# Patient Record
Sex: Male | Born: 1940
Health system: Southern US, Community
[De-identification: ages and names within clinical notes are randomized; demographics above are authoritative.]

## PROBLEM LIST (undated history)

## (undated) DIAGNOSIS — T8859XA Other complications of anesthesia, initial encounter: Secondary | ICD-10-CM

## (undated) DIAGNOSIS — E785 Hyperlipidemia, unspecified: Secondary | ICD-10-CM

## (undated) DIAGNOSIS — Z9981 Dependence on supplemental oxygen: Secondary | ICD-10-CM

## (undated) DIAGNOSIS — J310 Chronic rhinitis: Secondary | ICD-10-CM

## (undated) DIAGNOSIS — J449 Chronic obstructive pulmonary disease, unspecified: Secondary | ICD-10-CM

## (undated) DIAGNOSIS — J342 Deviated nasal septum: Secondary | ICD-10-CM

## (undated) DIAGNOSIS — R351 Nocturia: Secondary | ICD-10-CM

## (undated) DIAGNOSIS — R0600 Dyspnea, unspecified: Secondary | ICD-10-CM

## (undated) DIAGNOSIS — G4733 Obstructive sleep apnea (adult) (pediatric): Principal | ICD-10-CM

## (undated) DIAGNOSIS — I10 Essential (primary) hypertension: Secondary | ICD-10-CM

## (undated) DIAGNOSIS — M1990 Unspecified osteoarthritis, unspecified site: Secondary | ICD-10-CM

## (undated) DIAGNOSIS — C4490 Unspecified malignant neoplasm of skin, unspecified: Secondary | ICD-10-CM

## (undated) HISTORY — DX: Obstructive sleep apnea (adult) (pediatric): G47.33

## (undated) HISTORY — DX: Chronic obstructive pulmonary disease, unspecified: J44.9

## (undated) HISTORY — DX: Chronic rhinitis: J31.0

## (undated) HISTORY — PX: APPENDECTOMY: SHX54

## (undated) HISTORY — DX: Essential (primary) hypertension: I10

## (undated) HISTORY — PX: BACK SURGERY: SHX140

## (undated) HISTORY — DX: Deviated nasal septum: J34.2

## (undated) HISTORY — DX: Unspecified malignant neoplasm of skin, unspecified: C44.90

## (undated) HISTORY — DX: Hyperlipidemia, unspecified: E78.5

## (undated) HISTORY — DX: Unspecified osteoarthritis, unspecified site: M19.90

---

## 1999-04-08 ENCOUNTER — Encounter: Payer: Self-pay | Admitting: Internal Medicine

## 1999-04-08 ENCOUNTER — Ambulatory Visit (HOSPITAL_COMMUNITY): Admission: RE | Admit: 1999-04-08 | Discharge: 1999-04-08 | Payer: Self-pay | Admitting: Internal Medicine

## 2002-02-22 ENCOUNTER — Ambulatory Visit (HOSPITAL_BASED_OUTPATIENT_CLINIC_OR_DEPARTMENT_OTHER): Admission: RE | Admit: 2002-02-22 | Discharge: 2002-02-22 | Payer: Self-pay | Admitting: Surgery

## 2004-08-13 ENCOUNTER — Ambulatory Visit: Payer: Self-pay | Admitting: Internal Medicine

## 2004-08-28 ENCOUNTER — Ambulatory Visit: Payer: Self-pay | Admitting: Internal Medicine

## 2004-08-29 ENCOUNTER — Ambulatory Visit: Payer: Self-pay | Admitting: Internal Medicine

## 2005-06-12 ENCOUNTER — Emergency Department (HOSPITAL_COMMUNITY): Admission: EM | Admit: 2005-06-12 | Discharge: 2005-06-12 | Payer: Self-pay | Admitting: Emergency Medicine

## 2005-06-16 ENCOUNTER — Emergency Department (HOSPITAL_COMMUNITY): Admission: EM | Admit: 2005-06-16 | Discharge: 2005-06-16 | Payer: Self-pay | Admitting: Emergency Medicine

## 2005-06-22 ENCOUNTER — Emergency Department (HOSPITAL_COMMUNITY): Admission: EM | Admit: 2005-06-22 | Discharge: 2005-06-22 | Payer: Self-pay | Admitting: Emergency Medicine

## 2005-09-16 ENCOUNTER — Ambulatory Visit: Payer: Self-pay | Admitting: Family Medicine

## 2005-10-02 ENCOUNTER — Ambulatory Visit: Payer: Self-pay | Admitting: Internal Medicine

## 2005-10-27 ENCOUNTER — Ambulatory Visit (HOSPITAL_BASED_OUTPATIENT_CLINIC_OR_DEPARTMENT_OTHER): Admission: RE | Admit: 2005-10-27 | Discharge: 2005-10-27 | Payer: Self-pay | Admitting: General Surgery

## 2005-10-27 ENCOUNTER — Encounter (INDEPENDENT_AMBULATORY_CARE_PROVIDER_SITE_OTHER): Payer: Self-pay | Admitting: *Deleted

## 2006-02-03 ENCOUNTER — Ambulatory Visit: Payer: Self-pay | Admitting: Internal Medicine

## 2006-09-29 ENCOUNTER — Ambulatory Visit: Payer: Self-pay | Admitting: Internal Medicine

## 2006-10-20 ENCOUNTER — Ambulatory Visit: Payer: Self-pay | Admitting: Internal Medicine

## 2006-11-09 ENCOUNTER — Ambulatory Visit: Payer: Self-pay | Admitting: Internal Medicine

## 2008-01-25 ENCOUNTER — Emergency Department (HOSPITAL_COMMUNITY): Admission: EM | Admit: 2008-01-25 | Discharge: 2008-01-25 | Payer: Self-pay | Admitting: Emergency Medicine

## 2008-03-09 LAB — HM COLONOSCOPY

## 2009-06-07 ENCOUNTER — Ambulatory Visit: Payer: Self-pay | Admitting: Cardiovascular Disease

## 2009-06-07 DIAGNOSIS — I10 Essential (primary) hypertension: Secondary | ICD-10-CM | POA: Insufficient documentation

## 2009-06-26 ENCOUNTER — Ambulatory Visit: Payer: Self-pay

## 2009-06-26 ENCOUNTER — Encounter: Payer: Self-pay | Admitting: Cardiovascular Disease

## 2009-06-26 ENCOUNTER — Ambulatory Visit: Payer: Self-pay | Admitting: Internal Medicine

## 2009-06-29 ENCOUNTER — Telehealth: Payer: Self-pay | Admitting: Cardiovascular Disease

## 2009-06-29 ENCOUNTER — Encounter: Payer: Self-pay | Admitting: Cardiovascular Disease

## 2009-07-05 ENCOUNTER — Ambulatory Visit: Payer: Self-pay | Admitting: Pulmonary Disease

## 2009-07-05 DIAGNOSIS — J439 Emphysema, unspecified: Secondary | ICD-10-CM | POA: Insufficient documentation

## 2009-08-02 ENCOUNTER — Ambulatory Visit: Payer: Self-pay | Admitting: Pulmonary Disease

## 2009-08-02 DIAGNOSIS — J310 Chronic rhinitis: Secondary | ICD-10-CM | POA: Insufficient documentation

## 2009-08-28 ENCOUNTER — Encounter: Payer: Self-pay | Admitting: Pulmonary Disease

## 2009-09-18 ENCOUNTER — Ambulatory Visit: Payer: Self-pay | Admitting: Pulmonary Disease

## 2009-10-03 ENCOUNTER — Ambulatory Visit: Payer: Self-pay | Admitting: Pulmonary Disease

## 2009-10-03 DIAGNOSIS — R490 Dysphonia: Secondary | ICD-10-CM | POA: Insufficient documentation

## 2010-11-07 ENCOUNTER — Ambulatory Visit: Payer: Self-pay | Admitting: Pulmonary Disease

## 2011-01-02 NOTE — Assessment & Plan Note (Signed)
Summary: ROV PER PT'S SISTER///KP   Visit Type:  Follow-up Copy to:  [name]/Referring Melissa Tomaselli:  [facility]--Dr. Durwin Nora  CC:  COPD...pt c/o incr SOB w/ exertion...prod cough w/ yellow sputum...wheezing...sinus drainage...pt has been off all inahlers for >6 months.  History of Present Illness: 70 yo male with  COPD and rhinitis.  He has been feeling more short of breath.  He has more cough with clear to yellow sputum.  He has been getting some wheezing.  He denies fever or hemoptysis.  He was not able to tolerate spiriva or symbicort because these caused throat irritation.  He has been using proair and this helps.  He was recent treated with antibiotics for a sinus infection.  Preventive Screening-Counseling & Management  Alcohol-Tobacco     Smoking Status: quit  Current Medications (verified): 1)  No Daily Medications  Allergies (verified): 1)  ! Pcn 2)  ! Asa 3)  ! * Amlodipine  Past History:  Past Medical History: Reviewed history from 10/03/2009 and no changes required. COPD      - PFT 06/26/09 FEV1 1.75(71%), FVC 3.65(101%), FEV1% 48, TLC 5.53(104%), DLCO 55%, no BD Athritis Skin Cancer Hypertension Nasal septal deviation Chronic rhinitis  Past Surgical History: Reviewed history from 06/07/2009 and no changes required. Back Surgery 1980's Appendectomy  Vital Signs:  Patient profile:   70 year old male Height:      65 inches (165.10 cm) Weight:      164 pounds (74.55 kg) BMI:     27.39 O2 Sat:      94 % on Room air Temp:     97.6 degrees F (36.44 degrees C) oral Pulse rate:   59 / minute BP sitting:   150 / 88  (left arm) Cuff size:   regular  Vitals Entered By: Michel Bickers CMA (November 07, 2010 10:30 AM)  O2 Sat at Rest %:  94 O2 Flow:  Room air CC: COPD...pt c/o incr SOB w/ exertion...prod cough w/ yellow sputum...wheezing...sinus drainage...pt has been off all inahlers for >6 months Comments Medications  reviewed with patient Michel Bickers [facility]  November 07, 2010 10:31 AM   Physical Exam  General:  normal appearance and healthy appearing.   Nose:  clear nasal discharge and deviated septum.   Mouth:  edentulous, 1+ tonsills, no exudate Neck:  no JVD, no thyromegaly Lungs:  diminished breath sounds, no wheezing or rales Heart:  regular rate and rhythm, S1, S2 without murmurs, rubs, gallops, or clicks Extremities:  no clubbing, cyanosis, edema, or deformity noted Neurologic:  normal CN II-XII and strength normal.   Cervical Nodes:  no significant adenopathy Psych:  alert and cooperative; normal mood and affect; normal attention span and concentration   Impression & Recommendations:  Problem # 1:  COPD (ICD-496) He was not able to tolerate symbicort or spiriva.  Will try him on combivent as needed.  I don't think he needs additional antibiotics or prednisone at this time.  Problem # 2:  RHINITIS (ICD-472.0)  He is not on any therapy at present.  Advised him to resume nasal irrigation as needed.  Problem # 3:  HOARSENESS (AYT-016.01) Will monitor off symbicort and spiriva.  Monitor his sinus symptoms.  Hold off on therapy for reflux for now.  He can continue salt water gargles as needed.  Medications Added to Medication List This Visit: 1)  No Daily Medications  2)  Combivent 18-103 Mcg/act Aero (Ipratropium-albuterol) .... Two puffs four  times per day as needed  Complete Medication List: 1)  Combivent 18-103 Mcg/act Aero (Ipratropium-albuterol) .... Two puffs four times per day as needed  Other Orders: Est. Patient Level III (04540)  Patient Instructions: 1)  Combivent two puffs up to four times per day as needed 2)  Follow up in 4 months Prescriptions: COMBIVENT 18-103 MCG/ACT AERO (IPRATROPIUM-ALBUTEROL) two puffs four times per day as needed  #1 x 6   Entered and Authorized by:   Coralyn Helling MD   Signed by:   Coralyn Helling MD on 11/07/2010   Method used:   Electronically to          [facility] [phone]* (retail)       [address]       [place], [place]  [address]       Ph: [phone] or [phone]       Fax: [phone]   RxID:   [id number]

## 2011-02-26 ENCOUNTER — Encounter: Payer: Self-pay | Admitting: *Deleted

## 2011-02-26 ENCOUNTER — Ambulatory Visit (INDEPENDENT_AMBULATORY_CARE_PROVIDER_SITE_OTHER)
Admission: RE | Admit: 2011-02-26 | Discharge: 2011-02-26 | Disposition: A | Discharge status: Home / Self Care | Payer: Medicare Other | Source: Ambulatory Visit | Attending: Pulmonary Disease | Admitting: Pulmonary Disease

## 2011-02-26 ENCOUNTER — Ambulatory Visit (INDEPENDENT_AMBULATORY_CARE_PROVIDER_SITE_OTHER): Payer: Medicare Other | Admitting: Pulmonary Disease

## 2011-02-26 ENCOUNTER — Encounter: Payer: Self-pay | Admitting: Pulmonary Disease

## 2011-02-26 VITALS — BP 150/94 | HR 73 | Temp 98.1°F | Ht 65.0 in | Wt 155.4 lb

## 2011-02-26 DIAGNOSIS — J441 Chronic obstructive pulmonary disease with (acute) exacerbation: Secondary | ICD-10-CM

## 2011-02-26 DIAGNOSIS — J449 Chronic obstructive pulmonary disease, unspecified: Secondary | ICD-10-CM

## 2011-02-26 MED ORDER — PREDNISONE 10 MG PO TABS: ORAL_TABLET | ORAL | Status: DC

## 2011-02-26 NOTE — Progress Notes (Signed)
  Subjective:    Patient ID: DWON SKY, male    DOB: 09/27/1941, 70 y.o.   MRN: [id number]  HPI 68/M with COPD , gold stg 2 FEv1 70% & rhinits  02/26/2011 Increased sob x 2-3 weeks, cough with white cotton, thinks due to high pollen No preceding URI, fevers, sore throat, no sick contacts.     Review of Systems Pt denies any significant  nasal congestion or excess secretions, fever, chills, sweats, unintended wt loss, pleuritic or exertional cp, orthopnea pnd or leg swelling.  Pt also denies any obvious fluctuation in symptoms with weather or environmental change or other alleviating or aggravating factors.    Pt denies any increase in rescue therapy over baseline, denies waking up needing it or having early am exacerbations or coughing/wheezing/ or dyspnea       Objective:   Physical Exam Gen. Pleasant, thin male, in no distress, normal affect ENT - no lesions, no post nasal drip Neck: No JVD, no thyromegaly, no carotid bruits Lungs: no use of accessory muscles, no dullness to percussion, decreased without rales, faint exp rhonchi  Cardiovascular: Rhythm regular, heart sounds  normal, no murmurs or gallops, no peripheral edema Abdomen: soft and non-tender, no hepatosplenomegaly, BS normal. Musculoskeletal: No deformities, no cyanosis or clubbing Neuro:  alert, non focal         Assessment & Plan:

## 2011-02-26 NOTE — Patient Instructions (Signed)
Chest xray today Take prednisone 10 mg tabs - 2 tabs x 5 days, then 1 tab x 5 ds then STOP Take CLARITIN or ZYRTEC OTC  Daily x 2 months Symbicort 160 sample 2 puffs bid until over This should help reduce the frequency of your rescue inhaler (COMBIVENT) to 2 puffs as needed only

## 2011-02-27 ENCOUNTER — Encounter: Payer: Self-pay | Admitting: Pulmonary Disease

## 2011-02-27 NOTE — Assessment & Plan Note (Signed)
Flare related to pollen? Chest xray today Take prednisone 10 mg tabs - 2 tabs x 5 days, then 1 tab x 5 ds then STOP Take CLARITIN or ZYRTEC OTC  Daily x 2 months Symbicort 160 sample 2 puffs bid until over - Reassess in 4 weeks whether he would need LABA longer This should help reduce the frequency of your rescue inhaler

## 2011-03-28 ENCOUNTER — Ambulatory Visit (INDEPENDENT_AMBULATORY_CARE_PROVIDER_SITE_OTHER): Payer: Medicare Other | Admitting: Pulmonary Disease

## 2011-03-28 ENCOUNTER — Encounter: Payer: Self-pay | Admitting: Pulmonary Disease

## 2011-03-28 VITALS — BP 162/88 | HR 59 | Temp 97.7°F | Ht 65.0 in | Wt 156.6 lb

## 2011-03-28 DIAGNOSIS — J449 Chronic obstructive pulmonary disease, unspecified: Secondary | ICD-10-CM

## 2011-03-28 DIAGNOSIS — J310 Chronic rhinitis: Secondary | ICD-10-CM

## 2011-03-28 DIAGNOSIS — R490 Dysphonia: Secondary | ICD-10-CM

## 2011-03-28 NOTE — Progress Notes (Signed)
Subjective:    Patient ID: Michael Evans, male    DOB: 1941-11-25, 70 y.o.   MRN: [id number] Copy to: [name], Dr. [facility]  HPI 70 yo male with GOLD 2 COPD and rhinitis.  He finished his course of prednisone.  He is using symbicort twice per day.  He uses this at 12pm and 5pm.  He is using combivent 4 times per day.  This helps, but does not last.  His breathing is getting worse, especially at night.  He is having more cough and clear sputum.  He is wheezing at night.  He denies chest pain/tightness, hemoptysis.  He is not smoking.  He felt better when on prednisone.  He thinks his allergies are getting worse.  He is using OTC allergy medicine recommended by his pharmacist, and this has helped.  Past Medical History  Diagnosis Date  . COPD (chronic obstructive pulmonary disease)     PFT 06-26-09 FEV1  1.75( 71%), FVC 3.65( 101%), FEV1% 48, TLC 5.53(104%), DLCO 55%, no BD  . Arthritis   . Skin cancer   . Hypertension   . Nasal septal deviation   . Chronic rhinitis      Family History  Problem Relation Age of Onset  . Lung cancer Mother      History   Social History  . Marital Status: Divorced    Spouse Name: N/A    Number of Children: N/A  . 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 Smoking status: Former Smoker -- 1.5 packs/day for 54 years    Quit date: 12/01/2001  . Smokeless tobacco: Not on file  . Alcohol Use: No  . Drug Use: No  . Sexually Active: Not on file   Other Topics Concern  . Not on file   Social History Narrative  . No narrative on file     Allergies  Allergen Reactions  . Aspirin   . Penicillins   . Sulfa Antibiotics      Outpatient Prescriptions Prior to Visit  Medication Sig Dispense Refill  . albuterol-ipratropium (COMBIVENT) 18-103 MCG/ACT inhaler Inhale 2 puffs into the lungs every 6 (six) hours as needed.        . predniSONE (DELTASONE) 10 MG tablet 2  tablets by mouth once daily x 5 days, then 1 tablet by mouth x 5 days then stop  15 tablet  0      Review of Systems     Objective:   Physical Exam Filed Vitals:   03/28/11 0955 03/28/11 0956  BP:  162/88  Pulse:  59  Temp: 97.7 F (36.5 C)   TempSrc: Oral   Height: 5\' 5"  (1.651 m)   Weight: 156 lb 9.6 oz (71.033 kg)   SpO2:  94%   General - Thin, no distress HEENT - No sinus tenderness, clear nasal discharge, no oral lesion, no LAN Cardiac - s1s2 regular, no murmur Chest - diminished breath sound, no wheeze or rales Abd - soft, nontender Ext - no edema Neuro - normal strength, CN intact Psych - normal mood/behavior     Assessment & Plan:   COPD I am concerned part of his nocturnal dyspnea may be related to the timing of his inhaler use.  I explained that he is likely using symbicort too close together.  Advised him to use symbicort at 8am and 8pm.  Will also arrange for overnight oximetry  on room air.  RHINITIS Much of his current symptoms seem to be related to his sinuses.  Advised him to use nasal irrigation and prn anti-histamine.    Updated Medication List Outpatient Encounter Prescriptions as of 03/28/2011  Medication Sig Dispense Refill  . albuterol-ipratropium (COMBIVENT) 18-103 MCG/ACT inhaler Inhale 2 puffs into the lungs every 6 (six) hours as needed.        . budesonide-formoterol (SYMBICORT) 160-4.5 MCG/ACT inhaler Inhale 2 puffs into the lungs 2 (two) times daily.        . Chlorpheniramine-Phenylephrine (SINUS & ALLERGY PE MAX ST) 4-10 MG per tablet 1 tablet twice a day       . DISCONTD: predniSONE (DELTASONE) 10 MG tablet 2 tablets by mouth once daily x 5 days, then 1 tablet by mouth x 5 days then stop  15 tablet  0

## 2011-03-28 NOTE — Assessment & Plan Note (Signed)
I am concerned part of his nocturnal dyspnea may be related to the timing of his inhaler use.  I explained that he is likely using symbicort too close together.  Advised him to use symbicort at 8am and 8pm.  Will also arrange for overnight oximetry on room air.

## 2011-03-28 NOTE — Patient Instructions (Signed)
Use symbicort at 8 am and 8 pm Use nasal irrigation as needed for sinuses Will arrange oxygen test at night Follow up in 4 months

## 2011-03-28 NOTE — Assessment & Plan Note (Addendum)
Much of his current symptoms seem to be related to his sinuses.  Advised him to use nasal irrigation and prn anti-histamine.

## 2011-04-18 NOTE — Op Note (Signed)
[place]  Patient:    Michael Evans, Michael Evans Visit Number: [id number] MRN: [id number]          Service Type: DSU Location: [facility] Attending Physician:  Shelly Rubenstein Dictated by:   Abigail Miyamoto, M.D. Proc. Date: 02/22/02 Admit Date:  02/22/2002                             Operative Report  PREOPERATIVE DIAGNOSIS:  Skin lesion, right hand, right lower back.  POSTOPERATIVE DIAGNOSIS:  Skin lesion, right hand, right lower back.  OPERATION PERFORMED:  Excision of skin lesion, right hand and right lower back.  SURGEON:  Abigail Miyamoto, M.D.  ANESTHESIA:  1% lidocaine.  DESCRIPTION OF PROCEDURE:  Patient brought to operating room and identified as Michael Evans.  He was placed supine on the operating table and then placed in the prone position.  The skin of the back was then prepped and draped in the usual sterile fashion.  The skin overlying the lesion was anesthetized with 1% lidocaine.  A longitudinal incision was then made over the lesion and the lesion was completely excised.  It appeared to be consistent with a recurrent sebaceous cyst.  The entire cyst contents and capsule were completely removed. The wound was then irrigated and closed with two 3-0 nylon sutures.  The patient was then placed back in supine position.  His right hand was then prepped and draped in the usual sterile fashion.  The skin on the hand was then anesthetized with 1% lidocaine.  An elliptical incision was made around the 5 mm skin lesion.  The lesion was then completely excised in its entirety and sent to pathology for identification.  The wound was then closed with interrupted 3-0 nylon sutures.  A band-aid was then placed on both wounds. The patient tolerated the procedure well.  All sponge, needle and instrument counts were correct at the end of the procedure.  The patient was able to leave the operating room and go to the recovery room. Dictated by:   Abigail Miyamoto, M.D. Attending Physician:  Shelly Rubenstein DD:  02/22/02 TD:  02/22/02 Job: [id number]

## 2011-04-18 NOTE — Op Note (Signed)
Michael Evans, Michael Evans                 ACCOUNT NO.:  [id number]   MEDICAL RECORD NO.:  [id number]          PATIENT TYPE:  AMB   LOCATION:  DSC                          FACILITY:  [facility]   PHYSICIAN:  Ollen Gross. Vernell Morgans, M.D. DATE OF BIRTH:  1941/07/09   DATE OF PROCEDURE:  10/27/2005  DATE OF DISCHARGE:                                 OPERATIVE REPORT   PREOPERATIVE DIAGNOSIS:  Sebaceous cyst on the left low back.   POSTOPERATIVE DIAGNOSIS:  Sebaceous cyst on the left low back.   PROCEDURE:  Excision of 1-cm sebaceous cyst from the left low back.   SURGEON:  Ollen Gross. Carolynne Edouard, M.D.   ANESTHESIA:  Local.   PROCEDURE:  After informed consent was obtained, the patient was brought to  the operating room and placed in a prone position on the operating room  table.  The area in question on his left low back was then prepped with  Betadine and draped in the usual sterile manner.  The area was then  infiltrated with 1% lidocaine with epinephrine until a good field block was  created.  The sebaceous cyst was then excised in an elliptical fashion  sharply with a 15 blade knife full-thickness down into the subcutaneous  fatty tissue until the sebaceous cyst was completely removed.  No evidence  of cyst wall remained.  This was then sent to Pathology for further  evaluation.  The wound was then closed with interrupted 3-0 nylon stitches  and bacitracin, and sterile dressings were applied.  The area was completely  hemostatic at the end.  The patient tolerated well the procedure well.  All  needle, sponge and instrument counts were correct.  The patient was then  taken to recovery in stable condition.      Ollen Gross. Vernell Morgans, M.D.  Electronically Signed     PST/MEDQ  D:  10/27/2005  T:  10/28/2005  Job:  [id number]

## 2011-04-21 ENCOUNTER — Telehealth: Payer: Self-pay | Admitting: Pulmonary Disease

## 2011-04-21 DIAGNOSIS — G4733 Obstructive sleep apnea (adult) (pediatric): Secondary | ICD-10-CM

## 2011-04-21 DIAGNOSIS — G4710 Hypersomnia, unspecified: Secondary | ICD-10-CM

## 2011-04-21 HISTORY — DX: Obstructive sleep apnea (adult) (pediatric): G47.33

## 2011-04-21 NOTE — Telephone Encounter (Signed)
Overnight oximetry from 04/08/11>>Test time 10hrs .  Mean SpO2 91%, low 82%.  Spent 41 min with SpO2 < 88%.  Had REM related oxygen desaturation pattern.  He has sleep disruption, daytime fatigue, and snoring.  Has history of hypertension.  Explained he could have oxygen desaturation due to COPD and/or OSA.  To further assess will have [facility] contact him to schedule sleep test.

## 2011-04-29 ENCOUNTER — Encounter: Payer: Self-pay | Admitting: Pulmonary Disease

## 2011-05-20 ENCOUNTER — Ambulatory Visit (HOSPITAL_BASED_OUTPATIENT_CLINIC_OR_DEPARTMENT_OTHER): Payer: Medicare Other | Attending: Pulmonary Disease

## 2011-05-20 DIAGNOSIS — G4733 Obstructive sleep apnea (adult) (pediatric): Secondary | ICD-10-CM | POA: Insufficient documentation

## 2011-05-20 DIAGNOSIS — J449 Chronic obstructive pulmonary disease, unspecified: Secondary | ICD-10-CM | POA: Insufficient documentation

## 2011-05-20 DIAGNOSIS — R259 Unspecified abnormal involuntary movements: Secondary | ICD-10-CM | POA: Insufficient documentation

## 2011-05-20 DIAGNOSIS — G4710 Hypersomnia, unspecified: Secondary | ICD-10-CM

## 2011-05-20 DIAGNOSIS — J4489 Other specified chronic obstructive pulmonary disease: Secondary | ICD-10-CM | POA: Insufficient documentation

## 2011-05-26 ENCOUNTER — Telehealth: Payer: Self-pay | Admitting: Pulmonary Disease

## 2011-05-26 NOTE — Telephone Encounter (Signed)
Spoke with the patient sister and she states the pt had a sleep test done on 05-20-11. He is still having a lot of problems with trouble sleeping and the coughing at night. They want to know the results of sleep test. They are aware that VS will be in office on Wed.  Please advise. Carron Curie, CMA

## 2011-05-31 ENCOUNTER — Telehealth: Payer: Self-pay | Admitting: Pulmonary Disease

## 2011-05-31 DIAGNOSIS — J449 Chronic obstructive pulmonary disease, unspecified: Secondary | ICD-10-CM

## 2011-05-31 DIAGNOSIS — G4733 Obstructive sleep apnea (adult) (pediatric): Secondary | ICD-10-CM

## 2011-05-31 DIAGNOSIS — J4489 Other specified chronic obstructive pulmonary disease: Secondary | ICD-10-CM

## 2011-05-31 DIAGNOSIS — G4710 Hypersomnia, unspecified: Secondary | ICD-10-CM

## 2011-05-31 DIAGNOSIS — R259 Unspecified abnormal involuntary movements: Secondary | ICD-10-CM

## 2011-05-31 NOTE — Procedures (Addendum)
NAME:  ABDULKADIR, EMMANUEL NO.:  [id number]  MEDICAL RECORD NO.:  [id number]          PATIENT TYPE:  OUT  LOCATION:  SLEEP CENTER                 FACILITY[facility]  [facility]  PHYSICIAN:  Coralyn Helling, MD        DATE OF BIRTH:  1941-11-15  DATE OF STUDY:  05/20/2011                           NOCTURNAL POLYSOMNOGRAM  REFERRING PHYSICIAN:  Coralyn Helling, MD  INDICATION FOR STUDY:  [name] is a 71 year old male who has a history of COPD.  He also has snoring, sleep disruption, and daytime sleepiness. He had recently undergone an overnight oximetry and was noted to have significant oxygen desaturation with a REM related pattern.  He is therefore referred to the sleep lab for evaluation of hypersomnia with obstructive sleep apnea.  Height is 5 feet 5 inches, weight is 152 pounds.  BMI is 25.  Neck size is 15 inches.  MEDICATIONS:  Combivent.  EPWORTH SLEEPINESS SCORE:  0.  SLEEP ARCHITECTURE:  Total recording time was 452 minutes.  Total sleep time was 107 minutes.  Sleep efficiency was 23%.  Sleep latency is 44 minutes.  REM latency was 405 minutes.  The study was notable for lack of stage III sleep.  The patient slept in both the supine and nonsupine position.  Of note, the patient had difficulty with sleep initiation and sleep maintenance due to respiratory events.  RESPIRATORY DATA:  The average respiratory rate was 22.  Moderate snoring was noted by the technician.  The overall apnea-hypopnea index was 8.  The were 2 central apneic events.  The remainder of the events were obstructive in nature.  OXYGEN DATA:  The baseline oxygenation is 91%.  The oxygen saturation nadir of 87%.  The study was conducted without the use of supplemental oxygen.  CARDIAC DATA:  The average heart rate was 63 and the rhythm strip showed sinus rhythm with occasional PACs.  MOVEMENT-PARASOMNIA:  The pain had 2 restroom trips and the periodic limb movement index was 22.  IMPRESSION:  The  study shows evidence for mild obstructive sleep apnea with an apnea-hypopnea index of 8 and an oxygen saturation nadir of 87%.  He did have a significant increase in his periodic limb movement index and clinical correlation would be necessary to determine the significance of this.  Additional therapeutic options for his sleep disordered breathing could include CPAP therapy, oral appliance, or surgical intervention.     Coralyn Helling, MD Diplomat, American Board of Sleep Medicine Electronically Signed    VS/MEDQ  D:  05/30/2011 18:34:44  T:  05/31/2011 05:45:37  Job:  [id number]

## 2011-05-31 NOTE — Telephone Encounter (Signed)
PSG 05/20/11>>AHI 8, SpO2 low 87%, PLMI 22  Will have my nurse inform pt that sleep test showed mild sleep apnea, and schedule for ROV to discuss treatment options.

## 2011-06-03 NOTE — Telephone Encounter (Signed)
Called, spoke with pt's sister.  She was informed sleep test showed mild sleep apnea and VS recs pt come in for OV to discuss treatment options.  OV scheduled for first available - Aug 1 at 4:30 - pt's sister aware and verbalized understanding.

## 2011-07-02 ENCOUNTER — Encounter: Payer: Self-pay | Admitting: Pulmonary Disease

## 2011-07-02 ENCOUNTER — Ambulatory Visit (INDEPENDENT_AMBULATORY_CARE_PROVIDER_SITE_OTHER): Payer: Medicare Other | Admitting: Pulmonary Disease

## 2011-07-02 VITALS — BP 140/82 | HR 74 | Temp 97.4°F | Ht 64.0 in | Wt 158.0 lb

## 2011-07-02 DIAGNOSIS — J4489 Other specified chronic obstructive pulmonary disease: Secondary | ICD-10-CM

## 2011-07-02 DIAGNOSIS — J449 Chronic obstructive pulmonary disease, unspecified: Secondary | ICD-10-CM

## 2011-07-02 DIAGNOSIS — G4733 Obstructive sleep apnea (adult) (pediatric): Secondary | ICD-10-CM

## 2011-07-02 MED ORDER — IPRATROPIUM-ALBUTEROL 18-103 MCG/ACT IN AERO: 2.0000 | INHALATION_SPRAY | Freq: Four times a day (QID) | RESPIRATORY_TRACT | Status: DC | PRN

## 2011-07-02 NOTE — Assessment & Plan Note (Signed)
He has mild sleep apnea.  I have reviewed his sleep test results with the patient.  Explained how sleep apnea can affect the patient's health.  Driving precautions and importance of weight loss were discussed.  Treatment options for sleep apnea were reviewed.  Will proceed with in-lab titration study.  Will set up CPAP after, and then arrange for follow up visit.

## 2011-07-02 NOTE — Patient Instructions (Signed)
Will arrange for sleep test (CPAP titration) Will call to arrange follow up after sleep test reviewed

## 2011-07-02 NOTE — Progress Notes (Signed)
  Subjective:    Patient ID: Michael Evans, male    DOB: June 27, 1941, 70 y.o.   MRN: [id number]  HPI CC: [name], Dr. [facility]   70 yo male with GOLD 2 COPD and rhinitis.  He is here to follow up his sleep study from 05/20/11>>AHI 8, SpO2 low 87%, PLMI 22.  He has not been using symbicort.  He is not sure how much it helped, and was worried about the cost.  He is using combivent 4 times per day.  He gets occasional cough and chest congestion, more at night.  He is not having wheezing, sputum, chest pain, or hemoptysis.  Review of Systems     Objective:   Physical Exam BP 140/82  Pulse 74  Temp(Src) 97.4 F (36.3 C) (Oral)  Ht 5\' 4"  (1.626 m)  Wt 158 lb (71.668 kg)  BMI 27.12 kg/m2  SpO2 95%  General - Thin, no distress  HEENT - No sinus tenderness, clear nasal discharge, no oral lesion, no LAN  Cardiac - s1s2 regular, no murmur  Chest - diminished breath sound, no wheeze or rales  Abd - soft, nontender  Ext - no edema  Neuro - normal strength, CN intact  Psych - normal mood/behavior     Assessment & Plan:   OSA (obstructive sleep apnea) He has mild sleep apnea.  I have reviewed his sleep test results with the patient.  Explained how sleep apnea can affect the patient's health.  Driving precautions and importance of weight loss were discussed.  Treatment options for sleep apnea were reviewed.  Will proceed with in-lab titration study.  Will set up CPAP after, and then arrange for follow up visit.  COPD He is to continue with combivent as needed.    Updated Medication List Outpatient Encounter Prescriptions as of 07/02/2011  Medication Sig Dispense Refill  . albuterol-ipratropium (COMBIVENT) 18-103 MCG/ACT inhaler Inhale 2 puffs into the lungs every 6 (six) hours as needed.  1 Inhaler  11  . Chlorpheniramine-Phenylephrine (SINUS & ALLERGY PE MAX ST) 4-10 MG per tablet 1 tablet twice a day       . DISCONTD: albuterol-ipratropium (COMBIVENT)  18-103 MCG/ACT inhaler Inhale 2 puffs into the lungs every 6 (six) hours as needed.        Marland Kitchen DISCONTD: budesonide-formoterol (SYMBICORT) 160-4.5 MCG/ACT inhaler Inhale 2 puffs into the lungs 2 (two) times daily.

## 2011-07-02 NOTE — Assessment & Plan Note (Addendum)
He is to continue with combivent as needed.

## 2011-07-06 ENCOUNTER — Ambulatory Visit (HOSPITAL_BASED_OUTPATIENT_CLINIC_OR_DEPARTMENT_OTHER): Payer: Medicare Other | Attending: Pulmonary Disease

## 2011-07-06 DIAGNOSIS — J4489 Other specified chronic obstructive pulmonary disease: Secondary | ICD-10-CM | POA: Insufficient documentation

## 2011-07-06 DIAGNOSIS — G4733 Obstructive sleep apnea (adult) (pediatric): Secondary | ICD-10-CM

## 2011-07-06 DIAGNOSIS — J449 Chronic obstructive pulmonary disease, unspecified: Secondary | ICD-10-CM | POA: Insufficient documentation

## 2011-07-21 ENCOUNTER — Ambulatory Visit (INDEPENDENT_AMBULATORY_CARE_PROVIDER_SITE_OTHER): Payer: Medicare Other | Admitting: Pulmonary Disease

## 2011-07-21 ENCOUNTER — Encounter: Payer: Self-pay | Admitting: Pulmonary Disease

## 2011-07-21 DIAGNOSIS — J4489 Other specified chronic obstructive pulmonary disease: Secondary | ICD-10-CM

## 2011-07-21 DIAGNOSIS — J449 Chronic obstructive pulmonary disease, unspecified: Secondary | ICD-10-CM

## 2011-07-21 DIAGNOSIS — G4733 Obstructive sleep apnea (adult) (pediatric): Secondary | ICD-10-CM

## 2011-07-21 NOTE — Assessment & Plan Note (Signed)
Will call pt with results of CPAP titration.

## 2011-07-21 NOTE — Progress Notes (Signed)
Appt was scheduled in error.  Will cancel visit.

## 2011-07-21 NOTE — Assessment & Plan Note (Signed)
He is to continue with combivent as needed. 

## 2011-07-21 NOTE — Patient Instructions (Signed)
Will call with results of sleep tests, and then schedule follow up after sleep test reviewed

## 2011-07-21 NOTE — Progress Notes (Deleted)
  Subjective:    Patient ID: Michael Evans, male    DOB: [birth year], 70 y.o.   MRN: [id number]  HPI CC: Michael Evans, Dr. Ilsa [facility]  70 yo male with GOLD 2 COPD and rhinitis     Review of Systems     Objective:   Physical Exam  BP 140/90  Pulse 67  Temp(Src) 98.7 F (37.1 C) (Oral)  Ht 5\' 4"  (1.626 m)  Wt 155 lb 9.6 oz (70.58 kg)  BMI 26.71 kg/m2  SpO2 97%         Assessment & Plan:

## 2011-07-22 ENCOUNTER — Telehealth: Payer: Self-pay | Admitting: Pulmonary Disease

## 2011-07-22 DIAGNOSIS — G4733 Obstructive sleep apnea (adult) (pediatric): Secondary | ICD-10-CM

## 2011-07-22 NOTE — Procedures (Addendum)
NAME:  Michael Evans, Michael Evans NO.:  [id number]  MEDICAL RECORD NO.:  [id number]          PATIENT TYPE:  OUT  LOCATION:  SLEEP CENTER                 FACILITY:  [facility]  PHYSICIAN:  Coralyn Helling, MD        DATE OF BIRTH:  Dec 06, 1940  DATE OF STUDY:  07/06/2011                           NOCTURNAL POLYSOMNOGRAM  REFERRING PHYSICIAN:  Coralyn Helling, MD  INDICATION FOR STUDY:  [name] is a 70 year old male who has a history of COPD.  He had recently undergone an overnight oximetry, which showed significant oxygen saturation.  He therefore was referred to Sleep Lab to have an overnight polysomnogram done, which was done on May 20, 2011.  He was found to have mild obstructive sleep apnea with an apnea/hypopnea index of 8.  He therefore was referred back to the Sleep Lab for a CPAP titration study.  Height is 5 feet and 5 inches, weight is 152 pounds.  BMI is 25.  Neck size is 15 inches.  EPWORTH SLEEPINESS SCORE:  0.  MEDICATIONS:  Combivent.  SLEEP ARCHITECTURE:  Total recording time was 490 minutes.  Total sleep time was 31 minutes.  Sleep efficiency was 70%.  Sleep latency was 144 minutes.  The study was notable for lack of stage III sleep or REM sleep.  He slept exclusively in the supine position.  RESPIRATORY DATA:  The average respiratory rate was 22.  Moderate snoring was noted by the technician.  The patient was started on CPAP of 5 and increased to 7 cm of water.  However, unfortunately, he had minimal amount of sleep time, therefore it was done possibly to determine efficacy of CPAP therapy.  Of note is that he had several episodes of coughing, which aroused him from sleep.  OXYGEN DATA:  The baseline oxygenation was 92%.  The oxygen saturation nadir was 86%.  The study was conducted without the use of supplemental oxygen.  CARDIAC DATA:  The average heart rate was 57.  The rhythm strip showed normal sinus rhythm with occasional PACs.  MOVEMENT-PARASOMNIA:   The patient had two restroom trips and the periodic limb movement index was 23.  IMPRESSIONS-RECOMMENDATIONS:  This was a suboptimal titration study due to insufficient sleep time for the patient.  He did have an increase in his periodic limb movement index and clinical correlation will be necessary to determine significant for this.  At this time, would be to arrange for auto CPAP at home for the patient or have him return to the Sleep Lab while using a sleep aid to ensure adequate sleep time.     Coralyn Helling, MD Diplomat, American Board of Sleep Medicine Electronically Signed    VS/MEDQ  D:  07/21/2011 19:07:15  T:  07/22/2011 02:55:54  Job:  [id number]

## 2011-07-22 NOTE — Telephone Encounter (Signed)
CPAP titration 07/06/11>>Had only 31 min sleep time during titration study.  Discussed results with pt's sister.  Will proceed with auto CPAP (I have sent order to [facility]).  If unsuccessful, he would need repeat in lab titration while using sleep aide.  Will have my nurse schedule ROV in 2 months.

## 2011-07-29 NOTE — Telephone Encounter (Signed)
Pt is coming in for his 2 month OV 10/18 at 9 am

## 2011-08-01 ENCOUNTER — Telehealth: Payer: Self-pay | Admitting: Pulmonary Disease

## 2011-08-01 NOTE — Telephone Encounter (Signed)
Noted  

## 2011-08-01 NOTE — Telephone Encounter (Signed)
Spoke with pt's sister. She is concerned that pt has not received his CPAP yet and [facility] keeps cancelling on him. I called [facility] and spoke with Sherice. She states that the pt was visited yesterday by rep from [facility] and he lives in a camper and does not have any electricity and therefore can not use CPAP at his home. I spoke with sister and advised that this is what the problem is and she verbalized understanding and states will try to help pt with this issue. Will forward to VS to be aware.

## 2011-08-22 LAB — CBC
HCT: 44.5
Hemoglobin: 15.1
MCHC: 34
MCV: 88.6
Platelets: 302
RBC: 5.02
RDW: 13.4
WBC: 10

## 2011-08-22 LAB — I-STAT 8, (EC8 V) (CONVERTED LAB)
BUN: 7
Bicarbonate: 25.5 — ABNORMAL HIGH
Chloride: 104
HCT: 47
Hemoglobin: 16
Operator id: 277751
Sodium: 136

## 2011-08-22 LAB — DIFFERENTIAL
Basophils Absolute: 0.1
Basophils Relative: 1
Eosinophils Absolute: 0
Eosinophils Relative: 1
Lymphocytes Relative: 16
Lymphs Abs: 1.6
Monocytes Absolute: 0.5
Monocytes Relative: 5
Neutro Abs: 7.7
Neutrophils Relative %: 77

## 2011-09-09 ENCOUNTER — Other Ambulatory Visit: Payer: Self-pay | Admitting: Physician Assistant

## 2011-09-09 DIAGNOSIS — R200 Anesthesia of skin: Secondary | ICD-10-CM

## 2011-09-13 ENCOUNTER — Ambulatory Visit
Admission: RE | Admit: 2011-09-13 | Discharge: 2011-09-13 | Disposition: A | Discharge status: Home / Self Care | Payer: Medicare Other | Source: Ambulatory Visit | Attending: Physician Assistant | Admitting: Physician Assistant

## 2011-09-13 DIAGNOSIS — R200 Anesthesia of skin: Secondary | ICD-10-CM

## 2011-09-18 ENCOUNTER — Encounter: Payer: Self-pay | Admitting: Pulmonary Disease

## 2011-09-18 ENCOUNTER — Ambulatory Visit (INDEPENDENT_AMBULATORY_CARE_PROVIDER_SITE_OTHER): Payer: Medicare Other | Admitting: Pulmonary Disease

## 2011-09-18 DIAGNOSIS — G4733 Obstructive sleep apnea (adult) (pediatric): Secondary | ICD-10-CM

## 2011-09-18 DIAGNOSIS — J449 Chronic obstructive pulmonary disease, unspecified: Secondary | ICD-10-CM

## 2011-09-18 NOTE — Progress Notes (Signed)
  Subjective:    Patient ID: Michael Evans, male    DOB: February 04, 1941, 70 y.o.   MRN: [id number]  HPI CC: Michael Evans, Dr. Ilsa Iha [facility]  70 yo male with GOLD 2 COPD and rhinitis.  He is unable to get CPAP set up.  He only has solar power in his mobile home, and was told this is not enough to run CPAP.  He does not fell like he is having too much trouble sleeping.  His breathing is about the same.  He is using combivent 3 or 4 times per day.  He is able to keep up with his daily activities reasonably well.  Review of Systems     Objective:   Physical Exam  BP 152/88  Pulse 48  Temp(Src) 97.6 F (36.4 C) (Oral)  Ht 5\' 4"  (1.626 m)  Wt 156 lb (70.761 kg)  BMI 26.78 kg/m2  SpO2 96%  General - Thin, no distress  HEENT - No sinus tenderness, clear nasal discharge, no oral lesion, no LAN  Cardiac - s1s2 regular, no murmur  Chest - diminished breath sound, no wheeze or rales  Abd - soft, nontender  Ext - no edema  Neuro - normal strength, CN intact  Psych - normal mood/behavior     Assessment & Plan:   COPD He is to continue combivent as needed.  He declined flu shot today.  OSA (obstructive sleep apnea) He will defer CPAP set up for now.    Updated Medication List Outpatient Encounter Prescriptions as of 09/18/2011  Medication Sig Dispense Refill  . albuterol-ipratropium (COMBIVENT) 18-103 MCG/ACT inhaler Inhale 2 puffs into the lungs every 6 (six) hours as needed.  1 Inhaler  11

## 2011-09-18 NOTE — Assessment & Plan Note (Signed)
He will defer CPAP set up for now.

## 2011-09-18 NOTE — Patient Instructions (Signed)
Follow up in 6 months 

## 2011-09-18 NOTE — Assessment & Plan Note (Signed)
He is to continue combivent as needed.  He declined flu shot today.

## 2011-09-24 ENCOUNTER — Encounter: Payer: Self-pay | Admitting: Pulmonary Disease

## 2012-03-04 DIAGNOSIS — J309 Allergic rhinitis, unspecified: Secondary | ICD-10-CM | POA: Diagnosis not present

## 2012-03-04 DIAGNOSIS — J019 Acute sinusitis, unspecified: Secondary | ICD-10-CM | POA: Diagnosis not present

## 2012-03-09 DIAGNOSIS — J019 Acute sinusitis, unspecified: Secondary | ICD-10-CM | POA: Diagnosis not present

## 2012-03-09 DIAGNOSIS — R51 Headache: Secondary | ICD-10-CM | POA: Diagnosis not present

## 2012-04-05 DIAGNOSIS — R51 Headache: Secondary | ICD-10-CM | POA: Diagnosis not present

## 2012-04-07 ENCOUNTER — Other Ambulatory Visit: Payer: Self-pay | Admitting: Family Medicine

## 2012-04-07 DIAGNOSIS — G44009 Cluster headache syndrome, unspecified, not intractable: Secondary | ICD-10-CM

## 2012-04-09 ENCOUNTER — Ambulatory Visit
Admission: RE | Admit: 2012-04-09 | Discharge: 2012-04-09 | Disposition: A | Discharge status: Home / Self Care | Payer: Medicare Other | Source: Ambulatory Visit | Attending: Family Medicine | Admitting: Family Medicine

## 2012-04-09 DIAGNOSIS — R51 Headache: Secondary | ICD-10-CM | POA: Diagnosis not present

## 2012-04-09 DIAGNOSIS — J329 Chronic sinusitis, unspecified: Secondary | ICD-10-CM | POA: Diagnosis not present

## 2012-04-09 DIAGNOSIS — G44009 Cluster headache syndrome, unspecified, not intractable: Secondary | ICD-10-CM

## 2012-04-13 DIAGNOSIS — R51 Headache: Secondary | ICD-10-CM | POA: Diagnosis not present

## 2012-04-14 ENCOUNTER — Other Ambulatory Visit: Payer: Self-pay | Admitting: Family Medicine

## 2012-04-14 DIAGNOSIS — R51 Headache: Secondary | ICD-10-CM

## 2012-04-17 ENCOUNTER — Other Ambulatory Visit: Payer: Medicare Other

## 2012-04-23 ENCOUNTER — Ambulatory Visit
Admission: RE | Admit: 2012-04-23 | Discharge: 2012-04-23 | Disposition: A | Discharge status: Home / Self Care | Payer: Medicare Other | Source: Ambulatory Visit | Attending: Family Medicine | Admitting: Family Medicine

## 2012-04-23 DIAGNOSIS — R51 Headache: Secondary | ICD-10-CM | POA: Diagnosis not present

## 2012-09-15 DIAGNOSIS — R51 Headache: Secondary | ICD-10-CM | POA: Diagnosis not present

## 2012-10-18 DIAGNOSIS — M25559 Pain in unspecified hip: Secondary | ICD-10-CM | POA: Diagnosis not present

## 2012-10-18 DIAGNOSIS — R51 Headache: Secondary | ICD-10-CM | POA: Diagnosis not present

## 2012-10-18 DIAGNOSIS — L570 Actinic keratosis: Secondary | ICD-10-CM | POA: Diagnosis not present

## 2012-11-03 DIAGNOSIS — H2589 Other age-related cataract: Secondary | ICD-10-CM | POA: Diagnosis not present

## 2012-11-03 DIAGNOSIS — H52229 Regular astigmatism, unspecified eye: Secondary | ICD-10-CM | POA: Diagnosis not present

## 2012-11-03 DIAGNOSIS — H524 Presbyopia: Secondary | ICD-10-CM | POA: Diagnosis not present

## 2012-11-03 DIAGNOSIS — H2510 Age-related nuclear cataract, unspecified eye: Secondary | ICD-10-CM | POA: Diagnosis not present

## 2012-11-18 DIAGNOSIS — L570 Actinic keratosis: Secondary | ICD-10-CM | POA: Diagnosis not present

## 2012-12-20 DIAGNOSIS — H35369 Drusen (degenerative) of macula, unspecified eye: Secondary | ICD-10-CM | POA: Diagnosis not present

## 2012-12-20 DIAGNOSIS — H2589 Other age-related cataract: Secondary | ICD-10-CM | POA: Diagnosis not present

## 2012-12-20 DIAGNOSIS — H547 Unspecified visual loss: Secondary | ICD-10-CM | POA: Diagnosis not present

## 2012-12-23 ENCOUNTER — Encounter (HOSPITAL_COMMUNITY): Payer: Self-pay | Admitting: Pharmacy Technician

## 2012-12-28 ENCOUNTER — Encounter (HOSPITAL_COMMUNITY)
Admission: RE | Admit: 2012-12-28 | Discharge: 2012-12-28 | Disposition: A | Discharge status: Home / Self Care | Payer: Medicare Other | Source: Ambulatory Visit | Attending: Ophthalmology | Admitting: Ophthalmology

## 2012-12-28 ENCOUNTER — Other Ambulatory Visit: Payer: Self-pay

## 2012-12-28 ENCOUNTER — Encounter (HOSPITAL_COMMUNITY): Payer: Self-pay

## 2012-12-28 DIAGNOSIS — I10 Essential (primary) hypertension: Secondary | ICD-10-CM | POA: Diagnosis not present

## 2012-12-28 DIAGNOSIS — H2589 Other age-related cataract: Secondary | ICD-10-CM | POA: Diagnosis not present

## 2012-12-28 DIAGNOSIS — J449 Chronic obstructive pulmonary disease, unspecified: Secondary | ICD-10-CM | POA: Diagnosis not present

## 2012-12-28 DIAGNOSIS — Z79899 Other long term (current) drug therapy: Secondary | ICD-10-CM | POA: Diagnosis not present

## 2012-12-28 DIAGNOSIS — Z01810 Encounter for preprocedural cardiovascular examination: Secondary | ICD-10-CM | POA: Diagnosis not present

## 2012-12-28 DIAGNOSIS — Z01812 Encounter for preprocedural laboratory examination: Secondary | ICD-10-CM | POA: Diagnosis not present

## 2012-12-28 HISTORY — DX: Nocturia: R35.1

## 2012-12-28 LAB — BASIC METABOLIC PANEL
BUN: 11 mg/dL (ref 6–23)
GFR calc Af Amer: 77 mL/min — ABNORMAL LOW (ref >90)
GFR calc non Af Amer: 67 mL/min — ABNORMAL LOW (ref >90)
Potassium: 4.8 mEq/L (ref 3.5–5.1)
Sodium: 138 mEq/L (ref 135–145)

## 2012-12-28 NOTE — Patient Instructions (Addendum)
Your procedure is scheduled on: 01/06/2013  Report to [facility] at  1120     AM.  Call this number if you have problems the morning of surgery: [phone]   Do not eat food or drink liquids :After Midnight.      Take these medicines the morning of surgery with A SIP OF WATER: none   Do not wear jewelry, make-up or nail polish.  Do not wear lotions, powders, or perfumes.   Do not shave 48 hours prior to surgery.  Do not bring valuables to the hospital.  Contacts, dentures or bridgework may not be worn into surgery.  Leave suitcase in the car. After surgery it may be brought to your room.  For patients admitted to the hospital, checkout time is 11:00 AM the day of discharge.   Patients discharged the day of surgery will not be allowed to drive home.  :     Please read over the following fact sheets that you were given: Coughing and Deep Breathing, Surgical Site Infection Prevention, Anesthesia Post-op Instructions and Care and Recovery After Surgery    Cataract A cataract is a clouding of the lens of the eye. When a lens becomes cloudy, vision is reduced based on the degree and nature of the clouding. Many cataracts reduce vision to some degree. Some cataracts make people more near-sighted as they develop. Other cataracts increase glare. Cataracts that are ignored and become worse can sometimes look white. The white color can be seen through the pupil. CAUSES   Aging. However, cataracts may occur at any age, even in newborns.   Certain drugs.   Trauma to the eye.   Certain diseases such as diabetes.   Specific eye diseases such as chronic inflammation inside the eye or a sudden attack of a rare form of glaucoma.   Inherited or acquired medical problems.  SYMPTOMS   Gradual, progressive drop in vision in the affected eye.   Severe, rapid visual loss. This most often happens when trauma is the cause.  DIAGNOSIS  To detect a cataract, an eye doctor examines the lens. Cataracts  are best diagnosed with an exam of the eyes with the pupils enlarged (dilated) by drops.  TREATMENT  For an early cataract, vision may improve by using different eyeglasses or stronger lighting. If that does not help your vision, surgery is the only effective treatment. A cataract needs to be surgically removed when vision loss interferes with your everyday activities, such as driving, reading, or watching TV. A cataract may also have to be removed if it prevents examination or treatment of another eye problem. Surgery removes the cloudy lens and usually replaces it with a substitute lens (intraocular lens, IOL).  At a time when both you and your doctor agree, the cataract will be surgically removed. If you have cataracts in both eyes, only one is usually removed at a time. This allows the operated eye to heal and be out of danger from any possible problems after surgery (such as infection or poor wound healing). In rare cases, a cataract may be doing damage to your eye. In these cases, your caregiver may advise surgical removal right away. The vast majority of people who have cataract surgery have better vision afterward. HOME CARE INSTRUCTIONS  If you are not planning surgery, you may be asked to do the following:  Use different eyeglasses.   Use stronger or brighter lighting.   Ask your eye doctor about reducing your medicine dose or  changing medicines if it is thought that a medicine caused your cataract. Changing medicines does not make the cataract go away on its own.   Become familiar with your surroundings. Poor vision can lead to injury. Avoid bumping into things on the affected side. You are at a higher risk for tripping or falling.   Exercise extreme care when driving or operating machinery.   Wear sunglasses if you are sensitive to bright light or experiencing problems with glare.  SEEK IMMEDIATE MEDICAL CARE IF:   You have a worsening or sudden vision loss.   You notice redness,  swelling, or increasing pain in the eye.   You have a fever.  Document Released: 11/17/2005 Document Revised: 11/06/2011 Document Reviewed: 07/11/2011 [facility] Patient Information 2012 [place].PATIENT INSTRUCTIONS POST-ANESTHESIA  IMMEDIATELY FOLLOWING SURGERY:  Do not drive or operate machinery for the first twenty four hours after surgery.  Do not make any important decisions for twenty four hours after surgery or while taking narcotic pain medications or sedatives.  If you develop intractable nausea and vomiting or a severe headache please notify your doctor immediately.  FOLLOW-UP:  Please make an appointment with your surgeon as instructed. You do not need to follow up with anesthesia unless specifically instructed to do so.  WOUND CARE INSTRUCTIONS (if applicable):  Keep a dry clean dressing on the anesthesia/puncture wound site if there is drainage.  Once the wound has quit draining you may leave it open to air.  Generally you should leave the bandage intact for twenty four hours unless there is drainage.  If the epidural site drains for more than 36-48 hours please call the anesthesia department.  QUESTIONS?:  Please feel free to call your physician or the hospital operator if you have any questions, and they will be happy to assist you.

## 2013-01-05 MED ORDER — LIDOCAINE HCL (PF) 1 % IJ SOLN
INTRAMUSCULAR | Status: AC
  Filled 2013-01-05: qty 2

## 2013-01-05 MED ORDER — PHENYLEPHRINE HCL 2.5 % OP SOLN
OPHTHALMIC | Status: AC
  Filled 2013-01-05: qty 2

## 2013-01-05 MED ORDER — TETRACAINE HCL 0.5 % OP SOLN
OPHTHALMIC | Status: AC
  Filled 2013-01-05: qty 2

## 2013-01-05 MED ORDER — CYCLOPENTOLATE-PHENYLEPHRINE 0.2-1 % OP SOLN
OPHTHALMIC | Status: AC
  Filled 2013-01-05: qty 2

## 2013-01-05 MED ORDER — NEOMYCIN-POLYMYXIN-DEXAMETH 3.5-10000-0.1 OP OINT
TOPICAL_OINTMENT | OPHTHALMIC | Status: AC
  Filled 2013-01-05: qty 3.5

## 2013-01-06 ENCOUNTER — Encounter (HOSPITAL_COMMUNITY): Payer: Self-pay | Admitting: Ophthalmology

## 2013-01-06 ENCOUNTER — Ambulatory Visit (HOSPITAL_COMMUNITY): Payer: Medicare Other | Admitting: Anesthesiology

## 2013-01-06 ENCOUNTER — Encounter (HOSPITAL_COMMUNITY)
Admission: RE | Disposition: A | Discharge status: Home / Self Care | Payer: Self-pay | Source: Ambulatory Visit | Attending: Ophthalmology

## 2013-01-06 ENCOUNTER — Ambulatory Visit (HOSPITAL_COMMUNITY)
Admission: RE | Admit: 2013-01-06 | Discharge: 2013-01-06 | Disposition: A | Discharge status: Home / Self Care | Payer: Medicare Other | Source: Ambulatory Visit | Attending: Ophthalmology | Admitting: Ophthalmology

## 2013-01-06 ENCOUNTER — Encounter (HOSPITAL_COMMUNITY): Payer: Self-pay | Admitting: Anesthesiology

## 2013-01-06 DIAGNOSIS — I10 Essential (primary) hypertension: Secondary | ICD-10-CM | POA: Diagnosis not present

## 2013-01-06 DIAGNOSIS — H2589 Other age-related cataract: Secondary | ICD-10-CM | POA: Insufficient documentation

## 2013-01-06 DIAGNOSIS — H269 Unspecified cataract: Secondary | ICD-10-CM | POA: Diagnosis not present

## 2013-01-06 DIAGNOSIS — J449 Chronic obstructive pulmonary disease, unspecified: Secondary | ICD-10-CM | POA: Insufficient documentation

## 2013-01-06 DIAGNOSIS — Z01810 Encounter for preprocedural cardiovascular examination: Secondary | ICD-10-CM | POA: Insufficient documentation

## 2013-01-06 DIAGNOSIS — H2510 Age-related nuclear cataract, unspecified eye: Secondary | ICD-10-CM | POA: Diagnosis not present

## 2013-01-06 DIAGNOSIS — Z79899 Other long term (current) drug therapy: Secondary | ICD-10-CM | POA: Diagnosis not present

## 2013-01-06 DIAGNOSIS — J4489 Other specified chronic obstructive pulmonary disease: Secondary | ICD-10-CM | POA: Insufficient documentation

## 2013-01-06 DIAGNOSIS — H524 Presbyopia: Secondary | ICD-10-CM | POA: Diagnosis not present

## 2013-01-06 DIAGNOSIS — Z01812 Encounter for preprocedural laboratory examination: Secondary | ICD-10-CM | POA: Diagnosis not present

## 2013-01-06 HISTORY — PX: CATARACT EXTRACTION W/PHACO: SHX586

## 2013-01-06 SURGERY — PHACOEMULSIFICATION, CATARACT, WITH IOL INSERTION
Anesthesia: Monitor Anesthesia Care | Site: Eye | Laterality: Right | Wound class: Clean

## 2013-01-06 MED ORDER — MIDAZOLAM HCL 2 MG/2ML IJ SOLN
1.0000 mg | INTRAMUSCULAR | Status: DC | PRN
  Administered 2013-01-06: 2 mg via INTRAVENOUS

## 2013-01-06 MED ORDER — PHENYLEPHRINE HCL 2.5 % OP SOLN
1.0000 [drp] | OPHTHALMIC | Status: AC
  Administered 2013-01-06 (×3): 1 [drp] via OPHTHALMIC

## 2013-01-06 MED ORDER — LIDOCAINE HCL (PF) 1 % IJ SOLN
INTRAMUSCULAR | Status: DC | PRN
  Administered 2013-01-06: .4 mL

## 2013-01-06 MED ORDER — LIDOCAINE HCL (PF) 1 % IJ SOLN
INTRAMUSCULAR | Status: AC
  Filled 2013-01-06: qty 2

## 2013-01-06 MED ORDER — NEOMYCIN-POLYMYXIN-DEXAMETH 0.1 % OP OINT
TOPICAL_OINTMENT | OPHTHALMIC | Status: DC | PRN
  Administered 2013-01-06: 1 via OPHTHALMIC

## 2013-01-06 MED ORDER — LIDOCAINE HCL 3.5 % OP GEL
1.0000 "application " | Freq: Once | OPHTHALMIC | Status: AC
  Administered 2013-01-06: 1 via OPHTHALMIC

## 2013-01-06 MED ORDER — POVIDONE-IODINE 5 % OP SOLN
OPHTHALMIC | Status: DC | PRN
  Administered 2013-01-06: 1 via OPHTHALMIC

## 2013-01-06 MED ORDER — BSS IO SOLN
INTRAOCULAR | Status: DC | PRN
  Administered 2013-01-06: 15 mL via INTRAOCULAR

## 2013-01-06 MED ORDER — BSS IO SOLN
INTRAOCULAR | Status: DC | PRN
  Administered 2013-01-06: 13:00:00

## 2013-01-06 MED ORDER — LIDOCAINE 3.5 % OP GEL OPTIME - NO CHARGE
OPHTHALMIC | Status: DC | PRN
  Administered 2013-01-06: 1 [drp] via OPHTHALMIC

## 2013-01-06 MED ORDER — TETRACAINE HCL 0.5 % OP SOLN
1.0000 [drp] | OPHTHALMIC | Status: AC
  Administered 2013-01-06 (×3): 1 [drp] via OPHTHALMIC

## 2013-01-06 MED ORDER — CYCLOPENTOLATE-PHENYLEPHRINE 0.2-1 % OP SOLN
1.0000 [drp] | OPHTHALMIC | Status: AC
  Administered 2013-01-06 (×3): 1 [drp] via OPHTHALMIC

## 2013-01-06 MED ORDER — MIDAZOLAM HCL 5 MG/5ML IJ SOLN
INTRAMUSCULAR | Status: AC
  Filled 2013-01-06: qty 5

## 2013-01-06 MED ORDER — PROVISC 10 MG/ML IO SOLN
INTRAOCULAR | Status: DC | PRN
  Administered 2013-01-06: 8.5 mg via INTRAOCULAR

## 2013-01-06 MED ORDER — LACTATED RINGERS IV SOLN
INTRAVENOUS | Status: DC
  Administered 2013-01-06: 1000 mL via INTRAVENOUS

## 2013-01-06 SURGICAL SUPPLY — 32 items
CAPSULAR TENSION RING-AMO (OPHTHALMIC RELATED) IMPLANT
CLOTH BEACON ORANGE TIMEOUT ST (SAFETY) ×1 IMPLANT
EYE SHIELD UNIVERSAL CLEAR (GAUZE/BANDAGES/DRESSINGS) ×1 IMPLANT
GLOVE BIO SURGEON STRL SZ 6.5 (GLOVE) IMPLANT
GLOVE BIOGEL PI IND STRL 6.5 (GLOVE) IMPLANT
GLOVE BIOGEL PI IND STRL 7.0 (GLOVE) IMPLANT
GLOVE BIOGEL PI IND STRL 7.5 (GLOVE) IMPLANT
GLOVE BIOGEL PI INDICATOR 6.5 (GLOVE) ×1
GLOVE BIOGEL PI INDICATOR 7.0 (GLOVE)
GLOVE BIOGEL PI INDICATOR 7.5 (GLOVE)
GLOVE ECLIPSE 6.5 STRL STRAW (GLOVE) IMPLANT
GLOVE ECLIPSE 7.0 STRL STRAW (GLOVE) IMPLANT
GLOVE ECLIPSE 7.5 STRL STRAW (GLOVE) IMPLANT
GLOVE EXAM NITRILE LRG STRL (GLOVE) IMPLANT
GLOVE EXAM NITRILE MD LF STRL (GLOVE) ×1 IMPLANT
GLOVE SKINSENSE NS SZ6.5 (GLOVE)
GLOVE SKINSENSE NS SZ7.0 (GLOVE)
GLOVE SKINSENSE STRL SZ6.5 (GLOVE) IMPLANT
GLOVE SKINSENSE STRL SZ7.0 (GLOVE) IMPLANT
KIT VITRECTOMY (OPHTHALMIC RELATED) IMPLANT
LENS INTRAOCULAR RESTOR ×1 IMPLANT
PAD ARMBOARD 7.5X6 YLW CONV (MISCELLANEOUS) ×1 IMPLANT
PROC W NO LENS (INTRAOCULAR LENS)
PROC W SPEC LENS (INTRAOCULAR LENS) ×2
PROCESS W NO LENS (INTRAOCULAR LENS) IMPLANT
PROCESS W SPEC LENS (INTRAOCULAR LENS) IMPLANT
RING MALYGIN (MISCELLANEOUS) IMPLANT
SYR TB 1ML LL NO SAFETY (SYRINGE) ×1 IMPLANT
TAPE SURG TRANSPORE 1 IN (GAUZE/BANDAGES/DRESSINGS) IMPLANT
TAPE SURGICAL TRANSPORE 1 IN (GAUZE/BANDAGES/DRESSINGS) ×1
VISCOELASTIC ADDITIONAL (OPHTHALMIC RELATED) IMPLANT
WATER STERILE IRR 250ML POUR (IV SOLUTION) ×1 IMPLANT

## 2013-01-06 NOTE — Transfer of Care (Signed)
Immediate Anesthesia Transfer of Care Note  Patient: Michael Evans  Procedure(s) Performed: Procedure(s) (LRB) with comments: CATARACT EXTRACTION PHACO AND INTRAOCULAR LENS PLACEMENT (IOC) (Right) - CDE: 22.17  Patient Location: Short Stay  Anesthesia Type:MAC  Level of Consciousness: awake, alert , oriented and patient cooperative  Airway & Oxygen Therapy: Patient Spontanous Breathing  Post-op Assessment: Report given to PACU RN, Post -op Vital signs reviewed and stable and Patient moving all extremities  Post vital signs: Reviewed and stable  Complications: No apparent anesthesia complications

## 2013-01-06 NOTE — Anesthesia Preprocedure Evaluation (Signed)
Anesthesia Evaluation  Patient identified by MRN, date of birth, ID band Patient awake    Reviewed: Allergy & Precautions, H&P , NPO status , Patient's Chart, lab work & pertinent test results  Airway Mallampati: II TM Distance: >3 FB     Dental  (+) Edentulous Upper and Edentulous Lower   Pulmonary sleep apnea , COPD breath sounds clear to auscultation        Cardiovascular hypertension, Pt. on medications Rhythm:Regular Rate:Normal     Neuro/Psych    GI/Hepatic   Endo/Other    Renal/GU      Musculoskeletal   Abdominal   Peds  Hematology   Anesthesia Other Findings   Reproductive/Obstetrics                           Anesthesia Physical Anesthesia Plan  ASA: III  Anesthesia Plan: MAC   Post-op Pain Management:    Induction: Intravenous  Airway Management Planned: Nasal Cannula  Additional Equipment:   Intra-op Plan:   Post-operative Plan:   Informed Consent: I have reviewed the patients History and Physical, chart, labs and discussed the procedure including the risks, benefits and alternatives for the proposed anesthesia with the patient or authorized representative who has indicated his/her understanding and acceptance.     Plan Discussed with:   Anesthesia Plan Comments:         Anesthesia Quick Evaluation

## 2013-01-06 NOTE — Anesthesia Postprocedure Evaluation (Signed)
  Anesthesia Post-op Note  Patient: Michael Evans  Procedure(s) Performed: Procedure(s) (LRB) with comments: CATARACT EXTRACTION PHACO AND INTRAOCULAR LENS PLACEMENT (IOC) (Right) - CDE: 22.17  Patient Location: Short Stay  Anesthesia Type:MAC  Level of Consciousness: awake, alert , oriented and patient cooperative  Airway and Oxygen Therapy: Patient Spontanous Breathing  Post-op Pain: none  Post-op Assessment: Post-op Vital signs reviewed, Patient's Cardiovascular Status Stable, Respiratory Function Stable, Patent Airway, No signs of Nausea or vomiting and Adequate PO intake  Post-op Vital Signs: Reviewed and stable  Complications: No apparent anesthesia complications

## 2013-01-06 NOTE — Brief Op Note (Signed)
Pre-Op Dx: Cataract OD Post-Op Dx: Cataract OD Surgeon: Gemma Payor Anesthesia: Topical with MAC Surgery: Cataract Extraction with Intraocular lens Implant OD Implant:  Alcon ReStor, [id number] Blood Loss: None Specimen: None Complications: None

## 2013-01-06 NOTE — H&P (Signed)
I have reviewed the H&P, the patient was re-examined, and I have identified no interval changes in medical condition and plan of care since the history and physical of record  

## 2013-01-06 NOTE — Discharge Instructions (Signed)
Michael Evans  01/06/2013     Instructions  1. Use medications as Instructed.  Shake well before use. Wait 5 minutes between drops.  {OPHTHALMIC ANTIBIOTICS:22167} 4 times a day x 1 week.  {OPHTHALMIC ANTI-INFLAMMATORY:22168} 2 times a day x 4 weeks.  {OPHTHALMIC STEROID:22169} 4 times a day - week 1   3 times a day - Week 2, 2 times a day- Week 3, 1 time a day - Week 4.  2. Do not rub the operative eye. Do not swim underwater for 2 weeks.  3. You may remove the clear shield and resume your normal activities the day after  Surgery. Your eyes may feel more comfortable if you wear dark glasses outside.  4. Call our office at [phone] if you have sudden change in vision, extreme redness or pain. Some fluctuation in vision is normal after surgery. If you have an emergency after hours, call Dr. Alto Denver at [phone].  5. It is important that you attend all of your follow-up appointments.        Follow-up:{follow up:32580} with Gemma Payor, MD.   Dr. Lahoma Crocker: [phone]  Dr. Lita Mains: [phone]  Dr. Alto Denver: [phone]   If you find that you cannot contact your physician, but feel that your signs and   Symptoms warrant a physician's attention, call the Emergency Room at   [phone] ext.[phone].   Other{NA AND WUXLKGMW:10272}.

## 2013-01-07 ENCOUNTER — Encounter (HOSPITAL_COMMUNITY): Payer: Self-pay | Admitting: Ophthalmology

## 2013-01-07 NOTE — Op Note (Signed)
NAMERENARD, CAPERTON                 ACCOUNT NO.:  [id number]  MEDICAL RECORD NO.:  [id number]  LOCATION:  [facility]                          FACILITY:  [facility]  PHYSICIAN:  Susanne Greenhouse, MD       DATE OF BIRTH:  [birth year]  DATE OF PROCEDURE:  01/06/2013 DATE OF DISCHARGE:  01/06/2013                              OPERATIVE REPORT   PREOPERATIVE DIAGNOSIS:  Combined cataract, right eye, diagnosis code 366.19.  POSTOPERATIVE DIAGNOSIS:  Combined cataract, right eye, diagnosis code 366.19.  OPERATION PERFORMED:  Phacoemulsification with posterior chamber intraocular lens implantation, right eye.  SURGEON:  Bonne Dolores. Dazha Kempa, MD  ANESTHESIA:  Topical with monitored anesthesia care with IV sedation.  OPERATIVE SUMMARY:  In the preoperative area, dilating drops were placed into the right eye.  The patient was then brought into the operating room where he was placed under general anesthesia.  The eye was then prepped and draped.  Beginning with a 75 blade, a paracentesis port was made at the surgeon's 2 o'clock position.  The anterior chamber was then filled with a 1% nonpreserved lidocaine solution with epinephrine.  This was followed by Viscoat to deepen the chamber.  A small fornix-based peritomy was performed superiorly.  Next, a single iris hook was placed through the limbus superiorly.  A 2.4-mm keratome blade was then used to make a clear corneal incision over the iris hook.  A bent cystotome needle and Utrata forceps were used to create a continuous tear capsulotomy.  Hydrodissection was performed using balanced salt solution on a fine cannula.  The lens nucleus was then removed using phacoemulsification in a quadrant cracking technique.  The cortical material was then removed with irrigation and aspiration.  The capsular bag and anterior chamber were refilled with Provisc.  The wound was widened to approximately 3 mm and a posterior chamber intraocular lens was placed into the capsular  bag without difficulty using an [employer] lens injecting system.  A single 10-0 nylon suture was then used to close the incision as well as stromal hydration.  The Provisc was removed from the anterior chamber and capsular bag with irrigation and aspiration.  At this point, the wounds were tested for leak, which were negative.  The anterior chamber remained deep and stable.  The patient tolerated the procedure well.  There were no operative complications.  No surgical specimens.  Prosthetic device used Alcon Acrysof ReSTOR posterior chamber lens, model [id number], power of 21.0, serial number is [id number].          ______________________________ Susanne Greenhouse, MD     KEH/MEDQ  D:  01/06/2013  T:  01/07/2013  Job:  [id number]

## 2013-01-18 DIAGNOSIS — J441 Chronic obstructive pulmonary disease with (acute) exacerbation: Secondary | ICD-10-CM | POA: Diagnosis not present

## 2013-01-25 DIAGNOSIS — I10 Essential (primary) hypertension: Secondary | ICD-10-CM | POA: Diagnosis not present

## 2013-01-25 DIAGNOSIS — J449 Chronic obstructive pulmonary disease, unspecified: Secondary | ICD-10-CM | POA: Diagnosis not present

## 2013-03-22 ENCOUNTER — Other Ambulatory Visit: Payer: Self-pay | Admitting: Family Medicine

## 2013-03-22 NOTE — Telephone Encounter (Signed)
Med rf per protocol °

## 2013-04-23 ENCOUNTER — Other Ambulatory Visit: Payer: Self-pay | Admitting: Family Medicine

## 2013-05-20 ENCOUNTER — Telehealth: Payer: Self-pay | Admitting: Family Medicine

## 2013-05-20 MED ORDER — IPRATROPIUM-ALBUTEROL 20-100 MCG/ACT IN AERS: 2.0000 | INHALATION_SPRAY | Freq: Four times a day (QID) | RESPIRATORY_TRACT | Status: DC

## 2013-05-20 NOTE — Telephone Encounter (Signed)
Rx Refilled  

## 2013-05-27 ENCOUNTER — Ambulatory Visit (INDEPENDENT_AMBULATORY_CARE_PROVIDER_SITE_OTHER): Payer: Medicare Other | Admitting: Family Medicine

## 2013-05-27 ENCOUNTER — Encounter: Payer: Self-pay | Admitting: Family Medicine

## 2013-05-27 VITALS — BP 120/62 | HR 78 | Temp 97.8°F | Resp 16 | Ht 65.0 in | Wt 164.0 lb

## 2013-05-27 DIAGNOSIS — J441 Chronic obstructive pulmonary disease with (acute) exacerbation: Secondary | ICD-10-CM | POA: Diagnosis not present

## 2013-05-27 MED ORDER — BUDESONIDE-FORMOTEROL FUMARATE 160-4.5 MCG/ACT IN AERO: 2.0000 | INHALATION_SPRAY | Freq: Two times a day (BID) | RESPIRATORY_TRACT | Status: DC

## 2013-05-27 MED ORDER — PREDNISONE 20 MG PO TABS: 20.0000 mg | ORAL_TABLET | Freq: Every day | ORAL | Status: DC

## 2013-05-27 MED ORDER — BENZONATATE 100 MG PO CAPS: 100.0000 mg | ORAL_CAPSULE | Freq: Two times a day (BID) | ORAL | Status: DC | PRN

## 2013-05-27 MED ORDER — DOXYCYCLINE HYCLATE 100 MG PO TABS: 100.0000 mg | ORAL_TABLET | Freq: Two times a day (BID) | ORAL | Status: DC

## 2013-05-27 NOTE — Patient Instructions (Addendum)
Continue inhalers Start the prednisone today Start the antibiotics Cough pill sent Call if you do not improve

## 2013-05-29 ENCOUNTER — Encounter: Payer: Self-pay | Admitting: Family Medicine

## 2013-05-29 DIAGNOSIS — J441 Chronic obstructive pulmonary disease with (acute) exacerbation: Secondary | ICD-10-CM | POA: Insufficient documentation

## 2013-05-29 NOTE — Assessment & Plan Note (Addendum)
Continue combivent, and spiriva I think he is early in his exacerbation, no hypoxia, stable for outpatient treatment Add oral steroids, cover with doxycycline, elderly male higher risk for quickly deteriorating with history Cough med given

## 2013-05-29 NOTE — Progress Notes (Signed)
  Subjective:    Patient ID: Michael Evans, male    DOB: [birth year], 72 y.o.   MRN: [id number]  HPI  Pt here with Cough, with production, SOB, wheeze for the past few days. Has known COPD, feels like his similar exacerbations. No longer follows with pulmonary, quit smoking. Lives along, no oxygen therapy. Has not tried any OTC medications  Review of Systems - per above  GEN- denies fatigue, fever, weight loss,weakness, recent illness HEENT- denies eye drainage, change in vision, nasal discharge, CVS- denies chest pain, palpitations RESP-+ SOB, +cough, +wheeze ABD- denies N/V, change in stools, abd pain Neuro- denies headache, dizziness, syncope, seizure activity       Objective:   Physical Exam  GEN- NAD, alert and oriented x3 HEENT- PERRL, EOMI, non injected sclera, pink conjunctiva, MMM, oropharynx clear, no sinus tenderness Neck- Supple, no LAD CVS- RRR, no murmur RESP-scattered wheeze, decreased BS at bases, good WOB, good Air movement , harsh cough EXT- No edema Pulses- Radial 2+       Assessment & Plan:

## 2013-06-14 ENCOUNTER — Ambulatory Visit (INDEPENDENT_AMBULATORY_CARE_PROVIDER_SITE_OTHER)
Admission: RE | Admit: 2013-06-14 | Discharge: 2013-06-14 | Disposition: A | Discharge status: Home / Self Care | Payer: Medicare Other | Source: Ambulatory Visit | Attending: Pulmonary Disease | Admitting: Pulmonary Disease

## 2013-06-14 ENCOUNTER — Ambulatory Visit (INDEPENDENT_AMBULATORY_CARE_PROVIDER_SITE_OTHER): Payer: Medicare Other | Admitting: Pulmonary Disease

## 2013-06-14 ENCOUNTER — Encounter: Payer: Self-pay | Admitting: Pulmonary Disease

## 2013-06-14 VITALS — BP 138/82 | HR 70 | Temp 98.4°F | Ht 64.0 in | Wt 161.8 lb

## 2013-06-14 DIAGNOSIS — G4733 Obstructive sleep apnea (adult) (pediatric): Secondary | ICD-10-CM | POA: Diagnosis not present

## 2013-06-14 DIAGNOSIS — J449 Chronic obstructive pulmonary disease, unspecified: Secondary | ICD-10-CM | POA: Diagnosis not present

## 2013-06-14 MED ORDER — IPRATROPIUM-ALBUTEROL 20-100 MCG/ACT IN AERS: 1.0000 | INHALATION_SPRAY | Freq: Four times a day (QID) | RESPIRATORY_TRACT | Status: DC | PRN

## 2013-06-14 MED ORDER — BUDESONIDE-FORMOTEROL FUMARATE 160-4.5 MCG/ACT IN AERO: 2.0000 | INHALATION_SPRAY | Freq: Two times a day (BID) | RESPIRATORY_TRACT | Status: DC

## 2013-06-14 NOTE — Assessment & Plan Note (Signed)
He has progressive symptoms.  This is likely related to under-use of symbicort.  Advised him to use symbicort two puffs bid, and use combivent prn.

## 2013-06-14 NOTE — Patient Instructions (Signed)
Use symbicort two puffs twice per day, and rinse mouth after each use Combivent two puffs up to four times per day as needed Call if not feeling better Follow up in 6 months

## 2013-06-14 NOTE — Progress Notes (Signed)
Chief Complaint  Patient presents with  . Acute Visit    pt reports breathing has worsened in the last 1 month worse in the evenings, relates this to humidity-- also c/o wheezing, prod cough w thick "cotton colored" mucus, chest tx denies f/c/s off pred x 1 month    History of Present Illness: Michael Evans is a 72 y.o. male former smoker with COPD, and OSA intolerant of CPAP.  I last saw Michael Evans in 2012.  He has noticed more trouble with his breathing for the past month.  He has more cough with clear sputum.  He is getting occasional wheezing.  He denies chest pain, leg swelling or hemoptysis.  He was treated with antibiotics about a month ago.  He has not been on prednisone recently.  He is only using symbicort once per day, and using combivent 4 times per day.  He has been feeling warm, but has not checked his temperature.   TESTS: PSG 05/20/11 >> AHI 8, SpO2 low 87%, PLMI 22 PFT 06/26/09 >> FEV1 1.75(71%), FEV1% 48, TLC 5.53(104%), DLCO 55%, no BD Spirometry 06/14/13 >> FEV1 1.79 (66%), FEV1% 55  Michael Evans  has a past medical history of COPD (chronic obstructive pulmonary disease); Arthritis; Skin cancer; Hypertension; Nasal septal deviation; Chronic rhinitis; OSA (obstructive sleep apnea) (04/21/2011); and Nocturia.  Michael Evans  has past surgical history that includes Back surgery; Appendectomy; and Cataract extraction w/PHACO (01/06/2013).  Prior to Admission medications   Medication Sig Start Date End Date Taking? Authorizing Provider  budesonide-formoterol (SYMBICORT) 160-4.5 MCG/ACT inhaler Inhale 2 puffs into the lungs 2 (two) times daily. 05/27/13  Yes Salley Scarlet, MD  Ipratropium-Albuterol (COMBIVENT RESPIMAT) 20-100 MCG/ACT AERS respimat Inhale 2 puffs into the lungs 4 (four) times daily. 05/20/13  Yes Donita Brooks, MD    Allergies  Allergen Reactions  . Aspirin Other (See Comments)    Upset stomach.  . Penicillins Hives  . Sulfa Antibiotics Hives      Physical Exam:  General - No distress ENT - No sinus tenderness, no oral exudate, no LAN Cardiac - s1s2 regular, no murmur Chest - No wheeze/rales/dullness Back - No focal tenderness Abd - Soft, non-tender Ext - No edema Neuro - Normal strength Skin - No rashes Psych - normal mood, and behavior  Dg Chest 2 View  06/14/2013   *RADIOLOGY REPORT*  Clinical Data: History of COPD.  Increased cough  CHEST - 2 VIEW  Comparison: 02/26/2011 and 07/05/2009  Findings: Chronic changes of COPD/emphysema with relative lucency of the upper lung fields compared to the lung bases, and flattening of the hemidiaphragms.  Streaky linear opacities at the lung bases appear similar to prior exams and may reflect scarring and/or atelectasis.  A right nipple shadow is noted and unchanged compared to chest radiograph of 02/26/2011.  No acute bony abnormality is identified.  IMPRESSION: Chronic COPD. Linear atelectasis and/or scarring at the lung bases.   Original Report Authenticated By: Britta Mccreedy, M.D.      Assessment/Plan:  Coralyn Helling, MD [place] [facility] Pager:  [phone]

## 2013-07-22 ENCOUNTER — Ambulatory Visit (INDEPENDENT_AMBULATORY_CARE_PROVIDER_SITE_OTHER): Payer: Medicare Other | Admitting: Family Medicine

## 2013-07-22 ENCOUNTER — Encounter: Payer: Self-pay | Admitting: Family Medicine

## 2013-07-22 VITALS — BP 90/58 | HR 98 | Temp 98.2°F | Resp 18 | Wt 163.0 lb

## 2013-07-22 DIAGNOSIS — J441 Chronic obstructive pulmonary disease with (acute) exacerbation: Secondary | ICD-10-CM | POA: Diagnosis not present

## 2013-07-22 MED ORDER — AZITHROMYCIN 250 MG PO TABS: ORAL_TABLET | ORAL | Status: DC

## 2013-07-22 MED ORDER — PREDNISONE 20 MG PO TABS: ORAL_TABLET | ORAL | Status: DC

## 2013-07-22 MED ORDER — IPRATROPIUM-ALBUTEROL 20-100 MCG/ACT IN AERS: 1.0000 | INHALATION_SPRAY | Freq: Four times a day (QID) | RESPIRATORY_TRACT | Status: DC | PRN

## 2013-07-22 NOTE — Progress Notes (Signed)
Subjective:    Patient ID: Michael Evans, male    DOB: 1941-08-10, 72 y.o.   MRN: [id number]  HPI Patient reports one week of worsening cough, increasing shortness of breath, cough productive of green and yellow sputum, and increased wheezing. He also reports subjective fevers. His blood pressure is also low. He denies any dizziness or presyncope. He denies any chest pain or pleurisy. Past Medical History  Diagnosis Date  . COPD (chronic obstructive pulmonary disease)     PFT 06-26-09 FEV1  1.75( 71%), FVC 3.65( 101%), FEV1% 48, TLC 5.53(104%), DLCO 55%, no BD  . Arthritis   . Skin cancer   . Hypertension   . Nasal septal deviation   . Chronic rhinitis   . OSA (obstructive sleep apnea) 04/21/2011  . Nocturia    Past Surgical History  Procedure Laterality Date  . Back surgery    . Appendectomy    . Cataract extraction w/phaco  01/06/2013    Procedure: CATARACT EXTRACTION PHACO AND INTRAOCULAR LENS PLACEMENT (IOC);  Surgeon: Gemma Payor, MD;  Location: [facility];  Service: Ophthalmology;  Laterality: Right;  CDE: 22.17   Current Outpatient Prescriptions on File Prior to Visit  Medication Sig Dispense Refill  . budesonide-formoterol (SYMBICORT) 160-4.5 MCG/ACT inhaler Inhale 2 puffs into the lungs 2 (two) times daily.  1 Inhaler  6   No current facility-administered medications on file prior to visit.   Allergies  Allergen Reactions  . Aspirin Other (See Comments)    Upset stomach.  . Penicillins Hives  . Sulfa Antibiotics Hives   History   Social History  . Marital Status: Divorced    Spouse Name: N/A    Number of Children: N/A  . 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 Smoking status: Former Smoker -- 1.50 packs/day for 54 years    Types: Cigarettes    Quit date: 12/01/2001  . Smokeless tobacco: Never Used  . Alcohol Use: No  . Drug Use: No  . Sexual Activity: Not on file   Other Topics Concern  . Not on file    Social History Narrative  . No narrative on file      Review of Systems  All other systems reviewed and are negative.       Objective:   Physical Exam  Vitals reviewed. Constitutional: He appears well-developed and well-nourished. No distress.  HENT:  Head: Normocephalic.  Right Ear: External ear normal.  Nose: Nose normal.  Mouth/Throat: Oropharynx is clear and moist. No oropharyngeal exudate.  Eyes: Conjunctivae are normal. Pupils are equal, round, and reactive to light.  Neck: Neck supple. No JVD present.  Cardiovascular: Normal rate, regular rhythm and normal heart sounds.   No murmur heard. Pulmonary/Chest: Effort normal. No respiratory distress. He has wheezes. He has no rales.  Lymphadenopathy:    He has no cervical adenopathy.  Skin: He is not diaphoretic.   patient is wheezing in all 4 lung quadrants. He has diminished breath sounds throughout. He has a prolonged expiratory phase.        Assessment & Plan:  1. COPD exacerbation I will treat the patient with a COPD exacerbation. Again a Z-Pak as well as prednisone 40 mg by mouth daily for 7 days. I also recommended he use Combivent 1 puff inhaled every 6 hours as necessary. Also recommended he discontinue amlodipine temporarily until he is feeling better and try to increase his fluid  intake. - azithromycin (ZITHROMAX) 250 MG tablet; 2 tabs poqday 1, 1 tab poqday 2-5  Dispense: 6 tablet; Refill: 0 - predniSONE (DELTASONE) 20 MG tablet; 2 tabs poqday for 7 days  Dispense: 14 tablet; Refill: 0 - Ipratropium-Albuterol (COMBIVENT RESPIMAT) 20-100 MCG/ACT AERS respimat; Inhale 1 puff into the lungs every 6 (six) hours as needed for wheezing.  Dispense: 4 g; Refill: 3

## 2013-09-12 ENCOUNTER — Ambulatory Visit: Payer: Medicare Other

## 2013-09-13 ENCOUNTER — Ambulatory Visit: Payer: Medicare Other

## 2013-09-13 ENCOUNTER — Ambulatory Visit (INDEPENDENT_AMBULATORY_CARE_PROVIDER_SITE_OTHER): Payer: Medicare Other | Admitting: Family Medicine

## 2013-09-13 DIAGNOSIS — Z23 Encounter for immunization: Secondary | ICD-10-CM

## 2013-12-07 DIAGNOSIS — H2510 Age-related nuclear cataract, unspecified eye: Secondary | ICD-10-CM | POA: Diagnosis not present

## 2013-12-07 DIAGNOSIS — Z961 Presence of intraocular lens: Secondary | ICD-10-CM | POA: Diagnosis not present

## 2014-03-06 ENCOUNTER — Ambulatory Visit (INDEPENDENT_AMBULATORY_CARE_PROVIDER_SITE_OTHER): Payer: Medicare Other | Admitting: Physician Assistant

## 2014-03-06 ENCOUNTER — Encounter: Payer: Self-pay | Admitting: Physician Assistant

## 2014-03-06 VITALS — BP 136/80 | HR 72 | Temp 98.0°F | Resp 18 | Ht 65.0 in | Wt 159.0 lb

## 2014-03-06 DIAGNOSIS — J22 Unspecified acute lower respiratory infection: Secondary | ICD-10-CM

## 2014-03-06 DIAGNOSIS — J988 Other specified respiratory disorders: Secondary | ICD-10-CM

## 2014-03-06 DIAGNOSIS — J441 Chronic obstructive pulmonary disease with (acute) exacerbation: Secondary | ICD-10-CM | POA: Diagnosis not present

## 2014-03-06 DIAGNOSIS — J439 Emphysema, unspecified: Secondary | ICD-10-CM

## 2014-03-06 DIAGNOSIS — J438 Other emphysema: Secondary | ICD-10-CM

## 2014-03-06 MED ORDER — PREDNISONE 20 MG PO TABS: ORAL_TABLET | ORAL | Status: DC

## 2014-03-06 MED ORDER — AZITHROMYCIN 250 MG PO TABS: ORAL_TABLET | ORAL | Status: DC

## 2014-03-06 NOTE — Progress Notes (Signed)
    Patient ID: Michael Evans MRN: [id number], DOB: 1941/08/09, 73 y.o. Date of Encounter: 03/06/2014, 11:15 AM    Chief Complaint:  Chief Complaint  Patient presents with  . cough can't get rid of    effected by allergies     HPI: 73 y.o. year old male says that he has had this cough "since January" !!  Says he has not seen any other medical providers about it. Says that the cough is productive and he does get up some phlegm. Says that his cough and his breathing have been worse over the past 3 weeks. Says that at times he does have wheezing and some shortness of breath. Says he is using his Symbicort and pulmonary medicines as directed. States that he is having no nasal congestion no nasal mucus. No head congestion either. No Sore throat or earache, no fevers or chills. I note that--in the computer-- he does see pulmonary at his last visit there was dated July 2014.     Home Meds: See attached medication section for any medications that were entered at today's visit. The computer does not put those onto this list.The following list is a list of meds entered prior to today's visit.   Current Outpatient Prescriptions on File Prior to Visit  Medication Sig Dispense Refill  . budesonide-formoterol (SYMBICORT) 160-4.5 MCG/ACT inhaler Inhale 2 puffs into the lungs 2 (two) times daily.  1 Inhaler  6  . Ipratropium-Albuterol (COMBIVENT RESPIMAT) 20-100 MCG/ACT AERS respimat Inhale 1 puff into the lungs every 6 (six) hours as needed for wheezing.  4 g  3  . amLODipine (NORVASC) 10 MG tablet Take 10 mg by mouth daily.       No current facility-administered medications on file prior to visit.    Allergies:  Allergies  Allergen Reactions  . Aspirin Other (See Comments)    Upset stomach.  . Penicillins Hives  . Sulfa Antibiotics Hives      Review of Systems: See HPI for pertinent ROS. All other ROS negative.    Physical Exam: Blood pressure 136/80, pulse 72, temperature 98 F (36.7  C), temperature source Oral, resp. rate 18, height 5\' 5"  (1.651 m), weight 159 lb (72.122 kg), SpO2 95.00%., Body mass index is 26.46 kg/(m^2). General:  WNWD WM. Appears in no acute distress. Lungs: He has distant/decreased breath sounds throughout. But. It does sound clear- I hear no wheezes. No rhonchi, rales either.  Heart: Regular rhythm. No murmurs, rubs, or gallops. Msk:  Strength and tone normal for age. Extremities/Skin: Warm and dry.  No edema.  Neuro: Alert and oriented X 3. Moves all extremities spontaneously. Gait is normal. CNII-XII grossly in tact. Psych:  Responds to questions appropriately with a normal affect.     ASSESSMENT AND PLAN:  73 y.o. year old male with  1. Lower respiratory tract infection - azithromycin (ZITHROMAX) 250 MG tablet; Day 1: Take 2 daily.  Days 2-5: Take 1 daily.  Dispense: 6 tablet; Refill: 0  2. COPD exacerbation - predniSONE (DELTASONE) 20 MG tablet; Take 2 daily for 5 days  Dispense: 10 tablet; Refill: 0  3. COPD with emphysema Cont Symbicort, current routine COPD treatment.  Follow up if his breathing worsens or does not return back to baseline after completion of medications.   Signed, [address] [place], [place], [facility] 03/06/2014 11:15 AM

## 2014-03-31 ENCOUNTER — Ambulatory Visit (INDEPENDENT_AMBULATORY_CARE_PROVIDER_SITE_OTHER): Payer: Medicare Other | Admitting: Family Medicine

## 2014-03-31 ENCOUNTER — Encounter: Payer: Self-pay | Admitting: Family Medicine

## 2014-03-31 VITALS — BP 142/96 | HR 74 | Temp 97.0°F | Resp 24 | Ht 65.0 in | Wt 159.0 lb

## 2014-03-31 DIAGNOSIS — R197 Diarrhea, unspecified: Secondary | ICD-10-CM

## 2014-03-31 LAB — COMPLETE METABOLIC PANEL WITH GFR
ALK PHOS: 60 U/L (ref 39–117)
ALT: 18 U/L (ref 0–53)
AST: 20 U/L (ref 0–37)
Albumin: 4.1 g/dL (ref 3.5–5.2)
BUN: 15 mg/dL (ref 6–23)
CO2: 26 mEq/L (ref 19–32)
Calcium: 9.6 mg/dL (ref 8.4–10.5)
Chloride: 104 mEq/L (ref 96–112)
Creat: 1 mg/dL (ref 0.50–1.35)
GFR, EST NON AFRICAN AMERICAN: 73 mL/min
GFR, Est African American: 85 mL/min
GLUCOSE: 100 mg/dL — AB (ref 70–99)
Potassium: 4.7 mEq/L (ref 3.5–5.3)
Sodium: 141 mEq/L (ref 135–145)
TOTAL PROTEIN: 6.7 g/dL (ref 6.0–8.3)
Total Bilirubin: 0.6 mg/dL (ref 0.2–1.2)

## 2014-03-31 LAB — CBC WITH DIFFERENTIAL/PLATELET
Basophils Absolute: 0 10*3/uL (ref 0.0–0.1)
Basophils Relative: 0 % (ref 0–1)
Eosinophils Absolute: 0.2 10*3/uL (ref 0.0–0.7)
Eosinophils Relative: 3 % (ref 0–5)
HEMATOCRIT: 45.8 % (ref 39.0–52.0)
HEMOGLOBIN: 15.8 g/dL (ref 13.0–17.0)
LYMPHS PCT: 40 % (ref 12–46)
Lymphs Abs: 3.1 10*3/uL (ref 0.7–4.0)
MCH: 29.8 pg (ref 26.0–34.0)
MCHC: 34.5 g/dL (ref 30.0–36.0)
MCV: 86.4 fL (ref 78.0–100.0)
MONO ABS: 0.7 10*3/uL (ref 0.1–1.0)
MONOS PCT: 9 % (ref 3–12)
NEUTROS ABS: 3.7 10*3/uL (ref 1.7–7.7)
Neutrophils Relative %: 48 % (ref 43–77)
Platelets: 323 10*3/uL (ref 150–400)
RBC: 5.3 MIL/uL (ref 4.22–5.81)
RDW: 12.9 % (ref 11.5–15.5)
WBC: 7.7 10*3/uL (ref 4.0–10.5)

## 2014-03-31 NOTE — Progress Notes (Signed)
   Subjective:    Patient ID: Michael Evans, male    DOB: Jul 02, 1941, 73 y.o.   MRN: [id number]  HPI Patient had copious diarrhea for approximately one week. He's also had abdominal cramps. He recently took his chickens to a Animal nutritionist but stool analysis to the chickens or contaminated with Giardia, tapeworm.  The patient reports that he is seeing what he calls tapeworms in his stool. He denies any bloody diarrhea. He denies any fevers. Past Medical History  Diagnosis Date  . COPD (chronic obstructive pulmonary disease)     PFT 06-26-09 FEV1  1.75( 71%), FVC 3.65( 101%), FEV1% 48, TLC 5.53(104%), DLCO 55%, no BD  . Arthritis   . Skin cancer   . Hypertension   . Nasal septal deviation   . Chronic rhinitis   . OSA (obstructive sleep apnea) 04/21/2011  . Nocturia    Current Outpatient Prescriptions on File Prior to Visit  Medication Sig Dispense Refill  . budesonide-formoterol (SYMBICORT) 160-4.5 MCG/ACT inhaler Inhale 2 puffs into the lungs 2 (two) times daily.  1 Inhaler  6  . Cyanocobalamin (B-12 PO) Take 1 capsule by mouth daily.      . Ipratropium-Albuterol (COMBIVENT RESPIMAT) 20-100 MCG/ACT AERS respimat Inhale 1 puff into the lungs every 6 (six) hours as needed for wheezing.  4 g  3  . Multiple Vitamin (MULTIVITAMIN) tablet Take 1 tablet by mouth daily.       No current facility-administered medications on file prior to visit.   Allergies  Allergen Reactions  . Aspirin Other (See Comments)    Upset stomach.  . Penicillins Hives  . Sulfa Antibiotics Hives   History   Social History  . Marital Status: Divorced    Spouse Name: N/A    Number of Children: N/A  . 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 Smoking status: Former Smoker -- 1.50 packs/day for 54 years    Types: Cigarettes    Quit date: 12/01/2001  . Smokeless tobacco: Never Used  . Alcohol Use: No  . Drug Use: No  . Sexual Activity: Not on file    Other Topics Concern  . Not on file   Social History Narrative  . No narrative on file      Review of Systems  All other systems reviewed and are negative.      Objective:   Physical Exam  Vitals reviewed. Cardiovascular: Normal rate, regular rhythm and normal heart sounds.   Pulmonary/Chest: Effort normal and breath sounds normal. No respiratory distress. He has no wheezes.  Abdominal: Soft. Bowel sounds are normal. He exhibits no distension. There is no tenderness. There is no rebound and no guarding.          Assessment & Plan:  1. Diarrhea Course in the patient's stool for stool culture as well as over the and parasite also is. Also check a CBC and CMP. If the patient has Giardia treated with Flagyl. His stool analysis shows evidence of a tapeworm I'll treat him with the appropriate antihelminthic therapy - COMPLETE METABOLIC PANEL WITH GFR - CBC with Differential - Ova and parasite examination - Stool culture; Future

## 2014-04-03 ENCOUNTER — Other Ambulatory Visit: Payer: Self-pay | Admitting: Family Medicine

## 2014-04-03 ENCOUNTER — Telehealth: Payer: Self-pay | Admitting: *Deleted

## 2014-04-03 ENCOUNTER — Ambulatory Visit: Payer: Medicare Other | Admitting: *Deleted

## 2014-04-03 ENCOUNTER — Other Ambulatory Visit: Payer: Medicare Other

## 2014-04-03 VITALS — BP 130/90

## 2014-04-03 DIAGNOSIS — I10 Essential (primary) hypertension: Secondary | ICD-10-CM

## 2014-04-03 DIAGNOSIS — R197 Diarrhea, unspecified: Secondary | ICD-10-CM

## 2014-04-03 NOTE — Telephone Encounter (Signed)
bp much better, no change in meds, awaiting the results of stool cx's.

## 2014-04-03 NOTE — Telephone Encounter (Signed)
Pt came in today for BP check and it was 130/90. Pt wants to know what he is to do now. He stated that you wanted him to come this week and have it checked.

## 2014-04-05 LAB — OVA AND PARASITE EXAMINATION
OP: NONE SEEN
OP: NONE SEEN

## 2014-04-06 ENCOUNTER — Other Ambulatory Visit: Payer: Self-pay | Admitting: *Deleted

## 2014-04-06 MED ORDER — METRONIDAZOLE ER 750 MG PO TB24: 750.0000 mg | ORAL_TABLET | Freq: Three times a day (TID) | ORAL | Status: DC

## 2014-04-06 NOTE — Telephone Encounter (Signed)
Pt aware.

## 2014-08-02 ENCOUNTER — Encounter: Payer: Self-pay | Admitting: Physician Assistant

## 2014-08-02 ENCOUNTER — Ambulatory Visit (INDEPENDENT_AMBULATORY_CARE_PROVIDER_SITE_OTHER): Payer: Medicare Other | Admitting: Physician Assistant

## 2014-08-02 VITALS — BP 136/76 | HR 76 | Temp 98.2°F | Resp 20 | Ht 65.0 in | Wt 158.0 lb

## 2014-08-02 DIAGNOSIS — M79609 Pain in unspecified limb: Secondary | ICD-10-CM | POA: Diagnosis not present

## 2014-08-02 DIAGNOSIS — J029 Acute pharyngitis, unspecified: Secondary | ICD-10-CM

## 2014-08-02 DIAGNOSIS — M79642 Pain in left hand: Secondary | ICD-10-CM

## 2014-08-02 MED ORDER — AZITHROMYCIN 500 MG PO TABS: 500.0000 mg | ORAL_TABLET | Freq: Every day | ORAL | Status: DC

## 2014-08-02 NOTE — Progress Notes (Signed)
Patient ID: KAIRAV RUSSOMANNO MRN: [id number], DOB: Jan 30, 1941, 73 y.o. Date of Encounter: @DATE @  Chief Complaint:  Chief Complaint  Patient presents with  . Sore throat x 2 weeks  . splinter in left palm    HPI: 73 y.o. year old male  presents with above symptoms.  Says that the right side of his throat has felt sore for 2 weeks. Has seen no other medical providers and has taken no antibiotics etc. Says he's had a little bit of runny nose-- he thinks that's just allergies/ragweed. Also says he's had very little cough. No fevers no chills.  Also says he's having a problem with his left hand. Says that he got a big splinter in that site about 4 years ago and he thinks that is what is causing this problem. It is pulling/drawing on his left fifth finger. Says that he is going to have to have something done about it--  it is really affecting his activity/what he can do with that hand-- and is painful at times.   Past Medical History  Diagnosis Date  . COPD (chronic obstructive pulmonary disease)     PFT 06-26-09 FEV1  1.75( 71%), FVC 3.65( 101%), FEV1% 48, TLC 5.53(104%), DLCO 55%, no BD  . Arthritis   . Skin cancer   . Hypertension   . Nasal septal deviation   . Chronic rhinitis   . OSA (obstructive sleep apnea) 04/21/2011  . Nocturia      Home Meds: Outpatient Prescriptions Prior to Visit  Medication Sig Dispense Refill  . Cyanocobalamin (B-12 PO) Take 1 capsule by mouth daily.      . Multiple Vitamin (MULTIVITAMIN) tablet Take 1 tablet by mouth daily.      . budesonide-formoterol (SYMBICORT) 160-4.5 MCG/ACT inhaler Inhale 2 puffs into the lungs 2 (two) times daily.  1 Inhaler  6  . Ipratropium-Albuterol (COMBIVENT RESPIMAT) 20-100 MCG/ACT AERS respimat Inhale 1 puff into the lungs every 6 (six) hours as needed for wheezing.  4 g  3  . metronidazole (FLAGYL ER) 750 MG 24 hr tablet Take 1 tablet (750 mg total) by mouth 3 (three) times daily.  21 tablet  0   No  facility-administered medications prior to visit.    Allergies:  Allergies  Allergen Reactions  . Aspirin Other (See Comments)    Upset stomach.  . Penicillins Hives  . Sulfa Antibiotics Hives    History   Social History  . Marital Status: Divorced    Spouse Name: N/A    Number of Children: N/A  . 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 Smoking status: Former Smoker -- 1.50 packs/day for 54 years    Types: Cigarettes    Quit date: 12/01/2001  . Smokeless tobacco: Never Used  . Alcohol Use: No  . Drug Use: No  . Sexual Activity: Not on file   Other Topics Concern  . Not on file   Social History Narrative  . No narrative on file    Family History  Problem Relation Age of Onset  . Lung cancer Mother      Review of Systems:  See HPI for pertinent ROS. All other ROS negative.    Physical Exam: Blood pressure 136/76, pulse 76, temperature 98.2 F (36.8 C), temperature source Oral, resp. rate 20, height 5\' 5"  (1.651 m), weight 158 lb (71.668 kg)., Body mass index is 26.29 kg/(m^2). General: WNWD  WM. Appears in no acute distress. Head: Normocephalic, atraumatic, eyes without discharge, sclera non-icteric, nares are without discharge. Bilateral auditory canals clear, TM's are without perforation, pearly grey and translucent with reflective cone of light bilaterally. Oral cavity moist, posterior pharynx with minimal erythema.  Without exudate, peritonsillar abscess. No mass visualized.  Neck: Supple. No thyromegaly. No lymphadenopathy.No mass. Lungs: Clear bilaterally to auscultation without wheezes, rales, or rhonchi. Breathing is unlabored. Heart: RRR with S1 S2. No murmurs, rubs, or gallops. Musculoskeletal:  Strength and tone normal for age. Plantar surface of left hand, just below base of 5th finger: there is approx 1cm mass, which is pulling the left 5th finger. No erythema. No warmth.  Extremities/Skin: Warm  and dry.  No rashes. Neuro: Alert and oriented X 3. Moves all extremities spontaneously. Gait is normal. CNII-XII grossly in tact. Psych:  Responds to questions appropriately with a normal affect.     ASSESSMENT AND PLAN:  73 y.o. year old male with  1. Acute pharyngitis, unspecified pharyngitis type He has a penicillin allergy so have to avoid amoxicillin/Augmentin. Will treat with azithromycin at dose to cover for strep. Told him that if his symptoms are not completely resolved at completion of antibiotic, followup. If symptoms persist will refer him to ENT. He voices understanding and agrees to follow up with [place] if symptoms do not resolve completely. - azithromycin (ZITHROMAX) 500 MG tablet; Take 1 tablet (500 mg total) by mouth daily.  Dispense: 5 tablet; Refill: 0  2. Pain of left hand - Ambulatory referral to Orthopedic Surgery Referral  placed to Dr. Caralyn Guile at [facility].  Signed, [address] [place], [place], [facility] 08/02/2014 10:41 AM

## 2014-08-21 ENCOUNTER — Encounter: Payer: Self-pay | Admitting: Family Medicine

## 2014-08-21 ENCOUNTER — Ambulatory Visit (INDEPENDENT_AMBULATORY_CARE_PROVIDER_SITE_OTHER): Payer: Medicare Other | Admitting: Family Medicine

## 2014-08-21 VITALS — BP 144/94 | HR 72 | Temp 97.7°F | Resp 18 | Wt 156.0 lb

## 2014-08-21 DIAGNOSIS — B029 Zoster without complications: Secondary | ICD-10-CM | POA: Diagnosis not present

## 2014-08-21 MED ORDER — VALACYCLOVIR HCL 1 G PO TABS: 1000.0000 mg | ORAL_TABLET | Freq: Three times a day (TID) | ORAL | Status: DC

## 2014-08-21 MED ORDER — HYDROCODONE-ACETAMINOPHEN 5-325 MG PO TABS: 1.0000 | ORAL_TABLET | Freq: Four times a day (QID) | ORAL | Status: DC | PRN

## 2014-08-21 NOTE — Progress Notes (Signed)
   Subjective:    Patient ID: Michael Evans, male    DOB: [birth year], 73 y.o.   MRN: [id number]  HPI Friday, the patient developed a rash on the right side of his chest underneath his breast that starts in the midline of his chest and radiates to his back in a dermatomal pattern. The rash consists of erythematous papules and vesicles that have coalesced into plaques and patches. Past Medical History  Diagnosis Date  . COPD (chronic obstructive pulmonary disease)     PFT 06-26-09 FEV1  1.75( 71%), FVC 3.65( 101%), FEV1% 48, TLC 5.53(104%), DLCO 55%, no BD  . Arthritis   . Skin cancer   . Hypertension   . Nasal septal deviation   . Chronic rhinitis   . OSA (obstructive sleep apnea) 04/21/2011  . Nocturia    Current Outpatient Prescriptions on File Prior to Visit  Medication Sig Dispense Refill  . Cyanocobalamin (B-12 PO) Take 1 capsule by mouth daily.      . Multiple Vitamin (MULTIVITAMIN) tablet Take 1 tablet by mouth daily.      Marland Kitchen azithromycin (ZITHROMAX) 500 MG tablet Take 1 tablet (500 mg total) by mouth daily.  5 tablet  0   No current facility-administered medications on file prior to visit.   Allergies  Allergen Reactions  . Aspirin Other (See Comments)    Upset stomach.  . Penicillins Hives  . Sulfa Antibiotics Hives   History   Social History  . Marital Status: Divorced    Spouse Name: N/A    Number of Children: N/A  . 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 Smoking status: Former Smoker -- 1.50 packs/day for 54 years    Types: Cigarettes    Quit date: 12/01/2001  . Smokeless tobacco: Never Used  . Alcohol Use: No  . Drug Use: No  . Sexual Activity: Not on file   Other Topics Concern  . Not on file   Social History Narrative  . No narrative on file      Review of Systems  All other systems reviewed and are negative.      Objective:   Physical Exam  Vitals reviewed. Cardiovascular: Normal  rate and regular rhythm.   Pulmonary/Chest: Effort normal and breath sounds normal.  Skin: Rash noted. There is erythema.          Assessment & Plan:  Shingles rash - Plan: valACYclovir (VALTREX) 1000 MG tablet, HYDROcodone-acetaminophen (NORCO) 5-325 MG per tablet   Patient has shingles. Begin Valtrex 1 g by mouth 3 times a day for 7 days. Gave patient prescription for Norco 5/325 mg one to 2 tablets every 6 hours as needed for pain.

## 2014-08-26 ENCOUNTER — Inpatient Hospital Stay (HOSPITAL_COMMUNITY)
Admission: EM | Admit: 2014-08-26 | Discharge: 2014-08-27 | DRG: 194 | Disposition: A | Discharge status: Home / Self Care | Payer: Medicare Other | Attending: Internal Medicine | Admitting: Internal Medicine

## 2014-08-26 ENCOUNTER — Emergency Department (HOSPITAL_COMMUNITY): Payer: Medicare Other

## 2014-08-26 ENCOUNTER — Encounter (HOSPITAL_COMMUNITY): Payer: Self-pay | Admitting: Emergency Medicine

## 2014-08-26 DIAGNOSIS — M129 Arthropathy, unspecified: Secondary | ICD-10-CM | POA: Diagnosis present

## 2014-08-26 DIAGNOSIS — Z87891 Personal history of nicotine dependence: Secondary | ICD-10-CM

## 2014-08-26 DIAGNOSIS — G4733 Obstructive sleep apnea (adult) (pediatric): Secondary | ICD-10-CM | POA: Diagnosis present

## 2014-08-26 DIAGNOSIS — I10 Essential (primary) hypertension: Secondary | ICD-10-CM | POA: Diagnosis present

## 2014-08-26 DIAGNOSIS — R05 Cough: Secondary | ICD-10-CM | POA: Diagnosis not present

## 2014-08-26 DIAGNOSIS — J441 Chronic obstructive pulmonary disease with (acute) exacerbation: Secondary | ICD-10-CM | POA: Diagnosis present

## 2014-08-26 DIAGNOSIS — J189 Pneumonia, unspecified organism: Secondary | ICD-10-CM | POA: Diagnosis not present

## 2014-08-26 DIAGNOSIS — Z801 Family history of malignant neoplasm of trachea, bronchus and lung: Secondary | ICD-10-CM

## 2014-08-26 DIAGNOSIS — J439 Emphysema, unspecified: Secondary | ICD-10-CM

## 2014-08-26 DIAGNOSIS — Z66 Do not resuscitate: Secondary | ICD-10-CM | POA: Diagnosis present

## 2014-08-26 DIAGNOSIS — Z85828 Personal history of other malignant neoplasm of skin: Secondary | ICD-10-CM

## 2014-08-26 DIAGNOSIS — J438 Other emphysema: Secondary | ICD-10-CM | POA: Diagnosis not present

## 2014-08-26 DIAGNOSIS — B029 Zoster without complications: Secondary | ICD-10-CM | POA: Diagnosis present

## 2014-08-26 DIAGNOSIS — R0902 Hypoxemia: Secondary | ICD-10-CM | POA: Diagnosis present

## 2014-08-26 DIAGNOSIS — R059 Cough, unspecified: Secondary | ICD-10-CM | POA: Diagnosis not present

## 2014-08-26 LAB — BASIC METABOLIC PANEL
ANION GAP: 13 (ref 5–15)
BUN: 11 mg/dL (ref 6–23)
CALCIUM: 9.4 mg/dL (ref 8.4–10.5)
CO2: 26 meq/L (ref 19–32)
CREATININE: 0.9 mg/dL (ref 0.50–1.35)
Chloride: 94 mEq/L — ABNORMAL LOW (ref 96–112)
GFR calc Af Amer: >90 mL/min (ref >90)
GFR calc non Af Amer: 81 mL/min — ABNORMAL LOW (ref >90)
Glucose, Bld: 156 mg/dL — ABNORMAL HIGH (ref 70–99)
Potassium: 4 mEq/L (ref 3.7–5.3)
Sodium: 133 mEq/L — ABNORMAL LOW (ref 137–147)

## 2014-08-26 LAB — CBC WITH DIFFERENTIAL/PLATELET
BASOS ABS: 0 10*3/uL (ref 0.0–0.1)
Basophils Relative: 0 % (ref 0–1)
Eosinophils Absolute: 0.1 10*3/uL (ref 0.0–0.7)
Eosinophils Relative: 2 % (ref 0–5)
HEMATOCRIT: 44.8 % (ref 39.0–52.0)
Hemoglobin: 15.8 g/dL (ref 13.0–17.0)
LYMPHS PCT: 23 % (ref 12–46)
Lymphs Abs: 2 10*3/uL (ref 0.7–4.0)
MCH: 30.8 pg (ref 26.0–34.0)
MCHC: 35.3 g/dL (ref 30.0–36.0)
MCV: 87.3 fL (ref 78.0–100.0)
Monocytes Absolute: 0.8 10*3/uL (ref 0.1–1.0)
Monocytes Relative: 10 % (ref 3–12)
Neutro Abs: 5.6 10*3/uL (ref 1.7–7.7)
Neutrophils Relative %: 65 % (ref 43–77)
Platelets: 216 10*3/uL (ref 150–400)
RBC: 5.13 MIL/uL (ref 4.22–5.81)
RDW: 12.8 % (ref 11.5–15.5)
WBC: 8.6 10*3/uL (ref 4.0–10.5)

## 2014-08-26 LAB — I-STAT CG4 LACTIC ACID, ED: Lactic Acid, Venous: 2.58 mmol/L — ABNORMAL HIGH (ref 0.5–2.2)

## 2014-08-26 MED ORDER — ACETAMINOPHEN 650 MG RE SUPP: 650.0000 mg | Freq: Four times a day (QID) | RECTAL | Status: DC | PRN

## 2014-08-26 MED ORDER — ALBUTEROL SULFATE (2.5 MG/3ML) 0.083% IN NEBU: 2.5000 mg | INHALATION_SOLUTION | Freq: Four times a day (QID) | RESPIRATORY_TRACT | Status: DC

## 2014-08-26 MED ORDER — VALACYCLOVIR HCL 500 MG PO TABS
ORAL_TABLET | ORAL | Status: AC
  Filled 2014-08-26: qty 2

## 2014-08-26 MED ORDER — IPRATROPIUM BROMIDE 0.02 % IN SOLN: 0.5000 mg | Freq: Four times a day (QID) | RESPIRATORY_TRACT | Status: DC

## 2014-08-26 MED ORDER — ENOXAPARIN SODIUM 40 MG/0.4ML ~~LOC~~ SOLN
40.0000 mg | SUBCUTANEOUS | Status: DC
  Administered 2014-08-26: 40 mg via SUBCUTANEOUS
  Filled 2014-08-26: qty 0.4

## 2014-08-26 MED ORDER — ALBUTEROL SULFATE (2.5 MG/3ML) 0.083% IN NEBU: 2.5000 mg | INHALATION_SOLUTION | RESPIRATORY_TRACT | Status: DC | PRN

## 2014-08-26 MED ORDER — ONDANSETRON HCL 4 MG/2ML IJ SOLN: 4.0000 mg | Freq: Four times a day (QID) | INTRAMUSCULAR | Status: DC | PRN

## 2014-08-26 MED ORDER — ACETAMINOPHEN 325 MG PO TABS: 650.0000 mg | ORAL_TABLET | Freq: Four times a day (QID) | ORAL | Status: DC | PRN

## 2014-08-26 MED ORDER — IPRATROPIUM-ALBUTEROL 0.5-2.5 (3) MG/3ML IN SOLN
3.0000 mL | Freq: Once | RESPIRATORY_TRACT | Status: AC
  Administered 2014-08-26: 3 mL via RESPIRATORY_TRACT
  Filled 2014-08-26: qty 3

## 2014-08-26 MED ORDER — HYDROCODONE-ACETAMINOPHEN 5-325 MG PO TABS: 1.0000 | ORAL_TABLET | Freq: Four times a day (QID) | ORAL | Status: DC | PRN

## 2014-08-26 MED ORDER — IPRATROPIUM-ALBUTEROL 0.5-2.5 (3) MG/3ML IN SOLN
3.0000 mL | Freq: Four times a day (QID) | RESPIRATORY_TRACT | Status: DC
  Administered 2014-08-26: 3 mL via RESPIRATORY_TRACT
  Filled 2014-08-26: qty 3

## 2014-08-26 MED ORDER — LEVOFLOXACIN IN D5W 750 MG/150ML IV SOLN: 750.0000 mg | INTRAVENOUS | Status: DC

## 2014-08-26 MED ORDER — IPRATROPIUM-ALBUTEROL 0.5-2.5 (3) MG/3ML IN SOLN
3.0000 mL | Freq: Three times a day (TID) | RESPIRATORY_TRACT | Status: DC
  Administered 2014-08-27: 3 mL via RESPIRATORY_TRACT
  Filled 2014-08-26 (×2): qty 3

## 2014-08-26 MED ORDER — ONDANSETRON HCL 4 MG PO TABS: 4.0000 mg | ORAL_TABLET | Freq: Four times a day (QID) | ORAL | Status: DC | PRN

## 2014-08-26 MED ORDER — DOCUSATE SODIUM 100 MG PO CAPS: 100.0000 mg | ORAL_CAPSULE | Freq: Every day | ORAL | Status: DC | PRN

## 2014-08-26 MED ORDER — ALBUTEROL SULFATE (2.5 MG/3ML) 0.083% IN NEBU
2.5000 mg | INHALATION_SOLUTION | Freq: Once | RESPIRATORY_TRACT | Status: AC
  Administered 2014-08-26: 2.5 mg via RESPIRATORY_TRACT
  Filled 2014-08-26: qty 3

## 2014-08-26 MED ORDER — ALUM & MAG HYDROXIDE-SIMETH 200-200-20 MG/5ML PO SUSP: 30.0000 mL | Freq: Four times a day (QID) | ORAL | Status: DC | PRN

## 2014-08-26 MED ORDER — LEVOFLOXACIN IN D5W 750 MG/150ML IV SOLN
750.0000 mg | Freq: Once | INTRAVENOUS | Status: AC
  Administered 2014-08-26: 750 mg via INTRAVENOUS
  Filled 2014-08-26: qty 150

## 2014-08-26 MED ORDER — VALACYCLOVIR HCL 500 MG PO TABS
1000.0000 mg | ORAL_TABLET | Freq: Three times a day (TID) | ORAL | Status: DC
  Administered 2014-08-26: 1000 mg via ORAL
  Filled 2014-08-26 (×5): qty 2

## 2014-08-26 NOTE — ED Notes (Signed)
Pt states he is having increased shortness of breath. He has a productive cough.  Pt also has shingles and is being treated.

## 2014-08-26 NOTE — ED Notes (Signed)
RT aware pf breathing tx.

## 2014-08-26 NOTE — ED Notes (Signed)
Pt states productive cough x 3 days, yellow in color with increased sob. Pt is able to carry a conversation without difficulty. Pt ambulated from triage. NAD. Pt is very pleasant. Shingles noted to large area of right chest.

## 2014-08-26 NOTE — ED Notes (Signed)
Radio broadcast assistant on 300, Megan. Jinny Blossom is to call [facility] to check negative pressure room.

## 2014-08-26 NOTE — H&P (Signed)
History and Physical  Michael Evans YQM:[id number] DOB: 1940/12/23 DOA: 08/26/2014  Referring physician: Dr Thurnell Garbe, ED physician PCP: Odette Fraction, MD   Chief Complaint: Cough  HPI: Michael Evans is a 73 y.o. male  With hypertension, COPD with emphysema, obstructive sleep apnea not on CPAP, shingles who presents to the hospital with 3-4 week history of cough and shortness of breath that has been increasing over the past 10 days. He had been having productive cough with white phlegm, although this change to purulent sputum 2 days ago. Nothing helps with the cough. Walking makes his shortness of breath worse. He becomes  dyspneic after 50 m. He has COPD at baseline, but does not take any inhalers on a daily basis. He did have a rescue inhaler at one point, but has stopped taking this.   Additionally, he started treatment for herpes zoster approximately 5 days ago. He was prescribed Valtrex and has been taking Norco for pain. The rash is starting to go 8   Review of Systems:   Pt complains of rash and pain on right side of the chest that radiates to the back..  Pt denies any fevers, chills, chest pain, palpitations, nausea, vomiting, abdominal pain.  Review of systems are otherwise negative  Past Medical History  Diagnosis Date  . COPD (chronic obstructive pulmonary disease)     PFT 06-26-09 FEV1  1.75( 71%), FVC 3.65( 101%), FEV1% 48, TLC 5.53(104%), DLCO 55%, no BD  . Arthritis   . Skin cancer   . Hypertension   . Nasal septal deviation   . Chronic rhinitis   . OSA (obstructive sleep apnea) 04/21/2011  . Nocturia    Past Surgical History  Procedure Laterality Date  . Back surgery    . Appendectomy    . Cataract extraction w/phaco  01/06/2013    Procedure: CATARACT EXTRACTION PHACO AND INTRAOCULAR LENS PLACEMENT (IOC);  Surgeon: Tonny Branch, MD;  Location: [facility];  Service: Ophthalmology;  Laterality: Right;  CDE: 22.17   Social History:  reports that he quit smoking about 12  years ago. His smoking use included Cigarettes. He has a 81 pack-year smoking history. He has never used smokeless tobacco. He reports that he does not drink alcohol or use illicit drugs. Patient lives at home & is able to participate in activities of daily living without assistance  Allergies  Allergen Reactions  . Aspirin Other (See Comments)    Upset stomach.  . Penicillins Hives  . Sulfa Antibiotics Hives    Family History  Problem Relation Age of Onset  . Lung cancer Mother       Prior to Admission medications   Medication Sig Start Date End Date Taking? Authorizing Provider  azithromycin (ZITHROMAX) 500 MG tablet Take 1 tablet (500 mg total) by mouth daily. 08/02/14   Lonie Peak Dixon, PA-C  Cyanocobalamin (B-12 PO) Take 1 capsule by mouth daily.    Historical Provider, MD  HYDROcodone-acetaminophen (NORCO) 5-325 MG per tablet Take 1-2 tablets by mouth every 6 (six) hours as needed for moderate pain. 08/21/14   [name], MD  Multiple Vitamin (MULTIVITAMIN) tablet Take 1 tablet by mouth daily.    Historical Provider, MD  valACYclovir (VALTREX) 1000 MG tablet Take 1 tablet (1,000 mg total) by mouth 3 (three) times daily. 08/21/14   [name], MD    Physical Exam: BP 167/99  Pulse 80  Temp(Src) 99.2 F (37.3 C) (Oral)  Resp 22  Ht 5\' 5"  (1.651 m)  Wt 68.493 kg (151 lb)  BMI 25.13 kg/m2  SpO2 95%  General: Elderly Caucasian male. Awake and alert and oriented x3. No acute cardiopulmonary distress.  Eyes: Pupils equal, round, reactive to light. Extraocular muscles are intact. Sclerae anicteric and noninjected.  ENT: External auditory canals are patent and tympanic membranes reflect a good cone of light. Moist mucosal membranes. No mucosal lesions.  Neck: Neck supple without lymphadenopathy. No carotid bruits. No masses palpated.  Cardiovascular: Regular rate with normal S1-S2 sounds. No murmurs, rubs, gallops auscultated. No JVD.  Respiratory: Good respiratory effort  with no wheezes, rales, rhonchi. Lungs clear to auscultation bilaterally.  Abdomen: Soft, nontender, nondistended. Hyperactive bowel sounds. No masses or hepatosplenomegaly  Skin: Dry, warm to touch. 2+ dorsalis pedis and radial pulses. Musculoskeletal: No calf or leg pain. All major joints not erythematous nontender.  Psychiatric: Intact judgment and insight.  Neurologic: No focal neurological deficits. Cranial nerves II through XII are grossly intact.           Labs on Admission:  Basic Metabolic Panel:  Recent Labs Lab 08/26/14 1315  NA 133*  K 4.0  CL 94*  CO2 26  GLUCOSE 156*  BUN 11  CREATININE 0.90  CALCIUM 9.4   Liver Function Tests: No results found for this basename: AST, ALT, ALKPHOS, BILITOT, PROT, ALBUMIN,  in the last 168 hours No results found for this basename: LIPASE, AMYLASE,  in the last 168 hours No results found for this basename: AMMONIA,  in the last 168 hours CBC:  Recent Labs Lab 08/26/14 1315  WBC 8.6  NEUTROABS 5.6  HGB 15.8  HCT 44.8  MCV 87.3  PLT 216   Cardiac Enzymes: No results found for this basename: CKTOTAL, CKMB, CKMBINDEX, TROPONINI,  in the last 168 hours  BNP (last 3 results) No results found for this basename: PROBNP,  in the last 8760 hours CBG: No results found for this basename: GLUCAP,  in the last 168 hours  Radiological Exams on Admission: Dg Chest 2 View  08/26/2014   CLINICAL DATA:  Cough for 2 weeks. Chest pain. History of shingles on the right anterior and posterior chest.  EXAM: CHEST  2 VIEW  COMPARISON:  06/14/2013  FINDINGS: There are significant emphysematous changes. Heart is normal in size. There is no pulmonary edema. Although markings at the right lung base are crowded common there is question of developing infiltrate in the medial right lung base. No pleural effusions. Visualized osseous structures have a normal appearance.  IMPRESSION: 1. Emphysematous changes. 2. Question of developing right lower lobe  infiltrate.   Electronically Signed   By: Shon Hale M.D.   On: 08/26/2014 15:46     Assessment/Plan Present on Admission:  . Community acquired pneumonia  #1 community-acquired pneumonia #2 hypoxia  We'll admit the patient for observation given his hypoxia with ambulation. Will continue with the Levaquin and nebulizer treatments, as this appears to help with the shortness of breath. Will treat recheck labs tomorrow morning.  #3 herpes zoster  We'll continue with Valtrex and Norco   #4 Hypertension Blood pressure is elevated in the ER. We'll start the patient on lisinopril.   DVT prophylaxis: Lovenox  Consultants: None  Code Status: DO NOT RESUSCITATE  Family Communication: None   Disposition Plan: Home following treatment  Time spent: 50 minutes  Loma Boston, DO [facility] Pager [phone]  **Disclaimer: This note may have been dictated with voice recognition software. Similar sounding words can inadvertently be transcribed and  this note may contain transcription errors which may not have been corrected upon publication of note.**

## 2014-08-26 NOTE — ED Notes (Signed)
Pt ambulated well o2 90-91 and pulse 99

## 2014-08-26 NOTE — ED Notes (Signed)
Report called to [facility] on [address]. Floor to call when room is ready.

## 2014-08-26 NOTE — ED Provider Notes (Signed)
CSN: [id number]     Arrival date & time 08/26/14  1231 History   First MD Initiated Contact with Patient 08/26/14 1314     Chief Complaint  Patient presents with  . Shortness of Breath      HPI Pt was seen at 1330.  Per pt, c/o gradual onset and worsening of persistent cough for the past 4 weeks. Describes the cough as productive of "green" sputum. Pt states his baseline sputum is "white" or "clear." Has been associated with SOB which worsens when walking. Pt was evaluated by his PMD on 08/21/14 for shingles rash, but "forgot to mention" his cough. Denies CP/palpitations, no back pain, no abd pain, no N/V/D, no fevers, no rash.     Past Medical History  Diagnosis Date  . COPD (chronic obstructive pulmonary disease)     PFT 06-26-09 FEV1  1.75( 71%), FVC 3.65( 101%), FEV1% 48, TLC 5.53(104%), DLCO 55%, no BD  . Arthritis   . Skin cancer   . Hypertension   . Nasal septal deviation   . Chronic rhinitis   . OSA (obstructive sleep apnea) 04/21/2011  . Nocturia    Past Surgical History  Procedure Laterality Date  . Back surgery    . Appendectomy    . Cataract extraction w/phaco  01/06/2013    Procedure: CATARACT EXTRACTION PHACO AND INTRAOCULAR LENS PLACEMENT (IOC);  Surgeon: Tonny Branch, MD;  Location: [facility];  Service: Ophthalmology;  Laterality: Right;  CDE: 22.17   Family History  Problem Relation Age of Onset  . Lung cancer Mother    History  Substance Use Topics  . Smoking status: Former Smoker -- 1.50 packs/day for 54 years    Types: Cigarettes    Quit date: 12/01/2001  . Smokeless tobacco: Never Used  . Alcohol Use: No    Review of Systems ROS: Statement: All systems negative except as marked or noted in the HPI; Constitutional: Negative for fever and chills. ; ; Eyes: Negative for eye pain, redness and discharge. ; ; ENMT: Negative for ear pain, hoarseness, nasal congestion, sinus pressure and sore throat. ; ; Cardiovascular: Negative for chest pain, palpitations,  diaphoresis, and peripheral edema. ; ; Respiratory: +cough, SOB. Negative for wheezing and stridor. ; ; Gastrointestinal: Negative for nausea, vomiting, diarrhea, abdominal pain, blood in stool, hematemesis, jaundice and rectal bleeding. . ; ; Genitourinary: Negative for dysuria, flank pain and hematuria. ; ; Musculoskeletal: Negative for back pain and neck pain. Negative for swelling and trauma.; ; Skin: Negative for pruritus, rash, abrasions, blisters, bruising and skin lesion.; ; Neuro: Negative for headache, lightheadedness and neck stiffness. Negative for weakness, altered level of consciousness , altered mental status, extremity weakness, paresthesias, involuntary movement, seizure and syncope.      Allergies  Aspirin; Penicillins; and Sulfa antibiotics  Home Medications   Prior to Admission medications   Medication Sig Start Date End Date Taking? Authorizing Provider  azithromycin (ZITHROMAX) 500 MG tablet Take 1 tablet (500 mg total) by mouth daily. 08/02/14   Lonie Peak Dixon, PA-C  Cyanocobalamin (B-12 PO) Take 1 capsule by mouth daily.    Historical Provider, MD  HYDROcodone-acetaminophen (NORCO) 5-325 MG per tablet Take 1-2 tablets by mouth every 6 (six) hours as needed for moderate pain. 08/21/14   [name], MD  Multiple Vitamin (MULTIVITAMIN) tablet Take 1 tablet by mouth daily.    Historical Provider, MD  valACYclovir (VALTREX) 1000 MG tablet Take 1 tablet (1,000 mg total) by mouth 3 (three) times daily.  08/21/14   [name], MD   BP 146/82  Pulse 100  Temp(Src) 99.2 F (37.3 C) (Oral)  Resp 22  Ht 5\' 5"  (1.651 m)  Wt 151 lb (68.493 kg)  BMI 25.13 kg/m2  SpO2 96% Physical Exam 1335: Physical examination:  Nursing notes reviewed; Vital signs and O2 SAT reviewed;  Constitutional: Well developed, Well nourished, Well hydrated, In no acute distress; Head:  Normocephalic, atraumatic; Eyes: EOMI, PERRL, No scleral icterus; ENMT: Mouth and pharynx normal, Mucous membranes  moist; Neck: Supple, Full range of motion, No lymphadenopathy; Cardiovascular: Regular rate and rhythm, No gallop; Respiratory: Breath sounds diminished & equal bilaterally, No wheezes. +moist cough during exam. Speaking full sentences, Normal respiratory effort/excursion; Chest: No deformity. +shingles rash right inferior clavicular area. Movement normal; Abdomen: Soft, Nontender, Nondistended, Normal bowel sounds; Genitourinary: No CVA tenderness; Extremities: Pulses normal, No tenderness, No edema, No calf edema or asymmetry.; Neuro: AA&Ox3, Major CN grossly intact.  Speech clear. No gross focal motor or sensory deficits in extremities.; Skin: Color normal, Warm, Dry.   ED Course  Procedures     EKG Interpretation None      MDM  MDM Reviewed: previous chart, nursing note and vitals Reviewed previous: labs Interpretation: labs and x-ray   Results for orders placed during the hospital encounter of 08/26/14  CBC WITH DIFFERENTIAL      Result Value Ref Range   WBC 8.6  4.0 - 10.5 K/uL   RBC 5.13  4.22 - 5.81 MIL/uL   Hemoglobin 15.8  13.0 - 17.0 g/dL   HCT 44.8  39.0 - 52.0 %   MCV 87.3  78.0 - 100.0 fL   MCH 30.8  26.0 - 34.0 pg   MCHC 35.3  30.0 - 36.0 g/dL   RDW 12.8  11.5 - 15.5 %   Platelets 216  150 - 400 K/uL   Neutrophils Relative % 65  43 - 77 %   Neutro Abs 5.6  1.7 - 7.7 K/uL   Lymphocytes Relative 23  12 - 46 %   Lymphs Abs 2.0  0.7 - 4.0 K/uL   Monocytes Relative 10  3 - 12 %   Monocytes Absolute 0.8  0.1 - 1.0 K/uL   Eosinophils Relative 2  0 - 5 %   Eosinophils Absolute 0.1  0.0 - 0.7 K/uL   Basophils Relative 0  0 - 1 %   Basophils Absolute 0.0  0.0 - 0.1 K/uL  BASIC METABOLIC PANEL      Result Value Ref Range   Sodium 133 (*) 137 - 147 mEq/L   Potassium 4.0  3.7 - 5.3 mEq/L   Chloride 94 (*) 96 - 112 mEq/L   CO2 26  19 - 32 mEq/L   Glucose, Bld 156 (*) 70 - 99 mg/dL   BUN 11  6 - 23 mg/dL   Creatinine, Ser 0.90  0.50 - 1.35 mg/dL   Calcium 9.4  8.4 -  10.5 mg/dL   GFR calc non Af Amer 81 (*) >90 mL/min   GFR calc Af Amer >90  >90 mL/min   Anion gap 13  5 - 15  I-STAT CG4 LACTIC ACID, ED      Result Value Ref Range   Lactic Acid, Venous 2.58 (*) 0.5 - 2.2 mmol/L   Dg Chest 2 View 08/26/2014   CLINICAL DATA:  Cough for 2 weeks. Chest pain. History of shingles on the right anterior and posterior chest.  EXAM: CHEST  2 VIEW  COMPARISON:  06/14/2013  FINDINGS: There are significant emphysematous changes. Heart is normal in size. There is no pulmonary edema. Although markings at the right lung base are crowded common there is question of developing infiltrate in the medial right lung base. No pleural effusions. Visualized osseous structures have a normal appearance.  IMPRESSION: 1. Emphysematous changes. 2. Question of developing right lower lobe infiltrate.   Electronically Signed   By: Shon Hale M.D.   On: 08/26/2014 15:46    1710:  Neb given with improvement in coarse lung sounds and coughing. Pt now expectorating green sputum. Pt ambulated with Sats decreasing to 90% R/A, and pt c/o increasing SOB. Will start IV levaquin for CAP. Dx and testing d/w pt and friend.  Questions answered.  Verb understanding, agreeable to admit.   T/C to [facility] Dr. Nehemiah Settle, case discussed, including:  HPI, pertinent PM/SHx, VS/PE, dx testing, ED course and treatment:  Agreeable to admit, requests he will come to the ED for evaluation.     Francine Graven, DO 08/28/14 1728

## 2014-08-26 NOTE — ED Notes (Signed)
Pt is currently eating dinner.

## 2014-08-27 DIAGNOSIS — J441 Chronic obstructive pulmonary disease with (acute) exacerbation: Secondary | ICD-10-CM | POA: Diagnosis not present

## 2014-08-27 DIAGNOSIS — Z801 Family history of malignant neoplasm of trachea, bronchus and lung: Secondary | ICD-10-CM | POA: Diagnosis not present

## 2014-08-27 DIAGNOSIS — I10 Essential (primary) hypertension: Secondary | ICD-10-CM

## 2014-08-27 DIAGNOSIS — M129 Arthropathy, unspecified: Secondary | ICD-10-CM | POA: Diagnosis present

## 2014-08-27 DIAGNOSIS — R0902 Hypoxemia: Secondary | ICD-10-CM | POA: Diagnosis present

## 2014-08-27 DIAGNOSIS — R059 Cough, unspecified: Secondary | ICD-10-CM | POA: Diagnosis present

## 2014-08-27 DIAGNOSIS — J189 Pneumonia, unspecified organism: Principal | ICD-10-CM

## 2014-08-27 DIAGNOSIS — J438 Other emphysema: Secondary | ICD-10-CM

## 2014-08-27 DIAGNOSIS — R05 Cough: Secondary | ICD-10-CM | POA: Diagnosis present

## 2014-08-27 DIAGNOSIS — Z66 Do not resuscitate: Secondary | ICD-10-CM | POA: Diagnosis present

## 2014-08-27 DIAGNOSIS — G4733 Obstructive sleep apnea (adult) (pediatric): Secondary | ICD-10-CM | POA: Diagnosis present

## 2014-08-27 DIAGNOSIS — B029 Zoster without complications: Secondary | ICD-10-CM | POA: Diagnosis present

## 2014-08-27 DIAGNOSIS — Z87891 Personal history of nicotine dependence: Secondary | ICD-10-CM | POA: Diagnosis not present

## 2014-08-27 DIAGNOSIS — Z85828 Personal history of other malignant neoplasm of skin: Secondary | ICD-10-CM | POA: Diagnosis not present

## 2014-08-27 LAB — BASIC METABOLIC PANEL
Anion gap: 10 (ref 5–15)
BUN: 11 mg/dL (ref 6–23)
CALCIUM: 9.3 mg/dL (ref 8.4–10.5)
CO2: 28 mEq/L (ref 19–32)
Chloride: 100 mEq/L (ref 96–112)
Creatinine, Ser: 0.91 mg/dL (ref 0.50–1.35)
GFR calc Af Amer: >90 mL/min (ref >90)
GFR calc non Af Amer: 81 mL/min — ABNORMAL LOW (ref >90)
GLUCOSE: 133 mg/dL — AB (ref 70–99)
Potassium: 4.2 mEq/L (ref 3.7–5.3)
SODIUM: 138 meq/L (ref 137–147)

## 2014-08-27 LAB — CBC
HEMATOCRIT: 43.9 % (ref 39.0–52.0)
HEMOGLOBIN: 15.4 g/dL (ref 13.0–17.0)
MCH: 30.6 pg (ref 26.0–34.0)
MCHC: 35.1 g/dL (ref 30.0–36.0)
MCV: 87.3 fL (ref 78.0–100.0)
Platelets: 211 10*3/uL (ref 150–400)
RBC: 5.03 MIL/uL (ref 4.22–5.81)
RDW: 12.8 % (ref 11.5–15.5)
WBC: 7.2 10*3/uL (ref 4.0–10.5)

## 2014-08-27 MED ORDER — LEVOFLOXACIN 750 MG PO TABS: 750.0000 mg | ORAL_TABLET | Freq: Every day | ORAL | Status: DC

## 2014-08-27 MED ORDER — ALBUTEROL SULFATE HFA 108 (90 BASE) MCG/ACT IN AERS: 2.0000 | INHALATION_SPRAY | RESPIRATORY_TRACT | Status: DC | PRN

## 2014-08-27 MED ORDER — TIOTROPIUM BROMIDE MONOHYDRATE 18 MCG IN CAPS: 18.0000 ug | ORAL_CAPSULE | Freq: Every day | RESPIRATORY_TRACT | Status: DC

## 2014-08-27 MED ORDER — PREDNISONE 10 MG PO TABS: ORAL_TABLET | ORAL | Status: DC

## 2014-08-27 NOTE — Discharge Summary (Signed)
Physician Discharge Summary  Michael Evans MVE:[id number] DOB: 1941-08-09 DOA: 08/26/2014  PCP: Odette Fraction, MD  Admit date: 08/26/2014 Discharge date: 08/27/2014  Time spent: 40 minutes  Recommendations for Outpatient Follow-up:  1. Follow up with primary care physician in 1-2 weeks  Discharge Diagnoses:  Active Problems:   Community acquired pneumonia   Hypoxia COPD exacerbation Shingles Essential hypertension OSA  Discharge Condition: improved  Diet recommendation: low salt  Filed Weights   08/26/14 1236 08/26/14 1945  Weight: 68.493 kg (151 lb) 69.4 kg (153 lb)    History of present illness:  This patient was admitted to the hospital with cough shortness of breath which had been occurring over the past 10 days. He was evaluated in the emergency room and noted to have significant dyspnea on exertion. Chest x-ray indicated possible right lower lobe infiltrate. He was started on antibiotics. Patient has a history of COPD but was not on any inhalers. With treatment, his respiratory status improved. The patient remained afebrile and had no leukocytosis. He was able to ambulate on room air to maintain saturations greater than 90%. He was given a prescription for albuterol inhaler as well as spiriva inhaler. He was also started on a short course of steroids. Patient already for discharge home. He will followup with his primary care physician.  Procedures:    Consultations:    Discharge Exam: Filed Vitals:   08/27/14 0625  BP: 156/78  Pulse: 60  Temp: 97.1 F (36.2 C)  Resp: 18    General: NAD Cardiovascular: S1, S2 RRR Respiratory: mostly clear breath sounds with mild wheezing  Discharge Instructions You were cared for by a hospitalist during your hospital stay. If you have any questions about your discharge medications or the care you received while you were in the hospital after you are discharged, you can call the unit and asked to speak with the hospitalist  on call if the hospitalist that took care of you is not available. Once you are discharged, your primary care physician will handle any further medical issues. Please note that NO REFILLS for any discharge medications will be authorized once you are discharged, as it is imperative that you return to your primary care physician (or establish a relationship with a primary care physician if you do not have one) for your aftercare needs so that they can reassess your need for medications and monitor your lab values.  Discharge Instructions   Call MD for:  difficulty breathing, headache or visual disturbances    Complete by:  As directed      Call MD for:  temperature >100.4    Complete by:  As directed      Diet - low sodium heart healthy    Complete by:  As directed      Increase activity slowly    Complete by:  As directed           Discharge Medication List as of 08/27/2014  1:51 PM    START taking these medications   Details  albuterol (PROVENTIL HFA;VENTOLIN HFA) 108 (90 BASE) MCG/ACT inhaler Inhale 2 puffs into the lungs every 4 (four) hours as needed for wheezing or shortness of breath., Starting 08/27/2014, Until Discontinued, Print    levofloxacin (LEVAQUIN) 750 MG tablet Take 1 tablet (750 mg total) by mouth daily., Starting 08/27/2014, Until Discontinued, Print    predniSONE (DELTASONE) 10 MG tablet Take 40mg  po daily for 2 days then 30mg  po daily for 2 days then 20mg  po  daily for 2 days then 10mg  po daily for 2 days then stop, Print    tiotropium (SPIRIVA HANDIHALER) 18 MCG inhalation capsule Place 1 capsule (18 mcg total) into inhaler and inhale daily., Starting 08/27/2014, Until Discontinued, Print      CONTINUE these medications which have NOT CHANGED   Details  cyanocobalamin 2000 MCG tablet Take 2,000 mcg by mouth daily., Until Discontinued, Historical Med    HYDROcodone-acetaminophen (NORCO) 5-325 MG per tablet Take 1-2 tablets by mouth every 6 (six) hours as needed for  moderate pain., Starting 08/21/2014, Until Discontinued, Print    Multiple Vitamin (MULTIVITAMIN) tablet Take 1 tablet by mouth daily., Until Discontinued, Historical Med    valACYclovir (VALTREX) 1000 MG tablet Take 1 tablet (1,000 mg total) by mouth 3 (three) times daily., Starting 08/21/2014, Until Discontinued, Normal       Allergies  Allergen Reactions  . Aspirin Other (See Comments)    Upset stomach.  . Penicillins Hives  . Sulfa Antibiotics Hives   Follow-up Information   Follow up with [facility] TOM, MD. Schedule an appointment as soon as possible for a visit in 2 weeks.   Specialty:  Family Medicine   Contact information:   [address] [place] [address] [phone]        The results of significant diagnostics from this hospitalization (including imaging, microbiology, ancillary and laboratory) are listed below for reference.    Significant Diagnostic Studies: Dg Chest 2 View  08/26/2014   CLINICAL DATA:  Cough for 2 weeks. Chest pain. History of shingles on the right anterior and posterior chest.  EXAM: CHEST  2 VIEW  COMPARISON:  06/14/2013  FINDINGS: There are significant emphysematous changes. Heart is normal in size. There is no pulmonary edema. Although markings at the right lung base are crowded common there is question of developing infiltrate in the medial right lung base. No pleural effusions. Visualized osseous structures have a normal appearance.  IMPRESSION: 1. Emphysematous changes. 2. Question of developing right lower lobe infiltrate.   Electronically Signed   By: Shon Hale M.D.   On: 08/26/2014 15:46    Microbiology: No results found for this or any previous visit (from the past 240 hour(s)).   Labs: Basic Metabolic Panel:  Recent Labs Lab 08/26/14 1315 08/27/14 0711  NA 133* 138  K 4.0 4.2  CL 94* 100  CO2 26 28  GLUCOSE 156* 133*  BUN 11 11  CREATININE 0.90 0.91  CALCIUM 9.4 9.3   Liver Function Tests: No results  found for this basename: AST, ALT, ALKPHOS, BILITOT, PROT, ALBUMIN,  in the last 168 hours No results found for this basename: LIPASE, AMYLASE,  in the last 168 hours No results found for this basename: AMMONIA,  in the last 168 hours CBC:  Recent Labs Lab 08/26/14 1315 08/27/14 0711  WBC 8.6 7.2  NEUTROABS 5.6  --   HGB 15.8 15.4  HCT 44.8 43.9  MCV 87.3 87.3  PLT 216 211   Cardiac Enzymes: No results found for this basename: CKTOTAL, CKMB, CKMBINDEX, TROPONINI,  in the last 168 hours BNP: BNP (last 3 results) No results found for this basename: PROBNP,  in the last 8760 hours CBG: No results found for this basename: GLUCAP,  in the last 168 hours     Signed:  Vihana Kydd  [facility] 08/27/2014, 6:59 PM

## 2014-08-27 NOTE — Discharge Instructions (Signed)

## 2014-08-27 NOTE — Progress Notes (Signed)
UR completed 

## 2014-08-27 NOTE — Progress Notes (Signed)
Ambulated the patient in the hall approx. 250 ft.  The patient had minimal SOB and tolerated the walk without difficulty.  His sats remained 91-94% room air.  The patient was ambulated with his N 95 mask in place.  MD was notified of the above and the MD stated the patient would be discharged shortly.  Patient discharged with instructions, prescription, and care notes.  Verbalized understanding via teach back.  IV was removed and the site was WNL. Patient voiced no further complaints or concerns at the time of discharge.  Appointments scheduled per instructions.  Patient left the floor via ambulation with staff and family in stable condition.

## 2014-08-28 ENCOUNTER — Ambulatory Visit (INDEPENDENT_AMBULATORY_CARE_PROVIDER_SITE_OTHER): Payer: Medicare Other | Admitting: Family Medicine

## 2014-08-28 ENCOUNTER — Encounter: Payer: Self-pay | Admitting: Family Medicine

## 2014-08-28 VITALS — BP 136/76 | HR 74 | Temp 98.3°F | Resp 18 | Ht 65.0 in | Wt 157.0 lb

## 2014-08-28 DIAGNOSIS — J189 Pneumonia, unspecified organism: Secondary | ICD-10-CM

## 2014-08-28 MED ORDER — METHYLPREDNISOLONE ACETATE 40 MG/ML IJ SUSP
60.0000 mg | Freq: Once | INTRAMUSCULAR | Status: AC
  Administered 2014-08-28: 60 mg via INTRAMUSCULAR

## 2014-08-28 NOTE — Progress Notes (Signed)
Subjective:    Patient ID: Michael Evans, male    DOB: [birth year], 73 y.o.   MRN: [id number]  HPI Patient was admitted to the hospital over the weekend. I have copied relevant portions of this discharge summary and include them below for my reference:  Admit date: 08/26/2014  Discharge date: 08/27/2014  Time spent: 40 minutes  Recommendations for Outpatient Follow-up:  1. Follow up with primary care physician in 1-2 weeks Discharge Diagnoses:  Active Problems:  Community acquired pneumonia  Hypoxia  COPD exacerbation  Shingles  Essential hypertension  OSA  Discharge Condition: improved  Diet recommendation: low salt  Filed Weights    08/26/14 1236  08/26/14 1945   Weight:  68.493 kg (151 lb)  69.4 kg (153 lb)   History of present illness:  This patient was admitted to the hospital with cough shortness of breath which had been occurring over the past 10 days. He was evaluated in the emergency room and noted to have significant dyspnea on exertion. Chest x-ray indicated possible right lower lobe infiltrate. He was started on antibiotics. Patient has a history of COPD but was not on any inhalers. With treatment, his respiratory status improved. The patient remained afebrile and had no leukocytosis. He was able to ambulate on room air to maintain saturations greater than 90%. He was given a prescription for albuterol inhaler as well as spiriva inhaler. He was also started on a short course of steroids. Patient already for discharge home. He will followup with his primary care physician.   Discharge Medication List as of 08/27/2014 1:51 PM     START taking these medications    Details   albuterol (PROVENTIL HFA;VENTOLIN HFA) 108 (90 BASE) MCG/ACT inhaler  Inhale 2 puffs into the lungs every 4 (four) hours as needed for wheezing or shortness of breath., Starting 08/27/2014, Until Discontinued, Print     levofloxacin (LEVAQUIN) 750 MG tablet  Take 1 tablet (750 mg total) by mouth daily.,  Starting 08/27/2014, Until Discontinued, Print     predniSONE (DELTASONE) 10 MG tablet  Take 40mg  po daily for 2 days then 30mg  po daily for 2 days then 20mg  po daily for 2 days then 10mg  po daily for 2 days then stop, Print     tiotropium (SPIRIVA HANDIHALER) 18 MCG inhalation capsule  Place 1 capsule (18 mcg total) into inhaler and inhale daily., Starting 08/27/2014, Until Discontinued, Print       CONTINUE these medications which have NOT CHANGED    Details   cyanocobalamin 2000 MCG tablet  Take 2,000 mcg by mouth daily., Until Discontinued, Historical Med     HYDROcodone-acetaminophen (NORCO) 5-325 MG per tablet  Take 1-2 tablets by mouth every 6 (six) hours as needed for moderate pain., Starting 08/21/2014, Until Discontinued, Print     Multiple Vitamin (MULTIVITAMIN) tablet  Take 1 tablet by mouth daily., Until Discontinued, Historical Med     valACYclovir (VALTREX) 1000 MG tablet  Take 1 tablet (1,000 mg total) by mouth 3 (three) times daily., Starting 08/21/2014, Until Discontinued, Normal      Significant Diagnostic Studies:  Dg Chest 2 View  08/26/2014 CLINICAL DATA: Cough for 2 weeks. Chest pain. History of shingles on the right anterior and posterior chest. EXAM: CHEST 2 VIEW COMPARISON: 06/14/2013 FINDINGS: There are significant emphysematous changes. Heart is normal in size. There is no pulmonary edema. Although markings at the right lung base are crowded common there is question of developing infiltrate in the medial right  lung base. No pleural effusions. Visualized osseous structures have a normal appearance. IMPRESSION: 1. Emphysematous changes. 2. Question of developing right lower lobe infiltrate. Electronically Signed By: Shon Hale M.D. On: 08/26/2014 15:46    patient states that he developed a cough productive of thick sputum on Friday and worsening shortness of breath. He went to the emergency room where chest x-ray showed possible right lower lobe pneumonia. He was treated  with Levaquin 750 mg daily for 7 days. He was also discharged on a steroid taper. Using using the steroids as well as albuterol every 4 hours as needed. He continues to have a cough productive of mucus. He continues to have substantial shortness of breath and dyspnea on exertion. He denies any fevers or chills. He denies hemoptysis. On examination today he has decreased breath sounds bilaterally with poor air movement.  There are also thick rales in the upper airways. Past Medical History  Diagnosis Date  . COPD (chronic obstructive pulmonary disease)     PFT 06-26-09 FEV1  1.75( 71%), FVC 3.65( 101%), FEV1% 48, TLC 5.53(104%), DLCO 55%, no BD  . Arthritis   . Skin cancer   . Hypertension   . Nasal septal deviation   . Chronic rhinitis   . OSA (obstructive sleep apnea) 04/21/2011  . Nocturia    Past Surgical History  Procedure Laterality Date  . Back surgery    . Appendectomy    . Cataract extraction w/phaco  01/06/2013    Procedure: CATARACT EXTRACTION PHACO AND INTRAOCULAR LENS PLACEMENT (IOC);  Surgeon: Tonny Branch, MD;  Location: [facility];  Service: Ophthalmology;  Laterality: Right;  CDE: 22.17   Current Outpatient Prescriptions on File Prior to Visit  Medication Sig Dispense Refill  . albuterol (PROVENTIL HFA;VENTOLIN HFA) 108 (90 BASE) MCG/ACT inhaler Inhale 2 puffs into the lungs every 4 (four) hours as needed for wheezing or shortness of breath.  1 Inhaler  2  . cyanocobalamin 2000 MCG tablet Take 2,000 mcg by mouth daily.      Marland Kitchen HYDROcodone-acetaminophen (NORCO) 5-325 MG per tablet Take 1-2 tablets by mouth every 6 (six) hours as needed for moderate pain.  30 tablet  0  . Multiple Vitamin (MULTIVITAMIN) tablet Take 1 tablet by mouth daily.      . valACYclovir (VALTREX) 1000 MG tablet Take 1 tablet (1,000 mg total) by mouth 3 (three) times daily.  21 tablet  0  . tiotropium (SPIRIVA HANDIHALER) 18 MCG inhalation capsule Place 1 capsule (18 mcg total) into inhaler and inhale daily.  30  capsule  12   No current facility-administered medications on file prior to visit.   Allergies  Allergen Reactions  . Aspirin Other (See Comments)    Upset stomach.  . Penicillins Hives  . Sulfa Antibiotics Hives   History   Social History  . Marital Status: Divorced    Spouse Name: N/A    Number of Children: N/A  . 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 Smoking status: Former Smoker -- 1.50 packs/day for 54 years    Types: Cigarettes    Quit date: 12/01/2001  . Smokeless tobacco: Never Used  . Alcohol Use: No  . Drug Use: No  . Sexual Activity: Not on file   Other Topics Concern  . Not on file   Social History Narrative  . No narrative on file       Review of Systems  All  other systems reviewed and are negative.      Objective:   Physical Exam  Vitals reviewed. Constitutional: He appears well-developed and well-nourished.  HENT:  Right Ear: External ear normal.  Left Ear: External ear normal.  Nose: Nose normal.  Mouth/Throat: Oropharynx is clear and moist. No oropharyngeal exudate.  Neck: Neck supple.  Cardiovascular: Normal rate, regular rhythm and normal heart sounds.   Pulmonary/Chest: Effort normal. No respiratory distress. He has decreased breath sounds. He has no wheezes. He has rales. He exhibits no tenderness.  Abdominal: Soft. Bowel sounds are normal.  Musculoskeletal: He exhibits no edema.  Lymphadenopathy:    He has no cervical adenopathy.          Assessment & Plan:  CAP (community acquired pneumonia)  Patient does have community-acquired pneumonia. I like him to continue the Levaquin. However his symptoms now more concerning for a COPD exacerbation. Therefore the patient received 60 mg of Depo-Medrol today in clinic to try to help decrease some of the bronchospasm and improve his airflow.  I like to recheck him in 24 hours. Continue Levaquin 750 mg by mouth daily. Add Mucinex  200 mg every 4 hours. Continue use albuterol 2 puffs inhaled every 4 hours. Continue augmenting with high-dose steroids until his COPD begins to improve.

## 2014-09-04 ENCOUNTER — Encounter: Payer: Self-pay | Admitting: Family Medicine

## 2014-09-04 ENCOUNTER — Ambulatory Visit (INDEPENDENT_AMBULATORY_CARE_PROVIDER_SITE_OTHER): Payer: Medicare Other | Admitting: Family Medicine

## 2014-09-04 VITALS — BP 150/88 | HR 74 | Temp 98.1°F | Resp 16 | Ht 65.0 in | Wt 158.0 lb

## 2014-09-04 DIAGNOSIS — B0229 Other postherpetic nervous system involvement: Secondary | ICD-10-CM | POA: Diagnosis not present

## 2014-09-04 MED ORDER — GABAPENTIN 300 MG PO CAPS: 300.0000 mg | ORAL_CAPSULE | Freq: Three times a day (TID) | ORAL | Status: DC

## 2014-09-04 MED ORDER — PREGABALIN 100 MG PO CAPS: ORAL_CAPSULE | ORAL | Status: DC

## 2014-09-04 NOTE — Progress Notes (Signed)
Subjective:    Patient ID: Michael Evans, male    DOB: [birth year], 73 y.o.   MRN: [id number]  HPI 08/21/14 Friday, the patient developed a rash on the right side of his chest underneath his breast that starts in the midline of his chest and radiates to his back in a dermatomal pattern. The rash consists of erythematous papules and vesicles that have coalesced into plaques and patches.  At that time, the plan was:  Patient has shingles. Begin Valtrex 1 g by mouth 3 times a day for 7 days. Gave patient prescription for Norco 5/325 mg one to 2 tablets every 6 hours as needed for pain.  09/04/14 Patient continues to have severe pain in the right side of his chest in the area where he has his shingles rash. The pain is deep and burning and is unrelieved by hydrocodone. He is unable to sleep. Past Medical History  Diagnosis Date  . COPD (chronic obstructive pulmonary disease)     PFT 06-26-09 FEV1  1.75( 71%), FVC 3.65( 101%), FEV1% 48, TLC 5.53(104%), DLCO 55%, no BD  . Arthritis   . Skin cancer   . Hypertension   . Nasal septal deviation   . Chronic rhinitis   . OSA (obstructive sleep apnea) 04/21/2011  . Nocturia    Current Outpatient Prescriptions on File Prior to Visit  Medication Sig Dispense Refill  . albuterol (PROVENTIL HFA;VENTOLIN HFA) 108 (90 BASE) MCG/ACT inhaler Inhale 2 puffs into the lungs every 4 (four) hours as needed for wheezing or shortness of breath.  1 Inhaler  2  . cyanocobalamin 2000 MCG tablet Take 2,000 mcg by mouth daily.      Marland Kitchen HYDROcodone-acetaminophen (NORCO) 5-325 MG per tablet Take 1-2 tablets by mouth every 6 (six) hours as needed for moderate pain.  30 tablet  0  . Multiple Vitamin (MULTIVITAMIN) tablet Take 1 tablet by mouth daily.      Marland Kitchen tiotropium (SPIRIVA HANDIHALER) 18 MCG inhalation capsule Place 1 capsule (18 mcg total) into inhaler and inhale daily.  30 capsule  12   No current facility-administered medications on file prior to visit.   Allergies    Allergen Reactions  . Aspirin Other (See Comments)    Upset stomach.  . Penicillins Hives  . Sulfa Antibiotics Hives   History   Social History  . Marital Status: Divorced    Spouse Name: N/A    Number of Children: N/A  . 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 Smoking status: Former Smoker -- 1.50 packs/day for 54 years    Types: Cigarettes    Quit date: 12/01/2001  . Smokeless tobacco: Never Used  . Alcohol Use: No  . Drug Use: No  . Sexual Activity: Not on file   Other Topics Concern  . Not on file   Social History Narrative  . No narrative on file      Review of Systems  All other systems reviewed and are negative.      Objective:   Physical Exam  Vitals reviewed. Cardiovascular: Normal rate and regular rhythm.   Pulmonary/Chest: Effort normal and breath sounds normal.  Skin: Rash noted. There is erythema.          Assessment & Plan:  Post herpetic neuralgia - Plan: pregabalin (LYRICA) 100 MG capsule lyrica 100 mg potid prn pain.  Gave the patient prescription for Lyrica. I told him that  if the prescription was too expensive he could get a prescription for gabapentin 300 mg by mouth 3 times a day instead.

## 2014-09-26 DIAGNOSIS — M720 Palmar fascial fibromatosis [Dupuytren]: Secondary | ICD-10-CM | POA: Diagnosis not present

## 2014-10-02 ENCOUNTER — Ambulatory Visit (INDEPENDENT_AMBULATORY_CARE_PROVIDER_SITE_OTHER): Payer: Medicare Other | Admitting: Family Medicine

## 2014-10-02 ENCOUNTER — Ambulatory Visit: Payer: Medicare Other | Admitting: Family Medicine

## 2014-10-02 ENCOUNTER — Encounter: Payer: Self-pay | Admitting: Family Medicine

## 2014-10-02 VITALS — BP 172/94 | HR 68 | Temp 97.6°F | Resp 18 | Wt 154.0 lb

## 2014-10-02 DIAGNOSIS — J441 Chronic obstructive pulmonary disease with (acute) exacerbation: Secondary | ICD-10-CM | POA: Diagnosis not present

## 2014-10-02 DIAGNOSIS — B0229 Other postherpetic nervous system involvement: Secondary | ICD-10-CM

## 2014-10-02 NOTE — Progress Notes (Signed)
Subjective:    Patient ID: Michael Evans., male    DOB: [birth year], 73 y.o.   MRN: [id number]  HPI  Patient has postherpetic neuralgia.  He has been suffering from shingles since early September. 2 weeks ago, the rash returned to his upper right chest. Is a linear, dermatomal rash consistent of erythematous papules and vesicles. It is extremely painful. He received benefit from lyrica 100 mg by mouth 3 times a day. He is out of the medication. He also reports worsening cough productive of green sputum and increasing shortness of breath for the last week. He denies any fever. His blood pressure is elevated today due to his severe pain from the shingles. Past Medical History  Diagnosis Date  . COPD (chronic obstructive pulmonary disease)     PFT 06-26-09 FEV1  1.75( 71%), FVC 3.65( 101%), FEV1% 48, TLC 5.53(104%), DLCO 55%, no BD  . Arthritis   . Skin cancer   . Hypertension   . Nasal septal deviation   . Chronic rhinitis   . OSA (obstructive sleep apnea) 04/21/2011  . Nocturia    Current Outpatient Prescriptions on File Prior to Visit  Medication Sig Dispense Refill  . albuterol (PROVENTIL HFA;VENTOLIN HFA) 108 (90 BASE) MCG/ACT inhaler Inhale 2 puffs into the lungs every 4 (four) hours as needed for wheezing or shortness of breath. 1 Inhaler 2  . cyanocobalamin 2000 MCG tablet Take 2,000 mcg by mouth daily.    Marland Kitchen gabapentin (NEURONTIN) 300 MG capsule Take 1 capsule (300 mg total) by mouth 3 (three) times daily. 90 capsule 3  . HYDROcodone-acetaminophen (NORCO) 5-325 MG per tablet Take 1-2 tablets by mouth every 6 (six) hours as needed for moderate pain. 30 tablet 0  . Multiple Vitamin (MULTIVITAMIN) tablet Take 1 tablet by mouth daily.    . pregabalin (LYRICA) 100 MG capsule Take 3 times a day for shingles pain 90 capsule 0  . tiotropium (SPIRIVA HANDIHALER) 18 MCG inhalation capsule Place 1 capsule (18 mcg total) into inhaler and inhale daily. 30 capsule 12   No current  facility-administered medications on file prior to visit.   Allergies  Allergen Reactions  . Aspirin Other (See Comments)    Upset stomach.  . Penicillins Hives  . Sulfa Antibiotics Hives   History   Social History  . Marital Status: Divorced    Spouse Name: N/A    Number of Children: N/A  . 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 Smoking status: Former Smoker -- 1.50 packs/day for 54 years    Types: Cigarettes    Quit date: 12/01/2001  . Smokeless tobacco: Never Used  . Alcohol Use: No  . Drug Use: No  . Sexual Activity: Not on file   Other Topics Concern  . Not on file   Social History Narrative     Review of Systems  All other systems reviewed and are negative.      Objective:   Physical Exam  Cardiovascular: Normal rate, regular rhythm and normal heart sounds.   Pulmonary/Chest: Effort normal. He has wheezes.  Skin: Rash noted. There is erythema.  Vitals reviewed.         Assessment & Plan:  Post herpetic neuralgia  COPD exacerbation  I will start the patient on prednisone 40 mg by mouth daily for 7 days. I will also start the patient on Levaquin 500 mg by mouth daily for 7  days. I will treat his postherpetic neuralgia with Lyrica 150 mg by mouth 3 times a day. Patient appears to have a second outbreak of shingles. Therefore I will start the patient on Valtrex 1 g by mouth 3 times a day for 7 days

## 2015-02-11 ENCOUNTER — Emergency Department (HOSPITAL_COMMUNITY): Payer: Medicare Other

## 2015-02-11 ENCOUNTER — Encounter (HOSPITAL_COMMUNITY): Payer: Self-pay | Admitting: Emergency Medicine

## 2015-02-11 ENCOUNTER — Emergency Department (HOSPITAL_COMMUNITY)
Admission: EM | Admit: 2015-02-11 | Discharge: 2015-02-11 | Disposition: A | Discharge status: Home / Self Care | Payer: Medicare Other | Attending: Emergency Medicine | Admitting: Emergency Medicine

## 2015-02-11 DIAGNOSIS — Z87891 Personal history of nicotine dependence: Secondary | ICD-10-CM | POA: Insufficient documentation

## 2015-02-11 DIAGNOSIS — R9431 Abnormal electrocardiogram [ECG] [EKG]: Secondary | ICD-10-CM | POA: Diagnosis not present

## 2015-02-11 DIAGNOSIS — Z8669 Personal history of other diseases of the nervous system and sense organs: Secondary | ICD-10-CM | POA: Insufficient documentation

## 2015-02-11 DIAGNOSIS — Z85828 Personal history of other malignant neoplasm of skin: Secondary | ICD-10-CM | POA: Diagnosis not present

## 2015-02-11 DIAGNOSIS — Z880 Allergy status to penicillin: Secondary | ICD-10-CM | POA: Diagnosis not present

## 2015-02-11 DIAGNOSIS — I10 Essential (primary) hypertension: Secondary | ICD-10-CM | POA: Insufficient documentation

## 2015-02-11 DIAGNOSIS — J441 Chronic obstructive pulmonary disease with (acute) exacerbation: Secondary | ICD-10-CM | POA: Diagnosis not present

## 2015-02-11 DIAGNOSIS — Z79899 Other long term (current) drug therapy: Secondary | ICD-10-CM | POA: Diagnosis not present

## 2015-02-11 DIAGNOSIS — R05 Cough: Secondary | ICD-10-CM | POA: Diagnosis not present

## 2015-02-11 DIAGNOSIS — Z8739 Personal history of other diseases of the musculoskeletal system and connective tissue: Secondary | ICD-10-CM | POA: Diagnosis not present

## 2015-02-11 DIAGNOSIS — R0602 Shortness of breath: Secondary | ICD-10-CM | POA: Diagnosis not present

## 2015-02-11 LAB — BASIC METABOLIC PANEL
Anion gap: 8 (ref 5–15)
BUN: 12 mg/dL (ref 6–23)
CALCIUM: 9.3 mg/dL (ref 8.4–10.5)
CO2: 24 mmol/L (ref 19–32)
Chloride: 106 mmol/L (ref 96–112)
Creatinine, Ser: 0.95 mg/dL (ref 0.50–1.35)
GFR calc Af Amer: >90 mL/min (ref >90)
GFR calc non Af Amer: 79 mL/min — ABNORMAL LOW (ref >90)
GLUCOSE: 107 mg/dL — AB (ref 70–99)
POTASSIUM: 4 mmol/L (ref 3.5–5.1)
SODIUM: 138 mmol/L (ref 135–145)

## 2015-02-11 LAB — CBC WITH DIFFERENTIAL/PLATELET
BASOS ABS: 0 10*3/uL (ref 0.0–0.1)
Basophils Relative: 0 % (ref 0–1)
EOS PCT: 1 % (ref 0–5)
Eosinophils Absolute: 0 10*3/uL (ref 0.0–0.7)
HEMATOCRIT: 46.1 % (ref 39.0–52.0)
Hemoglobin: 15.8 g/dL (ref 13.0–17.0)
Lymphocytes Relative: 7 % — ABNORMAL LOW (ref 12–46)
Lymphs Abs: 0.6 10*3/uL — ABNORMAL LOW (ref 0.7–4.0)
MCH: 30.3 pg (ref 26.0–34.0)
MCHC: 34.3 g/dL (ref 30.0–36.0)
MCV: 88.5 fL (ref 78.0–100.0)
MONO ABS: 1.2 10*3/uL — AB (ref 0.1–1.0)
MONOS PCT: 14 % — AB (ref 3–12)
Neutro Abs: 6.8 10*3/uL (ref 1.7–7.7)
Neutrophils Relative %: 78 % — ABNORMAL HIGH (ref 43–77)
PLATELETS: 198 10*3/uL (ref 150–400)
RBC: 5.21 MIL/uL (ref 4.22–5.81)
RDW: 12.9 % (ref 11.5–15.5)
WBC: 8.6 10*3/uL (ref 4.0–10.5)

## 2015-02-11 LAB — TROPONIN I: Troponin I: <0.03 ng/mL (ref <0.031)

## 2015-02-11 MED ORDER — SODIUM CHLORIDE 0.9 % IV BOLUS (SEPSIS)
500.0000 mL | Freq: Once | INTRAVENOUS | Status: AC
  Administered 2015-02-11: 1000 mL via INTRAVENOUS

## 2015-02-11 MED ORDER — LEVOFLOXACIN 500 MG PO TABS
500.0000 mg | ORAL_TABLET | Freq: Once | ORAL | Status: AC
  Administered 2015-02-11: 500 mg via ORAL
  Filled 2015-02-11: qty 1

## 2015-02-11 MED ORDER — ALBUTEROL SULFATE (2.5 MG/3ML) 0.083% IN NEBU: 2.5000 mg | INHALATION_SOLUTION | RESPIRATORY_TRACT | Status: DC | PRN

## 2015-02-11 MED ORDER — IPRATROPIUM-ALBUTEROL 0.5-2.5 (3) MG/3ML IN SOLN
3.0000 mL | Freq: Once | RESPIRATORY_TRACT | Status: AC
  Administered 2015-02-11: 3 mL via RESPIRATORY_TRACT
  Filled 2015-02-11: qty 3

## 2015-02-11 MED ORDER — LEVOFLOXACIN 500 MG PO TABS: 500.0000 mg | ORAL_TABLET | Freq: Every day | ORAL | Status: DC

## 2015-02-11 MED ORDER — PREDNISONE 50 MG PO TABS: ORAL_TABLET | ORAL | Status: DC

## 2015-02-11 MED ORDER — METHYLPREDNISOLONE SODIUM SUCC 125 MG IJ SOLR
125.0000 mg | Freq: Once | INTRAMUSCULAR | Status: AC
  Administered 2015-02-11: 125 mg via INTRAVENOUS
  Filled 2015-02-11: qty 2

## 2015-02-11 NOTE — ED Provider Notes (Addendum)
CSN: [id number]     Arrival date & time 02/11/15  1159 History   First MD Initiated Contact with Patient 02/11/15 1245     Chief Complaint  Patient presents with  . Shortness of Breath     (Consider location/radiation/quality/duration/timing/severity/associated sxs/prior Treatment) HPI.... Coughing and wheezing since yesterday. Chest hurts with deep cough. Patient has history of COPD. Severity is moderate. Nothing makes symptoms better or worse. No fever sweats or chills.  Past Medical History  Diagnosis Date  . COPD (chronic obstructive pulmonary disease)     PFT 06-26-09 FEV1  1.75( 71%), FVC 3.65( 101%), FEV1% 48, TLC 5.53(104%), DLCO 55%, no BD  . Arthritis   . Skin cancer   . Hypertension   . Nasal septal deviation   . Chronic rhinitis   . OSA (obstructive sleep apnea) 04/21/2011  . Nocturia    Past Surgical History  Procedure Laterality Date  . Back surgery    . Appendectomy    . Cataract extraction w/phaco  01/06/2013    Procedure: CATARACT EXTRACTION PHACO AND INTRAOCULAR LENS PLACEMENT (IOC);  Surgeon: Tonny Branch, MD;  Location: [facility];  Service: Ophthalmology;  Laterality: Right;  CDE: 22.17   Family History  Problem Relation Age of Onset  . Lung cancer Mother    History  Substance Use Topics  . Smoking status: Former Smoker -- 1.50 packs/day for 54 years    Types: Cigarettes    Quit date: 12/01/2001  . Smokeless tobacco: Never Used  . Alcohol Use: No    Review of Systems  All other systems reviewed and are negative.     Allergies  Aspirin; Penicillins; and Sulfa antibiotics  Home Medications   Prior to Admission medications   Medication Sig Start Date End Date Taking? Authorizing Provider  cyanocobalamin 2000 MCG tablet Take 2,000 mcg by mouth daily.   Yes Historical Provider, MD  Multiple Vitamin (MULTIVITAMIN) tablet Take 1 tablet by mouth daily.   Yes Historical Provider, MD  albuterol (PROVENTIL) (2.5 MG/3ML) 0.083% nebulizer solution Take 3 mLs  (2.5 mg total) by nebulization every 4 (four) hours as needed for wheezing or shortness of breath. 02/11/15   Nat Christen, MD  levofloxacin (LEVAQUIN) 500 MG tablet Take 1 tablet (500 mg total) by mouth daily. 02/11/15   Nat Christen, MD  predniSONE (DELTASONE) 50 MG tablet 1 tablet daily for 6 days, one half tablet daily for 6 days 02/11/15   Nat Christen, MD  [facility] HFA 108 (90 BASE) MCG/ACT inhaler  11/16/14   Historical Provider, MD  Tiotropium Bromide Monohydrate (SPIRIVA RESPIMAT) 2.5 MCG/ACT AERS Take 2 puffs once a day for COPD 02/14/15   Alycia Rossetti, MD   BP 141/78 mmHg  Pulse 88  Temp(Src) 98.9 F (37.2 C) (Oral)  Resp 16  Ht 5\' 4"  (1.626 m)  Wt 140 lb (63.504 kg)  BMI 24.02 kg/m2  SpO2 95% Physical Exam  Constitutional: He is oriented to person, place, and time. He appears well-developed and well-nourished.  HENT:  Head: Normocephalic and atraumatic.  Eyes: Conjunctivae and EOM are normal. Pupils are equal, round, and reactive to light.  Neck: Normal range of motion. Neck supple.  Cardiovascular: Normal rate and regular rhythm.   Pulmonary/Chest: Effort normal.  Bilateral expiratory wheeze  Abdominal: Soft. Bowel sounds are normal.  Musculoskeletal: Normal range of motion.  Neurological: He is alert and oriented to person, place, and time.  Skin: Skin is warm and dry.  Psychiatric: He has a normal mood and  affect. His behavior is normal.  Nursing note and vitals reviewed.   ED Course  Procedures (including critical care time) Labs Review Labs Reviewed  CBC WITH DIFFERENTIAL/PLATELET - Abnormal; Notable for the following:    Neutrophils Relative % 78 (*)    Lymphocytes Relative 7 (*)    Lymphs Abs 0.6 (*)    Monocytes Relative 14 (*)    Monocytes Absolute 1.2 (*)    All other components within normal limits  BASIC METABOLIC PANEL - Abnormal; Notable for the following:    Glucose, Bld 107 (*)    GFR calc non Af Amer 79 (*)    All other components within normal  limits  TROPONIN I    Imaging Review No results found.   EKG Interpretation   Date/Time:  Sunday February 11 2015 12:16:48 EDT Ventricular Rate:  95 PR Interval:  128 QRS Duration: 80 QT Interval:  308 QTC Calculation: 387 R Axis:   -71 Text Interpretation:  Normal sinus rhythm Left axis deviation Pulmonary  disease pattern Abnormal ECG Confirmed by Nolin Grell  MD, Garrette Caine ([id number]) on  02/11/2015 12:42:54 PM      MDM   Final diagnoses:  COPD with acute exacerbation    Patient feels better after IV fluids, IV steroids, albuterol/Atrovent nebulizer treatment. Discharge medications Levaquin 500 mg, prednisone, albuterol nebulizing solution.    Nat Christen, MD 02/15/15 1548  Nat Christen, MD 02/15/15 [phone]

## 2015-02-11 NOTE — ED Notes (Signed)
Patient is resting comfortably. 

## 2015-02-11 NOTE — ED Notes (Signed)
Pt reports onset of SOB yesterday. Pt reports CP x 1 week.

## 2015-02-11 NOTE — Discharge Instructions (Signed)
Chest x-ray showed no pneumonia. Prescriptions for antibiotic and prednisone which you will start tomorrow. Also prescription for a home nebulizer machine and the medication to use in the machine. Follow-up your primary care doctor. Return if worse.

## 2015-02-11 NOTE — ED Notes (Signed)
O2 applied via [place] at 2 L/M due to RA sats at 91%, sats increased to 93% with O2 on

## 2015-02-14 ENCOUNTER — Ambulatory Visit (INDEPENDENT_AMBULATORY_CARE_PROVIDER_SITE_OTHER): Payer: Medicare Other | Admitting: Family Medicine

## 2015-02-14 ENCOUNTER — Encounter: Payer: Self-pay | Admitting: Family Medicine

## 2015-02-14 VITALS — BP 150/70 | HR 88 | Temp 97.9°F | Resp 22 | Ht 65.0 in | Wt 156.0 lb

## 2015-02-14 DIAGNOSIS — J441 Chronic obstructive pulmonary disease with (acute) exacerbation: Secondary | ICD-10-CM | POA: Diagnosis not present

## 2015-02-14 DIAGNOSIS — I10 Essential (primary) hypertension: Secondary | ICD-10-CM | POA: Diagnosis not present

## 2015-02-14 DIAGNOSIS — J439 Emphysema, unspecified: Secondary | ICD-10-CM | POA: Diagnosis not present

## 2015-02-14 MED ORDER — TIOTROPIUM BROMIDE MONOHYDRATE 2.5 MCG/ACT IN AERS: INHALATION_SPRAY | RESPIRATORY_TRACT | Status: DC

## 2015-02-14 NOTE — Patient Instructions (Signed)
Take the antibiotics and prednisone Try the the new spiriva  Use the nebulizer every hours while sleeping Robitussin for cough F/U Dr. Dennard Schaumann as needed

## 2015-02-14 NOTE — Progress Notes (Signed)
Patient ID: Michael Evans, male   DOB: 12-31-1940, 74 y.o.   MRN: [id number]   Subjective:    Patient ID: Michael Evans, male    DOB: 1941-09-10, 74 y.o.   MRN: [id number]  Patient presents for COPD  patient here to follow-up ER visit 3/38for COPD exacerbation. Of note he has not been taking his Spiriva because he states that the capsule causes him have a sore throat. He used to be followed by pulmonary the past was tried on some other types of inhalers was seen to have some reactions to these as well. He is doing much better since the ER visit he was given prednisone shot and he is now on prednisone tablets he is also on antibiotics and he was given albuterol and a nebulizer but he did not know how to work it therefore he has not used this just yet. CXR- no infiltrate   Review Of Systems:  GEN- denies fatigue, fever, weight loss,weakness, recent illness HEENT- denies eye drainage, change in vision, nasal discharge, CVS- denies chest pain, palpitations RESP-+SOB, +cough, +wheeze ABD- denies N/V, change in stools, abd pain MSK- denies joint pain, muscle aches, injury Neuro- denies headache, dizziness, syncope, seizure activity       Objective:    BP 150/70 mmHg  Pulse 88  Temp(Src) 97.9 F (36.6 C) (Oral)  Resp 22  Ht 5\' 5"  (1.651 m)  Wt 156 lb (70.761 kg)  BMI 25.96 kg/m2  SpO2 95% GEN- NAD, alert and oriented x3 HEENT- PERRL, EOMI, non injected sclera, pink conjunctiva, MMM, oropharynx clear Neck- Supple,  CVS- RRR, no murmur RESP-bilat wheeze and rhonchi, fair air movement, speaking comfortably, harsh cough, no retractions EXT- No edema Pulses- Radial,2+        Assessment & Plan:      Problem List Items Addressed This Visit      Unprioritized   COPD with emphysema - Primary   Relevant Medications   PROAIR HFA 108 (90 BASE) MCG/ACT inhaler   Tiotropium Bromide Monohydrate (SPIRIVA RESPIMAT) 2.5 MCG/ACT AERS      Note: This dictation was prepared with Dragon  dictation along with smaller phrase technology. Any transcriptional errors that result from this process are unintentional.

## 2015-02-14 NOTE — Assessment & Plan Note (Signed)
Patient here to follow-up ER with COPD exacerbation his breathing has improved a think he will be further along if he did have a nebulizer in use. We have discussed with him how to use the nebulizer and a medication. I've also given him the new Spiriva rest but not to try since the capsule and the powder seems to here take his throat. He will complete the prednisone as well as his antibiotics. His oxygen saturation today looked okay. He will follow-up in a couple weeks in the office to see how he is doing with the new Spiriva

## 2015-02-14 NOTE — Assessment & Plan Note (Signed)
He has history of hypertension but no longer takes any medication and declines doing so. His blood pressure is a little elevated today however in the setting of his illness I think this can be rechecked in a couple weeks with his primary care provider

## 2015-02-27 ENCOUNTER — Encounter: Payer: Self-pay | Admitting: Family Medicine

## 2015-02-27 ENCOUNTER — Ambulatory Visit (INDEPENDENT_AMBULATORY_CARE_PROVIDER_SITE_OTHER): Payer: Medicare Other | Admitting: Family Medicine

## 2015-02-27 VITALS — BP 134/84 | HR 92 | Temp 98.5°F | Resp 20 | Wt 155.0 lb

## 2015-02-27 DIAGNOSIS — G500 Trigeminal neuralgia: Secondary | ICD-10-CM | POA: Diagnosis not present

## 2015-02-27 DIAGNOSIS — J441 Chronic obstructive pulmonary disease with (acute) exacerbation: Secondary | ICD-10-CM | POA: Diagnosis not present

## 2015-02-27 MED ORDER — DOXYCYCLINE HYCLATE 100 MG PO TABS: 100.0000 mg | ORAL_TABLET | Freq: Two times a day (BID) | ORAL | Status: DC

## 2015-02-27 MED ORDER — PREDNISONE 20 MG PO TABS: 40.0000 mg | ORAL_TABLET | Freq: Every day | ORAL | Status: DC

## 2015-02-27 NOTE — Progress Notes (Signed)
Subjective:    Patient ID: Michael Evans, male    DOB: 1941/11/29, 74 y.o.   MRN: [id number]  HPI  Patient was seen in the emergency room and was diagnosed as COPD exacerbation. He was given Levaquin and prednisone. His cough improved dramatically on the prednisone and Levaquin but never completely subsided. Shortly after discontinuing the prednisone and the Levaquin, the patient's cough returned. He now reports dyspnea on exertion. He reports a cough productive of yellow and green sputum. He reports shortness of breath and pleurisy. He also complains of a one-week history of pain on the left side of his face. The pain originates just anterior to the left ear and radiates up his left temple and above his left eye in the pattern of the first branch of the trigeminal nerve. The pain is sharp and intense and constant. He denies any headache. He denies any blurry vision. There is no evidence of shingles or rash in that area. Past Medical History  Diagnosis Date  . COPD (chronic obstructive pulmonary disease)     PFT 06-26-09 FEV1  1.75( 71%), FVC 3.65( 101%), FEV1% 48, TLC 5.53(104%), DLCO 55%, no BD  . Arthritis   . Skin cancer   . Hypertension   . Nasal septal deviation   . Chronic rhinitis   . OSA (obstructive sleep apnea) 04/21/2011  . Nocturia    Past Surgical History  Procedure Laterality Date  . Back surgery    . Appendectomy    . Cataract extraction w/phaco  01/06/2013    Procedure: CATARACT EXTRACTION PHACO AND INTRAOCULAR LENS PLACEMENT (IOC);  Surgeon: Tonny Branch, MD;  Location: [facility];  Service: Ophthalmology;  Laterality: Right;  CDE: 22.17   Current Outpatient Prescriptions on File Prior to Visit  Medication Sig Dispense Refill  . albuterol (PROVENTIL) (2.5 MG/3ML) 0.083% nebulizer solution Take 3 mLs (2.5 mg total) by nebulization every 4 (four) hours as needed for wheezing or shortness of breath. 30 vial 1  . cyanocobalamin 2000 MCG tablet Take 2,000 mcg by mouth daily.    .  Multiple Vitamin (MULTIVITAMIN) tablet Take 1 tablet by mouth daily.    Marland Kitchen PROAIR HFA 108 (90 BASE) MCG/ACT inhaler   2  . Tiotropium Bromide Monohydrate (SPIRIVA RESPIMAT) 2.5 MCG/ACT AERS Take 2 puffs once a day for COPD 1 Inhaler 6  . levofloxacin (LEVAQUIN) 500 MG tablet Take 1 tablet (500 mg total) by mouth daily. (Patient not taking: Reported on 02/27/2015) 10 tablet 0  . predniSONE (DELTASONE) 50 MG tablet 1 tablet daily for 6 days, one half tablet daily for 6 days (Patient not taking: Reported on 02/27/2015) 9 tablet 0   No current facility-administered medications on file prior to visit.   Allergies  Allergen Reactions  . Aspirin Other (See Comments)    Upset stomach.  . Penicillins Hives  . Sulfa Antibiotics Hives   History   Social History  . Marital Status: Divorced    Spouse Name: N/A  . Number of Children: N/A  . 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 Smoking status: Former Smoker -- 1.50 packs/day for 54 years    Types: Cigarettes    Quit date: 12/01/2001  . Smokeless tobacco: Never Used  . Alcohol Use: No  . Drug Use: No  . Sexual Activity: Not on file   Other Topics Concern  . Not on file   Social History Narrative  Review of Systems  All other systems reviewed and are negative.      Objective:   Physical Exam  Constitutional: He is oriented to person, place, and time. He appears well-developed and well-nourished.  HENT:  Right Ear: External ear normal.  Left Ear: External ear normal.  Nose: Nose normal.  Mouth/Throat: Oropharynx is clear and moist. No oropharyngeal exudate.  Eyes: Conjunctivae are normal.  Neck: Neck supple.  Pulmonary/Chest: He has decreased breath sounds. He has wheezes. He has rhonchi.  Lymphadenopathy:    He has no cervical adenopathy.  Neurological: He is alert and oriented to person, place, and time. He has normal reflexes. He displays normal reflexes. No  cranial nerve deficit. He exhibits normal muscle tone. Coordination normal.          Assessment & Plan:  COPD exacerbation - Plan: predniSONE (DELTASONE) 20 MG tablet, doxycycline (VIBRA-TABS) 100 MG tablet  Trigeminal neuralgia  I think the patient has a continuation of his COPD exacerbation. Start doxycycline 100 mg by mouth twice a day for 10 days. Resume prednisone 40 mg by mouth daily for 7 days. Discontinue Spiriva and begin stiolto respiumat 2 inhalations once daily. Recheck in one week or sooner if worse.  Consider Tegretol if pain persist

## 2015-03-14 ENCOUNTER — Ambulatory Visit (INDEPENDENT_AMBULATORY_CARE_PROVIDER_SITE_OTHER): Payer: Medicare Other | Admitting: Family Medicine

## 2015-03-14 ENCOUNTER — Emergency Department (HOSPITAL_COMMUNITY): Payer: Medicare Other

## 2015-03-14 ENCOUNTER — Encounter (HOSPITAL_COMMUNITY): Payer: Self-pay | Admitting: *Deleted

## 2015-03-14 ENCOUNTER — Encounter: Payer: Self-pay | Admitting: Family Medicine

## 2015-03-14 ENCOUNTER — Emergency Department (HOSPITAL_COMMUNITY)
Admission: EM | Admit: 2015-03-14 | Discharge: 2015-03-14 | Disposition: A | Discharge status: Home / Self Care | Payer: Medicare Other | Attending: Emergency Medicine | Admitting: Emergency Medicine

## 2015-03-14 VITALS — BP 146/76 | HR 94 | Temp 98.4°F | Resp 20 | Ht 65.0 in | Wt 154.0 lb

## 2015-03-14 DIAGNOSIS — Z85828 Personal history of other malignant neoplasm of skin: Secondary | ICD-10-CM | POA: Diagnosis not present

## 2015-03-14 DIAGNOSIS — J441 Chronic obstructive pulmonary disease with (acute) exacerbation: Secondary | ICD-10-CM

## 2015-03-14 DIAGNOSIS — R05 Cough: Secondary | ICD-10-CM | POA: Diagnosis not present

## 2015-03-14 DIAGNOSIS — Z8739 Personal history of other diseases of the musculoskeletal system and connective tissue: Secondary | ICD-10-CM | POA: Diagnosis not present

## 2015-03-14 DIAGNOSIS — Z87891 Personal history of nicotine dependence: Secondary | ICD-10-CM | POA: Insufficient documentation

## 2015-03-14 DIAGNOSIS — I10 Essential (primary) hypertension: Secondary | ICD-10-CM | POA: Insufficient documentation

## 2015-03-14 DIAGNOSIS — Z79899 Other long term (current) drug therapy: Secondary | ICD-10-CM | POA: Diagnosis not present

## 2015-03-14 DIAGNOSIS — R0602 Shortness of breath: Secondary | ICD-10-CM | POA: Diagnosis not present

## 2015-03-14 DIAGNOSIS — Z880 Allergy status to penicillin: Secondary | ICD-10-CM | POA: Insufficient documentation

## 2015-03-14 LAB — COMPREHENSIVE METABOLIC PANEL
ALT: 22 U/L (ref 0–53)
AST: 22 U/L (ref 0–37)
Albumin: 3.8 g/dL (ref 3.5–5.2)
Alkaline Phosphatase: 58 U/L (ref 39–117)
Anion gap: 10 (ref 5–15)
BUN: 10 mg/dL (ref 6–23)
CO2: 27 mmol/L (ref 19–32)
Calcium: 9.4 mg/dL (ref 8.4–10.5)
Chloride: 100 mmol/L (ref 96–112)
Creatinine, Ser: 0.9 mg/dL (ref 0.50–1.35)
GFR calc Af Amer: >90 mL/min (ref >90)
GFR calc non Af Amer: 81 mL/min — ABNORMAL LOW (ref >90)
Glucose, Bld: 133 mg/dL — ABNORMAL HIGH (ref 70–99)
Potassium: 4.6 mmol/L (ref 3.5–5.1)
Sodium: 137 mmol/L (ref 135–145)
Total Bilirubin: 1.1 mg/dL (ref 0.3–1.2)
Total Protein: 7.1 g/dL (ref 6.0–8.3)

## 2015-03-14 LAB — CBC WITH DIFFERENTIAL/PLATELET
Basophils Absolute: 0 10*3/uL (ref 0.0–0.1)
Basophils Relative: 0 % (ref 0–1)
Eosinophils Absolute: 0 10*3/uL (ref 0.0–0.7)
Eosinophils Relative: 0 % (ref 0–5)
HCT: 46.6 % (ref 39.0–52.0)
Hemoglobin: 15.5 g/dL (ref 13.0–17.0)
Lymphocytes Relative: 8 % — ABNORMAL LOW (ref 12–46)
Lymphs Abs: 1.5 10*3/uL (ref 0.7–4.0)
MCH: 29.6 pg (ref 26.0–34.0)
MCHC: 33.3 g/dL (ref 30.0–36.0)
MCV: 89.1 fL (ref 78.0–100.0)
Monocytes Absolute: 1.6 10*3/uL — ABNORMAL HIGH (ref 0.1–1.0)
Monocytes Relative: 9 % (ref 3–12)
Neutro Abs: 14.6 10*3/uL — ABNORMAL HIGH (ref 1.7–7.7)
Neutrophils Relative %: 83 % — ABNORMAL HIGH (ref 43–77)
Platelets: 246 10*3/uL (ref 150–400)
RBC: 5.23 MIL/uL (ref 4.22–5.81)
RDW: 13.2 % (ref 11.5–15.5)
WBC: 17.8 10*3/uL — ABNORMAL HIGH (ref 4.0–10.5)

## 2015-03-14 MED ORDER — ALBUTEROL SULFATE HFA 108 (90 BASE) MCG/ACT IN AERS: 1.0000 | INHALATION_SPRAY | Freq: Four times a day (QID) | RESPIRATORY_TRACT | Status: DC | PRN

## 2015-03-14 MED ORDER — ALBUTEROL SULFATE (2.5 MG/3ML) 0.083% IN NEBU
2.5000 mg | INHALATION_SOLUTION | Freq: Once | RESPIRATORY_TRACT | Status: AC
  Administered 2015-03-14: 2.5 mg via RESPIRATORY_TRACT
  Filled 2015-03-14: qty 3

## 2015-03-14 MED ORDER — IPRATROPIUM-ALBUTEROL 0.5-2.5 (3) MG/3ML IN SOLN
3.0000 mL | Freq: Once | RESPIRATORY_TRACT | Status: AC
  Administered 2015-03-14: 3 mL via RESPIRATORY_TRACT
  Filled 2015-03-14: qty 3

## 2015-03-14 MED ORDER — TIOTROPIUM BROMIDE MONOHYDRATE 2.5 MCG/ACT IN AERS: INHALATION_SPRAY | RESPIRATORY_TRACT | Status: DC

## 2015-03-14 MED ORDER — AZITHROMYCIN 250 MG PO TABS
500.0000 mg | ORAL_TABLET | Freq: Once | ORAL | Status: AC
  Administered 2015-03-14: 500 mg via ORAL
  Filled 2015-03-14: qty 2

## 2015-03-14 MED ORDER — AZITHROMYCIN 250 MG PO TABS: 250.0000 mg | ORAL_TABLET | Freq: Every day | ORAL | Status: DC

## 2015-03-14 MED ORDER — BENZONATATE 100 MG PO CAPS
200.0000 mg | ORAL_CAPSULE | Freq: Three times a day (TID) | ORAL | Status: DC | PRN
Start: 2015-03-14 — End: 2015-12-28

## 2015-03-14 MED ORDER — METHYLPREDNISOLONE ACETATE 40 MG/ML IJ SUSP
80.0000 mg | Freq: Once | INTRAMUSCULAR | Status: AC
Start: 2015-03-14 — End: 2015-03-14
  Administered 2015-03-14: 80 mg via INTRAMUSCULAR

## 2015-03-14 MED ORDER — PREDNISONE 20 MG PO TABS: 40.0000 mg | ORAL_TABLET | Freq: Every day | ORAL | Status: DC

## 2015-03-14 NOTE — Discharge Instructions (Signed)
Chronic Obstructive Pulmonary Disease Exacerbation ° Chronic obstructive pulmonary disease (COPD) is a common lung problem. In COPD, the flow of air from the lungs is limited. COPD exacerbations are times that breathing gets worse and you need extra treatment. Without treatment they can be life threatening. If they happen often, your lungs can become more damaged. °HOME CARE °· Do not smoke. °· Avoid tobacco smoke and other things that bother your lungs. °· If given, take your antibiotic medicine as told. Finish the medicine even if you start to feel better. °· Only take medicines as told by your doctor. °· Drink enough fluids to keep your pee (urine) clear or pale yellow (unless your doctor has told you not to). °· Use a cool mist machine (vaporizer). °· If you use oxygen or a machine that turns liquid medicine into a mist (nebulizer), continue to use them as told. °· Keep up with shots (vaccinations) as told by your doctor. °· Exercise regularly. °· Eat healthy foods. °· Keep all doctor visits as told. °GET HELP RIGHT AWAY IF: °· You are very short of breath and it gets worse. °· You have trouble talking. °· You have bad chest pain. °· You have blood in your spit (sputum). °· You have a fever. °· You keep throwing up (vomiting). °· You feel weak, or you pass out (faint). °· You feel confused. °· You keep getting worse. °MAKE SURE YOU:  °· Understand these instructions. °· Will watch your condition. °· Will get help right away if you are not doing well or get worse. °Document Released: 11/06/2011 Document Revised: 09/07/2013 Document Reviewed: 07/22/2013 °[employer]® Patient Information ©2015 [employer]. This information is not intended to replace advice given to you by your health care provider. Make sure you discuss any questions you have with your health care provider. ° °

## 2015-03-14 NOTE — Progress Notes (Signed)
Subjective:    Patient ID: Michael Evans, male    DOB: October 13, 1941, 74 y.o.   MRN: [id number]  HPI  02/27/15 Patient was seen in the emergency room and was diagnosed as COPD exacerbation. He was given Levaquin and prednisone. His cough improved dramatically on the prednisone and Levaquin but never completely subsided. Shortly after discontinuing the prednisone and the Levaquin, the patient's cough returned. He now reports dyspnea on exertion. He reports a cough productive of yellow and green sputum. He reports shortness of breath and pleurisy. He also complains of a one-week history of pain on the left side of his face. The pain originates just anterior to the left ear and radiates up his left temple and above his left eye in the pattern of the first branch of the trigeminal nerve. The pain is sharp and intense and constant. He denies any headache. He denies any blurry vision. There is no evidence of shingles or rash in that area.  At that time, my plan was:  I think the patient has a continuation of his COPD exacerbation. Start doxycycline 100 mg by mouth twice a day for 10 days. Resume prednisone 40 mg by mouth daily for 7 days. Discontinue Spiriva and begin stiolto respiumat 2 inhalations once daily. Recheck in one week or sooner if worse.  Consider Tegretol if pain persist.  03/14/15 Patient is here today complaining of worsening shortness of breath. Pulse oximetry is 88% on room air. He complains of wheezing and chest tightness, and chest congestion. His last albuterol treatment was last night. On examination today he has markedly diminished breath sounds throughout. He has expiratory wheezes. He has faint rhonchi in the left lower lobe posteriorly. He has completed 7 days of prednisone 40 mg a day. He has been on both Levaquin and then later doxycycline without improvement. Patient has been switched from spiriva to stiolto and still he continues to decline.   Past Medical History  Diagnosis Date    . COPD (chronic obstructive pulmonary disease)     PFT 06-26-09 FEV1  1.75( 71%), FVC 3.65( 101%), FEV1% 48, TLC 5.53(104%), DLCO 55%, no BD  . Arthritis   . Skin cancer   . Hypertension   . Nasal septal deviation   . Chronic rhinitis   . OSA (obstructive sleep apnea) 04/21/2011  . Nocturia    Past Surgical History  Procedure Laterality Date  . Back surgery    . Appendectomy    . Cataract extraction w/phaco  01/06/2013    Procedure: CATARACT EXTRACTION PHACO AND INTRAOCULAR LENS PLACEMENT (IOC);  Surgeon: Tonny Branch, MD;  Location: [facility];  Service: Ophthalmology;  Laterality: Right;  CDE: 22.17   Current Outpatient Prescriptions on File Prior to Visit  Medication Sig Dispense Refill  . albuterol (PROVENTIL) (2.5 MG/3ML) 0.083% nebulizer solution Take 3 mLs (2.5 mg total) by nebulization every 4 (four) hours as needed for wheezing or shortness of breath. 30 vial 1  . cyanocobalamin 2000 MCG tablet Take 2,000 mcg by mouth daily.    Marland Kitchen doxycycline (VIBRA-TABS) 100 MG tablet Take 1 tablet (100 mg total) by mouth 2 (two) times daily. 20 tablet 0  . Multiple Vitamin (MULTIVITAMIN) tablet Take 1 tablet by mouth daily.    . predniSONE (DELTASONE) 20 MG tablet Take 2 tablets (40 mg total) by mouth daily with breakfast. 14 tablet 0  . PROAIR HFA 108 (90 BASE) MCG/ACT inhaler   2  . Tiotropium Bromide Monohydrate (SPIRIVA RESPIMAT) 2.5 MCG/ACT AERS  Take 2 puffs once a day for COPD 1 Inhaler 6   No current facility-administered medications on file prior to visit.   Allergies  Allergen Reactions  . Aspirin Other (Evans Comments)    Upset stomach.  . Penicillins Hives  . Sulfa Antibiotics Hives   History   Social History  . Marital Status: Divorced    Spouse Name: N/A  . Number of Children: N/A  . 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 Smoking status: Former Smoker -- 1.50 packs/day for 54 years    Types: Cigarettes    Quit date:  12/01/2001  . Smokeless tobacco: Never Used  . Alcohol Use: No  . Drug Use: No  . Sexual Activity: Not on file   Other Topics Concern  . Not on file   Social History Narrative       Review of Systems  All other systems reviewed and are negative.      Objective:   Physical Exam  Constitutional: He is oriented to person, place, and time. He appears well-developed and well-nourished.  HENT:  Right Ear: External ear normal.  Left Ear: External ear normal.  Nose: Nose normal.  Mouth/Throat: Oropharynx is clear and moist. No oropharyngeal exudate.  Eyes: Conjunctivae are normal.  Neck: Neck supple.  Pulmonary/Chest: No accessory muscle usage. No respiratory distress. He has decreased breath sounds in the right upper field, the right lower field, the left upper field and the left lower field. He has wheezes in the right upper field, the right lower field, the left upper field and the left lower field. He has rhonchi in the left lower field.  Lymphadenopathy:    He has no cervical adenopathy.  Neurological: He is alert and oriented to person, place, and time. He has normal reflexes. No cranial nerve deficit. He exhibits normal muscle tone. Coordination normal.          Assessment & Plan:  COPD exacerbation  patient continues to suffer from a COPD exacerbation and has now possibly developed left lower lobe community-acquired pneumonia. He has failed outpatient treatment with Levaquin, doxycycline, one week of prednisone 40 mg by mouth daily as well as increasing his maintenance medication to [employer].    Today at 10:00, patient was given 80 mg of Depo-Medrol IM. I also gave the patient 2.5 mg of albuterol and 0.5 mg of Atrovent and nebulizer treatment. I will reassess the patient in 15-20 minutes to Evans if there is improvement. I'm going to try everything my power to prevent hospitalization however it appears that the patient requires IV steroids with increased frequency, increased  frequency of bronchodilators, and IV antibiotics to resolve a COPD exacerbation. If he is not improving here in the office with intensive therapy, I will refer the patient to the emergency room  At 10:20, breathing had improved, SpO2 was 94% on RA.  Ha LLL rhonchi on exam, wheezing improved.  Will monitor here for 1 hour, if decompensating will need to go to ER.  If stable, can try duonebs q 6 hrs at home, home O2 2 L [place], and z-pack and ceftin 500 bid for 10 days (although remote PCN allergy).  Patient is in aggreement with plan.  By 11:20, patient's wheezing had returned his oxygen saturations were 88% on room air. Patient is failing outpatient therapy and therefore I recommended he go to the emergency room. He agrees and he is in route  at the present time.

## 2015-03-14 NOTE — ED Notes (Signed)
Pt RA sats 93% prior to BR, after BR pt's RA sats 91%, pt did desat to 89% mid way while ambulating in hallway

## 2015-03-14 NOTE — ED Notes (Signed)
Pt sent from MD office with ?pneumonia.  Cough for 1 week  Nonproductive, SOB    Pain rt chest, "sore from coughing"

## 2015-03-15 ENCOUNTER — Ambulatory Visit: Payer: Medicare Other | Admitting: Family Medicine

## 2015-03-16 NOTE — ED Provider Notes (Signed)
CSN: [id number]     Arrival date & time 03/14/15  31 History   First MD Initiated Contact with Patient 03/14/15 1423     Chief Complaint  Patient presents with  . Shortness of Breath     (Consider location/radiation/quality/duration/timing/severity/associated sxs/prior Treatment) HPI   Michael Evans is a 74 y.o. male who presents to the Emergency Department complaining of cough and shortness of breath.  He was sent to the ED by his PMD for further evaluation for possible PNA.  Patient reports non-productive cough, wheezing and shortness of breath for one week.  He states he has been using his inhalers without relief and reports "soreness" along his right ribs from frequent coughing.  He has h/o COPD and was a former smoker.  He states he was given a breathing treatment and a "steroid shot" in his doctors office and advised to come to the ED because his oxygen was low and his PMD was concerned he has pneumonia.  Patient denies fever, post-tussive emesis, bloody sputum, left sided chest pain or chills.     Past Medical History  Diagnosis Date  . COPD (chronic obstructive pulmonary disease)     PFT 06-26-09 FEV1  1.75( 71%), FVC 3.65( 101%), FEV1% 48, TLC 5.53(104%), DLCO 55%, no BD  . Arthritis   . Hypertension   . Nasal septal deviation   . Chronic rhinitis   . OSA (obstructive sleep apnea) 04/21/2011  . Nocturia   . Skin cancer    Past Surgical History  Procedure Laterality Date  . Back surgery    . Appendectomy    . Cataract extraction w/phaco  01/06/2013    Procedure: CATARACT EXTRACTION PHACO AND INTRAOCULAR LENS PLACEMENT (IOC);  Surgeon: Tonny Branch, MD;  Location: [facility];  Service: Ophthalmology;  Laterality: Right;  CDE: 22.17   Family History  Problem Relation Age of Onset  . Lung cancer Mother    History  Substance Use Topics  . Smoking status: Former Smoker -- 1.50 packs/day for 54 years    Types: Cigarettes    Quit date: 12/01/2001  . Smokeless tobacco: Never Used   . Alcohol Use: No    Review of Systems  Constitutional: Negative for fever, chills and appetite change.  HENT: Positive for congestion. Negative for sore throat and trouble swallowing.   Respiratory: Positive for cough, chest tightness, shortness of breath and wheezing.   Cardiovascular: Negative for chest pain.       Right rib pain  Gastrointestinal: Negative for nausea, vomiting, abdominal pain and diarrhea.  Genitourinary: Negative for dysuria and flank pain.  Musculoskeletal: Negative for arthralgias.  Skin: Negative for rash.  Neurological: Negative for dizziness, weakness and numbness.  Hematological: Negative for adenopathy.  All other systems reviewed and are negative.     Allergies  Aspirin; Penicillins; and Sulfa antibiotics  Home Medications   Prior to Admission medications   Medication Sig Start Date End Date Taking? Authorizing Provider  cyanocobalamin 2000 MCG tablet Take 2,000 mcg by mouth daily.   Yes Historical Provider, MD  Multiple Vitamin (MULTIVITAMIN) tablet Take 1 tablet by mouth daily.   Yes Historical Provider, MD  albuterol (PROVENTIL HFA;VENTOLIN HFA) 108 (90 BASE) MCG/ACT inhaler Inhale 1-2 puffs into the lungs every 6 (six) hours as needed for wheezing or shortness of breath. 03/14/15   Derick Seminara, PA-C  azithromycin (ZITHROMAX) 250 MG tablet Take 1 tablet (250 mg total) by mouth daily. Take first 2 tablets together, then 1 every day until finished.  03/14/15   Alexxus Sobh, PA-C  benzonatate (TESSALON) 100 MG capsule Take 2 capsules (200 mg total) by mouth 3 (three) times daily as needed for cough. Swallow whole, do not chew 03/14/15   Fawna Cranmer, PA-C  doxycycline (VIBRA-TABS) 100 MG tablet Take 1 tablet (100 mg total) by mouth 2 (two) times daily. Patient not taking: Reported on 03/14/2015 02/27/15   [name], MD  predniSONE (DELTASONE) 20 MG tablet Take 2 tablets (40 mg total) by mouth daily. For 4 days 03/14/15   Sherree Shankman, PA-C   Tiotropium Bromide Monohydrate (SPIRIVA RESPIMAT) 2.5 MCG/ACT AERS 2 puffs once a day for COPD 03/14/15   Jocelyn Lowery, PA-C   BP 153/98 mmHg  Pulse 92  Temp(Src) 99 F (37.2 C) (Oral)  Resp 18  Ht 5\' 5"  (1.651 m)  Wt 150 lb (68.04 kg)  BMI 24.96 kg/m2  SpO2 93% Physical Exam  Constitutional: He is oriented to person, place, and time. He appears well-developed and well-nourished. No distress.  HENT:  Head: Normocephalic and atraumatic.  Right Ear: Tympanic membrane and ear canal normal.  Left Ear: Tympanic membrane and ear canal normal.  Nose: Mucosal edema and rhinorrhea present.  Mouth/Throat: Uvula is midline, oropharynx is clear and moist and mucous membranes are normal. No oropharyngeal exudate.  Eyes: EOM are normal. Pupils are equal, round, and reactive to light.  Neck: Normal range of motion, full passive range of motion without pain and phonation normal. Neck supple. No JVD present.  Cardiovascular: Normal rate, regular rhythm, normal heart sounds and intact distal pulses.   No murmur heard. Pulmonary/Chest: Effort normal. No stridor. No respiratory distress. He has wheezes. He has no rales. He exhibits tenderness.  Coarse lungs sounds bilaterally. With inspiratory and expiratory wheezes.  No rales. Mild tenderness to palpation of the lateral right ribs.  No edema or crepitus.  Abdominal: Soft. He exhibits no distension. There is no tenderness. There is no rebound and no guarding.  Musculoskeletal: Normal range of motion. He exhibits no edema.  Lymphadenopathy:    He has no cervical adenopathy.  Neurological: He is alert and oriented to person, place, and time. He exhibits normal muscle tone. Coordination normal.  Skin: Skin is warm and dry.  Nursing note and vitals reviewed.   ED Course  Procedures (including critical care time) Labs Review Labs Reviewed  CBC WITH DIFFERENTIAL/PLATELET - Abnormal; Notable for the following:    WBC 17.8 (*)    Neutrophils Relative  % 83 (*)    Neutro Abs 14.6 (*)    Lymphocytes Relative 8 (*)    Monocytes Absolute 1.6 (*)    All other components within normal limits  COMPREHENSIVE METABOLIC PANEL - Abnormal; Notable for the following:    Glucose, Bld 133 (*)    GFR calc non Af Amer 81 (*)    All other components within normal limits    Imaging Review Dg Chest 2 View  03/14/2015   CLINICAL DATA:  Cough, congestion, shortness of breath  EXAM: CHEST  2 VIEW  COMPARISON:  02/11/2015  FINDINGS: Mild right basilar opacity, favored to reflect atelectasis. Left basilar scarring. Underlying chronic interstitial markings. No pleural effusion or pneumothorax.  The heart is normal in size.  Degenerative changes of the visualized thoracolumbar spine.  IMPRESSION: Mild right basilar opacity, favored to reflect atelectasis.   Electronically Signed   By: Julian Hy M.D.   On: 03/14/2015 13:25       EKG Interpretation None  MDM   Final diagnoses:  COPD exacerbation    Pt has received albuterol neb in the dept and had injection of steroids at PMD office just prior to ED arrival.  Lungs sounds have improved and he reports feeling better.    Pt was also seen by Dr. Wilson Singer and care plan discussed.  Pt prefers to try outpatient therapy and agrees to return here if his symptoms worsen.  He has ambulated in the dept and sats remain around 90-91% RA.  Nurse noted a very brief episode of 89%, then returned to low 90's.      Kem Parkinson, PA-C 03/16/15 1707  Virgel Manifold, MD 03/19/15 [id number]

## 2015-03-20 ENCOUNTER — Encounter: Payer: Self-pay | Admitting: Family Medicine

## 2015-03-20 ENCOUNTER — Ambulatory Visit (INDEPENDENT_AMBULATORY_CARE_PROVIDER_SITE_OTHER): Payer: Medicare Other | Admitting: Family Medicine

## 2015-03-20 VITALS — BP 130/80 | HR 70 | Temp 98.0°F | Resp 20 | Ht 65.0 in | Wt 150.0 lb

## 2015-03-20 DIAGNOSIS — J441 Chronic obstructive pulmonary disease with (acute) exacerbation: Secondary | ICD-10-CM | POA: Diagnosis not present

## 2015-03-20 MED ORDER — PREDNISONE 20 MG PO TABS
40.0000 mg | ORAL_TABLET | Freq: Every day | ORAL | Status: DC
Start: 2015-03-20 — End: 2015-03-29

## 2015-03-20 MED ORDER — AZITHROMYCIN 250 MG PO TABS: ORAL_TABLET | ORAL | Status: DC

## 2015-03-20 NOTE — Progress Notes (Signed)
Subjective:    Patient ID: Michael Evans, male    DOB: 1941-05-16, 74 y.o.   MRN: [id number]  HPI  02/27/15 Patient was seen in the emergency room and was diagnosed as COPD exacerbation. He was given Levaquin and prednisone. His cough improved dramatically on the prednisone and Levaquin but never completely subsided. Shortly after discontinuing the prednisone and the Levaquin, the patient's cough returned. He now reports dyspnea on exertion. He reports a cough productive of yellow and green sputum. He reports shortness of breath and pleurisy. He also complains of a one-week history of pain on the left side of his face. The pain originates just anterior to the left ear and radiates up his left temple and above his left eye in the pattern of the first branch of the trigeminal nerve. The pain is sharp and intense and constant. He denies any headache. He denies any blurry vision. There is no evidence of shingles or rash in that area.  At that time, my plan was:  I think the patient has a continuation of his COPD exacerbation. Start doxycycline 100 mg by mouth twice a day for 10 days. Resume prednisone 40 mg by mouth daily for 7 days. Discontinue Spiriva and begin stiolto respiumat 2 inhalations once daily. Recheck in one week or sooner if worse.  Consider Tegretol if pain persist.  03/14/15 Patient is here today complaining of worsening shortness of breath. Pulse oximetry is 88% on room air. He complains of wheezing and chest tightness, and chest congestion. His last albuterol treatment was last night. On examination today he has markedly diminished breath sounds throughout. He has expiratory wheezes. He has faint rhonchi in the left lower lobe posteriorly. He has completed 7 days of prednisone 40 mg a day. He has been on both Levaquin and then later doxycycline without improvement. Patient has been switched from spiriva to stiolto and still he continues to decline.  At that time, my plan was:  patient  continues to suffer from a COPD exacerbation and has now possibly developed left lower lobe community-acquired pneumonia. He has failed outpatient treatment with Levaquin, doxycycline, one week of prednisone 40 mg by mouth daily as well as increasing his maintenance medication to [employer].    Today at 10:00, patient was given 80 mg of Depo-Medrol IM. I also gave the patient 2.5 mg of albuterol and 0.5 mg of Atrovent and nebulizer treatment. I will reassess the patient in 15-20 minutes to see if there is improvement. I'm going to try everything my power to prevent hospitalization however it appears that the patient requires IV steroids with increased frequency, increased frequency of bronchodilators, and IV antibiotics to resolve a COPD exacerbation. If he is not improving here in the office with intensive therapy, I will refer the patient to the emergency room  At 10:20, breathing had improved, SpO2 was 94% on RA.  Ha LLL rhonchi on exam, wheezing improved.  Will monitor here for 1 hour, if decompensating will need to go to ER.  If stable, can try duonebs q 6 hrs at home, home O2 2 L [place], and z-pack and ceftin 500 bid for 10 days (although remote PCN allergy).  Patient is in aggreement with plan.  By 11:20, patient's wheezing had returned his oxygen saturations were 88% on room air. Patient is failing outpatient therapy and therefore I recommended he go to the emergency room. He agrees and he is in route at the present time.  03/20/15 Patient went to the  emergency room where he was given a Z-Pak and 40 mg of prednisone a day for 4 days and sent home.  He is here today for follow-up.  Patient states that he is improving thankfully. His oxygen saturation is 94% on room air. Patient subjectively rates his breathing at 50% of his baseline. He continues to have a cough productive of thick green mucus. He continues to complain of some shortness of breath. Past Medical History  Diagnosis Date  . COPD (chronic  obstructive pulmonary disease)     PFT 06-26-09 FEV1  1.75( 71%), FVC 3.65( 101%), FEV1% 48, TLC 5.53(104%), DLCO 55%, no BD  . Arthritis   . Hypertension   . Nasal septal deviation   . Chronic rhinitis   . OSA (obstructive sleep apnea) 04/21/2011  . Nocturia   . Skin cancer    Past Surgical History  Procedure Laterality Date  . Back surgery    . Appendectomy    . Cataract extraction w/phaco  01/06/2013    Procedure: CATARACT EXTRACTION PHACO AND INTRAOCULAR LENS PLACEMENT (IOC);  Surgeon: Tonny Branch, MD;  Location: [facility];  Service: Ophthalmology;  Laterality: Right;  CDE: 22.17   Current Outpatient Prescriptions on File Prior to Visit  Medication Sig Dispense Refill  . albuterol (PROVENTIL HFA;VENTOLIN HFA) 108 (90 BASE) MCG/ACT inhaler Inhale 1-2 puffs into the lungs every 6 (six) hours as needed for wheezing or shortness of breath. 1 Inhaler 0  . azithromycin (ZITHROMAX) 250 MG tablet Take 1 tablet (250 mg total) by mouth daily. Take first 2 tablets together, then 1 every day until finished. 6 tablet 0  . benzonatate (TESSALON) 100 MG capsule Take 2 capsules (200 mg total) by mouth 3 (three) times daily as needed for cough. Swallow whole, do not chew 21 capsule 0  . cyanocobalamin 2000 MCG tablet Take 2,000 mcg by mouth daily.    Marland Kitchen doxycycline (VIBRA-TABS) 100 MG tablet Take 1 tablet (100 mg total) by mouth 2 (two) times daily. (Patient not taking: Reported on 03/14/2015) 20 tablet 0  . Multiple Vitamin (MULTIVITAMIN) tablet Take 1 tablet by mouth daily.    . predniSONE (DELTASONE) 20 MG tablet Take 2 tablets (40 mg total) by mouth daily. For 4 days 8 tablet 0  . Tiotropium Bromide Monohydrate (SPIRIVA RESPIMAT) 2.5 MCG/ACT AERS 2 puffs once a day for COPD 4 g 0   No current facility-administered medications on file prior to visit.   Allergies  Allergen Reactions  . Aspirin Other (See Comments)    Upset stomach.  . Penicillins Hives  . Sulfa Antibiotics Hives   History   Social  History  . Marital Status: Divorced    Spouse Name: N/A  . Number of Children: N/A  . 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 Smoking status: Former Smoker -- 1.50 packs/day for 54 years    Types: Cigarettes    Quit date: 12/01/2001  . Smokeless tobacco: Never Used  . Alcohol Use: No  . Drug Use: No  . Sexual Activity: Not on file   Other Topics Concern  . Not on file   Social History Narrative       Review of Systems  All other systems reviewed and are negative.      Objective:   Physical Exam  Constitutional: He is oriented to person, place, and time. He appears well-developed and well-nourished.  HENT:  Right Ear: External ear  normal.  Left Ear: External ear normal.  Nose: Nose normal.  Mouth/Throat: Oropharynx is clear and moist. No oropharyngeal exudate.  Eyes: Conjunctivae are normal.  Neck: Neck supple.  Pulmonary/Chest: No accessory muscle usage. No respiratory distress. He has decreased breath sounds in the right upper field, the right lower field, the left upper field and the left lower field. He has wheezes in the right upper field, the right lower field, the left upper field and the left lower field. He has rhonchi in the left lower field.  Lymphadenopathy:    He has no cervical adenopathy.  Neurological: He is alert and oriented to person, place, and time. He has normal reflexes. No cranial nerve deficit. He exhibits normal muscle tone. Coordination normal.          Assessment & Plan:  COPD exacerbation - Plan: predniSONE (DELTASONE) 20 MG tablet, azithromycin (ZITHROMAX) 250 MG tablet  COPD exacerbation seems to be improving. I would like to extend antibiotics for a full 10 days. Therefore I'll give the patient an additional Z-Pak. I would also like him to continue prednisone 40 mg a day for 4 week. I will recheck the patient in one week. At that time I will begin to wean the patient down off  the prednisone gradually given his previous decompensations when prednisone is abruptly withdrawn. We may want to consider giving the patient Zithromax the first week of every month to help prevent COPD exacerbations. We will discuss this further at his next office visit.

## 2015-03-27 ENCOUNTER — Ambulatory Visit (INDEPENDENT_AMBULATORY_CARE_PROVIDER_SITE_OTHER): Payer: Medicare Other | Admitting: Family Medicine

## 2015-03-27 ENCOUNTER — Encounter: Payer: Self-pay | Admitting: Family Medicine

## 2015-03-27 VITALS — BP 130/80 | HR 74 | Temp 97.9°F | Resp 22 | Ht 65.0 in | Wt 157.0 lb

## 2015-03-27 DIAGNOSIS — J438 Other emphysema: Secondary | ICD-10-CM

## 2015-03-27 DIAGNOSIS — J441 Chronic obstructive pulmonary disease with (acute) exacerbation: Secondary | ICD-10-CM

## 2015-03-27 MED ORDER — PREDNISONE 10 MG PO TABS
10.0000 mg | ORAL_TABLET | Freq: Every day | ORAL | Status: DC
Start: 2015-03-27 — End: 2015-05-29

## 2015-03-27 NOTE — Progress Notes (Signed)
Subjective:    Patient ID: Michael Evans, male    DOB: 1941/06/17, 74 y.o.   MRN: [id number]  HPI  02/27/15 Patient was seen in the emergency room and was diagnosed as COPD exacerbation. He was given Levaquin and prednisone. His cough improved dramatically on the prednisone and Levaquin but never completely subsided. Shortly after discontinuing the prednisone and the Levaquin, the patient's cough returned. He now reports dyspnea on exertion. He reports a cough productive of yellow and green sputum. He reports shortness of breath and pleurisy. He also complains of a one-week history of pain on the left side of his face. The pain originates just anterior to the left ear and radiates up his left temple and above his left eye in the pattern of the first branch of the trigeminal nerve. The pain is sharp and intense and constant. He denies any headache. He denies any blurry vision. There is no evidence of shingles or rash in that area.  At that time, my plan was:  I think the patient has a continuation of his COPD exacerbation. Start doxycycline 100 mg by mouth twice a day for 10 days. Resume prednisone 40 mg by mouth daily for 7 days. Discontinue Spiriva and begin stiolto respiumat 2 inhalations once daily. Recheck in one week or sooner if worse.  Consider Tegretol if pain persist.  03/14/15 Patient is here today complaining of worsening shortness of breath. Pulse oximetry is 88% on room air. He complains of wheezing and chest tightness, and chest congestion. His last albuterol treatment was last night. On examination today he has markedly diminished breath sounds throughout. He has expiratory wheezes. He has faint rhonchi in the left lower lobe posteriorly. He has completed 7 days of prednisone 40 mg a day. He has been on both Levaquin and then later doxycycline without improvement. Patient has been switched from spiriva to stiolto and still he continues to decline.  At that time, my plan was:  patient  continues to suffer from a COPD exacerbation and has now possibly developed left lower lobe community-acquired pneumonia. He has failed outpatient treatment with Levaquin, doxycycline, one week of prednisone 40 mg by mouth daily as well as increasing his maintenance medication to [employer].    Today at 10:00, patient was given 80 mg of Depo-Medrol IM. I also gave the patient 2.5 mg of albuterol and 0.5 mg of Atrovent and nebulizer treatment. I will reassess the patient in 15-20 minutes to see if there is improvement. I'm going to try everything my power to prevent hospitalization however it appears that the patient requires IV steroids with increased frequency, increased frequency of bronchodilators, and IV antibiotics to resolve a COPD exacerbation. If he is not improving here in the office with intensive therapy, I will refer the patient to the emergency room  At 10:20, breathing had improved, SpO2 was 94% on RA.  Ha LLL rhonchi on exam, wheezing improved.  Will monitor here for 1 hour, if decompensating will need to go to ER.  If stable, can try duonebs q 6 hrs at home, home O2 2 L [place], and z-pack and ceftin 500 bid for 10 days (although remote PCN allergy).  Patient is in aggreement with plan.  By 11:20, patient's wheezing had returned his oxygen saturations were 88% on room air. Patient is failing outpatient therapy and therefore I recommended he go to the emergency room. He agrees and he is in route at the present time.  03/20/15 Patient went to the  emergency room where he was given a Z-Pak and 40 mg of prednisone a day for 4 days and sent home.  He is here today for follow-up.  Patient states that he is improving thankfully. His oxygen saturation is 94% on room air. Patient subjectively rates his breathing at 50% of his baseline. He continues to have a cough productive of thick green mucus. He continues to complain of some shortness of breath.  At that time, my plan was:  COPD exacerbation seems to be  improving. I would like to extend antibiotics for a full 10 days. Therefore I'll give the patient an additional Z-Pak. I would also like him to continue prednisone 40 mg a day for 4 week. I will recheck the patient in one week. At that time I will begin to wean the patient down off the prednisone gradually given his previous decompensations when prednisone is abruptly withdrawn. We may want to consider giving the patient Zithromax the first week of every month to help prevent COPD exacerbations. We will discuss this further at his next office visit.  03/27/15 Patient is here today for recheck. Pulse oximetry is up to 96% on room air. However he states that his breathing is not much better. He continues to take Spiriva but he does not see benefit from this. He also continues to use albuterol 4 times a day as needed. He continues to complain of chest congestion and chronic mucus production. Past Medical History  Diagnosis Date  . COPD (chronic obstructive pulmonary disease)     PFT 06-26-09 FEV1  1.75( 71%), FVC 3.65( 101%), FEV1% 48, TLC 5.53(104%), DLCO 55%, no BD  . Arthritis   . Hypertension   . Nasal septal deviation   . Chronic rhinitis   . OSA (obstructive sleep apnea) 04/21/2011  . Nocturia   . Skin cancer    Past Surgical History  Procedure Laterality Date  . Back surgery    . Appendectomy    . Cataract extraction w/phaco  01/06/2013    Procedure: CATARACT EXTRACTION PHACO AND INTRAOCULAR LENS PLACEMENT (IOC);  Surgeon: Tonny Branch, MD;  Location: [facility];  Service: Ophthalmology;  Laterality: Right;  CDE: 22.17   Current Outpatient Prescriptions on File Prior to Visit  Medication Sig Dispense Refill  . albuterol (PROVENTIL HFA;VENTOLIN HFA) 108 (90 BASE) MCG/ACT inhaler Inhale 1-2 puffs into the lungs every 6 (six) hours as needed for wheezing or shortness of breath. 1 Inhaler 0  . benzonatate (TESSALON) 100 MG capsule Take 2 capsules (200 mg total) by mouth 3 (three) times daily as needed  for cough. Swallow whole, do not chew 21 capsule 0  . cyanocobalamin 2000 MCG tablet Take 2,000 mcg by mouth daily.    . Multiple Vitamin (MULTIVITAMIN) tablet Take 1 tablet by mouth daily.    . predniSONE (DELTASONE) 20 MG tablet Take 2 tablets (40 mg total) by mouth daily with breakfast. 14 tablet 0  . Tiotropium Bromide Monohydrate (SPIRIVA RESPIMAT) 2.5 MCG/ACT AERS 2 puffs once a day for COPD 4 g 0   No current facility-administered medications on file prior to visit.   Allergies  Allergen Reactions  . Aspirin Other (See Comments)    Upset stomach.  . Penicillins Hives  . Sulfa Antibiotics Hives   History   Social History  . Marital Status: Divorced    Spouse Name: N/A  . Number of Children: N/A  . 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 Smoking status: Former Smoker -- 1.50 packs/day for 54 years    Types: Cigarettes    Quit date: 12/01/2001  . Smokeless tobacco: Never Used  . Alcohol Use: No  . Drug Use: No  . Sexual Activity: Not on file   Other Topics Concern  . Not on file   Social History Narrative       Review of Systems  All other systems reviewed and are negative.      Objective:   Physical Exam  Constitutional: He is oriented to person, place, and time. He appears well-developed and well-nourished.  HENT:  Right Ear: External ear normal.  Left Ear: External ear normal.  Nose: Nose normal.  Mouth/Throat: Oropharynx is clear and moist. No oropharyngeal exudate.  Eyes: Conjunctivae are normal.  Neck: Neck supple.  Pulmonary/Chest: No accessory muscle usage. No respiratory distress. He has decreased breath sounds in the right upper field, the right lower field, the left upper field and the left lower field. He has wheezes in the right upper field, the right lower field, the left upper field and the left lower field. He has rhonchi in the left lower field.  Lymphadenopathy:    He has no  cervical adenopathy.  Neurological: He is alert and oriented to person, place, and time. He has normal reflexes. No cranial nerve deficit. He exhibits normal muscle tone. Coordination normal.          Assessment & Plan:  COPD exacerbation  Other emphysema  Pulmonary function testing today reveals an FEV1 of 1.4 L which is 63% of predicted, an FVC of 3.03 L, and an FEV1 to FVC ratio of 47%. This is borderline stage III. However there has been approximately 0.5 L reduction in his vital capacity since his pulmonary function testing was last performed.    Discontinue stiolto.  Begin Spiriva 2.5 g per actuation, 2 inhalations daily, Symbicort 160/4.5, 2 puffs inhaled twice a day, decrease prednisone to 10 mg by mouth daily. Continue this for one month until seen by pulmonology. I'm going to schedule the patient to follow-up with his pulmonologist.

## 2015-03-29 ENCOUNTER — Ambulatory Visit (INDEPENDENT_AMBULATORY_CARE_PROVIDER_SITE_OTHER): Payer: Medicare Other | Admitting: Pulmonary Disease

## 2015-03-29 ENCOUNTER — Encounter: Payer: Self-pay | Admitting: Pulmonary Disease

## 2015-03-29 VITALS — BP 150/84 | HR 62 | Ht 65.0 in | Wt 155.8 lb

## 2015-03-29 DIAGNOSIS — J449 Chronic obstructive pulmonary disease, unspecified: Secondary | ICD-10-CM | POA: Diagnosis not present

## 2015-03-29 DIAGNOSIS — R0600 Dyspnea, unspecified: Secondary | ICD-10-CM | POA: Diagnosis not present

## 2015-03-29 NOTE — Progress Notes (Signed)
Chief Complaint  Patient presents with  . Follow-up    COPD/OSA> Last seen 2014. Pt reports worsening in cough, mostly at night. Pt denies SOB, wheezing and chest tightness.     History of Present Illness: Michael Evans is a 74 y.o. male former smoker with COPD, and OSA intolerant of CPAP.  I last saw [name] in 2014.  He has noticed getting more cough, wheeze, and sputum.  He is bringing up yellow phlegm.  He is getting more short of breath with activity.  He has been in the ER lots.  He has been getting prednisone and just finished antibiotic course.  He had chest xray which showed Rt lung Atx vs infiltrate.  He has been using spiriva and symbicort.  He has to use nebulizer frequently also.  He denies leg swelling, weight loss, or fever.  TESTS: PSG 05/20/11 >> AHI 8, SpO2 low 87%, PLMI 22 PFT 06/26/09 >> FEV1 1.75(71%), FEV1% 48, TLC 5.53(104%), DLCO 55%, no BD Spirometry 06/14/13 >> FEV1 1.79 (66%), FEV1% 55  PMHx >> HTN  PSHx, Medications, Allergies, Fhx, Shx reviewed.  Physical Exam: Blood pressure 150/84, pulse 62, height 5\' 5"  (1.651 m), weight 155 lb 12.8 oz (70.67 kg), SpO2 95 %. Body mass index is 25.93 kg/(m^2).  General - No distress ENT - No sinus tenderness, no oral exudate, no LAN Cardiac - s1s2 regular, no murmur Chest - No wheeze/rales/dullness Back - No focal tenderness Abd - Soft, non-tender Ext - No edema Neuro - Normal strength Skin - No rashes Psych - normal mood, and behavior   Assessment/Plan:  Dyspnea. Likely related to COPD. Plan: - will check CT chest and PFT's - if pulmonary evaluation/tx does not improve his symptoms, then he might need cardiology assessment  COPD with chronic bronchitis. Plan: - continue prednisone 10 mg daily for now - I don't think he needs additional Abx at this time - continue spiriva, symbicort for now - further assess with CT chest and PFTs - might need to consider adding daliresp  Allergic  rhinitis. Plan: - he can use prn nasal irrigation and OTC anti-histamines   Chesley Mires, MD [place] [facility]/Sleep Pager:  [phone]

## 2015-03-29 NOTE — Patient Instructions (Signed)
Will schedule breathing test (PFT) and CT chest Follow up in 8 weeks

## 2015-04-03 ENCOUNTER — Inpatient Hospital Stay: Admission: RE | Admit: 2015-04-03 | Payer: Medicare Other | Source: Ambulatory Visit

## 2015-04-05 ENCOUNTER — Ambulatory Visit (INDEPENDENT_AMBULATORY_CARE_PROVIDER_SITE_OTHER)
Admission: RE | Admit: 2015-04-05 | Discharge: 2015-04-05 | Disposition: A | Discharge status: Home / Self Care | Payer: Medicare Other | Source: Ambulatory Visit | Attending: Pulmonary Disease | Admitting: Pulmonary Disease

## 2015-04-05 DIAGNOSIS — J449 Chronic obstructive pulmonary disease, unspecified: Secondary | ICD-10-CM | POA: Diagnosis not present

## 2015-04-05 DIAGNOSIS — R0600 Dyspnea, unspecified: Secondary | ICD-10-CM

## 2015-04-05 DIAGNOSIS — R0602 Shortness of breath: Secondary | ICD-10-CM | POA: Diagnosis not present

## 2015-04-05 MED ORDER — IOHEXOL 300 MG/ML  SOLN
80.0000 mL | Freq: Once | INTRAMUSCULAR | Status: AC | PRN
  Administered 2015-04-05: 80 mL via INTRAVENOUS

## 2015-04-16 ENCOUNTER — Telehealth: Payer: Self-pay | Admitting: Pulmonary Disease

## 2015-04-16 NOTE — Telephone Encounter (Signed)
CT chest 04/05/15 >> atherosclerosis, severe centrilobular emphysema, ATX  Will have my nurse inform pt that CT chest shows severe emphysema.  No evidence for scarring or fibrosis in lungs as was concern on chest xray.  He should continue his current inhaler regimen and prednisone.

## 2015-04-17 ENCOUNTER — Encounter: Payer: Self-pay | Admitting: Family Medicine

## 2015-04-18 NOTE — Telephone Encounter (Signed)
Results have been explained to patient, pt expressed understanding. Nothing further needed.  

## 2015-04-23 ENCOUNTER — Other Ambulatory Visit: Payer: Self-pay | Admitting: Family Medicine

## 2015-04-23 ENCOUNTER — Telehealth: Payer: Self-pay | Admitting: Family Medicine

## 2015-04-23 MED ORDER — ALBUTEROL SULFATE (2.5 MG/3ML) 0.083% IN NEBU: 2.5000 mg | INHALATION_SOLUTION | RESPIRATORY_TRACT | Status: DC | PRN

## 2015-04-23 MED ORDER — ALBUTEROL SULFATE HFA 108 (90 BASE) MCG/ACT IN AERS: 1.0000 | INHALATION_SPRAY | Freq: Four times a day (QID) | RESPIRATORY_TRACT | Status: DC | PRN

## 2015-04-23 NOTE — Telephone Encounter (Signed)
Medication called/sent to requested pharmacy  

## 2015-04-23 NOTE — Telephone Encounter (Signed)
[name] Fallert calling for nathan saying he needs refill on his albuterol  Pharmacy told him to contact us  [facility]  [phone]

## 2015-05-02 ENCOUNTER — Encounter: Payer: Self-pay | Admitting: Family Medicine

## 2015-05-04 ENCOUNTER — Ambulatory Visit (INDEPENDENT_AMBULATORY_CARE_PROVIDER_SITE_OTHER): Payer: Medicare Other | Admitting: Pulmonary Disease

## 2015-05-04 DIAGNOSIS — R0600 Dyspnea, unspecified: Secondary | ICD-10-CM

## 2015-05-04 LAB — PULMONARY FUNCTION TEST
DL/VA % pred: 54 %
DL/VA: 2.41 ml/min/mmHg/L
DLCO UNC % PRED: 35 %
DLCO unc: 10.31 ml/min/mmHg
FEF 25-75 Post: 0.98 L/sec
FEF 25-75 Pre: 0.63 L/sec
FEF2575-%Change-Post: 54 %
FEF2575-%PRED-PRE: 32 %
FEF2575-%Pred-Post: 50 %
FEV1-%Change-Post: 21 %
FEV1-%Pred-Post: 61 %
FEV1-%Pred-Pre: 51 %
FEV1-PRE: 1.38 L
FEV1-Post: 1.67 L
FEV1FVC-%CHANGE-POST: 7 %
FEV1FVC-%Pred-Pre: 72 %
FEV6-%Change-Post: 13 %
FEV6-%PRED-POST: 81 %
FEV6-%PRED-PRE: 71 %
FEV6-PRE: 2.52 L
FEV6-Post: 2.86 L
FEV6FVC-%Change-Post: 0 %
FEV6FVC-%PRED-PRE: 103 %
FEV6FVC-%Pred-Post: 103 %
FVC-%Change-Post: 12 %
FVC-%PRED-POST: 78 %
FVC-%PRED-PRE: 69 %
FVC-POST: 2.96 L
FVC-Pre: 2.62 L
POST FEV1/FVC RATIO: 56 %
POST FEV6/FVC RATIO: 97 %
Pre FEV1/FVC ratio: 53 %
Pre FEV6/FVC Ratio: 96 %

## 2015-05-04 NOTE — Progress Notes (Signed)
PFT done today. 

## 2015-05-11 ENCOUNTER — Telehealth: Payer: Self-pay | Admitting: Pulmonary Disease

## 2015-05-11 DIAGNOSIS — M720 Palmar fascial fibromatosis [Dupuytren]: Secondary | ICD-10-CM | POA: Diagnosis not present

## 2015-05-11 NOTE — Telephone Encounter (Signed)
PFT 05/04/15 >> FEV1 1.67 (61%), FEV1% 56, DLCO 35%, + BD.  Unable to do lung volumes.  Will have my nurse inform pt that PFT shows changes from COPD.  He is to continue spiriva, symbicort, and prn albuterol.  Will review in more detail at next ROV.

## 2015-05-15 NOTE — Telephone Encounter (Signed)
Called pt and line rang numerous times, no answer and no VM WCB

## 2015-05-16 NOTE — Telephone Encounter (Signed)
Called and spoke with pt's sister. She states she is his caregiver.  Reviewed results and recs. She voiced understanding and had no further questions.

## 2015-05-28 DIAGNOSIS — G8918 Other acute postprocedural pain: Secondary | ICD-10-CM | POA: Diagnosis not present

## 2015-05-28 DIAGNOSIS — M720 Palmar fascial fibromatosis [Dupuytren]: Secondary | ICD-10-CM | POA: Diagnosis not present

## 2015-05-29 ENCOUNTER — Ambulatory Visit (INDEPENDENT_AMBULATORY_CARE_PROVIDER_SITE_OTHER): Payer: Medicare Other | Admitting: Pulmonary Disease

## 2015-05-29 ENCOUNTER — Encounter: Payer: Self-pay | Admitting: Pulmonary Disease

## 2015-05-29 VITALS — BP 128/88 | HR 81 | Ht 65.0 in | Wt 149.0 lb

## 2015-05-29 DIAGNOSIS — J432 Centrilobular emphysema: Secondary | ICD-10-CM | POA: Diagnosis not present

## 2015-05-29 MED ORDER — UMECLIDINIUM-VILANTEROL 62.5-25 MCG/INH IN AEPB
1.0000 | INHALATION_SPRAY | Freq: Every day | RESPIRATORY_TRACT | Status: AC
Start: 2015-05-29 — End: 2015-05-30

## 2015-05-29 MED ORDER — ALBUTEROL SULFATE HFA 108 (90 BASE) MCG/ACT IN AERS: 1.0000 | INHALATION_SPRAY | Freq: Four times a day (QID) | RESPIRATORY_TRACT | Status: DC | PRN

## 2015-05-29 MED ORDER — UMECLIDINIUM-VILANTEROL 62.5-25 MCG/INH IN AEPB
1.0000 | INHALATION_SPRAY | Freq: Every day | RESPIRATORY_TRACT | Status: DC
Start: 2015-05-29 — End: 2015-10-12

## 2015-05-29 NOTE — Addendum Note (Signed)
Addended by: Osa Craver on: 05/29/2015 03:24 PM   Modules accepted: Orders

## 2015-05-29 NOTE — Patient Instructions (Signed)
Anoro one puff daily Albuterol two puffs every 4 to 6 hours as needed for cough, wheezing, or chest congestion  Stop spiriva and symbicort  Call to update status in 1 week after starting anoro  Follow up in 4 months

## 2015-05-29 NOTE — Progress Notes (Signed)
Chief Complaint  Patient presents with  . Follow-up    Review PFT and CT scan. Needs refill of albuterol hfa and Symbicort. Needs alternative to Spiriva Resp - too expensive. Pt would like to know if he needs to be a maintenance dose of Pred 10mg     History of Present Illness: Michael Evans is a 74 y.o. male former smoker with COPD, and OSA intolerant of CPAP.  Since his last visit he had CT chest and PFTs.  Showed emphysema and progressive decline in DLCO.  He continues to feel short of breath.  He has cough with clear sputum.  He denies chest pain, fever, or swelling.  He had splinter in his hand and had to have surgery due to infection.   TESTS: PSG 05/20/11 >> AHI 8, SpO2 low 87%, PLMI 22 PFT 06/26/09 >> FEV1 1.75(71%), FEV1% 48, TLC 5.53(104%), DLCO 55%, no BD Spirometry 06/14/13 >> FEV1 1.79 (66%), FEV1% 55 CT chest 04/05/15 >> atherosclerosis, severe centrilobular emphysema, ATX PFT 05/04/15 >> FEV1 1.67 (61%), FEV1% 56, DLCO 35%, + BD. Unable to do lung volumes.  PMHx >> HTN  PSHx, Medications, Allergies, Fhx, Shx reviewed.  Physical Exam: BP 128/88 mmHg  Pulse 81  Ht 5\' 5"  (1.651 m)  Wt 149 lb (67.586 kg)  BMI 24.79 kg/m2  SpO2 94%  General - No distress ENT - No sinus tenderness, no oral exudate, no LAN Cardiac - s1s2 regular, no murmur Chest - No wheeze/rales/dullness Back - No focal tenderness Abd - Soft, non-tender Ext - Lt arm in a sling Neuro - Normal strength Skin - No rashes Psych - normal mood, and behavior   Assessment/Plan:  COPD with chronic bronchitis and emphysema. Plan: - change him to anoro in place of spiriva and symbicort - continue prn albuterol - he does not think he could make rehab classes at hospital - discussed importance of maintaining vaccinations  Allergic rhinitis. Plan: - he can use prn nasal irrigation and OTC anti-histamines   Michael Mires, MD [place] [facility]Critical [facility]/Sleep Pager:  [phone]

## 2015-06-05 ENCOUNTER — Telehealth: Payer: Self-pay | Admitting: Pulmonary Disease

## 2015-06-05 DIAGNOSIS — M25642 Stiffness of left hand, not elsewhere classified: Secondary | ICD-10-CM | POA: Diagnosis not present

## 2015-06-05 DIAGNOSIS — Z4789 Encounter for other orthopedic aftercare: Secondary | ICD-10-CM | POA: Diagnosis not present

## 2015-06-05 NOTE — Telephone Encounter (Signed)
Spoke with Michael Evans, the pt's sister  Pt started on Anoro on 05/29/15 Sister states he does not feel this has helped at all He is using rescue inhaler more often  Dr. Halford Chessman, please advise, thanks!

## 2015-06-05 NOTE — Telephone Encounter (Signed)
Okay to have him resume symbicort 160/4.5 two puffs bid.

## 2015-06-05 NOTE — Telephone Encounter (Signed)
Patient's sister notified. Nothing further needed.

## 2015-06-06 ENCOUNTER — Telehealth: Payer: Self-pay | Admitting: Pulmonary Disease

## 2015-06-06 MED ORDER — BUDESONIDE-FORMOTEROL FUMARATE 160-4.5 MCG/ACT IN AERO: 2.0000 | INHALATION_SPRAY | Freq: Two times a day (BID) | RESPIRATORY_TRACT | Status: DC

## 2015-06-06 NOTE — Telephone Encounter (Signed)
Per 06/05/15 OV: Chesley Mires, MD at 06/05/2015 4:57 PM     Status: Signed       Expand All Collapse All   Okay to have him resume symbicort 160/4.5 two puffs bid      --  Called and is aware RX sent in. Nothing further needed

## 2015-06-12 DIAGNOSIS — M25642 Stiffness of left hand, not elsewhere classified: Secondary | ICD-10-CM | POA: Diagnosis not present

## 2015-06-12 DIAGNOSIS — Z4789 Encounter for other orthopedic aftercare: Secondary | ICD-10-CM | POA: Diagnosis not present

## 2015-06-18 DIAGNOSIS — Z4789 Encounter for other orthopedic aftercare: Secondary | ICD-10-CM | POA: Diagnosis not present

## 2015-06-21 DIAGNOSIS — M25642 Stiffness of left hand, not elsewhere classified: Secondary | ICD-10-CM | POA: Diagnosis not present

## 2015-06-25 DIAGNOSIS — M25642 Stiffness of left hand, not elsewhere classified: Secondary | ICD-10-CM | POA: Diagnosis not present

## 2015-06-27 DIAGNOSIS — M25642 Stiffness of left hand, not elsewhere classified: Secondary | ICD-10-CM | POA: Diagnosis not present

## 2015-06-29 DIAGNOSIS — M25642 Stiffness of left hand, not elsewhere classified: Secondary | ICD-10-CM | POA: Diagnosis not present

## 2015-07-03 DIAGNOSIS — M25642 Stiffness of left hand, not elsewhere classified: Secondary | ICD-10-CM | POA: Diagnosis not present

## 2015-07-03 DIAGNOSIS — Z4789 Encounter for other orthopedic aftercare: Secondary | ICD-10-CM | POA: Diagnosis not present

## 2015-07-05 DIAGNOSIS — M25642 Stiffness of left hand, not elsewhere classified: Secondary | ICD-10-CM | POA: Diagnosis not present

## 2015-07-09 DIAGNOSIS — M25642 Stiffness of left hand, not elsewhere classified: Secondary | ICD-10-CM | POA: Diagnosis not present

## 2015-07-11 DIAGNOSIS — M25642 Stiffness of left hand, not elsewhere classified: Secondary | ICD-10-CM | POA: Diagnosis not present

## 2015-07-31 DIAGNOSIS — Z4789 Encounter for other orthopedic aftercare: Secondary | ICD-10-CM | POA: Diagnosis not present

## 2015-07-31 DIAGNOSIS — M25642 Stiffness of left hand, not elsewhere classified: Secondary | ICD-10-CM | POA: Diagnosis not present

## 2015-08-02 DIAGNOSIS — M25642 Stiffness of left hand, not elsewhere classified: Secondary | ICD-10-CM | POA: Diagnosis not present

## 2015-08-08 DIAGNOSIS — M25642 Stiffness of left hand, not elsewhere classified: Secondary | ICD-10-CM | POA: Diagnosis not present

## 2015-08-10 DIAGNOSIS — M25642 Stiffness of left hand, not elsewhere classified: Secondary | ICD-10-CM | POA: Diagnosis not present

## 2015-08-14 DIAGNOSIS — M25642 Stiffness of left hand, not elsewhere classified: Secondary | ICD-10-CM | POA: Diagnosis not present

## 2015-08-16 DIAGNOSIS — M25642 Stiffness of left hand, not elsewhere classified: Secondary | ICD-10-CM | POA: Diagnosis not present

## 2015-08-23 DIAGNOSIS — M25642 Stiffness of left hand, not elsewhere classified: Secondary | ICD-10-CM | POA: Diagnosis not present

## 2015-08-28 DIAGNOSIS — Z4789 Encounter for other orthopedic aftercare: Secondary | ICD-10-CM | POA: Diagnosis not present

## 2015-10-02 ENCOUNTER — Encounter: Payer: Self-pay | Admitting: Family Medicine

## 2015-10-02 ENCOUNTER — Ambulatory Visit (INDEPENDENT_AMBULATORY_CARE_PROVIDER_SITE_OTHER): Payer: Medicare Other | Admitting: Family Medicine

## 2015-10-02 VITALS — BP 160/88 | HR 88 | Temp 98.3°F | Resp 24 | Ht 65.0 in | Wt 153.0 lb

## 2015-10-02 DIAGNOSIS — J441 Chronic obstructive pulmonary disease with (acute) exacerbation: Secondary | ICD-10-CM | POA: Diagnosis not present

## 2015-10-02 MED ORDER — METHYLPREDNISOLONE ACETATE 80 MG/ML IJ SUSP
80.0000 mg | Freq: Once | INTRAMUSCULAR | Status: AC
  Administered 2015-10-02: 80 mg via INTRAMUSCULAR

## 2015-10-02 MED ORDER — LEVOFLOXACIN 500 MG PO TABS: 500.0000 mg | ORAL_TABLET | Freq: Every day | ORAL | Status: DC

## 2015-10-02 MED ORDER — PREDNISONE 20 MG PO TABS: 40.0000 mg | ORAL_TABLET | Freq: Every day | ORAL | Status: DC

## 2015-10-02 MED ORDER — CEFTRIAXONE SODIUM 1 G IJ SOLR
1.0000 g | Freq: Once | INTRAMUSCULAR | Status: AC
  Administered 2015-10-02: 1 g via INTRAMUSCULAR

## 2015-10-02 MED ORDER — ALBUTEROL SULFATE (2.5 MG/3ML) 0.083% IN NEBU: 2.5000 mg | INHALATION_SOLUTION | RESPIRATORY_TRACT | Status: DC | PRN

## 2015-10-02 NOTE — Progress Notes (Signed)
Subjective:    Patient ID: Michael Evans, male    DOB: 1941/01/03, 74 y.o.   MRN: [id number]  HPI The patient's symptoms began 2-3 weeks ago with worsening sob, cough productive of thick yellow sputum. However, it acutely worsened yesterday. He developed worsening shortness of breath over the last 24 hours. Today he is wheezing. He has restricted air movement. He has a prolonged expiratory phase. He has very little air flow on auscultation. He has accessory muscle use. He denies any fevers or chills. Today in office he is hypoxic at 86% on room air. Respiratory rate is slightly elevated. He has a past medical history of COPD along with community-acquired pneumonia. Past Medical History  Diagnosis Date  . COPD (chronic obstructive pulmonary disease) (HCC)     PFT 06-26-09 FEV1  1.75( 71%), FVC 3.65( 101%), FEV1% 48, TLC 5.53(104%), DLCO 55%, no BD  . Arthritis   . Hypertension   . Nasal septal deviation   . Chronic rhinitis   . OSA (obstructive sleep apnea) 04/21/2011  . Nocturia   . Skin cancer    Past Medical History  Diagnosis Date  . COPD (chronic obstructive pulmonary disease) (HCC)     PFT 06-26-09 FEV1  1.75( 71%), FVC 3.65( 101%), FEV1% 48, TLC 5.53(104%), DLCO 55%, no BD  . Arthritis   . Hypertension   . Nasal septal deviation   . Chronic rhinitis   . OSA (obstructive sleep apnea) 04/21/2011  . Nocturia   . Skin cancer    Past Surgical History  Procedure Laterality Date  . Back surgery    . Appendectomy    . Cataract extraction w/phaco  01/06/2013    Procedure: CATARACT EXTRACTION PHACO AND INTRAOCULAR LENS PLACEMENT (IOC);  Surgeon: Tonny Branch, MD;  Location: [facility];  Service: Ophthalmology;  Laterality: Right;  CDE: 22.17   Current Outpatient Prescriptions on File Prior to Visit  Medication Sig Dispense Refill  . albuterol (PROVENTIL HFA;VENTOLIN HFA) 108 (90 BASE) MCG/ACT inhaler Inhale 1-2 puffs into the lungs every 6 (six) hours as needed for wheezing or shortness of  breath. 1 Inhaler 3  . albuterol (PROVENTIL) (2.5 MG/3ML) 0.083% nebulizer solution Take 3 mLs (2.5 mg total) by nebulization every 4 (four) hours as needed for wheezing or shortness of breath. 75 mL 12  . benzonatate (TESSALON) 100 MG capsule Take 2 capsules (200 mg total) by mouth 3 (three) times daily as needed for cough. Swallow whole, do not chew 21 capsule 0  . budesonide-formoterol (SYMBICORT) 160-4.5 MCG/ACT inhaler Inhale 2 puffs into the lungs 2 (two) times daily. 1 Inhaler 6  . cyanocobalamin 2000 MCG tablet Take 2,000 mcg by mouth daily.    . Multiple Vitamin (MULTIVITAMIN) tablet Take 1 tablet by mouth daily.    Marland Kitchen Umeclidinium-Vilanterol (ANORO ELLIPTA) 62.5-25 MCG/INH AEPB Inhale 1 puff into the lungs daily. 1 each 5   No current facility-administered medications on file prior to visit.   Allergies  Allergen Reactions  . Aspirin Other (See Comments)    Upset stomach.  . Penicillins Hives  . Sulfa Antibiotics Hives   Social History   Social History  . Marital Status: Divorced    Spouse Name: N/A  . Number of Children: N/A  . 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 Smoking status: Former Smoker -- 1.50 packs/day for 54 years    Types: Cigarettes    Quit date: 12/01/2001  .  Smokeless tobacco: Never Used  . Alcohol Use: No  . Drug Use: No  . Sexual Activity: Not on file   Other Topics Concern  . Not on file   Social History Narrative      Review of Systems  All other systems reviewed and are negative.      Objective:   Physical Exam  Constitutional: He appears well-developed and well-nourished. No distress.  HENT:  Right Ear: External ear normal.  Left Ear: External ear normal.  Nose: Nose normal.  Mouth/Throat: Oropharynx is clear and moist.  Neck: Neck supple. No JVD present.  Pulmonary/Chest: Accessory muscle usage present. No respiratory distress. He has decreased breath sounds. He has  wheezes in the right upper field, the right middle field, the right lower field, the left upper field, the left middle field and the left lower field. He has no rhonchi. He has no rales. He exhibits no tenderness.  Abdominal: Soft. Bowel sounds are normal. He exhibits no distension. There is no tenderness. There is no rebound.  Musculoskeletal: He exhibits no edema.  Lymphadenopathy:    He has no cervical adenopathy.  Skin: He is not diaphoretic.  Vitals reviewed.         Assessment & Plan:  COPD exacerbation ([place])  Patient was given nebulizer treatment of Duoneb at 4:15.  He was also started on oxygen 3 L via nasal cannula and was given 80 mg of Depo-Medrol.  I plan to reassess the patient in 15 minutes.  Patient is 93% on 3 L via [place].  He also received 1 g of Rocephin today in office.  At 4:30, I gave the patient 2.5 mg of albuterol. After the second nebulizer treatment his breathing improved. He was maintaining his oxygen saturations at 95% on 2 L. He was no longer using accessory muscles. He refuses to go the hospital and wanted to go home. Therefore I started the patient on prednisone 40 mg by mouth daily for 7 days. I will start him on Levaquin 500 milligrams by mouth daily for 7 days. I want him using albuterol 2.5 mg nebs every 6 hours. If symptoms worsen he is to go directly to the emergency room. I will have him rechecked in the office tomorrow.  Continue oxygen 2 L via nasal cannula. We contacted [facility] and they are going to deliver his oxygen to his home tonight. We sent the patient home with oxygen canister and asked him to return it tomorrow.

## 2015-10-03 ENCOUNTER — Ambulatory Visit (INDEPENDENT_AMBULATORY_CARE_PROVIDER_SITE_OTHER): Payer: Medicare Other | Admitting: Family Medicine

## 2015-10-03 VITALS — BP 144/82 | HR 100 | Temp 97.5°F | Resp 24 | Ht 65.0 in | Wt 153.0 lb

## 2015-10-03 DIAGNOSIS — J9601 Acute respiratory failure with hypoxia: Secondary | ICD-10-CM | POA: Diagnosis not present

## 2015-10-03 DIAGNOSIS — J441 Chronic obstructive pulmonary disease with (acute) exacerbation: Secondary | ICD-10-CM

## 2015-10-03 NOTE — Assessment & Plan Note (Signed)
Improved air movement and symptoms, continue oxygen therapy, at reast can decrease to 2-2.5L, needs around 3 L to sustatin himself with exertion. Continue prednisone, antibiotics, nebs every 4 hours while awake, recheck Friday

## 2015-10-03 NOTE — Patient Instructions (Signed)
Continue oxygen 3 L while moving around,  If just sitting decrease between 2 and 3  Continue Prednisone and antibiotics  F/U Friday- Dr. Dennard Schaumann

## 2015-10-03 NOTE — Progress Notes (Signed)
Patient ID: Michael Evans, male   DOB: December 13, 1940, 74 y.o.   MRN: [id number]   Subjective:    Patient ID: Michael Evans, male    DOB: 12/05/1940, 74 y.o.   MRN: [id number]  Patient presents for F/U COPD Exacerbation  patient here to follow-up COPD exacerbation. Also with acute hypoxia requiring oxygen therapy. He was seen yesterday in the office he was given IM prednisone as well as antibiotics. Nebulizer treatment and started on oxygen therapy. He has been using his oxygen on 3 L. He states he can tell that his breathing is much better today he does not feel as tight. He is using his nebulizer right before visit. He also took his first dose of oral prednisone today. He states that he does not like the oxygen but is willing to use it as prescribed.  Initially patient came to the building his oxygen sats were 89% but he did not have oxygen correctly flowing into his nose.    Review Of Systems:  GEN- denies fatigue, fever, weight loss,weakness, recent illness HEENT- denies eye drainage, change in vision, nasal discharge, CVS- denies chest pain, palpitations RESP-+ SOB, +cough, +wheeze ABD- denies N/V, change in stools, abd pain Neuro- denies headache, dizziness, syncope, seizure activity       Objective:    BP 144/82 mmHg  Pulse 100  Temp(Src) 97.5 F (36.4 C) (Oral)  Resp 24  Ht 5\' 5"  (1.651 m)  Wt 153 lb (69.4 kg)  BMI 25.46 kg/m2  SpO2 94% GEN- NAD, alert and oriented x3 HEENT- PERRL, EOMI, non injected sclera, pink conjunctiva, MMM, oropharynx clear Neck- Supple, no retractions  CVS- RRR, no murmur RESP-improved air movement, no rales, no rhonchi occasional wheeze, sats 95% on 2.5L EXT- No edema Pulses- Radial, DP- 2+        Assessment & Plan:      Problem List Items Addressed This Visit    COPD exacerbation (HCC) - Primary    Improved air movement and symptoms, continue oxygen therapy, at reast can decrease to 2-2.5L, needs around 3 L to sustatin himself with  exertion. Continue prednisone, antibiotics, nebs every 4 hours while awake, recheck Friday      Acute respiratory failure with hypoxia [place])      Note: This dictation was prepared with Dragon dictation along with smaller phrase technology. Any transcriptional errors that result from this process are unintentional.

## 2015-10-05 ENCOUNTER — Encounter: Payer: Self-pay | Admitting: Family Medicine

## 2015-10-05 ENCOUNTER — Ambulatory Visit (INDEPENDENT_AMBULATORY_CARE_PROVIDER_SITE_OTHER): Payer: Medicare Other | Admitting: Family Medicine

## 2015-10-05 VITALS — BP 162/90 | HR 80 | Temp 98.4°F | Resp 20 | Wt 153.0 lb

## 2015-10-05 DIAGNOSIS — J441 Chronic obstructive pulmonary disease with (acute) exacerbation: Secondary | ICD-10-CM

## 2015-10-05 NOTE — Progress Notes (Signed)
Subjective:    Patient ID: Michael Evans, male    DOB: 29-Oct-1941, 74 y.o.   MRN: [id number]  HPI 10/03/15 The patient's symptoms began 2-3 weeks ago with worsening sob, cough productive of thick yellow sputum. However, it acutely worsened yesterday. He developed worsening shortness of breath over the last 24 hours. Today he is wheezing. He has restricted air movement. He has a prolonged expiratory phase. He has very little air flow on auscultation. He has accessory muscle use. He denies any fevers or chills. Today in office he is hypoxic at 86% on room air. Respiratory rate is slightly elevated. He has a past medical history of COPD along with community-acquired pneumonia.  At that time, my plan was: Patient was given nebulizer treatment of Duoneb at 4:15.  He was also started on oxygen 3 L via nasal cannula and was given 80 mg of Depo-Medrol.  I plan to reassess the patient in 15 minutes.  Patient is 93% on 3 L via [place].  He also received 1 g of Rocephin today in office.  At 4:30, I gave the patient 2.5 mg of albuterol. After the second nebulizer treatment his breathing improved. He was maintaining his oxygen saturations at 95% on 2 L. He was no longer using accessory muscles. He refuses to go the hospital and wanted to go home. Therefore I started the patient on prednisone 40 mg by mouth daily for 7 days. I will start him on Levaquin 500 milligrams by mouth daily for 7 days. I want him using albuterol 2.5 mg nebs every 6 hours. If symptoms worsen he is to go directly to the emergency room. I will have him rechecked in the office tomorrow.  Continue oxygen 2 L via nasal cannula. We contacted [facility] and they are going to deliver his oxygen to his home tonight. We sent the patient home with oxygen canister and asked him to return it tomorrow.  10/05/15 Patient is doing much better today. His wheezing has subsided. He has no accessory muscle use. He has no increased work of breathing. He has improved air  movement. He continues to have a cough productive of mucus although it is improving. He is also benefiting from oxygen Past Medical History  Diagnosis Date  . COPD (chronic obstructive pulmonary disease) (HCC)     PFT 06-26-09 FEV1  1.75( 71%), FVC 3.65( 101%), FEV1% 48, TLC 5.53(104%), DLCO 55%, no BD  . Arthritis   . Hypertension   . Nasal septal deviation   . Chronic rhinitis   . OSA (obstructive sleep apnea) 04/21/2011  . Nocturia   . Skin cancer    Past Medical History  Diagnosis Date  . COPD (chronic obstructive pulmonary disease) (HCC)     PFT 06-26-09 FEV1  1.75( 71%), FVC 3.65( 101%), FEV1% 48, TLC 5.53(104%), DLCO 55%, no BD  . Arthritis   . Hypertension   . Nasal septal deviation   . Chronic rhinitis   . OSA (obstructive sleep apnea) 04/21/2011  . Nocturia   . Skin cancer    Past Surgical History  Procedure Laterality Date  . Back surgery    . Appendectomy    . Cataract extraction w/phaco  01/06/2013    Procedure: CATARACT EXTRACTION PHACO AND INTRAOCULAR LENS PLACEMENT (IOC);  Surgeon: Tonny Branch, MD;  Location: [facility];  Service: Ophthalmology;  Laterality: Right;  CDE: 22.17   Current Outpatient Prescriptions on File Prior to Visit  Medication Sig Dispense Refill  . albuterol (PROVENTIL HFA;VENTOLIN  HFA) 108 (90 BASE) MCG/ACT inhaler Inhale 1-2 puffs into the lungs every 6 (six) hours as needed for wheezing or shortness of breath. 1 Inhaler 3  . albuterol (PROVENTIL) (2.5 MG/3ML) 0.083% nebulizer solution Take 3 mLs (2.5 mg total) by nebulization every 4 (four) hours as needed for wheezing or shortness of breath. 75 mL 12  . benzonatate (TESSALON) 100 MG capsule Take 2 capsules (200 mg total) by mouth 3 (three) times daily as needed for cough. Swallow whole, do not chew 21 capsule 0  . budesonide-formoterol (SYMBICORT) 160-4.5 MCG/ACT inhaler Inhale 2 puffs into the lungs 2 (two) times daily. 1 Inhaler 6  . cyanocobalamin 2000 MCG tablet Take 2,000 mcg by mouth daily.      Marland Kitchen levofloxacin (LEVAQUIN) 500 MG tablet Take 1 tablet (500 mg total) by mouth daily. 7 tablet 0  . Multiple Vitamin (MULTIVITAMIN) tablet Take 1 tablet by mouth daily.    . predniSONE (DELTASONE) 20 MG tablet Take 2 tablets (40 mg total) by mouth daily with breakfast. 14 tablet 0  . Umeclidinium-Vilanterol (ANORO ELLIPTA) 62.5-25 MCG/INH AEPB Inhale 1 puff into the lungs daily. 1 each 5   No current facility-administered medications on file prior to visit.   Allergies  Allergen Reactions  . Aspirin Other (See Comments)    Upset stomach.  . Penicillins Hives  . Sulfa Antibiotics Hives   Social History   Social History  . Marital Status: Divorced    Spouse Name: N/A  . Number of Children: N/A  . 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 Smoking status: Former Smoker -- 1.50 packs/day for 54 years    Types: Cigarettes    Quit date: 12/01/2001  . Smokeless tobacco: Never Used  . Alcohol Use: No  . Drug Use: No  . Sexual Activity: Not on file   Other Topics Concern  . Not on file   Social History Narrative      Review of Systems  All other systems reviewed and are negative.      Objective:   Physical Exam  Constitutional: He appears well-developed and well-nourished. No distress.  HENT:  Right Ear: External ear normal.  Left Ear: External ear normal.  Nose: Nose normal.  Mouth/Throat: Oropharynx is clear and moist.  Neck: Neck supple. No JVD present.  Pulmonary/Chest: Accessory muscle usage present. No respiratory distress. He has decreased breath sounds. He has wheezes in the right upper field, the right middle field, the right lower field, the left upper field, the left middle field and the left lower field. He has no rhonchi. He has no rales. He exhibits no tenderness.  Abdominal: Soft. Bowel sounds are normal. He exhibits no distension. There is no tenderness. There is no rebound.  Musculoskeletal: He  exhibits no edema.  Lymphadenopathy:    He has no cervical adenopathy.  Skin: He is not diaphoretic.  Vitals reviewed.         Assessment & Plan:  COPD exacerbation ([place])  Finish antibiotics and prednisone. Continue oxygen 3 L via nasal cannula. Recheck here in one week. Hopefully at that time we can discontinue oxygen.

## 2015-10-08 ENCOUNTER — Ambulatory Visit: Payer: Medicare Other | Admitting: Family Medicine

## 2015-10-09 DIAGNOSIS — Z4789 Encounter for other orthopedic aftercare: Secondary | ICD-10-CM | POA: Diagnosis not present

## 2015-10-12 ENCOUNTER — Ambulatory Visit (INDEPENDENT_AMBULATORY_CARE_PROVIDER_SITE_OTHER): Payer: Medicare Other | Admitting: Family Medicine

## 2015-10-12 ENCOUNTER — Encounter: Payer: Self-pay | Admitting: Family Medicine

## 2015-10-12 VITALS — BP 138/84 | HR 68 | Temp 98.2°F | Resp 20 | Ht 65.0 in | Wt 156.0 lb

## 2015-10-12 DIAGNOSIS — J441 Chronic obstructive pulmonary disease with (acute) exacerbation: Secondary | ICD-10-CM

## 2015-10-12 DIAGNOSIS — J438 Other emphysema: Secondary | ICD-10-CM

## 2015-10-12 NOTE — Progress Notes (Signed)
Subjective:    Patient ID: Michael Evans, male    DOB: [birth year], 74 y.o.   MRN: [id number]  HPI 10/03/15 The patient's symptoms began 2-3 weeks ago with worsening sob, cough productive of thick yellow sputum. However, it acutely worsened yesterday. He developed worsening shortness of breath over the last 24 hours. Today he is wheezing. He has restricted air movement. He has a prolonged expiratory phase. He has very little air flow on auscultation. He has accessory muscle use. He denies any fevers or chills. Today in office he is hypoxic at 86% on room air. Respiratory rate is slightly elevated. He has a past medical history of COPD along with community-acquired pneumonia.  At that time, my plan was: Patient was given nebulizer treatment of Duoneb at 4:15.  He was also started on oxygen 3 L via nasal cannula and was given 80 mg of Depo-Medrol.  I plan to reassess the patient in 15 minutes.  Patient is 93% on 3 L via [place].  He also received 1 g of Rocephin today in office.  At 4:30, I gave the patient 2.5 mg of albuterol. After the second nebulizer treatment his breathing improved. He was maintaining his oxygen saturations at 95% on 2 L. He was no longer using accessory muscles. He refuses to go the hospital and wanted to go home. Therefore I started the patient on prednisone 40 mg by mouth daily for 7 days. I will start him on Levaquin 500 milligrams by mouth daily for 7 days. I want him using albuterol 2.5 mg nebs every 6 hours. If symptoms worsen he is to go directly to the emergency room. I will have him rechecked in the office tomorrow.  Continue oxygen 2 L via nasal cannula. We contacted [facility] and they are going to deliver his oxygen to his home tonight. We sent the patient home with oxygen canister and asked him to return it tomorrow.  10/05/15 Patient is doing much better today. His wheezing has subsided. He has no accessory muscle use. He has no increased work of breathing. He has improved air  movement. He continues to have a cough productive of mucus although it is improving. He is also benefiting from oxygen.  At that time, my plan was: Finish antibiotics and prednisone. Continue oxygen 3 L via nasal cannula. Recheck here in one week. Hopefully at that time we can discontinue oxygen.  10/12/15 Patient is doing much better. He is 98% on 3 L. Off oxygen he maintains 98% on room air. With ambulation 120 feet, his oxygen drops to 93%. He feels back to his baseline. His respiratory status has certainly improved. Prior to his attack he was only using Symbicort. He felt like anoro irritated his throat and did not help him as much.  Past Medical History  Diagnosis Date  . COPD (chronic obstructive pulmonary disease) (HCC)     PFT 06-26-09 FEV1  1.75( 71%), FVC 3.65( 101%), FEV1% 48, TLC 5.53(104%), DLCO 55%, no BD  . Arthritis   . Hypertension   . Nasal septal deviation   . Chronic rhinitis   . OSA (obstructive sleep apnea) 04/21/2011  . Nocturia   . Skin cancer    Past Medical History  Diagnosis Date  . COPD (chronic obstructive pulmonary disease) (HCC)     PFT 06-26-09 FEV1  1.75( 71%), FVC 3.65( 101%), FEV1% 48, TLC 5.53(104%), DLCO 55%, no BD  . Arthritis   . Hypertension   . Nasal septal deviation   .  Chronic rhinitis   . OSA (obstructive sleep apnea) 04/21/2011  . Nocturia   . Skin cancer    Past Surgical History  Procedure Laterality Date  . Back surgery    . Appendectomy    . Cataract extraction w/phaco  01/06/2013    Procedure: CATARACT EXTRACTION PHACO AND INTRAOCULAR LENS PLACEMENT (IOC);  Surgeon: Tonny Branch, MD;  Location: [facility];  Service: Ophthalmology;  Laterality: Right;  CDE: 22.17   Current Outpatient Prescriptions on File Prior to Visit  Medication Sig Dispense Refill  . albuterol (PROVENTIL HFA;VENTOLIN HFA) 108 (90 BASE) MCG/ACT inhaler Inhale 1-2 puffs into the lungs every 6 (six) hours as needed for wheezing or shortness of breath. 1 Inhaler 3  .  albuterol (PROVENTIL) (2.5 MG/3ML) 0.083% nebulizer solution Take 3 mLs (2.5 mg total) by nebulization every 4 (four) hours as needed for wheezing or shortness of breath. 75 mL 12  . benzonatate (TESSALON) 100 MG capsule Take 2 capsules (200 mg total) by mouth 3 (three) times daily as needed for cough. Swallow whole, do not chew 21 capsule 0  . budesonide-formoterol (SYMBICORT) 160-4.5 MCG/ACT inhaler Inhale 2 puffs into the lungs 2 (two) times daily. 1 Inhaler 6  . cyanocobalamin 2000 MCG tablet Take 2,000 mcg by mouth daily.    Marland Kitchen levofloxacin (LEVAQUIN) 500 MG tablet Take 1 tablet (500 mg total) by mouth daily. 7 tablet 0  . Multiple Vitamin (MULTIVITAMIN) tablet Take 1 tablet by mouth daily.    . predniSONE (DELTASONE) 20 MG tablet Take 2 tablets (40 mg total) by mouth daily with breakfast. 14 tablet 0  . Umeclidinium-Vilanterol (ANORO ELLIPTA) 62.5-25 MCG/INH AEPB Inhale 1 puff into the lungs daily. 1 each 5   No current facility-administered medications on file prior to visit.   Allergies  Allergen Reactions  . Aspirin Other (See Comments)    Upset stomach.  . Penicillins Hives  . Sulfa Antibiotics Hives   Social History   Social History  . Marital Status: Divorced    Spouse Name: N/A  . Number of Children: N/A  . 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 Smoking status: Former Smoker -- 1.50 packs/day for 54 years    Types: Cigarettes    Quit date: 12/01/2001  . Smokeless tobacco: Never Used  . Alcohol Use: No  . Drug Use: No  . Sexual Activity: Not on file   Other Topics Concern  . Not on file   Social History Narrative      Review of Systems  All other systems reviewed and are negative.      Objective:   Physical Exam  Constitutional: He appears well-developed and well-nourished. No distress.  HENT:  Right Ear: External ear normal.  Left Ear: External ear normal.  Nose: Nose normal.  Mouth/Throat:  Oropharynx is clear and moist.  Neck: Neck supple. No JVD present.  Pulmonary/Chest: No accessory muscle usage. No respiratory distress. He has decreased breath sounds. He has no wheezes. He has no rhonchi. He has no rales. He exhibits no tenderness.  Abdominal: Soft. Bowel sounds are normal. He exhibits no distension. There is no tenderness. There is no rebound.  Musculoskeletal: He exhibits no edema.  Lymphadenopathy:    He has no cervical adenopathy.  Skin: He is not diaphoretic.  Vitals reviewed.         Assessment & Plan:  COPD exacerbation ([place])  Other emphysema ([place])  COPD  exacerbation seems to have resolved. I am okay with him discontinuing continuous use oxygen and using it only as needed. He can use albuterol nebulizers every 6 hours as needed. I want him to continue the Symbicort however I will add Incruse 1 inhalation daily to maximize his treatment for COPD.

## 2015-11-13 ENCOUNTER — Encounter: Payer: Self-pay | Admitting: Family Medicine

## 2015-11-13 ENCOUNTER — Ambulatory Visit (INDEPENDENT_AMBULATORY_CARE_PROVIDER_SITE_OTHER): Payer: Medicare Other | Admitting: Family Medicine

## 2015-11-13 VITALS — BP 120/76 | HR 84 | Temp 98.4°F | Resp 16 | Ht 65.0 in | Wt 160.0 lb

## 2015-11-13 DIAGNOSIS — M7552 Bursitis of left shoulder: Secondary | ICD-10-CM | POA: Diagnosis not present

## 2015-11-13 MED ORDER — DICLOFENAC SODIUM 75 MG PO TBEC: 75.0000 mg | DELAYED_RELEASE_TABLET | Freq: Two times a day (BID) | ORAL | Status: DC

## 2015-11-13 NOTE — Progress Notes (Signed)
Subjective:    Patient ID: Michael Evans, male    DOB: 1941/12/01, 74 y.o.   MRN: [id number]  HPI Patient presents with 3 weeks of pain in his left shoulder. It began after he spent several weeks reaming his hedges and trees and performing overhead activity. He is now unable to lift his arm greater than 90 due to the pain in his left shoulder. He also reports pain with internal and Rotation of the left shoulder. He has a positive empty can sign and positive hawkins.  There is no crepitus in his left shoulder on exam. Past Medical History  Diagnosis Date  . COPD (chronic obstructive pulmonary disease) (HCC)     PFT 06-26-09 FEV1  1.75( 71%), FVC 3.65( 101%), FEV1% 48, TLC 5.53(104%), DLCO 55%, no BD  . Arthritis   . Hypertension   . Nasal septal deviation   . Chronic rhinitis   . OSA (obstructive sleep apnea) 04/21/2011  . Nocturia   . Skin cancer    Past Surgical History  Procedure Laterality Date  . Back surgery    . Appendectomy    . Cataract extraction w/phaco  01/06/2013    Procedure: CATARACT EXTRACTION PHACO AND INTRAOCULAR LENS PLACEMENT (IOC);  Surgeon: Tonny Branch, MD;  Location: [facility];  Service: Ophthalmology;  Laterality: Right;  CDE: 22.17   Current Outpatient Prescriptions on File Prior to Visit  Medication Sig Dispense Refill  . albuterol (PROVENTIL HFA;VENTOLIN HFA) 108 (90 BASE) MCG/ACT inhaler Inhale 1-2 puffs into the lungs every 6 (six) hours as needed for wheezing or shortness of breath. 1 Inhaler 3  . albuterol (PROVENTIL) (2.5 MG/3ML) 0.083% nebulizer solution Take 3 mLs (2.5 mg total) by nebulization every 4 (four) hours as needed for wheezing or shortness of breath. 75 mL 12  . benzonatate (TESSALON) 100 MG capsule Take 2 capsules (200 mg total) by mouth 3 (three) times daily as needed for cough. Swallow whole, do not chew 21 capsule 0  . budesonide-formoterol (SYMBICORT) 160-4.5 MCG/ACT inhaler Inhale 2 puffs into the lungs 2 (two) times daily. 1 Inhaler 6  .  cyanocobalamin 2000 MCG tablet Take 2,000 mcg by mouth daily.    Marland Kitchen levofloxacin (LEVAQUIN) 500 MG tablet Take 1 tablet (500 mg total) by mouth daily. 7 tablet 0  . Multiple Vitamin (MULTIVITAMIN) tablet Take 1 tablet by mouth daily.    . predniSONE (DELTASONE) 20 MG tablet Take 2 tablets (40 mg total) by mouth daily with breakfast. 14 tablet 0  . Umeclidinium Bromide (INCRUSE ELLIPTA) 62.5 MCG/INH AEPB Inhale 1 Inhaler into the lungs daily.     No current facility-administered medications on file prior to visit.   Allergies  Allergen Reactions  . Aspirin Other (See Comments)    Upset stomach.  . Penicillins Hives  . Sulfa Antibiotics Hives   Social History   Social History  . Marital Status: Divorced    Spouse Name: N/A  . Number of Children: N/A  . 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 Smoking status: Former Smoker -- 1.50 packs/day for 54 years    Types: Cigarettes    Quit date: 12/01/2001  . Smokeless tobacco: Never Used  . Alcohol Use: No  . Drug Use: No  . Sexual Activity: Not on file   Other Topics Concern  . Not on file   Social History Narrative      Review of Systems  All other systems reviewed and are negative.      Objective:   Physical Exam  Cardiovascular: Normal rate, regular rhythm and normal heart sounds.   Pulmonary/Chest: Effort normal and breath sounds normal. No respiratory distress. He has no wheezes.  Musculoskeletal:       Left shoulder: He exhibits decreased range of motion, tenderness, pain, spasm and decreased strength. He exhibits no crepitus.  Vitals reviewed.         Assessment & Plan:  Subacromial bursitis, left - Plan: diclofenac (VOLTAREN) 75 MG EC tablet  I believe the patient has bursitis in his left shoulder. Begin diclofenac 75 mg by mouth twice a day for 2 weeks. Apply ice 3 times a day and rest the shoulder is much as possible. If no better in 2-3 weeks,  return for cortisone injection

## 2015-12-28 ENCOUNTER — Encounter: Payer: Self-pay | Admitting: Family Medicine

## 2015-12-28 ENCOUNTER — Ambulatory Visit (INDEPENDENT_AMBULATORY_CARE_PROVIDER_SITE_OTHER): Payer: Medicare Other | Admitting: Family Medicine

## 2015-12-28 VITALS — BP 146/78 | HR 86 | Temp 98.5°F | Resp 16 | Wt 150.0 lb

## 2015-12-28 DIAGNOSIS — J441 Chronic obstructive pulmonary disease with (acute) exacerbation: Secondary | ICD-10-CM

## 2015-12-28 MED ORDER — LEVOFLOXACIN 500 MG PO TABS
500.0000 mg | ORAL_TABLET | Freq: Every day | ORAL | Status: DC
Start: 2015-12-28 — End: 2016-01-14

## 2015-12-28 MED ORDER — PREDNISONE 20 MG PO TABS: 60.0000 mg | ORAL_TABLET | Freq: Every day | ORAL | Status: DC

## 2015-12-28 MED ORDER — METHYLPREDNISOLONE ACETATE 80 MG/ML IJ SUSP
80.0000 mg | Freq: Once | INTRAMUSCULAR | Status: AC
  Administered 2015-12-28: 80 mg via INTRAMUSCULAR

## 2015-12-28 NOTE — Addendum Note (Signed)
Addended by: Shary Decamp B on: 12/28/2015 12:28 PM   Modules accepted: Orders

## 2015-12-28 NOTE — Progress Notes (Signed)
Subjective:    Patient ID: Michael Evans, male    DOB: 10-21-1941, 75 y.o.   MRN: [id number]  HPI  symptoms began 2 days ago. He reports nonproductive cough, increasing shortness of breath, wheezing , and dyspnea on exertion. He denies any chest pain. He denies any fever. He denies any purulent sputum. Patient has a past medical history of emphysema and frequently has steroid-dependent exacerbations. Today on examination he has increased work of breathing, markedly diminished breath sounds bilaterally with expiratory wheezing. His last breathing treatment was last night. Past Medical History  Diagnosis Date  . COPD (chronic obstructive pulmonary disease) (HCC)     PFT 06-26-09 FEV1  1.75( 71%), FVC 3.65( 101%), FEV1% 48, TLC 5.53(104%), DLCO 55%, no BD  . Arthritis   . Hypertension   . Nasal septal deviation   . Chronic rhinitis   . OSA (obstructive sleep apnea) 04/21/2011  . Nocturia   . Skin cancer    Past Surgical History  Procedure Laterality Date  . Back surgery    . Appendectomy    . Cataract extraction w/phaco  01/06/2013    Procedure: CATARACT EXTRACTION PHACO AND INTRAOCULAR LENS PLACEMENT (IOC);  Surgeon: Tonny Branch, MD;  Location: [facility];  Service: Ophthalmology;  Laterality: Right;  CDE: 22.17   Current Outpatient Prescriptions on File Prior to Visit  Medication Sig Dispense Refill  . albuterol (PROVENTIL HFA;VENTOLIN HFA) 108 (90 BASE) MCG/ACT inhaler Inhale 1-2 puffs into the lungs every 6 (six) hours as needed for wheezing or shortness of breath. 1 Inhaler 3  . albuterol (PROVENTIL) (2.5 MG/3ML) 0.083% nebulizer solution Take 3 mLs (2.5 mg total) by nebulization every 4 (four) hours as needed for wheezing or shortness of breath. 75 mL 12  . budesonide-formoterol (SYMBICORT) 160-4.5 MCG/ACT inhaler Inhale 2 puffs into the lungs 2 (two) times daily. 1 Inhaler 6  . cyanocobalamin 2000 MCG tablet Take 2,000 mcg by mouth daily.    . diclofenac (VOLTAREN) 75 MG EC tablet Take 1  tablet (75 mg total) by mouth 2 (two) times daily. 30 tablet 0  . Multiple Vitamin (MULTIVITAMIN) tablet Take 1 tablet by mouth daily.    Marland Kitchen Umeclidinium Bromide (INCRUSE ELLIPTA) 62.5 MCG/INH AEPB Inhale 1 Inhaler into the lungs daily.     No current facility-administered medications on file prior to visit.   Allergies  Allergen Reactions  . Aspirin Other (See Comments)    Upset stomach.  . Penicillins Hives  . Sulfa Antibiotics Hives   Social History   Social History  . Marital Status: Divorced    Spouse Name: N/A  . Number of Children: N/A  . 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 Smoking status: Former Smoker -- 1.50 packs/day for 54 years    Types: Cigarettes    Quit date: 12/01/2001  . Smokeless tobacco: Never Used  . Alcohol Use: No  . Drug Use: No  . Sexual Activity: Not on file   Other Topics Concern  . Not on file   Social History Narrative      Review of Systems  All other systems reviewed and are negative.      Objective:   Physical Exam  Cardiovascular: Normal rate, regular rhythm and normal heart sounds.   Pulmonary/Chest: Effort normal. No accessory muscle usage. No respiratory distress. He has decreased breath sounds. He has wheezes. He has no rales.  Vitals reviewed.  Assessment & Plan:  COPD exacerbation ([place])   patient was given a DuoNeb at 11:00. I will reassess the patient in 15 minutes. I will also give him 80 mg of Depo-Medrol.  Breathing improved dramatically.  Begin prednisone 60 mg poqday tomorrow and Monday.  At present, doesn't need abx.  If worse begin levaquin 500 mg poqday.

## 2015-12-31 ENCOUNTER — Encounter: Payer: Self-pay | Admitting: Family Medicine

## 2015-12-31 ENCOUNTER — Ambulatory Visit (INDEPENDENT_AMBULATORY_CARE_PROVIDER_SITE_OTHER): Payer: Medicare Other | Admitting: Family Medicine

## 2015-12-31 VITALS — BP 170/96 | HR 88 | Temp 97.8°F | Resp 20 | Ht 65.0 in | Wt 156.0 lb

## 2015-12-31 DIAGNOSIS — I10 Essential (primary) hypertension: Secondary | ICD-10-CM | POA: Diagnosis not present

## 2015-12-31 DIAGNOSIS — J441 Chronic obstructive pulmonary disease with (acute) exacerbation: Secondary | ICD-10-CM | POA: Diagnosis not present

## 2015-12-31 MED ORDER — LOSARTAN POTASSIUM 50 MG PO TABS: 50.0000 mg | ORAL_TABLET | Freq: Every day | ORAL | Status: DC

## 2015-12-31 MED ORDER — PREDNISONE 20 MG PO TABS: 20.0000 mg | ORAL_TABLET | Freq: Every day | ORAL | Status: DC

## 2015-12-31 NOTE — Progress Notes (Signed)
Subjective:    Patient ID: Michael Evans, male    DOB: 01-27-1941, 75 y.o.   MRN: [id number]  HPI 12/28/15  symptoms began 2 days ago. He reports nonproductive cough, increasing shortness of breath, wheezing , and dyspnea on exertion. He denies any chest pain. He denies any fever. He denies any purulent sputum. Patient has a past medical history of emphysema and frequently has steroid-dependent exacerbations. Today on examination he has increased work of breathing, markedly diminished breath sounds bilaterally with expiratory wheezing. His last breathing treatment was last night.  At that time, my plan was:  patient was given a DuoNeb at 11:00. I will reassess the patient in 15 minutes. I will also give him 80 mg of Depo-Medrol.  Breathing improved dramatically.  Begin prednisone 60 mg poqday tomorrow and Monday.  At present, doesn't need abx.  If worse begin levaquin 500 mg poqday.  12/31/15 Patient takes that he feels much better today. His breathing has improved although he does still have diminished breath sounds throughout. However his lungs sound almost back to his baseline. He is still taking 60 mg of prednisone a day. His blood pressures also significantly elevated. Past Medical History  Diagnosis Date  . COPD (chronic obstructive pulmonary disease) (HCC)     PFT 06-26-09 FEV1  1.75( 71%), FVC 3.65( 101%), FEV1% 48, TLC 5.53(104%), DLCO 55%, no BD  . Arthritis   . Hypertension   . Nasal septal deviation   . Chronic rhinitis   . OSA (obstructive sleep apnea) 04/21/2011  . Nocturia   . Skin cancer    Past Surgical History  Procedure Laterality Date  . Back surgery    . Appendectomy    . Cataract extraction w/phaco  01/06/2013    Procedure: CATARACT EXTRACTION PHACO AND INTRAOCULAR LENS PLACEMENT (IOC);  Surgeon: Tonny Branch, MD;  Location: [facility];  Service: Ophthalmology;  Laterality: Right;  CDE: 22.17   Current Outpatient Prescriptions on File Prior to Visit  Medication Sig  Dispense Refill  . albuterol (PROVENTIL HFA;VENTOLIN HFA) 108 (90 BASE) MCG/ACT inhaler Inhale 1-2 puffs into the lungs every 6 (six) hours as needed for wheezing or shortness of breath. 1 Inhaler 3  . albuterol (PROVENTIL) (2.5 MG/3ML) 0.083% nebulizer solution Take 3 mLs (2.5 mg total) by nebulization every 4 (four) hours as needed for wheezing or shortness of breath. 75 mL 12  . budesonide-formoterol (SYMBICORT) 160-4.5 MCG/ACT inhaler Inhale 2 puffs into the lungs 2 (two) times daily. 1 Inhaler 6  . cyanocobalamin 2000 MCG tablet Take 2,000 mcg by mouth daily.    . diclofenac (VOLTAREN) 75 MG EC tablet Take 1 tablet (75 mg total) by mouth 2 (two) times daily. 30 tablet 0  . Multiple Vitamin (MULTIVITAMIN) tablet Take 1 tablet by mouth daily.    Marland Kitchen Umeclidinium Bromide (INCRUSE ELLIPTA) 62.5 MCG/INH AEPB Inhale 1 Inhaler into the lungs daily.    Marland Kitchen levofloxacin (LEVAQUIN) 500 MG tablet Take 1 tablet (500 mg total) by mouth daily. (Patient not taking: Reported on 12/31/2015) 7 tablet 0   No current facility-administered medications on file prior to visit.   Allergies  Allergen Reactions  . Aspirin Other (See Comments)    Upset stomach.  . Penicillins Hives  . Sulfa Antibiotics Hives   Social History   Social History  . Marital Status: Divorced    Spouse Name: N/A  . Number of Children: N/A  . 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 Smoking status: Former Smoker -- 1.50 packs/day for 54 years    Types: Cigarettes    Quit date: 12/01/2001  . Smokeless tobacco: Never Used  . Alcohol Use: No  . Drug Use: No  . Sexual Activity: Not on file   Other Topics Concern  . Not on file   Social History Narrative      Review of Systems  All other systems reviewed and are negative.      Objective:   Physical Exam  Cardiovascular: Normal rate, regular rhythm and normal heart sounds.   Pulmonary/Chest: Effort normal. No  accessory muscle usage. No respiratory distress. He has decreased breath sounds. He has no wheezes. He has no rales.  Vitals reviewed.         Assessment & Plan:  COPD exacerbation ([place]) - Plan: predniSONE (DELTASONE) 20 MG tablet  Benign essential HTN - Plan: losartan (COZAAR) 50 MG tablet patient has diminished breath sounds but he is back to his baseline. He always has distant breath sounds with diminished airflow given his COPD/emphysema. I believe we can successfully begin to wean him off prednisone at this point. I recommended 40 mg a day of prednisone for 3 days and then 20 mg of prednisone a day for 3 days and then discontinue the medication. I will also start the patient on losartan 50 mg by mouth daily for hypertension. I asked the patient to return in one month for complete physical exam and at that time I will recheck his blood pressure. I would like him to return fasting a few days prior to the physical exam for a CBC, CMP, fasting lipid panel, and a PSA.

## 2016-01-01 ENCOUNTER — Ambulatory Visit: Payer: Medicare Other | Admitting: Family Medicine

## 2016-01-14 ENCOUNTER — Ambulatory Visit (INDEPENDENT_AMBULATORY_CARE_PROVIDER_SITE_OTHER): Payer: Medicare Other | Admitting: Family Medicine

## 2016-01-14 ENCOUNTER — Encounter: Payer: Self-pay | Admitting: Family Medicine

## 2016-01-14 VITALS — BP 138/74 | HR 90 | Temp 98.1°F | Resp 22 | Ht 65.0 in | Wt 158.0 lb

## 2016-01-14 DIAGNOSIS — J441 Chronic obstructive pulmonary disease with (acute) exacerbation: Secondary | ICD-10-CM | POA: Diagnosis not present

## 2016-01-14 MED ORDER — PREDNISONE 20 MG PO TABS: 60.0000 mg | ORAL_TABLET | Freq: Every day | ORAL | Status: DC

## 2016-01-14 MED ORDER — METHYLPREDNISOLONE ACETATE 80 MG/ML IJ SUSP
80.0000 mg | Freq: Once | INTRAMUSCULAR | Status: AC
  Administered 2016-01-14: 80 mg via INTRAMUSCULAR

## 2016-01-14 NOTE — Addendum Note (Signed)
Addended by: Shary Decamp B on: 01/14/2016 09:58 AM   Modules accepted: Orders

## 2016-01-14 NOTE — Progress Notes (Signed)
Subjective:    Patient ID: Michael Evans, male    DOB: Mar 23, 1941, 75 y.o.   MRN: [id number]  HPI 12/28/15  symptoms began 2 days ago. He reports nonproductive cough, increasing shortness of breath, wheezing , and dyspnea on exertion. He denies any chest pain. He denies any fever. He denies any purulent sputum. Patient has a past medical history of emphysema and frequently has steroid-dependent exacerbations. Today on examination he has increased work of breathing, markedly diminished breath sounds bilaterally with expiratory wheezing. His last breathing treatment was last night.  At that time, my plan was:  patient was given a DuoNeb at 11:00. I will reassess the patient in 15 minutes. I will also give him 80 mg of Depo-Medrol.  Breathing improved dramatically.  Begin prednisone 60 mg poqday tomorrow and Monday.  At present, doesn't need abx.  If worse begin levaquin 500 mg poqday.  12/31/15 Patient takes that he feels much better today. His breathing has improved although he does still have diminished breath sounds throughout. However his lungs sound almost back to his baseline. He is still taking 60 mg of prednisone a day. His blood pressures also significantly elevated.  At that time, my plan was: patient has diminished breath sounds but he is back to his baseline. He always has distant breath sounds with diminished airflow given his COPD/emphysema. I believe we can successfully begin to wean him off prednisone at this point. I recommended 40 mg a day of prednisone for 3 days and then 20 mg of prednisone a day for 3 days and then discontinue the medication. I will also start the patient on losartan 50 mg by mouth daily for hypertension. I asked the patient to return in one month for complete physical exam and at that time I will recheck his blood pressure. I would like him to return fasting a few days prior to the physical exam for a CBC, CMP, fasting lipid panel, and a PSA.  01/14/16 Within 3  days of discontinuing prednisone his symptoms returned. He is now wheezing short of breath, cough productive of thick yellow mucus. He denies any fever. He does report chest congestion. He denies any hemoptysis. He denies any chest pain. He denies any pleurisy. He has markedly diminished breath sounds with faint expiratory wheezes. He is not wheezing much on exam due to the fact he is not moving very much air. Patient has discontinued Incruse.  He is still taking Symbicort Past Medical History  Diagnosis Date  . COPD (chronic obstructive pulmonary disease) (HCC)     PFT 06-26-09 FEV1  1.75( 71%), FVC 3.65( 101%), FEV1% 48, TLC 5.53(104%), DLCO 55%, no BD  . Arthritis   . Hypertension   . Nasal septal deviation   . Chronic rhinitis   . OSA (obstructive sleep apnea) 04/21/2011  . Nocturia   . Skin cancer    Past Surgical History  Procedure Laterality Date  . Back surgery    . Appendectomy    . Cataract extraction w/phaco  01/06/2013    Procedure: CATARACT EXTRACTION PHACO AND INTRAOCULAR LENS PLACEMENT (IOC);  Surgeon: Tonny Branch, MD;  Location: [facility];  Service: Ophthalmology;  Laterality: Right;  CDE: 22.17   Current Outpatient Prescriptions on File Prior to Visit  Medication Sig Dispense Refill  . albuterol (PROVENTIL HFA;VENTOLIN HFA) 108 (90 BASE) MCG/ACT inhaler Inhale 1-2 puffs into the lungs every 6 (six) hours as needed for wheezing or shortness of breath. 1 Inhaler 3  . albuterol (  PROVENTIL) (2.5 MG/3ML) 0.083% nebulizer solution Take 3 mLs (2.5 mg total) by nebulization every 4 (four) hours as needed for wheezing or shortness of breath. 75 mL 12  . budesonide-formoterol (SYMBICORT) 160-4.5 MCG/ACT inhaler Inhale 2 puffs into the lungs 2 (two) times daily. 1 Inhaler 6  . cyanocobalamin 2000 MCG tablet Take 2,000 mcg by mouth daily.    . diclofenac (VOLTAREN) 75 MG EC tablet Take 1 tablet (75 mg total) by mouth 2 (two) times daily. 30 tablet 0  . losartan (COZAAR) 50 MG tablet Take 1  tablet (50 mg total) by mouth daily. 90 tablet 3  . Multiple Vitamin (MULTIVITAMIN) tablet Take 1 tablet by mouth daily.    Marland Kitchen Umeclidinium Bromide (INCRUSE ELLIPTA) 62.5 MCG/INH AEPB Inhale 1 Inhaler into the lungs daily.    . predniSONE (DELTASONE) 20 MG tablet Take 1 tablet (20 mg total) by mouth daily with breakfast. (Patient not taking: Reported on 01/14/2016) 15 tablet 0   No current facility-administered medications on file prior to visit.   Allergies  Allergen Reactions  . Aspirin Other (See Comments)    Upset stomach.  . Penicillins Hives  . Sulfa Antibiotics Hives   Social History   Social History  . Marital Status: Divorced    Spouse Name: N/A  . Number of Children: N/A  . 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 Smoking status: Former Smoker -- 1.50 packs/day for 54 years    Types: Cigarettes    Quit date: 12/01/2001  . Smokeless tobacco: Never Used  . Alcohol Use: No  . Drug Use: No  . Sexual Activity: Not on file   Other Topics Concern  . Not on file   Social History Narrative      Review of Systems  All other systems reviewed and are negative.      Objective:   Physical Exam  Cardiovascular: Normal rate, regular rhythm and normal heart sounds.   Pulmonary/Chest: Effort normal. No accessory muscle usage. No respiratory distress. He has decreased breath sounds. He has no wheezes. He has no rales.  Vitals reviewed.         Assessment & Plan:  COPD exacerbation ([place]) - Plan: predniSONE (DELTASONE) 20 MG tablet Continue symbicort twice daily. Resume incruse once daily.  He received Depo-Medrol 80 mg IM 1 now. Begin prednisone 60 mg by mouth daily starting tomorrow. Recheck on Thursday. Use albuterol 2.5 mg nebs every 6 hours. I see no indication for antibiotics at the present time. He may need to stay on low-dose prednisone indefinitely afterwards

## 2016-01-16 ENCOUNTER — Ambulatory Visit: Payer: Medicare Other | Admitting: Pulmonary Disease

## 2016-01-21 ENCOUNTER — Encounter (HOSPITAL_COMMUNITY): Payer: Self-pay

## 2016-01-21 ENCOUNTER — Emergency Department (HOSPITAL_COMMUNITY): Payer: Medicare Other

## 2016-01-21 DIAGNOSIS — I10 Essential (primary) hypertension: Secondary | ICD-10-CM | POA: Diagnosis present

## 2016-01-21 DIAGNOSIS — J9621 Acute and chronic respiratory failure with hypoxia: Secondary | ICD-10-CM | POA: Diagnosis present

## 2016-01-21 DIAGNOSIS — Z9981 Dependence on supplemental oxygen: Secondary | ICD-10-CM

## 2016-01-21 DIAGNOSIS — E860 Dehydration: Secondary | ICD-10-CM | POA: Diagnosis not present

## 2016-01-21 DIAGNOSIS — J189 Pneumonia, unspecified organism: Secondary | ICD-10-CM | POA: Diagnosis not present

## 2016-01-21 DIAGNOSIS — J441 Chronic obstructive pulmonary disease with (acute) exacerbation: Secondary | ICD-10-CM | POA: Diagnosis not present

## 2016-01-21 DIAGNOSIS — Z87891 Personal history of nicotine dependence: Secondary | ICD-10-CM | POA: Diagnosis not present

## 2016-01-21 DIAGNOSIS — Z7951 Long term (current) use of inhaled steroids: Secondary | ICD-10-CM

## 2016-01-21 DIAGNOSIS — Z23 Encounter for immunization: Secondary | ICD-10-CM

## 2016-01-21 DIAGNOSIS — J1000 Influenza due to other identified influenza virus with unspecified type of pneumonia: Secondary | ICD-10-CM | POA: Diagnosis not present

## 2016-01-21 DIAGNOSIS — E1165 Type 2 diabetes mellitus with hyperglycemia: Secondary | ICD-10-CM | POA: Diagnosis present

## 2016-01-21 DIAGNOSIS — Z85828 Personal history of other malignant neoplasm of skin: Secondary | ICD-10-CM

## 2016-01-21 DIAGNOSIS — K219 Gastro-esophageal reflux disease without esophagitis: Secondary | ICD-10-CM | POA: Diagnosis present

## 2016-01-21 DIAGNOSIS — Z880 Allergy status to penicillin: Secondary | ICD-10-CM

## 2016-01-21 DIAGNOSIS — G4733 Obstructive sleep apnea (adult) (pediatric): Secondary | ICD-10-CM | POA: Diagnosis present

## 2016-01-21 DIAGNOSIS — J440 Chronic obstructive pulmonary disease with acute lower respiratory infection: Secondary | ICD-10-CM | POA: Diagnosis present

## 2016-01-21 DIAGNOSIS — R0602 Shortness of breath: Secondary | ICD-10-CM | POA: Diagnosis not present

## 2016-01-21 DIAGNOSIS — E538 Deficiency of other specified B group vitamins: Secondary | ICD-10-CM | POA: Diagnosis present

## 2016-01-21 DIAGNOSIS — Z801 Family history of malignant neoplasm of trachea, bronchus and lung: Secondary | ICD-10-CM

## 2016-01-21 DIAGNOSIS — R05 Cough: Secondary | ICD-10-CM | POA: Diagnosis not present

## 2016-01-21 DIAGNOSIS — A419 Sepsis, unspecified organism: Principal | ICD-10-CM | POA: Diagnosis present

## 2016-01-21 DIAGNOSIS — Z66 Do not resuscitate: Secondary | ICD-10-CM | POA: Diagnosis present

## 2016-01-21 NOTE — ED Notes (Signed)
Pt reports constant upper abdominal pain since Friday. States feel like indigestion. Denies N/V/D

## 2016-01-21 NOTE — ED Notes (Signed)
Pt also reports that he has been coughing up yellow mucous for 3 days as well. Reports increase SOB with exertion

## 2016-01-22 ENCOUNTER — Inpatient Hospital Stay (HOSPITAL_COMMUNITY)
Admission: EM | Admit: 2016-01-22 | Discharge: 2016-01-25 | DRG: 871 | Disposition: A | Discharge status: Home Health | Payer: Medicare Other | Attending: Internal Medicine | Admitting: Internal Medicine

## 2016-01-22 ENCOUNTER — Encounter (HOSPITAL_COMMUNITY): Payer: Self-pay | Admitting: Internal Medicine

## 2016-01-22 DIAGNOSIS — K219 Gastro-esophageal reflux disease without esophagitis: Secondary | ICD-10-CM | POA: Diagnosis present

## 2016-01-22 DIAGNOSIS — J101 Influenza due to other identified influenza virus with other respiratory manifestations: Secondary | ICD-10-CM | POA: Diagnosis present

## 2016-01-22 DIAGNOSIS — J189 Pneumonia, unspecified organism: Secondary | ICD-10-CM | POA: Diagnosis not present

## 2016-01-22 DIAGNOSIS — J9621 Acute and chronic respiratory failure with hypoxia: Secondary | ICD-10-CM | POA: Diagnosis present

## 2016-01-22 DIAGNOSIS — J9690 Respiratory failure, unspecified, unspecified whether with hypoxia or hypercapnia: Secondary | ICD-10-CM | POA: Diagnosis present

## 2016-01-22 DIAGNOSIS — G4733 Obstructive sleep apnea (adult) (pediatric): Secondary | ICD-10-CM | POA: Diagnosis present

## 2016-01-22 DIAGNOSIS — Z66 Do not resuscitate: Secondary | ICD-10-CM | POA: Diagnosis present

## 2016-01-22 DIAGNOSIS — J440 Chronic obstructive pulmonary disease with acute lower respiratory infection: Secondary | ICD-10-CM | POA: Diagnosis present

## 2016-01-22 DIAGNOSIS — E538 Deficiency of other specified B group vitamins: Secondary | ICD-10-CM | POA: Diagnosis present

## 2016-01-22 DIAGNOSIS — E860 Dehydration: Secondary | ICD-10-CM | POA: Diagnosis present

## 2016-01-22 DIAGNOSIS — I10 Essential (primary) hypertension: Secondary | ICD-10-CM | POA: Diagnosis not present

## 2016-01-22 DIAGNOSIS — J441 Chronic obstructive pulmonary disease with (acute) exacerbation: Secondary | ICD-10-CM

## 2016-01-22 DIAGNOSIS — Z801 Family history of malignant neoplasm of trachea, bronchus and lung: Secondary | ICD-10-CM | POA: Diagnosis not present

## 2016-01-22 DIAGNOSIS — R0602 Shortness of breath: Secondary | ICD-10-CM | POA: Diagnosis not present

## 2016-01-22 DIAGNOSIS — E1165 Type 2 diabetes mellitus with hyperglycemia: Secondary | ICD-10-CM | POA: Diagnosis present

## 2016-01-22 DIAGNOSIS — A419 Sepsis, unspecified organism: Secondary | ICD-10-CM | POA: Diagnosis present

## 2016-01-22 DIAGNOSIS — Z880 Allergy status to penicillin: Secondary | ICD-10-CM | POA: Diagnosis not present

## 2016-01-22 DIAGNOSIS — R6510 Systemic inflammatory response syndrome (SIRS) of non-infectious origin without acute organ dysfunction: Secondary | ICD-10-CM

## 2016-01-22 DIAGNOSIS — R739 Hyperglycemia, unspecified: Secondary | ICD-10-CM

## 2016-01-22 DIAGNOSIS — D72829 Elevated white blood cell count, unspecified: Secondary | ICD-10-CM

## 2016-01-22 DIAGNOSIS — Z23 Encounter for immunization: Secondary | ICD-10-CM | POA: Diagnosis not present

## 2016-01-22 DIAGNOSIS — Z87891 Personal history of nicotine dependence: Secondary | ICD-10-CM | POA: Diagnosis not present

## 2016-01-22 DIAGNOSIS — Z7951 Long term (current) use of inhaled steroids: Secondary | ICD-10-CM | POA: Diagnosis not present

## 2016-01-22 DIAGNOSIS — J1000 Influenza due to other identified influenza virus with unspecified type of pneumonia: Secondary | ICD-10-CM | POA: Diagnosis present

## 2016-01-22 DIAGNOSIS — Z85828 Personal history of other malignant neoplasm of skin: Secondary | ICD-10-CM | POA: Diagnosis not present

## 2016-01-22 DIAGNOSIS — Z9981 Dependence on supplemental oxygen: Secondary | ICD-10-CM | POA: Diagnosis not present

## 2016-01-22 LAB — INFLUENZA PANEL BY PCR (TYPE A & B)
H1N1 flu by pcr: DETECTED — AB
Influenza A By PCR: POSITIVE — AB
Influenza B By PCR: NEGATIVE

## 2016-01-22 LAB — CBC WITH DIFFERENTIAL/PLATELET
BASOS PCT: 0 %
Basophils Absolute: 0.1 10*3/uL (ref 0.0–0.1)
Eosinophils Absolute: 0 10*3/uL (ref 0.0–0.7)
Eosinophils Relative: 0 %
HCT: 48.2 % (ref 39.0–52.0)
Hemoglobin: 16.6 g/dL (ref 13.0–17.0)
LYMPHS ABS: 1.8 10*3/uL (ref 0.7–4.0)
Lymphocytes Relative: 6 %
MCH: 31.4 pg (ref 26.0–34.0)
MCHC: 34.4 g/dL (ref 30.0–36.0)
MCV: 91.3 fL (ref 78.0–100.0)
Monocytes Absolute: 1.3 10*3/uL — ABNORMAL HIGH (ref 0.1–1.0)
Monocytes Relative: 4 %
Neutro Abs: 27.4 10*3/uL — ABNORMAL HIGH (ref 1.7–7.7)
Neutrophils Relative %: 90 %
Platelets: 288 10*3/uL (ref 150–400)
RBC: 5.28 MIL/uL (ref 4.22–5.81)
RDW: 13.3 % (ref 11.5–15.5)
WBC: 30.5 10*3/uL — ABNORMAL HIGH (ref 4.0–10.5)

## 2016-01-22 LAB — URINE MICROSCOPIC-ADD ON
SQUAMOUS EPITHELIAL / LPF: NONE SEEN
WBC, UA: NONE SEEN WBC/hpf (ref 0–5)

## 2016-01-22 LAB — COMPREHENSIVE METABOLIC PANEL
ALT: 40 U/L (ref 17–63)
ANION GAP: 11 (ref 5–15)
AST: 29 U/L (ref 15–41)
Albumin: 3.9 g/dL (ref 3.5–5.0)
Alkaline Phosphatase: 58 U/L (ref 38–126)
BILIRUBIN TOTAL: 0.9 mg/dL (ref 0.3–1.2)
BUN: 21 mg/dL — ABNORMAL HIGH (ref 6–20)
CHLORIDE: 99 mmol/L — AB (ref 101–111)
CO2: 29 mmol/L (ref 22–32)
CREATININE: 1.12 mg/dL (ref 0.61–1.24)
Calcium: 9.8 mg/dL (ref 8.9–10.3)
GFR calc Af Amer: >60 mL/min (ref >60)
GFR calc non Af Amer: >60 mL/min (ref >60)
Glucose, Bld: 216 mg/dL — ABNORMAL HIGH (ref 65–99)
Potassium: 4.4 mmol/L (ref 3.5–5.1)
SODIUM: 139 mmol/L (ref 135–145)
Total Protein: 7.4 g/dL (ref 6.5–8.1)

## 2016-01-22 LAB — BRAIN NATRIURETIC PEPTIDE: B NATRIURETIC PEPTIDE 5: 64 pg/mL (ref 0.0–100.0)

## 2016-01-22 LAB — URINALYSIS, ROUTINE W REFLEX MICROSCOPIC
Bilirubin Urine: NEGATIVE
Glucose, UA: >1000 mg/dL — AB
Hgb urine dipstick: NEGATIVE
KETONES UR: NEGATIVE mg/dL
Leukocytes, UA: NEGATIVE
NITRITE: NEGATIVE
Protein, ur: NEGATIVE mg/dL
Specific Gravity, Urine: 1.025 (ref 1.005–1.030)
pH: 5 (ref 5.0–8.0)

## 2016-01-22 LAB — LACTIC ACID, PLASMA: LACTIC ACID, VENOUS: 4.5 mmol/L — AB (ref 0.5–2.0)

## 2016-01-22 LAB — TROPONIN I

## 2016-01-22 LAB — LIPASE, BLOOD: Lipase: 17 U/L (ref 11–51)

## 2016-01-22 LAB — STREP PNEUMONIAE URINARY ANTIGEN: Strep Pneumo Urinary Antigen: NEGATIVE

## 2016-01-22 LAB — TSH: TSH: 3.226 u[IU]/mL (ref 0.350–4.500)

## 2016-01-22 LAB — MAGNESIUM: Magnesium: 2.1 mg/dL (ref 1.7–2.4)

## 2016-01-22 LAB — GLUCOSE, CAPILLARY: GLUCOSE-CAPILLARY: 189 mg/dL — AB (ref 65–99)

## 2016-01-22 LAB — MRSA PCR SCREENING: MRSA BY PCR: NEGATIVE

## 2016-01-22 LAB — PHOSPHORUS: PHOSPHORUS: 3.1 mg/dL (ref 2.5–4.6)

## 2016-01-22 LAB — PROCALCITONIN: PROCALCITONIN: 0.17 ng/mL

## 2016-01-22 MED ORDER — OSELTAMIVIR PHOSPHATE 75 MG PO CAPS: 75.0000 mg | ORAL_CAPSULE | Freq: Two times a day (BID) | ORAL | Status: DC

## 2016-01-22 MED ORDER — METHYLPREDNISOLONE SODIUM SUCC 125 MG IJ SOLR
125.0000 mg | Freq: Once | INTRAMUSCULAR | Status: AC
  Administered 2016-01-22: 125 mg via INTRAVENOUS
  Filled 2016-01-22: qty 2

## 2016-01-22 MED ORDER — LEVOFLOXACIN IN D5W 750 MG/150ML IV SOLN: 750.0000 mg | INTRAVENOUS | Status: DC

## 2016-01-22 MED ORDER — DEXTROMETHORPHAN POLISTIREX ER 30 MG/5ML PO SUER
30.0000 mg | Freq: Two times a day (BID) | ORAL | Status: DC | PRN
  Administered 2016-01-22: 30 mg via ORAL
  Filled 2016-01-22: qty 5

## 2016-01-22 MED ORDER — VITAMIN B-12 1000 MCG PO TABS
2000.0000 ug | ORAL_TABLET | Freq: Every day | ORAL | Status: DC
  Administered 2016-01-22 – 2016-01-25 (×4): 2000 ug via ORAL
  Filled 2016-01-22 (×4): qty 2

## 2016-01-22 MED ORDER — ENOXAPARIN SODIUM 40 MG/0.4ML ~~LOC~~ SOLN
40.0000 mg | SUBCUTANEOUS | Status: DC
  Administered 2016-01-22 – 2016-01-24 (×3): 40 mg via SUBCUTANEOUS
  Filled 2016-01-22 (×3): qty 0.4

## 2016-01-22 MED ORDER — METHYLPREDNISOLONE SODIUM SUCC 125 MG IJ SOLR
80.0000 mg | Freq: Three times a day (TID) | INTRAMUSCULAR | Status: DC
  Administered 2016-01-22 – 2016-01-24 (×6): 80 mg via INTRAVENOUS
  Filled 2016-01-22 (×6): qty 2

## 2016-01-22 MED ORDER — SODIUM CHLORIDE 0.9 % IV SOLN
INTRAVENOUS | Status: DC
  Administered 2016-01-22 – 2016-01-24 (×4): via INTRAVENOUS

## 2016-01-22 MED ORDER — PANTOPRAZOLE SODIUM 40 MG PO TBEC
40.0000 mg | DELAYED_RELEASE_TABLET | Freq: Every day | ORAL | Status: DC
  Administered 2016-01-22 – 2016-01-25 (×4): 40 mg via ORAL
  Filled 2016-01-22 (×5): qty 1

## 2016-01-22 MED ORDER — ENOXAPARIN SODIUM 40 MG/0.4ML ~~LOC~~ SOLN
40.0000 mg | SUBCUTANEOUS | Status: DC
  Filled 2016-01-22: qty 0.4

## 2016-01-22 MED ORDER — BUDESONIDE 0.25 MG/2ML IN SUSP
0.2500 mg | Freq: Two times a day (BID) | RESPIRATORY_TRACT | Status: DC
  Administered 2016-01-22 – 2016-01-25 (×6): 0.25 mg via RESPIRATORY_TRACT
  Filled 2016-01-22 (×6): qty 2

## 2016-01-22 MED ORDER — ADULT MULTIVITAMIN W/MINERALS CH
1.0000 | ORAL_TABLET | Freq: Every day | ORAL | Status: DC
  Administered 2016-01-23 – 2016-01-25 (×3): 1 via ORAL
  Filled 2016-01-22 (×5): qty 1

## 2016-01-22 MED ORDER — GUAIFENESIN ER 600 MG PO TB12
600.0000 mg | ORAL_TABLET | Freq: Two times a day (BID) | ORAL | Status: DC
  Administered 2016-01-22 – 2016-01-25 (×7): 600 mg via ORAL
  Filled 2016-01-22 (×9): qty 1

## 2016-01-22 MED ORDER — IPRATROPIUM BROMIDE 0.02 % IN SOLN
0.5000 mg | Freq: Once | RESPIRATORY_TRACT | Status: AC
  Administered 2016-01-22: 0.5 mg via RESPIRATORY_TRACT
  Filled 2016-01-22: qty 2.5

## 2016-01-22 MED ORDER — LEVOFLOXACIN IN D5W 750 MG/150ML IV SOLN
750.0000 mg | Freq: Once | INTRAVENOUS | Status: AC
  Administered 2016-01-22: 750 mg via INTRAVENOUS
  Filled 2016-01-22: qty 150

## 2016-01-22 MED ORDER — OSELTAMIVIR PHOSPHATE 75 MG PO CAPS
75.0000 mg | ORAL_CAPSULE | Freq: Two times a day (BID) | ORAL | Status: DC
  Administered 2016-01-22: 75 mg via ORAL

## 2016-01-22 MED ORDER — PNEUMOCOCCAL VAC POLYVALENT 25 MCG/0.5ML IJ INJ
0.5000 mL | INJECTION | INTRAMUSCULAR | Status: AC
  Administered 2016-01-23: 0.5 mL via INTRAMUSCULAR
  Filled 2016-01-22: qty 0.5

## 2016-01-22 MED ORDER — LOSARTAN POTASSIUM 50 MG PO TABS
50.0000 mg | ORAL_TABLET | Freq: Every day | ORAL | Status: DC
  Administered 2016-01-22 – 2016-01-25 (×4): 50 mg via ORAL
  Filled 2016-01-22 (×4): qty 1

## 2016-01-22 MED ORDER — INSULIN DETEMIR 100 UNIT/ML ~~LOC~~ SOLN
5.0000 [IU] | Freq: Every day | SUBCUTANEOUS | Status: DC
  Administered 2016-01-22 – 2016-01-25 (×4): 5 [IU] via SUBCUTANEOUS
  Filled 2016-01-22 (×7): qty 0.05

## 2016-01-22 MED ORDER — HYDROCODONE-ACETAMINOPHEN 5-325 MG PO TABS: 1.0000 | ORAL_TABLET | Freq: Four times a day (QID) | ORAL | Status: DC | PRN

## 2016-01-22 MED ORDER — OSELTAMIVIR PHOSPHATE 30 MG PO CAPS
30.0000 mg | ORAL_CAPSULE | Freq: Two times a day (BID) | ORAL | Status: DC
  Administered 2016-01-22 – 2016-01-24 (×4): 30 mg via ORAL
  Filled 2016-01-22 (×9): qty 1

## 2016-01-22 MED ORDER — ACETAMINOPHEN 325 MG PO TABS
650.0000 mg | ORAL_TABLET | Freq: Four times a day (QID) | ORAL | Status: DC | PRN
  Administered 2016-01-22 (×2): 650 mg via ORAL
  Filled 2016-01-22 (×2): qty 2

## 2016-01-22 MED ORDER — INFLUENZA VAC SPLIT QUAD 0.5 ML IM SUSY
0.5000 mL | PREFILLED_SYRINGE | INTRAMUSCULAR | Status: AC
  Administered 2016-01-23: 0.5 mL via INTRAMUSCULAR
  Filled 2016-01-22: qty 0.5

## 2016-01-22 MED ORDER — UMECLIDINIUM BROMIDE 62.5 MCG/INH IN AEPB
1.0000 | INHALATION_SPRAY | Freq: Every day | RESPIRATORY_TRACT | Status: DC
  Administered 2016-01-24 – 2016-01-25 (×2): 1 via RESPIRATORY_TRACT
  Filled 2016-01-22: qty 7

## 2016-01-22 MED ORDER — ALBUTEROL SULFATE (2.5 MG/3ML) 0.083% IN NEBU
5.0000 mg | INHALATION_SOLUTION | RESPIRATORY_TRACT | Status: AC | PRN
  Administered 2016-01-22: 5 mg via RESPIRATORY_TRACT
  Filled 2016-01-22: qty 6

## 2016-01-22 MED ORDER — ALBUTEROL (5 MG/ML) CONTINUOUS INHALATION SOLN
15.0000 mg/h | INHALATION_SOLUTION | Freq: Once | RESPIRATORY_TRACT | Status: AC
  Administered 2016-01-22: 15 mg/h via RESPIRATORY_TRACT
  Filled 2016-01-22: qty 20

## 2016-01-22 MED ORDER — GUAIFENESIN-DM 100-10 MG/5ML PO SYRP: 5.0000 mL | ORAL_SOLUTION | ORAL | Status: DC | PRN

## 2016-01-22 MED ORDER — INSULIN ASPART 100 UNIT/ML ~~LOC~~ SOLN
0.0000 [IU] | Freq: Three times a day (TID) | SUBCUTANEOUS | Status: DC
  Administered 2016-01-22 – 2016-01-23 (×2): 3 [IU] via SUBCUTANEOUS
  Administered 2016-01-23: 2 [IU] via SUBCUTANEOUS
  Administered 2016-01-23: 3 [IU] via SUBCUTANEOUS
  Administered 2016-01-24: 5 [IU] via SUBCUTANEOUS
  Administered 2016-01-24: 3 [IU] via SUBCUTANEOUS
  Administered 2016-01-24: 5 [IU] via SUBCUTANEOUS
  Administered 2016-01-25: 3 [IU] via SUBCUTANEOUS

## 2016-01-22 NOTE — Progress Notes (Signed)
[facility] PROGRESS NOTE  Michael Evans [id number] DOB: Feb 24, 1941 DOA: 01/22/2016 PCP: Odette Fraction, MD  Assessment/Plan: 1. Acute on chronic hypoxemic respiratory failure -Evidence by an O2 sat of 85% on presentation, having an increase oxygen requirement where at baseline he uses 2 L of supplemental oxygen now on 6 L. He appears to be in respiratory distress on my evaluation having respiratory rates in the low 30s. -This is likely secondary to Influenza with possible superimposed pneumonia which has triggered COPD exacerbation. -Starting empiric IV antibiotic therapy with Levaquin along with Tamiflu. Will treat with systemic steroids and nebulizer treatments. -Michael Evans will be admitted to the step down unit for close monitoring.  2.  Influenza -Unfortunately Michael Evans did not get his flu vaccine last year, reported having sick contacts with several family members recently diagnosed with the flu. -Lab work positive for influenza A -Will start Tamiflu 75 mg by mouth twice a day  3.  Sepsis -Present on admission, evidenced by a white count of 30,500, heart rate of 121, respiratory rate of 29 -Likely secondary to Influenza with superimposed pneumonia -Has mentioned above starting empiric IV antibiotic therapy with Levaquin as well as Tamiflu -Admitting to step down unit  4.  Chronic obstructive pulmonary disease exacerbation. -Patient presenting with acute hypoxemic respiratory failure. He has a history of COPD and chronic hypoxemic respiratory failure requiring 2 L of mental oxygen at baseline. -He has diminished breath sounds bilaterally with presence of expiratory wheezes. -COPD exacerbation likely precipitated by infectious process -Continue systemic steroids with Solu-Medrol 80 mg IV every 8 hours as well as scheduled duo nebs  5.  History of diabetes mellitus. -Continue Levemir 5 units subcutaneous daily with sliding scale coverage  Code Status: Discussed CODE  STATUS with patient he is a DO NOT RESUSCITATE Family Communication:  Disposition Plan: Admit to step down unit  Antibiotics:  Levaquin  Tamiflu  HPI/Subjective: Michael Evans is a pleasant 75 year old gentleman with a past medical history of chronic obstructive pulmonary disease, chronic hypoxemic respiratory failure requiring 2 L of oxygen at baseline, presenting to the emergency department with complaints of cough associate with shortness of breath, generalized weakness, malaise, feeling ill. Flu swab coming back positive for influenza A.   Objective: Filed Vitals:   01/22/16 0700 01/22/16 0730  BP: 123/62 138/72  Pulse: 97 103  Temp:    Resp: 23 24   No intake or output data in the 24 hours ending 01/22/16 0848 Filed Weights   01/21/16 2254  Weight: 68.947 kg (152 lb)    Exam:   General:  Appears dyspneic at rest, ill-appearing, getting short of breath particularly with speaking.  Cardiovascular: Tachycardic regular rate rhythm normal S1-S2  Respiratory: Severely diminished breath tones bilaterally with extra tori wheezes, prolonged expiratory phase  Abdomen: Soft nontender nondistended  Musculoskeletal: No edema  Data Reviewed: Basic Metabolic Panel:  Recent Labs Lab 01/22/16 0012  NA 139  K 4.4  CL 99*  CO2 29  GLUCOSE 216*  BUN 21*  CREATININE 1.12  CALCIUM 9.8   Liver Function Tests:  Recent Labs Lab 01/22/16 0012  AST 29  ALT 40  ALKPHOS 58  BILITOT 0.9  PROT 7.4  ALBUMIN 3.9    Recent Labs Lab 01/22/16 0012  LIPASE 17   No results for input(s): AMMONIA in the last 168 hours. CBC:  Recent Labs Lab 01/22/16 0012  WBC 30.5*  NEUTROABS 27.4*  HGB 16.6  HCT 48.2  MCV 91.3  PLT 288  Cardiac Enzymes:  Recent Labs Lab 01/22/16 0012  TROPONINI <0.03   BNP (last 3 results)  Recent Labs  01/22/16 0012  BNP 64.0    ProBNP (last 3 results) No results for input(s): PROBNP in the last 8760 hours.  CBG: No results for  input(s): GLUCAP in the last 168 hours.  No results found for this or any previous visit (from the past 240 hour(s)).   Studies: Dg Chest 2 View  01/21/2016  CLINICAL DATA:  Acute onset of productive cough, worsening shortness of breath and lower chest pain. Indigestion. Initial encounter. EXAM: CHEST  2 VIEW COMPARISON:  Chest radiograph performed 03/14/2015, and CT of the chest performed 04/05/2015 FINDINGS: The lungs are well-aerated. Peribronchial thickening is noted. Mild bilateral central airspace opacities could reflect mild pneumonia, given the patient's symptoms. No pleural effusion or pneumothorax is seen. A right-sided nipple shadow is noted. The heart is normal in size; the mediastinal contour is within normal limits. No acute osseous abnormalities are seen. IMPRESSION: Peribronchial thickening noted. Mild bilateral central airspace opacities could reflect mild pneumonia, given the patient's symptoms. Electronically Signed   By: Garald Balding M.D.   On: 01/21/2016 23:35    Scheduled Meds: . guaiFENesin  600 mg Oral BID   Continuous Infusions:   Principal Problem:   Acute on chronic respiratory failure with hypoxia (HCC) Active Problems:   Essential hypertension, benign   OSA (obstructive sleep apnea)   Community acquired pneumonia   COPD with exacerbation ([place])   SIRS (systemic inflammatory response syndrome) (HCC)   Leukocytosis   Hyperglycemia   Respiratory failure ([place])    Time spent:     Kelvin Cellar  [facility] Pager [phone]. If 7PM-7AM, please contact night-coverage at [email or web address], password [facility] 01/22/2016, 8:48 AM  LOS: 0 days

## 2016-01-22 NOTE — Progress Notes (Signed)
Pharmacy Antibiotic Note  Michael Evans is a 75 y.o. male admitted on 01/22/2016 with pneumonia. And influenza Pharmacy has been consulted for renal adjust dosing.  Plan: Levaquin 750 mg IV q48 hours Change tamiflu to 30 mg po bid  Weight: 152 lb (68.947 kg)  Temp (24hrs), Avg:99.7 F (37.6 C), Min:99.4 F (37.4 C), Max:99.9 F (37.7 C)   Recent Labs Lab 01/22/16 0012  WBC 30.5*  CREATININE 1.12    Estimated Creatinine Clearance: 48 mL/min (by C-G formula based on Cr of 1.12).    Allergies  Allergen Reactions  . Aspirin Other (See Comments)    Upset stomach.  . Penicillins Hives    Has patient had a PCN reaction causing immediate rash, facial/tongue/throat swelling, SOB or lightheadedness with hypotension: Yes Has patient had a PCN reaction causing severe rash involving mucus membranes or skin necrosis: No Has patient had a PCN reaction that required hospitalization No Has patient had a PCN reaction occurring within the last 10 years: No If all of the above answers are "NO", then may proceed with Cephalosporin use.   . Sulfa Antibiotics Hives    Antimicrobials this admission: Levaquin  2/21  >>  Tamiflu  2/21 >> 2/26  Thank you for allowing pharmacy to be a part of this patient's care.  Excell Seltzer Poteet 01/22/2016 11:30 AM

## 2016-01-22 NOTE — ED Provider Notes (Addendum)
CSN: [id number]     Arrival date & time 01/21/16  2226 History  By signing my name below, I, Michael Evans, attest that this documentation has been prepared under the direction and in the presence of Michael Norrie, MD at [phone]. Electronically Signed: Doran Evans, ED Scribe. 01/22/2016. 12:38 AM.   Chief Complaint  Patient presents with  . Abdominal Pain  . Cough   The history is provided by the patient. No language interpreter was used.   HPI Comments: Michael Evans is a 75 y.o. male with a PMHx COPD and HTN who presents to the Emergency Department complaining of constant, achy worsening lower abdominal pain that began 4 days ago. He initially told me it was lower abdominal pain however then he showed me it was upper abdominal pain. He can only describe it as "painful". He then states it sort of achy and a pressure feeling. He denies nausea, vomiting, fever, diarrhea, or constipation. He denies feeling bloated. He states he's never had this pain before. He states he is status post appendectomy. He also reports he's been having worsening of his cough and now is coughing up white and yellow sputum. He was seen by his PCP last week and started on steroids because he was getting short of breath however he reports it is not helping. He states he has been using his nebulizer 3 times a day and it's not helping with his breathing. Patient is on home oxygen at 2 L/m nasal cannula.  Followed by Michael Fraction, MD  Past Medical History  Diagnosis Date  . COPD (chronic obstructive pulmonary disease) (HCC)     PFT 06-26-09 FEV1  1.75( 71%), FVC 3.65( 101%), FEV1% 48, TLC 5.53(104%), DLCO 55%, no BD  . Arthritis   . Hypertension   . Nasal septal deviation   . Chronic rhinitis   . OSA (obstructive sleep apnea) 04/21/2011  . Nocturia   . Skin cancer    Past Surgical History  Procedure Laterality Date  . Back surgery    . Appendectomy    . Cataract extraction w/phaco  01/06/2013    Procedure:  CATARACT EXTRACTION PHACO AND INTRAOCULAR LENS PLACEMENT (IOC);  Surgeon: Tonny Branch, MD;  Location: [facility];  Service: Ophthalmology;  Laterality: Right;  CDE: 22.17   Family History  Problem Relation Age of Onset  . Lung cancer Mother    Social History  Substance Use Topics  . Smoking status: Former Smoker -- 1.50 packs/day for 54 years    Types: Cigarettes    Quit date: 12/01/2001  . Smokeless tobacco: Never Used  . Alcohol Use: No   was living alone however now he is living with his sister.  Review of Systems  Constitutional: Negative for fever and chills.  HENT: Negative for congestion and facial swelling.   Eyes: Negative for discharge and visual disturbance.  Respiratory: Positive for cough. Negative for shortness of breath.   Cardiovascular: Negative for chest pain and palpitations.  Gastrointestinal: Positive for abdominal pain. Negative for vomiting and diarrhea.  Musculoskeletal: Negative for myalgias and arthralgias.  Skin: Negative for color change and rash.  Neurological: Negative for tremors, syncope and headaches.  Psychiatric/Behavioral: Negative for confusion and dysphoric mood.   Allergies  Aspirin; Penicillins; and Sulfa antibiotics  Home Medications   Prior to Admission medications   Medication Sig Start Date End Date Taking? Authorizing Provider  albuterol (PROVENTIL HFA;VENTOLIN HFA) 108 (90 BASE) MCG/ACT inhaler Inhale 1-2 puffs into the lungs every 6 (six)  hours as needed for wheezing or shortness of breath. 05/29/15   Chesley Mires, MD  albuterol (PROVENTIL) (2.5 MG/3ML) 0.083% nebulizer solution Take 3 mLs (2.5 mg total) by nebulization every 4 (four) hours as needed for wheezing or shortness of breath. 10/02/15   [name], MD  budesonide-formoterol [facility]) 160-4.5 MCG/ACT inhaler Inhale 2 puffs into the lungs 2 (two) times daily. 06/06/15   Chesley Mires, MD  cyanocobalamin 2000 MCG tablet Take 2,000 mcg by mouth daily.    Historical Provider, MD   diclofenac (VOLTAREN) 75 MG EC tablet Take 1 tablet (75 mg total) by mouth 2 (two) times daily. 11/13/15   [name], MD  losartan (COZAAR) 50 MG tablet Take 1 tablet (50 mg total) by mouth daily. 12/31/15   [name], MD  Multiple Vitamin (MULTIVITAMIN) tablet Take 1 tablet by mouth daily.    Historical Provider, MD  predniSONE (DELTASONE) 20 MG tablet Take 1 tablet (20 mg total) by mouth daily with breakfast. Patient not taking: Reported on 01/14/2016 12/31/15   [name], MD  predniSONE (DELTASONE) 20 MG tablet Take 3 tablets (60 mg total) by mouth daily with breakfast. 01/14/16   [name], MD  Umeclidinium Bromide (INCRUSE ELLIPTA) 62.5 MCG/INH AEPB Inhale 1 Inhaler into the lungs daily.    Historical Provider, MD   ED Triage Vitals  Enc Vitals Group     BP 01/21/16 2254 158/90 mmHg     Pulse Rate 01/21/16 2254 93     Resp 01/21/16 2254 28     Temp 01/21/16 2254 99.4 F (37.4 C)     Temp Source 01/21/16 2254 Oral     SpO2 01/21/16 2254 90 %     Weight 01/21/16 2254 152 lb (68.947 kg)     Height --      Head Cir --      Peak Flow --      Pain Score --      Pain Loc --      Pain Edu? --      Excl. in [place]? --    Vital signs normal except for hypertension, low-grade fever, borderline hypoxia    Physical Exam  Constitutional: He is oriented to person, place, and time. He appears well-developed and well-nourished.  Non-toxic appearance. He does not appear ill. No distress.  HENT:  Head: Normocephalic and atraumatic.  Right Ear: External ear normal.  Left Ear: External ear normal.  Nose: Nose normal. No mucosal edema or rhinorrhea.  Mouth/Throat: Oropharynx is clear and moist and mucous membranes are normal. No dental abscesses or uvula swelling.  Eyes: Conjunctivae and EOM are normal. Pupils are equal, round, and reactive to light.  Neck: Normal range of motion and full passive range of motion without pain. Neck supple.  Cardiovascular: Normal rate,  regular rhythm and normal heart sounds.  Exam reveals no gallop and no friction rub.   No murmur heard. Pulmonary/Chest: Accessory muscle usage present. Tachypnea noted. He is in respiratory distress. He has decreased breath sounds. He has no wheezes. He has no rhonchi. He has no rales. He exhibits no tenderness and no crepitus.  Coughing frequently  Abdominal: Soft. Normal appearance and bowel sounds are normal. He exhibits distension. There is no rebound and no guarding.  Tender diffusely left and right, mid and lower abdomen  Musculoskeletal: Normal range of motion. He exhibits no edema or tenderness.  Moves all extremities well.   Neurological: He is alert and oriented to  person, place, and time. He has normal strength. No cranial nerve deficit.  Skin: Skin is warm, dry and intact. No rash noted. No erythema. No pallor.  Psychiatric: He has a normal mood and affect. His speech is normal and behavior is normal. His mood appears not anxious.  Nursing note and vitals reviewed.   ED Course  Procedures   Medications  albuterol (PROVENTIL,VENTOLIN) solution continuous neb (15 mg/hr Nebulization Given 01/22/16 0113)  ipratropium (ATROVENT) nebulizer solution 0.5 mg (0.5 mg Nebulization Given 01/22/16 0114)  methylPREDNISolone sodium succinate (SOLU-MEDROL) 125 mg/2 mL injection 125 mg (125 mg Intravenous Given 01/22/16 0100)  levofloxacin (LEVAQUIN) IVPB 750 mg (0 mg Intravenous Stopped 01/22/16 0230)     DIAGNOSTIC STUDIES: Oxygen Saturation is 97% on room air, normal by my interpretation.    COORDINATION OF CARE: 12:38 AM Will order CXR, blood work, and EKG. Discussed treatment plan with pt at bedside and pt agreed to plan.  Patient was given a continuous nebulizer and given a dose of IV steroids.  After reviewing his chest x-ray he was started on Levaquin IV for his possible pneumonia. He has a penicillin allergy and I do not see where he has ever been on Rocephin. He has been on  Levaquin frequently for pulmonary infection. He has not had a admission to the hospital in the past 3 months  Recheck at 02:15 Pt is almost done with his continuous nebulizer, now has some improved air flow and has diffuse wheezing now. He states his abdominal pain is improving as his breathing is improving. I am beginning to believe his abdominal pain may be musculoskeletal in origin from his frequent coughing and his struggling to breathe. His pulse ox on oxygen is 91% on the continuous nebulizer when I was in the room. Discussed he will need to be admitted and he is agreeable.    03:15 Dr Dyann Kief, Hospitalist, admit to tele   Labs Review Results for orders placed or performed during the hospital encounter of 01/22/16  Comprehensive metabolic panel  Result Value Ref Range   Sodium 139 135 - 145 mmol/L   Potassium 4.4 3.5 - 5.1 mmol/L   Chloride 99 (L) 101 - 111 mmol/L   CO2 29 22 - 32 mmol/L   Glucose, Bld 216 (H) 65 - 99 mg/dL   BUN 21 (H) 6 - 20 mg/dL   Creatinine, Ser 1.12 0.61 - 1.24 mg/dL   Calcium 9.8 8.9 - 10.3 mg/dL   Total Protein 7.4 6.5 - 8.1 g/dL   Albumin 3.9 3.5 - 5.0 g/dL   AST 29 15 - 41 U/L   ALT 40 17 - 63 U/L   Alkaline Phosphatase 58 38 - 126 U/L   Total Bilirubin 0.9 0.3 - 1.2 mg/dL   GFR calc non Af Amer >60 >60 mL/min   GFR calc Af Amer >60 >60 mL/min   Anion gap 11 5 - 15  CBC WITH DIFFERENTIAL  Result Value Ref Range   WBC 30.5 (H) 4.0 - 10.5 K/uL   RBC 5.28 4.22 - 5.81 MIL/uL   Hemoglobin 16.6 13.0 - 17.0 g/dL   HCT 48.2 39.0 - 52.0 %   MCV 91.3 78.0 - 100.0 fL   MCH 31.4 26.0 - 34.0 pg   MCHC 34.4 30.0 - 36.0 g/dL   RDW 13.3 11.5 - 15.5 %   Platelets 288 150 - 400 K/uL   Neutrophils Relative % 90 %   Neutro Abs 27.4 (H) 1.7 - 7.7 K/uL  Lymphocytes Relative 6 %   Lymphs Abs 1.8 0.7 - 4.0 K/uL   Monocytes Relative 4 %   Monocytes Absolute 1.3 (H) 0.1 - 1.0 K/uL   Eosinophils Relative 0 %   Eosinophils Absolute 0.0 0.0 - 0.7 K/uL   Basophils  Relative 0 %   Basophils Absolute 0.1 0.0 - 0.1 K/uL   WBC Morphology ATYPICAL LYMPHOCYTES   Troponin I  Result Value Ref Range   Troponin I <0.03 <0.031 ng/mL  Brain natriuretic peptide  Result Value Ref Range   B Natriuretic Peptide 64.0 0.0 - 100.0 pg/mL  Lipase, blood  Result Value Ref Range   Lipase 17 11 - 51 U/L    Laboratory interpretation all normal except leukocytosis (very elevated possibly related to the steroids he has been on for the past week plus the pneumonia seen on his x-ray), hyperglycemia, elevated BUN consistent with dehydration    Imaging Review Dg Chest 2 View  01/21/2016  CLINICAL DATA:  Acute onset of productive cough, worsening shortness of breath and lower chest pain. Indigestion. Initial encounter. EXAM: CHEST  2 VIEW COMPARISON:  Chest radiograph performed 03/14/2015, and CT of the chest performed 04/05/2015 FINDINGS: The lungs are well-aerated. Peribronchial thickening is noted. Mild bilateral central airspace opacities could reflect mild pneumonia, given the patient's symptoms. No pleural effusion or pneumothorax is seen. A right-sided nipple shadow is noted. The heart is normal in size; the mediastinal contour is within normal limits. No acute osseous abnormalities are seen. IMPRESSION: Peribronchial thickening noted. Mild bilateral central airspace opacities could reflect mild pneumonia, given the patient's symptoms. Electronically Signed   By: Garald Balding M.D.   On: 01/21/2016 23:35   I have personally reviewed and evaluated these images and lab results as part of my medical decision-making.   EKG Interpretation   Date/Time:  Tuesday January 22 2016 00:55:10 EST Ventricular Rate:  108 PR Interval:  123 QRS Duration: 83 QT Interval:  289 QTC Calculation: 387 R Axis:   -77 Text Interpretation:  Sinus tachycardia Left anterior fascicular block  Abnormal R-wave progression, late transition No significant change since  last tracing 13 Apr 2015  Confirmed by Jolyn Deshmukh  MD-I, Noble Cicalese ([id number]) on  01/22/2016 1:01:20 AM      MDM   Final diagnoses:  COPD with exacerbation ([place])  CAP (community acquired pneumonia)    Plan admission  Rolland Porter, MD, [place] Performed by: Rolland Porter L Total critical care time: 35 minutes Critical care time was exclusive of separately billable procedures and treating other patients. Critical care was necessary to treat or prevent imminent or life-threatening deterioration. Critical care was time spent personally by me on the following activities: development of treatment plan with patient and/or surrogate as well as nursing, discussions with consultants, evaluation of patient's response to treatment, examination of patient, obtaining history from patient or surrogate, ordering and performing treatments and interventions, ordering and review of laboratory studies, ordering and review of radiographic studies, pulse oximetry and re-evaluation of patient's condition.   I personally performed the services described in this documentation, which was scribed in my presence. The recorded information has been reviewed and considered.  Rolland Porter, MD, Barbette Or, MD 01/22/16 [place], MD 01/22/16 [phone]

## 2016-01-22 NOTE — H&P (Signed)
[facility] History and Physical  Michael Evans [id number] DOB: 07-01-1941 DOA: 01/22/2016  Referring physician: Dr. Tomi Bamberger, Daleen Bo PCP: Odette Fraction, MD   Chief Complaint: Worsening shortness of breath, abdominal pain; general malaise and anorexia  HPI: Michael Evans is a 75 y.o. male with a past medical history significant for hypertension, obstructive sleep apnea, COPD with chronic respiratory failure (chronically on 2 L) and GERD; of presented to the emergency department with worsening shortness of breath and abdominal pain. Patient reports that for the last 3-4 days he has been experiencing worsening of his shortness of breath; he was seen by his PCP and was started on prednisone tapering for COPD exacerbation. In the last 24-40 hours patient has had even worsening shortness of breath, with associated chills, productive cough (yellow sputum, no blood) and associated bilateral abdominal discomfort from coughing. Patient endorses decreased appetite and general malaise. He denies chest pain, orthopnea, nausea, vomiting, hematemesis, melena, dysuria, diarrhea, headaches or any other complaints.  In the emergency department a chest x-ray demonstrated peribronchial thickening with mild bilateral central airspace opacities suggesting pneumonia; he was also found to have a heart rate up to 140, a RR of 33, WBC's of 30.5, CBG's of 216 (without history of diabetes) and low-grade fever with temp max of 99.4. [facility] was called to admit the patient for further evaluation and treatment.     Review of Systems:  Negative except as otherwise mentioned on history of present illness.  Past Medical History  Diagnosis Date  . COPD (chronic obstructive pulmonary disease) (HCC)     PFT 06-26-09 FEV1  1.75( 71%), FVC 3.65( 101%), FEV1% 48, TLC 5.53(104%), DLCO 55%, no BD  . Arthritis   . Hypertension   . Nasal septal deviation   . Chronic rhinitis   . OSA (obstructive sleep apnea)  04/21/2011  . Nocturia   . Skin cancer    Past Surgical History  Procedure Laterality Date  . Back surgery    . Appendectomy    . Cataract extraction w/phaco  01/06/2013    Procedure: CATARACT EXTRACTION PHACO AND INTRAOCULAR LENS PLACEMENT (IOC);  Surgeon: Tonny Branch, MD;  Location: [facility];  Service: Ophthalmology;  Laterality: Right;  CDE: 22.17   Social History:  reports that he quit smoking about 14 years ago. His smoking use included Cigarettes. He has a 81 pack-year smoking history. He has never used smokeless tobacco. He reports that he does not drink alcohol or use illicit drugs.  Allergies  Allergen Reactions  . Aspirin Other (See Comments)    Upset stomach.  . Penicillins Hives  . Sulfa Antibiotics Hives    Family History  Problem Relation Age of Onset  . Lung cancer Mother     Prior to Admission medications   Medication Sig Start Date End Date Taking? Authorizing Provider  albuterol (PROVENTIL HFA;VENTOLIN HFA) 108 (90 BASE) MCG/ACT inhaler Inhale 1-2 puffs into the lungs every 6 (six) hours as needed for wheezing or shortness of breath. 05/29/15   Chesley Mires, MD  albuterol (PROVENTIL) (2.5 MG/3ML) 0.083% nebulizer solution Take 3 mLs (2.5 mg total) by nebulization every 4 (four) hours as needed for wheezing or shortness of breath. 10/02/15   [name], MD  budesonide-formoterol [facility]) 160-4.5 MCG/ACT inhaler Inhale 2 puffs into the lungs 2 (two) times daily. 06/06/15   Chesley Mires, MD  cyanocobalamin 2000 MCG tablet Take 2,000 mcg by mouth daily.    Historical Provider, MD  diclofenac (VOLTAREN) 75 MG EC  tablet Take 1 tablet (75 mg total) by mouth 2 (two) times daily. 11/13/15   [name], MD  losartan (COZAAR) 50 MG tablet Take 1 tablet (50 mg total) by mouth daily. 12/31/15   [name], MD  Multiple Vitamin (MULTIVITAMIN) tablet Take 1 tablet by mouth daily.    Historical Provider, MD  predniSONE (DELTASONE) 20 MG tablet Take 1 tablet (20 mg total)  by mouth daily with breakfast. Patient not taking: Reported on 01/14/2016 12/31/15   [name], MD  predniSONE (DELTASONE) 20 MG tablet Take 3 tablets (60 mg total) by mouth daily with breakfast. 01/14/16   [name], MD  Umeclidinium Bromide (INCRUSE ELLIPTA) 62.5 MCG/INH AEPB Inhale 1 Inhaler into the lungs daily.    Historical Provider, MD   Physical Exam: Filed Vitals:   01/22/16 0230 01/22/16 0300 01/22/16 0330 01/22/16 0400  BP: 112/75 107/60 119/64 93/56  Pulse: 141 125 121 111  Temp:      TempSrc:      Resp: 27 24 29 26   Weight:      SpO2: 90% 85% 86% 93%    Wt Readings from Last 3 Encounters:  01/21/16 68.947 kg (152 lb)  01/14/16 71.668 kg (158 lb)  12/31/15 70.761 kg (156 lb)    General:  Warm to touch, with mild respiratory distress; unable to speak in full sentences, to keep make an with intercostal retraction. Denies chest pain, nausea, diaphoresis and orthopnea. Eyes: PERRL, normal lids, irises & conjunctiva, no icterus, no nystagmus ENT: grossly normal hearing, dry mucous membranes, fair dentition, no erythema or exudates inside his mouth; no signs of thrush. There is no drainage out of his ears or nostrils. Nasal cannula in place Neck: no LAD, masses or thyromegaly, no JVD Cardiovascular: Sinus tachycardia, no rubs, no gallops, no murmur. S1 and S2 appreciated on exam. No LE edema. Telemetry: Sinus tachycardia, no arrhythmia  Respiratory: Poor air movement, no crackles, diffuse rhonchi and wheezing. Mild intercostal retractions appreciated on exam.  Abdomen: soft, nondistended, positive bowel sounds, no guarding Skin: no rash, open wounds or induration seen on exam Musculoskeletal: grossly normal tone BUE/BLE Psychiatric: grossly normal mood and affect, speech fluent and appropriate Neurologic: grossly non-focal.          Labs on Admission:  Basic Metabolic Panel:  Recent Labs Lab 01/22/16 0012  NA 139  K 4.4  CL 99*  CO2 29  GLUCOSE 216*    BUN 21*  CREATININE 1.12  CALCIUM 9.8   Liver Function Tests:  Recent Labs Lab 01/22/16 0012  AST 29  ALT 40  ALKPHOS 58  BILITOT 0.9  PROT 7.4  ALBUMIN 3.9    Recent Labs Lab 01/22/16 0012  LIPASE 17   CBC:  Recent Labs Lab 01/22/16 0012  WBC 30.5*  NEUTROABS 27.4*  HGB 16.6  HCT 48.2  MCV 91.3  PLT 288   Cardiac Enzymes:  Recent Labs Lab 01/22/16 0012  TROPONINI <0.03    BNP (last 3 results)  Recent Labs  01/22/16 0012  BNP 64.0   Radiological Exams on Admission: Dg Chest 2 View  01/21/2016  CLINICAL DATA:  Acute onset of productive cough, worsening shortness of breath and lower chest pain. Indigestion. Initial encounter. EXAM: CHEST  2 VIEW COMPARISON:  Chest radiograph performed 03/14/2015, and CT of the chest performed 04/05/2015 FINDINGS: The lungs are well-aerated. Peribronchial thickening is noted. Mild bilateral central airspace opacities could reflect mild pneumonia, given the patient's symptoms. No pleural  effusion or pneumothorax is seen. A right-sided nipple shadow is noted. The heart is normal in size; the mediastinal contour is within normal limits. No acute osseous abnormalities are seen. IMPRESSION: Peribronchial thickening noted. Mild bilateral central airspace opacities could reflect mild pneumonia, given the patient's symptoms. Electronically Signed   By: Garald Balding M.D.   On: 01/21/2016 23:35    EKG:  Sinus tachycardia, no acute ischemic changes appreciated on EKG; poor R wave progression  Assessment/Plan 1-Acute on chronic respiratory failure with hypoxia [facility]): Patient requiring 2 more liters of oxygen supplementation in order to keep oxygen saturation above 90%; also unable to speak in full sentences, tachypneic and with intercostal retractions. -Patient will be admitted to telemetry -Will treat empirically with tamiflu and check for influenza  -Chest x-ray demonstrated new opacities consistent with community-acquired  pneumonia; which will treat with Levaquin IV (patient has history of penicillin allergies) -Tight on auscultation, with poor air movement and with diffuse rhonchi/wheezing; will continue Ellipta, start Pulmicort nebulizer treatment twice a day; Solu-Medrol 80 mg every 8 hours; guaifenesin 600mg  BID and the use of flutter valve -COPD order sets in place -Pneumonia order set in place; will follow blood cultures, urine cultures, sputum cultures, strep pneumo and legionella antigen in urine -Follow clinical response and continue supportive care  2-SIRS/Early sepsis: patient was very tachypneic on admission, low-grade temperature, with elevated WBC's and with elevated heart rate. - he received significant amount of albuterol   and was also on steroids for the last 4 days prior to admission; which is something that can mask/confused heart rate and wbc's from true sepsis.  -Will check lactic acid and procalcitonin  -will provide IV antibiotics -will follow clinical response   3-Essential hypertension, benign: Stable currently -Continue home antihypertensive regimen -Heart healthy diet has been ordered  4-OSA (obstructive sleep apnea): -Will continue CPAP QHS  5-Leukocytosis: Secondary to active infection, along with demargination from dehydration and use of steroids -will provide IVF's and IV antibiotics -will follow WBC's trend  6-Hyperglycemia: Without prior history of diabetes  -could be associated with use of steroids -CBG's > 200 range -will check A1C -start low dose levemir and moderate SSI -follow CBG's and adjust hypoglycemic regimen as needed  7-GERD/GI prophylaxis: will use PPI  8-sinus tachycardia: Most likely secondary to excessive use of albuterol (continue treatment in ED and multiple nebulization at home prior to come to ED); also due to dehydration -will provide IVF's -will monitor on telemetry  -will check TSH  9-B-12 deficiency: Continue B-12 supplementation   Code  Status: Full code DVT Prophylaxis: Lovenox Family Communication: Wife and sister at bedside Disposition Plan: Inpatient, LOS > 2 midnights; telemetry bed  Time spent: 70 minutes  Barton Dubois [facility] Pager [phone]

## 2016-01-22 NOTE — ED Notes (Signed)
CRITICAL VALUE ALERT  Critical value received:  Influenza A +  Date of notification:  [id number]  Time of notification:  0530  Critical value read back:Yes.    Nurse who received alert:  Charlies Silvers RN  MD notified (1st page):  [place]  Time of first page:  [phone]

## 2016-01-23 DIAGNOSIS — J9621 Acute and chronic respiratory failure with hypoxia: Secondary | ICD-10-CM

## 2016-01-23 DIAGNOSIS — J441 Chronic obstructive pulmonary disease with (acute) exacerbation: Secondary | ICD-10-CM

## 2016-01-23 DIAGNOSIS — A419 Sepsis, unspecified organism: Secondary | ICD-10-CM | POA: Diagnosis not present

## 2016-01-23 DIAGNOSIS — I10 Essential (primary) hypertension: Secondary | ICD-10-CM

## 2016-01-23 DIAGNOSIS — J189 Pneumonia, unspecified organism: Secondary | ICD-10-CM

## 2016-01-23 LAB — EXPECTORATED SPUTUM ASSESSMENT W REFEX TO RESP CULTURE

## 2016-01-23 LAB — GLUCOSE, CAPILLARY
GLUCOSE-CAPILLARY: 166 mg/dL — AB (ref 65–99)
GLUCOSE-CAPILLARY: 127 mg/dL — AB (ref 65–99)
Glucose-Capillary: 153 mg/dL — ABNORMAL HIGH (ref 65–99)
Glucose-Capillary: 226 mg/dL — ABNORMAL HIGH (ref 65–99)

## 2016-01-23 LAB — EXPECTORATED SPUTUM ASSESSMENT W GRAM STAIN, RFLX TO RESP C

## 2016-01-23 LAB — HEMOGLOBIN A1C
HEMOGLOBIN A1C: 7.2 % — AB (ref 4.8–5.6)
Mean Plasma Glucose: 160 mg/dL

## 2016-01-23 LAB — LACTIC ACID, PLASMA
LACTIC ACID, VENOUS: 2.6 mmol/L — AB (ref 0.5–2.0)
Lactic Acid, Venous: 2.2 mmol/L (ref 0.5–2.0)

## 2016-01-23 LAB — PROTIME-INR
INR: 1.13 (ref 0.00–1.49)
PROTHROMBIN TIME: 14.7 s (ref 11.6–15.2)

## 2016-01-23 LAB — HIV ANTIBODY (ROUTINE TESTING W REFLEX): HIV SCREEN 4TH GENERATION: NONREACTIVE

## 2016-01-23 LAB — APTT: aPTT: 25 seconds (ref 24–37)

## 2016-01-23 LAB — PROCALCITONIN: Procalcitonin: 0.35 ng/mL

## 2016-01-23 MED ORDER — SODIUM CHLORIDE 0.9 % IV BOLUS (SEPSIS)
500.0000 mL | INTRAVENOUS | Status: AC
  Administered 2016-01-23: 500 mL via INTRAVENOUS

## 2016-01-23 MED ORDER — SODIUM CHLORIDE 0.9 % IV BOLUS (SEPSIS)
1000.0000 mL | INTRAVENOUS | Status: AC
  Administered 2016-01-23 (×2): 1000 mL via INTRAVENOUS

## 2016-01-23 MED ORDER — GLUCERNA SHAKE PO LIQD
237.0000 mL | Freq: Two times a day (BID) | ORAL | Status: DC
  Administered 2016-01-23 – 2016-01-25 (×4): 237 mL via ORAL

## 2016-01-23 MED ORDER — LEVOFLOXACIN IN D5W 750 MG/150ML IV SOLN
750.0000 mg | INTRAVENOUS | Status: DC
  Administered 2016-01-23 – 2016-01-24 (×2): 750 mg via INTRAVENOUS
  Filled 2016-01-23 (×2): qty 150

## 2016-01-23 MED ORDER — CETYLPYRIDINIUM CHLORIDE 0.05 % MT LIQD
7.0000 mL | Freq: Two times a day (BID) | OROMUCOSAL | Status: DC
  Administered 2016-01-23 – 2016-01-25 (×4): 7 mL via OROMUCOSAL

## 2016-01-23 MED ORDER — ALBUTEROL SULFATE (2.5 MG/3ML) 0.083% IN NEBU
2.5000 mg | INHALATION_SOLUTION | RESPIRATORY_TRACT | Status: DC | PRN
  Administered 2016-01-24: 2.5 mg via RESPIRATORY_TRACT
  Filled 2016-01-23: qty 3

## 2016-01-23 NOTE — Progress Notes (Signed)
Initial Nutrition Assessment  DOCUMENTATION CODES:  Not applicable  INTERVENTION:  Glucerna Shake po BID, each supplement provides 220 kcal and 10 grams of protein  F/u for further education needs on diabetic diet  NUTRITION DIAGNOSIS:  Increased nutrient needs related to chronic illness as evidenced by estimated nutritional requirements for this disease state  GOAL:  Patient will meet greater than or equal to 90% of their needs  MONITOR:  PO intake, Supplement acceptance, Labs, education needs  REASON FOR ASSESSMENT:  Consult COPD Protocol  ASSESSMENT:  75 y/o male PMHx HTN, OSA, COPD w/ chronic resp failure, GERD presented to ED with worsening SOB and abdominal pain. Admitted/treated for SIRS/early sepsis and AoC resp failure w/ hypoxia.   Pt reports that he typically eats 2 meals a day at home. He took a b12 and b6 vitamin. He did not follow any type of diet though asserts that he eats relatively little sugar and salt. He was seen up out of bed and had just ate ~75% of his lunch. He said his UBW is 150. His weight is very stable.   Nutritionally, he is at his baseline.   Pt then reported that this admission he has been diagnosed with type 2 diabetes. RD asked if pt knew what a DM diet was. He stated he did, but upon further questioning it became apparent he did not know that much.   RD first addressed his habit of only eating 2 meals each day. Explained that pt should not skip meals as it leads to inconsistent BG levels. Pt was also under the impression that he should not eat any carbs/sugar. Explained that pt NEEDS to consume carbohydrates at each meal, but it needs to be the right amountt. Explained appropriate ratios of each food group at each meal. Instructed to eat protein/vegetables with all carbohydrates to help buffer the glycemic spike. Also reccommended that pt switch to whole grains. Did not do full diabetic consult. May benefit from further education at later  date.  Pt was agreeable to protein drinks. Had requested 2x bread. Unfortunately, given new dx of DM this may not be the best choice at this time.   NFPE: WDL  Labs reviewed: Elevated WBC, Hyperglycemia   Diet Order:  Diet Heart Room service appropriate?: Yes; Fluid consistency:: Thin  Skin:  Reviewed, no issues  Last BM:  2/22  Height:  Ht Readings from Last 1 Encounters:  01/23/16 5\' 5"  (1.651 m)   Weight:  Wt Readings from Last 1 Encounters:  01/23/16 149 lb 4 oz (67.7 kg)   Wt Readings from Last 10 Encounters:  01/23/16 149 lb 4 oz (67.7 kg)  01/14/16 158 lb (71.668 kg)  12/31/15 156 lb (70.761 kg)  12/28/15 150 lb (68.04 kg)  11/13/15 160 lb (72.576 kg)  10/12/15 156 lb (70.761 kg)  10/05/15 153 lb (69.4 kg)  10/03/15 153 lb (69.4 kg)  10/02/15 153 lb (69.4 kg)  05/29/15 149 lb (67.586 kg)  Dosing 148 lbs. (67.27 kg)  Ideal Body Weight:  59.1 kg  BMI:  Body mass index is 24.84 kg/(m^2).  Estimated Nutritional Needs:  Kcal:  1700-1900 (25-28 kcal/kg bw) Protein:  71-83 g Pro (1.2-1.4 g/kg bw) Fluid:  Per MD  EDUCATION NEEDS:  Education needs addressed  Burtis Junes RD, LDN Nutrition Pager: [phone] 01/23/2016 3:10 PM

## 2016-01-23 NOTE — Evaluation (Signed)
Occupational Therapy Evaluation Patient Details Name: Michael Evans MRN: [id number] DOB: 1941/09/06 Today's Date: 01/23/2016    History of Present Illness 75yo white male comes to [facility] on 2/20 p 3-4 d of worsenign SOB. PMH: HTN, COPD, and chronic respiratory failure on 2L at home. Baseline includes full indep in ADL, IADL, with O2.    Clinical Impression   Pt awake, alert, oriented x3 this am, agreeable to evaluation. Pt reports feeling much improved this am. Pt appears to be at baseline with functional ADL completion, did display some confusion with sequencing during toileting hygiene tasks-attempting to use same washcloth for perineal hygiene and hand hygiene. Pt does not have any home equipment at this time, none recommended by OT. Pt is safe to discharge home when medically ready, no further OT services required at this time.     Follow Up Recommendations  No OT follow up          Precautions / Restrictions Precautions Precautions: None Restrictions Weight Bearing Restrictions: No      Mobility Bed Mobility Overal bed mobility: Needs Assistance Bed Mobility: Supine to Sit     Supine to sit: Supervision     General bed mobility comments: supervision for lines and leads.  Transfers Overall transfer level: Needs assistance Equipment used: None Transfers: Sit to/from Stand Sit to Stand: Supervision         General transfer comment: supervision for lines and leads. Appears safe and balanced, no AD.     Balance Overall balance assessment: No apparent balance deficits (not formally assessed) (reports occasioal LOB at home with falls, tripping over objects; denies LOC, dizziness, etc. No injury incurred,. No LOB thsi session, stands and balances to perform perineal hygiene. )                                          ADL Overall ADL's : Modified independent                     Lower Body Dressing: Modified independent;Sit to/from stand    Toilet Transfer: [place] and Hygiene: Set up       Functional mobility during ADLs: Supervision/safety       Vision Vision Assessment?: No apparent visual deficits          Pertinent Vitals/Pain Pain Assessment: No/denies pain     Hand Dominance Right   Extremity/Trunk Assessment Upper Extremity Assessment Upper Extremity Assessment: Overall WFL for tasks assessed   Lower Extremity Assessment Lower Extremity Assessment: Defer to PT evaluation   Cervical / Trunk Assessment Cervical / Trunk Assessment: Normal   Communication Communication Communication: No difficulties   Cognition Arousal/Alertness: Awake/alert Behavior During Therapy: WFL for tasks assessed/performed Overall Cognitive Status: Within Functional Limits for tasks assessed                                Home Living Family/patient expects to be discharged to:: Private residence Living Arrangements: Other relatives (sister) Available Help at Discharge: Family Type of Home: House             Bathroom Shower/Tub: Teaching laboratory technician Toilet: Standard     Home Equipment: None   Additional Comments: Pt reports independence at home, no equipment necessary prior to hospital admission  Prior Functioning/Environment Level of Independence: Independent        Comments: Requires 2L O2 chronically.              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

## 2016-01-23 NOTE — Evaluation (Signed)
Physical Therapy Evaluation Patient Details Name: Michael Evans MRN: [id number] DOB: Aug 27, 1941 Today's Date: 01/23/2016   History of Present Illness  75yo white male comes to [facility] on 2/20 p 3-4 d of worsenign SOB. PMH: HTN, COPD, and chronic respiratory failure on 2L at home. Baseline includes full indep in ADL, IADL, with O2.   Clinical Impression  At evaluation, pt is received semirecumbent in bed upon entry, no family/caregiver present, but OT in room. The pt is awake and agreeable to participate. No acute distress noted at this time. The pt is alert and oriented x3, pleasant, conversational, and following simple and multi-step commands consistently. Pt reports 1 or 2 falls in the last 6 months, however demonstrates no LOB throughout session. Global strength as screened during functional mobility assessment presents as baseline per the patient, however he mentions that he feels his balance is mildly off from baseline, although no in-depth assessment is made at this time. Pt received on and remaining on 3L O2 throughout evaluation, with noted desaturation with exertion (85%) which correlates to valsalva, whereas pt is on O2 at baseline at home. Patient presenting with impairment of balance, oxygen perfusion, and activity tolerance, limiting ability to perform ADL and mobility tasks at  baseline level of function. Patient will benefit from skilled intervention to address the above impairments and limitations, in order to restore to prior level of function, improve patient safety upon discharge, and to decrease falls risk. Pt is appropriate for DC to home at this time, once he is medically cleared. All skilled PT needs can be met at the next venue of care. PT signing off.      Follow Up Recommendations Home health PT    Equipment Recommendations  None recommended by PT    Recommendations for Other Services       Precautions / Restrictions Precautions Precautions: None Restrictions Weight  Bearing Restrictions: No      Mobility  Bed Mobility Overal bed mobility: Needs Assistance Bed Mobility: Supine to Sit     Supine to sit: Supervision     General bed mobility comments: supervision for lines and leads.  Transfers Overall transfer level: Needs assistance Equipment used: None Transfers: Sit to/from Stand Sit to Stand: Supervision         General transfer comment: supervision for lines and leads. Appears safe and balanced, no AD.   Ambulation/Gait Ambulation/Gait assistance: Supervision Ambulation Distance (Feet): 20 Feet Assistive device: None Gait Pattern/deviations: WFL(Within Functional Limits)   Gait velocity interpretation: <1.8 ft/sec, indicative of risk for recurrent falls General Gait Details: slow, steady, and cautious; supervision for lines, and leads, maintains sats well.   Stairs            Wheelchair Mobility    Modified Rankin (Stroke Patients Only)       Balance Overall balance assessment: No apparent balance deficits (not formally assessed) (reports occasioal LOB at home with falls, tripping over objects; denies LOC, dizziness, etc. No injury incurred,. No LOB thsi session, stands and balances to perform perineal hygiene. )                                           Pertinent Vitals/Pain Pain Assessment: No/denies pain    Home Living Family/patient expects to be discharged to:: Private residence Living Arrangements: Other relatives (Sister ) Available Help at Discharge: Family  Additional Comments: See OT note for full details.     Prior Function Level of Independence: Independent         Comments: Requires 2L O2 chronically.      Hand Dominance        Extremity/Trunk Assessment   Upper Extremity Assessment: Defer to OT evaluation           Lower Extremity Assessment: Overall WFL for tasks assessed (reports to be at baseline after mobility. )      Cervical / Trunk  Assessment: Normal  Communication   Communication: No difficulties  Cognition Arousal/Alertness: Awake/alert Behavior During Therapy: WFL for tasks assessed/performed Overall Cognitive Status: Within Functional Limits for tasks assessed                      General Comments      Exercises        Assessment/Plan    PT Assessment All further PT needs can be met in the next venue of care  PT Diagnosis Generalized weakness   PT Problem List Decreased balance;Decreased activity tolerance  PT Treatment Interventions     PT Goals (Current goals can be found in the Care Plan section) Acute Rehab PT Goals Patient Stated Goal: 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 PT Goal Formulation: With patient Time For Goal Achievement: 02/06/16 Potential to Achieve Goals: Good    Frequency     Barriers to discharge        Co-evaluation               End of Session Equipment Utilized During Treatment: Oxygen Activity Tolerance: Patient tolerated treatment well Patient left: in chair;with call bell/phone within reach Nurse Communication: Mobility status;Other (comment)         Time: 6922-3009 PT Time Calculation (min) (ACUTE ONLY): 15 min   Charges:   PT Evaluation $PT Eval Low Complexity: 1 Procedure     PT G Codes:       9:45 AM, 02-13-16 Etta Grandchild, PT, DPT PRN [facility]rap[facility] at [place] License # [id number] [phone] (wireless)  [phone] (mobile)

## 2016-01-23 NOTE — Progress Notes (Signed)
[facility] PROGRESS NOTE  CHENG FUGITT [id number] DOB: Jun 24, 1941 DOA: 01/22/2016 PCP: Odette Fraction, MD  Assessment/Plan: 1. Acute on chronic hypoxemic respiratory failure, multifactorial related to flu, PNA, and COPD. O2 sat 85% on presentation. Uses 2L O2 at home, now requiring 6L. Continue empiric IV abx. Continue systemic steroids and nebs. 2. CAP, CXR revealed possible PNA. Will continue pt on levaquin. 3. Influenza, labs positive for influenza A. continue Tamiflu PO twice daily. 4. Sepsis, secondary to influenza and PNA. Lactic acid 4.5. WBC was 30.5 on admission. Continue IV levaquin and IV fluids per sepsis protocol. Follow BC. 5. COPD exacerbation, likely precipitated by infection. Requires 2L O2 at baseline. Continue Solu-medrol IV and DuoNebs. 6. DM type 2, continue Levemir SQ daily with sliding scale coverage  Code Status: Full DVT prophylaxis: Lovenox Family Communication: No family at bedside Disposition Plan: Anticipate discharge once improved   Consultants:  none  Procedures:  none  Antibiotics:  Levaquin 2/21 >>  HPI/Subjective: Still has lower stomach pains that hurts when it is pushed on. Pain is possibly due to productive cough. Defecating and urinating normally. Wheezing a little bit, but breathing is doing all right. Normally wears 2L O2 at home.   Objective: Filed Vitals:   01/23/16 0500 01/23/16 0600  BP: 144/86 148/86  Pulse: 76 74  Temp:    Resp: 27 27    Intake/Output Summary (Last 24 hours) at 01/23/16 0803 Last data filed at 01/23/16 0549  Gross per 24 hour  Intake 2216.67 ml  Output    700 ml  Net 1516.67 ml   Filed Weights   01/21/16 2254 01/22/16 1329 01/23/16 0550  Weight: 68.947 kg (152 lb) 67.2 kg (148 lb 2.4 oz) 67.7 kg (149 lb 4 oz)    Exam:  General: NAD, looks comfortable Cardiovascular: RRR, S1, S2  Respiratory: Diminished breath sounds with mild wheeze bilaterally. Abdomen: soft, non tender, no  distention , bowel sounds normal Musculoskeletal: No edema b/l   Data Reviewed: Basic Metabolic Panel:  Recent Labs Lab 01/22/16 0012  NA 139  K 4.4  CL 99*  CO2 29  GLUCOSE 216*  BUN 21*  CREATININE 1.12  CALCIUM 9.8  MG 2.1  PHOS 3.1   Liver Function Tests:  Recent Labs Lab 01/22/16 0012  AST 29  ALT 40  ALKPHOS 58  BILITOT 0.9  PROT 7.4  ALBUMIN 3.9    Recent Labs Lab 01/22/16 0012  LIPASE 17   CBC:  Recent Labs Lab 01/22/16 0012  WBC 30.5*  NEUTROABS 27.4*  HGB 16.6  HCT 48.2  MCV 91.3  PLT 288   Cardiac Enzymes:  Recent Labs Lab 01/22/16 0012  TROPONINI <0.03   BNP (last 3 results)  Recent Labs  01/22/16 0012  BNP 64.0    CBG:  Recent Labs Lab 01/22/16 1643  GLUCAP 189*    Recent Results (from the past 240 hour(s))  Culture, blood (routine x 2) Call MD if unable to obtain prior to antibiotics being given     Status: None (Preliminary result)   Collection Time: 01/22/16 12:20 PM  Result Value Ref Range Status   Specimen Description BLOOD LEFT ANTECUBITAL  Final   Special Requests   Final    BOTTLES DRAWN AEROBIC AND ANAEROBIC AEB=8CC ANA=5CC   Culture NO GROWTH < 12 HOURS  Final   Report Status PENDING  Incomplete  MRSA PCR Screening     Status: None   Collection Time: 01/22/16  1:30 PM  Result Value Ref Range Status   MRSA by PCR NEGATIVE NEGATIVE Final    Comment:        The GeneXpert MRSA Assay (FDA approved for NASAL specimens only), is one component of a comprehensive MRSA colonization surveillance program. It is not intended to diagnose MRSA infection nor to guide or monitor treatment for MRSA infections.   Culture, sputum-assessment     Status: None   Collection Time: 01/22/16  5:00 PM  Result Value Ref Range Status   Specimen Description SPUTUM EXPECTORATED  Final   Special Requests NONE  Final   Sputum evaluation   Final    MICROSCOPIC FINDINGS SUGGEST THAT THIS SPECIMEN IS NOT REPRESENTATIVE OF LOWER  RESPIRATORY SECRETIONS. PLEASE RECOLLECT. Gram Stain Report Called to,Read Back By and Verified With: SHERRY HANOCK,RN @0050  01/23/16 MKELLY Performed at [facility]    Report Status 01/23/2016 FINAL  Final     Studies: Dg Chest 2 View  01/21/2016  CLINICAL DATA:  Acute onset of productive cough, worsening shortness of breath and lower chest pain. Indigestion. Initial encounter. EXAM: CHEST  2 VIEW COMPARISON:  Chest radiograph performed 03/14/2015, and CT of the chest performed 04/05/2015 FINDINGS: The lungs are well-aerated. Peribronchial thickening is noted. Mild bilateral central airspace opacities could reflect mild pneumonia, given the patient's symptoms. No pleural effusion or pneumothorax is seen. A right-sided nipple shadow is noted. The heart is normal in size; the mediastinal contour is within normal limits. No acute osseous abnormalities are seen. IMPRESSION: Peribronchial thickening noted. Mild bilateral central airspace opacities could reflect mild pneumonia, given the patient's symptoms. Electronically Signed   By: Garald Balding M.D.   On: 01/21/2016 23:35    Scheduled Meds: . antiseptic oral rinse  7 mL Mouth Rinse BID  . budesonide (PULMICORT) nebulizer solution  0.25 mg Nebulization BID  . enoxaparin (LOVENOX) injection  40 mg Subcutaneous Q24H  . guaiFENesin  600 mg Oral BID  . Influenza vac split quadrivalent PF  0.5 mL Intramuscular Tomorrow-1000  . insulin aspart  0-15 Units Subcutaneous TID WC  . insulin detemir  5 Units Subcutaneous Daily  . [START ON 01/24/2016] levofloxacin (LEVAQUIN) IV  750 mg Intravenous Q48H  . losartan  50 mg Oral Daily  . methylPREDNISolone (SOLU-MEDROL) injection  80 mg Intravenous 3 times per day  . multivitamin with minerals  1 tablet Oral Daily  . oseltamivir  30 mg Oral BID  . pantoprazole  40 mg Oral Daily  . pneumococcal 23 valent vaccine  0.5 mL Intramuscular Tomorrow-1000  . umeclidinium bromide  1 puff Inhalation Daily  .  cyanocobalamin  2,000 mcg Oral Daily   Continuous Infusions: . sodium chloride 100 mL/hr at 01/23/16 [id number]    Principal Problem:   Acute on chronic respiratory failure with hypoxia (HCC) Active Problems:   Essential hypertension, benign   OSA (obstructive sleep apnea)   Community acquired pneumonia   COPD with exacerbation (HCC)   SIRS (systemic inflammatory response syndrome) (HCC)   Leukocytosis   Hyperglycemia   Respiratory failure ([place])  Time spent: 25 minutes  Kathie Dike, MD. [facility] Pager [phone]. If 7PM-7AM, please contact night-coverage at [email or web address], password [facility] 01/23/2016, 8:03 AM  LOS: 1 day     By signing my name below, I, Delene Ruffini, attest that this documentation has been prepared under the direction and in the presence of Kathie Dike, MD. Electronically Signed: Delene Ruffini  01/23/2016 10: 45am  I, Dr. Kathie Dike, personally performed the services described  in this documentaiton. All medical record entries made by the scribe were at my direction and in my presence. I have reviewed the chart and agree that the record reflects my personal performance and is accurate and complete  Kathie Dike, MD, 01/23/2016 10:56 AM

## 2016-01-23 NOTE — Progress Notes (Signed)
Pharmacy Antibiotic Note  Michael Evans is a 75 y.o. male admitted on 01/22/2016 with pneumonia. And influenza Pharmacy has been consulted for renal adjust dosing.  Plan: Levaquin 750 mg IV q24 hours (for clcr > 50, normalized clcr ~50-46ml/min) Continue tamiflu 30 mg po bid (for clcr < 60 as above) Monitor renal fxn, SCr  Height: 5\' 5"  (165.1 cm) Weight: 149 lb 4 oz (67.7 kg) IBW/kg (Calculated) : 61.5  Temp (24hrs), Avg:99.7 F (37.6 C), Min:98.9 F (37.2 C), Max:101.7 F (38.7 C)   Recent Labs Lab 01/22/16 0012 01/22/16 1216  WBC 30.5*  --   CREATININE 1.12  --   LATICACIDVEN  --  4.5*    Estimated Creatinine Clearance: 48 mL/min (by C-G formula based on Cr of 1.12).    Allergies  Allergen Reactions  . Aspirin Other (See Comments)    Upset stomach.  . Other Hypertension    Hazelnuts  . Penicillins Hives    Has patient had a PCN reaction causing immediate rash, facial/tongue/throat swelling, SOB or lightheadedness with hypotension: Yes Has patient had a PCN reaction causing severe rash involving mucus membranes or skin necrosis: No Has patient had a PCN reaction that required hospitalization No Has patient had a PCN reaction occurring within the last 10 years: No If all of the above answers are "NO", then may proceed with Cephalosporin use.   . Sulfa Antibiotics Hives   Antimicrobials this admission: Levaquin  2/21  >>  Tamiflu  2/21 >> 2/26  Thank you for allowing pharmacy to be a part of this patient's care.  Hart Robinsons A 01/23/2016 11:37 AM

## 2016-01-23 NOTE — Progress Notes (Signed)
Patient refusing CPAP tonight, hospital unit at bedside.

## 2016-01-24 ENCOUNTER — Other Ambulatory Visit: Payer: Medicare Other

## 2016-01-24 DIAGNOSIS — J101 Influenza due to other identified influenza virus with other respiratory manifestations: Secondary | ICD-10-CM

## 2016-01-24 LAB — GLUCOSE, CAPILLARY
GLUCOSE-CAPILLARY: 239 mg/dL — AB (ref 65–99)
Glucose-Capillary: 154 mg/dL — ABNORMAL HIGH (ref 65–99)
Glucose-Capillary: 209 mg/dL — ABNORMAL HIGH (ref 65–99)
Glucose-Capillary: 216 mg/dL — ABNORMAL HIGH (ref 65–99)

## 2016-01-24 LAB — EXPECTORATED SPUTUM ASSESSMENT W GRAM STAIN, RFLX TO RESP C

## 2016-01-24 LAB — CBC
HEMATOCRIT: 38.5 % — AB (ref 39.0–52.0)
HEMOGLOBIN: 12.4 g/dL — AB (ref 13.0–17.0)
MCH: 29.7 pg (ref 26.0–34.0)
MCHC: 32.2 g/dL (ref 30.0–36.0)
MCV: 92.3 fL (ref 78.0–100.0)
Platelets: 200 10*3/uL (ref 150–400)
RBC: 4.17 MIL/uL — AB (ref 4.22–5.81)
RDW: 13.9 % (ref 11.5–15.5)
WBC: 13.2 10*3/uL — AB (ref 4.0–10.5)

## 2016-01-24 LAB — BASIC METABOLIC PANEL
ANION GAP: 6 (ref 5–15)
BUN: 24 mg/dL — ABNORMAL HIGH (ref 6–20)
CO2: 25 mmol/L (ref 22–32)
Calcium: 7.9 mg/dL — ABNORMAL LOW (ref 8.9–10.3)
Chloride: 110 mmol/L (ref 101–111)
Creatinine, Ser: 0.8 mg/dL (ref 0.61–1.24)
Glucose, Bld: 162 mg/dL — ABNORMAL HIGH (ref 65–99)
POTASSIUM: 3.8 mmol/L (ref 3.5–5.1)
SODIUM: 141 mmol/L (ref 135–145)

## 2016-01-24 LAB — PROCALCITONIN: PROCALCITONIN: 0.22 ng/mL

## 2016-01-24 MED ORDER — ALBUTEROL SULFATE (2.5 MG/3ML) 0.083% IN NEBU
2.5000 mg | INHALATION_SOLUTION | Freq: Three times a day (TID) | RESPIRATORY_TRACT | Status: DC
  Administered 2016-01-24 – 2016-01-25 (×2): 2.5 mg via RESPIRATORY_TRACT
  Filled 2016-01-24 (×3): qty 3

## 2016-01-24 MED ORDER — OSELTAMIVIR PHOSPHATE 75 MG PO CAPS
75.0000 mg | ORAL_CAPSULE | Freq: Two times a day (BID) | ORAL | Status: DC
  Administered 2016-01-24 – 2016-01-25 (×2): 75 mg via ORAL
  Filled 2016-01-24 (×2): qty 1

## 2016-01-24 MED ORDER — METHYLPREDNISOLONE SODIUM SUCC 125 MG IJ SOLR
60.0000 mg | Freq: Two times a day (BID) | INTRAMUSCULAR | Status: DC
  Administered 2016-01-24 – 2016-01-25 (×2): 60 mg via INTRAVENOUS
  Filled 2016-01-24 (×2): qty 2

## 2016-01-24 NOTE — Progress Notes (Signed)
[facility] PROGRESS NOTE  OMAN MOSEY [id number] DOB: Apr 30, 1941 DOA: 01/22/2016 PCP: Odette Fraction, MD  Assessment/Plan: 1. Acute on chronic hypoxemic respiratory failure, multifactorial related to flu, PNA, and COPD. O2 sat 85% on presentation. Uses 2L O2 at home, now requiring 3L. Continue empiric IV abx, steroids and nebs. 2. CAP, CXR revealed possible PNA. Continue levaquin. 3. Influenza, labs positive for influenza A. continue Tamiflu PO twice daily. 4. Sepsis, secondary to influenza and PNA. Lactic acid 4.5. WBC was 30.5 on admission. WBC trending down to 13.2 today.  Continue IV levaquin and IV fluids per sepsis protocol. BC in process. 5. COPD exacerbation, likely precipitated by infection. Requires 2L O2 at baseline. Will start to taper Solu-medrol. Continue DuoNebs. 6. DM type 2, continue Levemir SQ daily with sliding scale coverage  Code Status: Full DVT prophylaxis: Lovenox Family Communication: No family at bedside Disposition Plan: Transfer to medical bed. Anticipate discharge in 24 hours.    Consultants:  PT- Home health PT  OT- No follow up needed.  Procedures:  none  Antibiotics:  Levaquin 2/21 >>  HPI/Subjective: Feeling better. Moderate productive cough. No abdominal pain. Was able to sit in room chair.   Objective: Filed Vitals:   01/24/16 0400 01/24/16 0500  BP: 148/77   Pulse: 48 48  Temp: 98 F (36.7 C)   Resp: 19 23    Intake/Output Summary (Last 24 hours) at 01/24/16 0640 Last data filed at 01/24/16 0500  Gross per 24 hour  Intake 518.33 ml  Output   1000 ml  Net -481.67 ml   Filed Weights   01/22/16 1329 01/23/16 0550 01/24/16 0500  Weight: 67.2 kg (148 lb 2.4 oz) 67.7 kg (149 lb 4 oz) 71.5 kg (157 lb 10.1 oz)    Exam:  General: NAD, looks comfortable  Cardiovascular: RRR, S1, S2   Respiratory: Mild wheeze bilaterally. Overall air movement improved from yesterday. Normal respiratory effort. Able to speak in  full sentences.   Abdomen: soft, non tender, no distention , bowel sounds normal  Musculoskeletal: No edema b/l  Data Reviewed: Basic Metabolic Panel:  Recent Labs Lab 01/22/16 0012 01/24/16 0434  NA 139 141  K 4.4 3.8  CL 99* 110  CO2 29 25  GLUCOSE 216* 162*  BUN 21* 24*  CREATININE 1.12 0.80  CALCIUM 9.8 7.9*  MG 2.1  --   PHOS 3.1  --    Liver Function Tests:  Recent Labs Lab 01/22/16 0012  AST 29  ALT 40  ALKPHOS 58  BILITOT 0.9  PROT 7.4  ALBUMIN 3.9    Recent Labs Lab 01/22/16 0012  LIPASE 17   CBC:  Recent Labs Lab 01/22/16 0012 01/24/16 0434  WBC 30.5* 13.2*  NEUTROABS 27.4*  --   HGB 16.6 12.4*  HCT 48.2 38.5*  MCV 91.3 92.3  PLT 288 200   Cardiac Enzymes:  Recent Labs Lab 01/22/16 0012  TROPONINI <0.03   BNP (last 3 results)  Recent Labs  01/22/16 0012  BNP 64.0    CBG:  Recent Labs Lab 01/22/16 1643 01/23/16 0814 01/23/16 1211 01/23/16 1657 01/23/16 2111  GLUCAP 189* 153* 166* 127* 226*    Recent Results (from the past 240 hour(s))  Culture, blood (routine x 2) Call MD if unable to obtain prior to antibiotics being given     Status: None (Preliminary result)   Collection Time: 01/22/16 12:20 PM  Result Value Ref Range Status   Specimen Description BLOOD LEFT ANTECUBITAL  Final  Special Requests   Final    BOTTLES DRAWN AEROBIC AND ANAEROBIC AEB=8CC ANA=5CC   Culture NO GROWTH 1 DAY  Final   Report Status PENDING  Incomplete  MRSA PCR Screening     Status: None   Collection Time: 01/22/16  1:30 PM  Result Value Ref Range Status   MRSA by PCR NEGATIVE NEGATIVE Final    Comment:        The GeneXpert MRSA Assay (FDA approved for NASAL specimens only), is one component of a comprehensive MRSA colonization surveillance program. It is not intended to diagnose MRSA infection nor to guide or monitor treatment for MRSA infections.   Culture, sputum-assessment     Status: None   Collection Time: 01/22/16   5:00 PM  Result Value Ref Range Status   Specimen Description SPUTUM EXPECTORATED  Final   Special Requests NONE  Final   Sputum evaluation   Final    MICROSCOPIC FINDINGS SUGGEST THAT THIS SPECIMEN IS NOT REPRESENTATIVE OF LOWER RESPIRATORY SECRETIONS. PLEASE RECOLLECT. Gram Stain Report Called to,Read Back By and Verified With: SHERRY HANOCK,RN @0050  01/23/16 MKELLY Performed at [facility]    Report Status 01/23/2016 FINAL  Final  Culture, expectorated sputum-assessment     Status: None   Collection Time: 01/23/16  1:30 PM  Result Value Ref Range Status   Specimen Description SPUTUM EXPECTORATED  Final   Special Requests NONE  Final   Sputum evaluation   Final    THIS SPECIMEN IS ACCEPTABLE FOR SPUTUM CULTURE Performed at [facility]    Report Status 01/24/2016 FINAL  Final     Studies: No results found.  Scheduled Meds: . antiseptic oral rinse  7 mL Mouth Rinse BID  . budesonide (PULMICORT) nebulizer solution  0.25 mg Nebulization BID  . enoxaparin (LOVENOX) injection  40 mg Subcutaneous Q24H  . feeding supplement (GLUCERNA SHAKE)  237 mL Oral BID BM  . guaiFENesin  600 mg Oral BID  . insulin aspart  0-15 Units Subcutaneous TID WC  . insulin detemir  5 Units Subcutaneous Daily  . levofloxacin (LEVAQUIN) IV  750 mg Intravenous Q24H  . losartan  50 mg Oral Daily  . methylPREDNISolone (SOLU-MEDROL) injection  80 mg Intravenous 3 times per day  . multivitamin with minerals  1 tablet Oral Daily  . oseltamivir  30 mg Oral BID  . pantoprazole  40 mg Oral Daily  . umeclidinium bromide  1 puff Inhalation Daily  . cyanocobalamin  2,000 mcg Oral Daily   Continuous Infusions: . sodium chloride 100 mL/hr at 01/24/16 0500    Principal Problem:   Acute on chronic respiratory failure with hypoxia (HCC) Active Problems:   Essential hypertension, benign   OSA (obstructive sleep apnea)   Community acquired pneumonia   COPD with exacerbation (HCC)   SIRS (systemic  inflammatory response syndrome) (HCC)   Leukocytosis   Hyperglycemia   Respiratory failure ([place])  Time spent: 25 minutes  Kathie Dike, MD. [facility] Pager [phone]. If 7PM-7AM, please contact night-coverage at [email or web address], password [facility] 01/24/2016, 6:40 AM  LOS: 2 days     By signing my name below, I, Rennis Harding, attest that this documentation has been prepared under the direction and in the presence of Kathie Dike, MD. Electronically signed: Rennis Harding, Scribe. 01/24/2016 5:56am  I, Dr. Kathie Dike, personally performed the services described in this documentaiton. All medical record entries made by the scribe were at my direction and in my presence. I have reviewed the  chart and agree that the record reflects my personal performance and is accurate and complete  Kathie Dike, MD, 01/24/2016 9:11 AM

## 2016-01-24 NOTE — Progress Notes (Signed)
Called report to [place], Therapist, sports on dept 300. Verbalized understanding. Pt transferred to room 328 in safe and stable condition.

## 2016-01-25 LAB — BASIC METABOLIC PANEL
Anion gap: 11 (ref 5–15)
BUN: 24 mg/dL — AB (ref 6–20)
CALCIUM: 8.5 mg/dL — AB (ref 8.9–10.3)
CO2: 27 mmol/L (ref 22–32)
CREATININE: 0.85 mg/dL (ref 0.61–1.24)
Chloride: 104 mmol/L (ref 101–111)
GFR calc Af Amer: >60 mL/min (ref >60)
GFR calc non Af Amer: >60 mL/min (ref >60)
GLUCOSE: 186 mg/dL — AB (ref 65–99)
Potassium: 3.8 mmol/L (ref 3.5–5.1)
Sodium: 142 mmol/L (ref 135–145)

## 2016-01-25 LAB — GLUCOSE, CAPILLARY
Glucose-Capillary: 162 mg/dL — ABNORMAL HIGH (ref 65–99)
Glucose-Capillary: 212 mg/dL — ABNORMAL HIGH (ref 65–99)

## 2016-01-25 MED ORDER — PREDNISONE 10 MG PO TABS: ORAL_TABLET | ORAL | Status: DC

## 2016-01-25 MED ORDER — OSELTAMIVIR PHOSPHATE 75 MG PO CAPS: 75.0000 mg | ORAL_CAPSULE | Freq: Two times a day (BID) | ORAL | Status: DC

## 2016-01-25 MED ORDER — GUAIFENESIN ER 600 MG PO TB12: 600.0000 mg | ORAL_TABLET | Freq: Two times a day (BID) | ORAL | Status: DC

## 2016-01-25 MED ORDER — LEVOFLOXACIN 750 MG PO TABS: 750.0000 mg | ORAL_TABLET | Freq: Every day | ORAL | Status: DC

## 2016-01-25 NOTE — Progress Notes (Signed)
Pt d/c'd home via private automobile.  He is assisted by son who lives with him.  All prescriptions given and reviewed with patient and son.  F/U was made for patient at [place].  All questions and concerns addressed.  He is leaving in stable condition.

## 2016-01-25 NOTE — Discharge Summary (Signed)
Physician Discharge Summary  Michael Evans [id number] DOB: 06/20/1941 DOA: 01/22/2016  PCP: Odette Fraction, MD  Admit date: 01/22/2016 Discharge date: 01/25/2016  Time spent: 35 minutes  Recommendations for Outpatient Follow-up:  1. Follow-up with PCP within 1-2 weeks for resolution of respiratory failure and influenza.  2. Discharge home with [place].    Discharge Diagnoses:  Principal Problem:   Acute on chronic respiratory failure with hypoxia (HCC) Active Problems:   Essential hypertension, benign   OSA (obstructive sleep apnea)   Community acquired pneumonia   COPD with exacerbation (HCC)   SIRS (systemic inflammatory response syndrome) (HCC)   Leukocytosis   Hyperglycemia   Respiratory failure ([place])   Influenza A   Discharge Condition: Improved   Diet recommendation: Heart healthy   Filed Weights   01/22/16 1329 01/23/16 0550 01/24/16 0500  Weight: 67.2 kg (148 lb 2.4 oz) 67.7 kg (149 lb 4 oz) 71.5 kg (157 lb 10.1 oz)    History of present illness:  75 y.o. male with a past medical history of hypertension, obstructive sleep apnea, COPD with chronic respiratory failure (chronically on 2 L) and GERD; presented to the ED with complaints of worsening shortness of breath and abdominal pain. He was seen by his PCP and was started on prednisone tapering for COPD exacerbation. In the last 24-40 hours patient has had even worsening shortness of breath, with associated chills, productive cough (yellow sputum, no blood) and associated bilateral abdominal discomfort from coughing. He also reports decreased appetite and general malaise.In the ED CXR demonstrated peribronchial thickening with mild bilateral central airspace opacities suggesting pneumonia; he was also found to have a heart rate up to 140, a RR of 33, WBC's of 30.5, CBG's of 216 (without history of diabetes) and low-grade fever with temp max of 99.4. [facility] hospital[facility] was called to admit the patient for further  evaluation and treatment.  Hospital Course:  On admission patient noted to be in acute on chronic hypoxemic respiratory failure, which was multifactorial related to flu, PNA, and COPD. O2 sat 85% on presentation. CXR on admission revealed possible PNA. Uses 2L O2 at home, yet requiring 3L on admission. Was started on IV abx, steroids and nebs with improvement in symptoms. On discharge was able to wean down to baseline requirement of 2L. He was also transitioned to oral abx and steroid taper on discharge.  1. CAP, CXR revealed possible PNA. Transitioned to oral abx on discharge. 2. Influenza, labs positive for influenza A. continue Tamiflu PO twice daily. 3. Sepsis, secondary to influenza and PNA. Resolved. Lactic acid 4.5. WBC was 30.5 on admission. WBC improved and he remained afebrile. O admission started on IV levaquin and IV fluids per sepsis protocol. BC were unremarkable.  4. COPD exacerbation, likely precipitated by infection. Requires 2L O2 at baseline. On discharge continue steroid taper, oral abx, and NEB PRN.  5. DM type 2, continue Levemir SQ daily with sliding scale coverage  Procedures:  None  Consultations:  PT- HHPT  OT- No follow up needed.  Discharge Exam: Filed Vitals:   01/25/16 0400 01/25/16 0613  BP:  103/78  Pulse: 68 98  Temp:  98 F (36.7 C)  Resp:  20    General: NAD, looks comfortable Cardiovascular: RRR, S1, S2  Respiratory: Diminished breath sounds bilaterally, No wheezing, rales or rhonchi. Able to speak in full sentences. Abdomen: soft, non tender, no distention , bowel sounds normal Musculoskeletal: No edema b/l  Discharge Instructions    Current Discharge Medication  List    START taking these medications   Details  guaiFENesin (MUCINEX) 600 MG 12 hr tablet Take 1 tablet (600 mg total) by mouth 2 (two) times daily. Qty: 30 tablet, Refills: 0    levofloxacin (LEVAQUIN) 750 MG tablet Take 1 tablet (750 mg total) by mouth daily. Qty: 4  tablet, Refills: 0    oseltamivir (TAMIFLU) 75 MG capsule Take 1 capsule (75 mg total) by mouth 2 (two) times daily. Qty: 4 capsule, Refills: 0      CONTINUE these medications which have CHANGED   Details  predniSONE (DELTASONE) 10 MG tablet Take 40mg  po daily for 2 days then 30mg  daily for 2 days then 20mg  daily for 2 days then 10mg  daily for 2 days then stop Qty: 20 tablet, Refills: 0   Associated Diagnoses: COPD exacerbation ([place])      CONTINUE these medications which have NOT CHANGED   Details  albuterol (PROVENTIL HFA;VENTOLIN HFA) 108 (90 BASE) MCG/ACT inhaler Inhale 1-2 puffs into the lungs every 6 (six) hours as needed for wheezing or shortness of breath. Qty: 1 Inhaler, Refills: 3    albuterol (PROVENTIL) (2.5 MG/3ML) 0.083% nebulizer solution Take 3 mLs (2.5 mg total) by nebulization every 4 (four) hours as needed for wheezing or shortness of breath. Qty: 75 mL, Refills: 12   Associated Diagnoses: COPD exacerbation (HCC)    budesonide-formoterol (SYMBICORT) 160-4.5 MCG/ACT inhaler Inhale 2 puffs into the lungs 2 (two) times daily. Qty: 1 Inhaler, Refills: 6    cyanocobalamin 2000 MCG tablet Take 2,000 mcg by mouth daily.    losartan (COZAAR) 50 MG tablet Take 1 tablet (50 mg total) by mouth daily. Qty: 90 tablet, Refills: 3   Associated Diagnoses: Benign essential HTN    Multiple Vitamin (MULTIVITAMIN) tablet Take 1 tablet by mouth daily.    pyridoxine (B-6) 100 MG tablet Take 100 mg by mouth daily.    Umeclidinium Bromide (INCRUSE ELLIPTA) 62.5 MCG/INH AEPB Inhale 1 Inhaler into the lungs daily.       Allergies  Allergen Reactions  . Aspirin Other (See Comments)    Upset stomach.  . Other Hypertension    Hazelnuts  . Penicillins Hives    Has patient had a PCN reaction causing immediate rash, facial/tongue/throat swelling, SOB or lightheadedness with hypotension: Yes Has patient had a PCN reaction causing severe rash involving mucus membranes or skin necrosis:  No Has patient had a PCN reaction that required hospitalization No Has patient had a PCN reaction occurring within the last 10 years: No If all of the above answers are "NO", then may proceed with Cephalosporin use.   . Sulfa Antibiotics Hives      The results of significant diagnostics from this hospitalization (including imaging, microbiology, ancillary and laboratory) are listed below for reference.    Significant Diagnostic Studies: Dg Chest 2 View  01/21/2016  CLINICAL DATA:  Acute onset of productive cough, worsening shortness of breath and lower chest pain. Indigestion. Initial encounter. EXAM: CHEST  2 VIEW COMPARISON:  Chest radiograph performed 03/14/2015, and CT of the chest performed 04/05/2015 FINDINGS: The lungs are well-aerated. Peribronchial thickening is noted. Mild bilateral central airspace opacities could reflect mild pneumonia, given the patient's symptoms. No pleural effusion or pneumothorax is seen. A right-sided nipple shadow is noted. The heart is normal in size; the mediastinal contour is within normal limits. No acute osseous abnormalities are seen. IMPRESSION: Peribronchial thickening noted. Mild bilateral central airspace opacities could reflect mild pneumonia, given the patient's symptoms.  Electronically Signed   By: Garald Balding M.D.   On: 01/21/2016 23:35    Microbiology: Recent Results (from the past 240 hour(s))  Culture, blood (routine x 2) Call MD if unable to obtain prior to antibiotics being given     Status: None (Preliminary result)   Collection Time: 01/22/16 12:20 PM  Result Value Ref Range Status   Specimen Description BLOOD LEFT ANTECUBITAL  Final   Special Requests   Final    BOTTLES DRAWN AEROBIC AND ANAEROBIC AEB=8CC ANA=5CC   Culture NO GROWTH 3 DAYS  Final   Report Status PENDING  Incomplete  MRSA PCR Screening     Status: None   Collection Time: 01/22/16  1:30 PM  Result Value Ref Range Status   MRSA by PCR NEGATIVE NEGATIVE Final     Comment:        The GeneXpert MRSA Assay (FDA approved for NASAL specimens only), is one component of a comprehensive MRSA colonization surveillance program. It is not intended to diagnose MRSA infection nor to guide or monitor treatment for MRSA infections.   Culture, sputum-assessment     Status: None   Collection Time: 01/22/16  5:00 PM  Result Value Ref Range Status   Specimen Description SPUTUM EXPECTORATED  Final   Special Requests NONE  Final   Sputum evaluation   Final    MICROSCOPIC FINDINGS SUGGEST THAT THIS SPECIMEN IS NOT REPRESENTATIVE OF LOWER RESPIRATORY SECRETIONS. PLEASE RECOLLECT. Gram Stain Report Called to,Read Back By and Verified With: SHERRY HANOCK,RN @0050  01/23/16 MKELLY Performed at [facility]    Report Status 01/23/2016 FINAL  Final  Culture, expectorated sputum-assessment     Status: None   Collection Time: 01/23/16  1:30 PM  Result Value Ref Range Status   Specimen Description SPUTUM EXPECTORATED  Final   Special Requests NONE  Final   Sputum evaluation   Final    THIS SPECIMEN IS ACCEPTABLE FOR SPUTUM CULTURE Performed at [facility]    Report Status 01/24/2016 FINAL  Final     Labs: Basic Metabolic Panel:  Recent Labs Lab 01/22/16 0012 01/24/16 0434 01/25/16 0637  NA 139 141 142  K 4.4 3.8 3.8  CL 99* 110 104  CO2 29 25 27   GLUCOSE 216* 162* 186*  BUN 21* 24* 24*  CREATININE 1.12 0.80 0.85  CALCIUM 9.8 7.9* 8.5*  MG 2.1  --   --   PHOS 3.1  --   --    Liver Function Tests:  Recent Labs Lab 01/22/16 0012  AST 29  ALT 40  ALKPHOS 58  BILITOT 0.9  PROT 7.4  ALBUMIN 3.9    Recent Labs Lab 01/22/16 0012  LIPASE 17   CBC:  Recent Labs Lab 01/22/16 0012 01/24/16 0434  WBC 30.5* 13.2*  NEUTROABS 27.4*  --   HGB 16.6 12.4*  HCT 48.2 38.5*  MCV 91.3 92.3  PLT 288 200   Cardiac Enzymes:  Recent Labs Lab 01/22/16 0012  TROPONINI <0.03   BNP: BNP (last 3 results)  Recent Labs   01/22/16 0012  BNP 64.0   CBG:  Recent Labs Lab 01/24/16 0754 01/24/16 1117 01/24/16 1616 01/24/16 2045 01/25/16 0749  GLUCAP 154* 209* 239* 216* 162*       Signed:  Kathie Dike, MD [facility] 01/25/2016, 8:21 AM    By signing my name below, I, Rennis Harding, attest that this documentation has been prepared under the direction and in the presence of [employer],  MD. Electronically signed: Rennis Harding, Scribe. 01/25/2016 1:16pm   I, Dr. Kathie Dike, personally performed the services described in this documentaiton. All medical record entries made by the scribe were at my direction and in my presence. I have reviewed the chart and agree that the record reflects my personal performance and is accurate and complete  Kathie Dike, MD, 01/25/2016 1:29 PM

## 2016-01-25 NOTE — Progress Notes (Signed)
Inpatient Diabetes Program Recommendations  AACE/ADA: New Consensus Statement on Inpatient Glycemic Control (2015)  Target Ranges:  Prepandial:   less than 140 mg/dL      Peak postprandial:   less than 180 mg/dL (1-2 hours)      Critically ill patients:  140 - 180 mg/dL  Results for NIJEE, FRISBEY (MRN [id number]) as of 01/25/2016 08:06  Ref. Range 01/24/2016 07:54 01/24/2016 11:17 01/24/2016 16:16 01/24/2016 20:45 01/25/2016 07:49  Glucose-Capillary Latest Ref Range: 65-99 mg/dL 154 (H) 209 (H) 239 (H) 216 (H) 162 (H)   Review of Glycemic Control  Current orders for Inpatient glycemic control: Levemir 5 units daily, Novolog 0-15 units TID with meals  Inpatient Diabetes Program Recommendations: Insulin - Meal Coverage: If steroids are continued as ordered (Solumedrol 60 mg Q12H), please consider ordering Novolog 3 units TID with meals for meal coverage. Please note that if meal coverage is ordered as recommended, it will likely need to discontinued once steroids are stopped.  Thanks, Barnie Alderman, RN, MSN, CDE Diabetes Coordinator Inpatient Diabetes Program [phone] (Team Pager from [place] to [place]) [phone] ([facility] office) [phone] [facility] office) [phone] [facility] office)

## 2016-01-25 NOTE — Care Management Important Message (Signed)
Important Message  Patient Details  Name: Michael Evans MRN: [id number] Date of Birth: [birth year]   Medicare Important Message Given:  Yes    Alvie Heidelberg, RN 01/25/2016, 1:46 PM

## 2016-01-25 NOTE — Care Management Note (Signed)
Case Management Note  Patient Details  Name: Michael Evans MRN: [id number] Date of Birth: [birth year]  Subjective/Objective:     Spoke with patient for discharge planning. Patient is from home and has O2 provided [facility]. [place] is in the room. Patient stated that he drives him self to grocery store, and Dr Appointments. He is independent and was up in the room without assistance when I came by to talk with him. He would not meet homebound requirment for H H PT. Patient stated that he doesn't feel PT necessary.  No CM needs identified.               Action/Plan:  Home with self care.  Expected Discharge Date:                  Expected Discharge Plan:  Home/Self Care  In-House Referral:     Discharge [place] not met per provider  Post Acute Care Choice:    Choice offered to:     DME Arranged:    DME Agency:     HH Arranged:    HH Agency:     Status of Service:  Completed, signed off  Medicare Important Message Given:  Yes Date Medicare IM Given:    Medicare IM give by:    Date Additional Medicare IM Given:    Additional Medicare Important Message give by:     If discussed at [place] of Stay Meetings, dates discussed:    Additional Comments:  Alvie Heidelberg, RN 01/25/2016, 1:47 PM

## 2016-01-27 LAB — CULTURE, RESPIRATORY

## 2016-01-27 LAB — CULTURE, BLOOD (ROUTINE X 2): CULTURE: NO GROWTH

## 2016-01-27 LAB — CULTURE, RESPIRATORY W GRAM STAIN: Culture: NORMAL

## 2016-01-28 LAB — LEGIONELLA ANTIGEN, URINE

## 2016-01-29 ENCOUNTER — Encounter: Payer: Medicare Other | Admitting: Family Medicine

## 2016-02-08 ENCOUNTER — Encounter: Payer: Self-pay | Admitting: Family Medicine

## 2016-02-08 ENCOUNTER — Ambulatory Visit (INDEPENDENT_AMBULATORY_CARE_PROVIDER_SITE_OTHER): Payer: Medicare Other | Admitting: Family Medicine

## 2016-02-08 VITALS — BP 108/70 | HR 86 | Temp 98.4°F | Resp 18 | Ht 65.0 in | Wt 153.0 lb

## 2016-02-08 DIAGNOSIS — J441 Chronic obstructive pulmonary disease with (acute) exacerbation: Secondary | ICD-10-CM | POA: Diagnosis not present

## 2016-02-08 DIAGNOSIS — J438 Other emphysema: Secondary | ICD-10-CM | POA: Diagnosis not present

## 2016-02-08 DIAGNOSIS — Z09 Encounter for follow-up examination after completed treatment for conditions other than malignant neoplasm: Secondary | ICD-10-CM

## 2016-02-08 MED ORDER — BUDESONIDE-FORMOTEROL FUMARATE 160-4.5 MCG/ACT IN AERO: 2.0000 | INHALATION_SPRAY | Freq: Two times a day (BID) | RESPIRATORY_TRACT | Status: DC

## 2016-02-08 NOTE — Progress Notes (Signed)
Subjective:    Patient ID: Michael Evans, male    DOB: 10-19-1941, 75 y.o.   MRN: [id number]  HPI Patient was recently admitted to the hospital with influenza, PNA, and copd exacerbation.  I have copied relevant portions of the DC summary below: Admit date: 01/22/2016 Discharge date: 01/25/2016  Time spent: 35 minutes  Recommendations for Outpatient Follow-up:  1. Follow-up with PCP within 1-2 weeks for resolution of respiratory failure and influenza.  2. Discharge home with [place].   Discharge Diagnoses:  Principal Problem:  Acute on chronic respiratory failure with hypoxia (HCC) Active Problems:  Essential hypertension, benign  OSA (obstructive sleep apnea)  Community acquired pneumonia  COPD with exacerbation (HCC)  SIRS (systemic inflammatory response syndrome) (HCC)  Leukocytosis  Hyperglycemia  Respiratory failure ([place])  Influenza A   Discharge Condition: Improved   Diet recommendation: Heart healthy   Filed Weights   01/22/16 1329 01/23/16 0550 01/24/16 0500  Weight: 67.2 kg (148 lb 2.4 oz) 67.7 kg (149 lb 4 oz) 71.5 kg (157 lb 10.1 oz)    History of present illness:  75 y.o. male with a past medical history of hypertension, obstructive sleep apnea, COPD with chronic respiratory failure (chronically on 2 L) and GERD; presented to the ED with complaints of worsening shortness of breath and abdominal pain. He was seen by his PCP and was started on prednisone tapering for COPD exacerbation. In the last 24-40 hours patient has had even worsening shortness of breath, with associated chills, productive cough (yellow sputum, no blood) and associated bilateral abdominal discomfort from coughing. He also reports decreased appetite and general malaise.In the ED CXR demonstrated peribronchial thickening with mild bilateral central airspace opacities suggesting pneumonia; he was also found to have a heart rate up to 140, a RR of 33, WBC's of 30.5, CBG's of  216 (without history of diabetes) and low-grade fever with temp max of 99.4. [facility] hospitalist was called to admit the patient for further evaluation and treatment.  Hospital Course:  On admission patient noted to be in acute on chronic hypoxemic respiratory failure, which was multifactorial related to flu, PNA, and COPD. O2 sat 85% on presentation. CXR on admission revealed possible PNA. Uses 2L O2 at home, yet requiring 3L on admission. Was started on IV abx, steroids and nebs with improvement in symptoms. On discharge was able to wean down to baseline requirement of 2L. He was also transitioned to oral abx and steroid taper on discharge.  1. CAP, CXR revealed possible PNA. Transitioned to oral abx on discharge. 2. Influenza, labs positive for influenza A. continue Tamiflu PO twice daily. 3. Sepsis, secondary to influenza and PNA. Resolved. Lactic acid 4.5. WBC was 30.5 on admission. WBC improved and he remained afebrile. O admission started on IV levaquin and IV fluids per sepsis protocol. BC were unremarkable.  4. COPD exacerbation, likely precipitated by infection. Requires 2L O2 at baseline. On discharge continue steroid taper, oral abx, and NEB PRN.  5. DM type 2, continue Levemir SQ daily with sliding scale coverage  Procedures:  None  Consultations:  PT- HHPT  OT- No follow up needed.  Discharge Exam: Filed Vitals:   01/25/16 0400 01/25/16 0613  BP:  103/78  Pulse: 68 98  Temp:  98 F (36.7 C)  Resp:  20    General: NAD, looks comfortable Cardiovascular: RRR, S1, S2  Respiratory: Diminished breath sounds bilaterally, No wheezing, rales or rhonchi. Able to speak in full sentences. Abdomen: soft, non tender,  no distention , bowel sounds normal Musculoskeletal: No edema b/l  Discharge Instructions    Current Discharge Medication List    START taking these medications   Details  guaiFENesin (MUCINEX) 600 MG 12 hr tablet Take 1 tablet (600 mg  total) by mouth 2 (two) times daily. Qty: 30 tablet, Refills: 0    levofloxacin (LEVAQUIN) 750 MG tablet Take 1 tablet (750 mg total) by mouth daily. Qty: 4 tablet, Refills: 0    oseltamivir (TAMIFLU) 75 MG capsule Take 1 capsule (75 mg total) by mouth 2 (two) times daily. Qty: 4 capsule, Refills: 0      CONTINUE these medications which have CHANGED   Details  predniSONE (DELTASONE) 10 MG tablet Take 40mg  po daily for 2 days then 30mg  daily for 2 days then 20mg  daily for 2 days then 10mg  daily for 2 days then stop Qty: 20 tablet, Refills: 0   Associated Diagnoses: COPD exacerbation ([place])      CONTINUE these medications which have NOT CHANGED   Details  albuterol (PROVENTIL HFA;VENTOLIN HFA) 108 (90 BASE) MCG/ACT inhaler Inhale 1-2 puffs into the lungs every 6 (six) hours as needed for wheezing or shortness of breath. Qty: 1 Inhaler, Refills: 3    albuterol (PROVENTIL) (2.5 MG/3ML) 0.083% nebulizer solution Take 3 mLs (2.5 mg total) by nebulization every 4 (four) hours as needed for wheezing or shortness of breath. Qty: 75 mL, Refills: 12   Associated Diagnoses: COPD exacerbation (HCC)    budesonide-formoterol (SYMBICORT) 160-4.5 MCG/ACT inhaler Inhale 2 puffs into the lungs 2 (two) times daily. Qty: 1 Inhaler, Refills: 6    cyanocobalamin 2000 MCG tablet Take 2,000 mcg by mouth daily.    losartan (COZAAR) 50 MG tablet Take 1 tablet (50 mg total) by mouth daily. Qty: 90 tablet, Refills: 3   Associated Diagnoses: Benign essential HTN    Multiple Vitamin (MULTIVITAMIN) tablet Take 1 tablet by mouth daily.    pyridoxine (B-6) 100 MG tablet Take 100 mg by mouth daily.    Umeclidinium Bromide (INCRUSE ELLIPTA) 62.5 MCG/INH AEPB Inhale 1 Inhaler into the lungs daily.         patient is here today for hospital follow-up. He states that his breathing is approximately 50% better. He denies any fever. He does continue to complain of  chest congestion and difficulty breaking up and coughing up the mucus. At present he is using incruse  On a daily basis however he has not been compliant with Symbicort. I am not sure if it's due to cost or confusion but he has not been taking this medicine every day. At the present time he denies any chest pain. Shortness of breath is at baseline. He does report dyspnea on exertion and fatigue however he is recovering from a serious illness. Past Medical History  Diagnosis Date  . COPD (chronic obstructive pulmonary disease) (HCC)     PFT 06-26-09 FEV1  1.75( 71%), FVC 3.65( 101%), FEV1% 48, TLC 5.53(104%), DLCO 55%, no BD  . Arthritis   . Hypertension   . Nasal septal deviation   . Chronic rhinitis   . OSA (obstructive sleep apnea) 04/21/2011  . Nocturia   . Skin cancer    Past Surgical History  Procedure Laterality Date  . Back surgery    . Appendectomy    . Cataract extraction w/phaco  01/06/2013    Procedure: CATARACT EXTRACTION PHACO AND INTRAOCULAR LENS PLACEMENT (IOC);  Surgeon: Tonny Branch, MD;  Location: [facility];  Service: Ophthalmology;  Laterality: Right;  CDE: 22.17   Current Outpatient Prescriptions on File Prior to Visit  Medication Sig Dispense Refill  . albuterol (PROVENTIL HFA;VENTOLIN HFA) 108 (90 BASE) MCG/ACT inhaler Inhale 1-2 puffs into the lungs every 6 (six) hours as needed for wheezing or shortness of breath. 1 Inhaler 3  . albuterol (PROVENTIL) (2.5 MG/3ML) 0.083% nebulizer solution Take 3 mLs (2.5 mg total) by nebulization every 4 (four) hours as needed for wheezing or shortness of breath. 75 mL 12  . cyanocobalamin 2000 MCG tablet Take 2,000 mcg by mouth daily.    Marland Kitchen guaiFENesin (MUCINEX) 600 MG 12 hr tablet Take 1 tablet (600 mg total) by mouth 2 (two) times daily. 30 tablet 0  . losartan (COZAAR) 50 MG tablet Take 1 tablet (50 mg total) by mouth daily. 90 tablet 3  . Multiple Vitamin (MULTIVITAMIN) tablet Take 1 tablet by mouth daily.    Marland Kitchen pyridoxine (B-6) 100 MG  tablet Take 100 mg by mouth daily.    Marland Kitchen Umeclidinium Bromide (INCRUSE ELLIPTA) 62.5 MCG/INH AEPB Inhale 1 Inhaler into the lungs daily.     No current facility-administered medications on file prior to visit.   Allergies  Allergen Reactions  . Aspirin Other (See Comments)    Upset stomach.  . Other Hypertension    Hazelnuts  . Penicillins Hives    Has patient had a PCN reaction causing immediate rash, facial/tongue/throat swelling, SOB or lightheadedness with hypotension: Yes Has patient had a PCN reaction causing severe rash involving mucus membranes or skin necrosis: No Has patient had a PCN reaction that required hospitalization No Has patient had a PCN reaction occurring within the last 10 years: No If all of the above answers are "NO", then may proceed with Cephalosporin use.   . Sulfa Antibiotics Hives   Social History   Social History  . Marital Status: Divorced    Spouse Name: N/A  . Number of Children: N/A  . 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 Smoking status: Former Smoker -- 1.50 packs/day for 54 years    Types: Cigarettes    Quit date: 12/01/2001  . Smokeless tobacco: Never Used  . Alcohol Use: No  . Drug Use: No  . Sexual Activity: Not on file   Other Topics Concern  . Not on file   Social History Narrative     Review of Systems  All other systems reviewed and are negative.      Objective:   Physical Exam  Constitutional: He appears well-developed and well-nourished.  HENT:  Head: Normocephalic and atraumatic.  Mouth/Throat: Oropharynx is clear and moist.  Neck: Neck supple.  Cardiovascular: Normal rate, regular rhythm and normal heart sounds.   Pulmonary/Chest: He has decreased breath sounds in the right upper field, the right lower field, the left upper field and the left lower field. He has no wheezes. He has no rales.  Abdominal: Soft. Bowel sounds are normal.  Musculoskeletal: He  exhibits no edema.  Lymphadenopathy:    He has no cervical adenopathy.  Vitals reviewed.         Assessment & Plan:  COPD exacerbation ([place]) - Plan: budesonide-formoterol (SYMBICORT) 160-4.5 MCG/ACT inhaler   Patient COPD exacerbation appears to be resolving. I reviewed his x-rays from the hospital there is questionable peribronchial pneumonia. He has completed the Levaquin. He is afebrile and feeling better and therefore I do not feel further antibiotics are necessary.  I believe the patient mainly was having a COPD exacerbation likely triggered and complicated by influenza. From a standpoint he seems to be better. Therefore I will focus my intention on trying to prevent this from occurring again. I spent 20 minutes explaining to the patient the necessity of taking both the incruse AND symbicort  On a daily basis to try to help prevent the attacks and manage his breathing. He can use his nebulizers as needed for rescue medicine. He needs to stay on these 2 medicines all the time. Furthermore I would also continue Mucinex on a daily basis to try to help treat  Constant underlying chest congestion he develops. Recheck his breathing in one month. Consider leaving the patient on a standing dose of low-dose prednisone such as 5-10 mg a day if breathing continues to worsen

## 2016-07-11 ENCOUNTER — Telehealth: Payer: Self-pay | Admitting: Family Medicine

## 2016-07-11 NOTE — Telephone Encounter (Signed)
Brother needed to make appt for patient and it has been done

## 2016-07-15 ENCOUNTER — Encounter: Payer: Self-pay | Admitting: Family Medicine

## 2016-07-15 ENCOUNTER — Ambulatory Visit (INDEPENDENT_AMBULATORY_CARE_PROVIDER_SITE_OTHER): Payer: Medicare Other | Admitting: Family Medicine

## 2016-07-15 VITALS — BP 190/100 | HR 62 | Temp 98.1°F | Resp 22 | Ht 65.0 in | Wt 164.0 lb

## 2016-07-15 DIAGNOSIS — I10 Essential (primary) hypertension: Secondary | ICD-10-CM

## 2016-07-15 DIAGNOSIS — R0902 Hypoxemia: Secondary | ICD-10-CM

## 2016-07-15 DIAGNOSIS — J438 Other emphysema: Secondary | ICD-10-CM | POA: Diagnosis not present

## 2016-07-15 LAB — CBC WITH DIFFERENTIAL/PLATELET
BASOS PCT: 0 %
Basophils Absolute: 0 cells/uL (ref 0–200)
EOS PCT: 3 %
Eosinophils Absolute: 168 cells/uL (ref 15–500)
HCT: 44.8 % (ref 38.5–50.0)
Hemoglobin: 14.8 g/dL (ref 13.0–17.0)
Lymphs Abs: 2240 cells/uL (ref 850–3900)
MCH: 29 pg (ref 27.0–33.0)
MCHC: 33 g/dL (ref 32.0–36.0)
MCV: 87.7 fL (ref 80.0–100.0)
MONOS PCT: 13 %
MPV: 10.3 fL (ref 7.5–12.5)
Monocytes Absolute: 728 cells/uL (ref 200–950)
Neutro Abs: 2464 cells/uL (ref 1500–7800)
Neutrophils Relative %: 44 %
PLATELETS: 231 10*3/uL (ref 140–400)
RBC: 5.11 MIL/uL (ref 4.20–5.80)
RDW: 14.4 % (ref 11.0–15.0)
WBC: 5.6 10*3/uL (ref 3.8–10.8)

## 2016-07-15 LAB — COMPLETE METABOLIC PANEL WITH GFR
ALT: 20 U/L (ref 9–46)
AST: 21 U/L (ref 10–35)
Albumin: 4 g/dL (ref 3.6–5.1)
Alkaline Phosphatase: 47 U/L (ref 40–115)
BILIRUBIN TOTAL: 0.4 mg/dL (ref 0.2–1.2)
BUN: 10 mg/dL (ref 7–25)
CO2: 29 mmol/L (ref 20–31)
Calcium: 9.3 mg/dL (ref 8.6–10.3)
Chloride: 103 mmol/L (ref 98–110)
Creat: 0.86 mg/dL (ref 0.70–1.18)
GFR, EST NON AFRICAN AMERICAN: 84 mL/min (ref >=60)
GFR, Est African American: >89 mL/min (ref >=60)
GLUCOSE: 109 mg/dL — AB (ref 70–99)
Potassium: 4.8 mmol/L (ref 3.5–5.3)
SODIUM: 139 mmol/L (ref 135–146)
TOTAL PROTEIN: 6.3 g/dL (ref 6.1–8.1)

## 2016-07-15 MED ORDER — AMLODIPINE BESYLATE 10 MG PO TABS: 10.0000 mg | ORAL_TABLET | Freq: Every day | ORAL | 3 refills | Status: DC

## 2016-07-15 NOTE — Progress Notes (Signed)
Subjective:    Patient ID: Michael Evans, male    DOB: [birth year], 75 y.o.   MRN: [id number]  HPI Patient is a very pleasant 75 year old white male with a history of COPD with chronic oxygen dependence due to hypoxemia and chronic respiratory failure. He is currently on oxygen 2 L via nasal cannula. On room air he is 97% on 2 L. With ambulation he is 97% on 2 L with ambulation off of oxygen, his pulse oximetry drops to 87%. He reports dyspnea on exertion. He reports dyspnea at rest. He has a chronic cough productive of clear mucus. He is currently on Symbicort which he uses every day. He is not taking any acting anti-cholinergic bronchodilator.  He denies any hemoptysis. He denies any chest pain. His blood pressure significantly elevated today. I rechecked his blood pressure myself and confirmed.pmh .ps h Current Outpatient Prescriptions on File Prior to Visit  Medication Sig Dispense Refill  . albuterol (PROVENTIL HFA;VENTOLIN HFA) 108 (90 BASE) MCG/ACT inhaler Inhale 1-2 puffs into the lungs every 6 (six) hours as needed for wheezing or shortness of breath. 1 Inhaler 3  . albuterol (PROVENTIL) (2.5 MG/3ML) 0.083% nebulizer solution Take 3 mLs (2.5 mg total) by nebulization every 4 (four) hours as needed for wheezing or shortness of breath. 75 mL 12  . budesonide-formoterol (SYMBICORT) 160-4.5 MCG/ACT inhaler Inhale 2 puffs into the lungs 2 (two) times daily. 1 Inhaler 6  . cyanocobalamin 2000 MCG tablet Take 2,000 mcg by mouth daily.    Marland Kitchen guaiFENesin (MUCINEX) 600 MG 12 hr tablet Take 1 tablet (600 mg total) by mouth 2 (two) times daily. 30 tablet 0  . losartan (COZAAR) 50 MG tablet Take 1 tablet (50 mg total) by mouth daily. 90 tablet 3  . Multiple Vitamin (MULTIVITAMIN) tablet Take 1 tablet by mouth daily.    Marland Kitchen pyridoxine (B-6) 100 MG tablet Take 100 mg by mouth daily.     No current facility-administered medications on file prior to visit.    Allergies  Allergen Reactions  . Aspirin  Other (See Comments)    Upset stomach.  . Other Hypertension    Hazelnuts  . Penicillins Hives    Has patient had a PCN reaction causing immediate rash, facial/tongue/throat swelling, SOB or lightheadedness with hypotension: Yes Has patient had a PCN reaction causing severe rash involving mucus membranes or skin necrosis: No Has patient had a PCN reaction that required hospitalization No Has patient had a PCN reaction occurring within the last 10 years: No If all of the above answers are "NO", then may proceed with Cephalosporin use.   . Sulfa Antibiotics Hives   Social History   Social History  . Marital status: Divorced    Spouse name: N/A  . Number of children: N/A  . Years of education: N/A   Occupational History  . 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 Smoking status: Former Smoker    Packs/day: 1.50    Years: 54.00    Types: Cigarettes    Quit date: 12/01/2001  . Smokeless tobacco: Never Used  . Alcohol use No  . Drug use: No  . Sexual activity: Not on file   Other Topics Concern  . Not on file   Social History Narrative  . No narrative on file   .   Review of Systems  All other systems reviewed and are negative.      Objective:   Physical Exam  Neck: Neck supple.  No JVD present.  Cardiovascular: Normal rate, regular rhythm and normal heart sounds.   Pulmonary/Chest: Effort normal. He has decreased breath sounds. He has wheezes. He has no rhonchi. He has no rales.  Abdominal: Soft. Bowel sounds are normal.  Musculoskeletal: He exhibits no edema.  Vitals reviewed.         Assessment & Plan:  Benign essential HTN - Plan: amLODipine (NORVASC) 10 MG tablet, CBC with Differential/Platelet, COMPLETE METABOLIC PANEL WITH GFR  Other emphysema (HCC)  Hypoxemia  Add Spriva to 2.5 g 2 inhalations daily. Continue Symbicort. Continue oxygen 2 L via nasal cannula. Add amlodipine 10 mg by mouth daily. Recheck blood pressure in 2 weeks. Check  CBC as well as CMP.

## 2016-07-23 ENCOUNTER — Encounter: Payer: Self-pay | Admitting: Family Medicine

## 2016-08-15 ENCOUNTER — Ambulatory Visit (INDEPENDENT_AMBULATORY_CARE_PROVIDER_SITE_OTHER): Payer: Medicare Other | Admitting: Family Medicine

## 2016-08-15 ENCOUNTER — Encounter: Payer: Self-pay | Admitting: Family Medicine

## 2016-08-15 VITALS — BP 162/88 | HR 80 | Temp 98.2°F | Resp 20 | Ht 65.0 in | Wt 165.0 lb

## 2016-08-15 DIAGNOSIS — Z23 Encounter for immunization: Secondary | ICD-10-CM | POA: Diagnosis not present

## 2016-08-15 DIAGNOSIS — J441 Chronic obstructive pulmonary disease with (acute) exacerbation: Secondary | ICD-10-CM

## 2016-08-15 DIAGNOSIS — I10 Essential (primary) hypertension: Secondary | ICD-10-CM | POA: Diagnosis not present

## 2016-08-15 MED ORDER — HYDROCHLOROTHIAZIDE 25 MG PO TABS: 25.0000 mg | ORAL_TABLET | Freq: Every day | ORAL | 3 refills | Status: DC

## 2016-08-15 MED ORDER — AZITHROMYCIN 250 MG PO TABS: ORAL_TABLET | ORAL | 0 refills | Status: DC

## 2016-08-15 NOTE — Addendum Note (Signed)
Addended by: Shary Decamp B on: 08/15/2016 11:40 AM   Modules accepted: Orders

## 2016-08-15 NOTE — Progress Notes (Signed)
Subjective:    Patient ID: Michael Evans, male    DOB: 11/30/1941, 75 y.o.   MRN: [id number]  HPI  07/15/16 Patient is a very pleasant 75 year old white male with a history of COPD with chronic oxygen dependence due to hypoxemia and chronic respiratory failure. He is currently on oxygen 2 L via nasal cannula. On room air he is 97% on 2 L. With ambulation he is 97% on 2 L with ambulation off of oxygen, his pulse oximetry drops to 87%. He reports dyspnea on exertion. He reports dyspnea at rest. He has a chronic cough productive of clear mucus. He is currently on Symbicort which he uses every day. He is not taking any acting anti-cholinergic bronchodilator.  He denies any hemoptysis. He denies any chest pain. His blood pressure significantly elevated today. I rechecked his blood pressure myself and confirmed.  At that time, my plan was: Add Spriva to 2.5 g 2 inhalations daily. Continue Symbicort. Continue oxygen 2 L via nasal cannula. Add amlodipine 10 mg by mouth daily. Recheck blood pressure in 2 weeks. Check CBC as well as CMP.  08/15/16 His blood pressure is better today although still significantly elevated. He also has more chest congestion than previously on his exam. He has decreased breath sounds bilaterally. He has faint expiratory wheezes but he has rhonchorous breath sounds in the posterior left upper and left lower lobes. He states that he feels that his breathing is getting slightly worse. He is taking spree and Symbicort Past Medical History:  Diagnosis Date  . Arthritis   . Chronic rhinitis   . COPD (chronic obstructive pulmonary disease) (HCC)    PFT 06-26-09 FEV1  1.75( 71%), FVC 3.65( 101%), FEV1% 48, TLC 5.53(104%), DLCO 55%, no BD  . Hypertension   . Nasal septal deviation   . Nocturia   . OSA (obstructive sleep apnea) 04/21/2011  . Skin cancer    Past Surgical History:  Procedure Laterality Date  . APPENDECTOMY    . BACK SURGERY    . CATARACT EXTRACTION W/PHACO  01/06/2013     Procedure: CATARACT EXTRACTION PHACO AND INTRAOCULAR LENS PLACEMENT (IOC);  Surgeon: Tonny Branch, MD;  Location: [facility];  Service: Ophthalmology;  Laterality: Right;  CDE: 22.17    Current Outpatient Prescriptions on File Prior to Visit  Medication Sig Dispense Refill  . albuterol (PROVENTIL HFA;VENTOLIN HFA) 108 (90 BASE) MCG/ACT inhaler Inhale 1-2 puffs into the lungs every 6 (six) hours as needed for wheezing or shortness of breath. 1 Inhaler 3  . albuterol (PROVENTIL) (2.5 MG/3ML) 0.083% nebulizer solution Take 3 mLs (2.5 mg total) by nebulization every 4 (four) hours as needed for wheezing or shortness of breath. 75 mL 12  . amLODipine (NORVASC) 10 MG tablet Take 1 tablet (10 mg total) by mouth daily. 90 tablet 3  . budesonide-formoterol (SYMBICORT) 160-4.5 MCG/ACT inhaler Inhale 2 puffs into the lungs 2 (two) times daily. 1 Inhaler 6  . cyanocobalamin 2000 MCG tablet Take 2,000 mcg by mouth daily.    Marland Kitchen losartan (COZAAR) 50 MG tablet Take 1 tablet (50 mg total) by mouth daily. 90 tablet 3  . Multiple Vitamin (MULTIVITAMIN) tablet Take 1 tablet by mouth daily.    Marland Kitchen pyridoxine (B-6) 100 MG tablet Take 100 mg by mouth daily.     No current facility-administered medications on file prior to visit.    Allergies  Allergen Reactions  . Aspirin Other (See Comments)    Upset stomach.  . Other Hypertension  Hazelnuts  . Penicillins Hives    Has patient had a PCN reaction causing immediate rash, facial/tongue/throat swelling, SOB or lightheadedness with hypotension: Yes Has patient had a PCN reaction causing severe rash involving mucus membranes or skin necrosis: No Has patient had a PCN reaction that required hospitalization No Has patient had a PCN reaction occurring within the last 10 years: No If all of the above answers are "NO", then may proceed with Cephalosporin use.   . Sulfa Antibiotics Hives   Social History   Social History  . Marital status: Divorced    Spouse name: N/A   . Number of children: N/A  . Years of education: N/A   Occupational History  . 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 Smoking status: Former Smoker    Packs/day: 1.50    Years: 54.00    Types: Cigarettes    Quit date: 12/01/2001  . Smokeless tobacco: Never Used  . Alcohol use No  . Drug use: No  . Sexual activity: Not on file   Other Topics Concern  . Not on file   Social History Narrative  . No narrative on file   .   Review of Systems  All other systems reviewed and are negative.      Objective:   Physical Exam  Neck: Neck supple. No JVD present.  Cardiovascular: Normal rate, regular rhythm and normal heart sounds.   Pulmonary/Chest: Effort normal. He has decreased breath sounds. He has wheezes. He has no rhonchi. He has no rales.  Abdominal: Soft. Bowel sounds are normal.  Musculoskeletal: He exhibits no edema.  Vitals reviewed.         Assessment & Plan:  Benign essential HTN - Plan: hydrochlorothiazide (HYDRODIURIL) 25 MG tablet  COPD exacerbation (HCC) - Plan: azithromycin (ZITHROMAX) 250 MG tablet Patient will get his flu shot today. I will add hydrochlorothiazide 25 mg a day for hypertension. Hopefully this will give [place] posterior to our goal of 140/90 or less. I will start a Z-Pak today to try to nip this in the bud both as an antibiotic and is also an immunomodulator.

## 2016-08-29 ENCOUNTER — Encounter: Payer: Self-pay | Admitting: Family Medicine

## 2016-08-29 ENCOUNTER — Ambulatory Visit (INDEPENDENT_AMBULATORY_CARE_PROVIDER_SITE_OTHER): Payer: Medicare Other | Admitting: Family Medicine

## 2016-08-29 VITALS — BP 136/90 | HR 7 | Temp 97.8°F | Resp 20 | Ht 65.0 in | Wt 164.0 lb

## 2016-08-29 DIAGNOSIS — I10 Essential (primary) hypertension: Secondary | ICD-10-CM | POA: Diagnosis not present

## 2016-08-29 DIAGNOSIS — D485 Neoplasm of uncertain behavior of skin: Secondary | ICD-10-CM | POA: Diagnosis not present

## 2016-08-29 NOTE — Progress Notes (Signed)
Subjective:    Patient ID: Michael Evans, male    DOB: 12-Oct-1941, 75 y.o.   MRN: [id number]  HPI  07/15/16 Patient is a very pleasant 75 year old white male with a history of COPD with chronic oxygen dependence due to hypoxemia and chronic respiratory failure. He is currently on oxygen 2 L via nasal cannula. On room air he is 97% on 2 L. With ambulation he is 97% on 2 L with ambulation off of oxygen, his pulse oximetry drops to 87%. He reports dyspnea on exertion. He reports dyspnea at rest. He has a chronic cough productive of clear mucus. He is currently on Symbicort which he uses every day. He is not taking any acting anti-cholinergic bronchodilator.  He denies any hemoptysis. He denies any chest pain. His blood pressure significantly elevated today. I rechecked his blood pressure myself and confirmed.  At that time, my plan was: Add Spriva to 2.5 g 2 inhalations daily. Continue Symbicort. Continue oxygen 2 L via nasal cannula. Add amlodipine 10 mg by mouth daily. Recheck blood pressure in 2 weeks. Check CBC as well as CMP.  08/15/16 His blood pressure is better today although still significantly elevated. He also has more chest congestion than previously on his exam. He has decreased breath sounds bilaterally. He has faint expiratory wheezes but he has rhonchorous breath sounds in the posterior left upper and left lower lobes. He states that he feels that his breathing is getting slightly worse. He is taking spree and Symbicort.  AT that time, my plan was: Patient will get his flu shot today. I will add hydrochlorothiazide 25 mg a day for hypertension. Hopefully this will give [place] closer to our goal of 140/90 or less. I will start a Z-Pak today to try to nip this in the bud both as an antibiotic and is also an immunomodulator.    08/29/16 Patient's cough and breathing is back to his baseline. He continues to have a daily cough productive of clear mucus but he is not having an exacerbation at the  present time. His blood pressure is much better today. He denies any chest pain shortness of breath or dyspnea on exertion. Patient had extensive lab work in August that was all within normal limits. He does have a lesion on his skin just above and anterior to his right ear. It is a brown warty papule similar to a seborrheic keratosis but is also erythematous and irritated from where he has been picking at it. It is difficult to determine exactly what this is. Given its location, this would be a good area for cryotherapy versus excision Past Medical History:  Diagnosis Date  . Arthritis   . Chronic rhinitis   . COPD (chronic obstructive pulmonary disease) (HCC)    PFT 06-26-09 FEV1  1.75( 71%), FVC 3.65( 101%), FEV1% 48, TLC 5.53(104%), DLCO 55%, no BD  . Hypertension   . Nasal septal deviation   . Nocturia   . OSA (obstructive sleep apnea) 04/21/2011  . Skin cancer    Past Surgical History:  Procedure Laterality Date  . APPENDECTOMY    . BACK SURGERY    . CATARACT EXTRACTION W/PHACO  01/06/2013   Procedure: CATARACT EXTRACTION PHACO AND INTRAOCULAR LENS PLACEMENT (IOC);  Surgeon: Tonny Branch, MD;  Location: [facility];  Service: Ophthalmology;  Laterality: Right;  CDE: 22.17    Current Outpatient Prescriptions on File Prior to Visit  Medication Sig Dispense Refill  . albuterol (PROVENTIL HFA;VENTOLIN HFA) 108 (90 BASE)  MCG/ACT inhaler Inhale 1-2 puffs into the lungs every 6 (six) hours as needed for wheezing or shortness of breath. 1 Inhaler 3  . albuterol (PROVENTIL) (2.5 MG/3ML) 0.083% nebulizer solution Take 3 mLs (2.5 mg total) by nebulization every 4 (four) hours as needed for wheezing or shortness of breath. 75 mL 12  . amLODipine (NORVASC) 10 MG tablet Take 1 tablet (10 mg total) by mouth daily. 90 tablet 3  . budesonide-formoterol (SYMBICORT) 160-4.5 MCG/ACT inhaler Inhale 2 puffs into the lungs 2 (two) times daily. 1 Inhaler 6  . cyanocobalamin 2000 MCG tablet Take 2,000 mcg by mouth  daily.    . hydrochlorothiazide (HYDRODIURIL) 25 MG tablet Take 1 tablet (25 mg total) by mouth daily. 90 tablet 3  . losartan (COZAAR) 50 MG tablet Take 1 tablet (50 mg total) by mouth daily. 90 tablet 3  . Multiple Vitamin (MULTIVITAMIN) tablet Take 1 tablet by mouth daily.    Marland Kitchen pyridoxine (B-6) 100 MG tablet Take 100 mg by mouth daily.     No current facility-administered medications on file prior to visit.    Allergies  Allergen Reactions  . Aspirin Other (See Comments)    Upset stomach.  . Other Hypertension    Hazelnuts  . Penicillins Hives    Has patient had a PCN reaction causing immediate rash, facial/tongue/throat swelling, SOB or lightheadedness with hypotension: Yes Has patient had a PCN reaction causing severe rash involving mucus membranes or skin necrosis: No Has patient had a PCN reaction that required hospitalization No Has patient had a PCN reaction occurring within the last 10 years: No If all of the above answers are "NO", then may proceed with Cephalosporin use.   . Sulfa Antibiotics Hives   Social History   Social History  . Marital status: Divorced    Spouse name: N/A  . Number of children: N/A  . Years of education: N/A   Occupational History  . 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 Smoking status: Former Smoker    Packs/day: 1.50    Years: 54.00    Types: Cigarettes    Quit date: 12/01/2001  . Smokeless tobacco: Never Used  . Alcohol use No  . Drug use: No  . Sexual activity: Not on file   Other Topics Concern  . Not on file   Social History Narrative  . No narrative on file   .   Review of Systems  All other systems reviewed and are negative.      Objective:   Physical Exam  Neck: Neck supple. No JVD present.  Cardiovascular: Normal rate, regular rhythm and normal heart sounds.   Pulmonary/Chest: Effort normal. He has decreased breath sounds. He has wheezes. He has no rhonchi. He has no rales.  Abdominal: Soft.  Bowel sounds are normal.  Musculoskeletal: He exhibits no edema.  Vitals reviewed.         Assessment & Plan:  Hypertension, neoplasm uncertain behavior. Blood pressure is now well controlled. I'll make no changes in his medication at this time. I treated the lesion on his right temple with 30 seconds of cryotherapy using liquid nitrogen. Recheck in 3 months

## 2016-11-03 ENCOUNTER — Encounter: Payer: Self-pay | Admitting: Family Medicine

## 2016-11-03 ENCOUNTER — Ambulatory Visit (INDEPENDENT_AMBULATORY_CARE_PROVIDER_SITE_OTHER): Payer: Medicare Other | Admitting: Family Medicine

## 2016-11-03 VITALS — BP 134/86 | HR 80 | Temp 98.1°F | Resp 20 | Wt 165.0 lb

## 2016-11-03 DIAGNOSIS — R600 Localized edema: Secondary | ICD-10-CM | POA: Diagnosis not present

## 2016-11-03 DIAGNOSIS — I10 Essential (primary) hypertension: Secondary | ICD-10-CM

## 2016-11-03 MED ORDER — DOXAZOSIN MESYLATE 4 MG PO TABS
4.0000 mg | ORAL_TABLET | Freq: Every day | ORAL | 5 refills | Status: DC
Start: 2016-11-03 — End: 2017-09-26

## 2016-11-03 NOTE — Progress Notes (Signed)
Subjective:    Patient ID: Michael Evans, male    DOB: 1941/09/18, 75 y.o.   MRN: [id number]  HPI  07/15/16 Patient is a very pleasant 75 year old white male with a history of COPD with chronic oxygen dependence due to hypoxemia and chronic respiratory failure. He is currently on oxygen 2 L via nasal cannula. On room air he is 97% on 2 L. With ambulation he is 97% on 2 L with ambulation off of oxygen, his pulse oximetry drops to 87%. He reports dyspnea on exertion. He reports dyspnea at rest. He has a chronic cough productive of clear mucus. He is currently on Symbicort which he uses every day. He is not taking any acting anti-cholinergic bronchodilator.  He denies any hemoptysis. He denies any chest pain. His blood pressure significantly elevated today. I rechecked his blood pressure myself and confirmed.  At that time, my plan was: Add Spriva to 2.5 g 2 inhalations daily. Continue Symbicort. Continue oxygen 2 L via nasal cannula. Add amlodipine 10 mg by mouth daily. Recheck blood pressure in 2 weeks. Check CBC as well as CMP.  08/15/16 His blood pressure is better today although still significantly elevated. He also has more chest congestion than previously on his exam. He has decreased breath sounds bilaterally. He has faint expiratory wheezes but he has rhonchorous breath sounds in the posterior left upper and left lower lobes. He states that he feels that his breathing is getting slightly worse. He is taking spree and Symbicort.  AT that time, my plan was: Patient will get his flu shot today. I will add hydrochlorothiazide 25 mg a day for hypertension. Hopefully this will give [place] closer to our goal of 140/90 or less. I will start a Z-Pak today to try to nip this in the bud both as an antibiotic and is also an immunomodulator.    08/29/16 Patient's cough and breathing is back to his baseline. He continues to have a daily cough productive of clear mucus but he is not having an exacerbation at the  present time. His blood pressure is much better today. He denies any chest pain shortness of breath or dyspnea on exertion. Patient had extensive lab work in August that was all within normal limits. He does have a lesion on his skin just above and anterior to his right ear. It is a brown warty papule similar to a seborrheic keratosis but is also erythematous and irritated from where he has been picking at it. It is difficult to determine exactly what this is. Given its location, this would be a good area for cryotherapy versus excision.  At that time, my plan was: Hypertension, neoplasm uncertain behavior. Blood pressure is now well controlled. I'll make no changes in his medication at this time. I treated the lesion on his right temple with 30 seconds of cryotherapy using liquid nitrogen. Recheck in 3 months  11/03/16 Patient presents today with swelling in his legs distal to his knee. He has +1 edema from his mid shin to his ankles bilaterally. There is pitting edema in his feet. He denies any chest pain. He denies any shortness of breath. He denies any orthopnea. There are no crackles on pulmonary exam. There is no JVD. There is no evidence of heart failure. Symptoms began after beginning amlodipine. Past Medical History:  Diagnosis Date  . Arthritis   . Chronic rhinitis   . COPD (chronic obstructive pulmonary disease) ([place])    PFT 06-26-09 FEV1  1.75( 71%),  FVC 3.65( 101%), FEV1% 48, TLC 5.53(104%), DLCO 55%, no BD  . Hypertension   . Nasal septal deviation   . Nocturia   . OSA (obstructive sleep apnea) 04/21/2011  . Skin cancer    Past Surgical History:  Procedure Laterality Date  . APPENDECTOMY    . BACK SURGERY    . CATARACT EXTRACTION W/PHACO  01/06/2013   Procedure: CATARACT EXTRACTION PHACO AND INTRAOCULAR LENS PLACEMENT (IOC);  Surgeon: Tonny Branch, MD;  Location: [facility];  Service: Ophthalmology;  Laterality: Right;  CDE: 22.17    Current Outpatient Prescriptions on File Prior to Visit   Medication Sig Dispense Refill  . albuterol (PROVENTIL HFA;VENTOLIN HFA) 108 (90 BASE) MCG/ACT inhaler Inhale 1-2 puffs into the lungs every 6 (six) hours as needed for wheezing or shortness of breath. 1 Inhaler 3  . albuterol (PROVENTIL) (2.5 MG/3ML) 0.083% nebulizer solution Take 3 mLs (2.5 mg total) by nebulization every 4 (four) hours as needed for wheezing or shortness of breath. 75 mL 12  . amLODipine (NORVASC) 10 MG tablet Take 1 tablet (10 mg total) by mouth daily. 90 tablet 3  . budesonide-formoterol (SYMBICORT) 160-4.5 MCG/ACT inhaler Inhale 2 puffs into the lungs 2 (two) times daily. 1 Inhaler 6  . cyanocobalamin 2000 MCG tablet Take 2,000 mcg by mouth daily.    . hydrochlorothiazide (HYDRODIURIL) 25 MG tablet Take 1 tablet (25 mg total) by mouth daily. 90 tablet 3  . Multiple Vitamin (MULTIVITAMIN) tablet Take 1 tablet by mouth daily.    Marland Kitchen pyridoxine (B-6) 100 MG tablet Take 100 mg by mouth daily.    Marland Kitchen losartan (COZAAR) 50 MG tablet Take 1 tablet (50 mg total) by mouth daily. 90 tablet 3   No current facility-administered medications on file prior to visit.    Allergies  Allergen Reactions  . Aspirin Other (See Comments)    Upset stomach.  . Other Hypertension    Hazelnuts  . Penicillins Hives    Has patient had a PCN reaction causing immediate rash, facial/tongue/throat swelling, SOB or lightheadedness with hypotension: Yes Has patient had a PCN reaction causing severe rash involving mucus membranes or skin necrosis: No Has patient had a PCN reaction that required hospitalization No Has patient had a PCN reaction occurring within the last 10 years: No If all of the above answers are "NO", then may proceed with Cephalosporin use.   . Sulfa Antibiotics Hives   Social History   Social History  . Marital status: Divorced    Spouse name: N/A  . Number of children: N/A  . Years of education: N/A   Occupational History  . [employer] Retired   Social History Main Topics   . Smoking status: Former Smoker    Packs/day: 1.50    Years: 54.00    Types: Cigarettes    Quit date: 12/01/2001  . Smokeless tobacco: Never Used  . Alcohol use No  . Drug use: No  . Sexual activity: Not on file   Other Topics Concern  . Not on file   Social History Narrative  . No narrative on file   .   Review of Systems  All other systems reviewed and are negative.      Objective:   Physical Exam  Neck: Neck supple. No JVD present.  Cardiovascular: Normal rate, regular rhythm and normal heart sounds.   Pulmonary/Chest: Effort normal. He has decreased breath sounds. He has wheezes. He has no rhonchi. He has no rales.  Abdominal: Soft. Bowel  sounds are normal.  Musculoskeletal: He exhibits no edema.  Vitals reviewed. + edema        Assessment & Plan:  Benign essential HTN - Plan: doxazosin (CARDURA) 4 MG tablet  Localized edema - Plan: Brain natriuretic peptide  Discontinue amlodipine. I believe this is a medication side effect. I do not believe that this is evidence of congestive heart failure. I will check a BNP. Replace amlodipine with Cardura 4 mg by mouth daily recheck in one week. Echocardiogram if symptoms persist

## 2016-11-04 LAB — BRAIN NATRIURETIC PEPTIDE: BRAIN NATRIURETIC PEPTIDE: 32.5 pg/mL (ref <100)

## 2016-11-06 ENCOUNTER — Ambulatory Visit: Payer: Medicare Other | Admitting: Family Medicine

## 2016-11-14 ENCOUNTER — Ambulatory Visit (INDEPENDENT_AMBULATORY_CARE_PROVIDER_SITE_OTHER): Payer: Medicare Other | Admitting: Family Medicine

## 2016-11-14 DIAGNOSIS — J441 Chronic obstructive pulmonary disease with (acute) exacerbation: Secondary | ICD-10-CM

## 2016-11-14 MED ORDER — LEVOFLOXACIN 500 MG PO TABS: 500.0000 mg | ORAL_TABLET | Freq: Every day | ORAL | 0 refills | Status: DC

## 2016-11-14 MED ORDER — ALBUTEROL SULFATE (2.5 MG/3ML) 0.083% IN NEBU: 2.5000 mg | INHALATION_SOLUTION | RESPIRATORY_TRACT | 12 refills | Status: DC | PRN

## 2016-11-14 MED ORDER — PREDNISONE 20 MG PO TABS: ORAL_TABLET | ORAL | 0 refills | Status: DC

## 2016-11-14 NOTE — Progress Notes (Signed)
Subjective:    Patient ID: Michael Evans, male    DOB: 22-Jun-1941, 75 y.o.   MRN: [id number]  HPI Patient sent started earlier this week. Over the last 2 days he developed diffuse myalgias. He reports worsening cough productive of yellow and green sputum. He also reports right posterior lower lobe pleurisy. On examination he is wheezing bilaterally with diminished breath sounds however there are pronounced right posterior lobe Rales in the exact area that he feels pleurisy. He also reports subjective fevers. I believe the patient is having a COPD exacerbation and possibly community-acquired pneumonia. He is worked in urgently due to his symptoms. Past Medical History:  Diagnosis Date  . Arthritis   . Chronic rhinitis   . COPD (chronic obstructive pulmonary disease) (HCC)    PFT 06-26-09 FEV1  1.75( 71%), FVC 3.65( 101%), FEV1% 48, TLC 5.53(104%), DLCO 55%, no BD  . Hypertension   . Nasal septal deviation   . Nocturia   . OSA (obstructive sleep apnea) 04/21/2011  . Skin cancer    Past Surgical History:  Procedure Laterality Date  . APPENDECTOMY    . BACK SURGERY    . CATARACT EXTRACTION W/PHACO  01/06/2013   Procedure: CATARACT EXTRACTION PHACO AND INTRAOCULAR LENS PLACEMENT (IOC);  Surgeon: Tonny Branch, MD;  Location: [facility];  Service: Ophthalmology;  Laterality: Right;  CDE: 22.17   Current Outpatient Prescriptions on File Prior to Visit  Medication Sig Dispense Refill  . albuterol (PROVENTIL HFA;VENTOLIN HFA) 108 (90 BASE) MCG/ACT inhaler Inhale 1-2 puffs into the lungs every 6 (six) hours as needed for wheezing or shortness of breath. 1 Inhaler 3  . amLODipine (NORVASC) 10 MG tablet Take 1 tablet (10 mg total) by mouth daily. 90 tablet 3  . budesonide-formoterol (SYMBICORT) 160-4.5 MCG/ACT inhaler Inhale 2 puffs into the lungs 2 (two) times daily. 1 Inhaler 6  . cyanocobalamin 2000 MCG tablet Take 2,000 mcg by mouth daily.    Marland Kitchen doxazosin (CARDURA) 4 MG tablet Take 1 tablet (4 mg total)  by mouth daily. 30 tablet 5  . hydrochlorothiazide (HYDRODIURIL) 25 MG tablet Take 1 tablet (25 mg total) by mouth daily. 90 tablet 3  . losartan (COZAAR) 50 MG tablet Take 1 tablet (50 mg total) by mouth daily. 90 tablet 3  . Multiple Vitamin (MULTIVITAMIN) tablet Take 1 tablet by mouth daily.    Marland Kitchen pyridoxine (B-6) 100 MG tablet Take 100 mg by mouth daily.     No current facility-administered medications on file prior to visit.    Allergies  Allergen Reactions  . Aspirin Other (See Comments)    Upset stomach.  . Other Hypertension    Hazelnuts  . Penicillins Hives    Has patient had a PCN reaction causing immediate rash, facial/tongue/throat swelling, SOB or lightheadedness with hypotension: Yes Has patient had a PCN reaction causing severe rash involving mucus membranes or skin necrosis: No Has patient had a PCN reaction that required hospitalization No Has patient had a PCN reaction occurring within the last 10 years: No If all of the above answers are "NO", then may proceed with Cephalosporin use.   . Sulfa Antibiotics Hives   Social History   Social History  . Marital status: Divorced    Spouse name: N/A  . Number of children: N/A  . Years of education: N/A   Occupational History  . 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 Smoking status: Former Smoker    Packs/day: 1.50  Years: 54.00    Types: Cigarettes    Quit date: 12/01/2001  . Smokeless tobacco: Never Used  . Alcohol use No  . Drug use: No  . Sexual activity: Not on file   Other Topics Concern  . Not on file   Social History Narrative  . No narrative on file      Review of Systems  All other systems reviewed and are negative.      Objective:   Physical Exam  Cardiovascular: Normal rate, regular rhythm and normal heart sounds.   Pulmonary/Chest: He has wheezes. He has rales.  Abdominal: Soft. Bowel sounds are normal. He exhibits no distension. There is no tenderness. There is no  rebound and no guarding.  Musculoskeletal: He exhibits no edema.  Vitals reviewed.         Assessment & Plan:  COPD exacerbation ([place]) - Plan: albuterol (PROVENTIL) (2.5 MG/3ML) 0.083% nebulizer solution, predniSONE (DELTASONE) 20 MG tablet, levofloxacin (LEVAQUIN) 500 MG tablet  Possible early community-acquired pneumonia as well. Begin prednisone taper pack and albuterol 2.5 mg nebs every 6 hours as needed for wheezing. Start Levaquin 500 mg by mouth daily for 7 days. Recheck on Monday or immediately if worse

## 2016-11-17 ENCOUNTER — Other Ambulatory Visit: Payer: Self-pay | Admitting: Family Medicine

## 2016-11-17 ENCOUNTER — Encounter: Payer: Self-pay | Admitting: Family Medicine

## 2016-11-17 ENCOUNTER — Ambulatory Visit (INDEPENDENT_AMBULATORY_CARE_PROVIDER_SITE_OTHER): Payer: Medicare Other | Admitting: Family Medicine

## 2016-11-17 VITALS — BP 136/88 | HR 70 | Temp 98.4°F | Resp 18 | Ht 65.0 in | Wt 165.0 lb

## 2016-11-17 DIAGNOSIS — J441 Chronic obstructive pulmonary disease with (acute) exacerbation: Secondary | ICD-10-CM | POA: Diagnosis not present

## 2016-11-17 NOTE — Progress Notes (Signed)
Subjective:    Patient ID: Michael Evans, male    DOB: 05-Jun-1941, 75 y.o.   MRN: [id number]  HPI  12/15 Patient sent started earlier this week. Over the last 2 days he developed diffuse myalgias. He reports worsening cough productive of yellow and green sputum. He also reports right posterior lower lobe pleurisy. On examination he is wheezing bilaterally with diminished breath sounds however there are pronounced right posterior lobe Rales in the exact area that he feels pleurisy. He also reports subjective fevers. I believe the patient is having a COPD exacerbation and possibly community-acquired pneumonia. He is worked in urgently due to his symptoms.  At that time, my plan was: Possible early community-acquired pneumonia as well. Begin prednisone taper pack and albuterol 2.5 mg nebs every 6 hours as needed for wheezing. Start Levaquin 500 mg by mouth daily for 7 days. Recheck on Monday or immediately if worse  12/18 Patient states that he is doing some better. He is a proximally 30% better. He is still coughing up green sputum. He still has right basilar crackles. However his air movement has improved dramatically. The wheezing has improved dramatically. His shortness of breath is improving. He denies any fevers or chills. He continues to have right sided pleurisy or the crackles are located Past Medical History:  Diagnosis Date  . Arthritis   . Chronic rhinitis   . COPD (chronic obstructive pulmonary disease) (HCC)    PFT 06-26-09 FEV1  1.75( 71%), FVC 3.65( 101%), FEV1% 48, TLC 5.53(104%), DLCO 55%, no BD  . Hypertension   . Nasal septal deviation   . Nocturia   . OSA (obstructive sleep apnea) 04/21/2011  . Skin cancer    Past Surgical History:  Procedure Laterality Date  . APPENDECTOMY    . BACK SURGERY    . CATARACT EXTRACTION W/PHACO  01/06/2013   Procedure: CATARACT EXTRACTION PHACO AND INTRAOCULAR LENS PLACEMENT (IOC);  Surgeon: Tonny Branch, MD;  Location: [facility];  Service:  Ophthalmology;  Laterality: Right;  CDE: 22.17   Current Outpatient Prescriptions on File Prior to Visit  Medication Sig Dispense Refill  . albuterol (PROVENTIL HFA;VENTOLIN HFA) 108 (90 BASE) MCG/ACT inhaler Inhale 1-2 puffs into the lungs every 6 (six) hours as needed for wheezing or shortness of breath. 1 Inhaler 3  . albuterol (PROVENTIL) (2.5 MG/3ML) 0.083% nebulizer solution Take 3 mLs (2.5 mg total) by nebulization every 4 (four) hours as needed for wheezing or shortness of breath. 75 mL 12  . amLODipine (NORVASC) 10 MG tablet Take 1 tablet (10 mg total) by mouth daily. 90 tablet 3  . budesonide-formoterol (SYMBICORT) 160-4.5 MCG/ACT inhaler Inhale 2 puffs into the lungs 2 (two) times daily. 1 Inhaler 6  . cyanocobalamin 2000 MCG tablet Take 2,000 mcg by mouth daily.    Marland Kitchen doxazosin (CARDURA) 4 MG tablet Take 1 tablet (4 mg total) by mouth daily. 30 tablet 5  . hydrochlorothiazide (HYDRODIURIL) 25 MG tablet Take 1 tablet (25 mg total) by mouth daily. 90 tablet 3  . levofloxacin (LEVAQUIN) 500 MG tablet Take 1 tablet (500 mg total) by mouth daily. 7 tablet 0  . losartan (COZAAR) 50 MG tablet Take 1 tablet (50 mg total) by mouth daily. 90 tablet 3  . Multiple Vitamin (MULTIVITAMIN) tablet Take 1 tablet by mouth daily.    . OXYGEN Inhale 2 L into the lungs continuous.    Marland Kitchen pyridoxine (B-6) 100 MG tablet Take 100 mg by mouth daily.  No current facility-administered medications on file prior to visit.    Allergies  Allergen Reactions  . Aspirin Other (See Comments)    Upset stomach.  . Other Hypertension    Hazelnuts  . Penicillins Hives    Has patient had a PCN reaction causing immediate rash, facial/tongue/throat swelling, SOB or lightheadedness with hypotension: Yes Has patient had a PCN reaction causing severe rash involving mucus membranes or skin necrosis: No Has patient had a PCN reaction that required hospitalization No Has patient had a PCN reaction occurring within the last  10 years: No If all of the above answers are "NO", then may proceed with Cephalosporin use.   . Sulfa Antibiotics Hives   Social History   Social History  . Marital status: Divorced    Spouse name: N/A  . Number of children: N/A  . Years of education: N/A   Occupational History  . 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 Smoking status: Former Smoker    Packs/day: 1.50    Years: 54.00    Types: Cigarettes    Quit date: 12/01/2001  . Smokeless tobacco: Never Used  . Alcohol use No  . Drug use: No  . Sexual activity: Not on file   Other Topics Concern  . Not on file   Social History Narrative  . No narrative on file      Review of Systems  All other systems reviewed and are negative.      Objective:   Physical Exam  Cardiovascular: Normal rate, regular rhythm and normal heart sounds.   Pulmonary/Chest: He has wheezes. He has rales.  Abdominal: Soft. Bowel sounds are normal. He exhibits no distension. There is no tenderness. There is no rebound and no guarding.  Musculoskeletal: He exhibits no edema.  Vitals reviewed.         Assessment & Plan:  COPD exacerbation, possible community-acquired pneumonia. Clinically improving. Recheck on Friday or sooner if worse. May need to extend antibiotics longer if symptoms are persistent

## 2016-11-17 NOTE — Telephone Encounter (Signed)
Difficulty getting his nebulizer meds from pharmacy.  Pharmacy needs his [facility] card so it can be billed to his Part B plan.  Pt made aware.

## 2016-11-18 ENCOUNTER — Other Ambulatory Visit: Payer: Self-pay | Admitting: Family Medicine

## 2016-11-18 DIAGNOSIS — J439 Emphysema, unspecified: Secondary | ICD-10-CM

## 2016-11-18 DIAGNOSIS — J441 Chronic obstructive pulmonary disease with (acute) exacerbation: Secondary | ICD-10-CM

## 2016-11-18 MED ORDER — ALBUTEROL SULFATE (2.5 MG/3ML) 0.083% IN NEBU: 2.5000 mg | INHALATION_SOLUTION | RESPIRATORY_TRACT | 11 refills | Status: DC

## 2016-11-18 MED ORDER — ALBUTEROL SULFATE (2.5 MG/3ML) 0.083% IN NEBU: 2.5000 mg | INHALATION_SOLUTION | RESPIRATORY_TRACT | 12 refills | Status: DC | PRN

## 2016-11-18 NOTE — Telephone Encounter (Signed)
Rx given for neb solution to bill under MCR B

## 2016-11-19 ENCOUNTER — Other Ambulatory Visit: Payer: Self-pay | Admitting: Family Medicine

## 2016-11-19 ENCOUNTER — Telehealth: Payer: Self-pay | Admitting: Family Medicine

## 2016-11-19 DIAGNOSIS — J439 Emphysema, unspecified: Secondary | ICD-10-CM

## 2016-11-19 MED ORDER — ALBUTEROL SULFATE (2.5 MG/3ML) 0.083% IN NEBU: 2.5000 mg | INHALATION_SOLUTION | RESPIRATORY_TRACT | 11 refills | Status: DC

## 2016-11-19 NOTE — Telephone Encounter (Signed)
I had patient all set up with [facility] to get his Nebulizer medications and bill his Part B (so no expense to him).  Today he calls and said he refused them to deliver when they called him. "A friend of his said they could not be trusted"  Now wants new prescription to carry to his local pharmacy.  I told him this would probably required prior Auth thru his Medicare Part D and may be declined as it is covered under Part B and this may incur an expense on his part.  Pt said if he has to pay he will.  New prescription printed for patient to carry to his pharmacy per his request.

## 2016-11-20 NOTE — Telephone Encounter (Signed)
OK 

## 2016-12-03 ENCOUNTER — Ambulatory Visit (INDEPENDENT_AMBULATORY_CARE_PROVIDER_SITE_OTHER): Payer: Medicare Other | Admitting: Physician Assistant

## 2016-12-03 ENCOUNTER — Ambulatory Visit (HOSPITAL_COMMUNITY)
Admission: RE | Admit: 2016-12-03 | Discharge: 2016-12-03 | Disposition: A | Discharge status: Home / Self Care | Payer: Medicare Other | Source: Ambulatory Visit | Attending: Physician Assistant | Admitting: Physician Assistant

## 2016-12-03 ENCOUNTER — Encounter: Payer: Self-pay | Admitting: Physician Assistant

## 2016-12-03 VITALS — BP 132/70 | HR 108 | Temp 98.5°F | Resp 22 | Ht 65.0 in | Wt 161.0 lb

## 2016-12-03 DIAGNOSIS — I700 Atherosclerosis of aorta: Secondary | ICD-10-CM | POA: Diagnosis not present

## 2016-12-03 DIAGNOSIS — J988 Other specified respiratory disorders: Secondary | ICD-10-CM | POA: Diagnosis not present

## 2016-12-03 DIAGNOSIS — R05 Cough: Secondary | ICD-10-CM | POA: Diagnosis not present

## 2016-12-03 DIAGNOSIS — J439 Emphysema, unspecified: Secondary | ICD-10-CM | POA: Diagnosis not present

## 2016-12-03 DIAGNOSIS — B9689 Other specified bacterial agents as the cause of diseases classified elsewhere: Secondary | ICD-10-CM

## 2016-12-04 MED ORDER — LEVOFLOXACIN 500 MG PO TABS: 500.0000 mg | ORAL_TABLET | Freq: Every day | ORAL | 0 refills | Status: DC

## 2016-12-04 MED ORDER — PREDNISONE 20 MG PO TABS: ORAL_TABLET | ORAL | 0 refills | Status: DC

## 2016-12-04 NOTE — Progress Notes (Signed)
Patient ID: Michael Evans MRN: [id number], DOB: [birth year], 76 y.o. Date of Encounter: 12/04/2016, 7:29 AM    Chief Complaint:  Chief Complaint  Patient presents with  . Illness    x1 month- productive cough with green mucus, wheezing, SOB     HPI: 76 y.o. year old male presents with above.  I reviewed his office notes from 11/14/16 and 11/17/16. On 11/14/16 he reported worsening cough productive of yellow-green sputum. Reported right posterior lower lobe pleurisy. Reported fever. At that time he was treated with prednisone taper pack albuterol nebulizers and Levaquin 500 mg daily for 7 days. Planned for recheck on Monday. Presented for follow-up on 11/17/16. Reported that he was doing some better-- approximately 30% better. Still coughing up green sputum. On exam the wheezes had improved. Was felt that he was improving. Was felt that they may need to extend the antibiotics longer if the symptoms were persistent. Was supposed to follow-up that Friday but did not have any further follow-up until today.  Today he reports that those symptoms never got much better. He has continued with this cough ever since. Shows me a tissue where he has just coughed up phlegm while sitting in the office exam room. Tissue was full of thick phlegm large amount. Says that he has not had any mucus from his nose and has not been having congestion in his head and nose-- just his chest. States he has been using the Symbicort 2 puffs twice a day and has been using the albuterol nebulizer twice a day. He is no longer having the pleuritic chest pain in the right lower lobe. Even on exam when I have him take deep breath he reports that there is no pain there now.     Home Meds:   Outpatient Medications Prior to Visit  Medication Sig Dispense Refill  . albuterol (PROVENTIL HFA;VENTOLIN HFA) 108 (90 BASE) MCG/ACT inhaler Inhale 1-2 puffs into the lungs every 6 (six) hours as needed for wheezing or shortness of breath.  1 Inhaler 3  . albuterol (PROVENTIL) (2.5 MG/3ML) 0.083% nebulizer solution Take 3 mLs (2.5 mg total) by nebulization every 4 (four) hours. And as needed for shortness of breath 150 mL 11  . amLODipine (NORVASC) 10 MG tablet Take 1 tablet (10 mg total) by mouth daily. 90 tablet 3  . budesonide-formoterol (SYMBICORT) 160-4.5 MCG/ACT inhaler Inhale 2 puffs into the lungs 2 (two) times daily. 1 Inhaler 6  . cyanocobalamin 2000 MCG tablet Take 2,000 mcg by mouth daily.    Marland Kitchen doxazosin (CARDURA) 4 MG tablet Take 1 tablet (4 mg total) by mouth daily. 30 tablet 5  . hydrochlorothiazide (HYDRODIURIL) 25 MG tablet Take 1 tablet (25 mg total) by mouth daily. 90 tablet 3  . losartan (COZAAR) 50 MG tablet Take 1 tablet (50 mg total) by mouth daily. 90 tablet 3  . Multiple Vitamin (MULTIVITAMIN) tablet Take 1 tablet by mouth daily.    . OXYGEN Inhale 2 L into the lungs continuous.    . predniSONE (DELTASONE) 10 MG tablet     . pyridoxine (B-6) 100 MG tablet Take 100 mg by mouth daily.    Marland Kitchen levofloxacin (LEVAQUIN) 500 MG tablet Take 1 tablet (500 mg total) by mouth daily. (Patient not taking: Reported on 12/03/2016) 7 tablet 0   No facility-administered medications prior to visit.     Allergies:  Allergies  Allergen Reactions  . Aspirin Other (See Comments)    Upset stomach.  . Other  Hypertension    Hazelnuts  . Penicillins Hives    Has patient had a PCN reaction causing immediate rash, facial/tongue/throat swelling, SOB or lightheadedness with hypotension: Yes Has patient had a PCN reaction causing severe rash involving mucus membranes or skin necrosis: No Has patient had a PCN reaction that required hospitalization No Has patient had a PCN reaction occurring within the last 10 years: No If all of the above answers are "NO", then may proceed with Cephalosporin use.   . Sulfa Antibiotics Hives      Review of Systems: See HPI for pertinent ROS. All other ROS negative.    Physical Exam: Blood  pressure 132/70, pulse (!) 108, temperature 98.5 F (36.9 C), temperature source Oral, resp. rate (!) 22, height 5\' 5"  (1.651 m), weight 161 lb (73 kg), SpO2 93 %., Body mass index is 26.79 kg/m. General: WM wearing [place] Oxygen.  Appears in no acute distress. HEENT: Normocephalic, atraumatic, eyes without discharge, sclera non-icteric, nares are without discharge. Bilateral auditory canals clear, TM's are without perforation, pearly grey and translucent with reflective cone of light bilaterally. Oral cavity moist, posterior pharynx without exudate, erythema, peritonsillar abscess, or post nasal drip.  Neck: Supple. No thyromegaly. No lymphadenopathy. Lungs: Distant decreased breath sounds throughout bilaterally. No wheezes. Heart: Regular rhythm. No murmurs, rubs, or gallops. Msk:  Strength and tone normal for age. Extremities/Skin: Warm and dry. Neuro: Alert and oriented X 3. Moves all extremities spontaneously. Gait is normal. CNII-XII grossly in tact. Psych:  Responds to questions appropriately with a normal affect.     ASSESSMENT AND PLAN:  76 y.o. year old male with   1. Pulmonary emphysema, unspecified emphysema type ([place]) - DG Chest 2 View; Future Chest x-ray obtained. Shows no acute pulmonary disease. No significant change from prior chest x-ray. Does not show pneumonia. Will treat with Levaquin but a longer duration--- Levaquin 500 mg daily for 10 days Will also add prednisone 20 mg take 2 daily for 3 days and then 1 daily for 3 days Cont  the Symbicort the albuterol nebulizer and the nasal cannula oxygen. If symptoms not back to baseline at 10 days he needs to return for office visit at that point.  2. Bacterial respiratory infection - DG Chest 2 View; Future   Signed, [place], [place], [facility] 12/04/2016 7:29 AM

## 2017-01-05 ENCOUNTER — Encounter: Payer: Self-pay | Admitting: Physician Assistant

## 2017-01-05 ENCOUNTER — Ambulatory Visit (INDEPENDENT_AMBULATORY_CARE_PROVIDER_SITE_OTHER): Payer: Medicare Other | Admitting: Physician Assistant

## 2017-01-05 VITALS — BP 140/82 | HR 86 | Temp 97.9°F | Resp 16 | Wt 168.4 lb

## 2017-01-05 DIAGNOSIS — J441 Chronic obstructive pulmonary disease with (acute) exacerbation: Secondary | ICD-10-CM

## 2017-01-05 MED ORDER — DOXYCYCLINE HYCLATE 100 MG PO TABS
100.0000 mg | ORAL_TABLET | Freq: Two times a day (BID) | ORAL | 0 refills | Status: DC
Start: 2017-01-05 — End: 2017-01-21

## 2017-01-05 MED ORDER — PREDNISONE 20 MG PO TABS: 20.0000 mg | ORAL_TABLET | Freq: Every day | ORAL | 0 refills | Status: DC

## 2017-01-05 NOTE — Progress Notes (Signed)
Patient ID: Michael Evans MRN: [id number], DOB: 01-Jan-1941, 76 y.o. Date of Encounter: 01/05/2017, 9:36 AM    Chief Complaint:  Chief Complaint  Patient presents with  . Illness    x1 month- productive cough with green mucus, wheezing, SOB     HPI: 76 y.o. year old male presents with above.  COPIED FROM MY OV NOTE 12/03/2016: I reviewed his office notes from 11/14/16 and 11/17/16. On 11/14/16 he reported worsening cough productive of yellow-green sputum. Reported right posterior lower lobe pleurisy. Reported fever. At that time he was treated with prednisone taper pack albuterol nebulizers and Levaquin 500 mg daily for 7 days. Planned for recheck on Monday. Presented for follow-up on 11/17/16. Reported that he was doing some better-- approximately 30% better. Still coughing up green sputum. On exam the wheezes had improved. Was felt that he was improving. Was felt that they may need to extend the antibiotics longer if the symptoms were persistent. Was supposed to follow-up that Friday but did not have any further follow-up until today.  Today he reports that those symptoms never got much better. He has continued with this cough ever since. Shows me a tissue where he has just coughed up phlegm while sitting in the office exam room. Tissue was full of thick phlegm large amount. Says that he has not had any mucus from his nose and has not been having congestion in his head and nose-- just his chest. States he has been using the Symbicort 2 puffs twice a day and has been using the albuterol nebulizer twice a day. He is no longer having the pleuritic chest pain in the right lower lobe. Even on exam when I have him take deep breath he reports that there is no pain there now.   1. Pulmonary emphysema, unspecified emphysema type ([place]) - DG Chest 2 View; Future Chest x-ray obtained. Shows no acute pulmonary disease. No significant change from prior chest x-ray. Does not show pneumonia. Will treat  with Levaquin but a longer duration--- Levaquin 500 mg daily for 10 days Will also add prednisone 20 mg take 2 daily for 3 days and then 1 daily for 3 days Cont  the Symbicort the albuterol nebulizer and the nasal cannula oxygen. If symptoms not back to baseline at 10 days he needs to return for office visit at that point.  2. Bacterial respiratory infection - DG Chest 2 View; Future     01/05/2017: Today he reports that after that last office visit after he took those medications his symptoms did return back to his normal baseline. Reports that these symptoms started sometime last week. Congestion in his lungs again. He is coughing up thick green phlegm again. During the visit coughs up phlegm on a tissue and I saw about 1 tablespoon of very thick dark phlegm. He has had no fevers no chills. No congestion and his head or nose. Says he has wheezing and has increased cough at night when he is lying down.  Home Meds:   Outpatient Medications Prior to Visit  Medication Sig Dispense Refill  . albuterol (PROVENTIL HFA;VENTOLIN HFA) 108 (90 BASE) MCG/ACT inhaler Inhale 1-2 puffs into the lungs every 6 (six) hours as needed for wheezing or shortness of breath. 1 Inhaler 3  . albuterol (PROVENTIL) (2.5 MG/3ML) 0.083% nebulizer solution Take 3 mLs (2.5 mg total) by nebulization every 4 (four) hours. And as needed for shortness of breath 150 mL 11  . budesonide-formoterol (SYMBICORT) 160-4.5 MCG/ACT inhaler  Inhale 2 puffs into the lungs 2 (two) times daily. 1 Inhaler 6  . cyanocobalamin 2000 MCG tablet Take 2,000 mcg by mouth daily.    Marland Kitchen doxazosin (CARDURA) 4 MG tablet Take 1 tablet (4 mg total) by mouth daily. 30 tablet 5  . hydrochlorothiazide (HYDRODIURIL) 25 MG tablet Take 1 tablet (25 mg total) by mouth daily. 90 tablet 3  . Multiple Vitamin (MULTIVITAMIN) tablet Take 1 tablet by mouth daily.    . OXYGEN Inhale 2 L into the lungs continuous.    Marland Kitchen amLODipine (NORVASC) 10 MG tablet Take 1 tablet  (10 mg total) by mouth daily. (Patient not taking: Reported on 01/05/2017) 90 tablet 3  . levofloxacin (LEVAQUIN) 500 MG tablet Take 1 tablet (500 mg total) by mouth daily. (Patient not taking: Reported on 01/05/2017) 10 tablet 0  . losartan (COZAAR) 50 MG tablet Take 1 tablet (50 mg total) by mouth daily. (Patient not taking: Reported on 01/05/2017) 90 tablet 3  . predniSONE (DELTASONE) 10 MG tablet     . predniSONE (DELTASONE) 20 MG tablet Take 2 daily for 3 days then take 1 daily for 3 days (Patient not taking: Reported on 01/05/2017) 9 tablet 0  . pyridoxine (B-6) 100 MG tablet Take 100 mg by mouth daily.     No facility-administered medications prior to visit.     Allergies:  Allergies  Allergen Reactions  . Aspirin Other (See Comments)    Upset stomach.  . Other Hypertension    Hazelnuts  . Penicillins Hives    Has patient had a PCN reaction causing immediate rash, facial/tongue/throat swelling, SOB or lightheadedness with hypotension: Yes Has patient had a PCN reaction causing severe rash involving mucus membranes or skin necrosis: No Has patient had a PCN reaction that required hospitalization No Has patient had a PCN reaction occurring within the last 10 years: No If all of the above answers are "NO", then may proceed with Cephalosporin use.   . Sulfa Antibiotics Hives      Review of Systems: See HPI for pertinent ROS. All other ROS negative.    Physical Exam: Blood pressure 140/82, pulse 86, temperature 97.9 F (36.6 C), temperature source Oral, resp. rate 16, weight 168 lb 6.4 oz (76.4 kg), SpO2 97 %., Body mass index is 28.02 kg/m. General: WM wearing [place] Oxygen.  Appears in no acute distress. HEENT: Normocephalic, atraumatic, eyes without discharge, sclera non-icteric, nares are without discharge. Bilateral auditory canals clear, TM's are without perforation, pearly grey and translucent with reflective cone of light bilaterally. Oral cavity moist, posterior pharynx without  exudate, erythema, peritonsillar abscess, or post nasal drip.  Neck: Supple. No thyromegaly. No lymphadenopathy. Lungs: Distant decreased breath sounds throughout bilaterally. No wheezes. Heart: Regular rhythm. No murmurs, rubs, or gallops. Msk:  Strength and tone normal for age. Extremities/Skin: Warm and dry. Neuro: Alert and oriented X 3. Moves all extremities spontaneously. Gait is normal. CNII-XII grossly in tact. Psych:  Responds to questions appropriately with a normal affect.     ASSESSMENT AND PLAN:  76 y.o. year old male with   1. COPD exacerbation (HCC) Continue all current medications including all current pulmonary meds. He is wearing his nasal cannula oxygen. He is to add doxycycline and take as directed and also the prednisone as directed. Follow up with [place] if symptoms worsen or develops fever or if symptoms do not resolve after completion of antibiotic. - doxycycline (VIBRA-TABS) 100 MG tablet; Take 1 tablet (100 mg total) by mouth 2 (  two) times daily.  Dispense: 20 tablet; Refill: 0 - predniSONE (DELTASONE) 20 MG tablet; Take 1 tablet (20 mg total) by mouth daily with breakfast.  Dispense: 5 tablet; Refill: 0    Signed, [address] [place], [place], [facility] 01/05/2017 9:36 AM

## 2017-01-21 ENCOUNTER — Ambulatory Visit (INDEPENDENT_AMBULATORY_CARE_PROVIDER_SITE_OTHER): Payer: Medicare Other | Admitting: Physician Assistant

## 2017-01-21 ENCOUNTER — Encounter: Payer: Self-pay | Admitting: Physician Assistant

## 2017-01-21 VITALS — BP 126/70 | HR 71 | Temp 97.6°F | Resp 16 | Wt 166.2 lb

## 2017-01-21 DIAGNOSIS — J441 Chronic obstructive pulmonary disease with (acute) exacerbation: Secondary | ICD-10-CM | POA: Diagnosis not present

## 2017-01-21 MED ORDER — DOXYCYCLINE HYCLATE 100 MG PO TABS: 100.0000 mg | ORAL_TABLET | Freq: Two times a day (BID) | ORAL | 0 refills | Status: DC

## 2017-01-21 NOTE — Progress Notes (Signed)
Patient ID: ANASTASIA DEHOOG MRN: [id number], DOB: 1941-07-04, 76 y.o. Date of Encounter: 01/21/2017, 10:37 AM    Chief Complaint:  Chief Complaint  Patient presents with  . Illness     productive cough with green phlegm     HPI: 76 y.o. year old male presents with above.  COPIED FROM MY OV NOTE 12/03/2016: I reviewed his office notes from 11/14/16 and 11/17/16. On 11/14/16 he reported worsening cough productive of yellow-green sputum. Reported right posterior lower lobe pleurisy. Reported fever. At that time he was treated with prednisone taper pack albuterol nebulizers and Levaquin 500 mg daily for 7 days. Planned for recheck on Monday. Presented for follow-up on 11/17/16. Reported that he was doing some better-- approximately 30% better. Still coughing up green sputum. On exam the wheezes had improved. Was felt that he was improving. Was felt that they may need to extend the antibiotics longer if the symptoms were persistent. Was supposed to follow-up that Friday but did not have any further follow-up until today.  Today he reports that those symptoms never got much better. He has continued with this cough ever since. Shows me a tissue where he has just coughed up phlegm while sitting in the office exam room. Tissue was full of thick phlegm large amount. Says that he has not had any mucus from his nose and has not been having congestion in his head and nose-- just his chest. States he has been using the Symbicort 2 puffs twice a day and has been using the albuterol nebulizer twice a day. He is no longer having the pleuritic chest pain in the right lower lobe. Even on exam when I have him take deep breath he reports that there is no pain there now.   1. Pulmonary emphysema, unspecified emphysema type ([place]) - DG Chest 2 View; Future Chest x-ray obtained. Shows no acute pulmonary disease. No significant change from prior chest x-ray. Does not show pneumonia. Will treat with Levaquin but a  longer duration--- Levaquin 500 mg daily for 10 days Will also add prednisone 20 mg take 2 daily for 3 days and then 1 daily for 3 days Cont  the Symbicort the albuterol nebulizer and the nasal cannula oxygen. If symptoms not back to baseline at 10 days he needs to return for office visit at that point.  2. Bacterial respiratory infection - DG Chest 2 View; Future     01/05/2017: Today he reports that after that last office visit after he took those medications his symptoms did return back to his normal baseline. Reports that these symptoms started sometime last week. Congestion in his lungs again. He is coughing up thick green phlegm again. During the visit coughs up phlegm on a tissue and I saw about 1 tablespoon of very thick dark phlegm. He has had no fevers no chills. No congestion and his head or nose. Says he has wheezing and has increased cough at night when he is lying down. 1. COPD exacerbation ([place]) Continue all current medications including all current pulmonary meds. He is wearing his nasal cannula oxygen. He is to add doxycycline and take as directed and also the prednisone as directed. Follow up with [place] if symptoms worsen or develops fever or if symptoms do not resolve after completion of antibiotic. - doxycycline (VIBRA-TABS) 100 MG tablet; Take 1 tablet (100 mg total) by mouth 2 (two) times daily.  Dispense: 20 tablet; Refill: 0 - predniSONE (DELTASONE) 20 MG tablet; Take 1 tablet (  20 mg total) by mouth daily with breakfast.  Dispense: 5 tablet; Refill: 0   01/21/2017: He reports that the phlegm and cough had resolved. Had decreased and had stayed cleared until about a week after he had finished the antibiotics. About a week after he finished the antibiotics-- he started coughing up green phlegm again. Has a hard time coughing it out. Otherwise is not noticing a lot of increased shortness of breath/wheezing. No congestion in his head or nose. No fevers.   Home Meds:     Outpatient Medications Prior to Visit  Medication Sig Dispense Refill  . albuterol (PROVENTIL HFA;VENTOLIN HFA) 108 (90 BASE) MCG/ACT inhaler Inhale 1-2 puffs into the lungs every 6 (six) hours as needed for wheezing or shortness of breath. 1 Inhaler 3  . albuterol (PROVENTIL) (2.5 MG/3ML) 0.083% nebulizer solution Take 3 mLs (2.5 mg total) by nebulization every 4 (four) hours. And as needed for shortness of breath 150 mL 11  . amLODipine (NORVASC) 10 MG tablet Take 1 tablet (10 mg total) by mouth daily. 90 tablet 3  . budesonide-formoterol (SYMBICORT) 160-4.5 MCG/ACT inhaler Inhale 2 puffs into the lungs 2 (two) times daily. 1 Inhaler 6  . cyanocobalamin 2000 MCG tablet Take 2,000 mcg by mouth daily.    . hydrochlorothiazide (HYDRODIURIL) 25 MG tablet Take 1 tablet (25 mg total) by mouth daily. 90 tablet 3  . losartan (COZAAR) 50 MG tablet Take 1 tablet (50 mg total) by mouth daily. 90 tablet 3  . Multiple Vitamin (MULTIVITAMIN) tablet Take 1 tablet by mouth daily.    . OXYGEN Inhale 2 L into the lungs continuous.    Marland Kitchen pyridoxine (B-6) 100 MG tablet Take 100 mg by mouth daily.    Marland Kitchen doxazosin (CARDURA) 4 MG tablet Take 1 tablet (4 mg total) by mouth daily. (Patient not taking: Reported on 01/21/2017) 30 tablet 5  . levofloxacin (LEVAQUIN) 500 MG tablet Take 1 tablet (500 mg total) by mouth daily. (Patient not taking: Reported on 01/05/2017) 10 tablet 0  . predniSONE (DELTASONE) 10 MG tablet     . predniSONE (DELTASONE) 20 MG tablet Take 2 daily for 3 days then take 1 daily for 3 days (Patient not taking: Reported on 01/05/2017) 9 tablet 0  . predniSONE (DELTASONE) 20 MG tablet Take 1 tablet (20 mg total) by mouth daily with breakfast. (Patient not taking: Reported on 01/21/2017) 5 tablet 0  . doxycycline (VIBRA-TABS) 100 MG tablet Take 1 tablet (100 mg total) by mouth 2 (two) times daily. (Patient not taking: Reported on 01/21/2017) 20 tablet 0   No facility-administered medications prior to visit.      Allergies:  Allergies  Allergen Reactions  . Aspirin Other (See Comments)    Upset stomach.  . Other Hypertension    Hazelnuts  . Penicillins Hives    Has patient had a PCN reaction causing immediate rash, facial/tongue/throat swelling, SOB or lightheadedness with hypotension: Yes Has patient had a PCN reaction causing severe rash involving mucus membranes or skin necrosis: No Has patient had a PCN reaction that required hospitalization No Has patient had a PCN reaction occurring within the last 10 years: No If all of the above answers are "NO", then may proceed with Cephalosporin use.   . Sulfa Antibiotics Hives      Review of Systems: See HPI for pertinent ROS. All other ROS negative.    Physical Exam: Blood pressure 126/70, pulse 71, temperature 97.6 F (36.4 C), temperature source Oral, resp. rate 16,  weight 166 lb 3.2 oz (75.4 kg), SpO2 93 %., Body mass index is 27.66 kg/m. General: WM wearing [place] Oxygen.  Appears in no acute distress. HEENT: Normocephalic, atraumatic, eyes without discharge, sclera non-icteric, nares are without discharge. Bilateral auditory canals clear, TM's are without perforation, pearly grey and translucent with reflective cone of light bilaterally. Oral cavity moist, posterior pharynx without exudate, erythema, peritonsillar abscess, or post nasal drip.  Neck: Supple. No thyromegaly. No lymphadenopathy. Lungs: Distant decreased breath sounds throughout bilaterally. No wheezes. Heart: Regular rhythm. No murmurs, rubs, or gallops. Msk:  Strength and tone normal for age. Extremities/Skin: Warm and dry. Neuro: Alert and oriented X 3. Moves all extremities spontaneously. Gait is normal. CNII-XII grossly in tact. Psych:  Responds to questions appropriately with a normal affect.     ASSESSMENT AND PLAN:  76 y.o. year old male with   1. COPD exacerbation (HCC) Continue all current medications including all current pulmonary meds. He is wearing his  nasal cannula oxygen. He is to add doxycycline and take as directed and complete all of it.  Will treat with another round of Doxycycline. 10 to follow-up if symptoms worsen or if they recur again. Follow up with [place] if symptoms worsen or develops fever or if symptoms do not resolve after completion of antibiotic. - doxycycline (VIBRA-TABS) 100 MG tablet; Take 1 tablet (100 mg total) by mouth 2 (two) times daily.  Dispense: 20 tablet; Refill: 0    Signed, [address] [place], [place], [facility] 01/21/2017 10:37 AM

## 2017-01-23 ENCOUNTER — Ambulatory Visit (INDEPENDENT_AMBULATORY_CARE_PROVIDER_SITE_OTHER): Payer: Medicare Other | Admitting: Family Medicine

## 2017-01-23 VITALS — BP 162/88 | HR 70 | Temp 97.6°F | Resp 20 | Ht 65.0 in

## 2017-01-23 DIAGNOSIS — I10 Essential (primary) hypertension: Secondary | ICD-10-CM

## 2017-01-23 DIAGNOSIS — R042 Hemoptysis: Secondary | ICD-10-CM

## 2017-01-23 DIAGNOSIS — J438 Other emphysema: Secondary | ICD-10-CM | POA: Diagnosis not present

## 2017-01-23 LAB — COMPLETE METABOLIC PANEL WITH GFR
ALT: 22 U/L (ref 9–46)
AST: 27 U/L (ref 10–35)
Albumin: 4.2 g/dL (ref 3.6–5.1)
Alkaline Phosphatase: 45 U/L (ref 40–115)
BUN: 23 mg/dL (ref 7–25)
CALCIUM: 10.2 mg/dL (ref 8.6–10.3)
CHLORIDE: 100 mmol/L (ref 98–110)
CO2: 27 mmol/L (ref 20–31)
CREATININE: 1.29 mg/dL — AB (ref 0.70–1.18)
GFR, EST AFRICAN AMERICAN: 61 mL/min (ref >=60)
GFR, EST NON AFRICAN AMERICAN: 53 mL/min — AB (ref >=60)
Glucose, Bld: 122 mg/dL — ABNORMAL HIGH (ref 70–99)
POTASSIUM: 3.8 mmol/L (ref 3.5–5.3)
Sodium: 138 mmol/L (ref 135–146)
Total Bilirubin: 0.7 mg/dL (ref 0.2–1.2)
Total Protein: 7 g/dL (ref 6.1–8.1)

## 2017-01-23 LAB — CBC WITH DIFFERENTIAL/PLATELET
BASOS ABS: 0 {cells}/uL (ref 0–200)
Basophils Relative: 0 %
EOS ABS: 142 {cells}/uL (ref 15–500)
Eosinophils Relative: 2 %
HEMATOCRIT: 43.6 % (ref 38.5–50.0)
Hemoglobin: 14.8 g/dL (ref 13.0–17.0)
LYMPHS PCT: 40 %
Lymphs Abs: 2840 cells/uL (ref 850–3900)
MCH: 30.3 pg (ref 27.0–33.0)
MCHC: 33.9 g/dL (ref 32.0–36.0)
MCV: 89.3 fL (ref 80.0–100.0)
MONO ABS: 639 {cells}/uL (ref 200–950)
MONOS PCT: 9 %
MPV: 10.2 fL (ref 7.5–12.5)
Neutro Abs: 3479 cells/uL (ref 1500–7800)
Neutrophils Relative %: 49 %
PLATELETS: 254 10*3/uL (ref 140–400)
RBC: 4.88 MIL/uL (ref 4.20–5.80)
RDW: 14.2 % (ref 11.0–15.0)
WBC: 7.1 10*3/uL (ref 3.8–10.8)

## 2017-01-23 MED ORDER — HYDRALAZINE HCL 50 MG PO TABS: 50.0000 mg | ORAL_TABLET | Freq: Two times a day (BID) | ORAL | 5 refills | Status: DC

## 2017-01-23 NOTE — Progress Notes (Signed)
Subjective:    Patient ID: Michael Evans, male    DOB: [birth year], 76 y.o.   MRN: [id number]  HPI Patient has a 50+ pack year history of smoking and severe emphysema with chronic O2 dependent respiratory failure. Over the last week, he has had 3 separate episodes of hemoptysis. There has been no fever. He denies any chest pain. He denies any shortness of breath beyond his baseline. His cough is noted different than his baseline cough. He is not wheezing today. He is not struggling to breathe. He did discontinue amlodipine due to swelling in his legs. He would like a different alternative Past Medical History:  Diagnosis Date  . Arthritis   . Chronic rhinitis   . COPD (chronic obstructive pulmonary disease) (HCC)    PFT 06-26-09 FEV1  1.75( 71%), FVC 3.65( 101%), FEV1% 48, TLC 5.53(104%), DLCO 55%, no BD  . Hypertension   . Nasal septal deviation   . Nocturia   . OSA (obstructive sleep apnea) 04/21/2011  . Skin cancer    Past Surgical History:  Procedure Laterality Date  . APPENDECTOMY    . BACK SURGERY    . CATARACT EXTRACTION W/PHACO  01/06/2013   Procedure: CATARACT EXTRACTION PHACO AND INTRAOCULAR LENS PLACEMENT (IOC);  Surgeon: Tonny Branch, MD;  Location: [facility];  Service: Ophthalmology;  Laterality: Right;  CDE: 22.17   Current Outpatient Prescriptions on File Prior to Visit  Medication Sig Dispense Refill  . albuterol (PROVENTIL HFA;VENTOLIN HFA) 108 (90 BASE) MCG/ACT inhaler Inhale 1-2 puffs into the lungs every 6 (six) hours as needed for wheezing or shortness of breath. 1 Inhaler 3  . albuterol (PROVENTIL) (2.5 MG/3ML) 0.083% nebulizer solution Take 3 mLs (2.5 mg total) by nebulization every 4 (four) hours. And as needed for shortness of breath 150 mL 11  . budesonide-formoterol (SYMBICORT) 160-4.5 MCG/ACT inhaler Inhale 2 puffs into the lungs 2 (two) times daily. 1 Inhaler 6  . cyanocobalamin 2000 MCG tablet Take 2,000 mcg by mouth daily.    Marland Kitchen doxazosin (CARDURA) 4 MG tablet  Take 1 tablet (4 mg total) by mouth daily. 30 tablet 5  . doxycycline (VIBRA-TABS) 100 MG tablet Take 1 tablet (100 mg total) by mouth 2 (two) times daily. 20 tablet 0  . hydrochlorothiazide (HYDRODIURIL) 25 MG tablet Take 1 tablet (25 mg total) by mouth daily. 90 tablet 3  . losartan (COZAAR) 50 MG tablet Take 1 tablet (50 mg total) by mouth daily. 90 tablet 3  . Multiple Vitamin (MULTIVITAMIN) tablet Take 1 tablet by mouth daily.    . OXYGEN Inhale 2 L into the lungs continuous.    Marland Kitchen pyridoxine (B-6) 100 MG tablet Take 100 mg by mouth daily.    Marland Kitchen amLODipine (NORVASC) 10 MG tablet Take 1 tablet (10 mg total) by mouth daily. (Patient not taking: Reported on 01/23/2017) 90 tablet 3   No current facility-administered medications on file prior to visit.    Allergies  Allergen Reactions  . Amlodipine Swelling    Swelling in feet  . Aspirin Other (See Comments)    Upset stomach.  . Other Hypertension    Hazelnuts  . Penicillins Hives    Has patient had a PCN reaction causing immediate rash, facial/tongue/throat swelling, SOB or lightheadedness with hypotension: Yes Has patient had a PCN reaction causing severe rash involving mucus membranes or skin necrosis: No Has patient had a PCN reaction that required hospitalization No Has patient had a PCN reaction occurring within the last 10  years: No If all of the above answers are "NO", then may proceed with Cephalosporin use.   . Sulfa Antibiotics Hives   Social History   Social History  . Marital status: Divorced    Spouse name: N/A  . Number of children: N/A  . Years of education: N/A   Occupational History  . 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 Smoking status: Former Smoker    Packs/day: 1.50    Years: 54.00    Types: Cigarettes    Quit date: 12/01/2001  . Smokeless tobacco: Never Used  . Alcohol use No  . Drug use: No  . Sexual activity: Not on file   Other Topics Concern  . Not on file   Social History  Narrative  . No narrative on file      Review of Systems  All other systems reviewed and are negative.      Objective:   Physical Exam  Cardiovascular: Normal rate, regular rhythm and normal heart sounds.   Pulmonary/Chest: No accessory muscle usage. No tachypnea. No respiratory distress. He has decreased breath sounds. He has no wheezes. He has no rhonchi. He has no rales.  Vitals reviewed.         Assessment & Plan:  Hemoptysis - Plan: CBC with Differential/Platelet, COMPLETE METABOLIC PANEL WITH GFR, CT Chest W Contrast  Other emphysema (HCC)  Benign essential HTN  Given his age, his past history of smoking, his emphysema, I will check a CT scan of the chest with contrast due to his hemoptysis. Consider bronchoscopy if persistent. Discontinue amlodipine and replaced with hydralazine 50 mg by mouth twice a day for hypertension

## 2017-01-26 ENCOUNTER — Ambulatory Visit
Admission: RE | Admit: 2017-01-26 | Discharge: 2017-01-26 | Disposition: A | Discharge status: Home / Self Care | Payer: Medicare Other | Source: Ambulatory Visit | Attending: Family Medicine | Admitting: Family Medicine

## 2017-01-26 DIAGNOSIS — R079 Chest pain, unspecified: Secondary | ICD-10-CM | POA: Diagnosis not present

## 2017-01-26 DIAGNOSIS — R042 Hemoptysis: Secondary | ICD-10-CM

## 2017-01-26 MED ORDER — IOPAMIDOL (ISOVUE-300) INJECTION 61%
75.0000 mL | Freq: Once | INTRAVENOUS | Status: AC | PRN
  Administered 2017-01-26: 75 mL via INTRAVENOUS

## 2017-04-11 IMAGING — DX DG CHEST 2V
2 series · 2 of 2 positions shown · non-contrast
Comparison: Chest radiograph performed 03/14/2015, and CT of the
chest performed 04/05/2015

CLINICAL DATA: Acute onset of productive cough, worsening shortness
of breath and lower chest pain. Indigestion. Initial encounter.

EXAM:
CHEST  2 VIEW

[chest pa]
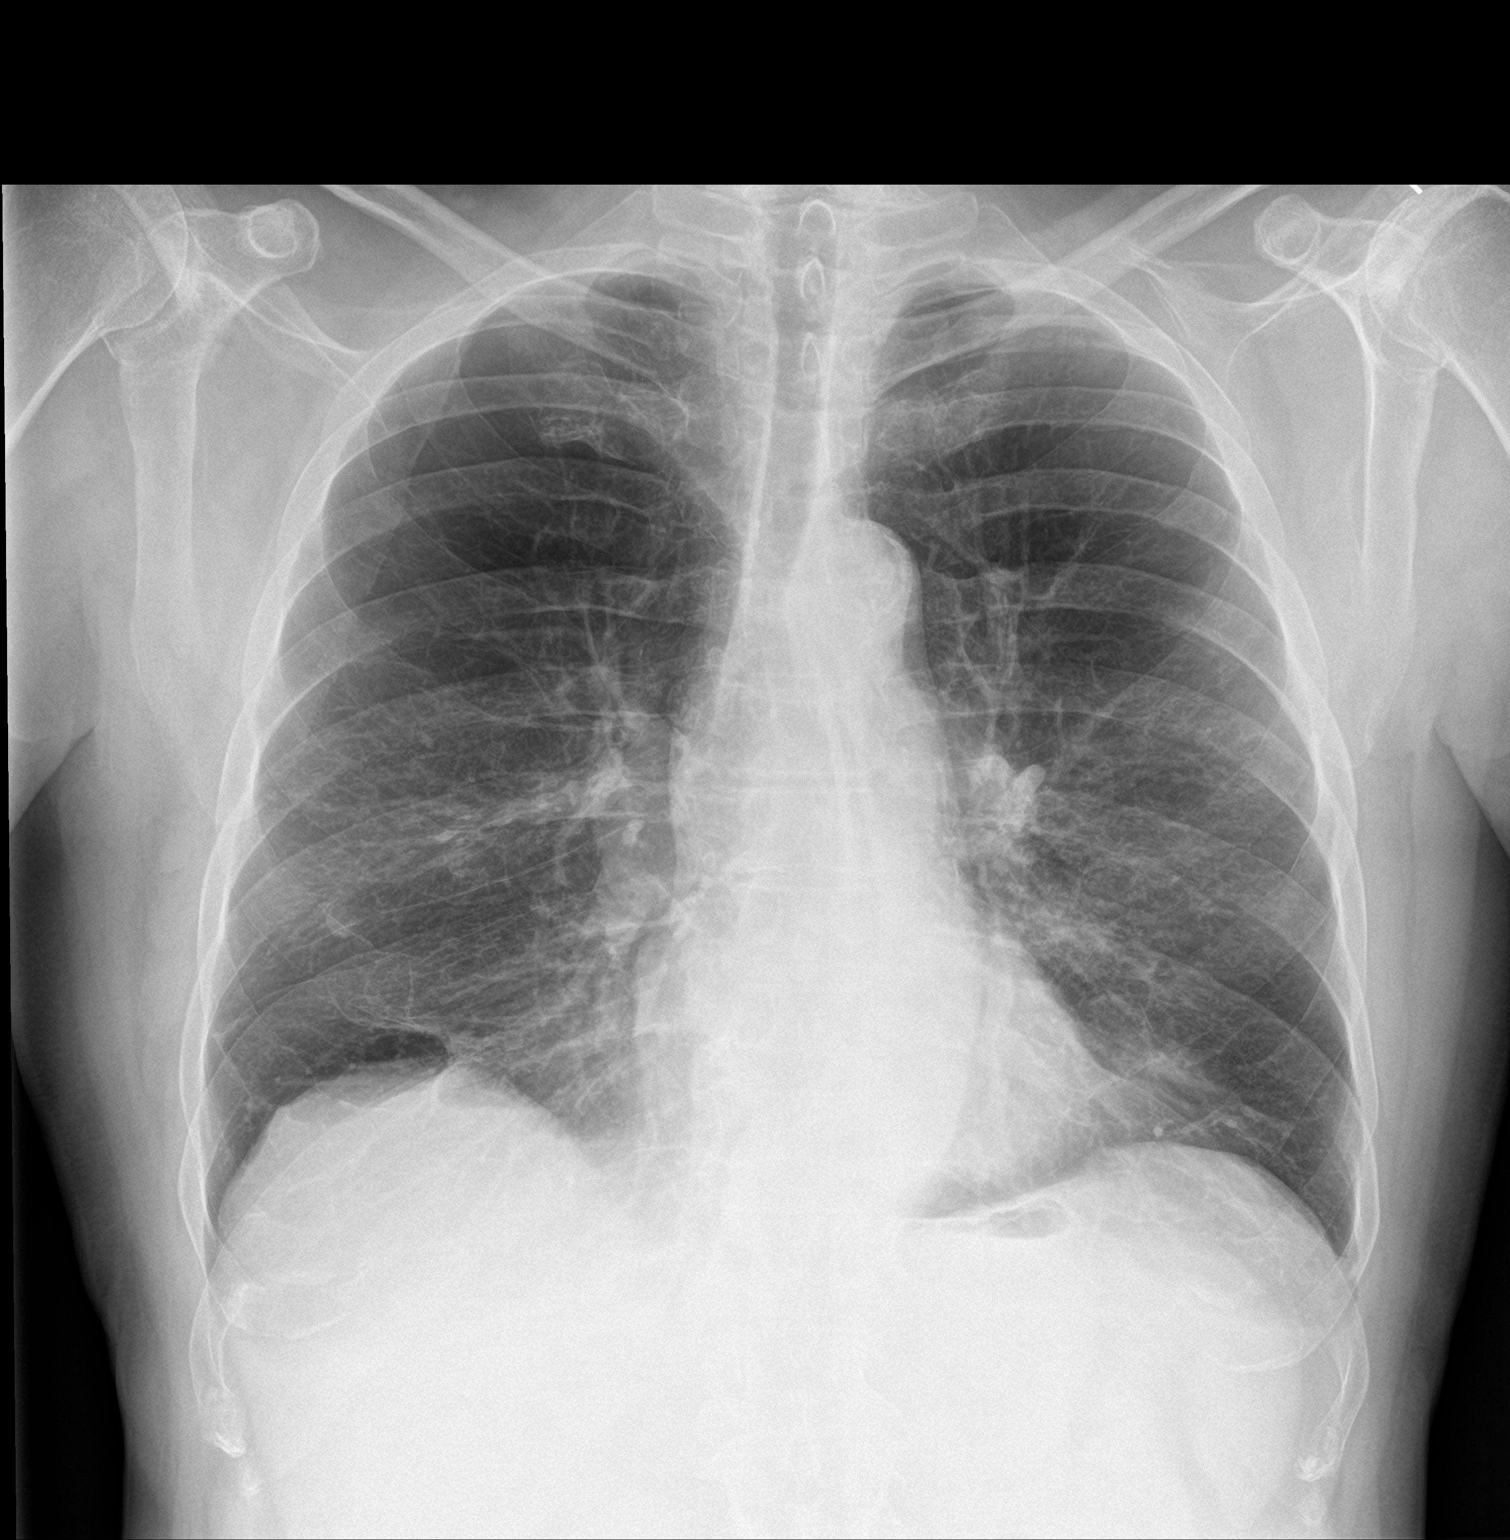

[chest lat]
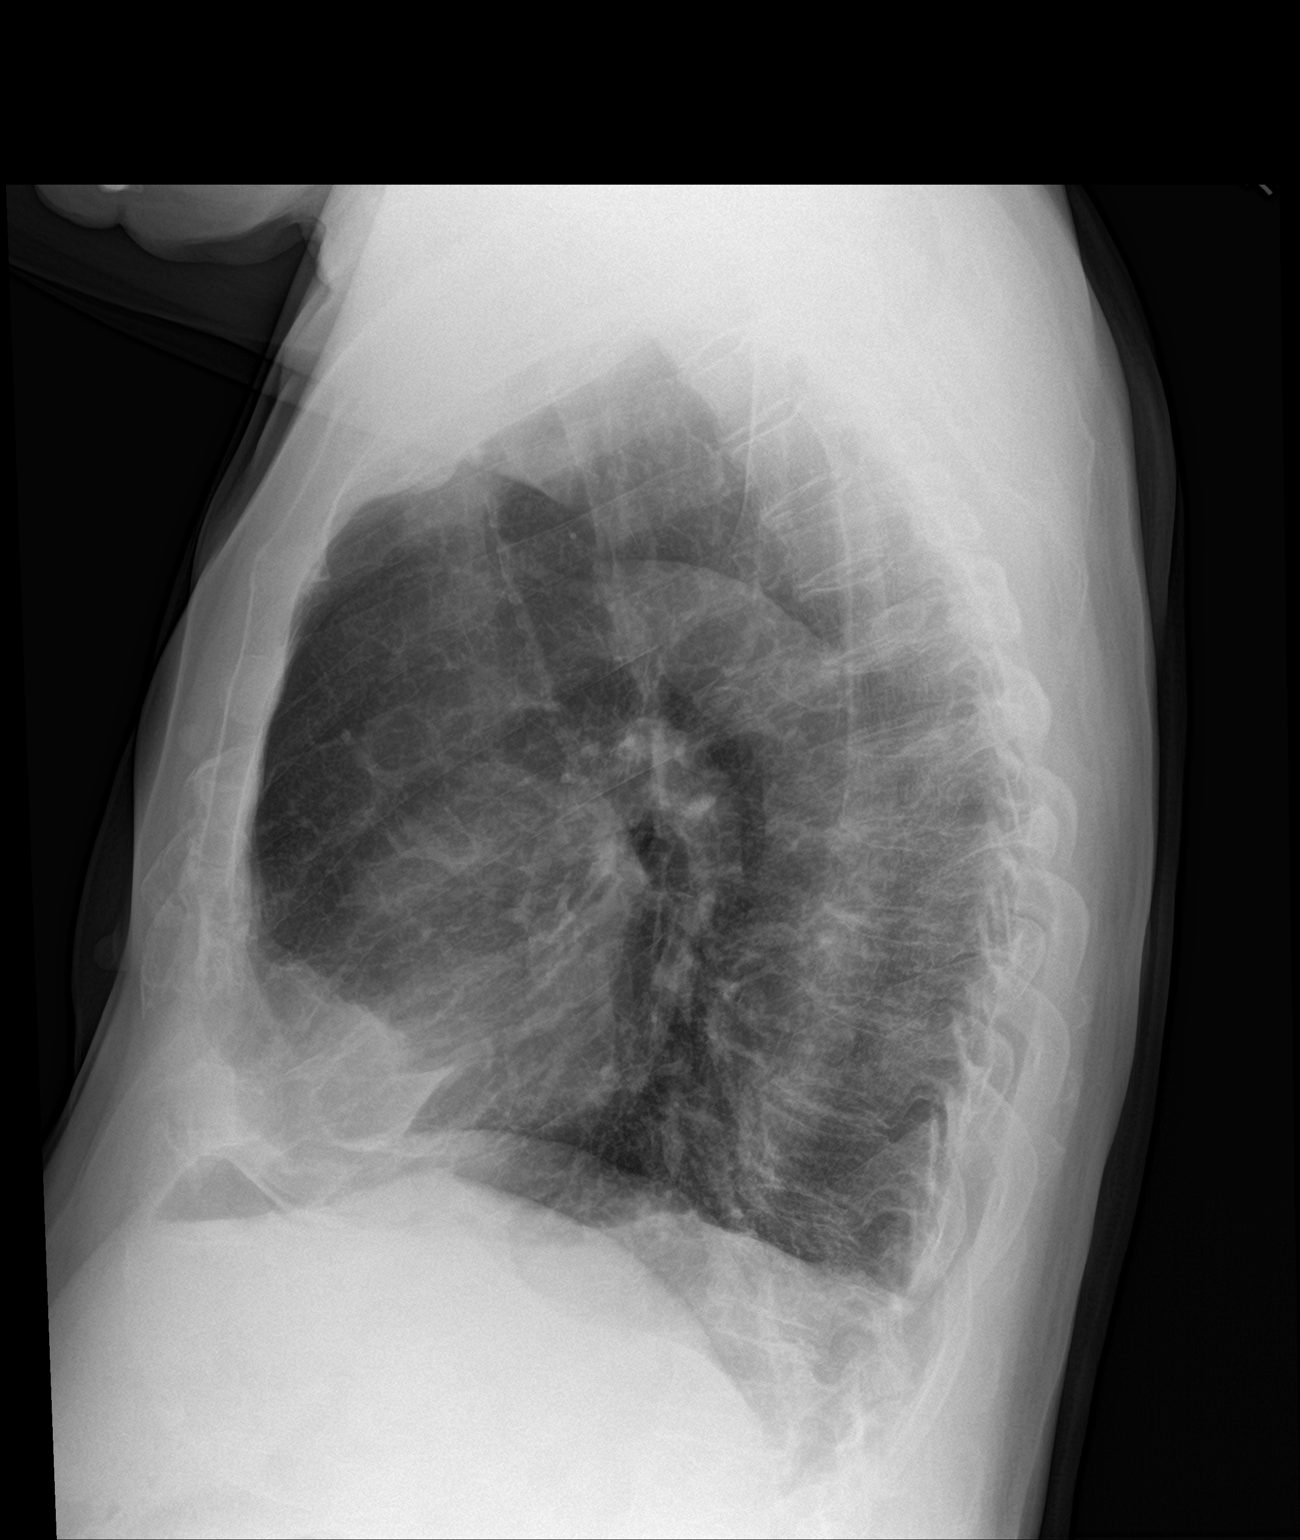

[2 of 2 positions shown; findings below may reference images not displayed]

FINDINGS: The lungs are well-aerated. Peribronchial thickening is noted. Mild
bilateral central airspace opacities could reflect mild pneumonia,
given the patient's symptoms. No pleural effusion or pneumothorax is
seen. A right-sided nipple shadow is noted.

The heart is normal in size; the mediastinal contour is within
normal limits. No acute osseous abnormalities are seen.
IMPRESSION: Peribronchial thickening noted. Mild bilateral central airspace
opacities could reflect mild pneumonia, given the patient's
symptoms.

## 2017-06-04 ENCOUNTER — Telehealth: Payer: Self-pay | Admitting: Family Medicine

## 2017-06-04 ENCOUNTER — Other Ambulatory Visit: Payer: Self-pay | Admitting: Family Medicine

## 2017-06-04 DIAGNOSIS — J441 Chronic obstructive pulmonary disease with (acute) exacerbation: Secondary | ICD-10-CM

## 2017-06-04 NOTE — Telephone Encounter (Signed)
Pt needs refill on symbicort

## 2017-06-04 NOTE — Telephone Encounter (Signed)
Medication called/sent to requested pharmacy  

## 2017-06-22 ENCOUNTER — Other Ambulatory Visit: Payer: Self-pay

## 2017-08-20 ENCOUNTER — Ambulatory Visit (INDEPENDENT_AMBULATORY_CARE_PROVIDER_SITE_OTHER): Payer: Medicare Other | Admitting: Family Medicine

## 2017-08-20 ENCOUNTER — Encounter: Payer: Self-pay | Admitting: Family Medicine

## 2017-08-20 VITALS — BP 136/88 | HR 92 | Temp 98.5°F | Resp 16 | Wt 155.0 lb

## 2017-08-20 DIAGNOSIS — J441 Chronic obstructive pulmonary disease with (acute) exacerbation: Secondary | ICD-10-CM | POA: Diagnosis not present

## 2017-08-20 MED ORDER — METHYLPREDNISOLONE ACETATE 80 MG/ML IJ SUSP
60.0000 mg | Freq: Once | INTRAMUSCULAR | Status: AC
  Administered 2017-08-20: 60 mg via INTRAMUSCULAR

## 2017-08-20 MED ORDER — PREDNISONE 20 MG PO TABS
60.0000 mg | ORAL_TABLET | Freq: Every day | ORAL | 0 refills | Status: DC
Start: 2017-08-20 — End: 2017-10-02

## 2017-08-20 MED ORDER — LEVOFLOXACIN 500 MG PO TABS: 500.0000 mg | ORAL_TABLET | Freq: Every day | ORAL | 0 refills | Status: DC

## 2017-08-20 NOTE — Progress Notes (Signed)
Subjective:    Patient ID: Michael Evans, male    DOB: Sep 22, 1941, 76 y.o.   MRN: [id number]  HPI Patient has a history of COPD with frequent COPD exacerbations.  He is currently compliant with Symbicort. He uses albuterol 2.5 mg nebs at night prior to going to bed. Over the last 3-4 months, he reports gradually increasing chest congestion. This acutely worsened over the last week. He reports increasing dyspnea on exertion. He reports increasing cough. He reports increasing wheezing. He reports increasing shortness of breath. All this was over the last week. He also reports an increase in the sputum production as well as in its characteristics. Usually he has trace clear sputum. Now he has copious green sputum. On exam, he has diminished breath sounds bilaterally with expiratory wheezing and rhonchorous breath sounds. Past Medical History:  Diagnosis Date  . Arthritis   . Chronic rhinitis   . COPD (chronic obstructive pulmonary disease) (HCC)    PFT 06-26-09 FEV1  1.75( 71%), FVC 3.65( 101%), FEV1% 48, TLC 5.53(104%), DLCO 55%, no BD  . Hypertension   . Nasal septal deviation   . Nocturia   . OSA (obstructive sleep apnea) 04/21/2011  . Skin cancer    Past Surgical History:  Procedure Laterality Date  . APPENDECTOMY    . BACK SURGERY    . CATARACT EXTRACTION W/PHACO  01/06/2013   Procedure: CATARACT EXTRACTION PHACO AND INTRAOCULAR LENS PLACEMENT (IOC);  Surgeon: Tonny Branch, MD;  Location: [facility];  Service: Ophthalmology;  Laterality: Right;  CDE: 22.17   Current Outpatient Prescriptions on File Prior to Visit  Medication Sig Dispense Refill  . albuterol (PROVENTIL HFA;VENTOLIN HFA) 108 (90 BASE) MCG/ACT inhaler Inhale 1-2 puffs into the lungs every 6 (six) hours as needed for wheezing or shortness of breath. 1 Inhaler 3  . albuterol (PROVENTIL) (2.5 MG/3ML) 0.083% nebulizer solution Take 3 mLs (2.5 mg total) by nebulization every 4 (four) hours. And as needed for shortness of breath 150 mL  11  . cyanocobalamin 2000 MCG tablet Take 2,000 mcg by mouth daily.    . hydrALAZINE (APRESOLINE) 50 MG tablet Take 1 tablet (50 mg total) by mouth 2 (two) times daily with a meal. 60 tablet 5  . Multiple Vitamin (MULTIVITAMIN) tablet Take 1 tablet by mouth daily.    . OXYGEN Inhale 2 L into the lungs continuous.    Marland Kitchen pyridoxine (B-6) 100 MG tablet Take 100 mg by mouth daily.    . SYMBICORT 160-4.5 MCG/ACT inhaler INHALE 2 PUFFS INTO THE LUNGS 2 (TWO) TIMES DAILY. 10.2 Inhaler 5  . amLODipine (NORVASC) 10 MG tablet Take 1 tablet (10 mg total) by mouth daily. (Patient not taking: Reported on 08/20/2017) 90 tablet 3  . doxazosin (CARDURA) 4 MG tablet Take 1 tablet (4 mg total) by mouth daily. (Patient not taking: Reported on 08/20/2017) 30 tablet 5  . hydrochlorothiazide (HYDRODIURIL) 25 MG tablet Take 1 tablet (25 mg total) by mouth daily. (Patient not taking: Reported on 08/20/2017) 90 tablet 3  . losartan (COZAAR) 50 MG tablet Take 1 tablet (50 mg total) by mouth daily. (Patient not taking: Reported on 08/20/2017) 90 tablet 3   No current facility-administered medications on file prior to visit.    Allergies  Allergen Reactions  . Amlodipine Swelling    Swelling in feet  . Aspirin Other (See Comments)    Upset stomach.  . Other Hypertension    Hazelnuts  . Penicillins Hives    Has patient  had a PCN reaction causing immediate rash, facial/tongue/throat swelling, SOB or lightheadedness with hypotension: Yes Has patient had a PCN reaction causing severe rash involving mucus membranes or skin necrosis: No Has patient had a PCN reaction that required hospitalization No Has patient had a PCN reaction occurring within the last 10 years: No If all of the above answers are "NO", then may proceed with Cephalosporin use.   . Sulfa Antibiotics Hives   Social History   Social History  . Marital status: Divorced    Spouse name: N/A  . Number of children: N/A  . Years of education: N/A    Occupational History  . 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 Smoking status: Former Smoker    Packs/day: 1.50    Years: 54.00    Types: Cigarettes    Quit date: 12/01/2001  . Smokeless tobacco: Never Used  . Alcohol use No  . Drug use: No  . Sexual activity: Not on file   Other Topics Concern  . Not on file   Social History Narrative  . No narrative on file      Review of Systems  Respiratory: Positive for cough.   All other systems reviewed and are negative.      Objective:   Physical Exam  Cardiovascular: Normal rate, regular rhythm and normal heart sounds.   Pulmonary/Chest: No respiratory distress. He has wheezes in the right upper field, the right lower field, the left upper field and the left lower field. He has rhonchi. He has no rales.  Abdominal: Soft. Bowel sounds are normal. He exhibits no distension. There is no tenderness. There is no rebound and no guarding.  Musculoskeletal: He exhibits no edema.  Vitals reviewed.         Assessment & Plan:  COPD exacerbation ([place]) - Plan: predniSONE (DELTASONE) 20 MG tablet, levofloxacin (LEVAQUIN) 500 MG tablet, methylPREDNISolone acetate (DEPO-MEDROL) injection 60 mg  I will start the patient on Levaquin 500 mg by mouth daily for 7 days. He received 60 mg of Depo-Medrol IM 1. He will start prednisone 60 mg by mouth daily for the next 5 days beginning tomorrow. I will see the patient back on Monday and at that time if he is doing better I will wean him down gradually off prednisone. Begin albuterol 2.5 mg nebs every 6 hours. At present he is only using it once at night. Continue Symbicort.

## 2017-08-24 ENCOUNTER — Ambulatory Visit (INDEPENDENT_AMBULATORY_CARE_PROVIDER_SITE_OTHER): Payer: Medicare Other | Admitting: Family Medicine

## 2017-08-24 ENCOUNTER — Encounter: Payer: Self-pay | Admitting: Family Medicine

## 2017-08-24 VITALS — BP 150/100 | HR 92 | Temp 98.0°F | Resp 18 | Ht 65.0 in | Wt 160.0 lb

## 2017-08-24 DIAGNOSIS — J441 Chronic obstructive pulmonary disease with (acute) exacerbation: Secondary | ICD-10-CM | POA: Diagnosis not present

## 2017-08-24 MED ORDER — PREDNISONE 20 MG PO TABS: ORAL_TABLET | ORAL | 0 refills | Status: DC

## 2017-08-24 NOTE — Progress Notes (Signed)
Subjective:    Patient ID: Michael Evans, male    DOB: 1941-04-23, 76 y.o.   MRN: [id number]  Cough   08/20/17 Patient has a history of COPD with frequent COPD exacerbations.  He is currently compliant with Symbicort. He uses albuterol 2.5 mg nebs at night prior to going to bed. Over the last 3-4 months, he reports gradually increasing chest congestion. This acutely worsened over the last week. He reports increasing dyspnea on exertion. He reports increasing cough. He reports increasing wheezing. He reports increasing shortness of breath. All this was over the last week. He also reports an increase in the sputum production as well as in its characteristics. Usually he has trace clear sputum. Now he has copious green sputum. On exam, he has diminished breath sounds bilaterally with expiratory wheezing and rhonchorous breath sounds.  At that time, my plan was:  I will start the patient on Levaquin 500 mg by mouth daily for 7 days. He received 60 mg of Depo-Medrol IM 1. He will start prednisone 60 mg by mouth daily for the next 5 days beginning tomorrow. I will see the patient back on Monday and at that time if he is doing better I will wean him down gradually off prednisone. Begin albuterol 2.5 mg nebs every 6 hours. At present he is only using it once at night. Continue Symbicort.  08/24/17 Patient is much better.  Previously could barely walk across the parking lot. Now his breathing has improved dramatically and he is approaching his baseline. However in the past, the patient has rebounded due to abrupt discontinuation of steroids. Patient typically does better when he is gradually weaned off prednisone versus abrupt discontinuation. He states that he is producing yellow sputum and copious amounts when he coughs. He is happy with this because now he is able to bring it up whereas before he could not generate enough pressure by coughing to break up the chest congestion and cough it up. Past Medical  History:  Diagnosis Date  . Arthritis   . Chronic rhinitis   . COPD (chronic obstructive pulmonary disease) (HCC)    PFT 06-26-09 FEV1  1.75( 71%), FVC 3.65( 101%), FEV1% 48, TLC 5.53(104%), DLCO 55%, no BD  . Hypertension   . Nasal septal deviation   . Nocturia   . OSA (obstructive sleep apnea) 04/21/2011  . Skin cancer    Past Surgical History:  Procedure Laterality Date  . APPENDECTOMY    . BACK SURGERY    . CATARACT EXTRACTION W/PHACO  01/06/2013   Procedure: CATARACT EXTRACTION PHACO AND INTRAOCULAR LENS PLACEMENT (IOC);  Surgeon: Tonny Branch, MD;  Location: [facility];  Service: Ophthalmology;  Laterality: Right;  CDE: 22.17   Current Outpatient Prescriptions on File Prior to Visit  Medication Sig Dispense Refill  . albuterol (PROVENTIL HFA;VENTOLIN HFA) 108 (90 BASE) MCG/ACT inhaler Inhale 1-2 puffs into the lungs every 6 (six) hours as needed for wheezing or shortness of breath. 1 Inhaler 3  . albuterol (PROVENTIL) (2.5 MG/3ML) 0.083% nebulizer solution Take 3 mLs (2.5 mg total) by nebulization every 4 (four) hours. And as needed for shortness of breath 150 mL 11  . amLODipine (NORVASC) 10 MG tablet Take 1 tablet (10 mg total) by mouth daily. (Patient not taking: Reported on 08/20/2017) 90 tablet 3  . cyanocobalamin 2000 MCG tablet Take 2,000 mcg by mouth daily.    Marland Kitchen doxazosin (CARDURA) 4 MG tablet Take 1 tablet (4 mg total) by mouth daily. (Patient not  taking: Reported on 08/20/2017) 30 tablet 5  . hydrALAZINE (APRESOLINE) 50 MG tablet Take 1 tablet (50 mg total) by mouth 2 (two) times daily with a meal. 60 tablet 5  . hydrochlorothiazide (HYDRODIURIL) 25 MG tablet Take 1 tablet (25 mg total) by mouth daily. (Patient not taking: Reported on 08/20/2017) 90 tablet 3  . levofloxacin (LEVAQUIN) 500 MG tablet Take 1 tablet (500 mg total) by mouth daily. 7 tablet 0  . losartan (COZAAR) 50 MG tablet Take 1 tablet (50 mg total) by mouth daily. (Patient not taking: Reported on 08/20/2017) 90 tablet 3   . Multiple Vitamin (MULTIVITAMIN) tablet Take 1 tablet by mouth daily.    . OXYGEN Inhale 2 L into the lungs continuous.    . predniSONE (DELTASONE) 20 MG tablet Take 3 tablets (60 mg total) by mouth daily with breakfast. 15 tablet 0  . pyridoxine (B-6) 100 MG tablet Take 100 mg by mouth daily.    . SYMBICORT 160-4.5 MCG/ACT inhaler INHALE 2 PUFFS INTO THE LUNGS 2 (TWO) TIMES DAILY. 10.2 Inhaler 5   No current facility-administered medications on file prior to visit.    Allergies  Allergen Reactions  . Amlodipine Swelling    Swelling in feet  . Aspirin Other (See Comments)    Upset stomach.  . Other Hypertension    Hazelnuts  . Penicillins Hives    Has patient had a PCN reaction causing immediate rash, facial/tongue/throat swelling, SOB or lightheadedness with hypotension: Yes Has patient had a PCN reaction causing severe rash involving mucus membranes or skin necrosis: No Has patient had a PCN reaction that required hospitalization No Has patient had a PCN reaction occurring within the last 10 years: No If all of the above answers are "NO", then may proceed with Cephalosporin use.   . Sulfa Antibiotics Hives   Social History   Social History  . Marital status: Divorced    Spouse name: N/A  . Number of children: N/A  . Years of education: N/A   Occupational History  . 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 Smoking status: Former Smoker    Packs/day: 1.50    Years: 54.00    Types: Cigarettes    Quit date: 12/01/2001  . Smokeless tobacco: Never Used  . Alcohol use No  . Drug use: No  . Sexual activity: Not on file   Other Topics Concern  . Not on file   Social History Narrative  . No narrative on file      Review of Systems  Respiratory: Positive for cough.   All other systems reviewed and are negative.      Objective:   Physical Exam  Cardiovascular: Normal rate, regular rhythm and normal heart sounds.   Pulmonary/Chest: No respiratory  distress. He has decreased breath sounds. He has no wheezes. He has no rhonchi. He has no rales.  Abdominal: Soft. Bowel sounds are normal. He exhibits no distension. There is no tenderness. There is no rebound and no guarding.  Musculoskeletal: He exhibits no edema.  Vitals reviewed.         Assessment & Plan:  COPD exacerbation ([place]) - Plan: predniSONE (DELTASONE) 20 MG tablet  COPD exacerbation is improving.  Wean down gradually.  Start prednisone 40 mg poqday for 5 days and then 20 mg poqday for 5 days.  Use albuterol as needed.

## 2017-09-21 ENCOUNTER — Ambulatory Visit (INDEPENDENT_AMBULATORY_CARE_PROVIDER_SITE_OTHER): Payer: Medicare Other | Admitting: Family Medicine

## 2017-09-21 DIAGNOSIS — Z23 Encounter for immunization: Secondary | ICD-10-CM

## 2017-09-26 ENCOUNTER — Inpatient Hospital Stay (HOSPITAL_COMMUNITY)
Admission: EM | Admit: 2017-09-26 | Discharge: 2017-10-02 | DRG: 190 | Disposition: A | Discharge status: Home / Self Care | Payer: Medicare Other | Attending: Internal Medicine | Admitting: Internal Medicine

## 2017-09-26 ENCOUNTER — Encounter (HOSPITAL_COMMUNITY): Payer: Self-pay | Admitting: Emergency Medicine

## 2017-09-26 ENCOUNTER — Emergency Department (HOSPITAL_COMMUNITY): Payer: Medicare Other

## 2017-09-26 DIAGNOSIS — Z87891 Personal history of nicotine dependence: Secondary | ICD-10-CM | POA: Diagnosis not present

## 2017-09-26 DIAGNOSIS — R739 Hyperglycemia, unspecified: Secondary | ICD-10-CM | POA: Diagnosis not present

## 2017-09-26 DIAGNOSIS — R0602 Shortness of breath: Secondary | ICD-10-CM | POA: Diagnosis not present

## 2017-09-26 DIAGNOSIS — Z7951 Long term (current) use of inhaled steroids: Secondary | ICD-10-CM

## 2017-09-26 DIAGNOSIS — J441 Chronic obstructive pulmonary disease with (acute) exacerbation: Principal | ICD-10-CM

## 2017-09-26 DIAGNOSIS — E872 Acidosis: Secondary | ICD-10-CM | POA: Diagnosis present

## 2017-09-26 DIAGNOSIS — G4733 Obstructive sleep apnea (adult) (pediatric): Secondary | ICD-10-CM | POA: Diagnosis present

## 2017-09-26 DIAGNOSIS — J439 Emphysema, unspecified: Secondary | ICD-10-CM

## 2017-09-26 DIAGNOSIS — Z7952 Long term (current) use of systemic steroids: Secondary | ICD-10-CM | POA: Diagnosis not present

## 2017-09-26 DIAGNOSIS — R000 Tachycardia, unspecified: Secondary | ICD-10-CM | POA: Diagnosis not present

## 2017-09-26 DIAGNOSIS — R6510 Systemic inflammatory response syndrome (SIRS) of non-infectious origin without acute organ dysfunction: Secondary | ICD-10-CM | POA: Diagnosis present

## 2017-09-26 DIAGNOSIS — I10 Essential (primary) hypertension: Secondary | ICD-10-CM | POA: Diagnosis not present

## 2017-09-26 DIAGNOSIS — Z85828 Personal history of other malignant neoplasm of skin: Secondary | ICD-10-CM | POA: Diagnosis not present

## 2017-09-26 DIAGNOSIS — J9621 Acute and chronic respiratory failure with hypoxia: Secondary | ICD-10-CM | POA: Diagnosis not present

## 2017-09-26 DIAGNOSIS — T380X5A Adverse effect of glucocorticoids and synthetic analogues, initial encounter: Secondary | ICD-10-CM | POA: Diagnosis present

## 2017-09-26 DIAGNOSIS — Z9981 Dependence on supplemental oxygen: Secondary | ICD-10-CM

## 2017-09-26 DIAGNOSIS — R0902 Hypoxemia: Secondary | ICD-10-CM | POA: Diagnosis not present

## 2017-09-26 DIAGNOSIS — Z79899 Other long term (current) drug therapy: Secondary | ICD-10-CM

## 2017-09-26 DIAGNOSIS — Z801 Family history of malignant neoplasm of trachea, bronchus and lung: Secondary | ICD-10-CM | POA: Diagnosis not present

## 2017-09-26 DIAGNOSIS — R05 Cough: Secondary | ICD-10-CM | POA: Diagnosis not present

## 2017-09-26 HISTORY — DX: Dependence on supplemental oxygen: Z99.81

## 2017-09-26 LAB — LACTIC ACID, PLASMA
LACTIC ACID, VENOUS: 4.5 mmol/L — AB (ref 0.5–1.9)
LACTIC ACID, VENOUS: 7.4 mmol/L — AB (ref 0.5–1.9)
Lactic Acid, Venous: 2.7 mmol/L (ref 0.5–1.9)
Lactic Acid, Venous: 7.6 mmol/L (ref 0.5–1.9)

## 2017-09-26 LAB — CBC WITH DIFFERENTIAL/PLATELET
BASOS PCT: 0 %
Basophils Absolute: 0 10*3/uL (ref 0.0–0.1)
Eosinophils Absolute: 0 10*3/uL (ref 0.0–0.7)
Eosinophils Relative: 0 %
HEMATOCRIT: 44.7 % (ref 39.0–52.0)
HEMOGLOBIN: 15.1 g/dL (ref 13.0–17.0)
LYMPHS ABS: 1.4 10*3/uL (ref 0.7–4.0)
LYMPHS PCT: 8 %
MCH: 30.4 pg (ref 26.0–34.0)
MCHC: 33.8 g/dL (ref 30.0–36.0)
MCV: 89.9 fL (ref 78.0–100.0)
MONO ABS: 1.7 10*3/uL — AB (ref 0.1–1.0)
MONOS PCT: 9 %
NEUTROS ABS: 15 10*3/uL — AB (ref 1.7–7.7)
NEUTROS PCT: 83 %
Platelets: 293 10*3/uL (ref 150–400)
RBC: 4.97 MIL/uL (ref 4.22–5.81)
RDW: 13.7 % (ref 11.5–15.5)
WBC: 18.2 10*3/uL — ABNORMAL HIGH (ref 4.0–10.5)

## 2017-09-26 LAB — GLUCOSE, CAPILLARY: Glucose-Capillary: 266 mg/dL — ABNORMAL HIGH (ref 65–99)

## 2017-09-26 LAB — BASIC METABOLIC PANEL
ANION GAP: 12 (ref 5–15)
BUN: 15 mg/dL (ref 6–20)
CO2: 27 mmol/L (ref 22–32)
Calcium: 9.7 mg/dL (ref 8.9–10.3)
Chloride: 101 mmol/L (ref 101–111)
Creatinine, Ser: 1.05 mg/dL (ref 0.61–1.24)
GFR calc Af Amer: >60 mL/min (ref >60)
GLUCOSE: 155 mg/dL — AB (ref 65–99)
POTASSIUM: 4.2 mmol/L (ref 3.5–5.1)
Sodium: 140 mmol/L (ref 135–145)

## 2017-09-26 LAB — MRSA PCR SCREENING: MRSA BY PCR: NEGATIVE

## 2017-09-26 LAB — TROPONIN I: Troponin I: <0.03 ng/mL (ref <0.03)

## 2017-09-26 MED ORDER — ALBUTEROL SULFATE (2.5 MG/3ML) 0.083% IN NEBU
2.5000 mg | INHALATION_SOLUTION | Freq: Once | RESPIRATORY_TRACT | Status: AC
  Administered 2017-09-26: 2.5 mg via RESPIRATORY_TRACT
  Filled 2017-09-26: qty 3

## 2017-09-26 MED ORDER — POLYETHYLENE GLYCOL 3350 17 G PO PACK: 17.0000 g | PACK | Freq: Every day | ORAL | Status: DC | PRN

## 2017-09-26 MED ORDER — LEVOFLOXACIN IN D5W 750 MG/150ML IV SOLN
INTRAVENOUS | Status: AC
  Filled 2017-09-26: qty 150

## 2017-09-26 MED ORDER — ONDANSETRON HCL 4 MG PO TABS: 4.0000 mg | ORAL_TABLET | Freq: Four times a day (QID) | ORAL | Status: DC | PRN

## 2017-09-26 MED ORDER — MOMETASONE FURO-FORMOTEROL FUM 200-5 MCG/ACT IN AERO
2.0000 | INHALATION_SPRAY | Freq: Two times a day (BID) | RESPIRATORY_TRACT | Status: DC
  Administered 2017-09-26 – 2017-10-02 (×12): 2 via RESPIRATORY_TRACT
  Filled 2017-09-26 (×2): qty 8.8

## 2017-09-26 MED ORDER — IPRATROPIUM-ALBUTEROL 0.5-2.5 (3) MG/3ML IN SOLN
3.0000 mL | Freq: Once | RESPIRATORY_TRACT | Status: AC
  Administered 2017-09-26: 3 mL via RESPIRATORY_TRACT
  Filled 2017-09-26: qty 3

## 2017-09-26 MED ORDER — SODIUM CHLORIDE 0.9 % IV BOLUS (SEPSIS)
1000.0000 mL | Freq: Once | INTRAVENOUS | Status: AC
  Administered 2017-09-26: 1000 mL via INTRAVENOUS

## 2017-09-26 MED ORDER — GUAIFENESIN ER 600 MG PO TB12
600.0000 mg | ORAL_TABLET | Freq: Two times a day (BID) | ORAL | Status: DC
  Administered 2017-09-26: 600 mg via ORAL
  Filled 2017-09-26: qty 1

## 2017-09-26 MED ORDER — METHYLPREDNISOLONE SODIUM SUCC 125 MG IJ SOLR
125.0000 mg | Freq: Once | INTRAMUSCULAR | Status: AC
  Administered 2017-09-26: 125 mg via INTRAVENOUS
  Filled 2017-09-26: qty 2

## 2017-09-26 MED ORDER — METHYLPREDNISOLONE SODIUM SUCC 125 MG IJ SOLR
60.0000 mg | Freq: Four times a day (QID) | INTRAMUSCULAR | Status: DC
  Administered 2017-09-26 – 2017-09-27 (×3): 60 mg via INTRAVENOUS
  Filled 2017-09-26 (×3): qty 2

## 2017-09-26 MED ORDER — INSULIN ASPART 100 UNIT/ML ~~LOC~~ SOLN
0.0000 [IU] | Freq: Every day | SUBCUTANEOUS | Status: DC
  Administered 2017-09-26: 3 [IU] via SUBCUTANEOUS
  Administered 2017-10-01: 2 [IU] via SUBCUTANEOUS

## 2017-09-26 MED ORDER — INSULIN ASPART 100 UNIT/ML ~~LOC~~ SOLN
0.0000 [IU] | Freq: Three times a day (TID) | SUBCUTANEOUS | Status: DC
  Administered 2017-09-27: 8 [IU] via SUBCUTANEOUS
  Administered 2017-09-27 (×2): 3 [IU] via SUBCUTANEOUS
  Administered 2017-09-28: 5 [IU] via SUBCUTANEOUS
  Administered 2017-09-28: 8 [IU] via SUBCUTANEOUS
  Administered 2017-09-28: 2 [IU] via SUBCUTANEOUS
  Administered 2017-09-29 (×3): 3 [IU] via SUBCUTANEOUS
  Administered 2017-09-30: 2 [IU] via SUBCUTANEOUS
  Administered 2017-09-30: 5 [IU] via SUBCUTANEOUS
  Administered 2017-10-01: 3 [IU] via SUBCUTANEOUS
  Administered 2017-10-01: 11 [IU] via SUBCUTANEOUS
  Administered 2017-10-02 (×2): 5 [IU] via SUBCUTANEOUS
  Administered 2017-10-02: 11 [IU] via SUBCUTANEOUS

## 2017-09-26 MED ORDER — ONDANSETRON HCL 4 MG/2ML IJ SOLN: 4.0000 mg | Freq: Four times a day (QID) | INTRAMUSCULAR | Status: DC | PRN

## 2017-09-26 MED ORDER — SODIUM CHLORIDE 0.9 % IV BOLUS (SEPSIS): 1000.0000 mL | Freq: Once | INTRAVENOUS | Status: DC

## 2017-09-26 MED ORDER — SODIUM CHLORIDE 0.9 % IV SOLN
INTRAVENOUS | Status: DC
  Administered 2017-09-26 – 2017-09-27 (×2): via INTRAVENOUS

## 2017-09-26 MED ORDER — LEVOFLOXACIN IN D5W 750 MG/150ML IV SOLN
750.0000 mg | INTRAVENOUS | Status: DC
  Administered 2017-09-26: 750 mg via INTRAVENOUS
  Filled 2017-09-26: qty 150

## 2017-09-26 MED ORDER — ALBUTEROL SULFATE (2.5 MG/3ML) 0.083% IN NEBU
2.5000 mg | INHALATION_SOLUTION | Freq: Four times a day (QID) | RESPIRATORY_TRACT | Status: DC
  Administered 2017-09-26 – 2017-09-27 (×3): 2.5 mg via RESPIRATORY_TRACT
  Filled 2017-09-26 (×3): qty 3

## 2017-09-26 MED ORDER — ENOXAPARIN SODIUM 40 MG/0.4ML ~~LOC~~ SOLN
40.0000 mg | SUBCUTANEOUS | Status: DC
  Administered 2017-09-26 – 2017-09-30 (×5): 40 mg via SUBCUTANEOUS
  Filled 2017-09-26 (×6): qty 0.4

## 2017-09-26 MED ORDER — TIOTROPIUM BROMIDE MONOHYDRATE 18 MCG IN CAPS
18.0000 ug | ORAL_CAPSULE | Freq: Every day | RESPIRATORY_TRACT | Status: DC
  Filled 2017-09-26: qty 5

## 2017-09-26 MED ORDER — ALBUTEROL SULFATE (2.5 MG/3ML) 0.083% IN NEBU: 2.5000 mg | INHALATION_SOLUTION | RESPIRATORY_TRACT | Status: DC | PRN

## 2017-09-26 MED ORDER — SODIUM CHLORIDE 0.9 % IV BOLUS (SEPSIS)
500.0000 mL | Freq: Once | INTRAVENOUS | Status: AC
  Administered 2017-09-26: 500 mL via INTRAVENOUS

## 2017-09-26 MED ORDER — ACETAMINOPHEN 325 MG PO TABS: 650.0000 mg | ORAL_TABLET | Freq: Four times a day (QID) | ORAL | Status: DC | PRN

## 2017-09-26 MED ORDER — ACETAMINOPHEN 650 MG RE SUPP: 650.0000 mg | Freq: Four times a day (QID) | RECTAL | Status: DC | PRN

## 2017-09-26 MED ORDER — HYDRALAZINE HCL 25 MG PO TABS
50.0000 mg | ORAL_TABLET | Freq: Two times a day (BID) | ORAL | Status: DC
  Administered 2017-09-27 – 2017-10-02 (×11): 50 mg via ORAL
  Filled 2017-09-26 (×12): qty 2

## 2017-09-26 NOTE — Progress Notes (Signed)
Lactic acid 7.4 is improved over last value.

## 2017-09-26 NOTE — ED Notes (Signed)
CRITICAL VALUE ALERT  Critical Value:  Lactic 4.5  Date & Time Notied:  09/26/17 at 1708  Provider Notified: Lattie Haw, RN on 300.

## 2017-09-26 NOTE — ED Provider Notes (Signed)
[facility] EMERGENCY DEPARTMENT Provider Note   CSN: [id number] Arrival date & time: 09/26/17  1345     History   Chief Complaint Chief Complaint  Patient presents with  . Shortness of Breath    HPI OCIEL RETHERFORD is a 76 y.o. male.  HPI  Pt was seen at 1355.  Per pt, c/o gradual onset and worsening of persistent cough, wheezing and SOB for the past 1 week.  Describes his symptoms as "my lungs" and "I think I have pneumonia."  Has been using home O2 N/C and MDI without relief. Describes the cough as productive of "white" sputum. Denies CP/palpitations, no back pain, no abd pain, no N/V/D, no fevers, no rash.    Past Medical History:  Diagnosis Date  . Arthritis   . Chronic rhinitis   . COPD (chronic obstructive pulmonary disease) (HCC)    PFT 06-26-09 FEV1  1.75( 71%), FVC 3.65( 101%), FEV1% 48, TLC 5.53(104%), DLCO 55%, no BD  . Hypertension   . Nasal septal deviation   . Nocturia   . On home O2   . OSA (obstructive sleep apnea) 04/21/2011  . Skin cancer     Patient Active Problem List   Diagnosis Date Noted  . Influenza A 01/24/2016  . COPD with exacerbation ([place]) 01/22/2016  . Acute on chronic respiratory failure with hypoxia ([place]) 01/22/2016  . SIRS (systemic inflammatory response syndrome) ([place]) 01/22/2016  . Leukocytosis 01/22/2016  . Hyperglycemia 01/22/2016  . Respiratory failure ([place]) 01/22/2016  . CAP (community acquired pneumonia)   . Acute respiratory failure with hypoxia ([place]) 10/03/2015  . COPD exacerbation ([place]) 02/14/2015  . Community acquired pneumonia 08/26/2014  . Shingles rash 08/26/2014  . Hypoxia 08/26/2014  . OSA (obstructive sleep apnea) 04/21/2011  . HOARSENESS 10/03/2009  . RHINITIS 08/02/2009  . COPD with emphysema ([place]) 07/05/2009  . Essential hypertension, benign 06/07/2009    Past Surgical History:  Procedure Laterality Date  . APPENDECTOMY    . BACK SURGERY    . CATARACT EXTRACTION W/PHACO  01/06/2013   Procedure: CATARACT  EXTRACTION PHACO AND INTRAOCULAR LENS PLACEMENT (IOC);  Surgeon: Tonny Branch, MD;  Location: [facility];  Service: Ophthalmology;  Laterality: Right;  CDE: 22.17       Home Medications    Prior to Admission medications   Medication Sig Start Date End Date Taking? Authorizing Provider  albuterol (PROVENTIL HFA;VENTOLIN HFA) 108 (90 BASE) MCG/ACT inhaler Inhale 1-2 puffs into the lungs every 6 (six) hours as needed for wheezing or shortness of breath. 05/29/15   Chesley Mires, MD  albuterol (PROVENTIL) (2.5 MG/3ML) 0.083% nebulizer solution Take 3 mLs (2.5 mg total) by nebulization every 4 (four) hours. And as needed for shortness of breath 11/19/16   [name], MD  amLODipine (NORVASC) 10 MG tablet Take 1 tablet (10 mg total) by mouth daily. Patient not taking: Reported on 08/20/2017 07/15/16   [name], MD  cyanocobalamin 2000 MCG tablet Take 2,000 mcg by mouth daily.    [provider]  doxazosin (CARDURA) 4 MG tablet Take 1 tablet (4 mg total) by mouth daily. Patient not taking: Reported on 08/20/2017 11/03/16   [name], MD  hydrALAZINE (APRESOLINE) 50 MG tablet Take 1 tablet (50 mg total) by mouth 2 (two) times daily with a meal. 01/23/17   [name], MD  hydrochlorothiazide (HYDRODIURIL) 25 MG tablet Take 1 tablet (25 mg total) by mouth daily. Patient not taking: Reported on 08/20/2017 08/15/16  [name], MD  levofloxacin (LEVAQUIN) 500 MG tablet Take 1 tablet (500 mg total) by mouth daily. 08/20/17   [name], MD  losartan (COZAAR) 50 MG tablet Take 1 tablet (50 mg total) by mouth daily. Patient not taking: Reported on 08/20/2017 12/31/15   [name], MD  Multiple Vitamin (MULTIVITAMIN) tablet Take 1 tablet by mouth daily.    [provider]  OXYGEN Inhale 2 L into the lungs continuous.    [provider]  predniSONE (DELTASONE) 20 MG tablet Take 3 tablets (60 mg total) by mouth daily with breakfast. 08/20/17    [name], MD  predniSONE (DELTASONE) 20 MG tablet Take 2 pills a day for 5 days the 1 pill a day for 5 days then stop 08/24/17   [name], MD  pyridoxine (B-6) 100 MG tablet Take 100 mg by mouth daily.    [provider]  SYMBICORT 160-4.5 MCG/ACT inhaler INHALE 2 PUFFS INTO THE LUNGS 2 (TWO) TIMES DAILY. 06/04/17   [name], MD    Family History Family History  Problem Relation Age of Onset  . Lung cancer Mother     Social History Social History  Substance Use Topics  . Smoking status: Former Smoker    Packs/day: 1.50    Years: 54.00    Types: Cigarettes    Quit date: 12/01/2001  . Smokeless tobacco: Never Used  . Alcohol use No     Allergies   Amlodipine; Aspirin; Other; Penicillins; and Sulfa antibiotics   Review of Systems Review of Systems ROS: Statement: All systems negative except as marked or noted in the HPI; Constitutional: Negative for fever and chills. ; ; Eyes: Negative for eye pain, redness and discharge. ; ; ENMT: Negative for ear pain, hoarseness, nasal congestion, sinus pressure and sore throat. ; ; Cardiovascular: Negative for chest pain, palpitations, diaphoresis, and peripheral edema. ; ; Respiratory: +cough, wheezing, SOB. Negative for stridor. ; ; Gastrointestinal: Negative for nausea, vomiting, diarrhea, abdominal pain, blood in stool, hematemesis, jaundice and rectal bleeding. . ; ; Genitourinary: Negative for dysuria, flank pain and hematuria. ; ; Musculoskeletal: Negative for back pain and neck pain. Negative for swelling and trauma.; ; Skin: Negative for pruritus, rash, abrasions, blisters, bruising and skin lesion.; ; Neuro: Negative for headache, lightheadedness and neck stiffness. Negative for weakness, altered level of consciousness, altered mental status, extremity weakness, paresthesias, involuntary movement, seizure and syncope.       Physical Exam Updated Vital Signs BP (!) 156/98   Pulse (!) 126   Temp 98.4 F  (36.9 C) (Oral)   Resp (!) 24   Ht 5\' 5"  (1.651 m)   Wt 72.6 kg (160 lb)   SpO2 93%   BMI 26.63 kg/m   Physical Exam 1400: Physical examination:  Nursing notes reviewed; Vital signs and O2 SAT reviewed;  Constitutional: Well developed, Well nourished, Well hydrated, Uncomfortable appearing.;; Head:  Normocephalic, atraumatic; Eyes: EOMI, PERRL, No scleral icterus; ENMT: Mouth and pharynx normal, Mucous membranes moist; Neck: Supple, Full range of motion, No lymphadenopathy; Cardiovascular: Tachycardic rate and rhythm, No gallop; Respiratory: Breath sounds diminished & equal bilaterally, insp/exp wheezes bilat. Faint audible wheezing. Moist cough during exam, spitting white sputum into emesis bag. Speaking short sentences, sitting upright, tachypneic.; Chest: Nontender, Movement normal; Abdomen: Soft, Nontender, Nondistended, Normal bowel sounds; Genitourinary: No CVA tenderness; Extremities: Pulses normal, No tenderness, No edema, No calf edema or asymmetry.; Neuro: AA&Ox3, Major CN grossly intact.  Speech  clear. No gross focal motor or sensory deficits in extremities.; Skin: Color normal, Warm, Dry.    ED Treatments / Results  Labs (all labs ordered are listed, but only abnormal results are displayed)   EKG  EKG Interpretation  Date/Time:  Saturday September 26 2017 13:55:57 EDT Ventricular Rate:  122 PR Interval:    QRS Duration: 84 QT Interval:  299 QTC Calculation: 426 R Axis:   -69 Text Interpretation:  Sinus tachycardia Ventricular premature complex Left axis deviation Left anterior fascicular block Abnormal R-wave progression, late transition Baseline wander Artifact When compared with ECG of 01/22/2016 No significant change was found Confirmed by Francine Graven [id number]) on 09/26/2017 2:03:43 PM       Radiology   Procedures Procedures (including critical care time)  Medications Ordered in ED Medications  ipratropium-albuterol (DUONEB) 0.5-2.5 (3) MG/3ML nebulizer  solution 3 mL (not administered)  albuterol (PROVENTIL) (2.5 MG/3ML) 0.083% nebulizer solution 2.5 mg (not administered)  methylPREDNISolone sodium succinate (SOLU-MEDROL) 125 mg/2 mL injection 125 mg (not administered)     Initial Impression / Assessment and Plan / ED Course  I have reviewed the triage vital signs and the nursing notes.  Pertinent labs & imaging results that were available during my care of the patient were reviewed by me and considered in my medical decision making (see chart for details).  MDM Reviewed: previous chart, nursing note and vitals Reviewed previous: labs and ECG Interpretation: labs, ECG and x-ray Total time providing critical care: 30-74 minutes. This excludes time spent performing separately reportable procedures and services. Consults: admitting MD   CRITICAL CARE Performed by: Alfonzo Feller Total critical care time: 35 minutes Critical care time was exclusive of separately billable procedures and treating other patients. Critical care was necessary to treat or prevent imminent or life-threatening deterioration. Critical care was time spent personally by me on the following activities: development of treatment plan with patient and/or surrogate as well as nursing, discussions with consultants, evaluation of patient's response to treatment, examination of patient, obtaining history from patient or surrogate, ordering and performing treatments and interventions, ordering and review of laboratory studies, ordering and review of radiographic studies, pulse oximetry and re-evaluation of patient's condition.   Results for orders placed or performed during the hospital encounter of 25/05/39  Basic metabolic panel  Result Value Ref Range   Sodium 140 135 - 145 mmol/L   Potassium 4.2 3.5 - 5.1 mmol/L   Chloride 101 101 - 111 mmol/L   CO2 27 22 - 32 mmol/L   Glucose, Bld 155 (H) 65 - 99 mg/dL   BUN 15 6 - 20 mg/dL   Creatinine, Ser 1.05 0.61 - 1.24  mg/dL   Calcium 9.7 8.9 - 10.3 mg/dL   GFR calc non Af Amer >60 >60 mL/min   GFR calc Af Amer >60 >60 mL/min   Anion gap 12 5 - 15  Troponin I  Result Value Ref Range   Troponin I <0.03 <0.03 ng/mL  CBC with Differential  Result Value Ref Range   WBC 18.2 (H) 4.0 - 10.5 K/uL   RBC 4.97 4.22 - 5.81 MIL/uL   Hemoglobin 15.1 13.0 - 17.0 g/dL   HCT 44.7 39.0 - 52.0 %   MCV 89.9 78.0 - 100.0 fL   MCH 30.4 26.0 - 34.0 pg   MCHC 33.8 30.0 - 36.0 g/dL   RDW 13.7 11.5 - 15.5 %   Platelets 293 150 - 400 K/uL   Neutrophils Relative % 83 %  Neutro Abs 15.0 (H) 1.7 - 7.7 K/uL   Lymphocytes Relative 8 %   Lymphs Abs 1.4 0.7 - 4.0 K/uL   Monocytes Relative 9 %   Monocytes Absolute 1.7 (H) 0.1 - 1.0 K/uL   Eosinophils Relative 0 %   Eosinophils Absolute 0.0 0.0 - 0.7 K/uL   Basophils Relative 0 %   Basophils Absolute 0.0 0.0 - 0.1 K/uL  Lactic acid, plasma  Result Value Ref Range   Lactic Acid, Venous 2.7 (HH) 0.5 - 1.9 mmol/L   Dg Chest Port 1 View Result Date: 09/26/2017 CLINICAL DATA:  Worsened SOB, on oxygen all the time, Coughing up phlegm EXAM: PORTABLE CHEST 1 VIEW COMPARISON:  Chest x-rays dated 12/03/2016 and 01/21/2016. Chest CT dated 01/26/2017. FINDINGS: Heart size and mediastinal contours are within normal limits. Atherosclerotic changes noted at the aortic arch. Chronic bronchitic changes again noted centrally. No new lung findings. No pleural effusion or pneumothorax seen. Emphysematous changes better demonstrated on earlier chest CT of 01/26/2017. No acute or suspicious osseous finding. IMPRESSION: 1. No active disease.  No evidence of pneumonia or pulmonary edema. 2. Emphysema and chronic bronchitic changes. 3. Aortic atherosclerosis. Electronically Signed   By: Franki Cabot M.D.   On: 09/26/2017 14:38    1520:  Pt tachycardic and tachypneic on arrival: short neb x2 and IV solumedrol given. CXR without pneumonia or CHF. WBC count and lactic acid elevated; IVF bolus given.  Resting Sat improved to 93% on O2 2L N/C, but will desat when talking at rest. Pt attempted to stand at side of bed and pt's HR increased to 130's, Sats dropped to 87% on O2 2L N/C, and coughing increased. 3rd short neb ordered. IV levaquin ordered. Pt remains afebrile. Dx and testing d/w pt and family.  Questions answered.  Verb understanding, agreeable to admit.  T/C to [facility] Dr. Nehemiah Settle, case discussed, including:  HPI, pertinent PM/SHx, VS/PE, dx testing, ED course and treatment:  Agreeable to admit.     Final Clinical Impressions(s) / ED Diagnoses   Final diagnoses:  None    New Prescriptions New Prescriptions   No medications on file     Francine Graven, DO 09/29/17 1548

## 2017-09-26 NOTE — ED Notes (Signed)
CRITICAL VALUE ALERT  Critical Value:  Lactic Acid 2.7  Date & Time Notied:  09/26/2017 @ 1437  Provider Notified: Dr. Thurnell Garbe  Orders Received/Actions taken: EDP made aware

## 2017-09-26 NOTE — ED Notes (Addendum)
This NT attempted to ambulate pt, as soon as pt stood at the side of the bed he became tachy with a heart rate of 132, pts o2 dropped to 87, and coughing worsened. Dr. Thurnell Garbe made aware

## 2017-09-26 NOTE — Progress Notes (Signed)
Lab called critical lactic acid of 7.6. Value has increased from previous of 4.5. Midlevel provider on call made aware via text page.

## 2017-09-26 NOTE — Progress Notes (Signed)
Patient has arrived on [facility] and Edrick Oh has assumed care of this patient.  Nurse from ED called while patient was in route to this floor to give me report of LA level of 4.5.  MD was paged and patient will be sent to step down unit from here.

## 2017-09-26 NOTE — Progress Notes (Signed)
Levaquin is showing on work list and in overdue meds.  I called the ER and talked with Leigh, RN, whom I received report from and this medication was given in the ER and is documented in the [facility].  Not sure why it is showing overdue med.  However, it was verbally verified that this medication was given in the ER and was not needed to be given on the floor.

## 2017-09-26 NOTE — Progress Notes (Signed)
Lactic acid trending up - Pt already received 2L in bolus and has 155mL/hr maintenance fluid. Clinically, patient improved - heart rate stabilized, BP normal, O2 sat normal. Lactic acidosis likely due to the multiple doses of albuterol in ED (pt received 6 nebs in ED: 3 albuterol and 3 duonebs). Will continue to trend for now.  Truett Mainland, DO 09/26/2017 9:55 PM

## 2017-09-26 NOTE — H&P (Signed)
History and Physical  Michael Evans QAS:[id number] DOB: June 27, 1941 DOA: 09/26/2017  Referring physician: Dr Thurnell Garbe, ED physician PCP: [name], MD  Outpatient Specialists:   Patient Coming From: Home  Chief Complaint: Shortness of breath, cough  HPI: Michael Evans is a 76 y.o. male with a history of COPD, sleep apnea, chronic respiratory failure on home O2, hypertension.  Patient seen for cough and shortness of breath times 2 weeks that is worse with exertion and improved with rest.  He does have a productive cough that started initially as a dry cough then progressed to.  In sputum over the past few days.  He has a difficult time coughing up sputum.  In September, the patient had a COPD exacerbation and was on Levaquin and prednisone.  No other palliating or provoking factors.  States he had a fever last night, although has had no elevated temperatures here.  Emergency Department Course: Patient received several treatments here.  He received 125 mg Solu-Medrol IV and was started on Levaquin 750 mg every 24 hours.  He had an elevated lactic acid and was bolused with a liter of  Review of Systems:   Pt denies any fevers, chills, nausea, vomiting, diarrhea, constipation, abdominal pain, orthopnea, palpitations, headache, vision changes, lightheadedness, dizziness, melena, rectal bleeding.  Review of systems are otherwise negative  Past Medical History:  Diagnosis Date  . Arthritis   . Chronic rhinitis   . COPD (chronic obstructive pulmonary disease) (HCC)    PFT 06-26-09 FEV1  1.75( 71%), FVC 3.65( 101%), FEV1% 48, TLC 5.53(104%), DLCO 55%, no BD  . Hypertension   . Nasal septal deviation   . Nocturia   . On home O2    2L N/C  . OSA (obstructive sleep apnea) 04/21/2011  . Skin cancer    Past Surgical History:  Procedure Laterality Date  . APPENDECTOMY    . BACK SURGERY    . CATARACT EXTRACTION W/PHACO  01/06/2013   Procedure: CATARACT EXTRACTION PHACO AND INTRAOCULAR  LENS PLACEMENT (IOC);  Surgeon: Tonny Branch, MD;  Location: [facility];  Service: Ophthalmology;  Laterality: Right;  CDE: 22.17   Social History:  reports that he quit smoking about 15 years ago. His smoking use included Cigarettes. He has a 81.00 pack-year smoking history. He has never used smokeless tobacco. He reports that he does not drink alcohol or use drugs. Patient lives at home  Allergies  Allergen Reactions  . Amlodipine Swelling    Swelling in feet  . Aspirin Other (See Comments)    Upset stomach.  . Other Hypertension    Hazelnuts  . Penicillins Hives    Has patient had a PCN reaction causing immediate rash, facial/tongue/throat swelling, SOB or lightheadedness with hypotension: Yes Has patient had a PCN reaction causing severe rash involving mucus membranes or skin necrosis: No Has patient had a PCN reaction that required hospitalization No Has patient had a PCN reaction occurring within the last 10 years: No If all of the above answers are "NO", then may proceed with Cephalosporin use.   . Sulfa Antibiotics Hives    Family History  Problem Relation Age of Onset  . Lung cancer Mother       Prior to Admission medications   Medication Sig Start Date End Date Taking? Authorizing Provider  albuterol (PROVENTIL HFA;VENTOLIN HFA) 108 (90 BASE) MCG/ACT inhaler Inhale 1-2 puffs into the lungs every 6 (six) hours as needed for wheezing or shortness of breath. 05/29/15  Chesley Mires, MD  albuterol (PROVENTIL) (2.5 MG/3ML) 0.083% nebulizer solution Take 3 mLs (2.5 mg total) by nebulization every 4 (four) hours. And as needed for shortness of breath 11/19/16   [name], MD  amLODipine (NORVASC) 10 MG tablet Take 1 tablet (10 mg total) by mouth daily. Patient not taking: Reported on 08/20/2017 07/15/16   [name], MD  cyanocobalamin 2000 MCG tablet Take 2,000 mcg by mouth daily.    [provider]  doxazosin (CARDURA) 4 MG tablet Take 1 tablet (4 mg total) by  mouth daily. Patient not taking: Reported on 08/20/2017 11/03/16   [name], MD  hydrALAZINE (APRESOLINE) 50 MG tablet Take 1 tablet (50 mg total) by mouth 2 (two) times daily with a meal. 01/23/17   [name], MD  hydrochlorothiazide (HYDRODIURIL) 25 MG tablet Take 1 tablet (25 mg total) by mouth daily. Patient not taking: Reported on 08/20/2017 08/15/16   [name], MD  levofloxacin (LEVAQUIN) 500 MG tablet Take 1 tablet (500 mg total) by mouth daily. 08/20/17   [name], MD  losartan (COZAAR) 50 MG tablet Take 1 tablet (50 mg total) by mouth daily. Patient not taking: Reported on 08/20/2017 12/31/15   [name], MD  Multiple Vitamin (MULTIVITAMIN) tablet Take 1 tablet by mouth daily.    [provider]  OXYGEN Inhale 2 L into the lungs continuous.    [provider]  predniSONE (DELTASONE) 20 MG tablet Take 3 tablets (60 mg total) by mouth daily with breakfast. 08/20/17   [name], MD  predniSONE (DELTASONE) 20 MG tablet Take 2 pills a day for 5 days the 1 pill a day for 5 days then stop 08/24/17   [name], MD  pyridoxine (B-6) 100 MG tablet Take 100 mg by mouth daily.    [provider]  SYMBICORT 160-4.5 MCG/ACT inhaler INHALE 2 PUFFS INTO THE LUNGS 2 (TWO) TIMES DAILY. 06/04/17   [name], MD    Physical Exam: BP (!) 115/95   Pulse (!) 124   Temp 98.4 F (36.9 C) (Oral)   Resp (!) 23   Ht 5\' 5"  (1.651 m)   Wt 72.6 kg (160 lb)   SpO2 96%   BMI 26.63 kg/m   General: Elderly Caucasian male. Awake and alert and oriented x3. No acute cardiopulmonary distress.  HEENT: Normocephalic atraumatic.  Right and left ears normal in appearance.  Pupils equal, round, reactive to light. Extraocular muscles are intact. Sclerae anicteric and noninjected.  Moist mucosal membranes. No mucosal lesions.  Neck: Neck supple without lymphadenopathy. No carotid bruits. No masses palpated.  Cardiovascular: Regular rate with  normal S1-S2 sounds. No murmurs, rubs, gallops auscultated. No JVD.  Respiratory: Poor respiratory effort.  Wheezing throughout.  No rales or rhonchi. No accessory muscle use. Abdomen: Soft, nontender, nondistended. Active bowel sounds. No masses or hepatosplenomegaly  Skin: No rashes, lesions, or ulcerations.  Dry, warm to touch. 2+ dorsalis pedis and radial pulses. Musculoskeletal: No calf or leg pain. All major joints not erythematous nontender.  No upper or lower joint deformation.  Good ROM.  No contractures  Psychiatric: Intact judgment and insight. Pleasant and cooperative. Neurologic: No focal neurological deficits. Strength is 5/5 and symmetric in upper and lower extremities.  Cranial nerves II through XII are grossly intact.           Labs on Admission: I have personally reviewed following labs and imaging studies  CBC:  Recent Labs Lab 09/26/17 1357  WBC 18.2*  NEUTROABS 15.0*  HGB 15.1  HCT 44.7  MCV 89.9  PLT 416   Basic Metabolic Panel:  Recent Labs Lab 09/26/17 1357  NA 140  K 4.2  CL 101  CO2 27  GLUCOSE 155*  BUN 15  CREATININE 1.05  CALCIUM 9.7   GFR: Estimated Creatinine Clearance: 50.4 mL/min (by C-G formula based on SCr of 1.05 mg/dL). Liver Function Tests: No results for input(s): AST, ALT, ALKPHOS, BILITOT, PROT, ALBUMIN in the last 168 hours. No results for input(s): LIPASE, AMYLASE in the last 168 hours. No results for input(s): AMMONIA in the last 168 hours. Coagulation Profile: No results for input(s): INR, PROTIME in the last 168 hours. Cardiac Enzymes:  Recent Labs Lab 09/26/17 1357  TROPONINI <0.03   BNP (last 3 results) No results for input(s): PROBNP in the last 8760 hours. HbA1C: No results for input(s): HGBA1C in the last 72 hours. CBG: No results for input(s): GLUCAP in the last 168 hours. Lipid Profile: No results for input(s): CHOL, HDL, LDLCALC, TRIG, CHOLHDL, LDLDIRECT in the last 72 hours. Thyroid Function  Tests: No results for input(s): TSH, T4TOTAL, FREET4, T3FREE, THYROIDAB in the last 72 hours. Anemia Panel: No results for input(s): VITAMINB12, FOLATE, FERRITIN, TIBC, IRON, RETICCTPCT in the last 72 hours. Urine analysis:    Component Value Date/Time   COLORURINE YELLOW 01/22/2016 [place] 01/22/2016 0613   LABSPEC 1.025 01/22/2016 0613   PHURINE 5.0 01/22/2016 0613   GLUCOSEU >1000 (A) 01/22/2016 0613   HGBUR NEGATIVE 01/22/2016 0613   BILIRUBINUR NEGATIVE 01/22/2016 0613   KETONESUR NEGATIVE 01/22/2016 0613   PROTEINUR NEGATIVE 01/22/2016 0613   NITRITE NEGATIVE 01/22/2016 0613   LEUKOCYTESUR NEGATIVE 01/22/2016 0613   Sepsis Labs: @LABRCNTIP (procalcitonin:4,lacticidven:4) )No results found for this or any previous visit (from the past 240 hour(s)).   Radiological Exams on Admission: Dg Chest Port 1 View  Result Date: 09/26/2017 CLINICAL DATA:  Worsened SOB, on oxygen all the time, Coughing up phlegm EXAM: PORTABLE CHEST 1 VIEW COMPARISON:  Chest x-rays dated 12/03/2016 and 01/21/2016. Chest CT dated 01/26/2017. FINDINGS: Heart size and mediastinal contours are within normal limits. Atherosclerotic changes noted at the aortic arch. Chronic bronchitic changes again noted centrally. No new lung findings. No pleural effusion or pneumothorax seen. Emphysematous changes better demonstrated on earlier chest CT of 01/26/2017. No acute or suspicious osseous finding. IMPRESSION: 1. No active disease.  No evidence of pneumonia or pulmonary edema. 2. Emphysema and chronic bronchitic changes. 3. Aortic atherosclerosis. Electronically Signed   By: Franki Cabot M.D.   On: 09/26/2017 14:38    EKG: Independently reviewed.  Sinus tachycardia.  Left anterior fascicular block.  Late R wave progression.  No acute ST changes.  Assessment/Plan: Principal Problem:   Acute on chronic respiratory failure with hypoxia (HCC) Active Problems:   Essential hypertension, benign   COPD  exacerbation (HCC)   SIRS (systemic inflammatory response syndrome) (HCC)   Hyperglycemia   Tachycardia    This patient was discussed with the ED physician, including pertinent vitals, physical exam findings, labs, and imaging.  We also discussed care given by the ED provider.  1. acute on chronic respiratory failure with hypoxia  Admit on telemetry  Supportive therapy and oxygen 2.  Sirs  Elevated white count, tachycardia  No acute evidence of infection 3.  COPD exacerbation  Antibiotics: Levaquin  DuoNeb's every 6 scheduled with albuterol every 2 when necessary  Continue inhaled  steroids and LA bronchodilator  Solu-Medrol 60 mg IV every 6 hours  Mucinex 4.  Hyperglycemia  Check hemoglobin A1c  CBGs before meals and nightly  Sliding scale insulin 5.  Tachycardia  1 L IV fluids given in ED  Will place on maintenance therapy for 10 hours (100 mL's per hour) 6.  Essential hypertension  Continue antihypertensives  DVT prophylaxis: Continue antihypertensives Consultants: None Code Status: Full code Family Communication: None Disposition Plan: Patient should be able to return home following hospital stay   Truett Mainland, DO [facility] Pager [phone]  If 7PM-7AM, please contact night-coverage [email or web address] Password [email or web address]

## 2017-09-27 LAB — BASIC METABOLIC PANEL
ANION GAP: 9 (ref 5–15)
BUN: 15 mg/dL (ref 6–20)
CO2: 23 mmol/L (ref 22–32)
Calcium: 8.3 mg/dL — ABNORMAL LOW (ref 8.9–10.3)
Chloride: 107 mmol/L (ref 101–111)
Creatinine, Ser: 0.8 mg/dL (ref 0.61–1.24)
GFR calc Af Amer: >60 mL/min (ref >60)
GLUCOSE: 278 mg/dL — AB (ref 65–99)
POTASSIUM: 3.4 mmol/L — AB (ref 3.5–5.1)
Sodium: 139 mmol/L (ref 135–145)

## 2017-09-27 LAB — CBC
HEMATOCRIT: 36.8 % — AB (ref 39.0–52.0)
HEMOGLOBIN: 12.3 g/dL — AB (ref 13.0–17.0)
MCH: 29.9 pg (ref 26.0–34.0)
MCHC: 33.4 g/dL (ref 30.0–36.0)
MCV: 89.3 fL (ref 78.0–100.0)
PLATELETS: 258 10*3/uL (ref 150–400)
RBC: 4.12 MIL/uL — AB (ref 4.22–5.81)
RDW: 13.7 % (ref 11.5–15.5)
WBC: 15.9 10*3/uL — ABNORMAL HIGH (ref 4.0–10.5)

## 2017-09-27 LAB — RESPIRATORY PANEL BY PCR
ADENOVIRUS-RVPPCR: NOT DETECTED
Bordetella pertussis: NOT DETECTED
CORONAVIRUS 229E-RVPPCR: NOT DETECTED
CORONAVIRUS HKU1-RVPPCR: NOT DETECTED
CORONAVIRUS NL63-RVPPCR: NOT DETECTED
CORONAVIRUS OC43-RVPPCR: NOT DETECTED
Chlamydophila pneumoniae: NOT DETECTED
INFLUENZA A-RVPPCR: NOT DETECTED
Influenza B: NOT DETECTED
MYCOPLASMA PNEUMONIAE-RVPPCR: NOT DETECTED
Metapneumovirus: NOT DETECTED
PARAINFLUENZA VIRUS 1-RVPPCR: NOT DETECTED
PARAINFLUENZA VIRUS 4-RVPPCR: NOT DETECTED
Parainfluenza Virus 2: NOT DETECTED
Parainfluenza Virus 3: NOT DETECTED
Respiratory Syncytial Virus: NOT DETECTED
Rhinovirus / Enterovirus: NOT DETECTED

## 2017-09-27 LAB — GLUCOSE, CAPILLARY
GLUCOSE-CAPILLARY: 199 mg/dL — AB (ref 65–99)
GLUCOSE-CAPILLARY: 253 mg/dL — AB (ref 65–99)
Glucose-Capillary: 199 mg/dL — ABNORMAL HIGH (ref 65–99)
Glucose-Capillary: 173 mg/dL — ABNORMAL HIGH (ref 65–99)

## 2017-09-27 LAB — LACTIC ACID, PLASMA: LACTIC ACID, VENOUS: 2.4 mmol/L — AB (ref 0.5–1.9)

## 2017-09-27 MED ORDER — IPRATROPIUM-ALBUTEROL 0.5-2.5 (3) MG/3ML IN SOLN
3.0000 mL | RESPIRATORY_TRACT | Status: DC
  Administered 2017-09-27 – 2017-09-28 (×6): 3 mL via RESPIRATORY_TRACT
  Filled 2017-09-27 (×6): qty 3

## 2017-09-27 MED ORDER — METHYLPREDNISOLONE SODIUM SUCC 40 MG IJ SOLR
40.0000 mg | Freq: Three times a day (TID) | INTRAMUSCULAR | Status: DC
  Administered 2017-09-27 – 2017-09-29 (×6): 40 mg via INTRAVENOUS
  Filled 2017-09-27 (×6): qty 1

## 2017-09-27 MED ORDER — AZITHROMYCIN 250 MG PO TABS
500.0000 mg | ORAL_TABLET | Freq: Every day | ORAL | Status: DC
  Administered 2017-09-27 – 2017-10-02 (×6): 500 mg via ORAL
  Filled 2017-09-27 (×6): qty 2

## 2017-09-27 MED ORDER — DM-GUAIFENESIN ER 30-600 MG PO TB12
1.0000 | ORAL_TABLET | Freq: Once | ORAL | Status: AC | PRN
  Administered 2017-09-27: 1 via ORAL
  Filled 2017-09-27: qty 1

## 2017-09-27 MED ORDER — IPRATROPIUM-ALBUTEROL 0.5-2.5 (3) MG/3ML IN SOLN: 3.0000 mL | RESPIRATORY_TRACT | Status: DC | PRN

## 2017-09-27 MED ORDER — GUAIFENESIN-DM 100-10 MG/5ML PO SYRP
5.0000 mL | ORAL_SOLUTION | ORAL | Status: DC | PRN
  Administered 2017-09-28: 5 mL via ORAL
  Filled 2017-09-27: qty 5

## 2017-09-27 NOTE — Progress Notes (Signed)
PROGRESS NOTE    Michael Evans  QZE:[id number] DOB: 1941/01/10 DOA: 09/26/2017 PCP: [name], MD    Brief Narrative:  76 year old male who presented with dyspnea. Patient does have a significant past medical history COPD with chronic hypoxic respiratory failure, sleep apnea and hypertension. Patient presents with worsening dyspnea, associated with the persistent productive cough. On the initial physical examination, blood pressure 115/95, heart rate 124, temperature 98.4, respiratory rate 23, oxygen saturation 96% on supplemental oxygen. Moist mucous membranes, heart S1-S2 present, and rhythmic, tachycardic, no gallops, lungs with diffuse bilateral wheezing, poor ventilation, abdomen was soft and nondistended, nontender, lower extremities with no edema. Sodium 140, potassium 4.2, chloride 101, bicarbonate 27, glucose 155, BUN 15, creatinine 1.05, venous lactic acid 2.7, white count 18.2, hemoglobin 15.1, hematocrit 44.7, platelets 293, chest x-ray with poor inspiration, left rotation, positive air trapping, no effusions, infiltrates or pneumothorax. EKG was sinus tachycardia 122 bpm, left axis deviation and poor R-wave progression.  Patient was admitted to the hospital with the working diagnosis acute on chronic hypoxic respiratory failure, due to COPD exacerbation.   Assessment & Plan:   Principal Problem:   Acute on chronic respiratory failure with hypoxia (HCC) Active Problems:   Essential hypertension, benign   COPD exacerbation (HCC)   SIRS (systemic inflammatory response syndrome) (HCC)   Hyperglycemia   Tachycardia   1. Acute on chronic hypoxic respiratory failure due to COPD exacerbation. Will continue aggressive bronchodilator therapy with duoneb q 4 hours and as needed q 2 hours, systemic steroids, and oxymetry monitoring with supplemental 02 per [place] to keep 02 saturation greater than 88%. Will change levofloxacin to azithromycin. Will plan for 5 days of antibiotic  therapy.   2. Hypertension. Will continue hydralazine for blood pressure control, systolic blood pressure 226 systolic.   3. Elevated lactic acid. Certainly not type A, more likely type B, serum bicarbonate at 23, with non anion gap. Patient hemodynamic stable.   4. History of tobacco abuse. Smoking cessation counseling.   5. Steroid induced hyperglycemia. Will continue insulin sliding scale monitoring, and glucose coverage, capillary glucose 199-153. Prompt taper off systemic steroids.  DVT prophylaxis: enoxaparin  Code Status: full Family Communication:  Disposition Plan: Home   Consultants:     Procedures:     Antimicrobials:       Subjective: Patient with persistent dyspnea and wheezing, improved from admission, associated with cough, no chest pain, no lower extremity edema, no nausea or vomiting.   Objective: Vitals:   09/27/17 0453 09/27/17 0500 09/27/17 0600 09/27/17 0747  BP:  (!) 127/91 136/71   Pulse:  85 78 73  Resp:  18 (!) 22 18  Temp:    (!) 97.3 F (36.3 C)  TempSrc:    Oral  SpO2:  94% 94% 95%  Weight: 69.5 kg (153 lb 3.5 oz)     Height:        Intake/Output Summary (Last 24 hours) at 09/27/17 0825 Last data filed at 09/27/17 0630  Gross per 24 hour  Intake              630 ml  Output             1400 ml  Net             -770 ml   Filed Weights   09/26/17 1349 09/27/17 0453  Weight: 72.6 kg (160 lb) 69.5 kg (153 lb 3.5 oz)    Examination:   General: deconditioned and in dyspnea  Neurology: Awake and alert, non focal  E ENT: mild pallor, no icterus, oral mucosa moist Cardiovascular: No JVD. S1-S2 present, rhythmic, no gallops, rubs, or murmurs. No lower extremity edema. Pulmonary: decreased breath sounds bilaterally, poor air movement, no wheezing, no rhonchi, but scattered rales. Gastrointestinal. Abdomen flat, no organomegaly, non tender, no rebound or guarding Skin. No rashes Musculoskeletal: no joint deformities     Data  Reviewed: I have personally reviewed following labs and imaging studies  CBC:  Recent Labs Lab 09/26/17 1357 09/27/17 0433  WBC 18.2* 15.9*  NEUTROABS 15.0*  --   HGB 15.1 12.3*  HCT 44.7 36.8*  MCV 89.9 89.3  PLT 293 902   Basic Metabolic Panel:  Recent Labs Lab 09/26/17 1357 09/27/17 0433  NA 140 139  K 4.2 3.4*  CL 101 107  CO2 27 23  GLUCOSE 155* 278*  BUN 15 15  CREATININE 1.05 0.80  CALCIUM 9.7 8.3*   GFR: Estimated Creatinine Clearance: 66.2 mL/min (by C-G formula based on SCr of 0.8 mg/dL). Liver Function Tests: No results for input(s): AST, ALT, ALKPHOS, BILITOT, PROT, ALBUMIN in the last 168 hours. No results for input(s): LIPASE, AMYLASE in the last 168 hours. No results for input(s): AMMONIA in the last 168 hours. Coagulation Profile: No results for input(s): INR, PROTIME in the last 168 hours. Cardiac Enzymes:  Recent Labs Lab 09/26/17 1357  TROPONINI <0.03   BNP (last 3 results) No results for input(s): PROBNP in the last 8760 hours. HbA1C: No results for input(s): HGBA1C in the last 72 hours. CBG:  Recent Labs Lab 09/26/17 2029 09/27/17 0754  GLUCAP 266* 199*   Lipid Profile: No results for input(s): CHOL, HDL, LDLCALC, TRIG, CHOLHDL, LDLDIRECT in the last 72 hours. Thyroid Function Tests: No results for input(s): TSH, T4TOTAL, FREET4, T3FREE, THYROIDAB in the last 72 hours. Anemia Panel: No results for input(s): VITAMINB12, FOLATE, FERRITIN, TIBC, IRON, RETICCTPCT in the last 72 hours.    Radiology Studies: I have reviewed all of the imaging during this hospital visit personally     Scheduled Meds: . albuterol  2.5 mg Nebulization Q6H  . enoxaparin (LOVENOX) injection  40 mg Subcutaneous Q24H  . guaiFENesin  600 mg Oral BID  . hydrALAZINE  50 mg Oral BID WC  . insulin aspart  0-15 Units Subcutaneous TID WC  . insulin aspart  0-5 Units Subcutaneous QHS  . methylPREDNISolone (SOLU-MEDROL) injection  60 mg Intravenous Q6H  .  mometasone-formoterol  2 puff Inhalation BID  . tiotropium  18 mcg Inhalation Daily   Continuous Infusions: . sodium chloride 100 mL/hr at 09/27/17 0555  . levofloxacin (LEVAQUIN) IV Stopped (09/26/17 1745)  . sodium chloride Stopped (09/26/17 2100)     LOS: 1 day        Michael Millers, MD [facility] Pager [phone]

## 2017-09-27 NOTE — Progress Notes (Signed)
Lactic acid called by lab. 2.4 Value much improved over last draw.

## 2017-09-28 DIAGNOSIS — R0902 Hypoxemia: Secondary | ICD-10-CM

## 2017-09-28 LAB — GLUCOSE, CAPILLARY
GLUCOSE-CAPILLARY: 206 mg/dL — AB (ref 65–99)
GLUCOSE-CAPILLARY: 132 mg/dL — AB (ref 65–99)
Glucose-Capillary: 262 mg/dL — ABNORMAL HIGH (ref 65–99)
Glucose-Capillary: 177 mg/dL — ABNORMAL HIGH (ref 65–99)

## 2017-09-28 LAB — CBC WITH DIFFERENTIAL/PLATELET
BASOS ABS: 0 10*3/uL (ref 0.0–0.1)
Basophils Relative: 0 %
EOS ABS: 0 10*3/uL (ref 0.0–0.7)
Eosinophils Relative: 0 %
HCT: 36.8 % — ABNORMAL LOW (ref 39.0–52.0)
HEMOGLOBIN: 12.2 g/dL — AB (ref 13.0–17.0)
LYMPHS ABS: 1.3 10*3/uL (ref 0.7–4.0)
LYMPHS PCT: 5 %
MCH: 29.6 pg (ref 26.0–34.0)
MCHC: 33.2 g/dL (ref 30.0–36.0)
MCV: 89.3 fL (ref 78.0–100.0)
Monocytes Absolute: 1.7 10*3/uL — ABNORMAL HIGH (ref 0.1–1.0)
Monocytes Relative: 7 %
NEUTROS PCT: 88 %
Neutro Abs: 21.4 10*3/uL — ABNORMAL HIGH (ref 1.7–7.7)
Platelets: 287 10*3/uL (ref 150–400)
RBC: 4.12 MIL/uL — AB (ref 4.22–5.81)
RDW: 14.2 % (ref 11.5–15.5)
WBC: 24.4 10*3/uL — ABNORMAL HIGH (ref 4.0–10.5)

## 2017-09-28 LAB — BASIC METABOLIC PANEL
Anion gap: 9 (ref 5–15)
BUN: 19 mg/dL (ref 6–20)
CHLORIDE: 109 mmol/L (ref 101–111)
CO2: 22 mmol/L (ref 22–32)
Calcium: 8.5 mg/dL — ABNORMAL LOW (ref 8.9–10.3)
Creatinine, Ser: 0.8 mg/dL (ref 0.61–1.24)
Glucose, Bld: 229 mg/dL — ABNORMAL HIGH (ref 65–99)
POTASSIUM: 3.5 mmol/L (ref 3.5–5.1)
SODIUM: 140 mmol/L (ref 135–145)

## 2017-09-28 LAB — HEMOGLOBIN A1C
Hgb A1c MFr Bld: 7.1 % — ABNORMAL HIGH (ref 4.8–5.6)
Mean Plasma Glucose: 157 mg/dL

## 2017-09-28 MED ORDER — GUAIFENESIN-DM 100-10 MG/5ML PO SYRP
10.0000 mL | ORAL_SOLUTION | Freq: Four times a day (QID) | ORAL | Status: DC
  Administered 2017-09-28 – 2017-09-30 (×9): 10 mL via ORAL
  Filled 2017-09-28 (×10): qty 10

## 2017-09-28 MED ORDER — SODIUM CHLORIDE 3 % IN NEBU
4.0000 mL | INHALATION_SOLUTION | Freq: Two times a day (BID) | RESPIRATORY_TRACT | Status: DC
  Administered 2017-09-28 – 2017-09-29 (×4): 4 mL via RESPIRATORY_TRACT
  Filled 2017-09-28 (×4): qty 4

## 2017-09-28 MED ORDER — IPRATROPIUM-ALBUTEROL 0.5-2.5 (3) MG/3ML IN SOLN
3.0000 mL | Freq: Four times a day (QID) | RESPIRATORY_TRACT | Status: DC
  Administered 2017-09-28 – 2017-09-30 (×8): 3 mL via RESPIRATORY_TRACT
  Filled 2017-09-28 (×8): qty 3

## 2017-09-28 NOTE — Progress Notes (Signed)
Inpatient Diabetes Program Recommendations  AACE/ADA: New Consensus Statement on Inpatient Glycemic Control (2015)  Target Ranges:  Prepandial:   less than 140 mg/dL      Peak postprandial:   less than 180 mg/dL (1-2 hours)      Critically ill patients:  140 - 180 mg/dL   Lab Results  Component Value Date   GLUCAP 262 (H) 09/28/2017   HGBA1C 7.1 (H) 09/27/2017    10/29  Pt denies hx of DM.  HgbA1c- 7.1 Pt on steroids for COPD.  Pt states that he watches his diet and avoids sweets at home.  Recommend adding CHO modified to current diet order.  If blood sugars remain elevated, might consider adding Lantus 10 units daily while on Solu-medrol.  [place], CDE. M.Ed. Pager [phone] Inpatient Diabetes Coordinator

## 2017-09-28 NOTE — Evaluation (Signed)
Physical Therapy Evaluation Patient Details Name: Michael Evans MRN: [id number] DOB: 1941-06-23 Today's Date: 09/28/2017   History of Present Illness  Michael Evans is a 76 y.o. male with a history of COPD, sleep apnea, chronic respiratory failure on home O2, hypertension.  Patient seen for cough and shortness of breath times 2 weeks that is worse with exertion and improved with rest.  He does have a productive cough that started initially as a dry cough then progressed to.  In sputum over the past few days.  He has a difficult time coughing up sputum.  In September, the patient had a COPD exacerbation and was on Levaquin and prednisone.  No other palliating or provoking factors.  States he had a fever last night, although has had no elevated temperatures here.     Clinical Impression  Michael Evans is a 76 y.o. male presenting for PT evaluation with history of COPD.  Mr. Eggebrecht currently requires 4L/min O2 to maintain SpO2 of 90% and above during functional mobility. He is functioning at his reported baseling of independent without a device and is able to manage his portable O2 while ambulating. Mr. Conley demonstrating appropriate balance/postural stepping strategies during high level balance testing with no overt LOB. He has been educated on energy conservation techniques such as pursed lip breathing and demonstrated competence in performing this skill. he is appropriate for DC to home at this time, once he is medically cleared. PT signing off and will hand care off to nursing staff at this time. Re-consult if change in patients functional status.     Follow Up Recommendations No PT follow up    Equipment Recommendations       Recommendations for Other Services       Precautions / Restrictions Precautions Precaution Comments: Supplemental O2 during mobility Restrictions Weight Bearing Restrictions: No      Mobility  Bed Mobility Overal bed mobility: Independent   Transfers Overall  transfer level: Independent   Ambulation/Gait Ambulation/Gait assistance: Independent Ambulation Distance (Feet): 100 Feet   Gait Pattern/deviations: WFL(Within Functional Limits)   Gait velocity interpretation: at or above normal speed for age/gender General Gait Details: patient able to manage portable O2 canister and lines independently     Balance Overall balance assessment: Independent   Sitting balance-Leahy Scale: Normal    Standing balance-Leahy Scale: Normal Standing balance comment: patient able to maintain balance/midline with perturbations with eyes closeposture Single Leg Stance - Right Leg: 5 Single Leg Stance - Left Leg: 5 Tandem Stance - Right Leg: 12 Tandem Stance - Left Leg: 5 [place] - Eyes Opened: 30 [place] - Eyes Closed: 30 High level balance activites: Head turns High Level Balance Comments: patient demonstrated safe postural reactions and stepping strategies to prevent LOB while ambulating with vertical and lateral head turns       Pertinent Vitals/Pain Pain Assessment: No/denies pain    Home Living Family/patient expects to be discharged to:: Private residence Living Arrangements: Alone Available Help at Discharge: Family Type of Home: House Home Access: Level entry     Home Layout: One level Home Equipment: None Additional Comments: patient is independent with no device at home, manages his O2 lines during all mobility, reports he is able to split wood and do yard work while managing his rest breaks    Prior Function Level of Independence: Independent    Comments: prior to admission using 2L/min o2     Hand Dominance   Dominant Hand: Right  Extremity/Trunk Assessment   Upper Extremity Assessment Upper Extremity Assessment: Overall WFL for tasks assessed    Lower Extremity Assessment Lower Extremity Assessment: Overall WFL for tasks assessed    Cervical / Trunk Assessment Cervical / Trunk Assessment: Normal  Communication    Communication: No difficulties  Cognition Arousal/Alertness: Awake/alert Behavior During Therapy: WFL for tasks assessed/performed Overall Cognitive Status: Within Functional Limits for tasks assessed            Assessment/Plan    PT Assessment Patent does not need any further PT services  PT Problem List Decreased activity tolerance;Cardiopulmonary status limiting activity;Decreased mobility       PT Treatment Interventions      PT Goals (Current goals can be found in the Care Plan section)  Acute Rehab PT Goals Patient Stated Goal: get back to yard work at home PT Goal Formulation: With patient Time For Goal Achievement: 09/30/17 Potential to Achieve Goals: Good    Frequency     Barriers to discharge           AM-PAC PT "6 Clicks" Daily Activity  Outcome Measure Difficulty turning over in bed (including adjusting bedclothes, sheets and blankets)?: None Difficulty moving from lying on back to sitting on the side of the bed? : None   Help needed moving to and from a bed to chair (including a wheelchair)?: None Help needed walking in hospital room?: None Help needed climbing 3-5 steps with a railing? : None 6 Click Score: 20    End of Session   Activity Tolerance: Patient tolerated treatment well Patient left: in chair;with call bell/phone within reach (with O2 and ICU monitoring connected) Nurse Communication: Mobility status PT Visit Diagnosis: Difficulty in walking, not elsewhere classified (R26.2);Other abnormalities of gait and mobility (R26.89) (symptoms of reduced cardiopulmonary endurance)    Time: 0762-2633 PT Time Calculation (min) (ACUTE ONLY): 30 min   Charges:   PT Evaluation $PT Eval Moderate Complexity: 1 Mod PT Treatments $Therapeutic Activity: 8-22 mins   PT G Codes:        Debara Pickett, PT, DPT Physical Therapist with [facility]  09/28/2017 4:34 PM

## 2017-09-28 NOTE — Progress Notes (Signed)
PROGRESS NOTE    Michael Evans  NID:[id number] DOB: 08/29/1941 DOA: 09/26/2017 PCP: [name], MD    Brief Narrative:  76 year old male who presented with dyspnea. Patient does have a significant past medical history COPD with chronic hypoxic respiratory failure, sleep apnea and hypertension. Patient presents with worsening dyspnea, associated with the persistent productive cough. On the initial physical examination, blood pressure 115/95, heart rate 124, temperature 98.4, respiratory rate 23, oxygen saturation 96% on supplemental oxygen. Moist mucous membranes, heart S1-S2 present, and rhythmic, tachycardic, no gallops, lungs with diffuse bilateral wheezing, poor ventilation, abdomen was soft and nondistended, nontender, lower extremities with no edema. Sodium 140, potassium 4.2, chloride 101, bicarbonate 27, glucose 155, BUN 15, creatinine 1.05, venous lactic acid 2.7, white count 18.2, hemoglobin 15.1, hematocrit 44.7, platelets 293, chest x-ray with poor inspiration, left rotation, positive air trapping, no effusions, infiltrates or pneumothorax. EKG was sinus tachycardia 122 bpm, left axis deviation and poor R-wave progression.  Patient was admitted to the hospital with the working diagnosis acute on chronic hypoxic respiratory failure, due to COPD exacerbation.   Assessment & Plan:   Principal Problem:   Acute on chronic respiratory failure with hypoxia (HCC) Active Problems:   Essential hypertension, benign   COPD exacerbation (HCC)   SIRS (systemic inflammatory response syndrome) (HCC)   Hyperglycemia   Tachycardia   1. Acute on chronic hypoxic respiratory failure due to COPD exacerbation. On aggressive bronchodilator, duoneb q 4 hours and as needed q 2 hours, continue systemic steroids. Will keep 24 more hours of continuous oxymetry monitoring, traget 02 saturation greater than 88%. Antibiotic therapy with Azithromycin. Will add hypertonic saline nebs, chest PT, flutter  valve and guaifenesin Dm, scheduled. Out of bed as tolerated and physical therapy evaluation.   2. Hypertension. On hydralazine for blood pressure control, with good toleration.   3. Elevated lactic acid. Has resolved.   4. History of tobacco abuse.  Continue smoking cessation counseling.   5. Steroid induced hyperglycemia. Capillary glucose 253, 199, 173, 206, 262,  continue insulin sliding scale monitoring, and glucose coverage. Continue current dose of systemic steroids, will assess in 24 hours need for basal insulin.   DVT prophylaxis: enoxaparin  Code Status: full Family Communication:  Disposition Plan: Home   Consultants:     Procedures:     Antimicrobials:       Subjective: Patient complains of worsening dyspnea this am, associated with cough. Persistent and severe in intensity, positive wheezing. No nausea or vomiting.   Objective: Vitals:   09/28/17 0455 09/28/17 0740 09/28/17 0748 09/28/17 0810  BP:  118/68    Pulse:      Resp:      Temp:    97.6 F (36.4 C)  TempSrc:    Oral  SpO2:   97%   Weight: 70.1 kg (154 lb 8.7 oz)     Height:        Intake/Output Summary (Last 24 hours) at 09/28/17 0820 Last data filed at 09/28/17 0743  Gross per 24 hour  Intake             1020 ml  Output             1295 ml  Net             -275 ml   Filed Weights   09/26/17 1349 09/27/17 0453 09/28/17 0455  Weight: 72.6 kg (160 lb) 69.5 kg (153 lb 3.5 oz) 70.1 kg (154 lb 8.7 oz)  Examination:   General: positive dyspnea and deconditioning Neurology: Awake and alert, non focal  E ENT: mild pallor, no icterus, oral mucosa moist Cardiovascular: No JVD. S1-S2 present, rhythmic, no gallops, rubs, or murmurs. No lower extremity edema. Pulmonary: decreased breath sounds bilaterally, poor air movement, positive expiratory wheezing, with scattered rhonchi and rales. Gastrointestinal. Abdomen flat, no organomegaly, non tender, no rebound or guarding Skin. No  rashes Musculoskeletal: no joint deformities     Data Reviewed: I have personally reviewed following labs and imaging studies  CBC:  Recent Labs Lab 09/26/17 1357 09/27/17 0433 09/28/17 0407  WBC 18.2* 15.9* 24.4*  NEUTROABS 15.0*  --  21.4*  HGB 15.1 12.3* 12.2*  HCT 44.7 36.8* 36.8*  MCV 89.9 89.3 89.3  PLT 293 258 622   Basic Metabolic Panel:  Recent Labs Lab 09/26/17 1357 09/27/17 0433 09/28/17 0407  NA 140 139 140  K 4.2 3.4* 3.5  CL 101 107 109  CO2 27 23 22   GLUCOSE 155* 278* 229*  BUN 15 15 19   CREATININE 1.05 0.80 0.80  CALCIUM 9.7 8.3* 8.5*   GFR: Estimated Creatinine Clearance: 66.2 mL/min (by C-G formula based on SCr of 0.8 mg/dL). Liver Function Tests: No results for input(s): AST, ALT, ALKPHOS, BILITOT, PROT, ALBUMIN in the last 168 hours. No results for input(s): LIPASE, AMYLASE in the last 168 hours. No results for input(s): AMMONIA in the last 168 hours. Coagulation Profile: No results for input(s): INR, PROTIME in the last 168 hours. Cardiac Enzymes:  Recent Labs Lab 09/26/17 1357  TROPONINI <0.03   BNP (last 3 results) No results for input(s): PROBNP in the last 8760 hours. HbA1C: No results for input(s): HGBA1C in the last 72 hours. CBG:  Recent Labs Lab 09/27/17 0754 09/27/17 1109 09/27/17 1603 09/27/17 2105 09/28/17 0807  GLUCAP 199* 253* 199* 173* 206*   Lipid Profile: No results for input(s): CHOL, HDL, LDLCALC, TRIG, CHOLHDL, LDLDIRECT in the last 72 hours. Thyroid Function Tests: No results for input(s): TSH, T4TOTAL, FREET4, T3FREE, THYROIDAB in the last 72 hours. Anemia Panel: No results for input(s): VITAMINB12, FOLATE, FERRITIN, TIBC, IRON, RETICCTPCT in the last 72 hours.    Radiology Studies: I have reviewed all of the imaging during this hospital visit personally     Scheduled Meds: . azithromycin  500 mg Oral Daily  . enoxaparin (LOVENOX) injection  40 mg Subcutaneous Q24H  .  guaiFENesin-dextromethorphan  10 mL Oral Q6H  . hydrALAZINE  50 mg Oral BID WC  . insulin aspart  0-15 Units Subcutaneous TID WC  . insulin aspart  0-5 Units Subcutaneous QHS  . ipratropium-albuterol  3 mL Nebulization QID  . methylPREDNISolone (SOLU-MEDROL) injection  40 mg Intravenous Q8H  . mometasone-formoterol  2 puff Inhalation BID  . sodium chloride HYPERTONIC  4 mL Nebulization BID   Continuous Infusions:   LOS: 2 days        Mauricio Gerome Apley, MD [facility] Pager [phone]

## 2017-09-29 LAB — CBC WITH DIFFERENTIAL/PLATELET
BASOS ABS: 0 10*3/uL (ref 0.0–0.1)
BASOS PCT: 0 %
Eosinophils Absolute: 0 10*3/uL (ref 0.0–0.7)
Eosinophils Relative: 0 %
HEMATOCRIT: 35.9 % — AB (ref 39.0–52.0)
HEMOGLOBIN: 12.2 g/dL — AB (ref 13.0–17.0)
Lymphocytes Relative: 6 %
Lymphs Abs: 1.3 10*3/uL (ref 0.7–4.0)
MCH: 30.2 pg (ref 26.0–34.0)
MCHC: 34 g/dL (ref 30.0–36.0)
MCV: 88.9 fL (ref 78.0–100.0)
MONOS PCT: 6 %
Monocytes Absolute: 1.3 10*3/uL — ABNORMAL HIGH (ref 0.1–1.0)
NEUTROS ABS: 18.9 10*3/uL — AB (ref 1.7–7.7)
NEUTROS PCT: 88 %
Platelets: 274 10*3/uL (ref 150–400)
RBC: 4.04 MIL/uL — AB (ref 4.22–5.81)
RDW: 14.6 % (ref 11.5–15.5)
WBC: 21.5 10*3/uL — ABNORMAL HIGH (ref 4.0–10.5)

## 2017-09-29 LAB — GLUCOSE, CAPILLARY
GLUCOSE-CAPILLARY: 160 mg/dL — AB (ref 65–99)
GLUCOSE-CAPILLARY: 179 mg/dL — AB (ref 65–99)
Glucose-Capillary: 198 mg/dL — ABNORMAL HIGH (ref 65–99)
Glucose-Capillary: 189 mg/dL — ABNORMAL HIGH (ref 65–99)

## 2017-09-29 LAB — BASIC METABOLIC PANEL
ANION GAP: 9 (ref 5–15)
BUN: 23 mg/dL — ABNORMAL HIGH (ref 6–20)
CHLORIDE: 109 mmol/L (ref 101–111)
CO2: 24 mmol/L (ref 22–32)
Calcium: 8.6 mg/dL — ABNORMAL LOW (ref 8.9–10.3)
Creatinine, Ser: 0.79 mg/dL (ref 0.61–1.24)
GFR calc non Af Amer: >60 mL/min (ref >60)
Glucose, Bld: 201 mg/dL — ABNORMAL HIGH (ref 65–99)
POTASSIUM: 3.9 mmol/L (ref 3.5–5.1)
Sodium: 142 mmol/L (ref 135–145)

## 2017-09-29 MED ORDER — SODIUM CHLORIDE 3 % IN NEBU
4.0000 mL | INHALATION_SOLUTION | Freq: Two times a day (BID) | RESPIRATORY_TRACT | Status: DC
  Administered 2017-09-30 – 2017-10-02 (×5): 4 mL via RESPIRATORY_TRACT
  Filled 2017-09-29 (×5): qty 4

## 2017-09-29 MED ORDER — METHYLPREDNISOLONE SODIUM SUCC 40 MG IJ SOLR
40.0000 mg | Freq: Two times a day (BID) | INTRAMUSCULAR | Status: DC
  Administered 2017-09-29 – 2017-09-30 (×2): 40 mg via INTRAVENOUS
  Filled 2017-09-29 (×2): qty 1

## 2017-09-29 NOTE — Plan of Care (Signed)
Problem: Safety: Goal: Ability to remain free from injury will improve Outcome: Completed/Met Date Met: 09/29/17 Patient alert & oriented x 4, able to make needs known, follows commands and demonstrates proper use of call bell when assistance is needed, call bell within reach, personal items and bedside table within reach, bed in lowest position, bed alarm in use    

## 2017-09-29 NOTE — Progress Notes (Signed)
PT Cancellation Note  Patient Details Name: Michael Evans MRN: [id number] DOB: [birth year]   Cancelled Treatment:     PT seen by PT yesterday and was discharged due to pt being at prior functional level.  Please see note dated 07/29/2017   Rayetta Humphrey, PT CLT [phone] 09/29/2017, 8:34 AM

## 2017-09-29 NOTE — Plan of Care (Signed)
Problem: Activity: Goal: Risk for activity intolerance will decrease Outcome: Progressing PT worked with patient and patient was able to tolerate walking while on supplemental oxygen. Pt still has dyspnea with exertion, but understands importance of rest periods. He states he feels like he is almost back to baseline.

## 2017-09-29 NOTE — Progress Notes (Signed)
PROGRESS NOTE    Michael Evans  GYF:[id number] DOB: 09-14-1941 DOA: 09/26/2017 PCP: [name], MD    Brief Narrative:  76 year old male who presented with dyspnea. Patient does have a significant past medical history COPD with chronic hypoxic respiratory failure, sleep apnea and hypertension. Patient presents with worsening dyspnea, associated with the persistent productive cough. On the initial physical examination, blood pressure 115/95, heart rate 124, temperature 98.4, respiratory rate 23, oxygen saturation 96% on supplemental oxygen. Moist mucous membranes, heart S1-S2 present, and rhythmic, tachycardic, no gallops, lungs with diffuse bilateral wheezing, poor ventilation, abdomen was soft and nondistended, nontender, lower extremities with no edema. Sodium 140, potassium 4.2, chloride 101, bicarbonate 27, glucose 155, BUN 15, creatinine 1.05, venous lactic acid 2.7, white count 18.2, hemoglobin 15.1, hematocrit 44.7, platelets 293, chest x-ray with poor inspiration, left rotation, positive air trapping, no effusions, infiltrates or pneumothorax. EKG was sinus tachycardia 122 bpm, left axis deviation and poor R-wave progression.  Patient was admitted to the hospital with the working diagnosis acute on chronic hypoxic respiratory failure, due to COPD exacerbation.   Assessment & Plan:   Principal Problem:   Acute on chronic respiratory failure with hypoxia (HCC) Active Problems:   Essential hypertension, benign   COPD exacerbation (HCC)   SIRS (systemic inflammatory response syndrome) (HCC)   Hyperglycemia   Tachycardia   1. Acute on chronic hypoxic respiratory failure due to COPD exacerbation. Continue aggressive bronchodilator and systemic steroids. Antibiotic therapy with Azithromycin. Has responded well to hypertonic saline nebs, chest PT, flutter valve and guaifenesin Dm. Will transfer to the medical ward, physical therapy evaluation and out of bed tid with meals.    2.  Hypertension. Continue 50 mg hydralazine bid for blood pressure control, systolic blood pressure 916.    3. History of tobacco abuse. Smoking cessation.  5. Steroid induced hyperglycemia. On insulin sliding scale for glucose monitoring and coverage. Capillary glucose 198, 189, 160.   DVT prophylaxis:enoxaparin Code Status:full Family Communication: Disposition Plan:Home   Consultants:    Procedures:    Antimicrobials:     Subjective: Patient with significant improvement in his dyspnea, improved clearance of secretions, no chest pain, no nausea or vomiting, good appetite.   Objective: Vitals:   09/29/17 0300 09/29/17 0400 09/29/17 0500 09/29/17 0600  BP: 138/84 133/85 115/75 131/66  Pulse: 72 81 72 71  Resp: 20 (!) 21 (!) 22 (!) 23  Temp:  98.1 F (36.7 C)    TempSrc:  Oral    SpO2: 95% 93% 92% 95%  Weight:   70.1 kg (154 lb 8.7 oz)   Height:        Intake/Output Summary (Last 24 hours) at 09/29/17 0731 Last data filed at 09/29/17 0300  Gross per 24 hour  Intake              840 ml  Output             1075 ml  Net             -235 ml   Filed Weights   09/27/17 0453 09/28/17 0455 09/29/17 0500  Weight: 69.5 kg (153 lb 3.5 oz) 70.1 kg (154 lb 8.7 oz) 70.1 kg (154 lb 8.7 oz)    Examination:   General: deconditioned Neurology: Awake and alert, non focal  E ENT: no pallor, no icterus, oral mucosa moist Cardiovascular: No JVD. S1-S2 present, rhythmic, no gallops, rubs, or murmurs. Trace lower extremity edema. Pulmonary: decreased breath sounds bilaterally, improved  air movement with better ventilation, no wheezing, or rhonchi, scattered  rales. Gastrointestinal. Abdomen flat, no organomegaly, non tender, no rebound or guarding Skin. No rashes Musculoskeletal: no joint deformities     Data Reviewed: I have personally reviewed following labs and imaging studies  CBC:  Recent Labs Lab 09/26/17 1357 09/27/17 0433 09/28/17 0407  09/29/17 0517  WBC 18.2* 15.9* 24.4* 21.5*  NEUTROABS 15.0*  --  21.4* 18.9*  HGB 15.1 12.3* 12.2* 12.2*  HCT 44.7 36.8* 36.8* 35.9*  MCV 89.9 89.3 89.3 88.9  PLT 293 258 287 251   Basic Metabolic Panel:  Recent Labs Lab 09/26/17 1357 09/27/17 0433 09/28/17 0407 09/29/17 0517  NA 140 139 140 142  K 4.2 3.4* 3.5 3.9  CL 101 107 109 109  CO2 27 23 22 24   GLUCOSE 155* 278* 229* 201*  BUN 15 15 19  23*  CREATININE 1.05 0.80 0.80 0.79  CALCIUM 9.7 8.3* 8.5* 8.6*   GFR: Estimated Creatinine Clearance: 66.2 mL/min (by C-G formula based on SCr of 0.79 mg/dL). Liver Function Tests: No results for input(s): AST, ALT, ALKPHOS, BILITOT, PROT, ALBUMIN in the last 168 hours. No results for input(s): LIPASE, AMYLASE in the last 168 hours. No results for input(s): AMMONIA in the last 168 hours. Coagulation Profile: No results for input(s): INR, PROTIME in the last 168 hours. Cardiac Enzymes:  Recent Labs Lab 09/26/17 1357  TROPONINI <0.03   BNP (last 3 results) No results for input(s): PROBNP in the last 8760 hours. HbA1C:  Recent Labs  09/27/17 0433  HGBA1C 7.1*   CBG:  Recent Labs Lab 09/27/17 2105 09/28/17 0807 09/28/17 1125 09/28/17 1621 09/28/17 2119  GLUCAP 173* 206* 262* 132* 177*   Lipid Profile: No results for input(s): CHOL, HDL, LDLCALC, TRIG, CHOLHDL, LDLDIRECT in the last 72 hours. Thyroid Function Tests: No results for input(s): TSH, T4TOTAL, FREET4, T3FREE, THYROIDAB in the last 72 hours. Anemia Panel: No results for input(s): VITAMINB12, FOLATE, FERRITIN, TIBC, IRON, RETICCTPCT in the last 72 hours.    Radiology Studies: I have reviewed all of the imaging during this hospital visit personally     Scheduled Meds: . azithromycin  500 mg Oral Daily  . enoxaparin (LOVENOX) injection  40 mg Subcutaneous Q24H  . guaiFENesin-dextromethorphan  10 mL Oral Q6H  . hydrALAZINE  50 mg Oral BID WC  . insulin aspart  0-15 Units Subcutaneous TID WC  .  insulin aspart  0-5 Units Subcutaneous QHS  . ipratropium-albuterol  3 mL Nebulization QID  . methylPREDNISolone (SOLU-MEDROL) injection  40 mg Intravenous Q8H  . mometasone-formoterol  2 puff Inhalation BID  . sodium chloride HYPERTONIC  4 mL Nebulization BID   Continuous Infusions:   LOS: 3 days        Mauricio Gerome Apley, MD [facility] Pager [phone]

## 2017-09-30 LAB — BASIC METABOLIC PANEL
ANION GAP: 8 (ref 5–15)
BUN: 23 mg/dL — ABNORMAL HIGH (ref 6–20)
CALCIUM: 8.6 mg/dL — AB (ref 8.9–10.3)
CO2: 29 mmol/L (ref 22–32)
Chloride: 104 mmol/L (ref 101–111)
Creatinine, Ser: 0.85 mg/dL (ref 0.61–1.24)
Glucose, Bld: 159 mg/dL — ABNORMAL HIGH (ref 65–99)
POTASSIUM: 3.7 mmol/L (ref 3.5–5.1)
Sodium: 141 mmol/L (ref 135–145)

## 2017-09-30 LAB — GLUCOSE, CAPILLARY
GLUCOSE-CAPILLARY: 147 mg/dL — AB (ref 65–99)
GLUCOSE-CAPILLARY: 212 mg/dL — AB (ref 65–99)
GLUCOSE-CAPILLARY: 121 mg/dL — AB (ref 65–99)
GLUCOSE-CAPILLARY: 164 mg/dL — AB (ref 65–99)

## 2017-09-30 MED ORDER — METHYLPREDNISOLONE SODIUM SUCC 40 MG IJ SOLR
40.0000 mg | Freq: Four times a day (QID) | INTRAMUSCULAR | Status: DC
  Administered 2017-09-30 – 2017-10-02 (×8): 40 mg via INTRAVENOUS
  Filled 2017-09-30 (×9): qty 1

## 2017-09-30 MED ORDER — IPRATROPIUM-ALBUTEROL 0.5-2.5 (3) MG/3ML IN SOLN
3.0000 mL | Freq: Three times a day (TID) | RESPIRATORY_TRACT | Status: DC
  Administered 2017-09-30 – 2017-10-02 (×7): 3 mL via RESPIRATORY_TRACT
  Filled 2017-09-30 (×7): qty 3

## 2017-09-30 MED ORDER — GUAIFENESIN ER 600 MG PO TB12
1200.0000 mg | ORAL_TABLET | Freq: Two times a day (BID) | ORAL | Status: DC
  Administered 2017-09-30 – 2017-10-02 (×4): 1200 mg via ORAL
  Filled 2017-09-30 (×4): qty 2

## 2017-09-30 NOTE — Progress Notes (Signed)
PROGRESS NOTE    Michael Evans  [id number] DOB: Aug 19, 1941 DOA: 09/26/2017 PCP: [name], MD    Brief Narrative:  76 year old male who presented with dyspnea. Patient does have a significant past medical history COPD with chronic hypoxic respiratory failure, sleep apnea and hypertension. Patient presents with worsening dyspnea, associated with the persistent productive cough. On the initial physical examination, blood pressure 115/95, heart rate 124, temperature 98.4, respiratory rate 23, oxygen saturation 96% on supplemental oxygen. Moist mucous membranes, heart S1-S2 present, and rhythmic, tachycardic, no gallops, lungs with diffuse bilateral wheezing, poor ventilation, abdomen was soft and nondistended, nontender, lower extremities with no edema. Sodium 140, potassium 4.2, chloride 101, bicarbonate 27, glucose 155, BUN 15, creatinine 1.05, venous lactic acid 2.7, white count 18.2, hemoglobin 15.1, hematocrit 44.7, platelets 293, chest x-ray with poor inspiration, left rotation, positive air trapping, no effusions, infiltrates or pneumothorax. EKG was sinus tachycardia 122 bpm, left axis deviation and poor R-wave progression.  Patient was admitted to the hospital with the working diagnosis acute on chronic hypoxic respiratory failure, due to COPD exacerbation.   Assessment & Plan:   Principal Problem:   Acute on chronic respiratory failure with hypoxia (HCC) Active Problems:   Essential hypertension, benign   COPD exacerbation (HCC)   SIRS (systemic inflammatory response syndrome) (HCC)   Hyperglycemia   Tachycardia   1. Acute on chronic hypoxic respiratory failure due to COPD exacerbation. Continue aggressive bronchodilator and systemic steroids. Antibiotic therapy with Azithromycin. Has responded well to hypertonic saline nebs, chest PT, flutter valve and guaifenesin Dm. He still does not feel back to baseline. Since he is still wheezing, will increase solumedrol dosing.      2. Hypertension. Continue 50 mg hydralazine bid for blood pressure control, blood pressures have been stable.    3. History of tobacco abuse. Smoking cessation.  5. Steroid induced hyperglycemia. On insulin sliding scale for glucose monitoring and coverage. Capillary glucose have been stable  DVT prophylaxis:enoxaparin Code Status:full Family Communication:no family present Disposition Plan:Home   Consultants:    Procedures:    Antimicrobials:     Subjective: Continues to feel short of breath and not back to baseline  Objective: Vitals:   09/30/17 1428 09/30/17 2038 09/30/17 2047 09/30/17 2049  BP:  133/88    Pulse:  85    Resp:  (!) 22    Temp:  98.7 F (37.1 C)    TempSrc:  Oral    SpO2: 92% 95% 94% 94%  Weight:      Height:        Intake/Output Summary (Last 24 hours) at 09/30/17 2108 Last data filed at 09/30/17 4481  Gross per 24 hour  Intake              120 ml  Output              525 ml  Net             -405 ml   Filed Weights   09/27/17 0453 09/28/17 0455 09/29/17 0500  Weight: 69.5 kg (153 lb 3.5 oz) 70.1 kg (154 lb 8.7 oz) 70.1 kg (154 lb 8.7 oz)    Examination:   General: deconditioned Neurology: Awake and alert, non focal  E ENT: no pallor, no icterus, oral mucosa moist Cardiovascular: No JVD. S1-S2 present, rhythmic, no gallops, rubs, or murmurs. Trace lower extremity edema. Pulmonary: decreased breath sounds bilaterally, scattered wheezing. Gastrointestinal. Abdomen flat, no organomegaly, non tender, no rebound or guarding  Skin. No rashes Musculoskeletal: no joint deformities     Data Reviewed: I have personally reviewed following labs and imaging studies  CBC:  Recent Labs Lab 09/26/17 1357 09/27/17 0433 09/28/17 0407 09/29/17 0517  WBC 18.2* 15.9* 24.4* 21.5*  NEUTROABS 15.0*  --  21.4* 18.9*  HGB 15.1 12.3* 12.2* 12.2*  HCT 44.7 36.8* 36.8* 35.9*  MCV 89.9 89.3 89.3 88.9  PLT 293 258 287 300    Basic Metabolic Panel:  Recent Labs Lab 09/26/17 1357 09/27/17 0433 09/28/17 0407 09/29/17 0517 09/30/17 0607  NA 140 139 140 142 141  K 4.2 3.4* 3.5 3.9 3.7  CL 101 107 109 109 104  CO2 27 23 22 24 29   GLUCOSE 155* 278* 229* 201* 159*  BUN 15 15 19  23* 23*  CREATININE 1.05 0.80 0.80 0.79 0.85  CALCIUM 9.7 8.3* 8.5* 8.6* 8.6*   GFR: Estimated Creatinine Clearance: 62.3 mL/min (by C-G formula based on SCr of 0.85 mg/dL). Liver Function Tests: No results for input(s): AST, ALT, ALKPHOS, BILITOT, PROT, ALBUMIN in the last 168 hours. No results for input(s): LIPASE, AMYLASE in the last 168 hours. No results for input(s): AMMONIA in the last 168 hours. Coagulation Profile: No results for input(s): INR, PROTIME in the last 168 hours. Cardiac Enzymes:  Recent Labs Lab 09/26/17 1357  TROPONINI <0.03   BNP (last 3 results) No results for input(s): PROBNP in the last 8760 hours. HbA1C: No results for input(s): HGBA1C in the last 72 hours. CBG:  Recent Labs Lab 09/29/17 2136 09/30/17 0721 09/30/17 1121 09/30/17 1632 09/30/17 2033  GLUCAP 179* 147* 212* 121* 164*   Lipid Profile: No results for input(s): CHOL, HDL, LDLCALC, TRIG, CHOLHDL, LDLDIRECT in the last 72 hours. Thyroid Function Tests: No results for input(s): TSH, T4TOTAL, FREET4, T3FREE, THYROIDAB in the last 72 hours. Anemia Panel: No results for input(s): VITAMINB12, FOLATE, FERRITIN, TIBC, IRON, RETICCTPCT in the last 72 hours.    Radiology Studies: I have reviewed all of the imaging during this hospital visit personally     Scheduled Meds: . azithromycin  500 mg Oral Daily  . enoxaparin (LOVENOX) injection  40 mg Subcutaneous Q24H  . guaiFENesin  1,200 mg Oral BID  . hydrALAZINE  50 mg Oral BID WC  . insulin aspart  0-15 Units Subcutaneous TID WC  . insulin aspart  0-5 Units Subcutaneous QHS  . ipratropium-albuterol  3 mL Nebulization TID  . methylPREDNISolone (SOLU-MEDROL) injection  40 mg  Intravenous Q6H  . mometasone-formoterol  2 puff Inhalation BID  . sodium chloride HYPERTONIC  4 mL Nebulization BID   Continuous Infusions:   LOS: 4 days        Mirabella Hilario, MD [facility] Pager [phone]

## 2017-09-30 NOTE — Care Management Important Message (Signed)
Important Message  Patient Details  Name: MALLORY ENRIQUES MRN: [id number] Date of Birth: 1941-07-10   Medicare Important Message Given:  Yes    Sherald Barge, RN 09/30/2017, 1:11 PM

## 2017-09-30 NOTE — Care Management Note (Signed)
Case Management Note  Patient Details  Name: Michael Evans MRN: [id number] Date of Birth: 1941-10-20  Subjective/Objective:                 Pt admitted with COPD exacerbation. He is from home, ind with ADL's at baseline. He has sisters who live in [place] and a brother who lives close by. He says "we (him and his brother) take care of each other". Pt very active at baseline, says he gardens, picks sweet potatoes. He has PCP who he see's regularly. He drives himself to appointments. He manages his own medications and takes them as prescribed. He prepares his own meals. He has home oxygen with port tank in the room. He does not have a telephone or cell phone. Pt says "his sisters want him" and becomes tearful, in clarifying what pt means, his sister want him to come stay with them after he is discharged, temporarily. He plans to stay with them but wants to go back home quickly. Pt has no DME pta. CM offered to have cane or RW ordered, as he says he is going to his sisters because he is too weak to care for himself, pt declines DME saying he "gets around okay". We discuss HH, pt staying with his sisters and does not feel [facility] would be of benefit with them taking care of him.  Action/Plan: Pt will DC home with self care, going to stay with sisters. His port oxygen tank is in his room for discharge. Pt seems very concerned about staying in his home though he does not elaborate on why he feels he may not be able to stay. CM discussed [facility] services and how he is eligible. He would not be a candidate for [facility] calls as he does not have home phone or cell phone and is unsure how long he will be staying with sisters.   Expected Discharge Date:     09/30/2017             Expected Discharge Plan:  Home/Self Care  In-House Referral:  NA  Discharge planning Services  CM Consult  Post Acute Care Choice:  NA Choice offered to:  NA  Status of Service:  Completed, signed off  Sherald Barge,  RN 09/30/2017, 1:02 PM

## 2017-10-01 LAB — GLUCOSE, CAPILLARY
GLUCOSE-CAPILLARY: 177 mg/dL — AB (ref 65–99)
GLUCOSE-CAPILLARY: 319 mg/dL — AB (ref 65–99)
Glucose-Capillary: 127 mg/dL — ABNORMAL HIGH (ref 65–99)
Glucose-Capillary: 212 mg/dL — ABNORMAL HIGH (ref 65–99)

## 2017-10-01 NOTE — Progress Notes (Signed)
PROGRESS NOTE    Michael Evans  DDU:[id number] DOB: [birth year] DOA: 09/26/2017 PCP: [name], MD    Brief Narrative:  77 year old male who presented with dyspnea. Patient does have a significant past medical history COPD with chronic hypoxic respiratory failure, sleep apnea and hypertension. Patient presents with worsening dyspnea, associated with the persistent productive cough. On the initial physical examination, blood pressure 115/95, heart rate 124, temperature 98.4, respiratory rate 23, oxygen saturation 96% on supplemental oxygen. Moist mucous membranes, heart S1-S2 present, and rhythmic, tachycardic, no gallops, lungs with diffuse bilateral wheezing, poor ventilation, abdomen was soft and nondistended, nontender, lower extremities with no edema. Sodium 140, potassium 4.2, chloride 101, bicarbonate 27, glucose 155, BUN 15, creatinine 1.05, venous lactic acid 2.7, white count 18.2, hemoglobin 15.1, hematocrit 44.7, platelets 293, chest x-ray with poor inspiration, left rotation, positive air trapping, no effusions, infiltrates or pneumothorax. EKG was sinus tachycardia 122 bpm, left axis deviation and poor R-wave progression.  Patient was admitted to the hospital with the working diagnosis acute on chronic hypoxic respiratory failure, due to COPD exacerbation.   Assessment & Plan:   Principal Problem:   Acute on chronic respiratory failure with hypoxia (HCC) Active Problems:   Essential hypertension, benign   COPD exacerbation (HCC)   SIRS (systemic inflammatory response syndrome) (HCC)   Hyperglycemia   Tachycardia   1. Acute on chronic hypoxic respiratory failure due to COPD exacerbation. Continue aggressive bronchodilator and systemic steroids. Antibiotic therapy with Azithromycin. Has responded well to hypertonic saline nebs, chest PT, flutter valve and guaifenesin Dm. He still does not feel back to baseline, but slowly improving. Continue current treatments for another  24 hours. If continues to improve, can consider discharge home in AM.    2. Hypertension. Continue 50 mg hydralazine bid for blood pressure control, blood pressures have been stable.    3. History of tobacco abuse. Smoking cessation.  5. Steroid induced hyperglycemia. On insulin sliding scale for glucose monitoring and coverage. Capillary glucose have been stable  DVT prophylaxis:enoxaparin Code Status:full Family Communication:discussed with sister at the bedside Disposition Plan:Home   Consultants:    Procedures:    Antimicrobials:     Subjective: Continues to have productive cough. Does not feel shortness of breath back to baseline  Objective: Vitals:   10/01/17 0901 10/01/17 0908 10/01/17 1430 10/01/17 1448  BP:    (!) 174/92  Pulse:    (!) 103  Resp:    18  Temp:    98.1 F (36.7 C)  TempSrc:    Oral  SpO2: 96% 97% 94% 90%  Weight:      Height:        Intake/Output Summary (Last 24 hours) at 10/01/17 1804 Last data filed at 10/01/17 1700  Gross per 24 hour  Intake              720 ml  Output              200 ml  Net              520 ml   Filed Weights   09/27/17 0453 09/28/17 0455 09/29/17 0500  Weight: 69.5 kg (153 lb 3.5 oz) 70.1 kg (154 lb 8.7 oz) 70.1 kg (154 lb 8.7 oz)    Examination:   General: deconditioned Neurology: Awake and alert, non focal  E ENT: no pallor, no icterus, oral mucosa moist Cardiovascular: No JVD. S1-S2 present, rhythmic, no gallops, rubs, or murmurs. Trace lower extremity edema.  Pulmonary: fair air movement bilaterally. Mild wheeze Gastrointestinal. Abdomen flat, no organomegaly, non tender, no rebound or guarding Skin. No rashes Musculoskeletal: no joint deformities     Data Reviewed: I have personally reviewed following labs and imaging studies  CBC:  Recent Labs Lab 09/26/17 1357 09/27/17 0433 09/28/17 0407 09/29/17 0517  WBC 18.2* 15.9* 24.4* 21.5*  NEUTROABS 15.0*  --  21.4* 18.9*    HGB 15.1 12.3* 12.2* 12.2*  HCT 44.7 36.8* 36.8* 35.9*  MCV 89.9 89.3 89.3 88.9  PLT 293 258 287 829   Basic Metabolic Panel:  Recent Labs Lab 09/26/17 1357 09/27/17 0433 09/28/17 0407 09/29/17 0517 09/30/17 0607  NA 140 139 140 142 141  K 4.2 3.4* 3.5 3.9 3.7  CL 101 107 109 109 104  CO2 27 23 22 24 29   GLUCOSE 155* 278* 229* 201* 159*  BUN 15 15 19  23* 23*  CREATININE 1.05 0.80 0.80 0.79 0.85  CALCIUM 9.7 8.3* 8.5* 8.6* 8.6*   GFR: Estimated Creatinine Clearance: 62.3 mL/min (by C-G formula based on SCr of 0.85 mg/dL). Liver Function Tests: No results for input(s): AST, ALT, ALKPHOS, BILITOT, PROT, ALBUMIN in the last 168 hours. No results for input(s): LIPASE, AMYLASE in the last 168 hours. No results for input(s): AMMONIA in the last 168 hours. Coagulation Profile: No results for input(s): INR, PROTIME in the last 168 hours. Cardiac Enzymes:  Recent Labs Lab 09/26/17 1357  TROPONINI <0.03   BNP (last 3 results) No results for input(s): PROBNP in the last 8760 hours. HbA1C: No results for input(s): HGBA1C in the last 72 hours. CBG:  Recent Labs Lab 09/30/17 1632 09/30/17 2033 10/01/17 0731 10/01/17 1118 10/01/17 1611  GLUCAP 121* 164* 177* 319* 127*   Lipid Profile: No results for input(s): CHOL, HDL, LDLCALC, TRIG, CHOLHDL, LDLDIRECT in the last 72 hours. Thyroid Function Tests: No results for input(s): TSH, T4TOTAL, FREET4, T3FREE, THYROIDAB in the last 72 hours. Anemia Panel: No results for input(s): VITAMINB12, FOLATE, FERRITIN, TIBC, IRON, RETICCTPCT in the last 72 hours.    Radiology Studies: I have reviewed all of the imaging during this hospital visit personally     Scheduled Meds: . azithromycin  500 mg Oral Daily  . enoxaparin (LOVENOX) injection  40 mg Subcutaneous Q24H  . guaiFENesin  1,200 mg Oral BID  . hydrALAZINE  50 mg Oral BID WC  . insulin aspart  0-15 Units Subcutaneous TID WC  . insulin aspart  0-5 Units Subcutaneous  QHS  . ipratropium-albuterol  3 mL Nebulization TID  . methylPREDNISolone (SOLU-MEDROL) injection  40 mg Intravenous Q6H  . mometasone-formoterol  2 puff Inhalation BID  . sodium chloride HYPERTONIC  4 mL Nebulization BID   Continuous Infusions:   LOS: 5 days        Jarion Hawthorne, MD [facility] Pager [phone]

## 2017-10-02 LAB — GLUCOSE, CAPILLARY
GLUCOSE-CAPILLARY: 223 mg/dL — AB (ref 65–99)
GLUCOSE-CAPILLARY: 312 mg/dL — AB (ref 65–99)
Glucose-Capillary: 242 mg/dL — ABNORMAL HIGH (ref 65–99)

## 2017-10-02 MED ORDER — PREDNISONE 10 MG PO TABS: ORAL_TABLET | ORAL | 0 refills | Status: DC

## 2017-10-02 MED ORDER — ALBUTEROL SULFATE (2.5 MG/3ML) 0.083% IN NEBU: 2.5000 mg | INHALATION_SOLUTION | RESPIRATORY_TRACT | 11 refills | Status: DC

## 2017-10-02 MED ORDER — GUAIFENESIN ER 600 MG PO TB12: 600.0000 mg | ORAL_TABLET | Freq: Two times a day (BID) | ORAL | 0 refills | Status: DC

## 2017-10-02 MED ORDER — HYDRALAZINE HCL 50 MG PO TABS: 50.0000 mg | ORAL_TABLET | Freq: Two times a day (BID) | ORAL | 0 refills | Status: DC

## 2017-10-02 NOTE — Care Management Important Message (Signed)
Important Message  Patient Details  Name: Michael Evans MRN: [id number] Date of Birth: 08/04/1941   Medicare Important Message Given:  Yes    Sherald Barge, RN 10/02/2017, 1:03 PM

## 2017-10-02 NOTE — Progress Notes (Signed)
Inpatient Diabetes Program Recommendations  AACE/ADA: New Consensus Statement on Inpatient Glycemic Control (2015)  Target Ranges:  Prepandial:   less than 140 mg/dL      Peak postprandial:   less than 180 mg/dL (1-2 hours)      Critically ill patients:  140 - 180 mg/dL   Lab Results  Component Value Date   GLUCAP 312 (H) 10/02/2017   HGBA1C 7.1 (H) 09/27/2017    Review of Glycemic Control Results for [name], Michael Evans (MRN [id number]) as of 10/02/2017 13:25  Ref. Range 10/01/2017 11:18 10/01/2017 16:11 10/01/2017 21:07 10/02/2017 08:23 10/02/2017 10:55  Glucose-Capillary Latest Ref Range: 65 - 99 mg/dL 319 (H) 127 (H) 212 (H) 223 (H) 312 (H)   Diabetes history: No prior hx Current orders for Inpatient glycemic control: Novolog correction moderate tid + hs  Inpatient Diabetes Program Recommendations:  Please consider Lantus 10 units while on steroids.  Thank you, Nani Gasser. Marquee Fuchs, RN, MSN, CDE  Diabetes Coordinator Inpatient Glycemic Control Team Team Pager [phone] (8am-5pm) 10/02/2017 1:27 PM

## 2017-10-02 NOTE — Progress Notes (Signed)
Removed pt IV. Clean, dry, intact. Reviewed d/c paperwork with pt. With teachback. Sent pt with belongings and o2 tank. Nephew and pt walked to car.

## 2017-10-02 NOTE — Discharge Summary (Signed)
Physician Discharge Summary  Michael Evans SWF:[id number] DOB: December 31, 1940 DOA: 09/26/2017  PCP: [name], MD  Admit date: 09/26/2017 Discharge date: 10/02/2017  Admitted From: Home Disposition: Home  Recommendations for Outpatient Follow-up:  1. Follow up with PCP in 1-2 weeks 2. Please obtain BMP/CBC in one week  Home Health: Equipment/Devices: Continue home oxygen at 2-3 L  Discharge Condition: Stable CODE STATUS: Full code Diet recommendation: Heart Healthy   Brief/Interim Summary: 76 year old male who presented with dyspnea. Patient does have a significant past medical history COPD with chronic hypoxic respiratory failure, sleep apnea and hypertension. Patient presents with worsening dyspnea, associated with the persistent productive cough. On the initial physical examination, blood pressure 115/95, heart rate 124, temperature 98.4, respiratory rate 23, oxygen saturation 96% on supplemental oxygen. Moist mucous membranes, heart S1-S2 present, and rhythmic, tachycardic, no gallops, lungs with diffuse bilateral wheezing, poor ventilation, abdomen was soft and nondistended, nontender, lower extremities with no edema. Sodium 140, potassium 4.2, chloride 101, bicarbonate 27, glucose 155, BUN 15, creatinine 1.05, venous lactic acid 2.7, white count 18.2, hemoglobin 15.1, hematocrit 44.7, platelets 293, chest x-ray with poor inspiration, left rotation, positive air trapping, no effusions, infiltrates or pneumothorax. EKG was sinus tachycardia 122 bpm, left axis deviation and poor R-wave progression.  Patient was admitted to the hospital with the working diagnosis acute on chronic hypoxic respiratory failure, due to COPD exacerbation.  Discharge Diagnoses:  Principal Problem:   Acute on chronic respiratory failure with hypoxia (HCC) Active Problems:   Essential hypertension, benign   COPD exacerbation (HCC)   SIRS (systemic inflammatory response syndrome) (HCC)   Hyperglycemia    Tachycardia  1. Acute on chronic hypoxic respiratory failure due to COPD exacerbation.  He was treated with aggressive bronchodilator and systemic steroids.  He completed an adequate course of antibiotic therapy with Azithromycin. Has responded well to hypertonic saline nebs, chest PT, flutter valve and guaifenesin.  Appears to be approaching baseline.  Transition steroids to prednisone taper.   2. Hypertension. Continue 50 mg hydralazine bid for blood pressure control, blood pressures have been stable.    3. History of tobacco abuse. Smoking cessation.  5. Steroid induced hyperglycemia.  Was treated with insulin sliding scale for glucose monitoring and coverage. Capillary glucose have been stable.  Anticipate that blood sugar should continue to improve as steroids are tapered  Discharge Instructions  Discharge Instructions    Diet - low sodium heart healthy    Complete by:  As directed    Increase activity slowly    Complete by:  As directed      Allergies as of 10/02/2017      Reactions   Amlodipine Swelling   Swelling in feet   Aspirin Other (See Comments)   Upset stomach.   Other Hypertension   Hazelnuts   Penicillins Hives   Has patient had a PCN reaction causing immediate rash, facial/tongue/throat swelling, SOB or lightheadedness with hypotension: Yes Has patient had a PCN reaction causing severe rash involving mucus membranes or skin necrosis: No Has patient had a PCN reaction that required hospitalization No Has patient had a PCN reaction occurring within the last 10 years: No If all of the above answers are "NO", then may proceed with Cephalosporin use.   Sulfa Antibiotics Hives      Medication List    STOP taking these medications   levofloxacin 500 MG tablet Commonly known as:  LEVAQUIN     TAKE these medications   albuterol 108 (90  Base) MCG/ACT inhaler Commonly known as:  PROVENTIL HFA;VENTOLIN HFA Inhale 1-2 puffs into the lungs every 6 (six) hours as  needed for wheezing or shortness of breath.   albuterol (2.5 MG/3ML) 0.083% nebulizer solution Commonly known as:  PROVENTIL Take 3 mLs (2.5 mg total) by nebulization every 4 (four) hours. And as needed for shortness of breath   cyanocobalamin 2000 MCG tablet Take 2,000 mcg by mouth daily.   FISH OIL CONCENTRATE PO Take 1 capsule by mouth daily.   guaiFENesin 600 MG 12 hr tablet Commonly known as:  MUCINEX Take 1 tablet (600 mg total) by mouth 2 (two) times daily.   hydrALAZINE 50 MG tablet Commonly known as:  APRESOLINE Take 1 tablet (50 mg total) by mouth 2 (two) times daily with a meal.   multivitamin tablet Take 1 tablet by mouth daily.   OXYGEN Inhale 2 L into the lungs continuous.   predniSONE 10 MG tablet Commonly known as:  DELTASONE Take 40mg  po daily for 2 days then 30mg  daily for 2 days then 20mg  daily for 2 days then 10mg  daily for 2 days then stop What changed:  medication strength  how much to take  how to take this  when to take this  additional instructions   pyridoxine 100 MG tablet Commonly known as:  B-6 Take 100 mg by mouth daily.   SYMBICORT 160-4.5 MCG/ACT inhaler Generic drug:  budesonide-formoterol INHALE 2 PUFFS INTO THE LUNGS 2 (TWO) TIMES DAILY.       Allergies  Allergen Reactions  . Amlodipine Swelling    Swelling in feet  . Aspirin Other (See Comments)    Upset stomach.  . Other Hypertension    Hazelnuts  . Penicillins Hives    Has patient had a PCN reaction causing immediate rash, facial/tongue/throat swelling, SOB or lightheadedness with hypotension: Yes Has patient had a PCN reaction causing severe rash involving mucus membranes or skin necrosis: No Has patient had a PCN reaction that required hospitalization No Has patient had a PCN reaction occurring within the last 10 years: No If all of the above answers are "NO", then may proceed with Cephalosporin use.   . Sulfa Antibiotics Hives     Consultations:     Procedures/Studies: Dg Chest Port 1 View  Result Date: 09/26/2017 CLINICAL DATA:  Worsened SOB, on oxygen all the time, Coughing up phlegm EXAM: PORTABLE CHEST 1 VIEW COMPARISON:  Chest x-rays dated 12/03/2016 and 01/21/2016. Chest CT dated 01/26/2017. FINDINGS: Heart size and mediastinal contours are within normal limits. Atherosclerotic changes noted at the aortic arch. Chronic bronchitic changes again noted centrally. No new lung findings. No pleural effusion or pneumothorax seen. Emphysematous changes better demonstrated on earlier chest CT of 01/26/2017. No acute or suspicious osseous finding. IMPRESSION: 1. No active disease.  No evidence of pneumonia or pulmonary edema. 2. Emphysema and chronic bronchitic changes. 3. Aortic atherosclerosis. Electronically Signed   By: Franki Cabot M.D.   On: 09/26/2017 14:38       Subjective: Breathing improving.  Continues to have productive cough, but overall feels breathing is improving.  Discharge Exam: Vitals:   10/02/17 1324 10/02/17 1346  BP:  (!) 111/94  Pulse:  96  Resp:  18  Temp:  97.7 F (36.5 C)  SpO2: 93% 97%   Vitals:   10/02/17 0739 10/02/17 1126 10/02/17 1324 10/02/17 1346  BP:  (!) 158/92  (!) 111/94  Pulse:  (!) 102  96  Resp:    18  Temp:  98.9 F (37.2 C)  97.7 F (36.5 C)  TempSrc:  Oral  Oral  SpO2: 93%  93% 97%  Weight:      Height:        General: Pt is alert, awake, not in acute distress Cardiovascular: RRR, S1/S2 +, no rubs, no gallops Respiratory: CTA bilaterally, no wheezing, no rhonchi Abdominal: Soft, NT, ND, bowel sounds + Extremities: no edema, no cyanosis    The results of significant diagnostics from this hospitalization (including imaging, microbiology, ancillary and laboratory) are listed below for reference.     Microbiology: Recent Results (from the past 240 hour(s))  Respiratory Panel by PCR     Status: None   Collection Time: 09/26/17  6:27 PM  Result  Value Ref Range Status   Adenovirus NOT DETECTED NOT DETECTED Final   Coronavirus 229E NOT DETECTED NOT DETECTED Final   Coronavirus HKU1 NOT DETECTED NOT DETECTED Final   Coronavirus NL63 NOT DETECTED NOT DETECTED Final   Coronavirus OC43 NOT DETECTED NOT DETECTED Final   Metapneumovirus NOT DETECTED NOT DETECTED Final   Rhinovirus / Enterovirus NOT DETECTED NOT DETECTED Final   Influenza A NOT DETECTED NOT DETECTED Final   Influenza B NOT DETECTED NOT DETECTED Final   Parainfluenza Virus 1 NOT DETECTED NOT DETECTED Final   Parainfluenza Virus 2 NOT DETECTED NOT DETECTED Final   Parainfluenza Virus 3 NOT DETECTED NOT DETECTED Final   Parainfluenza Virus 4 NOT DETECTED NOT DETECTED Final   Respiratory Syncytial Virus NOT DETECTED NOT DETECTED Final   Bordetella pertussis NOT DETECTED NOT DETECTED Final   Chlamydophila pneumoniae NOT DETECTED NOT DETECTED Final   Mycoplasma pneumoniae NOT DETECTED NOT DETECTED Final    Comment: Performed at [facility], [place] [address] [place], [place] [address]  MRSA PCR Screening     Status: None   Collection Time: 09/26/17  6:27 PM  Result Value Ref Range Status   MRSA by PCR NEGATIVE NEGATIVE Final    Comment:        The GeneXpert MRSA Assay (FDA approved for NASAL specimens only), is one component of a comprehensive MRSA colonization surveillance program. It is not intended to diagnose MRSA infection nor to guide or monitor treatment for MRSA infections.      Labs: BNP (last 3 results)  Recent Labs  11/03/16 0812  BNP 62.9   Basic Metabolic Panel:  Recent Labs Lab 09/26/17 1357 09/27/17 0433 09/28/17 0407 09/29/17 0517 09/30/17 0607  NA 140 139 140 142 141  K 4.2 3.4* 3.5 3.9 3.7  CL 101 107 109 109 104  CO2 27 23 22 24 29   GLUCOSE 155* 278* 229* 201* 159*  BUN 15 15 19  23* 23*  CREATININE 1.05 0.80 0.80 0.79 0.85  CALCIUM 9.7 8.3* 8.5* 8.6* 8.6*   Liver Function Tests: No results for input(s): AST, ALT,  ALKPHOS, BILITOT, PROT, ALBUMIN in the last 168 hours. No results for input(s): LIPASE, AMYLASE in the last 168 hours. No results for input(s): AMMONIA in the last 168 hours. CBC:  Recent Labs Lab 09/26/17 1357 09/27/17 0433 09/28/17 0407 09/29/17 0517  WBC 18.2* 15.9* 24.4* 21.5*  NEUTROABS 15.0*  --  21.4* 18.9*  HGB 15.1 12.3* 12.2* 12.2*  HCT 44.7 36.8* 36.8* 35.9*  MCV 89.9 89.3 89.3 88.9  PLT 293 258 287 274   Cardiac Enzymes:  Recent Labs Lab 09/26/17 1357  TROPONINI <0.03   BNP: Invalid input(s): POCBNP CBG:  Recent Labs Lab 10/01/17  1118 10/01/17 1611 10/01/17 2107 10/02/17 0823 10/02/17 1055  GLUCAP 319* 127* 212* 223* 312*   D-Dimer No results for input(s): DDIMER in the last 72 hours. Hgb A1c No results for input(s): HGBA1C in the last 72 hours. Lipid Profile No results for input(s): CHOL, HDL, LDLCALC, TRIG, CHOLHDL, LDLDIRECT in the last 72 hours. Thyroid function studies No results for input(s): TSH, T4TOTAL, T3FREE, THYROIDAB in the last 72 hours.  Invalid input(s): FREET3 Anemia work up No results for input(s): VITAMINB12, FOLATE, FERRITIN, TIBC, IRON, RETICCTPCT in the last 72 hours. Urinalysis    Component Value Date/Time   COLORURINE YELLOW 01/22/2016 [place] 01/22/2016 0613   LABSPEC 1.025 01/22/2016 0613   PHURINE 5.0 01/22/2016 0613   GLUCOSEU >1000 (A) 01/22/2016 0613   HGBUR NEGATIVE 01/22/2016 0613   BILIRUBINUR NEGATIVE 01/22/2016 0613   KETONESUR NEGATIVE 01/22/2016 0613   PROTEINUR NEGATIVE 01/22/2016 0613   NITRITE NEGATIVE 01/22/2016 0613   LEUKOCYTESUR NEGATIVE 01/22/2016 2706   Sepsis Labs Invalid input(s): PROCALCITONIN,  WBC,  LACTICIDVEN Microbiology Recent Results (from the past 240 hour(s))  Respiratory Panel by PCR     Status: None   Collection Time: 09/26/17  6:27 PM  Result Value Ref Range Status   Adenovirus NOT DETECTED NOT DETECTED Final   Coronavirus 229E NOT DETECTED NOT DETECTED  Final   Coronavirus HKU1 NOT DETECTED NOT DETECTED Final   Coronavirus NL63 NOT DETECTED NOT DETECTED Final   Coronavirus OC43 NOT DETECTED NOT DETECTED Final   Metapneumovirus NOT DETECTED NOT DETECTED Final   Rhinovirus / Enterovirus NOT DETECTED NOT DETECTED Final   Influenza A NOT DETECTED NOT DETECTED Final   Influenza B NOT DETECTED NOT DETECTED Final   Parainfluenza Virus 1 NOT DETECTED NOT DETECTED Final   Parainfluenza Virus 2 NOT DETECTED NOT DETECTED Final   Parainfluenza Virus 3 NOT DETECTED NOT DETECTED Final   Parainfluenza Virus 4 NOT DETECTED NOT DETECTED Final   Respiratory Syncytial Virus NOT DETECTED NOT DETECTED Final   Bordetella pertussis NOT DETECTED NOT DETECTED Final   Chlamydophila pneumoniae NOT DETECTED NOT DETECTED Final   Mycoplasma pneumoniae NOT DETECTED NOT DETECTED Final    Comment: Performed at [facility], [place] [address], [place], [place] [address]  MRSA PCR Screening     Status: None   Collection Time: 09/26/17  6:27 PM  Result Value Ref Range Status   MRSA by PCR NEGATIVE NEGATIVE Final    Comment:        The GeneXpert MRSA Assay (FDA approved for NASAL specimens only), is one component of a comprehensive MRSA colonization surveillance program. It is not intended to diagnose MRSA infection nor to guide or monitor treatment for MRSA infections.      Time coordinating discharge: Over 30 minutes  SIGNED:   Kathie Dike, MD  [facility] 10/02/2017, 1:54 PM Pager   If 7PM-7AM, please contact night-coverage [email or web address] Password [email or web address]

## 2017-12-15 ENCOUNTER — Encounter: Payer: Self-pay | Admitting: Family Medicine

## 2018-06-13 ENCOUNTER — Other Ambulatory Visit: Payer: Self-pay | Admitting: Family Medicine

## 2018-06-13 DIAGNOSIS — J441 Chronic obstructive pulmonary disease with (acute) exacerbation: Secondary | ICD-10-CM

## 2018-11-04 ENCOUNTER — Other Ambulatory Visit: Payer: Self-pay | Admitting: Family Medicine

## 2018-11-04 ENCOUNTER — Ambulatory Visit (INDEPENDENT_AMBULATORY_CARE_PROVIDER_SITE_OTHER): Payer: Medicare Other | Admitting: *Deleted

## 2018-11-04 DIAGNOSIS — Z23 Encounter for immunization: Secondary | ICD-10-CM | POA: Diagnosis not present

## 2018-11-04 DIAGNOSIS — J441 Chronic obstructive pulmonary disease with (acute) exacerbation: Secondary | ICD-10-CM

## 2018-11-04 DIAGNOSIS — J439 Emphysema, unspecified: Secondary | ICD-10-CM

## 2018-11-04 MED ORDER — ALBUTEROL SULFATE HFA 108 (90 BASE) MCG/ACT IN AERS: 1.0000 | INHALATION_SPRAY | Freq: Four times a day (QID) | RESPIRATORY_TRACT | 3 refills | Status: DC | PRN

## 2018-11-04 MED ORDER — BUDESONIDE-FORMOTEROL FUMARATE 160-4.5 MCG/ACT IN AERO: 2.0000 | INHALATION_SPRAY | Freq: Two times a day (BID) | RESPIRATORY_TRACT | 5 refills | Status: DC

## 2018-11-04 MED ORDER — ALBUTEROL SULFATE (2.5 MG/3ML) 0.083% IN NEBU: 2.5000 mg | INHALATION_SOLUTION | RESPIRATORY_TRACT | 11 refills | Status: DC

## 2018-11-04 NOTE — Telephone Encounter (Signed)
Pt needs refill on inhaler and nebulizer solution to [facility] [place].

## 2018-11-04 NOTE — Telephone Encounter (Signed)
Prescription sent to pharmacy.

## 2018-11-05 ENCOUNTER — Other Ambulatory Visit: Payer: Self-pay | Admitting: *Deleted

## 2018-11-05 ENCOUNTER — Telehealth: Payer: Self-pay | Admitting: Family Medicine

## 2018-11-05 DIAGNOSIS — J439 Emphysema, unspecified: Secondary | ICD-10-CM

## 2018-11-05 MED ORDER — ALBUTEROL SULFATE (2.5 MG/3ML) 0.083% IN NEBU: 2.5000 mg | INHALATION_SOLUTION | RESPIRATORY_TRACT | 11 refills | Status: DC

## 2018-11-05 NOTE — Telephone Encounter (Signed)
PA Submitted through [email or web address] and received the following:  Your information has been submitted to [employer]. [employer] will review the request and will issue a decision, typically within 1-3 days from your submission. You can check the updated outcome later by reopening this request.  If [employer] has not responded in 1-3 days or if you have any questions about your ePA request, please contact [employer] at [phone]. If you think there may be a problem with your PA request, use our live chat feature at the bottom right. 

## 2018-11-10 NOTE — Telephone Encounter (Signed)
Denied - need to file through part B - pharmacy made aware

## 2018-11-11 ENCOUNTER — Ambulatory Visit (INDEPENDENT_AMBULATORY_CARE_PROVIDER_SITE_OTHER): Payer: Medicare Other | Admitting: Family Medicine

## 2018-11-11 ENCOUNTER — Other Ambulatory Visit: Payer: Self-pay

## 2018-11-11 VITALS — BP 128/82 | HR 70 | Temp 98.1°F | Resp 18 | Ht 64.0 in | Wt 152.0 lb

## 2018-11-11 DIAGNOSIS — R3589 Other polyuria: Secondary | ICD-10-CM

## 2018-11-11 DIAGNOSIS — Z125 Encounter for screening for malignant neoplasm of prostate: Secondary | ICD-10-CM | POA: Diagnosis not present

## 2018-11-11 DIAGNOSIS — L2089 Other atopic dermatitis: Secondary | ICD-10-CM

## 2018-11-11 DIAGNOSIS — R358 Other polyuria: Secondary | ICD-10-CM | POA: Diagnosis not present

## 2018-11-11 DIAGNOSIS — R350 Frequency of micturition: Secondary | ICD-10-CM

## 2018-11-11 LAB — URINALYSIS, ROUTINE W REFLEX MICROSCOPIC
Bilirubin Urine: NEGATIVE
GLUCOSE, UA: NEGATIVE
HGB URINE DIPSTICK: NEGATIVE
Ketones, ur: NEGATIVE
Leukocytes, UA: NEGATIVE
Nitrite: NEGATIVE
Protein, ur: NEGATIVE
Specific Gravity, Urine: 1.015 (ref 1.001–1.03)
pH: 6.5 (ref 5.0–8.0)

## 2018-11-11 MED ORDER — MOMETASONE FUROATE 0.1 % EX CREA: 1.0000 "application " | TOPICAL_CREAM | Freq: Every day | CUTANEOUS | 1 refills | Status: DC

## 2018-11-11 NOTE — Progress Notes (Signed)
Subjective:    Patient ID: Michael Evans, male    DOB: 02/03/1941, 77 y.o.   MRN: [id number]  Patient has not been seen in over a year.  He presents today with 2 concerns.  #1 he has a rash that occurs on both legs and both arms.  It is characterized by small erythematous papules usually on the lateral aspects of both shins as well as erythematous papules and itchy skin on the dorsal surface of both forearms.  The rash will come and go.  Today there is no visible rash seen.  However he states that it itches terribly when it is there.  He will scratch so hard occasionally will make him bleed.  Inspection of his lateral shin today reveals linear excoriations and a few isolated erythematous papules.  There is no visible rash seen on his forearms.  The rash is located nowhere else on his body.  Second concern is increased urinary frequency.  He states that he has to go to the bathroom to urinate quite often.  He will wake up sometimes 4-5 times every night.  He does report occasional weak stream and dribbling stream.  He denies any dysuria, urgency.  Does occasionally have hesitancy.  Past medical history significant for diabetes.  His hemoglobin A1c has not been checked in more than a year.  At that time it was 7.1..  He denies any polydipsia or blurry vision or weight loss.  Prostate exam was performed today and prostate was slightly enlarged but symmetric and soft without nodularity Past Medical History:  Diagnosis Date  . Arthritis   . Chronic rhinitis   . COPD (chronic obstructive pulmonary disease) (HCC)    PFT 06-26-09 FEV1  1.75( 71%), FVC 3.65( 101%), FEV1% 48, TLC 5.53(104%), DLCO 55%, no BD  . Hypertension   . Nasal septal deviation   . Nocturia   . On home O2    2L N/C  . OSA (obstructive sleep apnea) 04/21/2011  . Skin cancer    Past Surgical History:  Procedure Laterality Date  . APPENDECTOMY    . BACK SURGERY    . CATARACT EXTRACTION W/PHACO  01/06/2013   Procedure: CATARACT  EXTRACTION PHACO AND INTRAOCULAR LENS PLACEMENT (IOC);  Surgeon: Tonny Branch, MD;  Location: [facility];  Service: Ophthalmology;  Laterality: Right;  CDE: 22.17   Current Outpatient Medications on File Prior to Visit  Medication Sig Dispense Refill  . albuterol (PROVENTIL HFA;VENTOLIN HFA) 108 (90 Base) MCG/ACT inhaler Inhale 1-2 puffs into the lungs every 6 (six) hours as needed for wheezing or shortness of breath. 1 Inhaler 3  . albuterol (PROVENTIL) (2.5 MG/3ML) 0.083% nebulizer solution Take 3 mLs (2.5 mg total) by nebulization every 4 (four) hours. And as needed for shortness of breath. Dx: J44.9 150 mL 11  . budesonide-formoterol (SYMBICORT) 160-4.5 MCG/ACT inhaler Inhale 2 puffs into the lungs 2 (two) times daily. 10.2 Inhaler 5  . cyanocobalamin 2000 MCG tablet Take 2,000 mcg by mouth daily.    Marland Kitchen guaiFENesin (MUCINEX) 600 MG 12 hr tablet Take 1 tablet (600 mg total) by mouth 2 (two) times daily. 30 tablet 0  . hydrALAZINE (APRESOLINE) 50 MG tablet Take 1 tablet (50 mg total) by mouth 2 (two) times daily with a meal. 60 tablet 0  . Multiple Vitamin (MULTIVITAMIN) tablet Take 1 tablet by mouth daily.    . Omega-3 Fatty Acids (FISH OIL CONCENTRATE PO) Take 1 capsule by mouth daily.    . OXYGEN Inhale  2 L into the lungs continuous.    Marland Kitchen pyridoxine (B-6) 100 MG tablet Take 100 mg by mouth daily.     No current facility-administered medications on file prior to visit.    Allergies  Allergen Reactions  . Amlodipine Swelling    Swelling in feet  . Aspirin Other (See Comments)    Upset stomach.  . Other Hypertension    Hazelnuts  . Penicillins Hives    Has patient had a PCN reaction causing immediate rash, facial/tongue/throat swelling, SOB or lightheadedness with hypotension: Yes Has patient had a PCN reaction causing severe rash involving mucus membranes or skin necrosis: No Has patient had a PCN reaction that required hospitalization No Has patient had a PCN reaction occurring within the  last 10 years: No If all of the above answers are "NO", then may proceed with Cephalosporin use.   . Sulfa Antibiotics Hives   Social History   Socioeconomic History  . Marital status: Divorced    Spouse name: Not on file  . Number of children: Not on file  . Years of education: Not on file  . Highest education level: Not on file  Occupational History  . Occupation: Neurosurgeon: RETIRED  Social Needs  . Financial resource strain: Not on file  . Food insecurity:    Worry: Not on file    Inability: Not on file  . Transportation needs:    Medical: Not on file    Non-medical: Not on file  Tobacco Use  . Smoking status: Former Smoker    Packs/day: 1.50    Years: 54.00    Pack years: 81.00    Types: Cigarettes    Last attempt to quit: 12/01/2001    Years since quitting: 16.9  . Smokeless tobacco: Never Used  Substance and Sexual Activity  . Alcohol use: No  . Drug use: No  . Sexual activity: Not on file  Lifestyle  . Physical activity:    Days per week: Not on file    Minutes per session: Not on file  . Stress: Not on file  Relationships  . Social connections:    Talks on phone: Not on file    Gets together: Not on file    Attends religious service: Not on file    Active member of club or organization: Not on file    Attends meetings of clubs or organizations: Not on file    Relationship status: Not on file  . Intimate partner violence:    Fear of current or ex partner: Not on file    Emotionally abused: Not on file    Physically abused: Not on file    Forced sexual activity: Not on file  Other Topics Concern  . Not on file  Social History Narrative  . Not on file      Review of Systems  Respiratory: Positive for cough.   Skin: Positive for rash.  All other systems reviewed and are negative.      Objective:   Physical Exam  Cardiovascular: Normal rate, regular rhythm and normal heart sounds.  Pulmonary/Chest: No respiratory distress. He has  decreased breath sounds. He has no wheezes. He has no rhonchi. He has no rales.  Abdominal: Soft. Bowel sounds are normal. He exhibits no distension. There is no abdominal tenderness. There is no rebound and no guarding.  Genitourinary: Prostate is enlarged. Prostate is not tender.  Musculoskeletal:        General: No edema.  Vitals reviewed.         Assessment & Plan:  Frequency of urination - Plan: Urinalysis, Routine w reflex microscopic  Polyuria - Plan: Hemoglobin A1c, COMPLETE METABOLIC PANEL WITH GFR, PSA, Urinalysis, Routine w reflex microscopic  Other atopic dermatitis  I believe his rash is likely atopic dermatitis coupled with dry skin.  I recommended using moisturizers every day such as Sarna or Eucerin and then when the rash flares up he can use Elocon cream applied once a day for 10 to 14 days to calm the rash down.  Need to rule out uncontrolled diabetes as a source of his urinary frequency.  I will also check a PSA to exclude the possibility of prostate cancer.  I suspect this is a combination of BPH possibly with overactive bladder.  If his lab work is relatively normal we may need to try combination of Flomax as well as Vesicare.

## 2018-11-11 NOTE — Addendum Note (Signed)
Addended by: Sheral Flow on: 11/11/2018 04:53 PM   Modules accepted: Orders

## 2018-11-12 LAB — COMPLETE METABOLIC PANEL WITH GFR
AG Ratio: 1.8 (calc) (ref 1.0–2.5)
ALBUMIN MSPROF: 4.4 g/dL (ref 3.6–5.1)
ALKALINE PHOSPHATASE (APISO): 56 U/L (ref 40–115)
ALT: 24 U/L (ref 9–46)
AST: 24 U/L (ref 10–35)
BUN: 16 mg/dL (ref 7–25)
CHLORIDE: 101 mmol/L (ref 98–110)
CO2: 29 mmol/L (ref 20–32)
Calcium: 10.1 mg/dL (ref 8.6–10.3)
Creat: 0.87 mg/dL (ref 0.70–1.18)
GFR, Est African American: 95 mL/min/{1.73_m2} (ref >=60)
GFR, Est Non African American: 82 mL/min/{1.73_m2} (ref >=60)
GLOBULIN: 2.5 g/dL (ref 1.9–3.7)
Glucose, Bld: 89 mg/dL (ref 65–99)
Potassium: 4.6 mmol/L (ref 3.5–5.3)
SODIUM: 140 mmol/L (ref 135–146)
Total Bilirubin: 0.5 mg/dL (ref 0.2–1.2)
Total Protein: 6.9 g/dL (ref 6.1–8.1)

## 2018-11-12 LAB — HEMOGLOBIN A1C

## 2018-11-12 LAB — PSA: PSA: 1.2 ng/mL (ref <=4.0)

## 2018-11-15 ENCOUNTER — Other Ambulatory Visit: Payer: Self-pay | Admitting: Family Medicine

## 2018-11-15 MED ORDER — TAMSULOSIN HCL 0.4 MG PO CAPS: 0.4000 mg | ORAL_CAPSULE | Freq: Every day | ORAL | 3 refills | Status: DC

## 2018-12-07 ENCOUNTER — Ambulatory Visit: Payer: Medicare Other | Admitting: Family Medicine

## 2018-12-10 ENCOUNTER — Encounter: Payer: Self-pay | Admitting: Family Medicine

## 2018-12-10 ENCOUNTER — Ambulatory Visit (INDEPENDENT_AMBULATORY_CARE_PROVIDER_SITE_OTHER): Payer: Medicare Other | Admitting: Family Medicine

## 2018-12-10 VITALS — BP 130/78 | HR 66 | Temp 97.8°F | Resp 20 | Ht 65.0 in | Wt 150.0 lb

## 2018-12-10 DIAGNOSIS — N401 Enlarged prostate with lower urinary tract symptoms: Secondary | ICD-10-CM

## 2018-12-10 NOTE — Progress Notes (Signed)
Subjective:    Patient ID: Michael Evans, male    DOB: Dec 08, 1940, 78 y.o.   MRN: [id number] 11/11/18 Patient has not been seen in over a year.  He presents today with 2 concerns.  #1 he has a rash that occurs on both legs and both arms.  It is characterized by small erythematous papules usually on the lateral aspects of both shins as well as erythematous papules and itchy skin on the dorsal surface of both forearms.  The rash will come and go.  Today there is no visible rash seen.  However he states that it itches terribly when it is there.  He will scratch so hard occasionally will make him bleed.  Inspection of his lateral shin today reveals linear excoriations and a few isolated erythematous papules.  There is no visible rash seen on his forearms.  The rash is located nowhere else on his body.  Second concern is increased urinary frequency.  He states that he has to go to the bathroom to urinate quite often.  He will wake up sometimes 4-5 times every night.  He does report occasional weak stream and dribbling stream.  He denies any dysuria, urgency.  Does occasionally have hesitancy.  Past medical history significant for diabetes.  His hemoglobin A1c has not been checked in more than a year.  At that time it was 7.1..  He denies any polydipsia or blurry vision or weight loss.  Prostate exam was performed today and prostate was slightly enlarged but symmetric and soft without nodularity.  At that time, my plan was: I believe his rash is likely atopic dermatitis coupled with dry skin.  I recommended using moisturizers every day such as Sarna or Eucerin and then when the rash flares up he can use Elocon cream applied once a day for 10 to 14 days to calm the rash down.  Need to rule out uncontrolled diabetes as a source of his urinary frequency.  I will also check a PSA to exclude the possibility of prostate cancer.  I suspect this is a combination of BPH possibly with overactive bladder.  If his lab work is  relatively normal we may need to try combination of Flomax as well as Vesicare.  12/10/18 Office Visit on 11/11/2018  Component Date Value Ref Range Status  . Hgb A1c MFr Bld 11/11/2018 CANCELED   Final   Comment: TEST NOT PERFORMED . No lavender-top tube received.  Result canceled by the ancillary.   . Glucose, Bld 11/11/2018 89  65 - 99 mg/dL Final   Comment: .            Fasting reference interval .   . BUN 11/11/2018 16  7 - 25 mg/dL Final  . Creat 11/11/2018 0.87  0.70 - 1.18 mg/dL Final   Comment: For patients >42 years of age, the reference limit for Creatinine is approximately 13% higher for people identified as African-American. .   . GFR, Est Non African American 11/11/2018 82  > OR = 60 mL/min/1.9m2 Final  . GFR, Est African American 11/11/2018 95  > OR = 60 mL/min/1.95m2 Final  . BUN/Creatinine Ratio 29/92/4268 NOT APPLICABLE  6 - 22 (calc) Final  . Sodium 11/11/2018 140  135 - 146 mmol/L Final  . Potassium 11/11/2018 4.6  3.5 - 5.3 mmol/L Final  . Chloride 11/11/2018 101  98 - 110 mmol/L Final  . CO2 11/11/2018 29  20 - 32 mmol/L Final  . Calcium 11/11/2018 10.1  8.6 - 10.3 mg/dL Final  . Total Protein 11/11/2018 6.9  6.1 - 8.1 g/dL Final  . Albumin 11/11/2018 4.4  3.6 - 5.1 g/dL Final  . Globulin 11/11/2018 2.5  1.9 - 3.7 g/dL (calc) Final  . AG Ratio 11/11/2018 1.8  1.0 - 2.5 (calc) Final  . Total Bilirubin 11/11/2018 0.5  0.2 - 1.2 mg/dL Final  . Alkaline phosphatase (APISO) 11/11/2018 56  40 - 115 U/L Final  . AST 11/11/2018 24  10 - 35 U/L Final  . ALT 11/11/2018 24  9 - 46 U/L Final  . PSA 11/11/2018 1.2  < OR = 4.0 ng/mL Final   Comment: The total PSA value from this assay system is  standardized against the WHO standard. The test  result will be approximately 20% lower when compared  to the equimolar-standardized total PSA (Beckman  Coulter). Comparison of serial PSA results should be  interpreted with this fact in mind. . This test was performed  using the Siemens  chemiluminescent method. Values obtained from  different assay methods cannot be used interchangeably. PSA levels, regardless of value, should not be interpreted as absolute evidence of the presence or absence of disease.   . Color, Urine 11/11/2018 YELLOW  YELLOW Final  . APPearance 11/11/2018 CLEAR  CLEAR Final  . Specific Gravity, Urine 11/11/2018 1.015  1.001 - 1.03 Final  . pH 11/11/2018 6.5  5.0 - 8.0 Final  . Glucose, UA 11/11/2018 NEGATIVE  NEGATIVE Final  . Bilirubin Urine 11/11/2018 NEGATIVE  NEGATIVE Final  . Ketones, ur 11/11/2018 NEGATIVE  NEGATIVE Final  . Hgb urine dipstick 11/11/2018 NEGATIVE  NEGATIVE Final  . Protein, ur 11/11/2018 NEGATIVE  NEGATIVE Final  . Nitrite 11/11/2018 NEGATIVE  NEGATIVE Final  . Leukocytes, UA 11/11/2018 NEGATIVE  NEGATIVE Final   Lab work was relatively unremarkable.  Therefore we recommended a trial of Flomax 0.4 mg p.o. nightly for BPH with lower urinary tract symptoms.  Patient states that the medication is helping.  He says his nocturia has decreased.  He is emptying his bladder more completely.  He denies any dysuria or urgency.  He hesitancy.  The frequency has decreased and he feels more complete bladder evacuation.  He denies any orthostatic dizziness.  He continues to have occasional cough and occasional shortness of breath with exertion despite taking Symbicort.  We discussed switching to Trelegy today but the patient states he feels he is doing well enough on his current long-acting bronchodilator therapy Past Medical History:  Diagnosis Date  . Arthritis   . Chronic rhinitis   . COPD (chronic obstructive pulmonary disease) (HCC)    PFT 06-26-09 FEV1  1.75( 71%), FVC 3.65( 101%), FEV1% 48, TLC 5.53(104%), DLCO 55%, no BD  . Hypertension   . Nasal septal deviation   . Nocturia   . On home O2    2L N/C  . OSA (obstructive sleep apnea) 04/21/2011  . Skin cancer    Past Surgical History:  Procedure Laterality  Date  . APPENDECTOMY    . BACK SURGERY    . CATARACT EXTRACTION W/PHACO  01/06/2013   Procedure: CATARACT EXTRACTION PHACO AND INTRAOCULAR LENS PLACEMENT (IOC);  Surgeon: Tonny Branch, MD;  Location: [facility];  Service: Ophthalmology;  Laterality: Right;  CDE: 22.17   Current Outpatient Medications on File Prior to Visit  Medication Sig Dispense Refill  . albuterol (PROVENTIL HFA;VENTOLIN HFA) 108 (90 Base) MCG/ACT inhaler Inhale 1-2 puffs into the lungs every 6 (six) hours as needed  for wheezing or shortness of breath. 1 Inhaler 3  . albuterol (PROVENTIL) (2.5 MG/3ML) 0.083% nebulizer solution Take 3 mLs (2.5 mg total) by nebulization every 4 (four) hours. And as needed for shortness of breath. Dx: J44.9 150 mL 11  . budesonide-formoterol (SYMBICORT) 160-4.5 MCG/ACT inhaler Inhale 2 puffs into the lungs 2 (two) times daily. 10.2 Inhaler 5  . cyanocobalamin 2000 MCG tablet Take 2,000 mcg by mouth daily.    Marland Kitchen guaiFENesin (MUCINEX) 600 MG 12 hr tablet Take 1 tablet (600 mg total) by mouth 2 (two) times daily. 30 tablet 0  . hydrALAZINE (APRESOLINE) 50 MG tablet Take 1 tablet (50 mg total) by mouth 2 (two) times daily with a meal. 60 tablet 0  . mometasone (ELOCON) 0.1 % cream Apply 1 application topically daily. 45 g 1  . Multiple Vitamin (MULTIVITAMIN) tablet Take 1 tablet by mouth daily.    . Omega-3 Fatty Acids (FISH OIL CONCENTRATE PO) Take 1 capsule by mouth daily.    . OXYGEN Inhale 2 L into the lungs continuous.    Marland Kitchen pyridoxine (B-6) 100 MG tablet Take 100 mg by mouth daily.    . tamsulosin (FLOMAX) 0.4 MG CAPS capsule Take 1 capsule (0.4 mg total) by mouth at bedtime. 30 capsule 3   No current facility-administered medications on file prior to visit.    Allergies  Allergen Reactions  . Amlodipine Swelling    Swelling in feet  . Aspirin Other (See Comments)    Upset stomach.  . Other Hypertension    Hazelnuts  . Penicillins Hives    Has patient had a PCN reaction causing immediate  rash, facial/tongue/throat swelling, SOB or lightheadedness with hypotension: Yes Has patient had a PCN reaction causing severe rash involving mucus membranes or skin necrosis: No Has patient had a PCN reaction that required hospitalization No Has patient had a PCN reaction occurring within the last 10 years: No If all of the above answers are "NO", then may proceed with Cephalosporin use.   . Sulfa Antibiotics Hives   Social History   Socioeconomic History  . Marital status: Divorced    Spouse name: Not on file  . Number of children: Not on file  . Years of education: Not on file  . Highest education level: Not on file  Occupational History  . Occupation: Neurosurgeon: RETIRED  Social Needs  . Financial resource strain: Not on file  . Food insecurity:    Worry: Not on file    Inability: Not on file  . Transportation needs:    Medical: Not on file    Non-medical: Not on file  Tobacco Use  . Smoking status: Former Smoker    Packs/day: 1.50    Years: 54.00    Pack years: 81.00    Types: Cigarettes    Last attempt to quit: 12/01/2001    Years since quitting: 17.0  . Smokeless tobacco: Never Used  Substance and Sexual Activity  . Alcohol use: No  . Drug use: No  . Sexual activity: Not on file  Lifestyle  . Physical activity:    Days per week: Not on file    Minutes per session: Not on file  . Stress: Not on file  Relationships  . Social connections:    Talks on phone: Not on file    Gets together: Not on file    Attends religious service: Not on file    Active member of club or organization: Not  on file    Attends meetings of clubs or organizations: Not on file    Relationship status: Not on file  . Intimate partner violence:    Fear of current or ex partner: Not on file    Emotionally abused: Not on file    Physically abused: Not on file    Forced sexual activity: Not on file  Other Topics Concern  . Not on file  Social History Narrative  . Not on  file      Review of Systems  Respiratory: Positive for cough.   Skin: Positive for rash.  All other systems reviewed and are negative.      Objective:   Physical Exam  Cardiovascular: Normal rate, regular rhythm and normal heart sounds.  Pulmonary/Chest: No respiratory distress. He has decreased breath sounds. He has no wheezes. He has no rhonchi. He has no rales.  Abdominal: Soft. Bowel sounds are normal. He exhibits no distension. There is no abdominal tenderness. There is no rebound and no guarding.  Musculoskeletal:        General: No edema.  Vitals reviewed.         Assessment & Plan:  BPH with lower urinary tract symptoms-  Symptoms have improved dramatically on Flomax.  We will continue Flomax at the present dose 0.4 mg p.o. nightly.  Follow-up in 6 months or as needed.  Patient has already had his flu shot.  He denies any other complaints today.  Blood pressures well controlled

## 2019-02-04 ENCOUNTER — Encounter: Payer: Self-pay | Admitting: Family Medicine

## 2019-02-04 ENCOUNTER — Ambulatory Visit (INDEPENDENT_AMBULATORY_CARE_PROVIDER_SITE_OTHER): Payer: Medicare Other | Admitting: Family Medicine

## 2019-02-04 ENCOUNTER — Other Ambulatory Visit: Payer: Self-pay | Admitting: Family Medicine

## 2019-02-04 VITALS — BP 126/74 | HR 70 | Temp 98.2°F | Resp 20 | Ht 65.0 in | Wt 156.0 lb

## 2019-02-04 DIAGNOSIS — J441 Chronic obstructive pulmonary disease with (acute) exacerbation: Secondary | ICD-10-CM

## 2019-02-04 MED ORDER — MOXIFLOXACIN HCL 400 MG PO TABS: 400.0000 mg | ORAL_TABLET | Freq: Every day | ORAL | 0 refills | Status: DC

## 2019-02-04 MED ORDER — PREDNISONE 20 MG PO TABS
60.0000 mg | ORAL_TABLET | Freq: Every day | ORAL | 0 refills | Status: DC
Start: 2019-02-04 — End: 2020-01-05

## 2019-02-04 NOTE — Telephone Encounter (Signed)
Received following message from pharmacy:  Alternative Requested:[facility] WONT PAY FOR MOXIFLOXACIN - THEY WILL COVER LEVOFLOXACIN.  MD please advise.

## 2019-02-04 NOTE — Progress Notes (Signed)
Subjective:    Patient ID: TANIS BURNLEY, male    DOB: [birth year], 78 y.o.   MRN: [id number]  Cough   Patient has a history of COPD with frequent COPD exacerbations.  4 days ago he developed a cough.  Over the last 3 days has become productive of green thick sputum.  He reports increasing shortness of breath.  He is still taking his Symbicort however today he had to start using his albuterol nebulizer treatment.  Previously he had just been using it at night.  He just had a nebulizer treatment prior to coming here.  Is receiving his nebulizer treatment, his breathing has improved.  However on physical exam he has diminished breath sounds throughout with diffuse expiratory wheezes.  He also has rhonchorous breath sounds in all lung fields.  He denies any chest pain.  He denies any pleurisy.  He denies any fevers or chills.  Oxygen is 97% on oxygen. Past Medical History:  Diagnosis Date  . Arthritis   . Chronic rhinitis   . COPD (chronic obstructive pulmonary disease) (HCC)    PFT 06-26-09 FEV1  1.75( 71%), FVC 3.65( 101%), FEV1% 48, TLC 5.53(104%), DLCO 55%, no BD  . Hypertension   . Nasal septal deviation   . Nocturia   . On home O2    2L N/C  . OSA (obstructive sleep apnea) 04/21/2011  . Skin cancer    Past Surgical History:  Procedure Laterality Date  . APPENDECTOMY    . BACK SURGERY    . CATARACT EXTRACTION W/PHACO  01/06/2013   Procedure: CATARACT EXTRACTION PHACO AND INTRAOCULAR LENS PLACEMENT (IOC);  Surgeon: Tonny Branch, MD;  Location: [facility];  Service: Ophthalmology;  Laterality: Right;  CDE: 22.17   Current Outpatient Medications on File Prior to Visit  Medication Sig Dispense Refill  . albuterol (PROVENTIL HFA;VENTOLIN HFA) 108 (90 Base) MCG/ACT inhaler Inhale 1-2 puffs into the lungs every 6 (six) hours as needed for wheezing or shortness of breath. 1 Inhaler 3  . albuterol (PROVENTIL) (2.5 MG/3ML) 0.083% nebulizer solution Take 3 mLs (2.5 mg total) by nebulization every 4  (four) hours. And as needed for shortness of breath. Dx: J44.9 150 mL 11  . budesonide-formoterol (SYMBICORT) 160-4.5 MCG/ACT inhaler Inhale 2 puffs into the lungs 2 (two) times daily. 10.2 Inhaler 5  . cyanocobalamin 2000 MCG tablet Take 2,000 mcg by mouth daily.    Marland Kitchen guaiFENesin (MUCINEX) 600 MG 12 hr tablet Take 1 tablet (600 mg total) by mouth 2 (two) times daily. 30 tablet 0  . Multiple Vitamin (MULTIVITAMIN) tablet Take 1 tablet by mouth daily.    . Omega-3 Fatty Acids (FISH OIL CONCENTRATE PO) Take 1 capsule by mouth daily.    . OXYGEN Inhale 2 L into the lungs continuous.    Marland Kitchen pyridoxine (B-6) 100 MG tablet Take 100 mg by mouth daily.    . tamsulosin (FLOMAX) 0.4 MG CAPS capsule Take 1 capsule (0.4 mg total) by mouth at bedtime. 30 capsule 3   No current facility-administered medications on file prior to visit.    Allergies  Allergen Reactions  . Amlodipine Swelling    Swelling in feet  . Aspirin Other (See Comments)    Upset stomach.  . Other Hypertension    Hazelnuts  . Penicillins Hives    Has patient had a PCN reaction causing immediate rash, facial/tongue/throat swelling, SOB or lightheadedness with hypotension: Yes Has patient had a PCN reaction causing severe rash involving mucus membranes or  skin necrosis: No Has patient had a PCN reaction that required hospitalization No Has patient had a PCN reaction occurring within the last 10 years: No If all of the above answers are "NO", then may proceed with Cephalosporin use.   . Sulfa Antibiotics Hives   Social History   Socioeconomic History  . Marital status: Divorced    Spouse name: Not on file  . Number of children: Not on file  . Years of education: Not on file  . Highest education level: Not on file  Occupational History  . Occupation: Neurosurgeon: RETIRED  Social Needs  . Financial resource strain: Not on file  . Food insecurity:    Worry: Not on file    Inability: Not on file  . Transportation  needs:    Medical: Not on file    Non-medical: Not on file  Tobacco Use  . Smoking status: Former Smoker    Packs/day: 1.50    Years: 54.00    Pack years: 81.00    Types: Cigarettes    Last attempt to quit: 12/01/2001    Years since quitting: 17.1  . Smokeless tobacco: Never Used  Substance and Sexual Activity  . Alcohol use: No  . Drug use: No  . Sexual activity: Not on file  Lifestyle  . Physical activity:    Days per week: Not on file    Minutes per session: Not on file  . Stress: Not on file  Relationships  . Social connections:    Talks on phone: Not on file    Gets together: Not on file    Attends religious service: Not on file    Active member of club or organization: Not on file    Attends meetings of clubs or organizations: Not on file    Relationship status: Not on file  . Intimate partner violence:    Fear of current or ex partner: Not on file    Emotionally abused: Not on file    Physically abused: Not on file    Forced sexual activity: Not on file  Other Topics Concern  . Not on file  Social History Narrative  . Not on file      Review of Systems  Respiratory: Positive for cough.   All other systems reviewed and are negative.      Objective:   Physical Exam  Cardiovascular: Normal rate, regular rhythm and normal heart sounds.  Pulmonary/Chest: No respiratory distress. He has decreased breath sounds. He has wheezes. He has rhonchi. He has no rales.  Abdominal: Soft. Bowel sounds are normal. He exhibits no distension. There is no abdominal tenderness. There is no rebound and no guarding.  Musculoskeletal:        General: No edema.  Vitals reviewed.         Assessment & Plan:  COPD exacerbation ([place]) - Plan: moxifloxacin (AVELOX) 400 MG tablet, predniSONE (DELTASONE) 20 MG tablet  Patient received 80 mg of Depo-Medrol x1 now.  Begin prednisone 60 mg a day for the next week.  Start Avelox 400 mg p.o. daily for the next week given his increasing  and changing sputum consistency.  Use albuterol every 4-6 hours for wheezing.  Continue home oxygen.  If breathing worsens, he is instructed to go the emergency room

## 2019-02-07 ENCOUNTER — Other Ambulatory Visit: Payer: Self-pay | Admitting: Family Medicine

## 2019-02-07 ENCOUNTER — Ambulatory Visit: Payer: Medicare Other | Admitting: Family Medicine

## 2019-04-28 ENCOUNTER — Other Ambulatory Visit: Payer: Self-pay | Admitting: Family Medicine

## 2019-04-28 DIAGNOSIS — J441 Chronic obstructive pulmonary disease with (acute) exacerbation: Secondary | ICD-10-CM

## 2019-05-02 DIAGNOSIS — J449 Chronic obstructive pulmonary disease, unspecified: Secondary | ICD-10-CM | POA: Diagnosis not present

## 2019-06-01 DIAGNOSIS — J449 Chronic obstructive pulmonary disease, unspecified: Secondary | ICD-10-CM | POA: Diagnosis not present

## 2019-07-02 DIAGNOSIS — J449 Chronic obstructive pulmonary disease, unspecified: Secondary | ICD-10-CM | POA: Diagnosis not present

## 2019-08-02 DIAGNOSIS — J449 Chronic obstructive pulmonary disease, unspecified: Secondary | ICD-10-CM | POA: Diagnosis not present

## 2019-08-23 ENCOUNTER — Other Ambulatory Visit: Payer: Self-pay

## 2019-08-23 ENCOUNTER — Ambulatory Visit (INDEPENDENT_AMBULATORY_CARE_PROVIDER_SITE_OTHER): Payer: Medicare HMO

## 2019-08-23 DIAGNOSIS — J439 Emphysema, unspecified: Secondary | ICD-10-CM

## 2019-08-23 DIAGNOSIS — Z23 Encounter for immunization: Secondary | ICD-10-CM | POA: Diagnosis not present

## 2019-08-23 MED ORDER — ALBUTEROL SULFATE (2.5 MG/3ML) 0.083% IN NEBU: 2.5000 mg | INHALATION_SOLUTION | RESPIRATORY_TRACT | 11 refills | Status: DC

## 2019-09-01 DIAGNOSIS — J449 Chronic obstructive pulmonary disease, unspecified: Secondary | ICD-10-CM | POA: Diagnosis not present

## 2019-10-02 DIAGNOSIS — J449 Chronic obstructive pulmonary disease, unspecified: Secondary | ICD-10-CM | POA: Diagnosis not present

## 2019-11-01 DIAGNOSIS — J449 Chronic obstructive pulmonary disease, unspecified: Secondary | ICD-10-CM | POA: Diagnosis not present

## 2019-11-07 ENCOUNTER — Other Ambulatory Visit: Payer: Self-pay | Admitting: Family Medicine

## 2019-11-07 DIAGNOSIS — J439 Emphysema, unspecified: Secondary | ICD-10-CM

## 2019-12-02 DIAGNOSIS — J449 Chronic obstructive pulmonary disease, unspecified: Secondary | ICD-10-CM | POA: Diagnosis not present

## 2020-01-02 DIAGNOSIS — J449 Chronic obstructive pulmonary disease, unspecified: Secondary | ICD-10-CM | POA: Diagnosis not present

## 2020-01-05 ENCOUNTER — Other Ambulatory Visit: Payer: Self-pay

## 2020-01-05 ENCOUNTER — Encounter: Payer: Self-pay | Admitting: Family Medicine

## 2020-01-05 ENCOUNTER — Ambulatory Visit (INDEPENDENT_AMBULATORY_CARE_PROVIDER_SITE_OTHER): Payer: Medicare HMO | Admitting: Family Medicine

## 2020-01-05 VITALS — BP 132/82 | HR 74 | Temp 97.8°F | Resp 20 | Ht 65.0 in | Wt 150.0 lb

## 2020-01-05 DIAGNOSIS — L989 Disorder of the skin and subcutaneous tissue, unspecified: Secondary | ICD-10-CM

## 2020-01-05 DIAGNOSIS — D485 Neoplasm of uncertain behavior of skin: Secondary | ICD-10-CM | POA: Diagnosis not present

## 2020-01-05 DIAGNOSIS — L570 Actinic keratosis: Secondary | ICD-10-CM | POA: Diagnosis not present

## 2020-01-05 NOTE — Progress Notes (Signed)
Subjective:    Patient ID: Michael Evans, male    DOB: Apr 04, 1941, 79 y.o.   MRN: [id number]  HPI  Patient has a lesion on his right temple.  It is a pearly round pink papule with telangiectasias along the surface.  It is roughly 5 cm in diameter.  Patient states that has been there for 6 months.  He also has a skin tag hanging off his left lower eyelid.  He would like this removed today as well.  The lesion on his right forehead is concerning for a basal cell carcinoma. Past Medical History:  Diagnosis Date  . Arthritis   . Chronic rhinitis   . COPD (chronic obstructive pulmonary disease) (HCC)    PFT 06-26-09 FEV1  1.75( 71%), FVC 3.65( 101%), FEV1% 48, TLC 5.53(104%), DLCO 55%, no BD  . Hypertension   . Nasal septal deviation   . Nocturia   . On home O2    2L N/C  . OSA (obstructive sleep apnea) 04/21/2011  . Skin cancer    Past Surgical History:  Procedure Laterality Date  . APPENDECTOMY    . BACK SURGERY    . CATARACT EXTRACTION W/PHACO  01/06/2013   Procedure: CATARACT EXTRACTION PHACO AND INTRAOCULAR LENS PLACEMENT (IOC);  Surgeon: Tonny Branch, MD;  Location: [facility];  Service: Ophthalmology;  Laterality: Right;  CDE: 22.17   Current Outpatient Medications on File Prior to Visit  Medication Sig Dispense Refill  . albuterol (PROVENTIL HFA;VENTOLIN HFA) 108 (90 Base) MCG/ACT inhaler Inhale 1-2 puffs into the lungs every 6 (six) hours as needed for wheezing or shortness of breath. 1 Inhaler 3  . albuterol (PROVENTIL) (2.5 MG/3ML) 0.083% nebulizer solution TAKE 3 MLS BY NEBULIZATION EVERY 4 HOURS. AND AS NEEDED FOR SHORTNESS OF BREATH. DX: J44.9 150 mL 11  . cyanocobalamin 2000 MCG tablet Take 2,000 mcg by mouth daily.    Marland Kitchen guaiFENesin (MUCINEX) 600 MG 12 hr tablet Take 1 tablet (600 mg total) by mouth 2 (two) times daily. 30 tablet 0  . Multiple Vitamin (MULTIVITAMIN) tablet Take 1 tablet by mouth daily.    . Omega-3 Fatty Acids (FISH OIL CONCENTRATE PO) Take 1 capsule by mouth  daily.    . OXYGEN Inhale 2 L into the lungs continuous.    Marland Kitchen pyridoxine (B-6) 100 MG tablet Take 100 mg by mouth daily.    . SYMBICORT 160-4.5 MCG/ACT inhaler TAKE 2 PUFFS BY MOUTH TWICE A DAY 30.6 Inhaler 5   No current facility-administered medications on file prior to visit.   Allergies  Allergen Reactions  . Amlodipine Swelling    Swelling in feet  . Aspirin Other (See Comments)    Upset stomach.  . Other Hypertension    Hazelnuts  . Penicillins Hives    Has patient had a PCN reaction causing immediate rash, facial/tongue/throat swelling, SOB or lightheadedness with hypotension: Yes Has patient had a PCN reaction causing severe rash involving mucus membranes or skin necrosis: No Has patient had a PCN reaction that required hospitalization No Has patient had a PCN reaction occurring within the last 10 years: No If all of the above answers are "NO", then may proceed with Cephalosporin use.   . Sulfa Antibiotics Hives   Social History   Socioeconomic History  . Marital status: Divorced    Spouse name: Not on file  . Number of children: Not on file  . Years of education: Not on file  . Highest education level: Not on file  Occupational History  . Occupation: Neurosurgeon: RETIRED  Tobacco Use  . Smoking status: Former Smoker    Packs/day: 1.50    Years: 54.00    Pack years: 81.00    Types: Cigarettes    Quit date: 12/01/2001    Years since quitting: 18.1  . Smokeless tobacco: Never Used  Substance and Sexual Activity  . Alcohol use: No  . Drug use: No  . Sexual activity: Not on file  Other Topics Concern  . Not on file  Social History Narrative  . Not on file   Social Determinants of Health   Financial Resource Strain:   . Difficulty of Paying Living Expenses: Not on file  Food Insecurity:   . Worried About Charity fundraiser in the Last Year: Not on file  . Ran Out of Food in the Last Year: Not on file  Transportation Needs:   . Lack of  Transportation (Medical): Not on file  . Lack of Transportation (Non-Medical): Not on file  Physical Activity:   . Days of Exercise per Week: Not on file  . Minutes of Exercise per Session: Not on file  Stress:   . Feeling of Stress : Not on file  Social Connections:   . Frequency of Communication with Friends and Family: Not on file  . Frequency of Social Gatherings with Friends and Family: Not on file  . Attends Religious Services: Not on file  . Active Member of Clubs or Organizations: Not on file  . Attends Archivist Meetings: Not on file  . Marital Status: Not on file  Intimate Partner Violence:   . Fear of Current or Ex-Partner: Not on file  . Emotionally Abused: Not on file  . Physically Abused: Not on file  . Sexually Abused: Not on file     Review of Systems  All other systems reviewed and are negative.      Objective:   Physical Exam Vitals reviewed.  Constitutional:      Appearance: Normal appearance.  HENT:     Head:   Cardiovascular:     Rate and Rhythm: Normal rate and regular rhythm.  Pulmonary:     Effort: Pulmonary effort is normal.     Breath sounds: Normal breath sounds.  Neurological:     Mental Status: He is alert.           Assessment & Plan:  Lesion of skin of face - Plan: Pathology Report ([facility])  Lesion on the right forehead was anesthetized with 0.1% lidocaine with epinephrine.  It was removed using a shave biopsy technique with sterile technique.  Hemostasis was achieved with Drysol and a Band-Aid.  Lesion was sent to pathology in a labeled container.  The skin tag was removed from his left lower eyelid using a pair of iris scissors.

## 2020-01-09 LAB — TISSUE SPECIMEN

## 2020-01-09 LAB — PATHOLOGY REPORT

## 2020-01-24 ENCOUNTER — Ambulatory Visit (INDEPENDENT_AMBULATORY_CARE_PROVIDER_SITE_OTHER): Payer: Medicare HMO | Admitting: Family Medicine

## 2020-01-24 ENCOUNTER — Other Ambulatory Visit: Payer: Self-pay

## 2020-01-24 VITALS — BP 126/70 | HR 68 | Temp 96.8°F | Resp 20 | Ht 65.0 in | Wt 149.0 lb

## 2020-01-24 DIAGNOSIS — L570 Actinic keratosis: Secondary | ICD-10-CM | POA: Diagnosis not present

## 2020-01-24 NOTE — Progress Notes (Signed)
Subjective:    Patient ID: Michael Evans, male    DOB: 1941-03-09, 79 y.o.   MRN: [id number]  HPI  Patient has 2 lesions that have concern.  One lesion is just above his left eyebrow.  Is a 2 mm hard pink scaly papule.  Patient states that has been growing over the last month or so.  It is not bleeding.  It does not hurt.  It does not itch.  The 2nd lesion is inside his right earlobe.  It is approximately 2 mm.  It is an erythematous hard scaly papule.  It has poorly circumscribed borders.  I suspect that it might be a small SK vs AK.  I cannot rule out squamous cell carcinoma without biopsy.  Patient is here today to discuss treatment options.  Area would not be amenable to biopsy however we did discuss cryotherapy and the patient would like to try cryotherapy on both lesions given how small they are. Past Medical History:  Diagnosis Date  . Arthritis   . Chronic rhinitis   . COPD (chronic obstructive pulmonary disease) (HCC)    PFT 06-26-09 FEV1  1.75( 71%), FVC 3.65( 101%), FEV1% 48, TLC 5.53(104%), DLCO 55%, no BD  . Hypertension   . Nasal septal deviation   . Nocturia   . On home O2    2L N/C  . OSA (obstructive sleep apnea) 04/21/2011  . Skin cancer    Past Surgical History:  Procedure Laterality Date  . APPENDECTOMY    . BACK SURGERY    . CATARACT EXTRACTION W/PHACO  01/06/2013   Procedure: CATARACT EXTRACTION PHACO AND INTRAOCULAR LENS PLACEMENT (IOC);  Surgeon: Tonny Branch, MD;  Location: [facility];  Service: Ophthalmology;  Laterality: Right;  CDE: 22.17   Current Outpatient Medications on File Prior to Visit  Medication Sig Dispense Refill  . albuterol (PROVENTIL HFA;VENTOLIN HFA) 108 (90 Base) MCG/ACT inhaler Inhale 1-2 puffs into the lungs every 6 (six) hours as needed for wheezing or shortness of breath. 1 Inhaler 3  . albuterol (PROVENTIL) (2.5 MG/3ML) 0.083% nebulizer solution TAKE 3 MLS BY NEBULIZATION EVERY 4 HOURS. AND AS NEEDED FOR SHORTNESS OF BREATH. DX: J44.9 150 mL 11   . cyanocobalamin 2000 MCG tablet Take 2,000 mcg by mouth daily.    Marland Kitchen guaiFENesin (MUCINEX) 600 MG 12 hr tablet Take 1 tablet (600 mg total) by mouth 2 (two) times daily. 30 tablet 0  . Multiple Vitamin (MULTIVITAMIN) tablet Take 1 tablet by mouth daily.    . Omega-3 Fatty Acids (FISH OIL CONCENTRATE PO) Take 1 capsule by mouth daily.    . OXYGEN Inhale 2 L into the lungs continuous.    Marland Kitchen pyridoxine (B-6) 100 MG tablet Take 100 mg by mouth daily.    . SYMBICORT 160-4.5 MCG/ACT inhaler TAKE 2 PUFFS BY MOUTH TWICE A DAY 30.6 Inhaler 5   No current facility-administered medications on file prior to visit.   Allergies  Allergen Reactions  . Amlodipine Swelling    Swelling in feet  . Aspirin Other (See Comments)    Upset stomach.  . Other Hypertension    Hazelnuts  . Penicillins Hives    Has patient had a PCN reaction causing immediate rash, facial/tongue/throat swelling, SOB or lightheadedness with hypotension: Yes Has patient had a PCN reaction causing severe rash involving mucus membranes or skin necrosis: No Has patient had a PCN reaction that required hospitalization No Has patient had a PCN reaction occurring within the last 10 years:  No If all of the above answers are "NO", then may proceed with Cephalosporin use.   . Sulfa Antibiotics Hives   Social History   Socioeconomic History  . Marital status: Divorced    Spouse name: Not on file  . Number of children: Not on file  . Years of education: Not on file  . Highest education level: Not on file  Occupational History  . Occupation: Neurosurgeon: RETIRED  Tobacco Use  . Smoking status: Former Smoker    Packs/day: 1.50    Years: 54.00    Pack years: 81.00    Types: Cigarettes    Quit date: 12/01/2001    Years since quitting: 18.1  . Smokeless tobacco: Never Used  Substance and Sexual Activity  . Alcohol use: No  . Drug use: No  . Sexual activity: Not on file  Other Topics Concern  . Not on file  Social  History Narrative  . Not on file   Social Determinants of Health   Financial Resource Strain:   . Difficulty of Paying Living Expenses: Not on file  Food Insecurity:   . Worried About Charity fundraiser in the Last Year: Not on file  . Ran Out of Food in the Last Year: Not on file  Transportation Needs:   . Lack of Transportation (Medical): Not on file  . Lack of Transportation (Non-Medical): Not on file  Physical Activity:   . Days of Exercise per Week: Not on file  . Minutes of Exercise per Session: Not on file  Stress:   . Feeling of Stress : Not on file  Social Connections:   . Frequency of Communication with Friends and Family: Not on file  . Frequency of Social Gatherings with Friends and Family: Not on file  . Attends Religious Services: Not on file  . Active Member of Clubs or Organizations: Not on file  . Attends Archivist Meetings: Not on file  . Marital Status: Not on file  Intimate Partner Violence:   . Fear of Current or Ex-Partner: Not on file  . Emotionally Abused: Not on file  . Physically Abused: Not on file  . Sexually Abused: Not on file     Review of Systems  All other systems reviewed and are negative.      Objective:   Physical Exam HENT:     Ears:   Eyes:            Assessment & Plan:  AK (actinic keratosis)  I suspect both of these lesions represent actinic keratoses versus SKs versus squamous cell carcinoma in situ.  Rather than biopsy of the lesion, we elected to treat with cryotherapy using liquid nitrogen for a total of 30 seconds in each location.  The patient tolerated the procedure well without complication.  Recheck in 2 to 4 weeks if lesions persist.

## 2020-01-30 DIAGNOSIS — J449 Chronic obstructive pulmonary disease, unspecified: Secondary | ICD-10-CM | POA: Diagnosis not present

## 2020-03-01 DIAGNOSIS — J449 Chronic obstructive pulmonary disease, unspecified: Secondary | ICD-10-CM | POA: Diagnosis not present

## 2020-03-31 DIAGNOSIS — J449 Chronic obstructive pulmonary disease, unspecified: Secondary | ICD-10-CM | POA: Diagnosis not present

## 2020-05-01 DIAGNOSIS — J449 Chronic obstructive pulmonary disease, unspecified: Secondary | ICD-10-CM | POA: Diagnosis not present

## 2020-05-31 DIAGNOSIS — J449 Chronic obstructive pulmonary disease, unspecified: Secondary | ICD-10-CM | POA: Diagnosis not present

## 2020-07-01 DIAGNOSIS — J449 Chronic obstructive pulmonary disease, unspecified: Secondary | ICD-10-CM | POA: Diagnosis not present

## 2020-08-01 DIAGNOSIS — J449 Chronic obstructive pulmonary disease, unspecified: Secondary | ICD-10-CM | POA: Diagnosis not present

## 2020-08-31 DIAGNOSIS — J449 Chronic obstructive pulmonary disease, unspecified: Secondary | ICD-10-CM | POA: Diagnosis not present

## 2020-10-01 DIAGNOSIS — J449 Chronic obstructive pulmonary disease, unspecified: Secondary | ICD-10-CM | POA: Diagnosis not present

## 2020-10-31 DIAGNOSIS — J449 Chronic obstructive pulmonary disease, unspecified: Secondary | ICD-10-CM | POA: Diagnosis not present

## 2020-11-23 ENCOUNTER — Encounter (HOSPITAL_COMMUNITY): Payer: Self-pay | Admitting: Emergency Medicine

## 2020-11-23 ENCOUNTER — Emergency Department (HOSPITAL_COMMUNITY): Payer: Medicare HMO

## 2020-11-23 ENCOUNTER — Other Ambulatory Visit: Payer: Self-pay

## 2020-11-23 ENCOUNTER — Emergency Department (HOSPITAL_COMMUNITY)
Admission: EM | Admit: 2020-11-23 | Discharge: 2020-11-23 | Disposition: A | Discharge status: Home / Self Care | Payer: Medicare HMO | Attending: Emergency Medicine | Admitting: Emergency Medicine

## 2020-11-23 DIAGNOSIS — R042 Hemoptysis: Secondary | ICD-10-CM | POA: Diagnosis not present

## 2020-11-23 DIAGNOSIS — Z87891 Personal history of nicotine dependence: Secondary | ICD-10-CM | POA: Diagnosis not present

## 2020-11-23 DIAGNOSIS — I2699 Other pulmonary embolism without acute cor pulmonale: Secondary | ICD-10-CM | POA: Diagnosis not present

## 2020-11-23 DIAGNOSIS — Z20822 Contact with and (suspected) exposure to covid-19: Secondary | ICD-10-CM | POA: Insufficient documentation

## 2020-11-23 DIAGNOSIS — J449 Chronic obstructive pulmonary disease, unspecified: Secondary | ICD-10-CM | POA: Insufficient documentation

## 2020-11-23 DIAGNOSIS — Z79899 Other long term (current) drug therapy: Secondary | ICD-10-CM | POA: Insufficient documentation

## 2020-11-23 DIAGNOSIS — I10 Essential (primary) hypertension: Secondary | ICD-10-CM | POA: Diagnosis not present

## 2020-11-23 DIAGNOSIS — R062 Wheezing: Secondary | ICD-10-CM | POA: Diagnosis not present

## 2020-11-23 DIAGNOSIS — R000 Tachycardia, unspecified: Secondary | ICD-10-CM | POA: Insufficient documentation

## 2020-11-23 DIAGNOSIS — R059 Cough, unspecified: Secondary | ICD-10-CM | POA: Diagnosis not present

## 2020-11-23 LAB — CBC WITH DIFFERENTIAL/PLATELET
Abs Immature Granulocytes: 0.09 10*3/uL — ABNORMAL HIGH (ref 0.00–0.07)
Basophils Absolute: 0 10*3/uL (ref 0.0–0.1)
Basophils Relative: 0 %
Eosinophils Absolute: 0.2 10*3/uL (ref 0.0–0.5)
Eosinophils Relative: 2 %
HCT: 46.5 % (ref 39.0–52.0)
Hemoglobin: 15.1 g/dL (ref 13.0–17.0)
Immature Granulocytes: 1 %
Lymphocytes Relative: 37 %
Lymphs Abs: 3.3 10*3/uL (ref 0.7–4.0)
MCH: 29.9 pg (ref 26.0–34.0)
MCHC: 32.5 g/dL (ref 30.0–36.0)
MCV: 92.1 fL (ref 80.0–100.0)
Monocytes Absolute: 0.8 10*3/uL (ref 0.1–1.0)
Monocytes Relative: 8 %
Neutro Abs: 4.6 10*3/uL (ref 1.7–7.7)
Neutrophils Relative %: 52 %
Platelets: 281 10*3/uL (ref 150–400)
RBC: 5.05 MIL/uL (ref 4.22–5.81)
RDW: 12.5 % (ref 11.5–15.5)
WBC: 8.9 10*3/uL (ref 4.0–10.5)
nRBC: 0 % (ref 0.0–0.2)

## 2020-11-23 LAB — BRAIN NATRIURETIC PEPTIDE: B Natriuretic Peptide: 66 pg/mL (ref 0.0–100.0)

## 2020-11-23 LAB — RESP PANEL BY RT-PCR (FLU A&B, COVID) ARPGX2
Influenza A by PCR: NEGATIVE
Influenza B by PCR: NEGATIVE
SARS Coronavirus 2 by RT PCR: NEGATIVE

## 2020-11-23 LAB — BASIC METABOLIC PANEL
Anion gap: 9 (ref 5–15)
BUN: 22 mg/dL (ref 8–23)
CO2: 28 mmol/L (ref 22–32)
Calcium: 9.9 mg/dL (ref 8.9–10.3)
Chloride: 100 mmol/L (ref 98–111)
Creatinine, Ser: 0.94 mg/dL (ref 0.61–1.24)
GFR, Estimated: >60 mL/min (ref >60)
Glucose, Bld: 151 mg/dL — ABNORMAL HIGH (ref 70–99)
Potassium: 4 mmol/L (ref 3.5–5.1)
Sodium: 137 mmol/L (ref 135–145)

## 2020-11-23 LAB — PROTIME-INR
INR: 0.9 (ref 0.8–1.2)
Prothrombin Time: 12.2 seconds (ref 11.4–15.2)

## 2020-11-23 MED ORDER — IOHEXOL 350 MG/ML SOLN
100.0000 mL | Freq: Once | INTRAVENOUS | Status: AC | PRN
  Administered 2020-11-23: 100 mL via INTRAVENOUS

## 2020-11-23 MED ORDER — AZITHROMYCIN 250 MG PO TABS: 250.0000 mg | ORAL_TABLET | Freq: Every day | ORAL | 0 refills | Status: DC

## 2020-11-23 NOTE — Discharge Instructions (Addendum)
You were seen in the emergency department for evaluation of cough with some blood in it.  You had blood work EKG chest x-ray and a CAT scan of your chest that did not show an obvious explanation for your symptoms.  We are treating you with antibiotics for possible bronchitis.  Please follow-up with your primary care doctor and return to the emergency department if any worsening or concerning symptoms.

## 2020-11-23 NOTE — ED Provider Notes (Signed)
[facility] EMERGENCY DEPARTMENT Provider Note   CSN: [id number] Arrival date & time: 11/23/20  0034     History Chief Complaint  Patient presents with  . Hematemesis    Michael Evans is a 79 y.o. male.  He has a history of COPD and is on 2 L of oxygen.  He has had a chronic cough for over a year productive of white sputum.  He said today he coughed up frank blood.  No increased shortness of breath.  No chest pain or fever.  He is Covid vaccinated.  Has not had his flu shot this year.  Also has some abdominal pain that is been going on for 9 months.  Worse after eating.  No vomiting diarrhea or urinary symptoms.  The history is provided by the patient.  Cough Cough characteristics:  Productive Sputum characteristics:  Bloody Severity:  Moderate Onset quality:  Sudden Progression:  Unchanged Chronicity:  New Smoker: no   Relieved by:  None tried Worsened by:  Nothing Ineffective treatments:  None tried Associated symptoms: no chest pain, no chills, no fever, no headaches, no rash, no rhinorrhea, no shortness of breath and no sore throat        Past Medical History:  Diagnosis Date  . Arthritis   . Chronic rhinitis   . COPD (chronic obstructive pulmonary disease) (HCC)    PFT 06-26-09 FEV1  1.75( 71%), FVC 3.65( 101%), FEV1% 48, TLC 5.53(104%), DLCO 55%, no BD  . Hypertension   . Nasal septal deviation   . Nocturia   . On home O2    2L N/C  . OSA (obstructive sleep apnea) 04/21/2011  . Skin cancer     Patient Active Problem List   Diagnosis Date Noted  . Tachycardia 09/26/2017  . Influenza A 01/24/2016  . Acute on chronic respiratory failure with hypoxia ([place]) 01/22/2016  . SIRS (systemic inflammatory response syndrome) ([place]) 01/22/2016  . Leukocytosis 01/22/2016  . Hyperglycemia 01/22/2016  . COPD exacerbation ([place]) 02/14/2015  . Shingles rash 08/26/2014  . Hypoxia 08/26/2014  . OSA (obstructive sleep apnea) 04/21/2011  . HOARSENESS 10/03/2009  . RHINITIS  08/02/2009  . COPD with emphysema ([place]) 07/05/2009  . Essential hypertension, benign 06/07/2009    Past Surgical History:  Procedure Laterality Date  . APPENDECTOMY    . BACK SURGERY    . CATARACT EXTRACTION W/PHACO  01/06/2013   Procedure: CATARACT EXTRACTION PHACO AND INTRAOCULAR LENS PLACEMENT (IOC);  Surgeon: Tonny Branch, MD;  Location: [facility];  Service: Ophthalmology;  Laterality: Right;  CDE: 22.17       Family History  Problem Relation Age of Onset  . Lung cancer Mother     Social History   Tobacco Use  . Smoking status: Former Smoker    Packs/day: 1.50    Years: 54.00    Pack years: 81.00    Types: Cigarettes    Quit date: 12/01/2001    Years since quitting: 18.9  . Smokeless tobacco: Never Used  Vaping Use  . Vaping Use: Never used  Substance Use Topics  . Alcohol use: No  . Drug use: No    Home Medications Prior to Admission medications   Medication Sig Start Date End Date Taking? Authorizing Provider  albuterol (PROVENTIL HFA;VENTOLIN HFA) 108 (90 Base) MCG/ACT inhaler Inhale 1-2 puffs into the lungs every 6 (six) hours as needed for wheezing or shortness of breath. 11/04/18   [name], MD  albuterol (PROVENTIL) (2.5 MG/3ML) 0.083% nebulizer solution  TAKE 3 MLS BY NEBULIZATION EVERY 4 HOURS. AND AS NEEDED FOR SHORTNESS OF BREATH. DX: J44.9 Patient taking differently: Take 2.5 mg by nebulization every 4 (four) hours as needed for shortness of breath. 11/07/19   [name], MD  cyanocobalamin 2000 MCG tablet Take 2,000 mcg by mouth daily.    [provider]  guaiFENesin (MUCINEX) 600 MG 12 hr tablet Take 1 tablet (600 mg total) by mouth 2 (two) times daily. 10/02/17   Kathie Dike, MD  Multiple Vitamin (MULTIVITAMIN) tablet Take 1 tablet by mouth daily.    [provider]  Omega-3 Fatty Acids (FISH OIL CONCENTRATE PO) Take 1 capsule by mouth daily.    [provider]  OXYGEN Inhale 2 L into the lungs continuous.     [provider]  pyridoxine (B-6) 100 MG tablet Take 100 mg by mouth daily.    [provider]  SYMBICORT 160-4.5 MCG/ACT inhaler TAKE 2 PUFFS BY MOUTH TWICE A DAY Patient taking differently: Inhale 2 puffs into the lungs in the morning and at bedtime. 04/28/19   [name], MD    Allergies    Amlodipine, Aspirin, Other, Penicillins, and Sulfa antibiotics  Review of Systems   Review of Systems  Constitutional: Negative for chills and fever.  HENT: Negative for rhinorrhea and sore throat.   Eyes: Negative for visual disturbance.  Respiratory: Positive for cough. Negative for shortness of breath.   Cardiovascular: Negative for chest pain.  Gastrointestinal: Positive for abdominal pain. Negative for diarrhea, nausea and vomiting.  Genitourinary: Negative for dysuria.  Musculoskeletal: Negative for neck pain.  Skin: Negative for rash.  Neurological: Negative for headaches.    Physical Exam Updated Vital Signs BP (!) 173/77   Pulse (!) 101   Temp 98.2 F (36.8 C)   Resp (!) 22   Ht 5\' 5"  (1.651 m)   Wt 68 kg   SpO2 95%   BMI 24.96 kg/m   Physical Exam Vitals and nursing note reviewed.  Constitutional:      General: He is not in acute distress.    Appearance: Normal appearance. He is well-developed and well-nourished.  HENT:     Head: Normocephalic and atraumatic.  Eyes:     Conjunctiva/sclera: Conjunctivae normal.  Cardiovascular:     Rate and Rhythm: Regular rhythm. Tachycardia present.     Heart sounds: No murmur heard.   Pulmonary:     Effort: Pulmonary effort is normal. No respiratory distress.     Breath sounds: Wheezing (few scattered) present.  Abdominal:     Palpations: Abdomen is soft.     Tenderness: There is no abdominal tenderness.  Musculoskeletal:        General: No edema. Normal range of motion.     Cervical back: Neck supple.     Right lower leg: No edema.     Left lower leg: No edema.  Skin:    General: Skin is warm and  dry.  Neurological:     General: No focal deficit present.     Mental Status: He is alert.  Psychiatric:        Mood and Affect: Mood and affect normal.     ED Results / Procedures / Treatments   Labs (all labs ordered are listed, but only abnormal results are displayed) Labs Reviewed  BASIC METABOLIC PANEL - Abnormal; Notable for the following components:      Result Value   Glucose, Bld 151 (*)    All other  components within normal limits  CBC WITH DIFFERENTIAL/PLATELET - Abnormal; Notable for the following components:   Abs Immature Granulocytes 0.09 (*)    All other components within normal limits  RESP PANEL BY RT-PCR (FLU A&B, COVID) ARPGX2  PROTIME-INR  BRAIN NATRIURETIC PEPTIDE    EKG EKG Interpretation  Date/Time:  Friday November 23 2020 09:37:10 EST Ventricular Rate:  101 PR Interval:    QRS Duration: 82 QT Interval:  317 QTC Calculation: 411 R Axis:   -33 Text Interpretation: Sinus tachycardia Ventricular premature complex Left axis deviation Low voltage, precordial leads Anteroseptal infarct, old Nonspecific T abnormalities, lateral leads No significant change since prior 10/18 Confirmed by Meridee Score [phone]) on 11/23/2020 9:46:07 AM   Radiology CT Angio Chest PE W/Cm &/Or Wo Cm  Result Date: 11/23/2020 CLINICAL DATA:  Hemoptysis EXAM: CT ANGIOGRAPHY CHEST WITH CONTRAST TECHNIQUE: Multidetector CT imaging of the chest was performed using the standard protocol during bolus administration of intravenous contrast. Multiplanar CT image reconstructions and MIPs were obtained to evaluate the vascular anatomy. CONTRAST:  OMNIPAQUE IOHEXOL 350 MG/ML SOLN COMPARISON:  Same day chest radiograph, CT chest, 01/26/2017 FINDINGS: Cardiovascular: Satisfactory opacification of the pulmonary arteries to the segmental level. No evidence of pulmonary embolism. Normal heart size. Scattered coronary artery calcifications. No pericardial effusion. Aortic atherosclerosis.  Mediastinum/Nodes: No enlarged mediastinal, hilar, or axillary lymph nodes. Thyroid gland, trachea, and esophagus demonstrate no significant findings. Lungs/Pleura: Severe emphysema. Diffuse bilateral bronchial wall thickening. Bandlike scarring of the bilateral lung bases, not significantly changed compared to prior examination. No pleural effusion or pneumothorax. Upper Abdomen: No acute abnormality. Musculoskeletal: No chest wall abnormality. No acute or significant osseous findings. Review of the MIP images confirms the above findings. IMPRESSION: 1. Negative examination for pulmonary embolism. 2. No specific CT findings to explain hemoptysis. Diffuse bilateral bronchial wall thickening, consistent with nonspecific infectious or inflammatory bronchitis. 3. Severe emphysema. 4. Coronary artery disease. Aortic Atherosclerosis (ICD10-I70.0) and Emphysema (ICD10-J43.9). Electronically Signed   By: Lauralyn Primes M.D.   On: 11/23/2020 12:18   DG Chest Port 1 View  Result Date: 11/23/2020 CLINICAL DATA:  Hemoptysis for 2 days EXAM: PORTABLE CHEST 1 VIEW COMPARISON:  09/26/2017 FINDINGS: Cardiac shadow is within normal limits. Aortic calcifications are seen. Lungs are well aerated bilaterally. No focal infiltrate is seen. Nodular density is noted over the right lung base which likely represents a nipple shadow. Repeat imaging with nipple markers is recommended. No other focal abnormality is seen. IMPRESSION: Likely nipple shadow over the right lung base. No other focal abnormality is noted. Electronically Signed   By: Alcide Clever M.D.   On: 11/23/2020 10:22    Procedures Procedures (including critical care time)  Medications Ordered in ED Medications  iohexol (OMNIPAQUE) 350 MG/ML injection 100 mL (100 mLs Intravenous Contrast Given 11/23/20 1140)    ED Course  I have reviewed the triage vital signs and the nursing notes.  Pertinent labs & imaging results that were available during my care of the  patient were reviewed by me and considered in my medical decision making (see chart for details).  Clinical Course as of 11/23/20 1706  Fri Nov 23, 2020  1025 Portable chest x-ray interpreted by me as no acute infiltrates. [MB]  1300 Patient has had no further bleeding since he has been here.  His labs have been fairly unremarkable.  CT does not show an obvious explanation for his hemoptysis.  We will start him on some antibiotics and treat as presumptive  bronchitis.  Reviewed with patient he is comfortable plan.  Return instructions discussed. [MB]    Clinical Course User Index [MB] Hayden Rasmussen, MD   MDM Rules/Calculators/A&P                         JALYN PLUMBER was evaluated in Emergency Department on 11/23/2020 for the symptoms described in the history of present illness. He was evaluated in the context of the global COVID-19 pandemic, which necessitated consideration that the patient might be at risk for infection with the SARS-CoV-2 virus that causes COVID-19. Institutional protocols and algorithms that pertain to the evaluation of patients at risk for COVID-19 are in a state of rapid change based on information released by regulatory bodies including the CDC and federal and state organizations. These policies and algorithms were followed during the patient's care in the ED.  This patient complains of hemoptysis; this involves an extensive number of treatment Options and is a complaint that carries with it a high risk of complications and Morbidity. The differential includes tumor, PE, infection, tuberculosis  I ordered, reviewed and interpreted labs, which included CBC with normal white count normal hemoglobin, chemistries normal other than mildly elevated glucose, normal coags, normal BNP, negative Covid and flu I ordered imaging studies which included chest x-ray and CT angio chest and I independently    visualized and interpreted imaging which showed no PE, does have some  bronchitic changes Previous records obtained and reviewed in epic, no recent visits  After the interventions stated above, I reevaluated the patient and found to be satting well on his home oxygen level.  He has had no further hemoptysis here.  Reviewed work-up with him and he is comfortable plan for treating as possible bronchitis with antibiotics and close follow-up with your PCP.  Return instructions discussed   Final Clinical Impression(s) / ED Diagnoses Final diagnoses:  Cough with hemoptysis    Rx / DC Orders ED Discharge Orders         Ordered    azithromycin (ZITHROMAX) 250 MG tablet  Daily        11/23/20 1303           Hayden Rasmussen, MD 11/23/20 1709

## 2020-11-23 NOTE — ED Triage Notes (Signed)
Pt c/o coughing up blood this morning. Pt is on 2L O2 at home for COPD.

## 2020-12-01 DIAGNOSIS — J449 Chronic obstructive pulmonary disease, unspecified: Secondary | ICD-10-CM | POA: Diagnosis not present

## 2020-12-07 ENCOUNTER — Ambulatory Visit (INDEPENDENT_AMBULATORY_CARE_PROVIDER_SITE_OTHER): Payer: Medicare HMO | Admitting: Family Medicine

## 2020-12-07 ENCOUNTER — Other Ambulatory Visit: Payer: Self-pay

## 2020-12-07 ENCOUNTER — Encounter: Payer: Self-pay | Admitting: Family Medicine

## 2020-12-07 VITALS — BP 176/94 | HR 70 | Temp 97.5°F | Ht 65.0 in | Wt 154.0 lb

## 2020-12-07 DIAGNOSIS — Z23 Encounter for immunization: Secondary | ICD-10-CM

## 2020-12-07 DIAGNOSIS — R042 Hemoptysis: Secondary | ICD-10-CM | POA: Diagnosis not present

## 2020-12-07 DIAGNOSIS — J439 Emphysema, unspecified: Secondary | ICD-10-CM

## 2020-12-07 MED ORDER — ALBUTEROL SULFATE (2.5 MG/3ML) 0.083% IN NEBU: INHALATION_SOLUTION | RESPIRATORY_TRACT | 11 refills | Status: DC

## 2020-12-07 MED ORDER — BUDESONIDE-FORMOTEROL FUMARATE 160-4.5 MCG/ACT IN AERO: INHALATION_SPRAY | RESPIRATORY_TRACT | 5 refills | Status: DC

## 2020-12-07 NOTE — Progress Notes (Signed)
Subjective:    Patient ID: Michael Evans, male    DOB: 03-22-1941, 80 y.o.   MRN: [id number]  Patient is a very pleasant 80 year old Caucasian male with a history of COPD on 2 L of oxygen chronically.  He was seen in the emergency room on December 24 after an episode of frank hemoptysis.  No explanation for his hemoptysis was seen on his CT scan.  The emergency room physician started him on Zithromax for possible episode of bronchitis.  He is here today for follow-up.  FINDINGS: Cardiovascular: Satisfactory opacification of the pulmonary arteries to the segmental level. No evidence of pulmonary embolism. Normal heart size. Scattered coronary artery calcifications. No pericardial effusion. Aortic atherosclerosis.  Mediastinum/Nodes: No enlarged mediastinal, hilar, or axillary lymph nodes. Thyroid gland, trachea, and esophagus demonstrate no significant findings.  Lungs/Pleura: Severe emphysema. Diffuse bilateral bronchial wall thickening. Bandlike scarring of the bilateral lung bases, not significantly changed compared to prior examination. No pleural effusion or pneumothorax.  Upper Abdomen: No acute abnormality.  I reviewed his labs from the emergency room including a CBC and a CMP that were normal.  Patient states that on Christmas Eve, he was driving his vehicle to go visit his family.  He felt fluid in his mouth and he went to spit and saw significant blood when he spit out of his car window.  This scared him so he went to the emergency room.  The following morning he coughed and spit up some more blood however he looked at his tongue and saw blood coming from a cut on the underside of his tongue.  Today I examined his tongue.  I put on a pair of gloves and gently pulled it from his mouth and I see no visible abnormality or trauma or lesion.  He states that it is healed.  He denies any additional hemoptysis since his ER visit.  He denies any chest pain or pleurisy.  He denies any  fevers or chills.  He does have his standard cough and standard shortness of breath but this is at his baseline Past Medical History:  Diagnosis Date  . Arthritis   . Chronic rhinitis   . COPD (chronic obstructive pulmonary disease) (HCC)    PFT 06-26-09 FEV1  1.75( 71%), FVC 3.65( 101%), FEV1% 48, TLC 5.53(104%), DLCO 55%, no BD  . Hypertension   . Nasal septal deviation   . Nocturia   . On home O2    2L N/C  . OSA (obstructive sleep apnea) 04/21/2011  . Skin cancer    Past Surgical History:  Procedure Laterality Date  . APPENDECTOMY    . BACK SURGERY    . CATARACT EXTRACTION W/PHACO  01/06/2013   Procedure: CATARACT EXTRACTION PHACO AND INTRAOCULAR LENS PLACEMENT ([facility]);  Surgeon: Tonny Branch, MD;  Location: [facility];  Service: Ophthalmology;  Laterality: Right;  CDE: 22.17   Current Outpatient Medications on File Prior to Visit  Medication Sig Dispense Refill  . albuterol (PROVENTIL HFA;VENTOLIN HFA) 108 (90 Base) MCG/ACT inhaler Inhale 1-2 puffs into the lungs every 6 (six) hours as needed for wheezing or shortness of breath. 1 Inhaler 3  . albuterol (PROVENTIL) (2.5 MG/3ML) 0.083% nebulizer solution TAKE 3 MLS BY NEBULIZATION EVERY 4 HOURS. AND AS NEEDED FOR SHORTNESS OF BREATH. DX: J44.9 (Patient taking differently: Take 2.5 mg by nebulization every 4 (four) hours as needed for shortness of breath.) 150 mL 11  . azithromycin (ZITHROMAX) 250 MG tablet Take 1 tablet (250 mg total)  by mouth daily. Take first 2 tablets together, then 1 every day until finished. 6 tablet 0  . cyanocobalamin 2000 MCG tablet Take 2,000 mcg by mouth daily.    Marland Kitchen guaiFENesin (MUCINEX) 600 MG 12 hr tablet Take 1 tablet (600 mg total) by mouth 2 (two) times daily. 30 tablet 0  . Multiple Vitamin (MULTIVITAMIN) tablet Take 1 tablet by mouth daily.    . Omega-3 Fatty Acids (FISH OIL CONCENTRATE PO) Take 1 capsule by mouth daily.    . OXYGEN Inhale 2 L into the lungs continuous.    Marland Kitchen pyridoxine (B-6) 100 MG tablet  Take 100 mg by mouth daily.    . SYMBICORT 160-4.5 MCG/ACT inhaler TAKE 2 PUFFS BY MOUTH TWICE A DAY (Patient taking differently: Inhale 2 puffs into the lungs in the morning and at bedtime.) 30.6 Inhaler 5   No current facility-administered medications on file prior to visit.   Allergies  Allergen Reactions  . Amlodipine Swelling    Swelling in feet  . Aspirin Other (See Comments)    Upset stomach.  . Other Hypertension    Hazelnuts  . Penicillins Hives    Has patient had a PCN reaction causing immediate rash, facial/tongue/throat swelling, SOB or lightheadedness with hypotension: Yes Has patient had a PCN reaction causing severe rash involving mucus membranes or skin necrosis: No Has patient had a PCN reaction that required hospitalization No Has patient had a PCN reaction occurring within the last 10 years: No If all of the above answers are "NO", then may proceed with Cephalosporin use.   . Sulfa Antibiotics Hives   Social History   Socioeconomic History  . Marital status: Divorced    Spouse name: Not on file  . Number of children: Not on file  . Years of education: Not on file  . Highest education level: Not on file  Occupational History  . Occupation: Armed forces technical officer: RETIRED  Tobacco Use  . Smoking status: Former Smoker    Packs/day: 1.50    Years: 54.00    Pack years: 81.00    Types: Cigarettes    Quit date: 12/01/2001    Years since quitting: 19.0  . Smokeless tobacco: Never Used  Vaping Use  . Vaping Use: Never used  Substance and Sexual Activity  . Alcohol use: No  . Drug use: No  . Sexual activity: Not on file  Other Topics Concern  . Not on file  Social History Narrative  . Not on file   Social Determinants of Health   Financial Resource Strain: Not on file  Food Insecurity: Not on file  Transportation Needs: Not on file  Physical Activity: Not on file  Stress: Not on file  Social Connections: Not on file  Intimate Partner Violence: Not  on file      Review of Systems  Respiratory: Positive for cough.   All other systems reviewed and are negative.      Objective:   Physical Exam Vitals reviewed.  HENT:     Nose: Nose normal.     Mouth/Throat:     Mouth: Mucous membranes are moist.     Pharynx: No oropharyngeal exudate or posterior oropharyngeal erythema.  Cardiovascular:     Rate and Rhythm: Normal rate and regular rhythm.     Heart sounds: Normal heart sounds.  Pulmonary:     Effort: No respiratory distress.     Breath sounds: Decreased breath sounds and rhonchi present. No wheezing or rales.  Abdominal:     General: Bowel sounds are normal. There is no distension.     Palpations: Abdomen is soft.     Tenderness: There is no abdominal tenderness. There is no guarding or rebound.           Assessment & Plan:  Need for prophylactic vaccination and inoculation against influenza - Plan: Flu Vaccine QUAD High Dose(Fluad)  Hemoptysis - Plan: budesonide-formoterol (SYMBICORT) 160-4.5 MCG/ACT inhaler  Pulmonary emphysema, unspecified emphysema type ([place]) - Plan: albuterol (PROVENTIL) (2.5 MG/3ML) 0.083% nebulizer solution  Patient had an isolated instance of hemoptysis that may have come from bleeding on his tongue.  At this point he has had no further episodes of hemoptysis.  His CT scan of his lungs was clear.  We discussed a referral to pulmonology for bronchoscopy.  Given the current situation and his frailty I would not recommend a bronchoscopy unless hemoptysis persist.  Therefore I counseled the patient that if he has any other episodes of hemoptysis, he is to call me immediately so that we can consult pulmonology.  However the present time he is at his baseline and therefore we will institute no further work-up unless hemoptysis returns.  He received his flu shot today.  He has not had his third Covid vaccine.  I spent more than 10 minutes today with the patient trying to explain to him the importance of  getting his booster shot and encouraging him to do so.  I did refill his Symbicort and his albuterol.

## 2021-01-01 ENCOUNTER — Other Ambulatory Visit: Payer: Self-pay

## 2021-01-01 ENCOUNTER — Other Ambulatory Visit: Payer: Self-pay | Admitting: Family Medicine

## 2021-01-01 ENCOUNTER — Telehealth: Payer: Self-pay

## 2021-01-01 ENCOUNTER — Telehealth: Payer: Self-pay | Admitting: Family Medicine

## 2021-01-01 DIAGNOSIS — J449 Chronic obstructive pulmonary disease, unspecified: Secondary | ICD-10-CM | POA: Diagnosis not present

## 2021-01-01 DIAGNOSIS — J439 Emphysema, unspecified: Secondary | ICD-10-CM

## 2021-01-01 DIAGNOSIS — R042 Hemoptysis: Secondary | ICD-10-CM

## 2021-01-01 MED ORDER — ALBUTEROL SULFATE (2.5 MG/3ML) 0.083% IN NEBU: INHALATION_SOLUTION | RESPIRATORY_TRACT | 11 refills | Status: DC

## 2021-01-01 MED ORDER — BUDESONIDE-FORMOTEROL FUMARATE 160-4.5 MCG/ACT IN AERO: INHALATION_SPRAY | RESPIRATORY_TRACT | 5 refills | Status: DC

## 2021-01-01 MED ORDER — ALBUTEROL SULFATE HFA 108 (90 BASE) MCG/ACT IN AERS: 1.0000 | INHALATION_SPRAY | Freq: Four times a day (QID) | RESPIRATORY_TRACT | 3 refills | Status: DC | PRN

## 2021-01-01 NOTE — Telephone Encounter (Signed)
Error

## 2021-01-29 DIAGNOSIS — J449 Chronic obstructive pulmonary disease, unspecified: Secondary | ICD-10-CM | POA: Diagnosis not present

## 2021-01-29 NOTE — Telephone Encounter (Signed)
No action needed at this time, closing open encounter

## 2021-03-01 DIAGNOSIS — J449 Chronic obstructive pulmonary disease, unspecified: Secondary | ICD-10-CM | POA: Diagnosis not present

## 2021-03-31 DIAGNOSIS — J449 Chronic obstructive pulmonary disease, unspecified: Secondary | ICD-10-CM | POA: Diagnosis not present

## 2021-05-01 DIAGNOSIS — J449 Chronic obstructive pulmonary disease, unspecified: Secondary | ICD-10-CM | POA: Diagnosis not present

## 2021-05-31 DIAGNOSIS — J449 Chronic obstructive pulmonary disease, unspecified: Secondary | ICD-10-CM | POA: Diagnosis not present

## 2021-06-05 ENCOUNTER — Emergency Department (HOSPITAL_COMMUNITY)
Admission: EM | Admit: 2021-06-05 | Discharge: 2021-06-05 | Disposition: A | Discharge status: Home / Self Care | Payer: Medicare HMO | Attending: Emergency Medicine | Admitting: Emergency Medicine

## 2021-06-05 ENCOUNTER — Emergency Department (HOSPITAL_COMMUNITY): Payer: Medicare HMO

## 2021-06-05 ENCOUNTER — Other Ambulatory Visit: Payer: Self-pay

## 2021-06-05 ENCOUNTER — Encounter (HOSPITAL_COMMUNITY): Payer: Self-pay

## 2021-06-05 DIAGNOSIS — Z85828 Personal history of other malignant neoplasm of skin: Secondary | ICD-10-CM | POA: Diagnosis not present

## 2021-06-05 DIAGNOSIS — Z87891 Personal history of nicotine dependence: Secondary | ICD-10-CM | POA: Diagnosis not present

## 2021-06-05 DIAGNOSIS — J449 Chronic obstructive pulmonary disease, unspecified: Secondary | ICD-10-CM | POA: Insufficient documentation

## 2021-06-05 DIAGNOSIS — M79672 Pain in left foot: Secondary | ICD-10-CM | POA: Diagnosis not present

## 2021-06-05 DIAGNOSIS — I10 Essential (primary) hypertension: Secondary | ICD-10-CM | POA: Insufficient documentation

## 2021-06-05 MED ORDER — ACETAMINOPHEN 325 MG PO TABS
650.0000 mg | ORAL_TABLET | Freq: Once | ORAL | Status: AC
  Administered 2021-06-05: 650 mg via ORAL
  Filled 2021-06-05: qty 2

## 2021-06-05 NOTE — ED Triage Notes (Signed)
Pt to er, pt states that three months ago he stepped on a rock, pt states that he is here for foot pain from this injury, states that the pain has gotten worse, pt ambulatory to room, pt on 2L O2 via [place], states that he is on 2L home O2, pt c/o L foot pain.

## 2021-06-05 NOTE — ED Provider Notes (Signed)
[facility] EMERGENCY DEPARTMENT Provider Note   CSN: [id number] Arrival date & time: 06/05/21  0920     History Chief Complaint  Patient presents with   Foot Pain    Michael Evans is a 80 y.o. male with a history of COPD, chronic respiratory failure on 2 L via nasal cannula at baseline, hypertension, and sleep apnea who presents to the emergency department with complaints of left foot pain for the past 3 months.  Patient states about 3 months ago he stepped on a rock and he has been having pain to his left foot since.  The pain is located to the bottom of the forefoot, it is constant, it is worse with weightbearing, no alleviating factors.  No intervention prior to arrival.  He denies numbness, tingling, weakness, redness, open wounds, fever, or chills.  HPI     Past Medical History:  Diagnosis Date   Arthritis    Chronic rhinitis    COPD (chronic obstructive pulmonary disease) ([place])    PFT 06-26-09 FEV1  1.75( 71%), FVC 3.65( 101%), FEV1% 48, TLC 5.53(104%), DLCO 55%, no BD   Hypertension    Nasal septal deviation    Nocturia    On home O2    2L N/C   OSA (obstructive sleep apnea) 04/21/2011   Skin cancer     Patient Active Problem List   Diagnosis Date Noted   Tachycardia 09/26/2017   Influenza A 01/24/2016   Acute on chronic respiratory failure with hypoxia ([place]) 01/22/2016   SIRS (systemic inflammatory response syndrome) ([place]) 01/22/2016   Leukocytosis 01/22/2016   Hyperglycemia 01/22/2016   COPD exacerbation ([place]) 02/14/2015   Shingles rash 08/26/2014   Hypoxia 08/26/2014   OSA (obstructive sleep apnea) 04/21/2011   HOARSENESS 10/03/2009   RHINITIS 08/02/2009   COPD with emphysema ([place]) 07/05/2009   Essential hypertension, benign 06/07/2009    Past Surgical History:  Procedure Laterality Date   APPENDECTOMY     BACK SURGERY     CATARACT EXTRACTION W/PHACO  01/06/2013   Procedure: CATARACT EXTRACTION PHACO AND INTRAOCULAR LENS PLACEMENT ([place]);  Surgeon: Tonny Branch, MD;  Location: [facility];  Service: Ophthalmology;  Laterality: Right;  CDE: 22.17       Family History  Problem Relation Age of Onset   Lung cancer Mother     Social History   Tobacco Use   Smoking status: Former    Packs/day: 1.50    Years: 54.00    Pack years: 81.00    Types: Cigarettes    Quit date: 12/01/2001    Years since quitting: 19.5   Smokeless tobacco: Never  Vaping Use   Vaping Use: Never used  Substance Use Topics   Alcohol use: No   Drug use: No    Home Medications Prior to Admission medications   Medication Sig Start Date End Date Taking? Authorizing Provider  albuterol (PROVENTIL) (2.5 MG/3ML) 0.083% nebulizer solution TAKE 3 MLS BY NEBULIZATION EVERY 4 HOURS. AND AS NEEDED FOR SHORTNESS OF BREATH. DX: J44.9 01/01/21   [name], MD  albuterol (VENTOLIN HFA) 108 (90 Base) MCG/ACT inhaler Inhale 1-2 puffs into the lungs every 6 (six) hours as needed for wheezing or shortness of breath. 01/01/21   [name], MD  azithromycin (ZITHROMAX) 250 MG tablet Take 1 tablet (250 mg total) by mouth daily. Take first 2 tablets together, then 1 every day until finished. 11/23/20   Hayden Rasmussen, MD  budesonide-formoterol [facility]) 160-4.5 MCG/ACT inhaler TAKE  2 PUFFS BY MOUTH TWICE A DAY 01/01/21   [name], MD  cyanocobalamin 2000 MCG tablet Take 2,000 mcg by mouth daily.    [provider]  guaiFENesin (MUCINEX) 600 MG 12 hr tablet Take 1 tablet (600 mg total) by mouth 2 (two) times daily. 10/02/17   Kathie Dike, MD  Multiple Vitamin (MULTIVITAMIN) tablet Take 1 tablet by mouth daily.    [provider]  Omega-3 Fatty Acids (FISH OIL CONCENTRATE PO) Take 1 capsule by mouth daily.    [provider]  OXYGEN Inhale 2 L into the lungs continuous.    [provider]  pyridoxine (B-6) 100 MG tablet Take 100 mg by mouth daily.    [provider]    Allergies    Amlodipine, Aspirin, Other, Penicillins,  and Sulfa antibiotics  Review of Systems   Review of Systems  Constitutional:  Negative for chills and fever.  Respiratory:  Negative for shortness of breath.   Cardiovascular:  Negative for chest pain.  Musculoskeletal:  Positive for arthralgias.  Skin:  Negative for color change, rash and wound.  Neurological:  Negative for syncope, weakness and numbness.  All other systems reviewed and are negative.  Physical Exam Updated Vital Signs BP (!) 157/76 (BP Location: Left Arm)   Pulse 75   Temp 97.8 F (36.6 C) (Oral)   Resp 18   Ht 5\' 5"  (1.651 m)   Wt 68 kg   SpO2 97%   BMI 24.96 kg/m   Physical Exam Vitals and nursing note reviewed.  Constitutional:      General: He is not in acute distress.    Appearance: He is not ill-appearing or toxic-appearing.  HENT:     Head: Normocephalic and atraumatic.  Cardiovascular:     Rate and Rhythm: Normal rate.     Pulses:          Dorsalis pedis pulses are 2+ on the right side and 2+ on the left side.       Posterior tibial pulses are 2+ on the right side and 2+ on the left side.  Pulmonary:     Effort: Pulmonary effort is normal.     Comments: Respirations are unlabored on patient's 2 L via nasal cannula that he wears at baseline. Musculoskeletal:     Cervical back: Neck supple.     Comments: Lower extremities: No obvious deformity, appreciable swelling, edema, erythema, ecchymosis, warmth, or open wounds. Patient has intact AROM to bilateral hips, knees, ankles, and all digits. Tender to palpation to the plantar aspect of the left forefoot, pain with tarsal squeeze test.  Otherwise nontender.  Skin:    General: Skin is warm and dry.     Capillary Refill: Capillary refill takes less than 2 seconds.  Neurological:     Mental Status: He is alert.     Comments: Alert. Clear speech. Sensation grossly intact to bilateral lower extremities. 5/5 strength with plantar/dorsiflexion bilaterally. Patient ambulatory   Psychiatric:        Mood  and Affect: Mood normal.        Behavior: Behavior normal.    ED Results / Procedures / Treatments   Labs (all labs ordered are listed, but only abnormal results are displayed) Labs Reviewed - No data to display  EKG None  Radiology DG Foot Complete Left  Result Date: 06/05/2021 CLINICAL DATA:  History of injury.  Pain. EXAM: LEFT FOOT - COMPLETE 3+ VIEW COMPARISON:  No recent. FINDINGS: There is  no evidence of fracture or dislocation. There is no evidence of arthropathy or other focal bone abnormality. Soft tissues are unremarkable. IMPRESSION: No acute abnormality.  No radiopaque foreign body. Electronically Signed   By: Marcello Moores  Register   On: 06/05/2021 10:38    Procedures Procedures   Medications Ordered in ED Medications - No data to display  ED Course  I have reviewed the triage vital signs and the nursing notes.  Pertinent labs & imaging results that were available during my care of the patient were reviewed by me and considered in my medical decision making (see chart for details).    MDM Rules/Calculators/A&P                          Patient presents to the ED with complaints of foot pain x 3 months. Nontoxic, resting comfortably, vitals WNL. Forefoot tenderness. NVI distally.   Additional history obtained:  Additional history obtained from chart review & nursing note review.   Imaging Studies ordered:  I ordered imaging studies which included left foot x-ray, I independently reviewed, formal radiology impression- No acute abnormality.  No radiopaque foreign body  ED Course:  X-ray without fracture, dislocation, or radiopaque foreign body.  There is no erythema, warmth, significant wounds, or fever to suggest findings of cellulitis, osteomyelitis, or septic joint.  Pain is localized to the forefoot, there is no calf tenderness or pitting edema to suggest DVT.  Left lower extremity is very well-perfused, symmetric DP/PT pulses palpated, less than 2-second capillary  refill to all digits, do not suspect ischemia.  Unclear definitive etiology of pain.  Recommended Tylenol and orthopedics follow-up. I discussed results, treatment plan, need for follow-up, and return precautions with the patient. Provided opportunity for questions, patient confirmed understanding and is in agreement with plan.   Portions of this note were generated with [employer]. Dictation errors may occur despite best attempts at proofreading.  Final Clinical Impression(s) / ED Diagnoses Final diagnoses:  Foot pain, left    Rx / DC Orders ED Discharge Orders     None        Amaryllis Dyke, PA-C 06/05/21 1116    Varney Biles, MD 06/11/21 1631

## 2021-06-05 NOTE — Discharge Instructions (Addendum)
Please read and follow all provided instructions.  You have been seen today for left foot pain.   Tests performed today include: An x-ray of the affected area - does NOT show any broken bones or dislocations.  Vital signs. See below for your results today.   Medications:  Please take tylenol per over the counter dosing.   Follow-up instructions: Please follow-up with your primary care provider or the provided orthopedic physician (bone specialist) within 1 week.   Return instructions:  Please return if your digits or extremity are numb or tingling, appear gray or blue, or you have severe pain  Please return if you have redness or fevers.  Please return to the Emergency Department if you experience worsening symptoms.  Please return if you have any other emergent concerns. Additional Information:  Your vital signs today were: BP 133/64   Pulse 66   Temp 97.8 F (36.6 C) (Oral)   Resp 18   Ht 5\' 5"  (1.651 m)   Wt 68 kg   SpO2 96%   BMI 24.96 kg/m  If your blood pressure (BP) was elevated above 135/85 this visit, please have this repeated by your doctor within one month. ---------------

## 2021-06-13 ENCOUNTER — Encounter: Payer: Self-pay | Admitting: Family Medicine

## 2021-06-13 ENCOUNTER — Ambulatory Visit (INDEPENDENT_AMBULATORY_CARE_PROVIDER_SITE_OTHER): Payer: Medicare HMO | Admitting: Family Medicine

## 2021-06-13 ENCOUNTER — Other Ambulatory Visit: Payer: Self-pay

## 2021-06-13 VITALS — BP 134/78 | HR 82 | Temp 98.7°F | Resp 16 | Ht 65.0 in | Wt 151.0 lb

## 2021-06-13 DIAGNOSIS — L570 Actinic keratosis: Secondary | ICD-10-CM | POA: Diagnosis not present

## 2021-06-13 DIAGNOSIS — M7742 Metatarsalgia, left foot: Secondary | ICD-10-CM | POA: Diagnosis not present

## 2021-06-13 NOTE — Progress Notes (Signed)
Subjective:    Patient ID: Michael Evans, male    DOB: January 11, 1941, 80 y.o.   MRN: [id number]  HPI Patient has 2 concerns.  First he has an erythematous scaly hard papule on the dorsal surface of his right forearm.  Is 1.5 cm in diameter.  It appears to be either an inflamed seborrheic keratosis although I cannot exclude a squamous cell carcinoma.  He states that it itches.  We discussed treatment options including excision and biopsy versus cryotherapy and he would like to try cryotherapy first.  Second issue is pain on his left foot.  He states that for the last year he has had significant pain on the plantar surface of his left foot at the base of the third metatarsal.  He has a collapsed transverse arch with tenderness over the metatarsal head of the third metatarsal.  He has pain with flexion and extension of the third MTP joint.  There is no erythema or bruising or swelling or visible foreign body or ulceration Past Medical History:  Diagnosis Date   Arthritis    Chronic rhinitis    COPD (chronic obstructive pulmonary disease) (HCC)    PFT 06-26-09 FEV1  1.75( 71%), FVC 3.65( 101%), FEV1% 48, TLC 5.53(104%), DLCO 55%, no BD   Hypertension    Nasal septal deviation    Nocturia    On home O2    2L N/C   OSA (obstructive sleep apnea) 04/21/2011   Skin cancer    Past Surgical History:  Procedure Laterality Date   APPENDECTOMY     BACK SURGERY     CATARACT EXTRACTION W/PHACO  01/06/2013   Procedure: CATARACT EXTRACTION PHACO AND INTRAOCULAR LENS PLACEMENT ([place]);  Surgeon: Tonny Branch, MD;  Location: [facility];  Service: Ophthalmology;  Laterality: Right;  CDE: 22.17   Current Outpatient Medications on File Prior to Visit  Medication Sig Dispense Refill   albuterol (PROVENTIL) (2.5 MG/3ML) 0.083% nebulizer solution TAKE 3 MLS BY NEBULIZATION EVERY 4 HOURS. AND AS NEEDED FOR SHORTNESS OF BREATH. DX: J44.9 150 mL 11   albuterol (VENTOLIN HFA) 108 (90 Base) MCG/ACT inhaler Inhale 1-2 puffs into  the lungs every 6 (six) hours as needed for wheezing or shortness of breath. 1 each 3   budesonide-formoterol (SYMBICORT) 160-4.5 MCG/ACT inhaler TAKE 2 PUFFS BY MOUTH TWICE A DAY 30.6 each 5   cyanocobalamin 2000 MCG tablet Take 2,000 mcg by mouth daily.     Multiple Vitamin (MULTIVITAMIN) tablet Take 1 tablet by mouth daily.     Omega-3 Fatty Acids (FISH OIL CONCENTRATE PO) Take 1 capsule by mouth daily.     OXYGEN Inhale 2 L into the lungs continuous.     pyridoxine (B-6) 100 MG tablet Take 100 mg by mouth daily.     No current facility-administered medications on file prior to visit.   Allergies  Allergen Reactions   Amlodipine Swelling    Swelling in feet   Aspirin Other (See Comments)    Upset stomach.   Other Hypertension    Hazelnuts   Penicillins Hives    Has patient had a PCN reaction causing immediate rash, facial/tongue/throat swelling, SOB or lightheadedness with hypotension: Yes Has patient had a PCN reaction causing severe rash involving mucus membranes or skin necrosis: No Has patient had a PCN reaction that required hospitalization No Has patient had a PCN reaction occurring within the last 10 years: No If all of the above answers are "NO", then may proceed with Cephalosporin use.  Sulfa Antibiotics Hives   Social History   Socioeconomic History   Marital status: Divorced    Spouse name: Not on file   Number of children: Not on file   Years of education: Not on file   Highest education level: Not on file  Occupational History   Occupation: Neurosurgeon: RETIRED  Tobacco Use   Smoking status: Former    Packs/day: 1.50    Years: 54.00    Pack years: 81.00    Types: Cigarettes    Quit date: 12/01/2001    Years since quitting: 19.5   Smokeless tobacco: Never  Vaping Use   Vaping Use: Never used  Substance and Sexual Activity   Alcohol use: No   Drug use: No   Sexual activity: Not on file  Other Topics Concern   Not on file  Social History  Narrative   Not on file   Social Determinants of Health   Financial Resource Strain: Not on file  Food Insecurity: Not on file  Transportation Needs: Not on file  Physical Activity: Not on file  Stress: Not on file  Social Connections: Not on file  Intimate Partner Violence: Not on file     Review of Systems  All other systems reviewed and are negative.     Objective:   Physical Exam Constitutional:      Appearance: Normal appearance.  Cardiovascular:     Rate and Rhythm: Normal rate and regular rhythm.  Pulmonary:     Effort: No respiratory distress.     Breath sounds: Wheezing present.  Musculoskeletal:       Arms:     Left foot: Normal range of motion. Prominent metatarsal heads and tenderness present. No swelling or deformity.       Feet:  Neurological:     Mental Status: He is alert.          Assessment & Plan:  AK (actinic keratosis)  Metatarsalgia, left foot - Plan: Ambulatory referral to Podiatry The 1.5 cm lesion on his right dorsal forearm is either an inflamed seborrheic keratosis, an actinic keratosis, or an early squamous cell carcinoma.  We treated the lesion with 30 seconds of liquid nitrogen cryotherapy.  If persistent, I would recommend an excisional biopsy.  I believe the pain in his left foot is likely metatarsalgia due to a collapsed transverse arch.  Recommended podiatry consultation to discuss possible orthotics versus other treatment options.  I do not believe that this is a Morton's neuroma given the location of the pain.

## 2021-07-01 DIAGNOSIS — J449 Chronic obstructive pulmonary disease, unspecified: Secondary | ICD-10-CM | POA: Diagnosis not present

## 2021-07-04 ENCOUNTER — Emergency Department (HOSPITAL_COMMUNITY)
Admission: EM | Admit: 2021-07-04 | Discharge: 2021-07-04 | Disposition: A | Discharge status: Home / Self Care | Payer: Medicare HMO | Attending: Emergency Medicine | Admitting: Emergency Medicine

## 2021-07-04 ENCOUNTER — Other Ambulatory Visit: Payer: Self-pay

## 2021-07-04 ENCOUNTER — Encounter (HOSPITAL_COMMUNITY): Payer: Self-pay | Admitting: Emergency Medicine

## 2021-07-04 DIAGNOSIS — J449 Chronic obstructive pulmonary disease, unspecified: Secondary | ICD-10-CM | POA: Diagnosis not present

## 2021-07-04 DIAGNOSIS — Z87891 Personal history of nicotine dependence: Secondary | ICD-10-CM | POA: Insufficient documentation

## 2021-07-04 DIAGNOSIS — X58XXXA Exposure to other specified factors, initial encounter: Secondary | ICD-10-CM | POA: Insufficient documentation

## 2021-07-04 DIAGNOSIS — Z20822 Contact with and (suspected) exposure to covid-19: Secondary | ICD-10-CM | POA: Diagnosis not present

## 2021-07-04 DIAGNOSIS — I10 Essential (primary) hypertension: Secondary | ICD-10-CM | POA: Insufficient documentation

## 2021-07-04 DIAGNOSIS — T162XXA Foreign body in left ear, initial encounter: Secondary | ICD-10-CM | POA: Diagnosis not present

## 2021-07-04 DIAGNOSIS — Z79899 Other long term (current) drug therapy: Secondary | ICD-10-CM | POA: Insufficient documentation

## 2021-07-04 LAB — SARS CORONAVIRUS 2 (TAT 6-24 HRS): SARS Coronavirus 2: NEGATIVE

## 2021-07-04 MED ORDER — LIDOCAINE VISCOUS HCL 2 % MT SOLN
15.0000 mL | Freq: Once | OROMUCOSAL | Status: AC
  Administered 2021-07-04: 15 mL via OROMUCOSAL
  Filled 2021-07-04: qty 15

## 2021-07-04 NOTE — ED Provider Notes (Signed)
[facility] EMERGENCY DEPARTMENT Provider Note   CSN: [id number] Arrival date & time: 07/04/21  0916     History Chief Complaint  Patient presents with   Foreign Body in [place] is a 80 y.o. male was outdoors just prior to arrival when a bee flew in his left ear.  He denies pain in the ear, states it has not stung him but he does have significant irritation as the insect continues to move inside of his ear canal.  He is not allergic to bees.  No other complaints.  The history is provided by the patient.      Past Medical History:  Diagnosis Date   Arthritis    Chronic rhinitis    COPD (chronic obstructive pulmonary disease) ([place])    PFT 06-26-09 FEV1  1.75( 71%), FVC 3.65( 101%), FEV1% 48, TLC 5.53(104%), DLCO 55%, no BD   Hypertension    Nasal septal deviation    Nocturia    On home O2    2L N/C   OSA (obstructive sleep apnea) 04/21/2011   Skin cancer     Patient Active Problem List   Diagnosis Date Noted   Tachycardia 09/26/2017   Influenza A 01/24/2016   Acute on chronic respiratory failure with hypoxia ([place]) 01/22/2016   SIRS (systemic inflammatory response syndrome) ([place]) 01/22/2016   Leukocytosis 01/22/2016   Hyperglycemia 01/22/2016   COPD exacerbation ([place]) 02/14/2015   Shingles rash 08/26/2014   Hypoxia 08/26/2014   OSA (obstructive sleep apnea) 04/21/2011   HOARSENESS 10/03/2009   RHINITIS 08/02/2009   COPD with emphysema ([place]) 07/05/2009   Essential hypertension, benign 06/07/2009    Past Surgical History:  Procedure Laterality Date   APPENDECTOMY     BACK SURGERY     CATARACT EXTRACTION W/PHACO  01/06/2013   Procedure: CATARACT EXTRACTION PHACO AND INTRAOCULAR LENS PLACEMENT ([place]);  Surgeon: Tonny Branch, MD;  Location: [facility];  Service: Ophthalmology;  Laterality: Right;  CDE: 22.17       Family History  Problem Relation Age of Onset   Lung cancer Mother     Social History   Tobacco Use   Smoking status: Former    Packs/day:  1.50    Years: 54.00    Pack years: 81.00    Types: Cigarettes    Quit date: 12/01/2001    Years since quitting: 19.6   Smokeless tobacco: Never  Vaping Use   Vaping Use: Never used  Substance Use Topics   Alcohol use: No   Drug use: No    Home Medications Prior to Admission medications   Medication Sig Start Date End Date Taking? Authorizing Provider  albuterol (PROVENTIL) (2.5 MG/3ML) 0.083% nebulizer solution TAKE 3 MLS BY NEBULIZATION EVERY 4 HOURS. AND AS NEEDED FOR SHORTNESS OF BREATH. DX: J44.9 01/01/21   [name], MD  albuterol (VENTOLIN HFA) 108 (90 Base) MCG/ACT inhaler Inhale 1-2 puffs into the lungs every 6 (six) hours as needed for wheezing or shortness of breath. 01/01/21   [name], MD  budesonide-formoterol (SYMBICORT) 160-4.5 MCG/ACT inhaler TAKE 2 PUFFS BY MOUTH TWICE A DAY 01/01/21   [name], MD  cyanocobalamin 2000 MCG tablet Take 2,000 mcg by mouth daily.    [provider]  Multiple Vitamin (MULTIVITAMIN) tablet Take 1 tablet by mouth daily.    [provider]  Omega-3 Fatty Acids (FISH OIL CONCENTRATE PO) Take 1 capsule by mouth daily.    [provider]  OXYGEN Inhale 2 L into the lungs continuous.    [provider]  pyridoxine (B-6) 100 MG tablet Take 100 mg by mouth daily.    [provider]    Allergies    Amlodipine, Aspirin, Other, Penicillins, and Sulfa antibiotics  Review of Systems   Review of Systems  Constitutional:  Negative for chills and fever.  HENT:  Positive for ear pain. Negative for congestion, rhinorrhea, sinus pressure, sore throat, trouble swallowing and voice change.   Eyes:  Negative for discharge.  Respiratory:  Positive for cough. Negative for shortness of breath, wheezing and stridor.   Cardiovascular:  Negative for chest pain.  Gastrointestinal:  Negative for abdominal pain.  Genitourinary: Negative.    Physical Exam Updated Vital Signs BP (!) 170/95 (BP  Location: Right Arm)   Pulse 99   Temp 99.1 F (37.3 C) (Oral)   Resp 17   Ht '5\' 5"'$  (1.651 m)   Wt 68.5 kg   SpO2 95% Comment: chronic O2 2 L  BMI 25.13 kg/m   Physical Exam Vitals and nursing note reviewed.  Constitutional:      General: He is not in acute distress.    Appearance: He is well-developed.  HENT:     Head: Normocephalic and atraumatic.     Comments: Obvious bee noted in left ear canal.    Mouth/Throat:     Pharynx: Oropharynx is clear.  Eyes:     Conjunctiva/sclera: Conjunctivae normal.  Cardiovascular:     Rate and Rhythm: Normal rate and regular rhythm.     Heart sounds: Normal heart sounds.  Pulmonary:     Effort: Pulmonary effort is normal.     Breath sounds: Normal breath sounds. No stridor. No wheezing.  Musculoskeletal:        General: Normal range of motion.     Cervical back: Normal range of motion.  Skin:    General: Skin is warm and dry.  Neurological:     Mental Status: He is alert.    ED Results / Procedures / Treatments   Labs (all labs ordered are listed, but only abnormal results are displayed) Labs Reviewed  SARS CORONAVIRUS 2 (TAT 6-24 HRS)    EKG None  Radiology No results found.  Procedures .Foreign Body Removal  Date/Time: 07/04/2021 10:21 AM Performed by: Evalee [place], PA-C Authorized by: Evalee [place], PA-C  Consent: Verbal consent obtained. Risks and benefits: risks, benefits and alternatives were discussed Consent given by: patient Patient understanding: patient states understanding of the procedure being performed Patient identity confirmed: verbally with patient Time out: Immediately prior to procedure a "time out" was called to verify the correct patient, procedure, equipment, support staff and site/side marked as required. Body area: ear Location details: left ear  Anesthesia: Local anesthetic: topical viscous lidocaine. Anesthetic total: 5 mL  Sedation: Patient sedated: no  Patient restrained: no Patient  cooperative: yes Localization method: ENT speculum Removal mechanism: alligator forceps Complexity: simple 1 objects recovered. Objects recovered: intact, dead bee Post-procedure assessment: foreign body removed Patient tolerance: patient tolerated the procedure well with no immediate complications      Medications Ordered in ED Medications  lidocaine (XYLOCAINE) 2 % viscous mouth solution 15 mL (15 mLs Mouth/Throat Given 07/04/21 0947)    ED Course  I have reviewed the triage vital signs and the nursing notes.  Pertinent labs & imaging results that were available during my care of the patient were reviewed by me and considered in my medical decision making (  see chart for details).    MDM Rules/Calculators/A&P                           During patient's ED stay he inquired about screening for COVID-19.  He has not had any known exposures.  He is COVID vaccinated.  He has a chronic wet sounding cough which he states is not worse than his normal cough.  He is on oxygen at baseline 2L, no worsened sob, no fevers or chills, he simply would like to be screened since he is here.  Covid screening was performed.    Prn f/u anticipated.  QUENTYN CHEVALIER was evaluated in Emergency Department on 07/04/2021 for the symptoms described in the history of present illness. He was evaluated in the context of the global COVID-19 pandemic, which necessitated consideration that the patient might be at risk for infection with the SARS-CoV-2 virus that causes COVID-19. Institutional protocols and algorithms that pertain to the evaluation of patients at risk for COVID-19 are in a state of rapid change based on information released by regulatory bodies including the CDC and federal and state organizations. These policies and algorithms were followed during the patient's care in the ED.  Final Clinical Impression(s) / ED Diagnoses Final diagnoses:  Ear foreign body, left, initial encounter    Rx / DC Orders ED  Discharge Orders     None        Landis Martins 07/04/21 1023    Lajean Saver, MD 07/15/21 1714

## 2021-07-04 NOTE — ED Notes (Signed)
Pt laying in bed awaiting discharge papers.

## 2021-07-04 NOTE — ED Triage Notes (Signed)
Pt states he has a honey bee in his left ear.

## 2021-07-04 NOTE — ED Notes (Signed)
Medication administered into left ear per orders.

## 2021-07-04 NOTE — Discharge Instructions (Addendum)
The bee has been removed from your ear and there is no sign of any stings in your ear canal.  Your Covid test which you requested should result within the next 24 hours - you will receive a phone call if it is positive.  You may also call here tomorrow afternoon to inquire about your test result.

## 2021-08-01 DIAGNOSIS — J449 Chronic obstructive pulmonary disease, unspecified: Secondary | ICD-10-CM | POA: Diagnosis not present

## 2021-08-07 DIAGNOSIS — J441 Chronic obstructive pulmonary disease with (acute) exacerbation: Secondary | ICD-10-CM | POA: Diagnosis not present

## 2021-08-31 DIAGNOSIS — J449 Chronic obstructive pulmonary disease, unspecified: Secondary | ICD-10-CM | POA: Diagnosis not present

## 2021-09-06 DIAGNOSIS — J441 Chronic obstructive pulmonary disease with (acute) exacerbation: Secondary | ICD-10-CM | POA: Diagnosis not present

## 2021-09-17 ENCOUNTER — Emergency Department (HOSPITAL_COMMUNITY)
Admission: EM | Admit: 2021-09-17 | Discharge: 2021-09-17 | Disposition: A | Discharge status: Home / Self Care | Payer: Medicare HMO | Attending: Emergency Medicine | Admitting: Emergency Medicine

## 2021-09-17 ENCOUNTER — Emergency Department (HOSPITAL_COMMUNITY): Payer: Medicare HMO

## 2021-09-17 ENCOUNTER — Other Ambulatory Visit: Payer: Self-pay

## 2021-09-17 ENCOUNTER — Encounter (HOSPITAL_COMMUNITY): Payer: Self-pay

## 2021-09-17 DIAGNOSIS — J441 Chronic obstructive pulmonary disease with (acute) exacerbation: Secondary | ICD-10-CM | POA: Insufficient documentation

## 2021-09-17 DIAGNOSIS — Z79899 Other long term (current) drug therapy: Secondary | ICD-10-CM | POA: Diagnosis not present

## 2021-09-17 DIAGNOSIS — K4091 Unilateral inguinal hernia, without obstruction or gangrene, recurrent: Secondary | ICD-10-CM | POA: Diagnosis not present

## 2021-09-17 DIAGNOSIS — Z87891 Personal history of nicotine dependence: Secondary | ICD-10-CM | POA: Insufficient documentation

## 2021-09-17 DIAGNOSIS — J45909 Unspecified asthma, uncomplicated: Secondary | ICD-10-CM | POA: Insufficient documentation

## 2021-09-17 DIAGNOSIS — Z20822 Contact with and (suspected) exposure to covid-19: Secondary | ICD-10-CM | POA: Diagnosis not present

## 2021-09-17 DIAGNOSIS — J439 Emphysema, unspecified: Secondary | ICD-10-CM | POA: Diagnosis not present

## 2021-09-17 DIAGNOSIS — J9811 Atelectasis: Secondary | ICD-10-CM | POA: Diagnosis not present

## 2021-09-17 DIAGNOSIS — R059 Cough, unspecified: Secondary | ICD-10-CM | POA: Diagnosis not present

## 2021-09-17 DIAGNOSIS — I10 Essential (primary) hypertension: Secondary | ICD-10-CM | POA: Diagnosis not present

## 2021-09-17 DIAGNOSIS — I700 Atherosclerosis of aorta: Secondary | ICD-10-CM | POA: Diagnosis not present

## 2021-09-17 LAB — RESP PANEL BY RT-PCR (FLU A&B, COVID) ARPGX2
Influenza A by PCR: NEGATIVE
Influenza B by PCR: NEGATIVE
SARS Coronavirus 2 by RT PCR: NEGATIVE

## 2021-09-17 MED ORDER — AZITHROMYCIN 250 MG PO TABS
500.0000 mg | ORAL_TABLET | Freq: Once | ORAL | Status: AC
  Administered 2021-09-17: 500 mg via ORAL
  Filled 2021-09-17: qty 2

## 2021-09-17 MED ORDER — GUAIFENESIN-CODEINE 100-10 MG/5ML PO SOLN
10.0000 mL | Freq: Once | ORAL | Status: AC
  Administered 2021-09-17: 10 mL via ORAL
  Filled 2021-09-17: qty 10

## 2021-09-17 MED ORDER — IPRATROPIUM-ALBUTEROL 0.5-2.5 (3) MG/3ML IN SOLN
3.0000 mL | Freq: Once | RESPIRATORY_TRACT | Status: AC
  Administered 2021-09-17: 3 mL via RESPIRATORY_TRACT
  Filled 2021-09-17: qty 3

## 2021-09-17 MED ORDER — PREDNISONE 50 MG PO TABS
60.0000 mg | ORAL_TABLET | Freq: Once | ORAL | Status: AC
  Administered 2021-09-17: 60 mg via ORAL
  Filled 2021-09-17: qty 1

## 2021-09-17 MED ORDER — GUAIFENESIN-CODEINE 100-10 MG/5ML PO SOLN: 10.0000 mL | Freq: Four times a day (QID) | ORAL | 0 refills | Status: DC | PRN

## 2021-09-17 MED ORDER — BENZONATATE 100 MG PO CAPS
200.0000 mg | ORAL_CAPSULE | Freq: Once | ORAL | Status: AC
  Administered 2021-09-17: 200 mg via ORAL
  Filled 2021-09-17: qty 2

## 2021-09-17 MED ORDER — AZITHROMYCIN 250 MG PO TABS: 500.0000 mg | ORAL_TABLET | Freq: Once | ORAL | Status: DC

## 2021-09-17 MED ORDER — AZITHROMYCIN 250 MG PO TABS: 250.0000 mg | ORAL_TABLET | Freq: Every day | ORAL | 0 refills | Status: AC

## 2021-09-17 NOTE — ED Provider Notes (Signed)
[facility] EMERGENCY DEPARTMENT Provider Note   CSN: [id number] Arrival date & time: 09/17/21  6237     History Chief Complaint  Patient presents with   Abdominal Pain    [facility] is a 80 y.o. male with a history as outlined below, most significant for COPD on chronic home oxygen at 2L, hypertension and a known left inguinal hernia which he has had "for a long time".  He presents today secondary to increasing cough which is productive of a white frothy sputum.  He complains that with each coughing episode his hernia pops out and is very painful, although he is able to easily reduce it and the pain completely resolves.  He denies nausea or vomiting, chest pain, wheezing, also denies abdominal pain except with these exacerbations triggered by the cough. He does endorse chronic sob, not particularly worse today.  He has had no fevers, he is COVID vaccinated.  The history is provided by the patient.      Past Medical History:  Diagnosis Date   Arthritis    Chronic rhinitis    COPD (chronic obstructive pulmonary disease) ([place])    PFT 06-26-09 FEV1  1.75( 71%), FVC 3.65( 101%), FEV1% 48, TLC 5.53(104%), DLCO 55%, no BD   Hypertension    Nasal septal deviation    Nocturia    On home O2    2L N/C   OSA (obstructive sleep apnea) 04/21/2011   Skin cancer     Patient Active Problem List   Diagnosis Date Noted   Tachycardia 09/26/2017   Influenza A 01/24/2016   Acute on chronic respiratory failure with hypoxia ([place]) 01/22/2016   SIRS (systemic inflammatory response syndrome) ([place]) 01/22/2016   Leukocytosis 01/22/2016   Hyperglycemia 01/22/2016   COPD exacerbation ([place]) 02/14/2015   Shingles rash 08/26/2014   Hypoxia 08/26/2014   OSA (obstructive sleep apnea) 04/21/2011   HOARSENESS 10/03/2009   RHINITIS 08/02/2009   COPD with emphysema ([place]) 07/05/2009   Essential hypertension, benign 06/07/2009    Past Surgical History:  Procedure Laterality Date   APPENDECTOMY      BACK SURGERY     CATARACT EXTRACTION W/PHACO  01/06/2013   Procedure: CATARACT EXTRACTION PHACO AND INTRAOCULAR LENS PLACEMENT ([place]);  Surgeon: Tonny Branch, MD;  Location: [facility];  Service: Ophthalmology;  Laterality: Right;  CDE: 22.17       Family History  Problem Relation Age of Onset   Lung cancer Mother     Social History   Tobacco Use   Smoking status: Former    Packs/day: 1.50    Years: 54.00    Pack years: 81.00    Types: Cigarettes    Quit date: 12/01/2001    Years since quitting: 19.8   Smokeless tobacco: Never  Vaping Use   Vaping Use: Never used  Substance Use Topics   Alcohol use: No   Drug use: No    Home Medications Prior to Admission medications   Medication Sig Start Date End Date Taking? Authorizing Provider  albuterol (PROVENTIL) (2.5 MG/3ML) 0.083% nebulizer solution TAKE 3 MLS BY NEBULIZATION EVERY 4 HOURS. AND AS NEEDED FOR SHORTNESS OF BREATH. DX: J44.9 01/01/21  Yes [name], MD  albuterol (VENTOLIN HFA) 108 (90 Base) MCG/ACT inhaler Inhale 1-2 puffs into the lungs every 6 (six) hours as needed for wheezing or shortness of breath. 01/01/21  Yes [name], MD  azithromycin (ZITHROMAX) 250 MG tablet Take 1 tablet (250 mg total) by mouth daily for 4  days. Take 1 tablet daily for 4 days starting on 09/18/2021 09/17/21 09/21/21 Yes Adyan Palau, Almyra Free, PA-C  budesonide-formoterol (SYMBICORT) 160-4.5 MCG/ACT inhaler TAKE 2 PUFFS BY MOUTH TWICE A DAY 01/01/21  Yes [name], MD  cyanocobalamin 2000 MCG tablet Take 2,000 mcg by mouth daily.   Yes [provider]  guaiFENesin-codeine 100-10 MG/5ML syrup Take 10 mLs by mouth every 6 (six) hours as needed for cough. 09/17/21  Yes Trip Cavanagh, Almyra Free, PA-C  Multiple Vitamin (MULTIVITAMIN) tablet Take 1 tablet by mouth daily.   Yes [provider]  Omega-3 Fatty Acids (FISH OIL CONCENTRATE PO) Take 1 capsule by mouth daily.   Yes [provider]  OXYGEN Inhale 2 L into the lungs continuous.    Yes [provider]  pyridoxine (B-6) 100 MG tablet Take 100 mg by mouth daily.   Yes [provider]  zinc sulfate 220 (50 Zn) MG capsule Take 220 mg by mouth daily.   Yes [provider]    Allergies    Amlodipine, Aspirin, Other, Penicillins, and Sulfa antibiotics  Review of Systems   Review of Systems  Constitutional:  Negative for chills and fever.  HENT:  Negative for congestion and rhinorrhea.   Eyes: Negative.   Respiratory:  Positive for cough and shortness of breath. Negative for chest tightness and wheezing.   Cardiovascular:  Negative for chest pain.  Gastrointestinal:  Positive for abdominal distention and abdominal pain. Negative for nausea.  Genitourinary: Negative.   Musculoskeletal:  Negative for arthralgias, joint swelling and neck pain.  Skin: Negative.  Negative for rash and wound.  Neurological:  Negative for dizziness, weakness, light-headedness, numbness and headaches.  Psychiatric/Behavioral: Negative.    All other systems reviewed and are negative.  Physical Exam Updated Vital Signs BP (!) 151/75 (BP Location: Left Arm)   Pulse 75   Temp 98.1 F (36.7 C) (Oral)   Resp (!) 26   Ht 5\' 5"  (1.651 m)   Wt 68 kg   SpO2 97%   BMI 24.96 kg/m   Physical Exam Vitals and nursing note reviewed.  Constitutional:      Appearance: He is well-developed.  HENT:     Head: Normocephalic and atraumatic.  Eyes:     Conjunctiva/sclera: Conjunctivae normal.  Cardiovascular:     Rate and Rhythm: Normal rate and regular rhythm.     Heart sounds: Normal heart sounds.  Pulmonary:     Effort: Pulmonary effort is normal.     Breath sounds: Examination of the right-lower field reveals decreased breath sounds. Decreased breath sounds and rhonchi present. No wheezing.  Abdominal:     General: Bowel sounds are normal.     Palpations: Abdomen is soft.     Tenderness: There is no abdominal tenderness.     Hernia: A hernia is present. Hernia is  present in the left inguinal area.     Comments: Left inguinal hernia appreciated with Valsalva or with cough, this is easily reducible after which he is pain-free at the site.  Musculoskeletal:        General: Normal range of motion.     Cervical back: Normal range of motion.  Skin:    General: Skin is warm and dry.  Neurological:     Mental Status: He is alert.    ED Results / Procedures / Treatments   Labs (all labs ordered are listed, but only abnormal results are displayed) Labs Reviewed  RESP PANEL BY RT-PCR (FLU A&B, COVID) [id number]  POC SARS CORONAVIRUS 2 AG -  ED    EKG None  Radiology CT Chest Wo Contrast  Result Date: 09/17/2021 CLINICAL DATA:  Cough, persistent abnormal cxr EXAM: CT CHEST WITHOUT CONTRAST TECHNIQUE: Multidetector CT imaging of the chest was performed following the standard protocol without IV contrast. COMPARISON:  November 23, 2020 FINDINGS: Cardiovascular: Heart is normal in size. Three-vessel coronary artery atherosclerotic calcifications. Atherosclerotic calcifications of the aorta. Trace pericardial fluid. Mediastinum/Nodes: No axillary or mediastinal adenopathy. Visualized thyroid is unremarkable. Lungs/Pleura: Increased mucous and debris lining the trachea; this accounts for the thickening of the RIGHT paratracheal stripe. Severe emphysematous changes. Lingular, RIGHT middle lobe and RIGHT lower lobe atelectasis, similar in comparison to prior. Increased diffuse bronchial wall thickening with scattered areas of endobronchial debris. Upper Abdomen: No acute abnormality.  Small hiatal hernia. Musculoskeletal: No chest wall mass or suspicious bone lesions identified. IMPRESSION: 1. Increased mucous and debris lining the trachea; this accounts for the thickening of the RIGHT paratracheal stripe. This in combination with diffuse bronchial wall thickening and scattered areas of endobronchial debris are most consistent with bronchitis. Aortic Atherosclerosis  (ICD10-I70.0) and Emphysema (ICD10-J43.9). Electronically Signed   By: Valentino Saxon M.D.   On: 09/17/2021 13:01   DG Chest Port 1 View  Result Date: 09/17/2021 CLINICAL DATA:  productive cough EXAM: PORTABLE CHEST 1 VIEW COMPARISON:  December 24th 2021 FINDINGS: The cardiomediastinal silhouette is unchanged in contour.Atherosclerotic calcifications of the aorta. Possible increased thickening of the RIGHT paratracheal stripe with possible narrowing at the level of the aortic arch. Elevation of the RIGHT hemidiaphragm, unchanged. Emphysematous changes. No pleural effusion. No pneumothorax. No acute pleuroparenchymal abnormality. Visualized abdomen is unremarkable. IMPRESSION: Possible increased thickening of the RIGHT paratracheal stripe in comparison to prior with questionable narrowing of the trachea at level of the aortic arch. Consider further dedicated evaluation with noncontrast chest CT. Electronically Signed   By: Valentino Saxon M.D.   On: 09/17/2021 11:01    Procedures Procedures   Medications Ordered in ED Medications  benzonatate (TESSALON) capsule 200 mg (200 mg Oral Given 09/17/21 1048)  guaiFENesin-codeine 100-10 MG/5ML solution 10 mL (10 mLs Oral Given 09/17/21 1319)  azithromycin (ZITHROMAX) tablet 500 mg (500 mg Oral Given 09/17/21 1500)  ipratropium-albuterol (DUONEB) 0.5-2.5 (3) MG/3ML nebulizer solution 3 mL (3 mLs Nebulization Given 09/17/21 1512)  predniSONE (DELTASONE) tablet 60 mg (60 mg Oral Given 09/17/21 1621)    ED Course  I have reviewed the triage vital signs and the nursing notes.  Pertinent labs & imaging results that were available during my care of the patient were reviewed by me and considered in my medical decision making (see chart for details).  Clinical Course as of 09/17/21 1737  Tue Sep 17, 2021  1502 Patient presenting with chief complaint of intermittent abdominal pain triggered by his coughing, at the site of his known left inguinal  hernia.  Chest x-ray suggested thickening of the peritracheal space, CT scan was negative for acute findings except for bronchitis.  He was ambulated in the department on his 2 L and quickly desaturated to 79 to 80% on this 2 L.  However he states his breathing is "about normal for me".  At reexam, he is still not wheezing but he does have reduced aeration in his right base.  An albuterol and Atrovent neb has been ordered after which we will reevaluate.  If he remains hypoxic he will need admission.  He was given a p.o. dose of Zithromax for bronchitis treatment. [  JI]  1726 After patient received his albuterol and Atrovent nebulizer he was ambulated with his chronic home oxygen and was much improved, did not have significant shortness of breath and oxygen saturation remained 93 to 94%. [JI]    Clinical Course User Index [JI] Landis Martins   MDM Rules/Calculators/A&P                           Patient presenting with acute exacerbation of his chronic bronchitis.  There is no sign of pneumonia but he does have a pretty persistent productive cough.  He was started on a Z-Pak with first dose given here.  He was also given albuterol nebulizer treatment after which he had significantly improved breathing without hypoxia on his chronic home oxygen.  Exam also positive for a nonincarcerated left lower quadrant inguinal hernia, easily reducible, currently symptom-free.  He was given strict return precautions about both of these diagnoses.  He was referred to Dr. Constance Haw regarding the hernia.  Referred to his PCP for recheck of his breathing within the next week, returning here for any worsening symptoms.  He is COVID-negative today.  Patient evaluated by Dr. Roderic Palau during this ED visit. Final Clinical Impression(s) / ED Diagnoses Final diagnoses:  Chronic obstructive pulmonary disease with acute exacerbation ([place])  Unilateral recurrent inguinal hernia without obstruction or gangrene    Rx / DC  Orders ED Discharge Orders          Ordered    guaiFENesin-codeine 100-10 MG/5ML syrup  Every 6 hours PRN        09/17/21 1729    azithromycin (ZITHROMAX) 250 MG tablet  Daily        09/17/21 1729             Evalee [facility], PA-C 09/17/21 1737    Milton Ferguson, MD 09/17/21 1744

## 2021-09-17 NOTE — ED Notes (Signed)
Walked with pt around nurse desk pt sat dropped to 79 stayed in the lower 80's

## 2021-09-17 NOTE — ED Notes (Signed)
Walked pt around nurses desk 2 lt o2 pt sat stayed around 93 and 94

## 2021-09-17 NOTE — ED Triage Notes (Signed)
States he believes he has right inguinal hernia x 3 months. No other sx. Chronic O2 use @ 2L.

## 2021-09-17 NOTE — Discharge Instructions (Signed)
Continue using your home albuterol nebulizer medication if needed for return of shortness of breath or wheezing.  Also continue using your Symbicort inhaler.  Take your next dose of Zithromax tomorrow with your evening meal.  You have also been prescribed Robitussin-AC which is a cough suppressant.  It will also help to cough up your sputum easier as long as you are drinking plenty of fluids to stay hydrated.  There is no sign of any dangerous hernia symptoms at this time, however if you develop pain and swelling at the site that does not resolve using gentle pressure when you are lying down after 15 minutes return here immediately for reevaluation as this can be a sign that the intestine is caught in this opening and would need emergent surgery.  As long as this does not occur, it is safe for you to plan to follow-up with a surgeon as an outpatient.  Please call Dr. Constance Haw office tomorrow morning for an office visit to discuss having this hernia repaired.

## 2021-09-23 ENCOUNTER — Telehealth: Payer: Self-pay | Admitting: *Deleted

## 2021-09-23 NOTE — Telephone Encounter (Signed)
Received call from patient spouse.   Reports that patient was seen in ER for COPD Exacerbation. States that he was given ABTx and cough medication, but Sx have not improved.   Offered appt with NP as PCP is unavailable until Thursday, but patient declined. States that he will go back to UC.

## 2021-09-24 ENCOUNTER — Inpatient Hospital Stay (HOSPITAL_COMMUNITY)
Admission: EM | Admit: 2021-09-24 | Discharge: 2021-09-29 | DRG: 190 | Disposition: A | Discharge status: Home / Self Care | Payer: Medicare HMO | Attending: Family Medicine | Admitting: Family Medicine

## 2021-09-24 ENCOUNTER — Other Ambulatory Visit: Payer: Self-pay

## 2021-09-24 ENCOUNTER — Emergency Department (HOSPITAL_COMMUNITY): Payer: Medicare HMO

## 2021-09-24 ENCOUNTER — Encounter (HOSPITAL_COMMUNITY): Payer: Self-pay | Admitting: Emergency Medicine

## 2021-09-24 DIAGNOSIS — J441 Chronic obstructive pulmonary disease with (acute) exacerbation: Principal | ICD-10-CM | POA: Diagnosis present

## 2021-09-24 DIAGNOSIS — Z9981 Dependence on supplemental oxygen: Secondary | ICD-10-CM

## 2021-09-24 DIAGNOSIS — J9621 Acute and chronic respiratory failure with hypoxia: Secondary | ICD-10-CM | POA: Diagnosis not present

## 2021-09-24 DIAGNOSIS — Z79899 Other long term (current) drug therapy: Secondary | ICD-10-CM

## 2021-09-24 DIAGNOSIS — Z87891 Personal history of nicotine dependence: Secondary | ICD-10-CM | POA: Diagnosis not present

## 2021-09-24 DIAGNOSIS — R739 Hyperglycemia, unspecified: Secondary | ICD-10-CM | POA: Diagnosis present

## 2021-09-24 DIAGNOSIS — Z801 Family history of malignant neoplasm of trachea, bronchus and lung: Secondary | ICD-10-CM

## 2021-09-24 DIAGNOSIS — Z20822 Contact with and (suspected) exposure to covid-19: Secondary | ICD-10-CM | POA: Diagnosis not present

## 2021-09-24 DIAGNOSIS — R000 Tachycardia, unspecified: Secondary | ICD-10-CM | POA: Diagnosis not present

## 2021-09-24 DIAGNOSIS — J209 Acute bronchitis, unspecified: Secondary | ICD-10-CM | POA: Diagnosis present

## 2021-09-24 DIAGNOSIS — E875 Hyperkalemia: Secondary | ICD-10-CM | POA: Diagnosis present

## 2021-09-24 DIAGNOSIS — J9620 Acute and chronic respiratory failure, unspecified whether with hypoxia or hypercapnia: Secondary | ICD-10-CM | POA: Diagnosis present

## 2021-09-24 DIAGNOSIS — J440 Chronic obstructive pulmonary disease with acute lower respiratory infection: Secondary | ICD-10-CM | POA: Diagnosis not present

## 2021-09-24 DIAGNOSIS — Z7951 Long term (current) use of inhaled steroids: Secondary | ICD-10-CM | POA: Diagnosis not present

## 2021-09-24 DIAGNOSIS — G4733 Obstructive sleep apnea (adult) (pediatric): Secondary | ICD-10-CM | POA: Diagnosis present

## 2021-09-24 DIAGNOSIS — J439 Emphysema, unspecified: Secondary | ICD-10-CM | POA: Diagnosis not present

## 2021-09-24 DIAGNOSIS — R059 Cough, unspecified: Secondary | ICD-10-CM

## 2021-09-24 DIAGNOSIS — R0600 Dyspnea, unspecified: Secondary | ICD-10-CM | POA: Diagnosis not present

## 2021-09-24 DIAGNOSIS — J449 Chronic obstructive pulmonary disease, unspecified: Secondary | ICD-10-CM | POA: Diagnosis present

## 2021-09-24 DIAGNOSIS — I10 Essential (primary) hypertension: Secondary | ICD-10-CM | POA: Diagnosis present

## 2021-09-24 DIAGNOSIS — Z85828 Personal history of other malignant neoplasm of skin: Secondary | ICD-10-CM

## 2021-09-24 DIAGNOSIS — R0602 Shortness of breath: Secondary | ICD-10-CM | POA: Diagnosis not present

## 2021-09-24 LAB — BASIC METABOLIC PANEL
Anion gap: 9 (ref 5–15)
BUN: 22 mg/dL (ref 8–23)
CO2: 22 mmol/L (ref 22–32)
Calcium: 8.8 mg/dL — ABNORMAL LOW (ref 8.9–10.3)
Chloride: 102 mmol/L (ref 98–111)
Creatinine, Ser: 0.89 mg/dL (ref 0.61–1.24)
GFR, Estimated: >60 mL/min (ref >60)
Glucose, Bld: 140 mg/dL — ABNORMAL HIGH (ref 70–99)
Potassium: 5.8 mmol/L — ABNORMAL HIGH (ref 3.5–5.1)
Sodium: 133 mmol/L — ABNORMAL LOW (ref 135–145)

## 2021-09-24 LAB — RESP PANEL BY RT-PCR (FLU A&B, COVID) ARPGX2
Influenza A by PCR: NEGATIVE
Influenza B by PCR: NEGATIVE
SARS Coronavirus 2 by RT PCR: NEGATIVE

## 2021-09-24 LAB — CBC WITH DIFFERENTIAL/PLATELET
Abs Immature Granulocytes: 0.06 10*3/uL (ref 0.00–0.07)
Basophils Absolute: 0 10*3/uL (ref 0.0–0.1)
Basophils Relative: 0 %
Eosinophils Absolute: 0.2 10*3/uL (ref 0.0–0.5)
Eosinophils Relative: 2 %
HCT: 44.2 % (ref 39.0–52.0)
Hemoglobin: 15 g/dL (ref 13.0–17.0)
Immature Granulocytes: 1 %
Lymphocytes Relative: 18 %
Lymphs Abs: 1.9 10*3/uL (ref 0.7–4.0)
MCH: 31.5 pg (ref 26.0–34.0)
MCHC: 33.9 g/dL (ref 30.0–36.0)
MCV: 92.9 fL (ref 80.0–100.0)
Monocytes Absolute: 1.1 10*3/uL — ABNORMAL HIGH (ref 0.1–1.0)
Monocytes Relative: 11 %
Neutro Abs: 7.4 10*3/uL (ref 1.7–7.7)
Neutrophils Relative %: 68 %
Platelets: 232 10*3/uL (ref 150–400)
RBC: 4.76 MIL/uL (ref 4.22–5.81)
RDW: 12.9 % (ref 11.5–15.5)
WBC: 10.6 10*3/uL — ABNORMAL HIGH (ref 4.0–10.5)
nRBC: 0 % (ref 0.0–0.2)

## 2021-09-24 LAB — TROPONIN I (HIGH SENSITIVITY)
Troponin I (High Sensitivity): 4 ng/L (ref <18)
Troponin I (High Sensitivity): 5 ng/L (ref <18)

## 2021-09-24 LAB — POTASSIUM: Potassium: 3.4 mmol/L — ABNORMAL LOW (ref 3.5–5.1)

## 2021-09-24 MED ORDER — ALBUTEROL SULFATE (2.5 MG/3ML) 0.083% IN NEBU
5.0000 mg | INHALATION_SOLUTION | Freq: Once | RESPIRATORY_TRACT | Status: AC
  Administered 2021-09-24: 5 mg via RESPIRATORY_TRACT
  Filled 2021-09-24: qty 6

## 2021-09-24 MED ORDER — METHYLPREDNISOLONE SODIUM SUCC 125 MG IJ SOLR
125.0000 mg | Freq: Once | INTRAMUSCULAR | Status: AC
  Administered 2021-09-24: 125 mg via INTRAVENOUS
  Filled 2021-09-24: qty 2

## 2021-09-24 MED ORDER — PREDNISONE 20 MG PO TABS: 40.0000 mg | ORAL_TABLET | Freq: Every day | ORAL | Status: DC

## 2021-09-24 MED ORDER — SODIUM CHLORIDE 0.9 % IV BOLUS
500.0000 mL | Freq: Once | INTRAVENOUS | Status: AC
  Administered 2021-09-24: 500 mL via INTRAVENOUS

## 2021-09-24 MED ORDER — ENOXAPARIN SODIUM 40 MG/0.4ML IJ SOSY
40.0000 mg | PREFILLED_SYRINGE | INTRAMUSCULAR | Status: DC
  Administered 2021-09-24 – 2021-09-28 (×5): 40 mg via SUBCUTANEOUS
  Filled 2021-09-24 (×5): qty 0.4

## 2021-09-24 MED ORDER — SODIUM ZIRCONIUM CYCLOSILICATE 5 G PO PACK
10.0000 g | PACK | Freq: Once | ORAL | Status: AC
Start: 2021-09-24 — End: 2021-09-24
  Administered 2021-09-24: 10 g via ORAL
  Filled 2021-09-24: qty 2

## 2021-09-24 MED ORDER — METHYLPREDNISOLONE SODIUM SUCC 125 MG IJ SOLR
125.0000 mg | Freq: Two times a day (BID) | INTRAMUSCULAR | Status: AC
  Administered 2021-09-24 – 2021-09-25 (×2): 125 mg via INTRAVENOUS
  Filled 2021-09-24 (×2): qty 2

## 2021-09-24 MED ORDER — ALBUTEROL (5 MG/ML) CONTINUOUS INHALATION SOLN
10.0000 mg/h | INHALATION_SOLUTION | Freq: Once | RESPIRATORY_TRACT | Status: DC
  Filled 2021-09-24: qty 20

## 2021-09-24 MED ORDER — ALBUTEROL SULFATE (2.5 MG/3ML) 0.083% IN NEBU
INHALATION_SOLUTION | RESPIRATORY_TRACT | Status: AC
  Administered 2021-09-24: 5 mg
  Filled 2021-09-24: qty 6

## 2021-09-24 MED ORDER — SODIUM CHLORIDE 0.9 % IV SOLN
100.0000 mg | Freq: Two times a day (BID) | INTRAVENOUS | Status: DC
  Administered 2021-09-24 – 2021-09-28 (×8): 100 mg via INTRAVENOUS
  Filled 2021-09-24 (×13): qty 100

## 2021-09-24 MED ORDER — IPRATROPIUM BROMIDE 0.02 % IN SOLN
0.5000 mg | Freq: Once | RESPIRATORY_TRACT | Status: AC
  Administered 2021-09-24: 0.5 mg via RESPIRATORY_TRACT
  Filled 2021-09-24: qty 2.5

## 2021-09-24 MED ORDER — FUROSEMIDE 10 MG/ML IJ SOLN
40.0000 mg | Freq: Once | INTRAMUSCULAR | Status: AC
  Administered 2021-09-24: 40 mg via INTRAVENOUS
  Filled 2021-09-24: qty 4

## 2021-09-24 NOTE — H&P (Signed)
History and Physical    ETHEN BANNAN RJJ:[id number] DOB: 09/16/1941 DOA: 09/24/2021  Referring MD/NP/PA: Marijean Bravo  PCP: [name], MD  Outpatient Specialists: none Patient coming from: Home   Chief Complaint: Acute on chronic resp failure w/ hypoxia, COPD Exacerbation   HPI: Michael Evans is a 80 y.o. male with medical history significant of COPD, chronic respiratory failure on 2 L, hypertension, arthritis presented with acute on chronic respiratory failure with hypoxia and COPD exacerbation.  Patient noted to have been seen in the ER back on October 18 for similar symptoms.  Was diagnosed with a COPD exacerbation.  Per report, patient was placed on a Z-Pak.  Symptoms have minimally improved over the course of this treatment.  Unclear if patient got steroids with evaluation.  Per the patient, he has had increased cough, wheezing, mild sputum production.  No chest pain.  No nausea or vomiting.  Patient does attribute the flare to likely weather changes.  Patient states that symptoms got worse once the vents were closed in his house.  No fevers or chills.  No reported sick contacts.  Patient denies smoking.  Per the sister, Michael Evans, patient quit smoking years ago.   ED Course: Presented to the ER afebrile, hemodynamically stable.  Requiring 2 to 4 L nasal cannula for oxygenation.  COVID-negative.  Labs notable for K of 5.8.  Resolved after albuterol treatment and Lokelma with a potassium of 3.4.  Troponin and EKG within normal limits.  Chest x-ray within normal limits.  Review of Systems: As per HPI otherwise 10 point review of systems negative.  Past Medical History:  Diagnosis Date   Arthritis    Chronic rhinitis    COPD (chronic obstructive pulmonary disease) ([place])    PFT 06-26-09 FEV1  1.75( 71%), FVC 3.65( 101%), FEV1% 48, TLC 5.53(104%), DLCO 55%, no BD   Hypertension    Nasal septal deviation    Nocturia    On home O2    2L N/C   OSA (obstructive sleep apnea) 04/21/2011    Skin cancer     Past Surgical History:  Procedure Laterality Date   APPENDECTOMY     BACK SURGERY     CATARACT EXTRACTION W/PHACO  01/06/2013   Procedure: CATARACT EXTRACTION PHACO AND INTRAOCULAR LENS PLACEMENT ([place]);  Surgeon: Tonny Branch, MD;  Location: [facility];  Service: Ophthalmology;  Laterality: Right;  CDE: 22.17     reports that he quit smoking about 19 years ago. His smoking use included cigarettes. He has a 81.00 pack-year smoking history. He has never used smokeless tobacco. He reports that he does not drink alcohol and does not use drugs.  Allergies  Allergen Reactions   Amlodipine Swelling    Swelling in feet   Aspirin Other (See Comments)    Upset stomach.   Other Hypertension    Hazelnuts   Penicillins Hives    Has patient had a PCN reaction causing immediate rash, facial/tongue/throat swelling, SOB or lightheadedness with hypotension: Yes Has patient had a PCN reaction causing severe rash involving mucus membranes or skin necrosis: No Has patient had a PCN reaction that required hospitalization No Has patient had a PCN reaction occurring within the last 10 years: No If all of the above answers are "NO", then may proceed with Cephalosporin use.    Sulfa Antibiotics Hives    Family History  Problem Relation Age of Onset   Lung cancer Mother      Prior to Admission medications  Medication Sig Start Date End Date Taking? Authorizing Provider  albuterol (PROVENTIL) (2.5 MG/3ML) 0.083% nebulizer solution TAKE 3 MLS BY NEBULIZATION EVERY 4 HOURS. AND AS NEEDED FOR SHORTNESS OF BREATH. DX: J44.9 01/01/21   [name], MD  albuterol (VENTOLIN HFA) 108 (90 Base) MCG/ACT inhaler Inhale 1-2 puffs into the lungs every 6 (six) hours as needed for wheezing or shortness of breath. 01/01/21   [name], MD  budesonide-formoterol (SYMBICORT) 160-4.5 MCG/ACT inhaler TAKE 2 PUFFS BY MOUTH TWICE A DAY 01/01/21   [name], MD  cyanocobalamin 2000 MCG tablet Take  2,000 mcg by mouth daily.    [provider]  guaiFENesin-codeine 100-10 MG/5ML syrup Take 10 mLs by mouth every 6 (six) hours as needed for cough. 09/17/21   Evalee Jefferson, PA-C  Multiple Vitamin (MULTIVITAMIN) tablet Take 1 tablet by mouth daily.    [provider]  Omega-3 Fatty Acids (FISH OIL CONCENTRATE PO) Take 1 capsule by mouth daily.    [provider]  OXYGEN Inhale 2 L into the lungs continuous.    [provider]  pyridoxine (B-6) 100 MG tablet Take 100 mg by mouth daily.    [provider]  zinc sulfate 220 (50 Zn) MG capsule Take 220 mg by mouth daily.    [provider]    Physical Exam: Vitals:   09/24/21 1510 09/24/21 1530 09/24/21 1554 09/24/21 1600  BP: 124/64 109/62  (!) 127/95  Pulse: 80 77  93  Resp: 20 20  (!) 23  Temp:      SpO2: 94% 93% 95% 95%      Constitutional: NAD, calm, comfortable Vitals:   09/24/21 1510 09/24/21 1530 09/24/21 1554 09/24/21 1600  BP: 124/64 109/62  (!) 127/95  Pulse: 80 77  93  Resp: 20 20  (!) 23  Temp:      SpO2: 94% 93% 95% 95%   Eyes: PERRL, lids and conjunctivae normal ENMT: Mucous membranes are moist. Posterior pharynx clear of any exudate or lesions.Normal dentition.  Neck: normal, supple, no masses, no thyromegaly Respiratory: clear to auscultation bilaterally, no wheezing, no crackles. Normal respiratory effort. No accessory muscle use.  Cardiovascular: Regular rate and rhythm, no murmurs / rubs / gallops. No extremity edema. 2+ pedal pulses. No carotid bruits.  Abdomen: no tenderness, no masses palpated. No hepatosplenomegaly. Bowel sounds positive.  Musculoskeletal: no clubbing / cyanosis. No joint deformity upper and lower extremities. Good ROM, no contractures. Normal muscle tone.  Skin: no rashes, lesions, ulcers. No induration Neurologic: CN 2-12 grossly intact. Sensation intact, DTR normal. Strength 5/5 in all 4.  Psychiatric: Normal judgment and insight. Alert  and oriented x 3. Normal mood.   Labs on Admission: I have personally reviewed following labs and imaging studies  CBC: Recent Labs  Lab 09/24/21 1029  WBC 10.6*  NEUTROABS 7.4  HGB 15.0  HCT 44.2  MCV 92.9  PLT 937   Basic Metabolic Panel: Recent Labs  Lab 09/24/21 1029 09/24/21 1522  NA 133*  --   K 5.8* 3.4*  CL 102  --   CO2 22  --   GLUCOSE 140*  --   BUN 22  --   CREATININE 0.89  --   CALCIUM 8.8*  --    GFR: Estimated Creatinine Clearance: 55.7 mL/min (by C-G formula based on SCr of 0.89 mg/dL). Liver Function Tests: No results for input(s): AST, ALT, ALKPHOS, BILITOT, PROT, ALBUMIN in the last 168 hours. No results for  input(s): LIPASE, AMYLASE in the last 168 hours. No results for input(s): AMMONIA in the last 168 hours. Coagulation Profile: No results for input(s): INR, PROTIME in the last 168 hours. Cardiac Enzymes: No results for input(s): CKTOTAL, CKMB, CKMBINDEX, TROPONINI in the last 168 hours. BNP (last 3 results) No results for input(s): PROBNP in the last 8760 hours. HbA1C: No results for input(s): HGBA1C in the last 72 hours. CBG: No results for input(s): GLUCAP in the last 168 hours. Lipid Profile: No results for input(s): CHOL, HDL, LDLCALC, TRIG, CHOLHDL, LDLDIRECT in the last 72 hours. Thyroid Function Tests: No results for input(s): TSH, T4TOTAL, FREET4, T3FREE, THYROIDAB in the last 72 hours. Anemia Panel: No results for input(s): VITAMINB12, FOLATE, FERRITIN, TIBC, IRON, RETICCTPCT in the last 72 hours. Urine analysis:    Component Value Date/Time   COLORURINE YELLOW 11/11/2018 1657   APPEARANCEUR CLEAR 11/11/2018 1657   LABSPEC 1.015 11/11/2018 1657   PHURINE 6.5 11/11/2018 1657   GLUCOSEU NEGATIVE 11/11/2018 1657   HGBUR NEGATIVE 11/11/2018 1657   BILIRUBINUR NEGATIVE 01/22/2016 0613   KETONESUR NEGATIVE 11/11/2018 1657   PROTEINUR NEGATIVE 11/11/2018 1657   NITRITE NEGATIVE 11/11/2018 1657   LEUKOCYTESUR NEGATIVE 11/11/2018  1657   Sepsis Labs: @LABRCNTIP (procalcitonin:4,lacticidven:4) ) Recent Results (from the past 240 hour(s))  Resp Panel by RT-PCR (Flu A&B, Covid) Nasopharyngeal Swab     Status: None   Collection Time: 09/17/21 10:39 AM   Specimen: Nasopharyngeal Swab; Nasopharyngeal(NP) swabs in vial transport medium  Result Value Ref Range Status   SARS Coronavirus 2 by RT PCR NEGATIVE NEGATIVE Final    Comment: (NOTE) SARS-CoV-2 target nucleic acids are NOT DETECTED.  The SARS-CoV-2 RNA is generally detectable in upper respiratory specimens during the acute phase of infection. The lowest concentration of SARS-CoV-2 viral copies this assay can detect is 138 copies/mL. A negative result does not preclude SARS-Cov-2 infection and should not be used as the sole basis for treatment or other patient management decisions. A negative result may occur with  improper specimen collection/handling, submission of specimen other than nasopharyngeal swab, presence of viral mutation(s) within the areas targeted by this assay, and inadequate number of viral copies(<138 copies/mL). A negative result must be combined with clinical observations, patient history, and epidemiological information. The expected result is Negative.  Fact Sheet for Patients:  EntrepreneurPulse.com.au  Fact Sheet for Healthcare Providers:  IncredibleEmployment.be  This test is no t yet approved or cleared by the [place] FDA and  has been authorized for detection and/or diagnosis of SARS-CoV-2 by FDA under an Emergency Use Authorization (EUA). This EUA will remain  in effect (meaning this test can be used) for the duration of the COVID-19 declaration under Section 564(b)(1) of the Act, 21 U.S.C.section 360bbb-3(b)(1), unless the authorization is terminated  or revoked sooner.       Influenza A by PCR NEGATIVE NEGATIVE Final   Influenza B by PCR NEGATIVE NEGATIVE Final    Comment:  (NOTE) The Xpert Xpress SARS-CoV-2/FLU/RSV plus assay is intended as an aid in the diagnosis of influenza from Nasopharyngeal swab specimens and should not be used as a sole basis for treatment. Nasal washings and aspirates are unacceptable for Xpert Xpress SARS-CoV-2/FLU/RSV testing.  Fact Sheet for Patients: EntrepreneurPulse.com.au  Fact Sheet for Healthcare Providers: IncredibleEmployment.be  This test is not yet approved or cleared by the [place] FDA and has been authorized for detection and/or diagnosis of SARS-CoV-2 by FDA under an Emergency Use Authorization (EUA). This EUA will remain in  effect (meaning this test can be used) for the duration of the COVID-19 declaration under Section 564(b)(1) of the Act, 21 U.S.C. section 360bbb-3(b)(1), unless the authorization is terminated or revoked.  Performed at [facility], [address], [place], [place] [address]   Resp Panel by RT-PCR (Flu A&B, Covid) Nasopharyngeal Swab     Status: None   Collection Time: 09/24/21 11:49 AM   Specimen: Nasopharyngeal Swab; Nasopharyngeal(NP) swabs in vial transport medium  Result Value Ref Range Status   SARS Coronavirus 2 by RT PCR NEGATIVE NEGATIVE Final    Comment: (NOTE) SARS-CoV-2 target nucleic acids are NOT DETECTED.  The SARS-CoV-2 RNA is generally detectable in upper respiratory specimens during the acute phase of infection. The lowest concentration of SARS-CoV-2 viral copies this assay can detect is 138 copies/mL. A negative result does not preclude SARS-Cov-2 infection and should not be used as the sole basis for treatment or other patient management decisions. A negative result may occur with  improper specimen collection/handling, submission of specimen other than nasopharyngeal swab, presence of viral mutation(s) within the areas targeted by this assay, and inadequate number of viral copies(<138 copies/mL). A negative result must be  combined with clinical observations, patient history, and epidemiological information. The expected result is Negative.  Fact Sheet for Patients:  EntrepreneurPulse.com.au  Fact Sheet for Healthcare Providers:  IncredibleEmployment.be  This test is no t yet approved or cleared by the [place] FDA and  has been authorized for detection and/or diagnosis of SARS-CoV-2 by FDA under an Emergency Use Authorization (EUA). This EUA will remain  in effect (meaning this test can be used) for the duration of the COVID-19 declaration under Section 564(b)(1) of the Act, 21 U.S.C.section 360bbb-3(b)(1), unless the authorization is terminated  or revoked sooner.       Influenza A by PCR NEGATIVE NEGATIVE Final   Influenza B by PCR NEGATIVE NEGATIVE Final    Comment: (NOTE) The Xpert Xpress SARS-CoV-2/FLU/RSV plus assay is intended as an aid in the diagnosis of influenza from Nasopharyngeal swab specimens and should not be used as a sole basis for treatment. Nasal washings and aspirates are unacceptable for Xpert Xpress SARS-CoV-2/FLU/RSV testing.  Fact Sheet for Patients: EntrepreneurPulse.com.au  Fact Sheet for Healthcare Providers: IncredibleEmployment.be  This test is not yet approved or cleared by the [place] FDA and has been authorized for detection and/or diagnosis of SARS-CoV-2 by FDA under an Emergency Use Authorization (EUA). This EUA will remain in effect (meaning this test can be used) for the duration of the COVID-19 declaration under Section 564(b)(1) of the Act, 21 U.S.C. section 360bbb-3(b)(1), unless the authorization is terminated or revoked.  Performed at [facility], [address] [place], [place] [address]      Radiological Exams on Admission: DG Chest 2 View  Result Date: 09/24/2021 CLINICAL DATA:  Shortness of breath, cough, congestion EXAM: CHEST - 2 VIEW COMPARISON:  Chest  radiograph and chest CT 09/17/2021 FINDINGS: The cardiomediastinal silhouette is stable. The lungs are hyperinflated consistent with underlying COPD. Linear opacities in the left midlung and right base likely reflect scar as seen on prior CT. There is no new focal airspace disease. There is no pleural effusion or pneumothorax. The bones are stable. IMPRESSION: Stable chest with no radiographic evidence of acute cardiopulmonary process. Electronically Signed   By: Valetta Mole M.D.   On: 09/24/2021 11:09    EKG: Independently reviewed. NSR  Assessment/Plan Active Problems:   Essential hypertension, benign   OSA (obstructive sleep  apnea)   COPD exacerbation (HCC)   Acute on chronic respiratory failure with hypoxia (HCC)   Hyperglycemia   Acute on chronic respiratory failure (HCC)  1-Acute on chronic resp failure w/ hypoxia Acute decompensation and chronic respiratory failure in setting of COPD exacerbation Patient on 2 L nasal cannula chronically Status post recent course of Z-Pak within the past week-unclear if patient received steroids Positive symptomatic improvement status post continuous albuterol treatment x1 Status post IV steroids in the ER Continue with supplemental oxygen IV Solu-Medrol DuoNebs IV Doxy for now as overall clinical picture is fairly reassuring De-escalate treatment as clinically appropriate  2-COPD Acute COPD flare likely secondary to weather change Status post recent course of Z-Pak outpatient with unclear use of steroids Will place on IV steroids DuoNebs as needed IV Doxy for now given overall stable clinical picture Follow  3-HTN BP stable Titrate home regimen  4-Hyperglycemia  Monitor blood sugars with recent steroid use Sliding scale insulin in the interim  DVT prophylaxis: ppx lovenox  Code Status: Full Code  Family Communication: Pt and his sister Michael Evans at the bedside.  Disposition Plan: Anticipate 1-2 [place] stay   Consults called: none   Admission status: Obs- tele   Deneise Lever MD [facility] Pager [phone]  If 7PM-7AM, please contact night-coverage [email or web address] Password [email or web address]  09/24/2021, 4:18 PM

## 2021-09-24 NOTE — Assessment & Plan Note (Signed)
Acute decompensation and chronic respiratory failure in setting of COPD exacerbation Patient on 2 L nasal cannula chronically Status post recent course of Z-Pak within the past week-unclear if patient received steroids Positive symptomatic improvement status post continuous albuterol treatment x1 Status post IV steroids in the ER Continue with supplemental oxygen IV Solu-Medrol DuoNebs IV Doxy for now as overall clinical picture is fairly reassuring De-escalate treatment as clinically appropriate

## 2021-09-24 NOTE — ED Triage Notes (Signed)
Pt reports cough and SHOB that is "getting worse than it was last week." Pt on chronic 2L.

## 2021-09-24 NOTE — Assessment & Plan Note (Signed)
BP stable Titrate home regimen 

## 2021-09-24 NOTE — Assessment & Plan Note (Signed)
Acute COPD flare likely secondary to weather change Status post recent course of Z-Pak outpatient with unclear use of steroids Will place on IV steroids DuoNebs as needed IV Doxy for now given overall stable clinical picture Follow

## 2021-09-24 NOTE — ED Provider Notes (Signed)
[facility] EMERGENCY DEPARTMENT Provider Note   CSN: [id number] Arrival date & time: 09/24/21  4034     History Chief Complaint  Patient presents with   Shortness of Breath    Michael Evans is a 80 y.o. male.  HPI   Pt is an 80 y/o male with a h/o arthritis, chronic rhinitis, COPD, HTN, nasal septal deviation, nocturia, OSA, who presents to the ED today for eval of sob that started about 1-2 weeks ago. He was seen last week for similar sxs and was started on azithromycin which did not improve symptoms. He reports increased purulent sputum and persistent cough. He wears 2L at home normally, but he is still feeling SOB on his o2. Denies fevers, chills. Denies chest pain, le swelling. Reports that the azithromycin gave him some nausea, but denies vomiting.  Past Medical History:  Diagnosis Date   Arthritis    Chronic rhinitis    COPD (chronic obstructive pulmonary disease) ([place])    PFT 06-26-09 FEV1  1.75( 71%), FVC 3.65( 101%), FEV1% 48, TLC 5.53(104%), DLCO 55%, no BD   Hypertension    Nasal septal deviation    Nocturia    On home O2    2L N/C   OSA (obstructive sleep apnea) 04/21/2011   Skin cancer     Patient Active Problem List   Diagnosis Date Noted   Tachycardia 09/26/2017   Influenza A 01/24/2016   Acute on chronic respiratory failure with hypoxia ([place]) 01/22/2016   SIRS (systemic inflammatory response syndrome) ([place]) 01/22/2016   Leukocytosis 01/22/2016   Hyperglycemia 01/22/2016   COPD exacerbation ([place]) 02/14/2015   Shingles rash 08/26/2014   Hypoxia 08/26/2014   OSA (obstructive sleep apnea) 04/21/2011   HOARSENESS 10/03/2009   RHINITIS 08/02/2009   COPD with emphysema ([place]) 07/05/2009   Essential hypertension, benign 06/07/2009    Past Surgical History:  Procedure Laterality Date   APPENDECTOMY     BACK SURGERY     CATARACT EXTRACTION W/PHACO  01/06/2013   Procedure: CATARACT EXTRACTION PHACO AND INTRAOCULAR LENS PLACEMENT ([place]);  Surgeon: Tonny Branch,  MD;  Location: [facility];  Service: Ophthalmology;  Laterality: Right;  CDE: 22.17       Family History  Problem Relation Age of Onset   Lung cancer Mother     Social History   Tobacco Use   Smoking status: Former    Packs/day: 1.50    Years: 54.00    Pack years: 81.00    Types: Cigarettes    Quit date: 12/01/2001    Years since quitting: 19.8   Smokeless tobacco: Never  Vaping Use   Vaping Use: Never used  Substance Use Topics   Alcohol use: No   Drug use: No    Home Medications Prior to Admission medications   Medication Sig Start Date End Date Taking? Authorizing Provider  albuterol (PROVENTIL) (2.5 MG/3ML) 0.083% nebulizer solution TAKE 3 MLS BY NEBULIZATION EVERY 4 HOURS. AND AS NEEDED FOR SHORTNESS OF BREATH. DX: J44.9 01/01/21   [name], MD  albuterol (VENTOLIN HFA) 108 (90 Base) MCG/ACT inhaler Inhale 1-2 puffs into the lungs every 6 (six) hours as needed for wheezing or shortness of breath. 01/01/21   [name], MD  budesonide-formoterol (SYMBICORT) 160-4.5 MCG/ACT inhaler TAKE 2 PUFFS BY MOUTH TWICE A DAY 01/01/21   [name], MD  cyanocobalamin 2000 MCG tablet Take 2,000 mcg by mouth daily.    [provider]  guaiFENesin-codeine 100-10 MG/5ML syrup Take 10  mLs by mouth every 6 (six) hours as needed for cough. 09/17/21   Evalee Jefferson, PA-C  Multiple Vitamin (MULTIVITAMIN) tablet Take 1 tablet by mouth daily.    [provider]  Omega-3 Fatty Acids (FISH OIL CONCENTRATE PO) Take 1 capsule by mouth daily.    [provider]  OXYGEN Inhale 2 L into the lungs continuous.    [provider]  pyridoxine (B-6) 100 MG tablet Take 100 mg by mouth daily.    [provider]  zinc sulfate 220 (50 Zn) MG capsule Take 220 mg by mouth daily.    [provider]    Allergies    Amlodipine, Aspirin, Other, Penicillins, and Sulfa antibiotics  Review of Systems   Review of Systems  Constitutional:  Negative for  chills and fever.  HENT:  Negative for ear pain and sore throat.   Eyes:  Negative for visual disturbance.  Respiratory:  Positive for cough and shortness of breath.   Cardiovascular:  Negative for chest pain.  Gastrointestinal:  Positive for nausea. Negative for abdominal pain, constipation, diarrhea and vomiting.  Genitourinary:  Negative for dysuria and hematuria.  Musculoskeletal:  Negative for back pain.  Skin:  Negative for rash.  Neurological:  Negative for headaches.  All other systems reviewed and are negative.  Physical Exam Updated Vital Signs BP 124/64   Pulse 80   Temp 98 F (36.7 C)   Resp 20   SpO2 95%   Physical Exam Vitals and nursing note reviewed.  Constitutional:      Appearance: He is well-developed.  HENT:     Head: Normocephalic and atraumatic.  Eyes:     Conjunctiva/sclera: Conjunctivae normal.  Cardiovascular:     Rate and Rhythm: Normal rate and regular rhythm.     Heart sounds: Normal heart sounds. No murmur heard. Pulmonary:     Effort: Pulmonary effort is normal. No respiratory distress.     Breath sounds: Examination of the right-upper field reveals wheezing. Examination of the left-upper field reveals wheezing. Examination of the right-lower field reveals rhonchi. Wheezing and rhonchi present.  Abdominal:     Palpations: Abdomen is soft.     Tenderness: There is no abdominal tenderness.  Musculoskeletal:     Cervical back: Neck supple.     Right lower leg: No tenderness. No edema.     Left lower leg: No tenderness. No edema.  Skin:    General: Skin is warm and dry.  Neurological:     Mental Status: He is alert.    ED Results / Procedures / Treatments   Labs (all labs ordered are listed, but only abnormal results are displayed) Labs Reviewed  CBC WITH DIFFERENTIAL/PLATELET - Abnormal; Notable for the following components:      Result Value   WBC 10.6 (*)    Monocytes Absolute 1.1 (*)    All other components within normal limits   BASIC METABOLIC PANEL - Abnormal; Notable for the following components:   Sodium 133 (*)    Potassium 5.8 (*)    Glucose, Bld 140 (*)    Calcium 8.8 (*)    All other components within normal limits  POTASSIUM - Abnormal; Notable for the following components:   Potassium 3.4 (*)    All other components within normal limits  RESP PANEL BY RT-PCR (FLU A&B, COVID) ARPGX2  TROPONIN I (HIGH SENSITIVITY)  TROPONIN I (HIGH SENSITIVITY)    EKG None  Radiology DG Chest 2 View  Result Date: 09/24/2021  CLINICAL DATA:  Shortness of breath, cough, congestion EXAM: CHEST - 2 VIEW COMPARISON:  Chest radiograph and chest CT 09/17/2021 FINDINGS: The cardiomediastinal silhouette is stable. The lungs are hyperinflated consistent with underlying COPD. Linear opacities in the left midlung and right base likely reflect scar as seen on prior CT. There is no new focal airspace disease. There is no pleural effusion or pneumothorax. The bones are stable. IMPRESSION: Stable chest with no radiographic evidence of acute cardiopulmonary process. Electronically Signed   By: Valetta Mole M.D.   On: 09/24/2021 11:09    Procedures Procedures   CRITICAL CARE Performed by: Rodney Booze   Total critical care time: 37 minutes  Critical care time was exclusive of separately billable procedures and treating other patients.  Critical care was necessary to treat or prevent imminent or life-threatening deterioration.  Critical care was time spent personally by me on the following activities: development of treatment plan with patient and/or surrogate as well as nursing, discussions with consultants, evaluation of patient's response to treatment, examination of patient, obtaining history from patient or surrogate, ordering and performing treatments and interventions, ordering and review of laboratory studies, ordering and review of radiographic studies, pulse oximetry and re-evaluation of patient's  condition.   Medications Ordered in ED Medications  albuterol (PROVENTIL,VENTOLIN) solution continuous neb ( Nebulization Canceled Entry 09/24/21 1554)  methylPREDNISolone sodium succinate (SOLU-MEDROL) 125 mg/2 mL injection 125 mg (125 mg Intravenous Given 09/24/21 1051)  albuterol (PROVENTIL) (2.5 MG/3ML) 0.083% nebulizer solution 5 mg (5 mg Nebulization Given 09/24/21 1048)  ipratropium (ATROVENT) nebulizer solution 0.5 mg (0.5 mg Nebulization Given 09/24/21 1048)  sodium chloride 0.9 % bolus 500 mL (0 mLs Intravenous Stopped 09/24/21 1155)  furosemide (LASIX) injection 40 mg (40 mg Intravenous Given 09/24/21 1324)  sodium chloride 0.9 % bolus 500 mL (0 mLs Intravenous Stopped 09/24/21 1454)  sodium zirconium cyclosilicate (LOKELMA) packet 10 g (10 g Oral Given 09/24/21 1317)  albuterol (PROVENTIL) (2.5 MG/3ML) 0.083% nebulizer solution 5 mg (5 mg Nebulization Given 09/24/21 1348)  ipratropium (ATROVENT) nebulizer solution 0.5 mg (0.5 mg Nebulization Given 09/24/21 1348)  albuterol (PROVENTIL) (2.5 MG/3ML) 0.083% nebulizer solution (5 mg  Given 09/24/21 1554)    ED Course  I have reviewed the triage vital signs and the nursing notes.  Pertinent labs & imaging results that were available during my care of the patient were reviewed by me and considered in my medical decision making (see chart for details).    MDM Rules/Calculators/A&P                          80 y/o M presents for eval of SOB for several weeks. Hx COPD.   Reviewed/interpreted labs CBC with mildly elevated wbc BMP with mild hyponatremia, elevated potassium  - lokelma, ivf, lasix and albuterol given, repeat k 3.4 Trop neg x2 COVID neg  EKG with NSR, LAFB, low voltage, precordial leads  Reviewed/interpreted imaging CXR - Stable chest with no radiographic evidence of acute cardiopulmonary process.  Pt was given steroids, duo nebs, ivf. On reassessment he feels only mild improvement. States he still feels sob and  is still very wheezing on exam. Will order continuous neb. He is currently on 4l. He typically requires 2l. Feel he will require admission for further tx of copd exacerbation.   4:02 PM CONSULT with Dr. Ernestina Patches with hosptialist service who accepts patient for admission    Final Clinical Impression(s) / ED Diagnoses Final diagnoses:  COPD exacerbation [facility])    Rx / DC Orders ED Discharge Orders     None        Rodney Booze, PA-C 09/24/21 1611    Lorelle Gibbs, DO 09/25/21 [id number]

## 2021-09-24 NOTE — Assessment & Plan Note (Signed)
- 

## 2021-09-24 NOTE — Assessment & Plan Note (Addendum)
Monitor blood sugars with recent steroid use Sliding scale insulin in the interim

## 2021-09-24 NOTE — ED Notes (Signed)
New urinal given

## 2021-09-24 NOTE — ED Notes (Signed)
Gave pt new urinal

## 2021-09-25 ENCOUNTER — Encounter (HOSPITAL_COMMUNITY): Payer: Self-pay | Admitting: Family Medicine

## 2021-09-25 DIAGNOSIS — J9621 Acute and chronic respiratory failure with hypoxia: Secondary | ICD-10-CM | POA: Diagnosis not present

## 2021-09-25 DIAGNOSIS — J441 Chronic obstructive pulmonary disease with (acute) exacerbation: Secondary | ICD-10-CM | POA: Diagnosis not present

## 2021-09-25 LAB — CBC WITH DIFFERENTIAL/PLATELET
Abs Immature Granulocytes: 0.17 10*3/uL — ABNORMAL HIGH (ref 0.00–0.07)
Basophils Absolute: 0 10*3/uL (ref 0.0–0.1)
Basophils Relative: 0 %
Eosinophils Absolute: 0 10*3/uL (ref 0.0–0.5)
Eosinophils Relative: 0 %
HCT: 46.1 % (ref 39.0–52.0)
Hemoglobin: 15.2 g/dL (ref 13.0–17.0)
Immature Granulocytes: 1 %
Lymphocytes Relative: 8 %
Lymphs Abs: 1.4 10*3/uL (ref 0.7–4.0)
MCH: 30.4 pg (ref 26.0–34.0)
MCHC: 33 g/dL (ref 30.0–36.0)
MCV: 92.2 fL (ref 80.0–100.0)
Monocytes Absolute: 0.4 10*3/uL (ref 0.1–1.0)
Monocytes Relative: 3 %
Neutro Abs: 15.4 10*3/uL — ABNORMAL HIGH (ref 1.7–7.7)
Neutrophils Relative %: 88 %
Platelets: 256 10*3/uL (ref 150–400)
RBC: 5 MIL/uL (ref 4.22–5.81)
RDW: 12.8 % (ref 11.5–15.5)
WBC: 17.4 10*3/uL — ABNORMAL HIGH (ref 4.0–10.5)
nRBC: 0 % (ref 0.0–0.2)

## 2021-09-25 LAB — COMPREHENSIVE METABOLIC PANEL
ALT: 29 U/L (ref 0–44)
AST: 26 U/L (ref 15–41)
Albumin: 3.6 g/dL (ref 3.5–5.0)
Alkaline Phosphatase: 62 U/L (ref 38–126)
Anion gap: 11 (ref 5–15)
BUN: 23 mg/dL (ref 8–23)
CO2: 27 mmol/L (ref 22–32)
Calcium: 9.5 mg/dL (ref 8.9–10.3)
Chloride: 101 mmol/L (ref 98–111)
Creatinine, Ser: 0.95 mg/dL (ref 0.61–1.24)
GFR, Estimated: >60 mL/min (ref >60)
Glucose, Bld: 167 mg/dL — ABNORMAL HIGH (ref 70–99)
Potassium: 4.6 mmol/L (ref 3.5–5.1)
Sodium: 139 mmol/L (ref 135–145)
Total Bilirubin: 0.9 mg/dL (ref 0.3–1.2)
Total Protein: 7.3 g/dL (ref 6.5–8.1)

## 2021-09-25 LAB — EXPECTORATED SPUTUM ASSESSMENT W GRAM STAIN, RFLX TO RESP C

## 2021-09-25 LAB — MRSA NEXT GEN BY PCR, NASAL: MRSA by PCR Next Gen: NOT DETECTED

## 2021-09-25 LAB — STREP PNEUMONIAE URINARY ANTIGEN: Strep Pneumo Urinary Antigen: NEGATIVE

## 2021-09-25 LAB — HIV ANTIBODY (ROUTINE TESTING W REFLEX): HIV Screen 4th Generation wRfx: NONREACTIVE

## 2021-09-25 MED ORDER — GUAIFENESIN ER 600 MG PO TB12
600.0000 mg | ORAL_TABLET | Freq: Two times a day (BID) | ORAL | Status: DC
  Administered 2021-09-25 – 2021-09-27 (×6): 600 mg via ORAL
  Filled 2021-09-25 (×7): qty 1

## 2021-09-25 MED ORDER — ENSURE ENLIVE PO LIQD
237.0000 mL | Freq: Three times a day (TID) | ORAL | Status: DC
  Administered 2021-09-25 – 2021-09-29 (×12): 237 mL via ORAL

## 2021-09-25 MED ORDER — IPRATROPIUM-ALBUTEROL 0.5-2.5 (3) MG/3ML IN SOLN
3.0000 mL | Freq: Three times a day (TID) | RESPIRATORY_TRACT | Status: DC
  Administered 2021-09-25 – 2021-09-28 (×9): 3 mL via RESPIRATORY_TRACT
  Filled 2021-09-25 (×9): qty 3

## 2021-09-25 MED ORDER — METHYLPREDNISOLONE SODIUM SUCC 40 MG IJ SOLR
40.0000 mg | Freq: Two times a day (BID) | INTRAMUSCULAR | Status: DC
  Administered 2021-09-25 – 2021-09-27 (×5): 40 mg via INTRAVENOUS
  Filled 2021-09-25 (×6): qty 1

## 2021-09-25 NOTE — Progress Notes (Signed)
Initial Nutrition Assessment  DOCUMENTATION CODES:      INTERVENTION:  Ensure Enlive po BID, each supplement provides 350 kcal and 20 grams of protein   Recommend continue high protein ONS after discharge BID daily.  NUTRITION DIAGNOSIS:   Inadequate oral intake related to decreased appetite as evidenced by percent weight loss, per patient/family report.   GOAL:  Patient will meet greater than or equal to 90% of their needs   MONITOR:  Labs, Weight trends  REASON FOR ASSESSMENT:   Consult Assessment of nutrition requirement/status  ASSESSMENT: Patient is an 80 yo male with hx of COPD, HTN who presents from home with acute on chronic respiratory failure.   Patient is receiving a CHO modified diet and meal intake unknown at this time. No family present. Patient is up in chair fully dressed. Patient says he has not been eating as well as he used to. We talked about his usual intake and discussed sources of protein. He likes eggs, greek yogurt, chicken and tuna.   Acute weight loss per chart review. Decrease of 5 kg x 1 week which is significant. He reports usual weight of ~ 150 lb currently weighs 138.6 lb. His pants are loose.   Medications reviewed and include: prednisone and doxycycline.   Labs: BMP Latest Ref Rng & Units 09/25/2021 09/24/2021 09/24/2021  Glucose 70 - 99 mg/dL 167(H) - 140(H)  BUN 8 - 23 mg/dL 23 - 22  Creatinine 0.61 - 1.24 mg/dL 0.95 - 0.89  BUN/Creat Ratio 6 - 22 (calc) - - -  Sodium 135 - 145 mmol/L 139 - 133(L)  Potassium 3.5 - 5.1 mmol/L 4.6 3.4(L) 5.8(H)  Chloride 98 - 111 mmol/L 101 - 102  CO2 22 - 32 mmol/L 27 - 22  Calcium 8.9 - 10.3 mg/dL 9.5 - 8.8(L)      NUTRITION - FOCUSED PHYSICAL EXAM: Unable to complete Nutrition-Focused physical exam at this time. Patient dressed.   Diet Order:   Diet Order             Diet Carb Modified Fluid consistency: Thin; Room service appropriate? Yes  Diet effective now                    EDUCATION NEEDS:  Education needs have been addressed  Skin:  Skin Assessment: Reviewed RN Assessment  Last BM:  10/25  Height:   Ht Readings from Last 1 Encounters:  09/24/21 5\' 5"  (1.651 m)    Weight:   Wt Readings from Last 1 Encounters:  09/24/21 62.6 kg    Ideal Body Weight:   62 kg  BMI:  Body mass index is 22.95 kg/m.  Estimated Nutritional Needs:   Kcal:  1900-2100  Protein:  82-88 gr  Fluid:  2 liters daily   Colman Cater MS,RD,CSG,LDN Contact: Shea Evans

## 2021-09-25 NOTE — Plan of Care (Signed)

## 2021-09-25 NOTE — Progress Notes (Signed)
PROGRESS NOTE    Michael Evans  IZT:[id number] DOB: 1941/08/22 DOA: 09/24/2021 PCP: [name], MD    Chief Complaint  Patient presents with   Shortness of Breath    Brief Narrative:   H/o copd on chronic home O2 2 L, sleep apnea, Pt was seen in the ED on 10/18 for COPD exacerbation, was given antibiotic and sent home, Pt reports cough and SOB is "getting worse than it was last week."   Subjective:  Still having a lot of productive cough, DOE, not back to baseline He showed me large amount of sputum that is in the bag  Assessment & Plan:   Active Problems:   Essential hypertension, benign   OSA (obstructive sleep apnea)   COPD exacerbation (HCC)   Acute on chronic respiratory failure with hypoxia (HCC)   Hyperglycemia   Acute on chronic respiratory failure (HCC)   COPD exacerbation/bronchitis, acute on chronic hypoxia respiratory failure, required 4 L on presentation (baseline 2 L) -COVID screening negative -Significant productive cough with DOE, currently on 3liters at rest, failed outpatient treatment ( was seen in the ED on 10/18, symptom got worse) -change steroids back to IV, check sputum culture, check MRSA , urine strep pneumo antigen, urine Legionella antigen, start scheduled nebulizer treatment, scheduled Mucinex, repeat chest x-ray if symptom does not improve  Leukocytosis Possibly steroid-induced, monitor  Hyperkalemia on presentation in the ED Labs notable for K of 5.8.  Resolved after albuterol treatment and Lokelma Monitor k   Nutritional Assessment:  The patient's BMI is: Body mass index is 22.95 kg/m.Marland Kitchen  Seen by dietician.  I agree with the assessment and plan as outlined below:  Nutrition Status: Nutrition Problem: Inadequate oral intake Etiology: decreased appetite Signs/Symptoms: percent weight loss, per patient/family report Percent weight loss: 8 % Interventions: Ensure Enlive (each supplement provides 350kcal and 20 grams of  protein) (chocolate)  .    Unresulted Labs (From admission, onward)     Start     Ordered   10/01/21 0500  Creatinine, serum  (enoxaparin (LOVENOX)    CrCl >/= 30 ml/min)  Weekly,   R     Comments: while on enoxaparin therapy    09/24/21 1615              DVT prophylaxis: enoxaparin (LOVENOX) injection 40 mg Start: 09/24/21 1800 SCDs Start: 09/24/21 1611   Code Status:full Family Communication: patient  Disposition:   Status is: Observation   Dispo: The patient is from: home, lives with sister              Anticipated d/c is to:               Anticipated d/c date is: 24-48hrs, pending cough,/doe/o2 requirement                 Consultants:  none  Procedures:  none  Antimicrobials:    Anti-infectives (From admission, onward)    Start     Dose/Rate Route Frequency Ordered Stop   09/24/21 1730  doxycycline (VIBRAMYCIN) 100 mg in sodium chloride 0.9 % 250 mL IVPB        100 mg 125 mL/hr over 120 Minutes Intravenous Every 12 hours 09/24/21 1617            Objective: Vitals:   09/25/21 0039 09/25/21 0429 09/25/21 0758 09/25/21 1306  BP: 113/61 107/63 134/70 (!) 142/70  Pulse: 80 65 75 87  Resp: 20 19 17 18   Temp: 97.7 F (36.5  C) 97.7 F (36.5 C) 97.6 F (36.4 C) 97.9 F (36.6 C)  TempSrc: Oral Oral Oral Oral  SpO2: 95% 96% 99% 91%  Weight:      Height:        Intake/Output Summary (Last 24 hours) at 09/25/2021 1334 Last data filed at 09/25/2021 1305 Gross per 24 hour  Intake 1261.3 ml  Output 2350 ml  Net -1088.7 ml   Filed Weights   09/24/21 1942  Weight: 62.6 kg    Examination:  General exam: alert, awake, weak, thin, chronically ill appearing  Respiratory system:  tight and diminished, occasional wheezing. Respiratory effort normal. Cardiovascular system:  RRR.  Gastrointestinal system: Abdomen is nondistended, soft and nontender.  Normal bowel sounds heard. Central nervous system: Alert and oriented. No focal neurological  deficits. Extremities:  no edema Skin: No rashes, lesions or ulcers Psychiatry: Judgement and insight appear normal. Mood & affect appropriate.     Data Reviewed: I have personally reviewed following labs and imaging studies  CBC: Recent Labs  Lab 09/24/21 1029 09/25/21 0553  WBC 10.6* 17.4*  NEUTROABS 7.4 15.4*  HGB 15.0 15.2  HCT 44.2 46.1  MCV 92.9 92.2  PLT 232 213    Basic Metabolic Panel: Recent Labs  Lab 09/24/21 1029 09/24/21 1522 09/25/21 0553  NA 133*  --  139  K 5.8* 3.4* 4.6  CL 102  --  101  CO2 22  --  27  GLUCOSE 140*  --  167*  BUN 22  --  23  CREATININE 0.89  --  0.95  CALCIUM 8.8*  --  9.5    GFR: Estimated Creatinine Clearance: 52.1 mL/min (by C-G formula based on SCr of 0.95 mg/dL).  Liver Function Tests: Recent Labs  Lab 09/25/21 0553  AST 26  ALT 29  ALKPHOS 62  BILITOT 0.9  PROT 7.3  ALBUMIN 3.6    CBG: No results for input(s): GLUCAP in the last 168 hours.   Recent Results (from the past 240 hour(s))  Resp Panel by RT-PCR (Flu A&B, Covid) Nasopharyngeal Swab     Status: None   Collection Time: 09/17/21 10:39 AM   Specimen: Nasopharyngeal Swab; Nasopharyngeal(NP) swabs in vial transport medium  Result Value Ref Range Status   SARS Coronavirus 2 by RT PCR NEGATIVE NEGATIVE Final    Comment: (NOTE) SARS-CoV-2 target nucleic acids are NOT DETECTED.  The SARS-CoV-2 RNA is generally detectable in upper respiratory specimens during the acute phase of infection. The lowest concentration of SARS-CoV-2 viral copies this assay can detect is 138 copies/mL. A negative result does not preclude SARS-Cov-2 infection and should not be used as the sole basis for treatment or other patient management decisions. A negative result may occur with  improper specimen collection/handling, submission of specimen other than nasopharyngeal swab, presence of viral mutation(s) within the areas targeted by this assay, and inadequate number of  viral copies(<138 copies/mL). A negative result must be combined with clinical observations, patient history, and epidemiological information. The expected result is Negative.  Fact Sheet for Patients:  EntrepreneurPulse.com.au  Fact Sheet for Healthcare Providers:  IncredibleEmployment.be  This test is no t yet approved or cleared by the [place] FDA and  has been authorized for detection and/or diagnosis of SARS-CoV-2 by FDA under an Emergency Use Authorization (EUA). This EUA will remain  in effect (meaning this test can be used) for the duration of the COVID-19 declaration under Section 564(b)(1) of the Act, 21 U.S.C.section 360bbb-3(b)(1), unless the authorization is  terminated  or revoked sooner.       Influenza A by PCR NEGATIVE NEGATIVE Final   Influenza B by PCR NEGATIVE NEGATIVE Final    Comment: (NOTE) The Xpert Xpress SARS-CoV-2/FLU/RSV plus assay is intended as an aid in the diagnosis of influenza from Nasopharyngeal swab specimens and should not be used as a sole basis for treatment. Nasal washings and aspirates are unacceptable for Xpert Xpress SARS-CoV-2/FLU/RSV testing.  Fact Sheet for Patients: EntrepreneurPulse.com.au  Fact Sheet for Healthcare Providers: IncredibleEmployment.be  This test is not yet approved or cleared by the [place] FDA and has been authorized for detection and/or diagnosis of SARS-CoV-2 by FDA under an Emergency Use Authorization (EUA). This EUA will remain in effect (meaning this test can be used) for the duration of the COVID-19 declaration under Section 564(b)(1) of the Act, 21 U.S.C. section 360bbb-3(b)(1), unless the authorization is terminated or revoked.  Performed at [facility], [address] [place], [place] [address]   Resp Panel by RT-PCR (Flu A&B, Covid) Nasopharyngeal Swab     Status: None   Collection Time: 09/24/21 11:49 AM    Specimen: Nasopharyngeal Swab; Nasopharyngeal(NP) swabs in vial transport medium  Result Value Ref Range Status   SARS Coronavirus 2 by RT PCR NEGATIVE NEGATIVE Final    Comment: (NOTE) SARS-CoV-2 target nucleic acids are NOT DETECTED.  The SARS-CoV-2 RNA is generally detectable in upper respiratory specimens during the acute phase of infection. The lowest concentration of SARS-CoV-2 viral copies this assay can detect is 138 copies/mL. A negative result does not preclude SARS-Cov-2 infection and should not be used as the sole basis for treatment or other patient management decisions. A negative result may occur with  improper specimen collection/handling, submission of specimen other than nasopharyngeal swab, presence of viral mutation(s) within the areas targeted by this assay, and inadequate number of viral copies(<138 copies/mL). A negative result must be combined with clinical observations, patient history, and epidemiological information. The expected result is Negative.  Fact Sheet for Patients:  EntrepreneurPulse.com.au  Fact Sheet for Healthcare Providers:  IncredibleEmployment.be  This test is no t yet approved or cleared by the [place] FDA and  has been authorized for detection and/or diagnosis of SARS-CoV-2 by FDA under an Emergency Use Authorization (EUA). This EUA will remain  in effect (meaning this test can be used) for the duration of the COVID-19 declaration under Section 564(b)(1) of the Act, 21 U.S.C.section 360bbb-3(b)(1), unless the authorization is terminated  or revoked sooner.       Influenza A by PCR NEGATIVE NEGATIVE Final   Influenza B by PCR NEGATIVE NEGATIVE Final    Comment: (NOTE) The Xpert Xpress SARS-CoV-2/FLU/RSV plus assay is intended as an aid in the diagnosis of influenza from Nasopharyngeal swab specimens and should not be used as a sole basis for treatment. Nasal washings and aspirates are  unacceptable for Xpert Xpress SARS-CoV-2/FLU/RSV testing.  Fact Sheet for Patients: EntrepreneurPulse.com.au  Fact Sheet for Healthcare Providers: IncredibleEmployment.be  This test is not yet approved or cleared by the [place] FDA and has been authorized for detection and/or diagnosis of SARS-CoV-2 by FDA under an Emergency Use Authorization (EUA). This EUA will remain in effect (meaning this test can be used) for the duration of the COVID-19 declaration under Section 564(b)(1) of the Act, 21 U.S.C. section 360bbb-3(b)(1), unless the authorization is terminated or revoked.  Performed at [facility], [address] [place], [place] [address]  Radiology Studies: DG Chest 2 View  Result Date: 09/24/2021 CLINICAL DATA:  Shortness of breath, cough, congestion EXAM: CHEST - 2 VIEW COMPARISON:  Chest radiograph and chest CT 09/17/2021 FINDINGS: The cardiomediastinal silhouette is stable. The lungs are hyperinflated consistent with underlying COPD. Linear opacities in the left midlung and right base likely reflect scar as seen on prior CT. There is no new focal airspace disease. There is no pleural effusion or pneumothorax. The bones are stable. IMPRESSION: Stable chest with no radiographic evidence of acute cardiopulmonary process. Electronically Signed   By: Valetta Mole M.D.   On: 09/24/2021 11:09        Scheduled Meds:  enoxaparin (LOVENOX) injection  40 mg Subcutaneous Q24H   feeding supplement  237 mL Oral TID BM   [START ON 09/26/2021] predniSONE  40 mg Oral Q breakfast   Continuous Infusions:  doxycycline (VIBRAMYCIN) IV 100 mg (09/25/21 0544)     LOS: 0 days   Time spent: 33mins Greater than 50% of this time was spent in counseling, explanation of diagnosis, planning of further management, and coordination of care.   Voice Recognition Viviann Spare dictation system was used to create this note, attempts have been made to  correct errors. Please contact the author with questions and/or clarifications.   Florencia Reasons, MD PhD FACP [facility]  Available via Epic secure chat 7am-7pm for nonurgent issues Please page for urgent issues To page the attending provider between 7A-7P or the covering provider during after hours 7P-7A, please log into the web site [email or web address] and access using universal [facility] password for that web site. If you do not have the password, please call the hospital operator.    09/25/2021, 1:34 PM

## 2021-09-25 NOTE — Progress Notes (Signed)
PT Cancellation Note  Patient Details Name: Michael Evans MRN: [id number] DOB: 1941/04/01   Cancelled Treatment:    Reason Eval/Treat Not Completed: PT screened, no needs identified, will sign off   11:10 AM, 09/25/21 Lonell Grandchild, MPT Physical Therapist with [facility] [phone] office [phone] mobile phone

## 2021-09-26 ENCOUNTER — Inpatient Hospital Stay (HOSPITAL_COMMUNITY): Payer: Medicare HMO

## 2021-09-26 DIAGNOSIS — Z9981 Dependence on supplemental oxygen: Secondary | ICD-10-CM | POA: Diagnosis not present

## 2021-09-26 DIAGNOSIS — J9621 Acute and chronic respiratory failure with hypoxia: Secondary | ICD-10-CM | POA: Diagnosis present

## 2021-09-26 DIAGNOSIS — J441 Chronic obstructive pulmonary disease with (acute) exacerbation: Secondary | ICD-10-CM | POA: Diagnosis not present

## 2021-09-26 DIAGNOSIS — Z801 Family history of malignant neoplasm of trachea, bronchus and lung: Secondary | ICD-10-CM | POA: Diagnosis not present

## 2021-09-26 DIAGNOSIS — Z7951 Long term (current) use of inhaled steroids: Secondary | ICD-10-CM | POA: Diagnosis not present

## 2021-09-26 DIAGNOSIS — J440 Chronic obstructive pulmonary disease with acute lower respiratory infection: Secondary | ICD-10-CM | POA: Diagnosis present

## 2021-09-26 DIAGNOSIS — E875 Hyperkalemia: Secondary | ICD-10-CM | POA: Diagnosis present

## 2021-09-26 DIAGNOSIS — J449 Chronic obstructive pulmonary disease, unspecified: Secondary | ICD-10-CM | POA: Diagnosis present

## 2021-09-26 DIAGNOSIS — J439 Emphysema, unspecified: Secondary | ICD-10-CM | POA: Diagnosis not present

## 2021-09-26 DIAGNOSIS — Z79899 Other long term (current) drug therapy: Secondary | ICD-10-CM | POA: Diagnosis not present

## 2021-09-26 DIAGNOSIS — I10 Essential (primary) hypertension: Secondary | ICD-10-CM | POA: Diagnosis present

## 2021-09-26 DIAGNOSIS — Z85828 Personal history of other malignant neoplasm of skin: Secondary | ICD-10-CM | POA: Diagnosis not present

## 2021-09-26 DIAGNOSIS — R0600 Dyspnea, unspecified: Secondary | ICD-10-CM | POA: Diagnosis not present

## 2021-09-26 DIAGNOSIS — R739 Hyperglycemia, unspecified: Secondary | ICD-10-CM | POA: Diagnosis present

## 2021-09-26 DIAGNOSIS — G4733 Obstructive sleep apnea (adult) (pediatric): Secondary | ICD-10-CM | POA: Diagnosis not present

## 2021-09-26 DIAGNOSIS — R059 Cough, unspecified: Secondary | ICD-10-CM | POA: Diagnosis not present

## 2021-09-26 DIAGNOSIS — J209 Acute bronchitis, unspecified: Secondary | ICD-10-CM | POA: Diagnosis present

## 2021-09-26 DIAGNOSIS — Z87891 Personal history of nicotine dependence: Secondary | ICD-10-CM | POA: Diagnosis not present

## 2021-09-26 DIAGNOSIS — Z20822 Contact with and (suspected) exposure to covid-19: Secondary | ICD-10-CM | POA: Diagnosis present

## 2021-09-26 LAB — CBC WITH DIFFERENTIAL/PLATELET
Abs Immature Granulocytes: 0.23 10*3/uL — ABNORMAL HIGH (ref 0.00–0.07)
Basophils Absolute: 0 10*3/uL (ref 0.0–0.1)
Basophils Relative: 0 %
Eosinophils Absolute: 0 10*3/uL (ref 0.0–0.5)
Eosinophils Relative: 0 %
HCT: 42 % (ref 39.0–52.0)
Hemoglobin: 13.8 g/dL (ref 13.0–17.0)
Immature Granulocytes: 1 %
Lymphocytes Relative: 5 %
Lymphs Abs: 1.1 10*3/uL (ref 0.7–4.0)
MCH: 30.6 pg (ref 26.0–34.0)
MCHC: 32.9 g/dL (ref 30.0–36.0)
MCV: 93.1 fL (ref 80.0–100.0)
Monocytes Absolute: 1.1 10*3/uL — ABNORMAL HIGH (ref 0.1–1.0)
Monocytes Relative: 5 %
Neutro Abs: 18.6 10*3/uL — ABNORMAL HIGH (ref 1.7–7.7)
Neutrophils Relative %: 89 %
Platelets: 268 10*3/uL (ref 150–400)
RBC: 4.51 MIL/uL (ref 4.22–5.81)
RDW: 13.2 % (ref 11.5–15.5)
WBC: 21 10*3/uL — ABNORMAL HIGH (ref 4.0–10.5)
nRBC: 0 % (ref 0.0–0.2)

## 2021-09-26 LAB — HEMOGLOBIN A1C
Hgb A1c MFr Bld: 6.7 % — ABNORMAL HIGH (ref 4.8–5.6)
Mean Plasma Glucose: 145.59 mg/dL

## 2021-09-26 LAB — LEGIONELLA PNEUMOPHILA SEROGP 1 UR AG: L. pneumophila Serogp 1 Ur Ag: NEGATIVE

## 2021-09-26 LAB — BASIC METABOLIC PANEL
Anion gap: 8 (ref 5–15)
BUN: 28 mg/dL — ABNORMAL HIGH (ref 8–23)
CO2: 27 mmol/L (ref 22–32)
Calcium: 9.3 mg/dL (ref 8.9–10.3)
Chloride: 103 mmol/L (ref 98–111)
Creatinine, Ser: 0.83 mg/dL (ref 0.61–1.24)
GFR, Estimated: >60 mL/min (ref >60)
Glucose, Bld: 174 mg/dL — ABNORMAL HIGH (ref 70–99)
Potassium: 3.8 mmol/L (ref 3.5–5.1)
Sodium: 138 mmol/L (ref 135–145)

## 2021-09-26 LAB — MAGNESIUM: Magnesium: 2.1 mg/dL (ref 1.7–2.4)

## 2021-09-26 MED ORDER — SODIUM CHLORIDE 3 % IN NEBU
4.0000 mL | INHALATION_SOLUTION | Freq: Every day | RESPIRATORY_TRACT | Status: AC
  Administered 2021-09-26 – 2021-09-28 (×3): 4 mL via RESPIRATORY_TRACT
  Filled 2021-09-26 (×3): qty 4

## 2021-09-26 NOTE — Progress Notes (Signed)
PROGRESS NOTE    Michael Evans  XTG:[id number] DOB: January 24, 1941 DOA: 09/24/2021 PCP: [name], MD    Chief Complaint  Patient presents with   Shortness of Breath    Brief Narrative:   H/o copd on chronic home O2 2 L, sleep apnea, Pt was seen in the ED on 10/18 for COPD exacerbation, was given antibiotic and sent home, Pt reports cough and SOB is "getting worse than it was last week."   Subjective:  Still having a lot of productive cough, DOE, not back to baseline, feeling about the same as of yesterday.  Denies chest pain  Assessment & Plan:   Active Problems:   Essential hypertension, benign   OSA (obstructive sleep apnea)   COPD exacerbation (HCC)   Acute on chronic respiratory failure with hypoxia (HCC)   Hyperglycemia   Acute on chronic respiratory failure (HCC)   COPD (chronic obstructive pulmonary disease) (HCC)   COPD exacerbation/bronchitis, acute on chronic hypoxia respiratory failure, required 4 L on presentation (baseline 2 L) -COVID screening negative -Significant productive cough with DOE, currently on 3liters at rest, failed outpatient treatment ( was seen in the ED on 10/18, symptom got worse) -ct chest obtained on 10/18 showed diffuse bronchitis/trachea and endocronchial debris,  -no improvement in respiratory symptoms, continue iv steroids, follow up on sputum culture, MRSA screening negative , urine strep pneumo antigen negative, urine Legionella antigen in process, start scheduled nebulizer treatment, scheduled Mucinex, repeat chest x-ray due to persistent symptoms -add on hypertonic saline, chest PT -consider pulm consult if no improvement of symptom tomorrow  Leukocytosis Possibly steroid-induced, monitor  Hyperkalemia on presentation in the ED Labs notable for K of 5.8.  Resolved after albuterol treatment and Lokelma Monitor k   Nutritional Assessment:  The patient's BMI is: Body mass index is 22.95 kg/m.Marland Kitchen  Seen by dietician.  I  agree with the assessment and plan as outlined below:  Nutrition Status: Nutrition Problem: Inadequate oral intake Etiology: decreased appetite Signs/Symptoms: percent weight loss, per patient/family report Percent weight loss: 8 % Interventions: Ensure Enlive (each supplement provides 350kcal and 20 grams of protein) (chocolate)  .    Unresulted Labs (From admission, onward)     Start     Ordered   10/01/21 0500  Creatinine, serum  (enoxaparin (LOVENOX)    CrCl >/= 30 ml/min)  Weekly,   R     Comments: while on enoxaparin therapy    09/24/21 1615   09/25/21 1448  Legionella Pneumophila Serogp 1 Ur Ag  Once,   R        09/25/21 1447              DVT prophylaxis: enoxaparin (LOVENOX) injection 40 mg Start: 09/24/21 1800 SCDs Start: 09/24/21 1611   Code Status:full Family Communication: patient  Disposition:   Status is: inpatient   Dispo: The patient is from: home, lives with sister              Anticipated d/c is to:               Anticipated d/c date is: 24-48hrs, pending cough,/doe/o2 requirement , may need pulmonary consult if symptom does not improve                Consultants:  none  Procedures:  none  Antimicrobials:    Anti-infectives (From admission, onward)    Start     Dose/Rate Route Frequency Ordered Stop   09/24/21 1730  doxycycline (VIBRAMYCIN) 100  mg in sodium chloride 0.9 % 250 mL IVPB        100 mg 125 mL/hr over 120 Minutes Intravenous Every 12 hours 09/24/21 1617            Objective: Vitals:   09/25/21 1959 09/25/21 2128 09/26/21 0450 09/26/21 0801  BP:  122/65 107/88   Pulse:  89 (!) 53   Resp:  18 17   Temp:  98.1 F (36.7 C) 98 F (36.7 C)   TempSrc:  Oral    SpO2: 93% 95% 98% 98%  Weight:      Height:        Intake/Output Summary (Last 24 hours) at 09/26/2021 1158 Last data filed at 09/26/2021 0900 Gross per 24 hour  Intake 680 ml  Output 950 ml  Net -270 ml   Filed Weights   09/24/21 1942  Weight: 62.6  kg    Examination:  General exam: alert, awake, weak, thin, chronically ill appearing  Respiratory system:  tight and diminished, occasional wheezing.  Rhonchi, Respiratory effort normal. Cardiovascular system:  RRR.  Gastrointestinal system: Abdomen is nondistended, soft and nontender.  Normal bowel sounds heard. Central nervous system: Alert and oriented. No focal neurological deficits. Extremities:  no edema Skin: No rashes, lesions or ulcers Psychiatry: Judgement and insight appear normal. Mood & affect appropriate.     Data Reviewed: I have personally reviewed following labs and imaging studies  CBC: Recent Labs  Lab 09/24/21 1029 09/25/21 0553 09/26/21 0538  WBC 10.6* 17.4* 21.0*  NEUTROABS 7.4 15.4* 18.6*  HGB 15.0 15.2 13.8  HCT 44.2 46.1 42.0  MCV 92.9 92.2 93.1  PLT 232 256 294    Basic Metabolic Panel: Recent Labs  Lab 09/24/21 1029 09/24/21 1522 09/25/21 0553 09/26/21 0538  NA 133*  --  139 138  K 5.8* 3.4* 4.6 3.8  CL 102  --  101 103  CO2 22  --  27 27  GLUCOSE 140*  --  167* 174*  BUN 22  --  23 28*  CREATININE 0.89  --  0.95 0.83  CALCIUM 8.8*  --  9.5 9.3  MG  --   --   --  2.1    GFR: Estimated Creatinine Clearance: 59.7 mL/min (by C-G formula based on SCr of 0.83 mg/dL).  Liver Function Tests: Recent Labs  Lab 09/25/21 0553  AST 26  ALT 29  ALKPHOS 62  BILITOT 0.9  PROT 7.3  ALBUMIN 3.6    CBG: No results for input(s): GLUCAP in the last 168 hours.   Recent Results (from the past 240 hour(s))  Resp Panel by RT-PCR (Flu A&B, Covid) Nasopharyngeal Swab     Status: None   Collection Time: 09/17/21 10:39 AM   Specimen: Nasopharyngeal Swab; Nasopharyngeal(NP) swabs in vial transport medium  Result Value Ref Range Status   SARS Coronavirus 2 by RT PCR NEGATIVE NEGATIVE Final    Comment: (NOTE) SARS-CoV-2 target nucleic acids are NOT DETECTED.  The SARS-CoV-2 RNA is generally detectable in upper respiratory specimens during  the acute phase of infection. The lowest concentration of SARS-CoV-2 viral copies this assay can detect is 138 copies/mL. A negative result does not preclude SARS-Cov-2 infection and should not be used as the sole basis for treatment or other patient management decisions. A negative result may occur with  improper specimen collection/handling, submission of specimen other than nasopharyngeal swab, presence of viral mutation(s) within the areas targeted by this assay, and inadequate number of viral copies(<138  copies/mL). A negative result must be combined with clinical observations, patient history, and epidemiological information. The expected result is Negative.  Fact Sheet for Patients:  EntrepreneurPulse.com.au  Fact Sheet for Healthcare Providers:  IncredibleEmployment.be  This test is no t yet approved or cleared by the [place] FDA and  has been authorized for detection and/or diagnosis of SARS-CoV-2 by FDA under an Emergency Use Authorization (EUA). This EUA will remain  in effect (meaning this test can be used) for the duration of the COVID-19 declaration under Section 564(b)(1) of the Act, 21 U.S.C.section 360bbb-3(b)(1), unless the authorization is terminated  or revoked sooner.       Influenza A by PCR NEGATIVE NEGATIVE Final   Influenza B by PCR NEGATIVE NEGATIVE Final    Comment: (NOTE) The Xpert Xpress SARS-CoV-2/FLU/RSV plus assay is intended as an aid in the diagnosis of influenza from Nasopharyngeal swab specimens and should not be used as a sole basis for treatment. Nasal washings and aspirates are unacceptable for Xpert Xpress SARS-CoV-2/FLU/RSV testing.  Fact Sheet for Patients: EntrepreneurPulse.com.au  Fact Sheet for Healthcare Providers: IncredibleEmployment.be  This test is not yet approved or cleared by the [place] FDA and has been authorized for detection and/or  diagnosis of SARS-CoV-2 by FDA under an Emergency Use Authorization (EUA). This EUA will remain in effect (meaning this test can be used) for the duration of the COVID-19 declaration under Section 564(b)(1) of the Act, 21 U.S.C. section 360bbb-3(b)(1), unless the authorization is terminated or revoked.  Performed at [facility], [address] [place], [place] [address]   Resp Panel by RT-PCR (Flu A&B, Covid) Nasopharyngeal Swab     Status: None   Collection Time: 09/24/21 11:49 AM   Specimen: Nasopharyngeal Swab; Nasopharyngeal(NP) swabs in vial transport medium  Result Value Ref Range Status   SARS Coronavirus 2 by RT PCR NEGATIVE NEGATIVE Final    Comment: (NOTE) SARS-CoV-2 target nucleic acids are NOT DETECTED.  The SARS-CoV-2 RNA is generally detectable in upper respiratory specimens during the acute phase of infection. The lowest concentration of SARS-CoV-2 viral copies this assay can detect is 138 copies/mL. A negative result does not preclude SARS-Cov-2 infection and should not be used as the sole basis for treatment or other patient management decisions. A negative result may occur with  improper specimen collection/handling, submission of specimen other than nasopharyngeal swab, presence of viral mutation(s) within the areas targeted by this assay, and inadequate number of viral copies(<138 copies/mL). A negative result must be combined with clinical observations, patient history, and epidemiological information. The expected result is Negative.  Fact Sheet for Patients:  EntrepreneurPulse.com.au  Fact Sheet for Healthcare Providers:  IncredibleEmployment.be  This test is no t yet approved or cleared by the [place] FDA and  has been authorized for detection and/or diagnosis of SARS-CoV-2 by FDA under an Emergency Use Authorization (EUA). This EUA will remain  in effect (meaning this test can be used) for the duration of  the COVID-19 declaration under Section 564(b)(1) of the Act, 21 U.S.C.section 360bbb-3(b)(1), unless the authorization is terminated  or revoked sooner.       Influenza A by PCR NEGATIVE NEGATIVE Final   Influenza B by PCR NEGATIVE NEGATIVE Final    Comment: (NOTE) The Xpert Xpress SARS-CoV-2/FLU/RSV plus assay is intended as an aid in the diagnosis of influenza from Nasopharyngeal swab specimens and should not be used as a sole basis for treatment. Nasal washings and aspirates are unacceptable for Xpert Xpress SARS-CoV-2/FLU/RSV testing.  Fact Sheet for Patients: EntrepreneurPulse.com.au  Fact Sheet for Healthcare Providers: IncredibleEmployment.be  This test is not yet approved or cleared by the [place] FDA and has been authorized for detection and/or diagnosis of SARS-CoV-2 by FDA under an Emergency Use Authorization (EUA). This EUA will remain in effect (meaning this test can be used) for the duration of the COVID-19 declaration under Section 564(b)(1) of the Act, 21 U.S.C. section 360bbb-3(b)(1), unless the authorization is terminated or revoked.  Performed at [facility], [address], [place], [place] [address]   MRSA Next Gen by PCR, Nasal     Status: None   Collection Time: 09/25/21  6:27 PM   Specimen: Nasal Mucosa; Nasal Swab  Result Value Ref Range Status   MRSA by PCR Next Gen NOT DETECTED NOT DETECTED Final    Comment: (NOTE) The GeneXpert MRSA Assay (FDA approved for NASAL specimens only), is one component of a comprehensive MRSA colonization surveillance program. It is not intended to diagnose MRSA infection nor to guide or monitor treatment for MRSA infections. Test performance is not FDA approved in patients less than 59 years old. Performed at [facility], [address] [place], [place] [address]   Expectorated Sputum Assessment w Gram Stain, Rflx to Resp Cult     Status: None   Collection Time: 09/25/21   8:15 PM   Specimen: Expectorated Sputum  Result Value Ref Range Status   Specimen Description EXPECTORATED SPUTUM  Final   Special Requests NONE  Final   Sputum evaluation   Final    THIS SPECIMEN IS ACCEPTABLE FOR SPUTUM CULTURE PERFORMED AT [facility] Performed at [facility], [address], [place], [place] [address]    Report Status 09/25/2021 FINAL  Final  Culture, Respiratory w Gram Stain     Status: None (Preliminary result)   Collection Time: 09/25/21  8:15 PM  Result Value Ref Range Status   Specimen Description   Final    EXPECTORATED SPUTUM Performed at [facility], [address], [place], [place] [address]    Special Requests   Final    NONE Reflexed from [id number] Performed at [facility], [address], [place], [place] [address]    Gram Stain   Final    FEW SQUAMOUS EPITHELIAL CELLS PRESENT RARE WBC PRESENT, PREDOMINANTLY PMN MODERATE GRAM POSITIVE COCCI FEW GRAM POSITIVE RODS Performed at [facility], [place] [address], [place], [place] [address]    Culture PENDING  Incomplete   Report Status PENDING  Incomplete         Radiology Studies: No results found.      Scheduled Meds:  enoxaparin (LOVENOX) injection  40 mg Subcutaneous Q24H   feeding supplement  237 mL Oral TID BM   guaiFENesin  600 mg Oral BID   ipratropium-albuterol  3 mL Nebulization TID   methylPREDNISolone (SOLU-MEDROL) injection  40 mg Intravenous Q12H   Continuous Infusions:  doxycycline (VIBRAMYCIN) IV 100 mg (09/26/21 0504)     LOS: 0 days   Time spent: 29mins Greater than 50% of this time was spent in counseling, explanation of diagnosis, planning of further management, and coordination of care.   Voice Recognition Viviann Spare dictation system was used to create this note, attempts have been made to correct errors. Please contact the author with questions and/or clarifications.   Florencia Reasons, MD PhD FACP [facility]  Available via Epic secure chat 7am-7pm for nonurgent  issues Please page for urgent issues To page the attending provider between  7A-7P or the covering provider during after hours 7P-7A, please log into the web site [email or web address] and access using universal [place] password for that web site. If you do not have the password, please call the hospital operator.    09/26/2021, 11:58 AM

## 2021-09-26 NOTE — Progress Notes (Signed)
OT Cancellation Note  Patient Details Name: Michael Evans MRN: [id number] DOB: [birth year]   Cancelled Treatment:    Reason Eval/Treat Not Completed: OT screened, no needs identified, will sign off. Pt seen by PT yesterday. Discussed pt status and agreed PT does not need OT services based on PT's observation.   Barack Nicodemus OT, MOT   Larey Seat 09/26/2021, 8:09 AM

## 2021-09-27 DIAGNOSIS — J441 Chronic obstructive pulmonary disease with (acute) exacerbation: Secondary | ICD-10-CM | POA: Diagnosis not present

## 2021-09-27 DIAGNOSIS — J9621 Acute and chronic respiratory failure with hypoxia: Secondary | ICD-10-CM | POA: Diagnosis not present

## 2021-09-27 NOTE — Progress Notes (Signed)
PROGRESS NOTE    Michael Evans  WNI:[id number] DOB: [birth year] DOA: 09/24/2021 PCP: [name], MD    Chief Complaint  Patient presents with   Shortness of Breath    Brief Narrative:   H/o copd on chronic home O2 2 L, sleep apnea, Pt was seen in the ED on 10/18 for COPD exacerbation, was given antibiotic and sent home, Pt reports cough and SOB is "getting worse than it was last week."   Subjective:   Started to feel better, reports hypertonic saline nebs seems to help , it is easier for him to cough up,   Assessment & Plan:   Active Problems:   Essential hypertension, benign   OSA (obstructive sleep apnea)   COPD exacerbation (HCC)   Acute on chronic respiratory failure with hypoxia (HCC)   Hyperglycemia   Acute on chronic respiratory failure (HCC)   COPD (chronic obstructive pulmonary disease) (HCC)   COPD exacerbation/bronchitis, acute on chronic hypoxia respiratory failure, required 4 L on presentation (baseline 2 L) -COVID screening negative -Significant productive cough with DOE, currently on 3liters at rest, failed outpatient treatment ( was seen in the ED on 10/18, symptom got worse) -ct chest obtained on 10/18 showed diffuse bronchitis/trachea and endocronchial debris,  -some improvement in respiratory symptoms, continue iv steroids, follow up on sputum culture, MRSA screening negative , urine strep pneumo antigen negative, urine Legionella antigen negative,  -Continue scheduled nebulizer treatment, scheduled Mucinex, repeat chest x-ray due to persistent symptoms no significant interval changes -Continue hypertonic saline, chest PT -Start to feel better  Leukocytosis Possibly steroid-induced, monitor  Hyperkalemia on presentation in the ED Labs notable for K of 5.8.  Resolved after albuterol treatment and Lokelma Monitor k   Nutritional Assessment:  The patient's BMI is: Body mass index is 22.95 kg/m.Marland Kitchen  Seen by dietician.  I agree with the  assessment and plan as outlined below:  Nutrition Status: Nutrition Problem: Inadequate oral intake Etiology: decreased appetite Signs/Symptoms: percent weight loss, per patient/family report Percent weight loss: 8 % Interventions: Ensure Enlive (each supplement provides 350kcal and 20 grams of protein) (chocolate)  .    Unresulted Labs (From admission, onward)     Start     Ordered   10/01/21 0500  Creatinine, serum  (enoxaparin (LOVENOX)    CrCl >/= 30 ml/min)  Weekly,   R     Comments: while on enoxaparin therapy    09/24/21 1615              DVT prophylaxis: enoxaparin (LOVENOX) injection 40 mg Start: 09/24/21 1800 SCDs Start: 09/24/21 1611   Code Status:full Family Communication: patient  Disposition:   Status is: inpatient   Dispo: The patient is from: home, lives with sister              Anticipated d/c is to:               Anticipated d/c date is: 24-48hrs, pending cough,/doe/o2 requirement , may need pulmonary consult if symptom does not improve                Consultants:  none  Procedures:  none  Antimicrobials:    Anti-infectives (From admission, onward)    Start     Dose/Rate Route Frequency Ordered Stop   09/24/21 1730  doxycycline (VIBRAMYCIN) 100 mg in sodium chloride 0.9 % 250 mL IVPB        100 mg 125 mL/hr over 120 Minutes Intravenous Every 12 hours 09/24/21  1617            Objective: Vitals:   09/26/21 1442 09/26/21 2047 09/26/21 2127 09/27/21 0524  BP:   118/63 121/61  Pulse:   68 (!) 50  Resp:   17 20  Temp:   97.8 F (36.6 C) (!) 97.5 F (36.4 C)  TempSrc:   Oral Oral  SpO2: 100% 93% 99% 92%  Weight:      Height:        Intake/Output Summary (Last 24 hours) at 09/27/2021 0855 Last data filed at 09/27/2021 0500 Gross per 24 hour  Intake 720 ml  Output 450 ml  Net 270 ml   Filed Weights   09/24/21 1942  Weight: 62.6 kg    Examination:  General exam: alert, awake, weak, thin, chronically ill appearing   Respiratory system: Right lung aeration seems has improved some , left lung still tight tight, occasional wheezing/ Rhonchi, Respiratory effort normal. Cardiovascular system:  RRR.  Gastrointestinal system: Abdomen is nondistended, soft and nontender.  Normal bowel sounds heard. Central nervous system: Alert and oriented. No focal neurological deficits. Extremities:  no edema Skin: No rashes, lesions or ulcers Psychiatry: Judgement and insight appear normal. Mood & affect appropriate.     Data Reviewed: I have personally reviewed following labs and imaging studies  CBC: Recent Labs  Lab 09/24/21 1029 09/25/21 0553 09/26/21 0538  WBC 10.6* 17.4* 21.0*  NEUTROABS 7.4 15.4* 18.6*  HGB 15.0 15.2 13.8  HCT 44.2 46.1 42.0  MCV 92.9 92.2 93.1  PLT 232 256 701    Basic Metabolic Panel: Recent Labs  Lab 09/24/21 1029 09/24/21 1522 09/25/21 0553 09/26/21 0538  NA 133*  --  139 138  K 5.8* 3.4* 4.6 3.8  CL 102  --  101 103  CO2 22  --  27 27  GLUCOSE 140*  --  167* 174*  BUN 22  --  23 28*  CREATININE 0.89  --  0.95 0.83  CALCIUM 8.8*  --  9.5 9.3  MG  --   --   --  2.1    GFR: Estimated Creatinine Clearance: 59.7 mL/min (by C-G formula based on SCr of 0.83 mg/dL).  Liver Function Tests: Recent Labs  Lab 09/25/21 0553  AST 26  ALT 29  ALKPHOS 62  BILITOT 0.9  PROT 7.3  ALBUMIN 3.6    CBG: No results for input(s): GLUCAP in the last 168 hours.   Recent Results (from the past 240 hour(s))  Resp Panel by RT-PCR (Flu A&B, Covid) Nasopharyngeal Swab     Status: None   Collection Time: 09/17/21 10:39 AM   Specimen: Nasopharyngeal Swab; Nasopharyngeal(NP) swabs in vial transport medium  Result Value Ref Range Status   SARS Coronavirus 2 by RT PCR NEGATIVE NEGATIVE Final    Comment: (NOTE) SARS-CoV-2 target nucleic acids are NOT DETECTED.  The SARS-CoV-2 RNA is generally detectable in upper respiratory specimens during the acute phase of infection. The  lowest concentration of SARS-CoV-2 viral copies this assay can detect is 138 copies/mL. A negative result does not preclude SARS-Cov-2 infection and should not be used as the sole basis for treatment or other patient management decisions. A negative result may occur with  improper specimen collection/handling, submission of specimen other than nasopharyngeal swab, presence of viral mutation(s) within the areas targeted by this assay, and inadequate number of viral copies(<138 copies/mL). A negative result must be combined with clinical observations, patient history, and epidemiological information. The expected result is  Negative.  Fact Sheet for Patients:  EntrepreneurPulse.com.au  Fact Sheet for Healthcare Providers:  IncredibleEmployment.be  This test is no t yet approved or cleared by the [place] FDA and  has been authorized for detection and/or diagnosis of SARS-CoV-2 by FDA under an Emergency Use Authorization (EUA). This EUA will remain  in effect (meaning this test can be used) for the duration of the COVID-19 declaration under Section 564(b)(1) of the Act, 21 U.S.C.section 360bbb-3(b)(1), unless the authorization is terminated  or revoked sooner.       Influenza A by PCR NEGATIVE NEGATIVE Final   Influenza B by PCR NEGATIVE NEGATIVE Final    Comment: (NOTE) The Xpert Xpress SARS-CoV-2/FLU/RSV plus assay is intended as an aid in the diagnosis of influenza from Nasopharyngeal swab specimens and should not be used as a sole basis for treatment. Nasal washings and aspirates are unacceptable for Xpert Xpress SARS-CoV-2/FLU/RSV testing.  Fact Sheet for Patients: EntrepreneurPulse.com.au  Fact Sheet for Healthcare Providers: IncredibleEmployment.be  This test is not yet approved or cleared by the [place] FDA and has been authorized for detection and/or diagnosis of SARS-CoV-2 by FDA under  an Emergency Use Authorization (EUA). This EUA will remain in effect (meaning this test can be used) for the duration of the COVID-19 declaration under Section 564(b)(1) of the Act, 21 U.S.C. section 360bbb-3(b)(1), unless the authorization is terminated or revoked.  Performed at [facility], [address], [place], [place] [address]   Resp Panel by RT-PCR (Flu A&B, Covid) Nasopharyngeal Swab     Status: None   Collection Time: 09/24/21 11:49 AM   Specimen: Nasopharyngeal Swab; Nasopharyngeal(NP) swabs in vial transport medium  Result Value Ref Range Status   SARS Coronavirus 2 by RT PCR NEGATIVE NEGATIVE Final    Comment: (NOTE) SARS-CoV-2 target nucleic acids are NOT DETECTED.  The SARS-CoV-2 RNA is generally detectable in upper respiratory specimens during the acute phase of infection. The lowest concentration of SARS-CoV-2 viral copies this assay can detect is 138 copies/mL. A negative result does not preclude SARS-Cov-2 infection and should not be used as the sole basis for treatment or other patient management decisions. A negative result may occur with  improper specimen collection/handling, submission of specimen other than nasopharyngeal swab, presence of viral mutation(s) within the areas targeted by this assay, and inadequate number of viral copies(<138 copies/mL). A negative result must be combined with clinical observations, patient history, and epidemiological information. The expected result is Negative.  Fact Sheet for Patients:  EntrepreneurPulse.com.au  Fact Sheet for Healthcare Providers:  IncredibleEmployment.be  This test is no t yet approved or cleared by the [place] FDA and  has been authorized for detection and/or diagnosis of SARS-CoV-2 by FDA under an Emergency Use Authorization (EUA). This EUA will remain  in effect (meaning this test can be used) for the duration of the COVID-19 declaration under Section  564(b)(1) of the Act, 21 U.S.C.section 360bbb-3(b)(1), unless the authorization is terminated  or revoked sooner.       Influenza A by PCR NEGATIVE NEGATIVE Final   Influenza B by PCR NEGATIVE NEGATIVE Final    Comment: (NOTE) The Xpert Xpress SARS-CoV-2/FLU/RSV plus assay is intended as an aid in the diagnosis of influenza from Nasopharyngeal swab specimens and should not be used as a sole basis for treatment. Nasal washings and aspirates are unacceptable for Xpert Xpress SARS-CoV-2/FLU/RSV testing.  Fact Sheet for Patients: EntrepreneurPulse.com.au  Fact Sheet for Healthcare Providers: IncredibleEmployment.be  This test is not yet  approved or cleared by the [place] and has been authorized for detection and/or diagnosis of SARS-CoV-2 by FDA under an Emergency Use Authorization (EUA). This EUA will remain in effect (meaning this test can be used) for the duration of the COVID-19 declaration under Section 564(b)(1) of the Act, 21 U.S.C. section 360bbb-3(b)(1), unless the authorization is terminated or revoked.  Performed at [facility], [address], [place], [place] [address]   MRSA Next Gen by PCR, Nasal     Status: None   Collection Time: 09/25/21  6:27 PM   Specimen: Nasal Mucosa; Nasal Swab  Result Value Ref Range Status   MRSA by PCR Next Gen NOT DETECTED NOT DETECTED Final    Comment: (NOTE) The GeneXpert MRSA Assay (FDA approved for NASAL specimens only), is one component of a comprehensive MRSA colonization surveillance program. It is not intended to diagnose MRSA infection nor to guide or monitor treatment for MRSA infections. Test performance is not FDA approved in patients less than 62 years old. Performed at [facility], [address] [place], [place] [address]   Expectorated Sputum Assessment w Gram Stain, Rflx to Resp Cult     Status: None   Collection Time: 09/25/21  8:15 PM   Specimen: Expectorated Sputum   Result Value Ref Range Status   Specimen Description EXPECTORATED SPUTUM  Final   Special Requests NONE  Final   Sputum evaluation   Final    THIS SPECIMEN IS ACCEPTABLE FOR SPUTUM CULTURE PERFORMED AT [facility] Performed at [facility], [address], [place], [place] [address]    Report Status 09/25/2021 FINAL  Final  Culture, Respiratory w Gram Stain     Status: None (Preliminary result)   Collection Time: 09/25/21  8:15 PM  Result Value Ref Range Status   Specimen Description   Final    EXPECTORATED SPUTUM Performed at [facility], [address], [place], [place] [address]    Special Requests   Final    NONE Reflexed from [id number] Performed at [facility], [address], [place], [place] [address]    Gram Stain   Final    FEW SQUAMOUS EPITHELIAL CELLS PRESENT RARE WBC PRESENT, PREDOMINANTLY PMN MODERATE GRAM POSITIVE COCCI FEW GRAM POSITIVE RODS    Culture   Final    CULTURE REINCUBATED FOR BETTER GROWTH Performed at [facility], [place] [address], [place], [place] [address]    Report Status PENDING  Incomplete         Radiology Studies: DG Chest 2 View  Result Date: 09/26/2021 CLINICAL DATA:  Cough, dyspnea, COPD EXAM: CHEST - 2 VIEW COMPARISON:  09/24/2021 chest radiograph. FINDINGS: Stable cardiomediastinal silhouette with normal heart size. No pneumothorax. No pleural effusion. Emphysema. Mildly hyperinflated lungs. No pulmonary edema. Mild eventration of right greater than left hemidiaphragms. Mild streaky curvilinear bibasilar lung opacities, similar. No acute consolidative airspace disease. IMPRESSION: 1. Emphysema and mildly hyperinflated lungs, compatible with reported COPD. 2. Chronic mild streaky bibasilar lung opacities, compatible with scarring as seen on recent chest CT. No acute cardiopulmonary disease. Electronically Signed   By: Ilona Sorrel M.D.   On: 09/26/2021 14:40        Scheduled Meds:  enoxaparin (LOVENOX) injection  40 mg Subcutaneous Q24H    feeding supplement  237 mL Oral TID BM   guaiFENesin  600 mg Oral BID   ipratropium-albuterol  3 mL Nebulization TID   methylPREDNISolone (SOLU-MEDROL) injection  40 mg Intravenous Q12H   sodium chloride  HYPERTONIC  4 mL Nebulization Daily   Continuous Infusions:  doxycycline (VIBRAMYCIN) IV 100 mg (09/27/21 0526)     LOS: 1 day   Time spent: 37mins Greater than 50% of this time was spent in counseling, explanation of diagnosis, planning of further management, and coordination of care.   Voice Recognition Viviann Spare dictation system was used to create this note, attempts have been made to correct errors. Please contact the author with questions and/or clarifications.   Florencia Reasons, MD PhD FACP [facility]  Available via Epic secure chat 7am-7pm for nonurgent issues Please page for urgent issues To page the attending provider between 7A-7P or the covering provider during after hours 7P-7A, please log into the web site [email or web address] and access using universal Ruth password for that web site. If you do not have the password, please call the hospital operator.    09/27/2021, 8:55 AM

## 2021-09-28 ENCOUNTER — Encounter (HOSPITAL_COMMUNITY): Payer: Self-pay | Admitting: Internal Medicine

## 2021-09-28 DIAGNOSIS — J441 Chronic obstructive pulmonary disease with (acute) exacerbation: Secondary | ICD-10-CM | POA: Diagnosis not present

## 2021-09-28 DIAGNOSIS — J9621 Acute and chronic respiratory failure with hypoxia: Secondary | ICD-10-CM | POA: Diagnosis not present

## 2021-09-28 DIAGNOSIS — G4733 Obstructive sleep apnea (adult) (pediatric): Secondary | ICD-10-CM | POA: Diagnosis not present

## 2021-09-28 LAB — CULTURE, RESPIRATORY W GRAM STAIN: Culture: NORMAL

## 2021-09-28 MED ORDER — METHYLPREDNISOLONE SODIUM SUCC 40 MG IJ SOLR
40.0000 mg | Freq: Three times a day (TID) | INTRAMUSCULAR | Status: DC
  Administered 2021-09-28 – 2021-09-29 (×4): 40 mg via INTRAVENOUS
  Filled 2021-09-28 (×3): qty 1

## 2021-09-28 MED ORDER — BUDESONIDE 0.25 MG/2ML IN SUSP
0.2500 mg | Freq: Two times a day (BID) | RESPIRATORY_TRACT | Status: DC
  Administered 2021-09-28 – 2021-09-29 (×3): 0.25 mg via RESPIRATORY_TRACT
  Filled 2021-09-28 (×3): qty 2

## 2021-09-28 MED ORDER — IPRATROPIUM-ALBUTEROL 0.5-2.5 (3) MG/3ML IN SOLN
3.0000 mL | Freq: Three times a day (TID) | RESPIRATORY_TRACT | Status: DC
  Administered 2021-09-28 – 2021-09-29 (×3): 3 mL via RESPIRATORY_TRACT
  Filled 2021-09-28 (×3): qty 3

## 2021-09-28 MED ORDER — GUAIFENESIN ER 600 MG PO TB12
1200.0000 mg | ORAL_TABLET | Freq: Two times a day (BID) | ORAL | Status: DC
  Administered 2021-09-28 – 2021-09-29 (×3): 1200 mg via ORAL
  Filled 2021-09-28 (×3): qty 2

## 2021-09-28 MED ORDER — IPRATROPIUM-ALBUTEROL 0.5-2.5 (3) MG/3ML IN SOLN: 3.0000 mL | RESPIRATORY_TRACT | Status: DC

## 2021-09-28 MED ORDER — ARFORMOTEROL TARTRATE 15 MCG/2ML IN NEBU
15.0000 ug | INHALATION_SOLUTION | Freq: Two times a day (BID) | RESPIRATORY_TRACT | Status: DC
  Administered 2021-09-28 – 2021-09-29 (×3): 15 ug via RESPIRATORY_TRACT
  Filled 2021-09-28 (×3): qty 2

## 2021-09-28 MED ORDER — DOXYCYCLINE HYCLATE 100 MG PO TABS
100.0000 mg | ORAL_TABLET | Freq: Two times a day (BID) | ORAL | Status: DC
  Administered 2021-09-28 – 2021-09-29 (×2): 100 mg via ORAL
  Filled 2021-09-28 (×2): qty 1

## 2021-09-28 NOTE — Progress Notes (Signed)
PROGRESS NOTE   Michael Evans  NID:[id number] DOB: 1941-05-17 DOA: 09/24/2021 PCP: [name], MD   Chief Complaint  Patient presents with   Shortness of Breath   Level of care: Telemetry  Brief Admission History:   80 y.o. male with medical history significant of COPD, chronic respiratory failure on 2 L, hypertension, arthritis presented with acute on chronic respiratory failure with hypoxia and COPD exacerbation.  Patient noted to have been seen in the ER back on October 18 for similar symptoms.  Was diagnosed with a COPD exacerbation.  His symptoms have been difficult to control in hospital and escalation of therapy has been started.   Assessment & Plan:   Active Problems:   Essential hypertension, benign   OSA (obstructive sleep apnea)   COPD exacerbation (HCC)   Acute on chronic respiratory failure with hypoxia (HCC)   Hyperglycemia   Acute on chronic respiratory failure (HCC)   COPD (chronic obstructive pulmonary disease) (HCC)   Acute on chronic respiratory failure with hypoxia secondary to COPD exacerbation -Patient reports symptoms got acutely worse overnight - Increase frequency of DuoNeb treatments - Start Pulmicort and Brovana nebulizer treatments twice daily - Increase Mucinex to 1200 mg twice daily - Check magnesium and supplement as needed - Change doxycycline to p.o. formulation  Severe COPD - Patient has diffuse if for emphysematous changes on x-ray - see above recommendations for acute exacerbation  -Sputum culture reviewed normal respiratory flora no staph or Pseudomonas noted  Essential Hypertension  -His blood pressure is managed with diet only, we are monitoring and it has been stable and controlled  OSA -We will offer nightly CPAP given his symptoms seem to worsen at night    DVT prophylaxis: enoxaparin  Code Status: Full  Family Communication: sister by telephone  Disposition: anticipating DC home  Status is: Inpatient  Remains  inpatient appropriate because: he still requires IV steroids   Consultants:    Procedures:    Antimicrobials:  Doxycycline 10/26>>   Subjective: Pt reports that his breathing became much worse overnight and he is having difficulty breathing.   Objective: Vitals:   09/27/21 2048 09/27/21 2116 09/28/21 0627 09/28/21 0858  BP:  128/62 132/77   Pulse:  72 (!) 58   Resp:  20 (!) 22   Temp:  97.8 F (36.6 C) 98 F (36.7 C)   TempSrc:  Oral Oral   SpO2: 91% 97% 98% 94%  Weight:      Height:        Intake/Output Summary (Last 24 hours) at 09/28/2021 0913 Last data filed at 09/28/2021 0856 Gross per 24 hour  Intake 2664.72 ml  Output 600 ml  Net 2064.72 ml   Filed Weights   09/24/21 1942  Weight: 62.6 kg    Examination:  General exam: elderly male, awake, alert, oriented x 3, Appears calm.  Cooperative.  Respiratory system: Moderate increased work of breathing, poor air movement diffuse wheezes/rales.  Cardiovascular system: normal S1 & S2 heard. No JVD, murmurs, rubs, gallops or clicks. No pedal edema. Gastrointestinal system: Abdomen is nondistended, soft and nontender. No organomegaly or masses felt. Normal bowel sounds heard. Central nervous system: Alert and oriented. No focal neurological deficits. Extremities: Symmetric 5 x 5 power. Skin: No rashes, lesions or ulcers Psychiatry: Judgement and insight appear normal. Mood & affect appropriate.   Data Reviewed: I have personally reviewed following labs and imaging studies  CBC: Recent Labs  Lab 09/24/21 1029 09/25/21 0553 09/26/21 0538  WBC  10.6* 17.4* 21.0*  NEUTROABS 7.4 15.4* 18.6*  HGB 15.0 15.2 13.8  HCT 44.2 46.1 42.0  MCV 92.9 92.2 93.1  PLT 232 256 263    Basic Metabolic Panel: Recent Labs  Lab 09/24/21 1029 09/24/21 1522 09/25/21 0553 09/26/21 0538  NA 133*  --  139 138  K 5.8* 3.4* 4.6 3.8  CL 102  --  101 103  CO2 22  --  27 27  GLUCOSE 140*  --  167* 174*  BUN 22  --  23 28*   CREATININE 0.89  --  0.95 0.83  CALCIUM 8.8*  --  9.5 9.3  MG  --   --   --  2.1    GFR: Estimated Creatinine Clearance: 59.7 mL/min (by C-G formula based on SCr of 0.83 mg/dL).  Liver Function Tests: Recent Labs  Lab 09/25/21 0553  AST 26  ALT 29  ALKPHOS 62  BILITOT 0.9  PROT 7.3  ALBUMIN 3.6    CBG: No results for input(s): GLUCAP in the last 168 hours.  Recent Results (from the past 240 hour(s))  Resp Panel by RT-PCR (Flu A&B, Covid) Nasopharyngeal Swab     Status: None   Collection Time: 09/24/21 11:49 AM   Specimen: Nasopharyngeal Swab; Nasopharyngeal(NP) swabs in vial transport medium  Result Value Ref Range Status   SARS Coronavirus 2 by RT PCR NEGATIVE NEGATIVE Final    Comment: (NOTE) SARS-CoV-2 target nucleic acids are NOT DETECTED.  The SARS-CoV-2 RNA is generally detectable in upper respiratory specimens during the acute phase of infection. The lowest concentration of SARS-CoV-2 viral copies this assay can detect is 138 copies/mL. A negative result does not preclude SARS-Cov-2 infection and should not be used as the sole basis for treatment or other patient management decisions. A negative result may occur with  improper specimen collection/handling, submission of specimen other than nasopharyngeal swab, presence of viral mutation(s) within the areas targeted by this assay, and inadequate number of viral copies(<138 copies/mL). A negative result must be combined with clinical observations, patient history, and epidemiological information. The expected result is Negative.  Fact Sheet for Patients:  EntrepreneurPulse.com.au  Fact Sheet for Healthcare Providers:  IncredibleEmployment.be  This test is no t yet approved or cleared by the [place] FDA and  has been authorized for detection and/or diagnosis of SARS-CoV-2 by FDA under an Emergency Use Authorization (EUA). This EUA will remain  in effect (meaning this  test can be used) for the duration of the COVID-19 declaration under Section 564(b)(1) of the Act, 21 U.S.C.section 360bbb-3(b)(1), unless the authorization is terminated  or revoked sooner.       Influenza A by PCR NEGATIVE NEGATIVE Final   Influenza B by PCR NEGATIVE NEGATIVE Final    Comment: (NOTE) The Xpert Xpress SARS-CoV-2/FLU/RSV plus assay is intended as an aid in the diagnosis of influenza from Nasopharyngeal swab specimens and should not be used as a sole basis for treatment. Nasal washings and aspirates are unacceptable for Xpert Xpress SARS-CoV-2/FLU/RSV testing.  Fact Sheet for Patients: EntrepreneurPulse.com.au  Fact Sheet for Healthcare Providers: IncredibleEmployment.be  This test is not yet approved or cleared by the [place] FDA and has been authorized for detection and/or diagnosis of SARS-CoV-2 by FDA under an Emergency Use Authorization (EUA). This EUA will remain in effect (meaning this test can be used) for the duration of the COVID-19 declaration under Section 564(b)(1) of the Act, 21 U.S.C. section 360bbb-3(b)(1), unless the authorization is terminated or revoked.  Performed at [facility], [address], [place], [place] [address]   MRSA Next Gen by PCR, Nasal     Status: None   Collection Time: 09/25/21  6:27 PM   Specimen: Nasal Mucosa; Nasal Swab  Result Value Ref Range Status   MRSA by PCR Next Gen NOT DETECTED NOT DETECTED Final    Comment: (NOTE) The GeneXpert MRSA Assay (FDA approved for NASAL specimens only), is one component of a comprehensive MRSA colonization surveillance program. It is not intended to diagnose MRSA infection nor to guide or monitor treatment for MRSA infections. Test performance is not FDA approved in patients less than 15 years old. Performed at [facility], [address] [place], [place] [address]   Expectorated Sputum Assessment w Gram Stain, Rflx to Resp Cult     Status:  None   Collection Time: 09/25/21  8:15 PM   Specimen: Expectorated Sputum  Result Value Ref Range Status   Specimen Description EXPECTORATED SPUTUM  Final   Special Requests NONE  Final   Sputum evaluation   Final    THIS SPECIMEN IS ACCEPTABLE FOR SPUTUM CULTURE PERFORMED AT [facility] Performed at [facility], [address], [place], [place] [address]    Report Status 09/25/2021 FINAL  Final  Culture, Respiratory w Gram Stain     Status: None   Collection Time: 09/25/21  8:15 PM  Result Value Ref Range Status   Specimen Description   Final    EXPECTORATED SPUTUM Performed at [facility], [address] [place], [place] [address]    Special Requests   Final    NONE Reflexed from [id number] Performed at [facility], [address] [place], [place] [address]    Gram Stain   Final    FEW SQUAMOUS EPITHELIAL CELLS PRESENT RARE WBC PRESENT, PREDOMINANTLY PMN MODERATE GRAM POSITIVE COCCI FEW GRAM POSITIVE RODS    Culture   Final    FEW Normal respiratory flora-no Staph aureus or Pseudomonas seen Performed at [facility], [place] [address] [place],  [address]    Report Status 09/28/2021 FINAL  Final     Radiology Studies: DG Chest 2 View  Result Date: 09/26/2021 CLINICAL DATA:  Cough, dyspnea, COPD EXAM: CHEST - 2 VIEW COMPARISON:  09/24/2021 chest radiograph. FINDINGS: Stable cardiomediastinal silhouette with normal heart size. No pneumothorax. No pleural effusion. Emphysema. Mildly hyperinflated lungs. No pulmonary edema. Mild eventration of right greater than left hemidiaphragms. Mild streaky curvilinear bibasilar lung opacities, similar. No acute consolidative airspace disease. IMPRESSION: 1. Emphysema and mildly hyperinflated lungs, compatible with reported COPD. 2. Chronic mild streaky bibasilar lung opacities, compatible with scarring as seen on recent chest CT. No acute cardiopulmonary disease. Electronically Signed   By: Ilona Sorrel M.D.   On: 09/26/2021 14:40     Scheduled Meds:  arformoterol  15 mcg Nebulization BID   budesonide (PULMICORT) nebulizer solution  0.25 mg Nebulization BID   doxycycline  100 mg Oral Q12H   enoxaparin (LOVENOX) injection  40 mg Subcutaneous Q24H   feeding supplement  237 mL Oral TID BM   guaiFENesin  1,200 mg Oral BID   ipratropium-albuterol  3 mL Nebulization TID   methylPREDNISolone (SOLU-MEDROL) injection  40 mg Intravenous TID   Continuous Infusions:   LOS: 2 days   Time spent: 37 mins   Ronan Dion Wynetta Emery, MD How to contact the [facility] Attending or Consulting provider [place] or covering provider during after hours [place], for this patient?  Check  the care team in [facility] and look for a) attending/consulting [place] provider listed and b) the [facility] team listed Log into [email or web address] and use [place] universal password to access. If you do not have the password, please contact the hospital operator. Locate the [facility] provider you are looking for under [facility] and page to a number that you can be directly reached. If you still have difficulty reaching the provider, please page the [facility] (Director on Call) for the Hospitalists listed on amion for assistance.  09/28/2021, 9:13 AM

## 2021-09-29 DIAGNOSIS — I10 Essential (primary) hypertension: Secondary | ICD-10-CM

## 2021-09-29 DIAGNOSIS — J441 Chronic obstructive pulmonary disease with (acute) exacerbation: Secondary | ICD-10-CM | POA: Diagnosis not present

## 2021-09-29 DIAGNOSIS — J9621 Acute and chronic respiratory failure with hypoxia: Secondary | ICD-10-CM | POA: Diagnosis not present

## 2021-09-29 MED ORDER — PREDNISONE 20 MG PO TABS: ORAL_TABLET | ORAL | 0 refills | Status: DC

## 2021-09-29 MED ORDER — GUAIFENESIN ER 600 MG PO TB12: 1200.0000 mg | ORAL_TABLET | Freq: Two times a day (BID) | ORAL | 0 refills | Status: AC

## 2021-09-29 MED ORDER — IPRATROPIUM-ALBUTEROL 0.5-2.5 (3) MG/3ML IN SOLN: 3.0000 mL | Freq: Three times a day (TID) | RESPIRATORY_TRACT | 1 refills | Status: DC

## 2021-09-29 MED ORDER — DOXYCYCLINE HYCLATE 100 MG PO TABS: 100.0000 mg | ORAL_TABLET | Freq: Two times a day (BID) | ORAL | 0 refills | Status: AC

## 2021-09-29 NOTE — Progress Notes (Signed)
Discharge instructions provided to patient and his daughter. Questions answered.

## 2021-09-29 NOTE — Discharge Summary (Signed)
Physician Discharge Summary  Michael Evans EYC:[id number] DOB: May 28, 1941 DOA: 09/24/2021  PCP: [name], MD  Admit date: 09/24/2021 Discharge date: 09/29/2021  Admitted From:  Home  Disposition: Home   Recommendations for Outpatient Follow-up:  Follow up with PCP in 1 weeks Follow up with [facility]    Discharge Condition: STABLE   CODE STATUS: FULL DIET: Heart Healthy    Brief Hospitalization Summary: Please see all hospital notes, images, labs for full details of the hospitalization. ADMISSION HPI: Michael Evans is a 80 y.o. male with medical history significant of COPD, chronic respiratory failure on 2 L, hypertension, arthritis presented with acute on chronic respiratory failure with hypoxia and COPD exacerbation.  Patient noted to have been seen in the ER back on October 18 for similar symptoms.  Was diagnosed with a COPD exacerbation.  Per report, patient was placed on a Z-Pak.  Symptoms have minimally improved over the course of this treatment.  Unclear if patient got steroids with evaluation.  Per the patient, he has had increased cough, wheezing, mild sputum production.  No chest pain.  No nausea or vomiting.  Patient does attribute the flare to likely weather changes.  Patient states that symptoms got worse once the vents were closed in his house.  No fevers or chills.  No reported sick contacts.  Patient denies smoking.  Per the sister, [name], patient quit smoking years ago.   ED Course: Presented to the ER afebrile, hemodynamically stable.  Requiring 2 to 4 L nasal cannula for oxygenation.  COVID-negative.  Labs notable for K of 5.8.  Resolved after albuterol treatment and Lokelma with a potassium of 3.4.  Troponin and EKG within normal limits.  Chest x-ray within normal limits.   HOSPITAL COURSE by problem list   Acute on chronic respiratory failure with hypoxia secondary to COPD exacerbation -Patient was treated with bronchodilators, IV steroids,  antibiotics and now feeling back to his baseline.  DC home with outpatient follow up.  He will discharge home on prednisone taper.  Ambulatory referral to [facility].  He has not seen them in several years.     Severe COPD - Patient has diffuse if for emphysematous changes on x-ray - see above recommendations for acute exacerbation  -Sputum culture reviewed normal respiratory flora no staph or Pseudomonas noted He will discharge home on prednisone taper and ambulatory referral to [facility] pulmonary clinic.     Essential Hypertension  -His blood pressure is managed with diet only, we are monitoring and it has been stable and controlled   OSA -We offered nightly CPAP in hospital       DVT prophylaxis: enoxaparin  Code Status: Full  Family Communication: sister by telephone  Disposition: anticipating DC home  Status is: Inpatient  Discharge Diagnoses:  Active Problems:   Essential hypertension, benign   OSA (obstructive sleep apnea)   COPD exacerbation (HCC)   Acute on chronic respiratory failure with hypoxia (HCC)   Hyperglycemia   Acute on chronic respiratory failure (HCC)   COPD (chronic obstructive pulmonary disease) [facility])   Discharge Instructions: Discharge Instructions     Ambulatory referral to Pulmonology   Complete by: As directed    Hospital Follow Up   Reason for referral: Asthma/COPD      Allergies as of 09/29/2021       Reactions   Amlodipine Swelling   Swelling in feet   Aspirin Other (See Comments)   Upset stomach.   Other Hypertension  Hazelnuts   Penicillins Hives   Has patient had a PCN reaction causing immediate rash, facial/tongue/throat swelling, SOB or lightheadedness with hypotension: Yes Has patient had a PCN reaction causing severe rash involving mucus membranes or skin necrosis: No Has patient had a PCN reaction that required hospitalization No Has patient had a PCN reaction occurring within the last 10 years: No If all of the  above answers are "NO", then may proceed with Cephalosporin use.   Sulfa Antibiotics Hives        Medication List     STOP taking these medications    guaiFENesin-codeine 100-10 MG/5ML syrup       TAKE these medications    albuterol 108 (90 Base) MCG/ACT inhaler Commonly known as: VENTOLIN HFA Inhale 1-2 puffs into the lungs every 6 (six) hours as needed for wheezing or shortness of breath. What changed: Another medication with the same name was removed. Continue taking this medication, and follow the directions you see here.   budesonide-formoterol 160-4.5 MCG/ACT inhaler Commonly known as: Symbicort TAKE 2 PUFFS BY MOUTH TWICE A DAY   cholecalciferol 25 MCG (1000 UNIT) tablet Commonly known as: VITAMIN D Take 3,000 Units by mouth daily.   cyanocobalamin 2000 MCG tablet Take 1,000 mcg by mouth daily.   doxycycline 100 MG tablet Commonly known as: VIBRA-TABS Take 1 tablet (100 mg total) by mouth every 12 (twelve) hours for 3 days.   FISH OIL CONCENTRATE PO Take 1 capsule by mouth daily.   guaiFENesin 600 MG 12 hr tablet Commonly known as: MUCINEX Take 2 tablets (1,200 mg total) by mouth 2 (two) times daily for 5 days.   ipratropium-albuterol 0.5-2.5 (3) MG/3ML Soln Commonly known as: DUONEB Take 3 mLs by nebulization 3 (three) times daily.   multivitamin tablet Take 1 tablet by mouth daily.   OXYGEN Inhale 2 L into the lungs continuous.   predniSONE 20 MG tablet Commonly known as: DELTASONE Take 3 PO QAM x5days, 2 PO QAM x5days, 1 PO QAM x5days Start taking on: September 30, 2021   pyridoxine 100 MG tablet Commonly known as: B-6 Take 100 mg by mouth daily.   vitamin C 1000 MG tablet Take 1,000 mg by mouth daily.   zinc sulfate 220 (50 Zn) MG capsule Take 220 mg by mouth daily.        Follow-up Information     [name], MD Follow up.   Specialty: Family Medicine Contact information: [address] [place]  [address] [phone]         Chesley Mires, MD Follow up in 1 month(s).   Specialty: Pulmonary Disease Why: for copd management Contact information: [address] [place] [address] [phone]                Allergies  Allergen Reactions   Amlodipine Swelling    Swelling in feet   Aspirin Other (See Comments)    Upset stomach.   Other Hypertension    Hazelnuts   Penicillins Hives    Has patient had a PCN reaction causing immediate rash, facial/tongue/throat swelling, SOB or lightheadedness with hypotension: Yes Has patient had a PCN reaction causing severe rash involving mucus membranes or skin necrosis: No Has patient had a PCN reaction that required hospitalization No Has patient had a PCN reaction occurring within the last 10 years: No If all of the above answers are "NO", then may proceed with Cephalosporin use.    Sulfa Antibiotics Hives  Allergies as of 09/29/2021       Reactions   Amlodipine Swelling   Swelling in feet   Aspirin Other (See Comments)   Upset stomach.   Other Hypertension   Hazelnuts   Penicillins Hives   Has patient had a PCN reaction causing immediate rash, facial/tongue/throat swelling, SOB or lightheadedness with hypotension: Yes Has patient had a PCN reaction causing severe rash involving mucus membranes or skin necrosis: No Has patient had a PCN reaction that required hospitalization No Has patient had a PCN reaction occurring within the last 10 years: No If all of the above answers are "NO", then may proceed with Cephalosporin use.   Sulfa Antibiotics Hives        Medication List     STOP taking these medications    guaiFENesin-codeine 100-10 MG/5ML syrup       TAKE these medications    albuterol 108 (90 Base) MCG/ACT inhaler Commonly known as: VENTOLIN HFA Inhale 1-2 puffs into the lungs every 6 (six) hours as needed for wheezing or shortness of breath. What changed: Another medication with the  same name was removed. Continue taking this medication, and follow the directions you see here.   budesonide-formoterol 160-4.5 MCG/ACT inhaler Commonly known as: Symbicort TAKE 2 PUFFS BY MOUTH TWICE A DAY   cholecalciferol 25 MCG (1000 UNIT) tablet Commonly known as: VITAMIN D Take 3,000 Units by mouth daily.   cyanocobalamin 2000 MCG tablet Take 1,000 mcg by mouth daily.   doxycycline 100 MG tablet Commonly known as: VIBRA-TABS Take 1 tablet (100 mg total) by mouth every 12 (twelve) hours for 3 days.   FISH OIL CONCENTRATE PO Take 1 capsule by mouth daily.   guaiFENesin 600 MG 12 hr tablet Commonly known as: MUCINEX Take 2 tablets (1,200 mg total) by mouth 2 (two) times daily for 5 days.   ipratropium-albuterol 0.5-2.5 (3) MG/3ML Soln Commonly known as: DUONEB Take 3 mLs by nebulization 3 (three) times daily.   multivitamin tablet Take 1 tablet by mouth daily.   OXYGEN Inhale 2 L into the lungs continuous.   predniSONE 20 MG tablet Commonly known as: DELTASONE Take 3 PO QAM x5days, 2 PO QAM x5days, 1 PO QAM x5days Start taking on: September 30, 2021   pyridoxine 100 MG tablet Commonly known as: B-6 Take 100 mg by mouth daily.   vitamin C 1000 MG tablet Take 1,000 mg by mouth daily.   zinc sulfate 220 (50 Zn) MG capsule Take 220 mg by mouth daily.        Procedures/Studies: DG Chest 2 View  Result Date: 09/26/2021 CLINICAL DATA:  Cough, dyspnea, COPD EXAM: CHEST - 2 VIEW COMPARISON:  09/24/2021 chest radiograph. FINDINGS: Stable cardiomediastinal silhouette with normal heart size. No pneumothorax. No pleural effusion. Emphysema. Mildly hyperinflated lungs. No pulmonary edema. Mild eventration of right greater than left hemidiaphragms. Mild streaky curvilinear bibasilar lung opacities, similar. No acute consolidative airspace disease. IMPRESSION: 1. Emphysema and mildly hyperinflated lungs, compatible with reported COPD. 2. Chronic mild streaky bibasilar lung  opacities, compatible with scarring as seen on recent chest CT. No acute cardiopulmonary disease. Electronically Signed   By: Ilona Sorrel M.D.   On: 09/26/2021 14:40   DG Chest 2 View  Result Date: 09/24/2021 CLINICAL DATA:  Shortness of breath, cough, congestion EXAM: CHEST - 2 VIEW COMPARISON:  Chest radiograph and chest CT 09/17/2021 FINDINGS: The cardiomediastinal silhouette is stable. The lungs are hyperinflated consistent with underlying COPD. Linear opacities in the left midlung  and right base likely reflect scar as seen on prior CT. There is no new focal airspace disease. There is no pleural effusion or pneumothorax. The bones are stable. IMPRESSION: Stable chest with no radiographic evidence of acute cardiopulmonary process. Electronically Signed   By: Valetta Mole M.D.   On: 09/24/2021 11:09   CT Chest Wo Contrast  Result Date: 09/17/2021 CLINICAL DATA:  Cough, persistent abnormal cxr EXAM: CT CHEST WITHOUT CONTRAST TECHNIQUE: Multidetector CT imaging of the chest was performed following the standard protocol without IV contrast. COMPARISON:  November 23, 2020 FINDINGS: Cardiovascular: Heart is normal in size. Three-vessel coronary artery atherosclerotic calcifications. Atherosclerotic calcifications of the aorta. Trace pericardial fluid. Mediastinum/Nodes: No axillary or mediastinal adenopathy. Visualized thyroid is unremarkable. Lungs/Pleura: Increased mucous and debris lining the trachea; this accounts for the thickening of the RIGHT paratracheal stripe. Severe emphysematous changes. Lingular, RIGHT middle lobe and RIGHT lower lobe atelectasis, similar in comparison to prior. Increased diffuse bronchial wall thickening with scattered areas of endobronchial debris. Upper Abdomen: No acute abnormality.  Small hiatal hernia. Musculoskeletal: No chest wall mass or suspicious bone lesions identified. IMPRESSION: 1. Increased mucous and debris lining the trachea; this accounts for the thickening of  the RIGHT paratracheal stripe. This in combination with diffuse bronchial wall thickening and scattered areas of endobronchial debris are most consistent with bronchitis. Aortic Atherosclerosis (ICD10-I70.0) and Emphysema (ICD10-J43.9). Electronically Signed   By: Valentino Saxon M.D.   On: 09/17/2021 13:01   DG Chest Port 1 View  Result Date: 09/17/2021 CLINICAL DATA:  productive cough EXAM: PORTABLE CHEST 1 VIEW COMPARISON:  December 24th 2021 FINDINGS: The cardiomediastinal silhouette is unchanged in contour.Atherosclerotic calcifications of the aorta. Possible increased thickening of the RIGHT paratracheal stripe with possible narrowing at the level of the aortic arch. Elevation of the RIGHT hemidiaphragm, unchanged. Emphysematous changes. No pleural effusion. No pneumothorax. No acute pleuroparenchymal abnormality. Visualized abdomen is unremarkable. IMPRESSION: Possible increased thickening of the RIGHT paratracheal stripe in comparison to prior with questionable narrowing of the trachea at level of the aortic arch. Consider further dedicated evaluation with noncontrast chest CT. Electronically Signed   By: Valentino Saxon M.D.   On: 09/17/2021 11:01     Subjective: Pt says he is feeling a lot better today.  He is breathing a lot better and he appreciates that we started him on the pulmicort/brovana nebs yesterday.  He feels well to go home and has family that check on him at home regularly.     Discharge Exam: Vitals:   09/28/21 2115 09/29/21 0445  BP: 134/70 (!) 143/72  Pulse: 70 (!) 59  Resp: 18 (!) 8  Temp: (!) 97.1 F (36.2 C) (!) 97 F (36.1 C)  SpO2: 95% 96%   Vitals:   09/28/21 1947 09/28/21 1948 09/28/21 2115 09/29/21 0445  BP:   134/70 (!) 143/72  Pulse:   70 (!) 59  Resp:   18 (!) 8  Temp:   (!) 97.1 F (36.2 C) (!) 97 F (36.1 C)  TempSrc:      SpO2: 97% 97% 95% 96%  Weight:      Height:       General: Pt is alert, awake, not in acute  distress Cardiovascular: RRR, S1/S2 +, no rubs, no gallops Respiratory: good air movement bilateral, no increased work of breathing Abdominal: Soft, NT, ND, bowel sounds + Extremities: no edema, no cyanosis   The results of significant diagnostics from this hospitalization (including imaging, microbiology, ancillary and laboratory) are  listed below for reference.     Microbiology: Recent Results (from the past 240 hour(s))  Resp Panel by RT-PCR (Flu A&B, Covid) Nasopharyngeal Swab     Status: None   Collection Time: 09/24/21 11:49 AM   Specimen: Nasopharyngeal Swab; Nasopharyngeal(NP) swabs in vial transport medium  Result Value Ref Range Status   SARS Coronavirus 2 by RT PCR NEGATIVE NEGATIVE Final    Comment: (NOTE) SARS-CoV-2 target nucleic acids are NOT DETECTED.  The SARS-CoV-2 RNA is generally detectable in upper respiratory specimens during the acute phase of infection. The lowest concentration of SARS-CoV-2 viral copies this assay can detect is 138 copies/mL. A negative result does not preclude SARS-Cov-2 infection and should not be used as the sole basis for treatment or other patient management decisions. A negative result may occur with  improper specimen collection/handling, submission of specimen other than nasopharyngeal swab, presence of viral mutation(s) within the areas targeted by this assay, and inadequate number of viral copies(<138 copies/mL). A negative result must be combined with clinical observations, patient history, and epidemiological information. The expected result is Negative.  Fact Sheet for Patients:  EntrepreneurPulse.com.au  Fact Sheet for Healthcare Providers:  IncredibleEmployment.be  This test is no t yet approved or cleared by the [place] FDA and  has been authorized for detection and/or diagnosis of SARS-CoV-2 by FDA under an Emergency Use Authorization (EUA). This EUA will remain  in effect  (meaning this test can be used) for the duration of the COVID-19 declaration under Section 564(b)(1) of the Act, 21 U.S.C.section 360bbb-3(b)(1), unless the authorization is terminated  or revoked sooner.       Influenza A by PCR NEGATIVE NEGATIVE Final   Influenza B by PCR NEGATIVE NEGATIVE Final    Comment: (NOTE) The Xpert Xpress SARS-CoV-2/FLU/RSV plus assay is intended as an aid in the diagnosis of influenza from Nasopharyngeal swab specimens and should not be used as a sole basis for treatment. Nasal washings and aspirates are unacceptable for Xpert Xpress SARS-CoV-2/FLU/RSV testing.  Fact Sheet for Patients: EntrepreneurPulse.com.au  Fact Sheet for Healthcare Providers: IncredibleEmployment.be  This test is not yet approved or cleared by the [place] FDA and has been authorized for detection and/or diagnosis of SARS-CoV-2 by FDA under an Emergency Use Authorization (EUA). This EUA will remain in effect (meaning this test can be used) for the duration of the COVID-19 declaration under Section 564(b)(1) of the Act, 21 U.S.C. section 360bbb-3(b)(1), unless the authorization is terminated or revoked.  Performed at [facility], [address] [place], [place] [address]   MRSA Next Gen by PCR, Nasal     Status: None   Collection Time: 09/25/21  6:27 PM   Specimen: Nasal Mucosa; Nasal Swab  Result Value Ref Range Status   MRSA by PCR Next Gen NOT DETECTED NOT DETECTED Final    Comment: (NOTE) The GeneXpert MRSA Assay (FDA approved for NASAL specimens only), is one component of a comprehensive MRSA colonization surveillance program. It is not intended to diagnose MRSA infection nor to guide or monitor treatment for MRSA infections. Test performance is not FDA approved in patients less than 13 years old. Performed at [facility], [address] [place],  [address]   Expectorated Sputum Assessment w Gram Stain, Rflx to Resp  Cult     Status: None   Collection Time: 09/25/21  8:15 PM   Specimen: Expectorated Sputum  Result Value Ref Range Status   Specimen Description EXPECTORATED SPUTUM  Final   Special  Requests NONE  Final   Sputum evaluation   Final    THIS SPECIMEN IS ACCEPTABLE FOR SPUTUM CULTURE PERFORMED AT [facility] Performed at [facility], [address] [place], [place] [address]    Report Status 09/25/2021 FINAL  Final  Culture, Respiratory w Gram Stain     Status: None   Collection Time: 09/25/21  8:15 PM  Result Value Ref Range Status   Specimen Description   Final    EXPECTORATED SPUTUM Performed at [facility], [address] [place], [place] [address]    Special Requests   Final    NONE Reflexed from [id number] Performed at [facility], [address], [place], [place] [address]    Gram Stain   Final    FEW SQUAMOUS EPITHELIAL CELLS PRESENT RARE WBC PRESENT, PREDOMINANTLY PMN MODERATE GRAM POSITIVE COCCI FEW GRAM POSITIVE RODS    Culture   Final    FEW Normal respiratory flora-no Staph aureus or Pseudomonas seen Performed at [facility], [place] [address] [place], [place] [address]    Report Status 09/28/2021 FINAL  Final     Labs: BNP (last 3 results) Recent Labs    11/23/20 1013  BNP 92.1   Basic Metabolic Panel: Recent Labs  Lab 09/24/21 1029 09/24/21 1522 09/25/21 0553 09/26/21 0538  NA 133*  --  139 138  K 5.8* 3.4* 4.6 3.8  CL 102  --  101 103  CO2 22  --  27 27  GLUCOSE 140*  --  167* 174*  BUN 22  --  23 28*  CREATININE 0.89  --  0.95 0.83  CALCIUM 8.8*  --  9.5 9.3  MG  --   --   --  2.1   Liver Function Tests: Recent Labs  Lab 09/25/21 0553  AST 26  ALT 29  ALKPHOS 62  BILITOT 0.9  PROT 7.3  ALBUMIN 3.6   No results for input(s): LIPASE, AMYLASE in the last 168 hours. No results for input(s): AMMONIA in the last 168 hours. CBC: Recent Labs  Lab 09/24/21 1029 09/25/21 0553 09/26/21 0538  WBC 10.6* 17.4* 21.0*  NEUTROABS 7.4 15.4* 18.6*   HGB 15.0 15.2 13.8  HCT 44.2 46.1 42.0  MCV 92.9 92.2 93.1  PLT 232 256 268   Cardiac Enzymes: No results for input(s): CKTOTAL, CKMB, CKMBINDEX, TROPONINI in the last 168 hours. BNP: Invalid input(s): POCBNP CBG: No results for input(s): GLUCAP in the last 168 hours. D-Dimer No results for input(s): DDIMER in the last 72 hours. Hgb A1c No results for input(s): HGBA1C in the last 72 hours. Lipid Profile No results for input(s): CHOL, HDL, LDLCALC, TRIG, CHOLHDL, LDLDIRECT in the last 72 hours. Thyroid function studies No results for input(s): TSH, T4TOTAL, T3FREE, THYROIDAB in the last 72 hours.  Invalid input(s): FREET3 Anemia work up No results for input(s): VITAMINB12, FOLATE, FERRITIN, TIBC, IRON, RETICCTPCT in the last 72 hours. Urinalysis    Component Value Date/Time   COLORURINE YELLOW 11/11/2018 1657   APPEARANCEUR CLEAR 11/11/2018 1657   LABSPEC 1.015 11/11/2018 1657   PHURINE 6.5 11/11/2018 1657   GLUCOSEU NEGATIVE 11/11/2018 1657   HGBUR NEGATIVE 11/11/2018 1657   BILIRUBINUR NEGATIVE 01/22/2016 0613   KETONESUR NEGATIVE 11/11/2018 1657   PROTEINUR NEGATIVE 11/11/2018 1657   NITRITE NEGATIVE 11/11/2018 1657   LEUKOCYTESUR NEGATIVE 11/11/2018 1657   Sepsis Labs Invalid input(s): PROCALCITONIN,  WBC,  LACTICIDVEN Microbiology Recent Results (from the past 240 hour(s))  Resp Panel by RT-PCR (  Flu A&B, Covid) Nasopharyngeal Swab     Status: None   Collection Time: 09/24/21 11:49 AM   Specimen: Nasopharyngeal Swab; Nasopharyngeal(NP) swabs in vial transport medium  Result Value Ref Range Status   SARS Coronavirus 2 by RT PCR NEGATIVE NEGATIVE Final    Comment: (NOTE) SARS-CoV-2 target nucleic acids are NOT DETECTED.  The SARS-CoV-2 RNA is generally detectable in upper respiratory specimens during the acute phase of infection. The lowest concentration of SARS-CoV-2 viral copies this assay can detect is 138 copies/mL. A negative result does not preclude  SARS-Cov-2 infection and should not be used as the sole basis for treatment or other patient management decisions. A negative result may occur with  improper specimen collection/handling, submission of specimen other than nasopharyngeal swab, presence of viral mutation(s) within the areas targeted by this assay, and inadequate number of viral copies(<138 copies/mL). A negative result must be combined with clinical observations, patient history, and epidemiological information. The expected result is Negative.  Fact Sheet for Patients:  EntrepreneurPulse.com.au  Fact Sheet for Healthcare Providers:  IncredibleEmployment.be  This test is no t yet approved or cleared by the [place] FDA and  has been authorized for detection and/or diagnosis of SARS-CoV-2 by FDA under an Emergency Use Authorization (EUA). This EUA will remain  in effect (meaning this test can be used) for the duration of the COVID-19 declaration under Section 564(b)(1) of the Act, 21 U.S.C.section 360bbb-3(b)(1), unless the authorization is terminated  or revoked sooner.       Influenza A by PCR NEGATIVE NEGATIVE Final   Influenza B by PCR NEGATIVE NEGATIVE Final    Comment: (NOTE) The Xpert Xpress SARS-CoV-2/FLU/RSV plus assay is intended as an aid in the diagnosis of influenza from Nasopharyngeal swab specimens and should not be used as a sole basis for treatment. Nasal washings and aspirates are unacceptable for Xpert Xpress SARS-CoV-2/FLU/RSV testing.  Fact Sheet for Patients: EntrepreneurPulse.com.au  Fact Sheet for Healthcare Providers: IncredibleEmployment.be  This test is not yet approved or cleared by the [place] FDA and has been authorized for detection and/or diagnosis of SARS-CoV-2 by FDA under an Emergency Use Authorization (EUA). This EUA will remain in effect (meaning this test can be used) for the duration of  the COVID-19 declaration under Section 564(b)(1) of the Act, 21 U.S.C. section 360bbb-3(b)(1), unless the authorization is terminated or revoked.  Performed at [facility], [address] [place], [place] [address]   MRSA Next Gen by PCR, Nasal     Status: None   Collection Time: 09/25/21  6:27 PM   Specimen: Nasal Mucosa; Nasal Swab  Result Value Ref Range Status   MRSA by PCR Next Gen NOT DETECTED NOT DETECTED Final    Comment: (NOTE) The GeneXpert MRSA Assay (FDA approved for NASAL specimens only), is one component of a comprehensive MRSA colonization surveillance program. It is not intended to diagnose MRSA infection nor to guide or monitor treatment for MRSA infections. Test performance is not FDA approved in patients less than 74 years old. Performed at [facility], [address] [place], [place] [address]   Expectorated Sputum Assessment w Gram Stain, Rflx to Resp Cult     Status: None   Collection Time: 09/25/21  8:15 PM   Specimen: Expectorated Sputum  Result Value Ref Range Status   Specimen Description EXPECTORATED SPUTUM  Final   Special Requests NONE  Final   Sputum evaluation   Final    THIS SPECIMEN IS ACCEPTABLE FOR SPUTUM CULTURE  PERFORMED AT [facility] Performed at [facility], [address], [place], [place] [address]    Report Status 09/25/2021 FINAL  Final  Culture, Respiratory w Gram Stain     Status: None   Collection Time: 09/25/21  8:15 PM  Result Value Ref Range Status   Specimen Description   Final    EXPECTORATED SPUTUM Performed at [facility], [address], [place], [place] [address]    Special Requests   Final    NONE Reflexed from [id number] Performed at [facility], [address], [place], [place] [address]    Gram Stain   Final    FEW SQUAMOUS EPITHELIAL CELLS PRESENT RARE WBC PRESENT, PREDOMINANTLY PMN MODERATE GRAM POSITIVE COCCI FEW GRAM POSITIVE RODS    Culture   Final    FEW Normal respiratory flora-no Staph aureus or Pseudomonas  seen Performed at [facility], [place] [address] [place], [place] [address]    Report Status 09/28/2021 FINAL  Final   Time coordinating discharge: 35 mins   SIGNED:  Irwin Brakeman, MD  [facility] 09/29/2021, 10:28 AM How to contact the [facility] Attending or Consulting provider [place] or covering provider during after hours [place], for this patient?  Check the care team in [facility] and look for a) attending/consulting [facility] provider listed and b) the [facility] team listed Log into [email or web address] and use [facility]'s universal password to access. If you do not have the password, please contact the hospital operator. Locate the [facility] provider you are looking for under [facility] and page to a number that you can be directly reached. If you still have difficulty reaching the provider, please page the [facility] (Director on Call) for the Hospitalists listed on amion for assistance.

## 2021-09-30 ENCOUNTER — Telehealth: Payer: Self-pay

## 2021-09-30 NOTE — Telephone Encounter (Signed)
Transition Care Management Follow-up Telephone Call Date of discharge and from where: 09/29/2021 [facility]  Diagnosis: COPD Exacerbation How have you been since you were released from the hospital? Doing well Any questions or concerns? No  Items Reviewed: Did the pt receive and understand the discharge instructions provided? Yes  Medications obtained and verified? Yes  Other? No  Any new allergies since your discharge? No  Dietary orders reviewed? Yes Do you have support at home? Yes   Home Care and Equipment/Supplies: Were home health services ordered? no If so, what is the name of the agency? N/a  Has the agency set up a time to come to the patient's home? not applicable Were any new equipment or medical supplies ordered?  No - already on Oxygen around the clock 2LPM What is the name of the medical supply agency? N/a Were you able to get the supplies/equipment? not applicable Do you have any questions related to the use of the equipment or supplies? No  Functional Questionnaire: (I = Independent and D = Dependent) ADLs: I  Bathing/Dressing- I  Meal Prep- I  Eating- I  Maintaining continence- I  Transferring/Ambulation- I  Managing Meds- I  Follow up appointments reviewed:  PCP Hospital f/u appt confirmed? Yes  Scheduled to see [facility] on 10/04/21 @ 11. [facility] f/u appt confirmed? Yes  Scheduled to see Constance Haw, surgeon on 11/9/. Are transportation arrangements needed? No  If their condition worsens, is the pt aware to call PCP or go to the Emergency Dept.? Yes Was the patient provided with contact information for the PCP's office or ED? Yes Was to pt encouraged to call back with questions or concerns? Yes

## 2021-10-01 DIAGNOSIS — J441 Chronic obstructive pulmonary disease with (acute) exacerbation: Secondary | ICD-10-CM | POA: Diagnosis not present

## 2021-10-01 DIAGNOSIS — J449 Chronic obstructive pulmonary disease, unspecified: Secondary | ICD-10-CM | POA: Diagnosis not present

## 2021-10-02 ENCOUNTER — Institutional Professional Consult (permissible substitution): Payer: Medicare HMO | Admitting: Internal Medicine

## 2021-10-02 NOTE — Progress Notes (Deleted)
   Michael Evans, male    DOB: 09/17/1941, 80 y.o.   MRN: [id number]   Brief patient profile:      History of Present Illness  10/02/2021  Pulmonary/ 1st office eval/ Melvyn Novas / [place] [facility]  No chief complaint on file.    Dyspnea:  *** Cough: *** Sleep: *** SABA use:   Past Medical History:  Diagnosis Date   Arthritis    Chronic rhinitis    COPD (chronic obstructive pulmonary disease) (HCC)    PFT 06-26-09 FEV1  1.75( 71%), FVC 3.65( 101%), FEV1% 48, TLC 5.53(104%), DLCO 55%, no BD   Hypertension    Nasal septal deviation    Nocturia    On home O2    2L N/C   OSA (obstructive sleep apnea) 04/21/2011   Skin cancer     Outpatient Medications Prior to Visit  Medication Sig Dispense Refill   albuterol (VENTOLIN HFA) 108 (90 Base) MCG/ACT inhaler Inhale 1-2 puffs into the lungs every 6 (six) hours as needed for wheezing or shortness of breath. 1 each 3   Ascorbic Acid (VITAMIN C) 1000 MG tablet Take 1,000 mg by mouth daily.     budesonide-formoterol (SYMBICORT) 160-4.5 MCG/ACT inhaler TAKE 2 PUFFS BY MOUTH TWICE A DAY 30.6 each 5   cholecalciferol (VITAMIN D) 25 MCG (1000 UNIT) tablet Take 3,000 Units by mouth daily.     cyanocobalamin 2000 MCG tablet Take 1,000 mcg by mouth daily.     doxycycline (VIBRA-TABS) 100 MG tablet Take 1 tablet (100 mg total) by mouth every 12 (twelve) hours for 3 days. 6 tablet 0   guaiFENesin (MUCINEX) 600 MG 12 hr tablet Take 2 tablets (1,200 mg total) by mouth 2 (two) times daily for 5 days. 20 tablet 0   ipratropium-albuterol (DUONEB) 0.5-2.5 (3) MG/3ML SOLN Take 3 mLs by nebulization 3 (three) times daily. 360 mL 1   Multiple Vitamin (MULTIVITAMIN) tablet Take 1 tablet by mouth daily.     Omega-3 Fatty Acids (FISH OIL CONCENTRATE PO) Take 1 capsule by mouth daily.     OXYGEN Inhale 2 L into the lungs continuous.     predniSONE (DELTASONE) 20 MG tablet Take 3 PO QAM x5days, 2 PO QAM x5days, 1 PO QAM x5days 30 tablet 0   pyridoxine (B-6) 100 MG  tablet Take 100 mg by mouth daily.     zinc sulfate 220 (50 Zn) MG capsule Take 220 mg by mouth daily.     No facility-administered medications prior to visit.     Objective:     There were no vitals taken for this visit.         Assessment   No problem-specific Assessment & Plan notes found for this encounter.     Christinia Gully, MD 10/02/2021

## 2021-10-04 ENCOUNTER — Ambulatory Visit (INDEPENDENT_AMBULATORY_CARE_PROVIDER_SITE_OTHER): Payer: Medicare HMO | Admitting: Family Medicine

## 2021-10-04 ENCOUNTER — Other Ambulatory Visit: Payer: Self-pay

## 2021-10-04 ENCOUNTER — Encounter: Payer: Self-pay | Admitting: Family Medicine

## 2021-10-04 ENCOUNTER — Telehealth: Payer: Self-pay

## 2021-10-04 VITALS — BP 138/82 | HR 86 | Temp 98.2°F | Resp 18 | Ht 65.0 in | Wt 139.0 lb

## 2021-10-04 DIAGNOSIS — R410 Disorientation, unspecified: Secondary | ICD-10-CM

## 2021-10-04 DIAGNOSIS — E875 Hyperkalemia: Secondary | ICD-10-CM | POA: Diagnosis not present

## 2021-10-04 DIAGNOSIS — J441 Chronic obstructive pulmonary disease with (acute) exacerbation: Secondary | ICD-10-CM

## 2021-10-04 DIAGNOSIS — K4090 Unilateral inguinal hernia, without obstruction or gangrene, not specified as recurrent: Secondary | ICD-10-CM | POA: Diagnosis not present

## 2021-10-04 LAB — CBC WITH DIFFERENTIAL/PLATELET
Absolute Monocytes: 1628 cells/uL — ABNORMAL HIGH (ref 200–950)
Basophils Absolute: 74 cells/uL (ref 0–200)
Basophils Relative: 0.4 %
Eosinophils Absolute: 0 cells/uL — ABNORMAL LOW (ref 15–500)
Eosinophils Relative: 0 %
HCT: 50 % (ref 38.5–50.0)
Hemoglobin: 17 g/dL (ref 13.2–17.1)
Lymphs Abs: 2294 cells/uL (ref 850–3900)
MCH: 30.7 pg (ref 27.0–33.0)
MCHC: 34 g/dL (ref 32.0–36.0)
MCV: 90.3 fL (ref 80.0–100.0)
MPV: 10.4 fL (ref 7.5–12.5)
Monocytes Relative: 8.8 %
Neutro Abs: 14504 cells/uL — ABNORMAL HIGH (ref 1500–7800)
Neutrophils Relative %: 78.4 %
Platelets: 338 10*3/uL (ref 140–400)
RBC: 5.54 10*6/uL (ref 4.20–5.80)
RDW: 13 % (ref 11.0–15.0)
Total Lymphocyte: 12.4 %
WBC: 18.5 10*3/uL — ABNORMAL HIGH (ref 3.8–10.8)

## 2021-10-04 LAB — COMPLETE METABOLIC PANEL WITH GFR
AG Ratio: 1.7 (calc) (ref 1.0–2.5)
ALT: 31 U/L (ref 9–46)
AST: 16 U/L (ref 10–35)
Albumin: 4 g/dL (ref 3.6–5.1)
Alkaline phosphatase (APISO): 49 U/L (ref 35–144)
BUN/Creatinine Ratio: 37 (calc) — ABNORMAL HIGH (ref 6–22)
BUN: 38 mg/dL — ABNORMAL HIGH (ref 7–25)
CO2: 30 mmol/L (ref 20–32)
Calcium: 10.4 mg/dL — ABNORMAL HIGH (ref 8.6–10.3)
Chloride: 98 mmol/L (ref 98–110)
Creat: 1.02 mg/dL (ref 0.70–1.22)
Globulin: 2.4 g/dL (calc) (ref 1.9–3.7)
Glucose, Bld: 86 mg/dL (ref 65–99)
Potassium: 5.2 mmol/L (ref 3.5–5.3)
Sodium: 136 mmol/L (ref 135–146)
Total Bilirubin: 0.7 mg/dL (ref 0.2–1.2)
Total Protein: 6.4 g/dL (ref 6.1–8.1)
eGFR: 73 mL/min/{1.73_m2} (ref >=60)

## 2021-10-04 MED ORDER — NEBULIZER MISC: 1.0000 | Freq: Every day | 3 refills | Status: DC | PRN

## 2021-10-04 NOTE — Telephone Encounter (Signed)
Pt requesting nebulizer tubing/supplies sent to [facility]. Rx sent as requested.

## 2021-10-04 NOTE — Progress Notes (Signed)
Subjective:    Patient ID: Michael Evans, male    DOB: 03-16-1941, 80 y.o.   MRN: [id number]  Admit date: 09/24/2021 Discharge date: 09/29/2021   Admitted From:  Home  Disposition: Home    Recommendations for Outpatient Follow-up:  Follow up with PCP in 1 weeks Follow up with [facility]      Discharge Condition: STABLE   CODE STATUS: FULL DIET: Heart Healthy     Brief Hospitalization Summary: Please see all hospital notes, images, labs for full details of the hospitalization. ADMISSION HPI: Michael Evans is a 80 y.o. male with medical history significant of COPD, chronic respiratory failure on 2 L, hypertension, arthritis presented with acute on chronic respiratory failure with hypoxia and COPD exacerbation.  Patient noted to have been seen in the ER back on October 18 for similar symptoms.  Was diagnosed with a COPD exacerbation.  Per report, patient was placed on a Z-Pak.  Symptoms have minimally improved over the course of this treatment.  Unclear if patient got steroids with evaluation.  Per the patient, he has had increased cough, wheezing, mild sputum production.  No chest pain.  No nausea or vomiting.  Patient does attribute the flare to likely weather changes.  Patient states that symptoms got worse once the vents were closed in his house.  No fevers or chills.  No reported sick contacts.  Patient denies smoking.  Per the sister, Michael Evans, patient quit smoking years ago.   ED Course: Presented to the ER afebrile, hemodynamically stable.  Requiring 2 to 4 L nasal cannula for oxygenation.  COVID-negative.  Labs notable for K of 5.8.  Resolved after albuterol treatment and Lokelma with a potassium of 3.4.  Troponin and EKG within normal limits.  Chest x-ray within normal limits.    HOSPITAL COURSE by problem list   Acute on chronic respiratory failure with hypoxia secondary to COPD exacerbation -Patient was treated with bronchodilators, IV steroids, antibiotics and now  feeling back to his baseline.  DC home with outpatient follow up.  He will discharge home on prednisone taper.  Ambulatory referral to [facility].  He has not seen them in several years.     Severe COPD - Patient has diffuse if for emphysematous changes on x-ray - see above recommendations for acute exacerbation  -Sputum culture reviewed normal respiratory flora no staph or Pseudomonas noted He will discharge home on prednisone taper and ambulatory referral to [facility] pulmonary clinic.     Essential Hypertension  -His blood pressure is managed with diet only, we are monitoring and it has been stable and controlled   OSA -We offered nightly CPAP in hospital       DVT prophylaxis: enoxaparin  Code Status: Full  Family Communication: sister by telephone  Disposition: anticipating DC home  Status is: Inpatient   Discharge Diagnoses:  Active Problems:   Essential hypertension, benign   OSA (obstructive sleep apnea)   COPD exacerbation (HCC)   Acute on chronic respiratory failure with hypoxia (HCC)   Hyperglycemia   Acute on chronic respiratory failure (HCC)   COPD (chronic obstructive pulmonary disease) [facility])     Discharge Instructions: Discharge Instructions       Ambulatory referral to Pulmonology   Complete by: As directed      Hospital Follow Up    Reason for referral: Asthma/COPD     10/04/21 Patient was recently admitted to the hospital with COPD exacerbation.  He is here today with his  family for follow-up.  The patient seems very confused compared to his baseline.  He has a difficult time answering any of my questions.  He is awake and alert.  He is not delirious.  However he is unable to answer the majority of my questions.  For instance, he is unable to tell me how he is taking his prednisone or how often he is taking his prednisone.  He is unable to tell me how often he is using his rescue nebulizer treatment.  He is unable to tell me if he still has antibiotics  left.  This is certainly not near the patient's baseline.  He is now staying with his sister due to his confusion to convalesce after returning home from the hospital.  He states that his breathing is improving.  Today on exam, his lungs sound very good for his baseline.  He has diminished breath sounds throughout but there are no wheezes crackles or rails.  He is not struggling to breathe and his pulse oximetry is 100% on 2 L.  Again however, he is not at his baseline mental status.  He is able to tell me the year.  He is able to tell me the day of the week.  However he is unable to tell me what month we are in and he is unable to tell me my name.  He also is here complaining of pain in his left anal canal.  He has a large left inguinal hernia that is easily reproducible.  He states that this started hurting him about a week before going to the hospital.  There is no evidence of incarceration today. Past Medical History:  Diagnosis Date  . Arthritis   . Chronic rhinitis   . COPD (chronic obstructive pulmonary disease) (HCC)    PFT 06-26-09 FEV1  1.75( 71%), FVC 3.65( 101%), FEV1% 48, TLC 5.53(104%), DLCO 55%, no BD  . Hypertension   . Nasal septal deviation   . Nocturia   . On home O2    2L N/C  . OSA (obstructive sleep apnea) 04/21/2011  . Skin cancer    Past Surgical History:  Procedure Laterality Date  . APPENDECTOMY    . BACK SURGERY    . CATARACT EXTRACTION W/PHACO  01/06/2013   Procedure: CATARACT EXTRACTION PHACO AND INTRAOCULAR LENS PLACEMENT (IOC);  Surgeon: Tonny Branch, MD;  Location: [facility];  Service: Ophthalmology;  Laterality: Right;  CDE: 22.17   Current Outpatient Medications on File Prior to Visit  Medication Sig Dispense Refill  . albuterol (VENTOLIN HFA) 108 (90 Base) MCG/ACT inhaler Inhale 1-2 puffs into the lungs every 6 (six) hours as needed for wheezing or shortness of breath. 1 each 3  . Ascorbic Acid (VITAMIN C) 1000 MG tablet Take 1,000 mg by mouth daily.    .  budesonide-formoterol (SYMBICORT) 160-4.5 MCG/ACT inhaler TAKE 2 PUFFS BY MOUTH TWICE A DAY 30.6 each 5  . cholecalciferol (VITAMIN D) 25 MCG (1000 UNIT) tablet Take 3,000 Units by mouth daily.    . cyanocobalamin 2000 MCG tablet Take 1,000 mcg by mouth daily.    Marland Kitchen guaiFENesin (MUCINEX) 600 MG 12 hr tablet Take 2 tablets (1,200 mg total) by mouth 2 (two) times daily for 5 days. 20 tablet 0  . ipratropium-albuterol (DUONEB) 0.5-2.5 (3) MG/3ML SOLN Take 3 mLs by nebulization 3 (three) times daily. 360 mL 1  . Multiple Vitamin (MULTIVITAMIN) tablet Take 1 tablet by mouth daily.    . Omega-3 Fatty Acids (FISH OIL  CONCENTRATE PO) Take 1 capsule by mouth daily.    . OXYGEN Inhale 2 L into the lungs continuous.    . predniSONE (DELTASONE) 20 MG tablet Take 3 PO QAM x5days, 2 PO QAM x5days, 1 PO QAM x5days 30 tablet 0  . pyridoxine (B-6) 100 MG tablet Take 100 mg by mouth daily.    Marland Kitchen zinc sulfate 220 (50 Zn) MG capsule Take 220 mg by mouth daily.     No current facility-administered medications on file prior to visit.   Allergies  Allergen Reactions  . Amlodipine Swelling    Swelling in feet  . Aspirin Other (See Comments)    Upset stomach.  . Other Hypertension    Hazelnuts  . Penicillins Hives    Has patient had a PCN reaction causing immediate rash, facial/tongue/throat swelling, SOB or lightheadedness with hypotension: Yes Has patient had a PCN reaction causing severe rash involving mucus membranes or skin necrosis: No Has patient had a PCN reaction that required hospitalization No Has patient had a PCN reaction occurring within the last 10 years: No If all of the above answers are "NO", then may proceed with Cephalosporin use.   . Sulfa Antibiotics Hives   Social History   Socioeconomic History  . Marital status: Divorced    Spouse name: Not on file  . Number of children: Not on file  . Years of education: Not on file  . Highest education level: Not on file  Occupational History   . Occupation: Neurosurgeon: RETIRED  Tobacco Use  . Smoking status: Former    Packs/day: 1.50    Years: 54.00    Pack years: 81.00    Types: Cigarettes    Quit date: 12/01/2001    Years since quitting: 19.8  . Smokeless tobacco: Never  Vaping Use  . Vaping Use: Never used  Substance and Sexual Activity  . Alcohol use: No  . Drug use: No  . Sexual activity: Not on file  Other Topics Concern  . Not on file  Social History Narrative  . Not on file   Social Determinants of Health   Financial Resource Strain: Not on file  Food Insecurity: Not on file  Transportation Needs: Not on file  Physical Activity: Not on file  Stress: Not on file  Social Connections: Not on file  Intimate Partner Violence: Not on file      Review of Systems  Respiratory:  Positive for cough.   All other systems reviewed and are negative.     Objective:   Physical Exam Vitals reviewed.  Constitutional:      Appearance: Normal appearance. He is normal weight.  Cardiovascular:     Rate and Rhythm: Normal rate and regular rhythm.     Heart sounds: Normal heart sounds. No murmur heard. Pulmonary:     Effort: Pulmonary effort is normal. No respiratory distress.     Breath sounds: Decreased breath sounds present. No wheezing, rhonchi or rales.  Abdominal:     General: Bowel sounds are normal. There is no distension.     Palpations: Abdomen is soft.     Tenderness: There is no abdominal tenderness. There is no guarding or rebound.     Hernia: A hernia is present.    Neurological:     Mental Status: He is alert.    Hyperkalemia - Plan: CBC with Differential/Platelet, COMPLETE METABOLIC PANEL WITH GFR  Confusion  COPD exacerbation (HCC)  Unilateral inguinal hernia without obstruction  or gangrene, recurrence not specified I am most concerned by his confusion.  I believe his delirium from his recent hospitalization.  I have asked him to increase his duo nebs to every 6 hours and  to continue his Symbicort.  I would like to see him back in 1 week having completed all of his antibiotics and all of his steroids.  At that time hopefully, the patient will be approaching his baseline mental status.  If not, we may want to pursue imaging at that time to see if he has suffered a mild stroke.  However there is no obvious neurologic deficit on exam.  He does have a large left inguinal hernia that is easily reproducible today with no evidence of obstruction.  I recommended a hernia belt.  Patient will be a poor surgical candidate at the current moment due to his delirium and his age and his COPD.  Therefore I have not encouraged surgical correction at this time.  I will check a CMP and a CBC today to follow-up with hyperkalemia from the hospital.  I believe that that was due primarily to dehydration and his illness.  Reassess in 1 week or sooner if worsening      Assessment & Plan:  Hyperkalemia - Plan: CBC with Differential/Platelet, COMPLETE METABOLIC PANEL WITH GFR  Patient received 80 mg of Depo-Medrol x1 now.  Begin prednisone 60 mg a day for the next week.  Start Avelox 400 mg p.o. daily for the next week given his increasing and changing sputum consistency.  Use albuterol every 4-6 hours for wheezing.  Continue home oxygen.  If breathing worsens, he is instructed to go the emergency room

## 2021-10-07 ENCOUNTER — Telehealth: Payer: Self-pay | Admitting: *Deleted

## 2021-10-07 DIAGNOSIS — J441 Chronic obstructive pulmonary disease with (acute) exacerbation: Secondary | ICD-10-CM | POA: Diagnosis not present

## 2021-10-07 NOTE — Telephone Encounter (Signed)
Received call from [facility].   Reports that order was received for nebulizer. Patient has [employer] and is out of network.   New orders sent to [facility].   Call placed to patient sister, Altha Harm to make aware.

## 2021-10-08 ENCOUNTER — Ambulatory Visit (INDEPENDENT_AMBULATORY_CARE_PROVIDER_SITE_OTHER): Payer: Medicare HMO | Admitting: General Surgery

## 2021-10-08 ENCOUNTER — Encounter: Payer: Self-pay | Admitting: General Surgery

## 2021-10-08 ENCOUNTER — Other Ambulatory Visit: Payer: Self-pay

## 2021-10-08 VITALS — BP 159/79 | HR 75 | Temp 98.4°F | Resp 18 | Ht 65.0 in | Wt 137.0 lb

## 2021-10-08 DIAGNOSIS — K4091 Unilateral inguinal hernia, without obstruction or gangrene, recurrent: Secondary | ICD-10-CM | POA: Insufficient documentation

## 2021-10-08 DIAGNOSIS — K4090 Unilateral inguinal hernia, without obstruction or gangrene, not specified as recurrent: Secondary | ICD-10-CM | POA: Diagnosis not present

## 2021-10-08 DIAGNOSIS — K4190 Unilateral femoral hernia, without obstruction or gangrene, not specified as recurrent: Secondary | ICD-10-CM | POA: Insufficient documentation

## 2021-10-08 NOTE — Patient Instructions (Signed)
Will see you in 1 month to see how you are doing. Wear your belt and watch the area. See the pulmonologist and I will send them a message about risk of surgery for you.   Inguinal Hernia, Adult An inguinal hernia is when fat or your intestines push through a weak spot in a muscle where your leg meets your lower belly (groin). This causes a bulge. This kind of hernia could also be: In your scrotum, if you are male. In folds of skin around your vagina, if you are male. There are three types of inguinal hernias: Hernias that can be pushed back into the belly (are reducible). This type rarely causes pain. Hernias that cannot be pushed back into the belly (are incarcerated). Hernias that cannot be pushed back into the belly and lose their blood supply (are strangulated). This type needs emergency surgery. What are the causes? This condition is caused by having a weak spot in the muscles or tissues in your groin. This develops over time. The hernia may poke through the weak spot when you strain your lower belly muscles all of a sudden, such as when you: Lift a heavy object. Strain to poop (have a bowel movement). Trouble pooping (constipation) can lead to straining. Cough. What increases the risk? This condition is more likely to develop in: Males. Pregnant females. People who: Are overweight. Work in jobs that require long periods of standing or heavy lifting. Have had an inguinal hernia before. Smoke or have lung disease. These factors can lead to long-term (chronic) coughing. What are the signs or symptoms? Symptoms may depend on the size of the hernia. Often, a small hernia has no symptoms. Symptoms of a larger hernia may include: A bulge in the groin area. This is easier to see when standing. You might not be able to see it when you are lying down. Pain or burning in the groin. This may get worse when you lift, strain, or cough. A dull ache or a feeling of pressure in the groin. An  abnormal bulge in the scrotum, in males. Symptoms of a strangulated inguinal hernia may include: A bulge in your groin that is very painful and tender to the touch. A bulge that turns red or purple. Fever, feeling like you may vomit (nausea), and vomiting. Not being able to poop or to pass gas. How is this treated? Treatment depends on the size of your hernia and whether you have symptoms. If you do not have symptoms, your doctor may have you watch your hernia carefully and have you come in for follow-up visits. If your hernia is large or if you have symptoms, you may need surgery to repair the hernia. Follow these instructions at home: Lifestyle Avoid lifting heavy objects. Avoid standing for long amounts of time. Do not smoke or use any products that contain nicotine or tobacco. If you need help quitting, ask your doctor. Stay at a healthy weight. Prevent trouble pooping You may need to take these actions to prevent or treat trouble pooping: Drink enough fluid to keep your pee (urine) pale yellow. Take over-the-counter or prescription medicines. Eat foods that are high in fiber. These include beans, whole grains, and fresh fruits and vegetables. Limit foods that are high in fat and sugar. These include fried or sweet foods. General instructions You may try to push your hernia back in place by very gently pressing on it when you are lying down. Do not try to push the bulge back in if  it will not go in easily. Watch your hernia for any changes in shape, size, or color. Tell your doctor if you see any changes. Take over-the-counter and prescription medicines only as told by your doctor. Keep all follow-up visits. Contact a doctor if: You have a fever or chills. You have new symptoms. Your symptoms get worse. Get help right away if: You have pain in your groin that gets worse all of a sudden. You have a bulge in your groin that: Gets bigger all of a sudden, and it does not get smaller  after that. Turns red or purple. Is painful when you touch it. You are a male, and you have: Sudden pain in your scrotum. A sudden change in the size of your scrotum. You cannot push the hernia back in place by very gently pressing on it when you are lying down. You feel like you may vomit, and that feeling does not go away. You keep vomiting. You have a fast heartbeat. You cannot poop or pass gas. These symptoms may be an emergency. Get help right away. Call your local emergency services (911 in the [place] Do not wait to see if the symptoms will go away. Do not drive yourself to the hospital. Summary An inguinal hernia is when fat or your intestines push through a weak spot in a muscle where your leg meets your lower belly (groin). This causes a bulge. If you do not have symptoms, you may not need treatment. If you have symptoms or a large hernia, you may need surgery. Avoid lifting heavy objects. Also, avoid standing for long amounts of time. Do not try to push the bulge back in if it will not go in easily. This information is not intended to replace advice given to you by your health care provider. Make sure you discuss any questions you have with your health care provider. Document Revised: 07/17/2020 Document Reviewed: 07/17/2020 Elsevier Patient Education  2022 [employer].

## 2021-10-08 NOTE — Progress Notes (Signed)
[facility] History and Physical  Reason for Referral: Left inguinal hernia  Referring Physician:  ED   Chief Complaint   New Patient (Initial Visit)     Michael Evans is a 80 y.o. male.  HPI: Michael Evans is a 80 yo with COPD, on O2 2L, HTN, who is currently on a steroid taper for an exacerbation where he was admitted to the hospital. He sees Michael Evans in early December.  He has noticed a hernia on the left side for about 1 month now. It is sticking out and does cause some minor discomfort at times. He is worried about his ability to lift. He is wearing a hernia belt now which does help some. He is also nervous about needing any surgery given his pulmonary disease. He has not had any obstructive type symptoms.   Past Medical History:  Diagnosis Date   Arthritis    Chronic rhinitis    COPD (chronic obstructive pulmonary disease) ([place])    PFT 06-26-09 FEV1  1.75( 71%), FVC 3.65( 101%), FEV1% 48, TLC 5.53(104%), DLCO 55%, no BD   Hypertension    Nasal septal deviation    Nocturia    On home O2    2L N/C   OSA (obstructive sleep apnea) 04/21/2011   Skin cancer     Past Surgical History:  Procedure Laterality Date   APPENDECTOMY     BACK SURGERY     CATARACT EXTRACTION W/PHACO  01/06/2013   Procedure: CATARACT EXTRACTION PHACO AND INTRAOCULAR LENS PLACEMENT ([place]);  Surgeon: Michael Branch, MD;  Location: [facility];  Service: Ophthalmology;  Laterality: Right;  CDE: 22.17    Family History  Problem Relation Age of Onset   Lung cancer Mother     Social History   Tobacco Use   Smoking status: Former    Packs/day: 1.50    Years: 54.00    Pack years: 81.00    Types: Cigarettes    Quit date: 12/01/2001    Years since quitting: 19.8   Smokeless tobacco: Never  Vaping Use   Vaping Use: Never used  Substance Use Topics   Alcohol use: No   Drug use: No    Medications: I have reviewed the patient's current medications. Allergies as of 10/08/2021       Reactions    Amlodipine Swelling   Swelling in feet   Aspirin Other (See Comments)   Upset stomach.   Other Hypertension   Hazelnuts   Penicillins Hives   Has patient had a PCN reaction causing immediate rash, facial/tongue/throat swelling, SOB or lightheadedness with hypotension: Yes Has patient had a PCN reaction causing severe rash involving mucus membranes or skin necrosis: No Has patient had a PCN reaction that required hospitalization No Has patient had a PCN reaction occurring within the last 10 years: No If all of the above answers are "NO", then may proceed with Cephalosporin use.   Sulfa Antibiotics Hives        Medication List        Accurate as of October 08, 2021  9:42 AM. If you have any questions, ask your nurse or doctor.          albuterol 108 (90 Base) MCG/ACT inhaler Commonly known as: VENTOLIN HFA Inhale 1-2 puffs into the lungs every 6 (six) hours as needed for wheezing or shortness of breath.   budesonide-formoterol 160-4.5 MCG/ACT inhaler Commonly known as: Symbicort TAKE 2 PUFFS BY MOUTH TWICE A DAY   cholecalciferol 25  MCG (1000 UNIT) tablet Commonly known as: VITAMIN D Take 3,000 Units by mouth daily.   cyanocobalamin 2000 MCG tablet Take 1,000 mcg by mouth daily.   FISH OIL CONCENTRATE PO Take 1 capsule by mouth daily.   ipratropium-albuterol 0.5-2.5 (3) MG/3ML Soln Commonly known as: DUONEB Take 3 mLs by nebulization 3 (three) times daily.   multivitamin tablet Take 1 tablet by mouth daily.   Nebulizer Misc 1 each by Does not apply route daily as needed. PLEASE PROVIDE NEBULIZER KIT TO INCLUDE TUBING/MASK/MOUTHPIECE   OXYGEN Inhale 2 L into the lungs continuous.   predniSONE 20 MG tablet Commonly known as: DELTASONE Take 3 PO QAM x5days, 2 PO QAM x5days, 1 PO QAM x5days   pyridoxine 100 MG tablet Commonly known as: B-6 Take 100 mg by mouth daily.   vitamin C 1000 MG tablet Take 1,000 mg by mouth daily.   zinc sulfate 220 (50 Zn) MG  capsule Take 220 mg by mouth daily.         ROS:  A comprehensive review of systems was negative except for: Respiratory: positive for cough, emphysema, wheezing, and SOB Genitourinary: positive for decreased stream and frequency  Blood pressure (!) 159/79, pulse 75, temperature 98.4 F (36.9 C), temperature source Other (Comment), resp. rate 18, height 5' 5"  (1.651 m), weight 137 lb (62.1 kg), SpO2 93 %. Physical Exam Vitals reviewed.  Constitutional:      Appearance: He is normal weight.  HENT:     Head: Normocephalic.     Nose: Nose normal.     Mouth/Throat:     Mouth: Mucous membranes are dry.  Eyes:     Extraocular Movements: Extraocular movements intact.  Cardiovascular:     Rate and Rhythm: Normal rate and regular rhythm.  Pulmonary:     Breath sounds: Wheezing present.     Comments: O2  dependent Abdominal:     General: There is no distension.     Palpations: Abdomen is soft.     Tenderness: There is no abdominal tenderness.     Hernia: A hernia is present. Hernia is present in the left inguinal area.     Comments: Reducible inguinal hernia, nontender  Musculoskeletal:        General: Normal range of motion.     Cervical back: No rigidity.  Skin:    General: Skin is warm.  Neurological:     General: No focal deficit present.     Mental Status: He is alert and oriented to person, place, and time.  Psychiatric:        Mood and Affect: Mood normal.        Behavior: Behavior normal.        Thought Content: Thought content normal.        Judgment: Judgment normal.    Results: None    Assessment & Plan:  Michael Evans is a 80 y.o. male with a left inguinal hernia that is reducible. He has COPD with chronic bronchitis and emphysema and had a recent exacerbation. He is on 2L O2 at home. He has not seen Pulmonology since 2015. He is seeing Michael Evans in December. Discussed risk of surgery and anesthesia with him and that the risk of this may out weight the benefit  of watching the hernia. Discussed reasons to proceed with repair such as incarceration and worsening symptoms. Discussed reasons for going to the ED.   Will send Michael Evans a message regarding the patient and risk with surgery  if it comes to needing an inguinal hernia repair Discussed with patient monitoring the hernia and wearing his belt  Will see back in December after he sees Michael Evans.   Future Appointments  Date Time Provider [place]  10/11/2021 11:15 AM [name], MD [facility] None  10/31/2021  9:15 AM Tanda Rockers, MD [facility] None  11/05/2021  9:45 AM [name], MD [facility] None    All questions were answered to the satisfaction of the patient.    [name] 10/08/2021, 9:42 AM

## 2021-10-11 ENCOUNTER — Ambulatory Visit (INDEPENDENT_AMBULATORY_CARE_PROVIDER_SITE_OTHER): Payer: Medicare HMO | Admitting: Family Medicine

## 2021-10-11 ENCOUNTER — Encounter: Payer: Self-pay | Admitting: Family Medicine

## 2021-10-11 ENCOUNTER — Other Ambulatory Visit: Payer: Self-pay

## 2021-10-11 VITALS — BP 136/84 | HR 98 | Temp 98.1°F | Resp 18 | Ht 65.0 in | Wt 137.0 lb

## 2021-10-11 DIAGNOSIS — R410 Disorientation, unspecified: Secondary | ICD-10-CM | POA: Diagnosis not present

## 2021-10-11 DIAGNOSIS — J441 Chronic obstructive pulmonary disease with (acute) exacerbation: Secondary | ICD-10-CM | POA: Diagnosis not present

## 2021-10-11 MED ORDER — PREDNISONE 20 MG PO TABS: 40.0000 mg | ORAL_TABLET | Freq: Every day | ORAL | 0 refills | Status: DC

## 2021-10-11 NOTE — Progress Notes (Signed)
Subjective:    Patient ID: Michael Evans, male    DOB: [birth year], 80 y.o.   MRN: [id number]  Admit date: 09/24/2021 Discharge date: 09/29/2021   Admitted From:  Home  Disposition: Home    Recommendations for Outpatient Follow-up:  Follow up with PCP in 1 weeks Follow up with [facility]      Discharge Condition: STABLE   CODE STATUS: FULL DIET: Heart Healthy     Brief Hospitalization Summary: Please see all hospital notes, images, labs for full details of the hospitalization. ADMISSION HPI: Michael Evans is a 80 y.o. male with medical history significant of COPD, chronic respiratory failure on 2 L, hypertension, arthritis presented with acute on chronic respiratory failure with hypoxia and COPD exacerbation.  Patient noted to have been seen in the ER back on October 18 for similar symptoms.  Was diagnosed with a COPD exacerbation.  Per report, patient was placed on a Z-Pak.  Symptoms have minimally improved over the course of this treatment.  Unclear if patient got steroids with evaluation.  Per the patient, he has had increased cough, wheezing, mild sputum production.  No chest pain.  No nausea or vomiting.  Patient does attribute the flare to likely weather changes.  Patient states that symptoms got worse once the vents were closed in his house.  No fevers or chills.  No reported sick contacts.  Patient denies smoking.  Per the sister, [name], patient quit smoking years ago.   ED Course: Presented to the ER afebrile, hemodynamically stable.  Requiring 2 to 4 L nasal cannula for oxygenation.  COVID-negative.  Labs notable for K of 5.8.  Resolved after albuterol treatment and Lokelma with a potassium of 3.4.  Troponin and EKG within normal limits.  Chest x-ray within normal limits.    HOSPITAL COURSE by problem list   Acute on chronic respiratory failure with hypoxia secondary to COPD exacerbation -Patient was treated with bronchodilators, IV steroids, antibiotics and now  feeling back to his baseline.  DC home with outpatient follow up.  He will discharge home on prednisone taper.  Ambulatory referral to [facility].  He has not seen them in several years.     Severe COPD - Patient has diffuse if for emphysematous changes on x-ray - see above recommendations for acute exacerbation  -Sputum culture reviewed normal respiratory flora no staph or Pseudomonas noted He will discharge home on prednisone taper and ambulatory referral to [facility] pulmonary clinic.     Essential Hypertension  -His blood pressure is managed with diet only, we are monitoring and it has been stable and controlled   OSA -We offered nightly CPAP in hospital       DVT prophylaxis: enoxaparin  Code Status: Full  Family Communication: sister by telephone  Disposition: anticipating DC home  Status is: Inpatient   Discharge Diagnoses:  Active Problems:   Essential hypertension, benign   OSA (obstructive sleep apnea)   COPD exacerbation (HCC)   Acute on chronic respiratory failure with hypoxia (HCC)   Hyperglycemia   Acute on chronic respiratory failure (HCC)   COPD (chronic obstructive pulmonary disease) [facility])     Discharge Instructions: Discharge Instructions       Ambulatory referral to Pulmonology   Complete by: As directed      Hospital Follow Up    Reason for referral: Asthma/COPD     10/04/21 Patient was recently admitted to the hospital with COPD exacerbation.  He is here today with his  family for follow-up.  The patient seems very confused compared to his baseline.  He has a difficult time answering any of my questions.  He is awake and alert.  He is not delirious.  However he is unable to answer the majority of my questions.  For instance, he is unable to tell me how he is taking his prednisone or how often he is taking his prednisone.  He is unable to tell me how often he is using his rescue nebulizer treatment.  He is unable to tell me if he still has antibiotics  left.  This is certainly not near the patient's baseline.  He is now staying with his sister due to his confusion to convalesce after returning home from the hospital.  He states that his breathing is improving.  Today on exam, his lungs sound very good for his baseline.  He has diminished breath sounds throughout but there are no wheezes crackles or rails.  He is not struggling to breathe and his pulse oximetry is 100% on 2 L.  Again however, he is not at his baseline mental status.  He is able to tell me the year.  He is able to tell me the day of the week.  However he is unable to tell me what month we are in and he is unable to tell me my name.  He also is here complaining of pain in his left anal canal.  He has a large left inguinal hernia that is easily reproducible.  He states that this started hurting him about a week before going to the hospital.  There is no evidence of incarceration today.  At that time, my plan was:  I am most concerned by his confusion.  I believe his delirium from his recent hospitalization.  I have asked him to increase his duo nebs to every 6 hours and to continue his Symbicort.  I would like to see him back in 1 week having completed all of his antibiotics and all of his steroids.  At that time hopefully, the patient will be approaching his baseline mental status.  If not, we may want to pursue imaging at that time to see if he has suffered a mild stroke.  However there is no obvious neurologic deficit on exam.  He does have a large left inguinal hernia that is easily reproducible today with no evidence of obstruction.  I recommended a hernia belt.  Patient will be a poor surgical candidate at the current moment due to his delirium and his age and his COPD.  Therefore I have not encouraged surgical correction at this time.  I will check a CMP and a CBC today to follow-up with hyperkalemia from the hospital.  I believe that that was due primarily to dehydration and his illness.   Reassess in 1 week or sooner if worsening   10/11/21 Patient is here today with his sister.  He remains extremely confused.  I asked him to explain how he is taking his inhalers and breathing medicine and he states 2-3 times a day.  He is unable to differentiate the difference between Symbicort and DuoNeb and his albuterol inhaler.  He has completed his antibiotics and steroids however continues to have a cough productive of thick yellow mucus.  The cough keeps him awake at night.  He continues to suffer from confusion and disorientation however he is still managing his medicines which concerns me. Past Medical History:  Diagnosis Date   Arthritis  Chronic rhinitis    COPD (chronic obstructive pulmonary disease) (HCC)    PFT 06-26-09 FEV1  1.75( 71%), FVC 3.65( 101%), FEV1% 48, TLC 5.53(104%), DLCO 55%, no BD   Hypertension    Nasal septal deviation    Nocturia    On home O2    2L N/C   OSA (obstructive sleep apnea) 04/21/2011   Skin cancer    Past Surgical History:  Procedure Laterality Date   APPENDECTOMY     BACK SURGERY     CATARACT EXTRACTION W/PHACO  01/06/2013   Procedure: CATARACT EXTRACTION PHACO AND INTRAOCULAR LENS PLACEMENT ([place]);  Surgeon: Tonny Branch, MD;  Location: [facility];  Service: Ophthalmology;  Laterality: Right;  CDE: 22.17   Current Outpatient Medications on File Prior to Visit  Medication Sig Dispense Refill   albuterol (VENTOLIN HFA) 108 (90 Base) MCG/ACT inhaler Inhale 1-2 puffs into the lungs every 6 (six) hours as needed for wheezing or shortness of breath. 1 each 3   Ascorbic Acid (VITAMIN C) 1000 MG tablet Take 1,000 mg by mouth daily.     budesonide-formoterol (SYMBICORT) 160-4.5 MCG/ACT inhaler TAKE 2 PUFFS BY MOUTH TWICE A DAY 30.6 each 5   cholecalciferol (VITAMIN D) 25 MCG (1000 UNIT) tablet Take 3,000 Units by mouth daily.     cyanocobalamin 2000 MCG tablet Take 1,000 mcg by mouth daily.     ipratropium-albuterol (DUONEB) 0.5-2.5 (3) MG/3ML SOLN Take 3  mLs by nebulization 3 (three) times daily. 360 mL 1   Multiple Vitamin (MULTIVITAMIN) tablet Take 1 tablet by mouth daily.     Nebulizer MISC 1 each by Does not apply route daily as needed. PLEASE PROVIDE NEBULIZER KIT TO INCLUDE TUBING/MASK/MOUTHPIECE 1 each 3   Omega-3 Fatty Acids (FISH OIL CONCENTRATE PO) Take 1 capsule by mouth daily.     OXYGEN Inhale 2 L into the lungs continuous.     predniSONE (DELTASONE) 20 MG tablet Take 3 PO QAM x5days, 2 PO QAM x5days, 1 PO QAM x5days 30 tablet 0   pyridoxine (B-6) 100 MG tablet Take 100 mg by mouth daily.     zinc sulfate 220 (50 Zn) MG capsule Take 220 mg by mouth daily.     No current facility-administered medications on file prior to visit.   Allergies  Allergen Reactions   Amlodipine Swelling    Swelling in feet   Aspirin Other (See Comments)    Upset stomach.   Other Hypertension    Hazelnuts   Penicillins Hives    Has patient had a PCN reaction causing immediate rash, facial/tongue/throat swelling, SOB or lightheadedness with hypotension: Yes Has patient had a PCN reaction causing severe rash involving mucus membranes or skin necrosis: No Has patient had a PCN reaction that required hospitalization No Has patient had a PCN reaction occurring within the last 10 years: No If all of the above answers are "NO", then may proceed with Cephalosporin use.    Sulfa Antibiotics Hives   Social History   Socioeconomic History   Marital status: Divorced    Spouse name: Not on file   Number of children: Not on file   Years of education: Not on file   Highest education level: Not on file  Occupational History   Occupation: Neurosurgeon: RETIRED  Tobacco Use   Smoking status: Former    Packs/day: 1.50    Years: 54.00    Pack years: 81.00    Types: Cigarettes    Quit date:  12/01/2001    Years since quitting: 19.8   Smokeless tobacco: Never  Vaping Use   Vaping Use: Never used  Substance and Sexual Activity   Alcohol use:  No   Drug use: No   Sexual activity: Not on file  Other Topics Concern   Not on file  Social History Narrative   Not on file   Social Determinants of Health   Financial Resource Strain: Not on file  Food Insecurity: Not on file  Transportation Needs: Not on file  Physical Activity: Not on file  Stress: Not on file  Social Connections: Not on file  Intimate Partner Violence: Not on file      Review of Systems  Respiratory:  Positive for cough.   All other systems reviewed and are negative.     Objective:   Physical Exam Vitals reviewed.  Constitutional:      Appearance: Normal appearance. He is normal weight.  Cardiovascular:     Rate and Rhythm: Normal rate and regular rhythm.     Heart sounds: Normal heart sounds. No murmur heard. Pulmonary:     Effort: Pulmonary effort is normal. No respiratory distress.     Breath sounds: Decreased breath sounds present. No wheezing, rhonchi or rales.  Abdominal:     General: Bowel sounds are normal. There is no distension.     Palpations: Abdomen is soft.     Tenderness: There is no abdominal tenderness. There is no guarding or rebound.     Hernia: A hernia is present.    Neurological:     Mental Status: He is alert.       COPD exacerbation ([place])  Confusion  Disorientation At this point, I demanded that the family manage his medicine.  I want his sister administering the medicine to make sure is being given properly.  We will also maximize therapy for COPD.  I have asked the patient to discontinue Symbicort and we will replace it with Breztri 2 inhalations twice daily.  I want to place the patient back on prednisone 40 mg daily for 7 days due to persistent wheezing and congestion and purulent sputum.  I want him to do albuterol 2 puffs every 6 hours on a scheduled basis and have his sister administer the medication to him and then reassess him in 1 week to see if his breathing is improving.  Regarding his disorientation and  persistent confusion now more than 2 weeks after his hospitalization, I will schedule an MRI to see if the patient suffered a stroke due to hypoxia during his acute illness

## 2021-10-11 NOTE — Patient Instructions (Signed)
Please call [place] at [phone] TELEPHONE  in regards to your nebulizer machine.   Thank you!

## 2021-10-17 ENCOUNTER — Encounter (HOSPITAL_COMMUNITY): Payer: Self-pay

## 2021-10-17 ENCOUNTER — Other Ambulatory Visit: Payer: Self-pay

## 2021-10-17 ENCOUNTER — Encounter: Payer: Self-pay | Admitting: Family Medicine

## 2021-10-17 ENCOUNTER — Observation Stay (HOSPITAL_COMMUNITY)
Admission: EM | Admit: 2021-10-17 | Discharge: 2021-10-20 | Disposition: A | Discharge status: Home / Self Care | Payer: Medicare HMO | Attending: Internal Medicine | Admitting: Internal Medicine

## 2021-10-17 ENCOUNTER — Ambulatory Visit (INDEPENDENT_AMBULATORY_CARE_PROVIDER_SITE_OTHER): Payer: Medicare HMO | Admitting: Family Medicine

## 2021-10-17 VITALS — BP 142/82 | HR 61 | Temp 97.7°F | Ht 65.0 in | Wt 138.0 lb

## 2021-10-17 DIAGNOSIS — Z20822 Contact with and (suspected) exposure to covid-19: Secondary | ICD-10-CM | POA: Diagnosis not present

## 2021-10-17 DIAGNOSIS — I160 Hypertensive urgency: Secondary | ICD-10-CM

## 2021-10-17 DIAGNOSIS — I10 Essential (primary) hypertension: Secondary | ICD-10-CM | POA: Diagnosis not present

## 2021-10-17 DIAGNOSIS — Z87891 Personal history of nicotine dependence: Secondary | ICD-10-CM | POA: Insufficient documentation

## 2021-10-17 DIAGNOSIS — K4190 Unilateral femoral hernia, without obstruction or gangrene, not specified as recurrent: Secondary | ICD-10-CM

## 2021-10-17 DIAGNOSIS — Z79899 Other long term (current) drug therapy: Secondary | ICD-10-CM | POA: Insufficient documentation

## 2021-10-17 DIAGNOSIS — R1032 Left lower quadrant pain: Secondary | ICD-10-CM | POA: Diagnosis present

## 2021-10-17 DIAGNOSIS — J449 Chronic obstructive pulmonary disease, unspecified: Secondary | ICD-10-CM | POA: Diagnosis not present

## 2021-10-17 DIAGNOSIS — K56600 Partial intestinal obstruction, unspecified as to cause: Principal | ICD-10-CM | POA: Insufficient documentation

## 2021-10-17 DIAGNOSIS — J441 Chronic obstructive pulmonary disease with (acute) exacerbation: Secondary | ICD-10-CM

## 2021-10-17 DIAGNOSIS — J9611 Chronic respiratory failure with hypoxia: Secondary | ICD-10-CM

## 2021-10-17 DIAGNOSIS — K4000 Bilateral inguinal hernia, with obstruction, without gangrene, not specified as recurrent: Secondary | ICD-10-CM

## 2021-10-17 DIAGNOSIS — K4090 Unilateral inguinal hernia, without obstruction or gangrene, not specified as recurrent: Secondary | ICD-10-CM

## 2021-10-17 DIAGNOSIS — R410 Disorientation, unspecified: Secondary | ICD-10-CM | POA: Diagnosis not present

## 2021-10-17 DIAGNOSIS — E860 Dehydration: Secondary | ICD-10-CM

## 2021-10-17 DIAGNOSIS — K4091 Unilateral inguinal hernia, without obstruction or gangrene, recurrent: Secondary | ICD-10-CM

## 2021-10-17 DIAGNOSIS — K56609 Unspecified intestinal obstruction, unspecified as to partial versus complete obstruction: Secondary | ICD-10-CM | POA: Diagnosis not present

## 2021-10-17 DIAGNOSIS — K4020 Bilateral inguinal hernia, without obstruction or gangrene, not specified as recurrent: Secondary | ICD-10-CM | POA: Diagnosis not present

## 2021-10-17 DIAGNOSIS — R109 Unspecified abdominal pain: Secondary | ICD-10-CM

## 2021-10-17 LAB — URINALYSIS, ROUTINE W REFLEX MICROSCOPIC
Bilirubin Urine: NEGATIVE
Glucose, UA: NEGATIVE mg/dL
Hgb urine dipstick: NEGATIVE
Ketones, ur: NEGATIVE mg/dL
Leukocytes,Ua: NEGATIVE
Nitrite: NEGATIVE
Protein, ur: NEGATIVE mg/dL
Specific Gravity, Urine: 1.011 (ref 1.005–1.030)
pH: 6 (ref 5.0–8.0)

## 2021-10-17 MED ORDER — FENTANYL CITRATE PF 50 MCG/ML IJ SOSY
50.0000 ug | PREFILLED_SYRINGE | Freq: Once | INTRAMUSCULAR | Status: AC
  Administered 2021-10-17: 50 ug via INTRAVENOUS
  Filled 2021-10-17: qty 1

## 2021-10-17 NOTE — ED Triage Notes (Signed)
Pt presents to Ed with groin pain from inguinal hernia. Pt reports seeing PCP earlier and he wanted to give him pain meds, but pt didn't want pain meds earlier, but says he does need them now.

## 2021-10-17 NOTE — Progress Notes (Signed)
Subjective:    Patient ID: Michael Evans, male    DOB: 03-16-1941, 80 y.o.   MRN: [id number]  Admit date: 09/24/2021 Discharge date: 09/29/2021   Admitted From:  Home  Disposition: Home    Recommendations for Outpatient Follow-up:  Follow up with PCP in 1 weeks Follow up with [facility]      Discharge Condition: STABLE   CODE STATUS: FULL DIET: Heart Healthy     Brief Hospitalization Summary: Please see all hospital notes, images, labs for full details of the hospitalization. ADMISSION HPI: Michael Evans is a 80 y.o. male with medical history significant of COPD, chronic respiratory failure on 2 L, hypertension, arthritis presented with acute on chronic respiratory failure with hypoxia and COPD exacerbation.  Patient noted to have been seen in the ER back on October 18 for similar symptoms.  Was diagnosed with a COPD exacerbation.  Per report, patient was placed on a Z-Pak.  Symptoms have minimally improved over the course of this treatment.  Unclear if patient got steroids with evaluation.  Per the patient, he has had increased cough, wheezing, mild sputum production.  No chest pain.  No nausea or vomiting.  Patient does attribute the flare to likely weather changes.  Patient states that symptoms got worse once the vents were closed in his house.  No fevers or chills.  No reported sick contacts.  Patient denies smoking.  Per the sister, [name], patient quit smoking years ago.   ED Course: Presented to the ER afebrile, hemodynamically stable.  Requiring 2 to 4 L nasal cannula for oxygenation.  COVID-negative.  Labs notable for K of 5.8.  Resolved after albuterol treatment and Lokelma with a potassium of 3.4.  Troponin and EKG within normal limits.  Chest x-ray within normal limits.    HOSPITAL COURSE by problem list   Acute on chronic respiratory failure with hypoxia secondary to COPD exacerbation -Patient was treated with bronchodilators, IV steroids, antibiotics and now  feeling back to his baseline.  DC home with outpatient follow up.  He will discharge home on prednisone taper.  Ambulatory referral to [facility].  He has not seen them in several years.     Severe COPD - Patient has diffuse if for emphysematous changes on x-ray - see above recommendations for acute exacerbation  -Sputum culture reviewed normal respiratory flora no staph or Pseudomonas noted He will discharge home on prednisone taper and ambulatory referral to [facility] pulmonary clinic.     Essential Hypertension  -His blood pressure is managed with diet only, we are monitoring and it has been stable and controlled   OSA -We offered nightly CPAP in hospital       DVT prophylaxis: enoxaparin  Code Status: Full  Family Communication: sister by telephone  Disposition: anticipating DC home  Status is: Inpatient   Discharge Diagnoses:  Active Problems:   Essential hypertension, benign   OSA (obstructive sleep apnea)   COPD exacerbation (HCC)   Acute on chronic respiratory failure with hypoxia (HCC)   Hyperglycemia   Acute on chronic respiratory failure (HCC)   COPD (chronic obstructive pulmonary disease) [facility])     Discharge Instructions: Discharge Instructions       Ambulatory referral to Pulmonology   Complete by: As directed      Hospital Follow Up    Reason for referral: Asthma/COPD     10/04/21 Patient was recently admitted to the hospital with COPD exacerbation.  He is here today with his  family for follow-up.  The patient seems very confused compared to his baseline.  He has a difficult time answering any of my questions.  He is awake and alert.  He is not delirious.  However he is unable to answer the majority of my questions.  For instance, he is unable to tell me how he is taking his prednisone or how often he is taking his prednisone.  He is unable to tell me how often he is using his rescue nebulizer treatment.  He is unable to tell me if he still has antibiotics  left.  This is certainly not near the patient's baseline.  He is now staying with his sister due to his confusion to convalesce after returning home from the hospital.  He states that his breathing is improving.  Today on exam, his lungs sound very good for his baseline.  He has diminished breath sounds throughout but there are no wheezes crackles or rails.  He is not struggling to breathe and his pulse oximetry is 100% on 2 L.  Again however, he is not at his baseline mental status.  He is able to tell me the year.  He is able to tell me the day of the week.  However he is unable to tell me what month we are in and he is unable to tell me my name.  He also is here complaining of pain in his left anal canal.  He has a large left inguinal hernia that is easily reproducible.  He states that this started hurting him about a week before going to the hospital.  There is no evidence of incarceration today.  At that time, my plan was:  I am most concerned by his confusion.  I believe his delirium from his recent hospitalization.  I have asked him to increase his duo nebs to every 6 hours and to continue his Symbicort.  I would like to see him back in 1 week having completed all of his antibiotics and all of his steroids.  At that time hopefully, the patient will be approaching his baseline mental status.  If not, we may want to pursue imaging at that time to see if he has suffered a mild stroke.  However there is no obvious neurologic deficit on exam.  He does have a large left inguinal hernia that is easily reproducible today with no evidence of obstruction.  I recommended a hernia belt.  Patient will be a poor surgical candidate at the current moment due to his delirium and his age and his COPD.  Therefore I have not encouraged surgical correction at this time.  I will check a CMP and a CBC today to follow-up with hyperkalemia from the hospital.  I believe that that was due primarily to dehydration and his illness.   Reassess in 1 week or sooner if worsening   10/11/21 Patient is here today with his sister.  He remains extremely confused.  I asked him to explain how he is taking his inhalers and breathing medicine and he states 2-3 times a day.  He is unable to differentiate the difference between Symbicort and DuoNeb and his albuterol inhaler.  He has completed his antibiotics and steroids however continues to have a cough productive of thick yellow mucus.  The cough keeps him awake at night.  He continues to suffer from confusion and disorientation however he is still managing his medicines which concerns me.   At this point, I demanded that the family  manage his medicine.  I want his sister administering the medicine to make sure is being given properly.  We will also maximize therapy for COPD.  I have asked the patient to discontinue Symbicort and we will replace it with Breztri 2 inhalations twice daily.  I want to place the patient back on prednisone 40 mg daily for 7 days due to persistent wheezing and congestion and purulent sputum.  I want him to do albuterol 2 puffs every 6 hours on a scheduled basis and have his sister administer the medication to him and then reassess him in 1 week to see if his breathing is improving.  Regarding his disorientation and persistent confusion now more than 2 weeks after his hospitalization, I will schedule an MRI to see if the patient suffered a stroke due to hypoxia during his acute illness  10/17/21 Patient states that he is doing better today.  Both he and his sister feels that his mentation is improving.  He is more clear today and better able to answer my questions.  He also believes that his breathing is improving.  He states that if he could "just cough that messed up" he would be fine.  He denies any chest pain or pleurisy.  His shortness of breath is back to his baseline.  Today on pulmonary exam, he has diminished breath sounds all throughout and rhonchorous breath  sounds in the left midlung and left base but otherwise his lung volumes have improved and his wheezing has improved.  However today he is reporting more pain over his inguinal hernia.  Today on examination, his inguinal hernia in the left inguinal canal is easily reducible.  There is no evidence of incarceration.  He reports tenderness and pain with coughing and straining.  However there is no evidence of an incarcerated hernia.  He denies any nausea or vomiting.  He denies any melena or hematochezia.  He still moving his bowels.  He has an appointment to follow-up with his general surgeon on December 6. Past Medical History:  Diagnosis Date   Arthritis    Chronic rhinitis    COPD (chronic obstructive pulmonary disease) ([place])    PFT 06-26-09 FEV1  1.75( 71%), FVC 3.65( 101%), FEV1% 48, TLC 5.53(104%), DLCO 55%, no BD   Hypertension    Nasal septal deviation    Nocturia    On home O2    2L N/C   OSA (obstructive sleep apnea) 04/21/2011   Skin cancer    Past Surgical History:  Procedure Laterality Date   APPENDECTOMY     BACK SURGERY     CATARACT EXTRACTION W/PHACO  01/06/2013   Procedure: CATARACT EXTRACTION PHACO AND INTRAOCULAR LENS PLACEMENT ([place]);  Surgeon: Tonny Branch, MD;  Location: [facility];  Service: Ophthalmology;  Laterality: Right;  CDE: 22.17   Current Outpatient Medications on File Prior to Visit  Medication Sig Dispense Refill   albuterol (VENTOLIN HFA) 108 (90 Base) MCG/ACT inhaler Inhale 1-2 puffs into the lungs every 6 (six) hours as needed for wheezing or shortness of breath. 1 each 3   Ascorbic Acid (VITAMIN C) 1000 MG tablet Take 1,000 mg by mouth daily.     Budeson-Glycopyrrol-Formoterol (BREZTRI AEROSPHERE) 160-9-4.8 MCG/ACT AERO Inhale 2 puffs into the lungs in the morning and at bedtime.     cholecalciferol (VITAMIN D) 25 MCG (1000 UNIT) tablet Take 3,000 Units by mouth daily.     cyanocobalamin 2000 MCG tablet Take 1,000 mcg by mouth daily.  Multiple Vitamin  (MULTIVITAMIN) tablet Take 1 tablet by mouth daily.     Nebulizer MISC 1 each by Does not apply route daily as needed. PLEASE PROVIDE NEBULIZER KIT TO INCLUDE TUBING/MASK/MOUTHPIECE 1 each 3   Omega-3 Fatty Acids (FISH OIL CONCENTRATE PO) Take 1 capsule by mouth daily.     OXYGEN Inhale 2 L into the lungs continuous.     predniSONE (DELTASONE) 20 MG tablet Take 2 tablets (40 mg total) by mouth daily with breakfast. 14 tablet 0   pyridoxine (B-6) 100 MG tablet Take 100 mg by mouth daily.     zinc sulfate 220 (50 Zn) MG capsule Take 220 mg by mouth daily.     No current facility-administered medications on file prior to visit.   Allergies  Allergen Reactions   Amlodipine Swelling    Swelling in feet   Aspirin Other (See Comments)    Upset stomach.   Other Hypertension    Hazelnuts   Penicillins Hives    Has patient had a PCN reaction causing immediate rash, facial/tongue/throat swelling, SOB or lightheadedness with hypotension: Yes Has patient had a PCN reaction causing severe rash involving mucus membranes or skin necrosis: No Has patient had a PCN reaction that required hospitalization No Has patient had a PCN reaction occurring within the last 10 years: No If all of the above answers are "NO", then may proceed with Cephalosporin use.    Sulfa Antibiotics Hives   Social History   Socioeconomic History   Marital status: Divorced    Spouse name: Not on file   Number of children: Not on file   Years of education: Not on file   Highest education level: Not on file  Occupational History   Occupation: Neurosurgeon: RETIRED  Tobacco Use   Smoking status: Former    Packs/day: 1.50    Years: 54.00    Pack years: 81.00    Types: Cigarettes    Quit date: 12/01/2001    Years since quitting: 19.8   Smokeless tobacco: Never  Vaping Use   Vaping Use: Never used  Substance and Sexual Activity   Alcohol use: No   Drug use: No   Sexual activity: Not on file  Other Topics  Concern   Not on file  Social History Narrative   Not on file   Social Determinants of Health   Financial Resource Strain: Not on file  Food Insecurity: Not on file  Transportation Needs: Not on file  Physical Activity: Not on file  Stress: Not on file  Social Connections: Not on file  Intimate Partner Violence: Not on file      Review of Systems  Respiratory:  Positive for cough.   All other systems reviewed and are negative.     Objective:   Physical Exam Vitals reviewed.  Constitutional:      Appearance: Normal appearance. He is normal weight.  Cardiovascular:     Rate and Rhythm: Normal rate and regular rhythm.     Heart sounds: Normal heart sounds. No murmur heard. Pulmonary:     Effort: Pulmonary effort is normal. No respiratory distress.     Breath sounds: Decreased breath sounds present. No wheezing, rhonchi or rales.  Abdominal:     General: Bowel sounds are normal. There is no distension.     Palpations: Abdomen is soft.     Tenderness: There is no abdominal tenderness. There is no guarding or rebound.     Hernia: A  hernia is present.    Neurological:     Mental Status: He is alert.    COPD exacerbation ([place])  Confusion  Left inguinal hernia Confusion slowly seems to be doing better.  Also his pulmonary status seems to be approaching his baseline after a prolonged course of steroids and having his family administered the [place].  We will continue the patient on Breztri 2 puffs inhaled twice daily but he can wean back on the albuterol rescue medication to every 6 hours as needed.  Complete tomorrow his last dose of prednisone.  Follow-up as planned with the surgeon.  The pain in his left inguinal canal today seems to be just tenderness.  There is no evidence of an inguinal hernia that is incarcerated.  Therefore I do not feel that he needs urgent surgical correction.  We discussed warning signs of an incarcerated inguinal hernia and when to go to the  emergency room

## 2021-10-18 ENCOUNTER — Emergency Department (HOSPITAL_COMMUNITY): Payer: Medicare HMO

## 2021-10-18 ENCOUNTER — Observation Stay (HOSPITAL_COMMUNITY): Payer: Medicare HMO

## 2021-10-18 DIAGNOSIS — K56600 Partial intestinal obstruction, unspecified as to cause: Secondary | ICD-10-CM | POA: Diagnosis not present

## 2021-10-18 DIAGNOSIS — Z4682 Encounter for fitting and adjustment of non-vascular catheter: Secondary | ICD-10-CM | POA: Diagnosis not present

## 2021-10-18 DIAGNOSIS — R109 Unspecified abdominal pain: Secondary | ICD-10-CM

## 2021-10-18 DIAGNOSIS — K4090 Unilateral inguinal hernia, without obstruction or gangrene, not specified as recurrent: Secondary | ICD-10-CM

## 2021-10-18 DIAGNOSIS — R1031 Right lower quadrant pain: Secondary | ICD-10-CM | POA: Diagnosis not present

## 2021-10-18 DIAGNOSIS — J9611 Chronic respiratory failure with hypoxia: Secondary | ICD-10-CM

## 2021-10-18 DIAGNOSIS — I10 Essential (primary) hypertension: Secondary | ICD-10-CM | POA: Diagnosis not present

## 2021-10-18 DIAGNOSIS — E860 Dehydration: Secondary | ICD-10-CM | POA: Diagnosis not present

## 2021-10-18 DIAGNOSIS — K4000 Bilateral inguinal hernia, with obstruction, without gangrene, not specified as recurrent: Secondary | ICD-10-CM | POA: Diagnosis not present

## 2021-10-18 DIAGNOSIS — J449 Chronic obstructive pulmonary disease, unspecified: Secondary | ICD-10-CM | POA: Diagnosis not present

## 2021-10-18 DIAGNOSIS — K56609 Unspecified intestinal obstruction, unspecified as to partial versus complete obstruction: Secondary | ICD-10-CM | POA: Diagnosis not present

## 2021-10-18 DIAGNOSIS — I160 Hypertensive urgency: Secondary | ICD-10-CM

## 2021-10-18 HISTORY — DX: Partial intestinal obstruction, unspecified as to cause: K56.600

## 2021-10-18 LAB — CBC WITH DIFFERENTIAL/PLATELET
Abs Immature Granulocytes: 0.25 10*3/uL — ABNORMAL HIGH (ref 0.00–0.07)
Basophils Absolute: 0.1 10*3/uL (ref 0.0–0.1)
Basophils Relative: 0 %
Eosinophils Absolute: 0 10*3/uL (ref 0.0–0.5)
Eosinophils Relative: 0 %
HCT: 51.6 % (ref 39.0–52.0)
Hemoglobin: 17.1 g/dL — ABNORMAL HIGH (ref 13.0–17.0)
Immature Granulocytes: 2 %
Lymphocytes Relative: 8 %
Lymphs Abs: 1.4 10*3/uL (ref 0.7–4.0)
MCH: 30.6 pg (ref 26.0–34.0)
MCHC: 33.1 g/dL (ref 30.0–36.0)
MCV: 92.5 fL (ref 80.0–100.0)
Monocytes Absolute: 1.2 10*3/uL — ABNORMAL HIGH (ref 0.1–1.0)
Monocytes Relative: 7 %
Neutro Abs: 13.7 10*3/uL — ABNORMAL HIGH (ref 1.7–7.7)
Neutrophils Relative %: 83 %
Platelets: 223 10*3/uL (ref 150–400)
RBC: 5.58 MIL/uL (ref 4.22–5.81)
RDW: 13.2 % (ref 11.5–15.5)
WBC: 16.6 10*3/uL — ABNORMAL HIGH (ref 4.0–10.5)
nRBC: 0 % (ref 0.0–0.2)

## 2021-10-18 LAB — COMPREHENSIVE METABOLIC PANEL
ALT: 34 U/L (ref 0–44)
AST: 22 U/L (ref 15–41)
Albumin: 3.8 g/dL (ref 3.5–5.0)
Alkaline Phosphatase: 58 U/L (ref 38–126)
Anion gap: 11 (ref 5–15)
BUN: 25 mg/dL — ABNORMAL HIGH (ref 8–23)
CO2: 30 mmol/L (ref 22–32)
Calcium: 10.6 mg/dL — ABNORMAL HIGH (ref 8.9–10.3)
Chloride: 94 mmol/L — ABNORMAL LOW (ref 98–111)
Creatinine, Ser: 0.91 mg/dL (ref 0.61–1.24)
GFR, Estimated: >60 mL/min (ref >60)
Glucose, Bld: 160 mg/dL — ABNORMAL HIGH (ref 70–99)
Potassium: 4.4 mmol/L (ref 3.5–5.1)
Sodium: 135 mmol/L (ref 135–145)
Total Bilirubin: 0.9 mg/dL (ref 0.3–1.2)
Total Protein: 7.1 g/dL (ref 6.5–8.1)

## 2021-10-18 LAB — MAGNESIUM: Magnesium: 2.3 mg/dL (ref 1.7–2.4)

## 2021-10-18 LAB — RESP PANEL BY RT-PCR (FLU A&B, COVID) ARPGX2
Influenza A by PCR: NEGATIVE
Influenza B by PCR: NEGATIVE
SARS Coronavirus 2 by RT PCR: NEGATIVE

## 2021-10-18 LAB — LIPASE, BLOOD: Lipase: 22 U/L (ref 11–51)

## 2021-10-18 LAB — PHOSPHORUS: Phosphorus: 3.5 mg/dL (ref 2.5–4.6)

## 2021-10-18 MED ORDER — MORPHINE SULFATE (PF) 2 MG/ML IV SOLN
2.0000 mg | INTRAVENOUS | Status: DC | PRN
  Administered 2021-10-18 (×2): 2 mg via INTRAVENOUS
  Filled 2021-10-18 (×2): qty 1

## 2021-10-18 MED ORDER — ONDANSETRON HCL 4 MG/2ML IJ SOLN: 4.0000 mg | Freq: Four times a day (QID) | INTRAMUSCULAR | Status: DC | PRN

## 2021-10-18 MED ORDER — BUDESON-GLYCOPYRROL-FORMOTEROL 160-9-4.8 MCG/ACT IN AERO: 2.0000 | INHALATION_SPRAY | Freq: Two times a day (BID) | RESPIRATORY_TRACT | Status: DC

## 2021-10-18 MED ORDER — MOMETASONE FURO-FORMOTEROL FUM 100-5 MCG/ACT IN AERO
2.0000 | INHALATION_SPRAY | Freq: Two times a day (BID) | RESPIRATORY_TRACT | Status: DC
  Administered 2021-10-18 – 2021-10-20 (×5): 2 via RESPIRATORY_TRACT
  Filled 2021-10-18: qty 8.8

## 2021-10-18 MED ORDER — PREDNISONE 20 MG PO TABS: 40.0000 mg | ORAL_TABLET | Freq: Every day | ORAL | Status: DC

## 2021-10-18 MED ORDER — ONDANSETRON HCL 4 MG/2ML IJ SOLN
4.0000 mg | Freq: Once | INTRAMUSCULAR | Status: AC
  Administered 2021-10-18: 4 mg via INTRAVENOUS
  Filled 2021-10-18: qty 2

## 2021-10-18 MED ORDER — HYDRALAZINE HCL 20 MG/ML IJ SOLN
10.0000 mg | Freq: Four times a day (QID) | INTRAMUSCULAR | Status: DC | PRN
  Administered 2021-10-18: 10 mg via INTRAVENOUS
  Filled 2021-10-18: qty 1

## 2021-10-18 MED ORDER — IOHEXOL 300 MG/ML  SOLN
100.0000 mL | Freq: Once | INTRAMUSCULAR | Status: AC | PRN
  Administered 2021-10-18: 100 mL via INTRAVENOUS

## 2021-10-18 MED ORDER — OMEGA-3-ACID ETHYL ESTERS 1 G PO CAPS
1.0000 g | ORAL_CAPSULE | Freq: Every day | ORAL | Status: DC
  Administered 2021-10-18 – 2021-10-20 (×2): 1 g via ORAL
  Filled 2021-10-18 (×3): qty 1

## 2021-10-18 MED ORDER — IPRATROPIUM-ALBUTEROL 0.5-2.5 (3) MG/3ML IN SOLN
3.0000 mL | RESPIRATORY_TRACT | Status: DC | PRN
  Administered 2021-10-18 – 2021-10-20 (×3): 3 mL via RESPIRATORY_TRACT
  Filled 2021-10-18 (×3): qty 3

## 2021-10-18 MED ORDER — METHYLPREDNISOLONE SODIUM SUCC 40 MG IJ SOLR: 40.0000 mg | Freq: Every day | INTRAMUSCULAR | Status: DC

## 2021-10-18 MED ORDER — SODIUM CHLORIDE 0.9 % IV SOLN
INTRAVENOUS | Status: DC
Start: 2021-10-18 — End: 2021-10-19
  Administered 2021-10-18: 75 mL/h via INTRAVENOUS

## 2021-10-18 MED ORDER — UMECLIDINIUM BROMIDE 62.5 MCG/ACT IN AEPB
1.0000 | INHALATION_SPRAY | Freq: Every day | RESPIRATORY_TRACT | Status: DC
  Administered 2021-10-19 – 2021-10-20 (×2): 1 via RESPIRATORY_TRACT
  Filled 2021-10-18: qty 7

## 2021-10-18 MED ORDER — FENTANYL CITRATE PF 50 MCG/ML IJ SOSY
100.0000 ug | PREFILLED_SYRINGE | Freq: Once | INTRAMUSCULAR | Status: AC
  Filled 2021-10-18: qty 2

## 2021-10-18 MED ORDER — FENTANYL CITRATE PF 50 MCG/ML IJ SOSY
PREFILLED_SYRINGE | INTRAMUSCULAR | Status: AC
  Administered 2021-10-18: 100 ug via INTRAVENOUS
  Filled 2021-10-18: qty 1

## 2021-10-18 MED ORDER — SODIUM CHLORIDE 0.9 % IV SOLN: Freq: Once | INTRAVENOUS | Status: AC

## 2021-10-18 NOTE — Consult Note (Addendum)
[facility] Consult  Reason for Consult: Bilateral inguinal hernias, SBO  Referring Physician: ED  Chief Complaint   Groin Pain     HPI: Michael Evans is a 80 y.o. male with bilateral inguinal hernias that I met a few weeks ago. He has COPD on O2 at home and has been on an active steroid taper. He is using his belt but had worsening pain in the groin. He says that the pain was actually worse on the middle portion of his pubic area and not specifically on the left or right. He denied any nausea or vomiting and has had a BM today.  I was called last night and Dr. Christy Gentles was going to try to get the hernia reduced to help with the SBO. He was also going to get an NG placed. Unfortunately the Ng was not successful, but his hernia was reduced when Dr. Christy Gentles went back to try to reduce it.    Past Medical History:  Diagnosis Date   Arthritis    Chronic rhinitis    COPD (chronic obstructive pulmonary disease) ([place])    PFT 06-26-09 FEV1  1.75( 71%), FVC 3.65( 101%), FEV1% 48, TLC 5.53(104%), DLCO 55%, no BD   Hypertension    Nasal septal deviation    Nocturia    On home O2    2L N/C   OSA (obstructive sleep apnea) 04/21/2011   Skin cancer     Past Surgical History:  Procedure Laterality Date   APPENDECTOMY     BACK SURGERY     CATARACT EXTRACTION W/PHACO  01/06/2013   Procedure: CATARACT EXTRACTION PHACO AND INTRAOCULAR LENS PLACEMENT ([place]);  Surgeon: Tonny Branch, MD;  Location: [facility];  Service: Ophthalmology;  Laterality: Right;  CDE: 22.17    Family History  Problem Relation Age of Onset   Lung cancer Mother     Social History   Tobacco Use   Smoking status: Former    Packs/day: 1.50    Years: 54.00    Pack years: 81.00    Types: Cigarettes    Quit date: 12/01/2001    Years since quitting: 19.8   Smokeless tobacco: Never  Vaping Use   Vaping Use: Never used  Substance Use Topics   Alcohol use: No   Drug use: No    Medications: I have reviewed the  patient's current medications. Prior to Admission: (Not in a hospital admission)  Scheduled:  mometasone-formoterol  2 puff Inhalation BID   omega-3 acid ethyl esters  1 g Oral Daily   umeclidinium bromide  1 puff Inhalation Daily   Continuous:  sodium chloride 75 mL/hr at 10/18/21 0819   CWC:[id number], ipratropium-albuterol, morphine injection, ondansetron (ZOFRAN) IV  Allergies  Allergen Reactions   Amlodipine Swelling    Swelling in feet   Aspirin Other (See Comments)    Upset stomach.   Other Hypertension    Hazelnuts   Penicillins Hives    Has patient had a PCN reaction causing immediate rash, facial/tongue/throat swelling, SOB or lightheadedness with hypotension: Yes Has patient had a PCN reaction causing severe rash involving mucus membranes or skin necrosis: No Has patient had a PCN reaction that required hospitalization No Has patient had a PCN reaction occurring within the last 10 years: No If all of the above answers are "NO", then may proceed with Cephalosporin use.    Sulfa Antibiotics Hives     ROS:  A comprehensive review of systems was negative except for: Respiratory: positive for chronic  bronchitis, cough, and dyspnea on exertion Gastrointestinal: positive for abdominal pain  Blood pressure 132/66, pulse 87, temperature 97.7 F (36.5 C), temperature source Oral, resp. rate 15, height _0  (1.651 m), weight 62.1 kg, SpO2 98 %. Physical Exam Vitals reviewed.  Constitutional:      Appearance: Normal appearance.  HENT:     Head: Normocephalic.  Eyes:     Extraocular Movements: Extraocular movements intact.  Cardiovascular:     Rate and Rhythm: Normal rate.  Pulmonary:     Effort: Pulmonary effort is normal.     Breath sounds: Rhonchi present.  Abdominal:     General: There is no distension.     Palpations: Abdomen is soft.     Tenderness: There is no abdominal tenderness.     Hernia: A hernia is present. Hernia is present in the left inguinal  area and right inguinal area.     Comments: Reducible and minimally tender, hernia belt in the middle and not on the sides  Musculoskeletal:        General: No swelling.     Cervical back: Normal range of motion.  Skin:    General: Skin is warm.  Neurological:     General: No focal deficit present.     Mental Status: He is alert and oriented to person, place, and time.  Psychiatric:        Mood and Affect: Mood normal.        Behavior: Behavior normal.        Thought Content: Thought content normal.        Judgment: Judgment normal.    Results: Results for orders placed or performed during the hospital encounter of 10/17/21 (from the past 48 hour(s))  Urinalysis, Routine w reflex microscopic Urine, Clean Catch     Status: None   Collection Time: 10/17/21 11:20 PM  Result Value Ref Range   Color, Urine YELLOW YELLOW   APPearance CLEAR CLEAR   Specific Gravity, Urine 1.011 1.005 - 1.030   pH 6.0 5.0 - 8.0   Glucose, UA NEGATIVE NEGATIVE mg/dL   Hgb urine dipstick NEGATIVE NEGATIVE   Bilirubin Urine NEGATIVE NEGATIVE   Ketones, ur NEGATIVE NEGATIVE mg/dL   Protein, ur NEGATIVE NEGATIVE mg/dL   Nitrite NEGATIVE NEGATIVE   Leukocytes,Ua NEGATIVE NEGATIVE    Comment: Performed at [facility], [address] [place], [place] [address]  CBC with Differential/Platelet     Status: Abnormal   Collection Time: 10/17/21 11:47 PM  Result Value Ref Range   WBC 16.6 (H) 4.0 - 10.5 K/uL   RBC 5.58 4.22 - 5.81 MIL/uL   Hemoglobin 17.1 (H) 13.0 - 17.0 g/dL   HCT 51.6 39.0 - 52.0 %   MCV 92.5 80.0 - 100.0 fL   MCH 30.6 26.0 - 34.0 pg   MCHC 33.1 30.0 - 36.0 g/dL   RDW 13.2 11.5 - 15.5 %   Platelets 223 150 - 400 K/uL   nRBC 0.0 0.0 - 0.2 %   Neutrophils Relative % 83 %   Neutro Abs 13.7 (H) 1.7 - 7.7 K/uL   Lymphocytes Relative 8 %   Lymphs Abs 1.4 0.7 - 4.0 K/uL   Monocytes Relative 7 %   Monocytes Absolute 1.2 (H) 0.1 - 1.0 K/uL   Eosinophils Relative 0 %   Eosinophils Absolute  0.0 0.0 - 0.5 K/uL   Basophils Relative 0 %   Basophils Absolute 0.1 0.0 - 0.1 K/uL   Immature Granulocytes 2 %  Abs Immature Granulocytes 0.25 (H) 0.00 - 0.07 K/uL    Comment: Performed at [facility], [address], [place], [place] [address]  Comprehensive metabolic panel     Status: Abnormal   Collection Time: 10/17/21 11:47 PM  Result Value Ref Range   Sodium 135 135 - 145 mmol/L   Potassium 4.4 3.5 - 5.1 mmol/L   Chloride 94 (L) 98 - 111 mmol/L   CO2 30 22 - 32 mmol/L   Glucose, Bld 160 (H) 70 - 99 mg/dL    Comment: Glucose reference range applies only to samples taken after fasting for at least 8 hours.   BUN 25 (H) 8 - 23 mg/dL   Creatinine, Ser 0.91 0.61 - 1.24 mg/dL   Calcium 10.6 (H) 8.9 - 10.3 mg/dL   Total Protein 7.1 6.5 - 8.1 g/dL   Albumin 3.8 3.5 - 5.0 g/dL   AST 22 15 - 41 U/L   ALT 34 0 - 44 U/L   Alkaline Phosphatase 58 38 - 126 U/L   Total Bilirubin 0.9 0.3 - 1.2 mg/dL   GFR, Estimated >60 >60 mL/min    Comment: (NOTE) Calculated using the CKD-EPI Creatinine Equation (2021)    Anion gap 11 5 - 15    Comment: Performed at [facility], [address], [place], [place] [address]  Lipase, blood     Status: None   Collection Time: 10/17/21 11:47 PM  Result Value Ref Range   Lipase 22 11 - 51 U/L    Comment: Performed at [facility], [address] [place], [place] [address]  Magnesium     Status: None   Collection Time: 10/18/21 12:00 AM  Result Value Ref Range   Magnesium 2.3 1.7 - 2.4 mg/dL    Comment: Performed at [facility], [address] [place], [place] [address]  Phosphorus     Status: None   Collection Time: 10/18/21 12:00 AM  Result Value Ref Range   Phosphorus 3.5 2.5 - 4.6 mg/dL    Comment: Performed at [facility], [address] [place], [place] [address]  Resp Panel by RT-PCR (Flu A&B, Covid) Nasopharyngeal Swab     Status: None   Collection Time: 10/18/21  2:13 AM   Specimen: Nasopharyngeal Swab; Nasopharyngeal(NP) swabs in vial  transport medium  Result Value Ref Range   SARS Coronavirus 2 by RT PCR NEGATIVE NEGATIVE    Comment: (NOTE) SARS-CoV-2 target nucleic acids are NOT DETECTED.  The SARS-CoV-2 RNA is generally detectable in upper respiratory specimens during the acute phase of infection. The lowest concentration of SARS-CoV-2 viral copies this assay can detect is 138 copies/mL. A negative result does not preclude SARS-Cov-2 infection and should not be used as the sole basis for treatment or other patient management decisions. A negative result may occur with  improper specimen collection/handling, submission of specimen other than nasopharyngeal swab, presence of viral mutation(s) within the areas targeted by this assay, and inadequate number of viral copies(<138 copies/mL). A negative result must be combined with clinical observations, patient history, and epidemiological information. The expected result is Negative.  Fact Sheet for Patients:  EntrepreneurPulse.com.au  Fact Sheet for Healthcare Providers:  IncredibleEmployment.be  This test is no t yet approved or cleared by the [place] FDA and  has been authorized for detection and/or diagnosis of SARS-CoV-2 by FDA under an Emergency Use Authorization (EUA). This EUA will remain  in effect (meaning this test can be used) for the duration of the COVID-19 declaration  under Section 564(b)(1) of the Act, 21 U.S.C.section 360bbb-3(b)(1), unless the authorization is terminated  or revoked sooner.       Influenza A by PCR NEGATIVE NEGATIVE   Influenza B by PCR NEGATIVE NEGATIVE    Comment: (NOTE) The Xpert Xpress SARS-CoV-2/FLU/RSV plus assay is intended as an aid in the diagnosis of influenza from Nasopharyngeal swab specimens and should not be used as a sole basis for treatment. Nasal washings and aspirates are unacceptable for Xpert Xpress SARS-CoV-2/FLU/RSV testing.  Fact Sheet for  Patients: EntrepreneurPulse.com.au  Fact Sheet for Healthcare Providers: IncredibleEmployment.be  This test is not yet approved or cleared by the [place] FDA and has been authorized for detection and/or diagnosis of SARS-CoV-2 by FDA under an Emergency Use Authorization (EUA). This EUA will remain in effect (meaning this test can be used) for the duration of the COVID-19 declaration under Section 564(b)(1) of the Act, 21 U.S.C. section 360bbb-3(b)(1), unless the authorization is terminated or revoked.  Performed at [facility], [address] [place], [place] [address]    Personally reviewed- right inguinal hernia with small bowel and left inguinal hernia with colon, no wall thickening or fluid, no signs of ischemia  CT ABDOMEN PELVIS W CONTRAST  Result Date: 10/18/2021 CLINICAL DATA:  Acute abdominal pain, groin pain EXAM: CT ABDOMEN AND PELVIS WITH CONTRAST TECHNIQUE: Multidetector CT imaging of the abdomen and pelvis was performed using the standard protocol following bolus administration of intravenous contrast. CONTRAST:  171m OMNIPAQUE IOHEXOL 300 MG/ML  SOLN COMPARISON:  None FINDINGS: Lower chest: Advanced emphysematous changes within the visualized lung bases. Superimposed mild bibasilar parenchymal scarring. Mild coronary artery calcification. Global cardiac size within normal limits. Hepatobiliary: No focal liver abnormality is seen. No gallstones, gallbladder wall thickening, or biliary dilatation. Pancreas: Unremarkable Spleen: Unremarkable Adrenals/Urinary Tract: The adrenal glands are unremarkable. The kidneys are normal in size and position. Simple cortical cyst noted bilaterally. The kidneys are otherwise unremarkable. Bladder unremarkable. Stomach/Bowel: Bilateral small inguinal hernias are present. On the left, the hernia sac contains a loop of unremarkable sigmoid colon. On the right, the hernia sac contains a single loop of distal  small bowel and results in a distal small-bowel obstruction with the point of transition seen at the internal ring. The proximal small bowel is mildly dilated and fluid-filled. The distal small bowel and colon is decompressed. No free intraperitoneal gas or fluid. The appendix is absent. Vascular/Lymphatic: Extensive mixed aortoiliac atherosclerotic plaque. No aortic aneurysm. Moderate plaque at the proximal superior mesenteric artery results in a roughly 50% stenosis of this vessel focally. No pathologic adenopathy within the abdomen and pelvis. Reproductive: Prostate is unremarkable. Other: Rectum unremarkable. Musculoskeletal: Degenerative changes are seen within the lumbar spine. No acute bone abnormality. IMPRESSION: Right inguinal hernia containing a single loop of distal small bowel with resultant distal small bowel obstruction with point of transition at the internal ring. Small left inguinal hernia containing and unremarkable loop of sigmoid colon. Severe emphysema. Mild coronary artery calcification. Peripheral vascular disease with suspected 50% stenosis of the superior mesenteric artery proximally. Aortic Atherosclerosis (ICD10-I70.0) and Emphysema (ICD10-J43.9). Electronically Signed   By: AFidela SalisburyM.D.   On: 10/18/2021 01:57   DG Abd Portable 1 View  Result Date: 10/18/2021 CLINICAL DATA:  Nasogastric tube placement EXAM: PORTABLE ABDOMEN - 1 VIEW COMPARISON:  None. FINDINGS: The thoracic inlet is excluded from view. No nasogastric tube is identified within the visualized chest and abdomen. Mild bibasilar atelectasis. Visualized abdominal gas pattern is unremarkable. IMPRESSION:  No nasogastric tube identified within the visualized chest and upper abdomen. Correlation for malposition within the hypopharynx is recommended. Electronically Signed   By: Fidela Salisbury M.D.   On: 10/18/2021 03:16     Assessment & Plan:  TERIN CRAGLE is a 80 y.o. male with bilateral inguinal hernia and bad  COPD on O2 and just coming off a steroid taper. He says he just completed it yesterday AM.  We had planned for him to see Dr. Melvyn Novas as outpatient and discuss risk of surgery given the pulmonary issues, and come back to me to discuss repair. Now that he has had a SBO there is more reason to consider repair, but I do not want to rush into that today with him just completing the steroids.   -Diet as tolerated  -Monitor for changes -Discussed with Dr. Manuella Ghazi -Discussed with patient to wear hernia belt with the pads on the sides where the hernias are located  Future Appointments  Date Time Provider [place]  10/31/2021  9:15 AM Tanda Rockers, MD [facility] None  11/05/2021  9:45 AM [name], MD [facility] None       [name] 10/18/2021, 8:57 AM

## 2021-10-18 NOTE — ED Notes (Signed)
Patient transported to CT 

## 2021-10-18 NOTE — ED Notes (Signed)
Pads from hernia belt repositioned for pt over hernias. Belt tightened. Informed accepting nurse on floor to reevaluated as the pads do slip out of place at times.

## 2021-10-18 NOTE — Progress Notes (Signed)
Michael Evans is a 80 y.o. male with medical history significant for chronic respiratory failure with hypoxia, COPD, essential hypertension, hyperglycemia who presents to the emergency department due to 1 day onset of lower abdominal pain which was of moderate intensity, pain was constant and worsens with palpation and movement.  He is noted to have bilateral inguinal hernias and has been using his belt at home, but had worsening pain in the groin.  EDP had reduced the hernia and NG tube was attempted, but could not be placed.  He had been seen by Dr. Constance Haw with general surgery this a.m. with plans to attempt diet advancement with clears this morning.  He was recently noted to be on steroid taper for COPD exacerbation which appears to now have resolved.  He will need continued monitoring on dietary advancement per general surgery.  He has been admitted after midnight and has been seen and evaluated at bedside.  Discussion had with general surgery at bedside.  Continue to monitor a.m. labs and continue on IV fluid.  Discussed with brother on phone 11/18.  Total care time: 30 minutes.

## 2021-10-18 NOTE — ED Notes (Addendum)
NG tube placed in right nare. Tube went in easily with no complications. Pt denied any injuries to nose. Pt tolerated well.  Xray at bedside at this time.

## 2021-10-18 NOTE — H&P (Signed)
History and Physical  Michael Evans:[id number] DOB: 07/26/1941 DOA: 10/17/2021  Referring physician: Ripley Fraise, MD PCP: [name], MD  Patient coming from: Home  Chief Complaint: Groin pain  HPI: Michael Evans is a 80 y.o. male with medical history significant for chronic respiratory failure with hypoxia, COPD, essential hypertension, hyperglycemia who presents to the emergency department due to 1 day onset of lower abdominal pain which was of moderate intensity, pain was constant and worsens with palpation and movement.  No known alleviating factors.  Patient believes pain was related to longstanding inguinal anemia which usually is painless.  He denies fevers, vomiting or any change in bowel movements. Per ED medical record, General surgery was called surgery surgical hernia repair, but he was considered to be high risk for surgery due to his underlying comorbidities.  ED Course:  In the emergency department, he was bradycardic and BP was 182/84.  Work-up in the ED showed leukocytosis and hyperglycemia.  BUN was elevated at 25.  Urinalysis was negative. CT abdomen and pelvis with contrast showed right inguinal hernia containing a single loop of distal small bowel with resultant distal small bowel obstruction with point of transition at the internal ring. Small left inguinal hernia containing and unremarkable loop of sigmoid colon. Patient was treated with IV fentanyl and Zofran.  General surgery was consulted and recommended hospitalist admission.  NG tube placement was recommended, but this was unsuccessful.  Hospitalist was asked to admit patient for further evaluation and management.  Review of Systems: Constitutional: Negative for chills and fever.  HENT: Negative for ear pain and sore throat.   Eyes: Negative for pain and visual disturbance.  Respiratory: Positive for productive cough with clear sputum.   Cardiovascular: Negative for chest pain and palpitations.   Gastrointestinal: Positive for abdominal pain and negative for vomiting.  Endocrine: Negative for polyphagia and polyuria.  Genitourinary: Positive for dysuria.  Negative for decreased urine volume, enuresis Musculoskeletal: Negative for arthralgias and back pain.  Skin: Negative for color change and rash.  Allergic/Immunologic: Negative for immunocompromised state.  Neurological: Negative for tremors, syncope, speech difficulty, weakness, light-headedness and headaches.  Hematological: Does not bruise/bleed easily.  All other systems reviewed and are negative  Past Medical History:  Diagnosis Date   Arthritis    Chronic rhinitis    COPD (chronic obstructive pulmonary disease) (HCC)    PFT 06-26-09 FEV1  1.75( 71%), FVC 3.65( 101%), FEV1% 48, TLC 5.53(104%), DLCO 55%, no BD   Hypertension    Nasal septal deviation    Nocturia    On home O2    2L N/C   OSA (obstructive sleep apnea) 04/21/2011   Skin cancer    Past Surgical History:  Procedure Laterality Date   APPENDECTOMY     BACK SURGERY     CATARACT EXTRACTION W/PHACO  01/06/2013   Procedure: CATARACT EXTRACTION PHACO AND INTRAOCULAR LENS PLACEMENT ([place]);  Surgeon: Tonny Branch, MD;  Location: [facility];  Service: Ophthalmology;  Laterality: Right;  CDE: 22.17    Social History:  reports that he quit smoking about 19 years ago. His smoking use included cigarettes. He has a 81.00 pack-year smoking history. He has never used smokeless tobacco. He reports that he does not drink alcohol and does not use drugs.   Allergies  Allergen Reactions   Amlodipine Swelling    Swelling in feet   Aspirin Other (See Comments)    Upset stomach.   Other Hypertension    Hazelnuts  Penicillins Hives    Has patient had a PCN reaction causing immediate rash, facial/tongue/throat swelling, SOB or lightheadedness with hypotension: Yes Has patient had a PCN reaction causing severe rash involving mucus membranes or skin necrosis: No Has patient had a  PCN reaction that required hospitalization No Has patient had a PCN reaction occurring within the last 10 years: No If all of the above answers are "NO", then may proceed with Cephalosporin use.    Sulfa Antibiotics Hives    Family History  Problem Relation Age of Onset   Lung cancer Mother      Prior to Admission medications   Medication Sig Start Date End Date Taking? Authorizing Provider  albuterol (VENTOLIN HFA) 108 (90 Base) MCG/ACT inhaler Inhale 1-2 puffs into the lungs every 6 (six) hours as needed for wheezing or shortness of breath. 01/01/21   [name], MD  Ascorbic Acid (VITAMIN C) 1000 MG tablet Take 1,000 mg by mouth daily.    [provider]  Budeson-Glycopyrrol-Formoterol (BREZTRI AEROSPHERE) 160-9-4.8 MCG/ACT AERO Inhale 2 puffs into the lungs in the morning and at bedtime.    [provider]  cholecalciferol (VITAMIN D) 25 MCG (1000 UNIT) tablet Take 3,000 Units by mouth daily.    [provider]  cyanocobalamin 2000 MCG tablet Take 1,000 mcg by mouth daily.    [provider]  Multiple Vitamin (MULTIVITAMIN) tablet Take 1 tablet by mouth daily.    [provider]  Nebulizer MISC 1 each by Does not apply route daily as needed. PLEASE PROVIDE NEBULIZER KIT TO INCLUDE TUBING/MASK/MOUTHPIECE 10/04/21   [name], MD  Omega-3 Fatty Acids (FISH OIL CONCENTRATE PO) Take 1 capsule by mouth daily.    [provider]  OXYGEN Inhale 2 L into the lungs continuous.    [provider]  predniSONE (DELTASONE) 20 MG tablet Take 2 tablets (40 mg total) by mouth daily with breakfast. 10/11/21   [name], MD  pyridoxine (B-6) 100 MG tablet Take 100 mg by mouth daily.    [provider]  zinc sulfate 220 (50 Zn) MG capsule Take 220 mg by mouth daily.    [provider]    Physical Exam: BP (!) 198/97   Pulse (!) 54   Temp 97.7 F (36.5 C) (Oral)   Resp 17   Ht _0  (1.651 m)   Wt  62.1 kg   SpO2 100%   BMI 22.80 kg/m   General: 80 y.o. year-old male well developed well nourished in no acute distress.  Alert and oriented x3. HEENT: NCAT, EOMI Neck: Supple, trachea medial Cardiovascular: Regular rate and rhythm with no rubs or gallops.  No thyromegaly or JVD noted.  No lower extremity edema. 2/4 pulses in all 4 extremities. Respiratory: Diffuse coarse breath sounds with scattered wheezing on auscultation.   Abdomen: Soft, nontender nondistended with normal bowel sounds x4 quadrants. Muskuloskeletal: No cyanosis, clubbing or edema noted bilaterally Neuro: CN II-XII intact, strength 5/5 x 4, sensation, reflexes intact Skin: No ulcerative lesions noted or rashes Psychiatry: Mood is appropriate for condition and setting          Labs on Admission:  Basic Metabolic Panel: Recent Labs  Lab 10/17/21 2347  NA 135  K 4.4  CL 94*  CO2 30  GLUCOSE 160*  BUN 25*  CREATININE 0.91  CALCIUM 10.6*   Liver Function Tests: Recent Labs  Lab 10/17/21 2347  AST 22  ALT 34  ALKPHOS 58  BILITOT 0.9  PROT 7.1  ALBUMIN 3.8   Recent Labs  Lab 10/17/21 2347  LIPASE 22   No results for input(s): AMMONIA in the last 168 hours. CBC: Recent Labs  Lab 10/17/21 2347  WBC 16.6*  NEUTROABS 13.7*  HGB 17.1*  HCT 51.6  MCV 92.5  PLT 223   Cardiac Enzymes: No results for input(s): CKTOTAL, CKMB, CKMBINDEX, TROPONINI in the last 168 hours.  BNP (last 3 results) Recent Labs    11/23/20 1013  BNP 66.0    ProBNP (last 3 results) No results for input(s): PROBNP in the last 8760 hours.  CBG: No results for input(s): GLUCAP in the last 168 hours.  Radiological Exams on Admission: CT ABDOMEN PELVIS W CONTRAST  Result Date: 10/18/2021 CLINICAL DATA:  Acute abdominal pain, groin pain EXAM: CT ABDOMEN AND PELVIS WITH CONTRAST TECHNIQUE: Multidetector CT imaging of the abdomen and pelvis was performed using the standard protocol following bolus administration of  intravenous contrast. CONTRAST:  127mL OMNIPAQUE IOHEXOL 300 MG/ML  SOLN COMPARISON:  None FINDINGS: Lower chest: Advanced emphysematous changes within the visualized lung bases. Superimposed mild bibasilar parenchymal scarring. Mild coronary artery calcification. Global cardiac size within normal limits. Hepatobiliary: No focal liver abnormality is seen. No gallstones, gallbladder wall thickening, or biliary dilatation. Pancreas: Unremarkable Spleen: Unremarkable Adrenals/Urinary Tract: The adrenal glands are unremarkable. The kidneys are normal in size and position. Simple cortical cyst noted bilaterally. The kidneys are otherwise unremarkable. Bladder unremarkable. Stomach/Bowel: Bilateral small inguinal hernias are present. On the left, the hernia sac contains a loop of unremarkable sigmoid colon. On the right, the hernia sac contains a single loop of distal small bowel and results in a distal small-bowel obstruction with the point of transition seen at the internal ring. The proximal small bowel is mildly dilated and fluid-filled. The distal small bowel and colon is decompressed. No free intraperitoneal gas or fluid. The appendix is absent. Vascular/Lymphatic: Extensive mixed aortoiliac atherosclerotic plaque. No aortic aneurysm. Moderate plaque at the proximal superior mesenteric artery results in a roughly 50% stenosis of this vessel focally. No pathologic adenopathy within the abdomen and pelvis. Reproductive: Prostate is unremarkable. Other: Rectum unremarkable. Musculoskeletal: Degenerative changes are seen within the lumbar spine. No acute bone abnormality. IMPRESSION: Right inguinal hernia containing a single loop of distal small bowel with resultant distal small bowel obstruction with point of transition at the internal ring. Small left inguinal hernia containing and unremarkable loop of sigmoid colon. Severe emphysema. Mild coronary artery calcification. Peripheral vascular disease with suspected 50%  stenosis of the superior mesenteric artery proximally. Aortic Atherosclerosis (ICD10-I70.0) and Emphysema (ICD10-J43.9). Electronically Signed   By: Fidela Salisbury M.D.   On: 10/18/2021 01:57   DG Abd Portable 1 View  Result Date: 10/18/2021 CLINICAL DATA:  Nasogastric tube placement EXAM: PORTABLE ABDOMEN - 1 VIEW COMPARISON:  None. FINDINGS: The thoracic inlet is excluded from view. No nasogastric tube is identified within the visualized chest and abdomen. Mild bibasilar atelectasis. Visualized abdominal gas pattern is unremarkable. IMPRESSION: No nasogastric tube identified within the visualized chest and upper abdomen. Correlation for malposition within the hypopharynx is recommended. Electronically Signed   By: Fidela Salisbury M.D.   On: 10/18/2021 03:16    EKG: I independently viewed the EKG done and my findings are as followed: EKG was not done in the ED  Assessment/Plan Present on Admission:  Small bowel obstruction (HCC)  COPD (chronic obstructive pulmonary disease) ([place])  Principal Problem:   Small bowel obstruction ([place]) Active  Problems:   Essential hypertension   COPD (chronic obstructive pulmonary disease) (HCC)   Right inguinal hernia   Dehydration   Hypertensive urgency   Chronic respiratory failure with hypoxia (HCC)   Abdominal pain secondary to right inguinal hernia with small bowel obstruction  Continue NG tube  Continue NPO at this time with plan to advance diet as tolerated Continue IV hydration Continue IV morphine 2 mg q.4h p.r.n. for moderate/severe pain Continue zofran p.r.n. for nausea/vomiting General surgery was consulted and will see patient in the morning per ED physician  Hypertensive urgency Essential hypertension (uncontrolled) Continue IV hydralazine every 6 hours as needed  Dehydration Continue IV hydration  Chronic respiratory failure with hypoxia COPD Continue Breztri Aerosphere, prednisone, DuoNebs as needed Continue Protonix to prevent  steroid induced ulcer  Hyperglycemia This was possibly secondary to patient's prednisone effect Continue to monitor CBG with morning labs   DVT prophylaxis: SCDs  Code Status: Full code  Family Communication: None at bedside  Disposition Plan:  Patient is from:                        home Anticipated DC to:                   SNF or family members home Anticipated DC date:               2-3 days Anticipated DC barriers:          Patient requires inpatient management due to abdominal pain secondary to small bowel in right inguinal hernia  Consults called: General surgery  Admission status: Observation    Bernadette Hoit MD [facility]  10/18/2021, 5:24 AM

## 2021-10-18 NOTE — ED Notes (Signed)
NG tube visualized in back in back of throat. Tube removed and failed when trying to reinsert. EDP was a bedside and gave orders to stop NG and give pt a break. Will assess the need for reinsertion at a later time.

## 2021-10-18 NOTE — Care Management Obs Status (Signed)
[facility] NOTIFICATION   Patient Details  Name: Michael Evans MRN: [id number] Date of Birth: May 24, 1941   Medicare Observation Status Notification Given:  Yes    Tommy Medal 10/18/2021, 4:18 PM

## 2021-10-18 NOTE — ED Provider Notes (Signed)
[facility] EMERGENCY DEPARTMENT Provider Note   CSN: [id number] Arrival date & time: 10/17/21  2056     History Chief Complaint  Patient presents with   Groin Pain    Michael Evans is a 80 y.o. male.  The history is provided by the patient and a relative.  Abdominal Pain Pain location:  LLQ and RLQ Pain quality: aching   Pain radiates to:  Does not radiate Pain severity:  Moderate Onset quality:  Gradual Duration:  1 day Timing:  Constant Progression:  Worsening Chronicity:  New Relieved by:  Nothing Worsened by:  Movement and palpation Associated symptoms: cough and dysuria   Associated symptoms: no chest pain, no constipation, no diarrhea, no fever and no vomiting   Patient with history of COPD on home oxygen 2 L, history of hypertension presents with abdominal pain.  Patient reports history of longstanding inguinal hernia.  He reports it is typically painless.  However over the past day he has been having lower abdominal pain.  No fevers or vomiting. No change in his bowel movements. Sister is at bedside, she reports that general surgery is considering surgical hernia repair but due to his underlying illnesses he is very high risk for surgery    Past Medical History:  Diagnosis Date   Arthritis    Chronic rhinitis    COPD (chronic obstructive pulmonary disease) ([place])    PFT 06-26-09 FEV1  1.75( 71%), FVC 3.65( 101%), FEV1% 48, TLC 5.53(104%), DLCO 55%, no BD   Hypertension    Nasal septal deviation    Nocturia    On home O2    2L N/C   OSA (obstructive sleep apnea) 04/21/2011   Skin cancer     Patient Active Problem List   Diagnosis Date Noted   Small bowel obstruction ([place]) 10/18/2021   Left inguinal hernia 10/08/2021   COPD (chronic obstructive pulmonary disease) ([place]) 09/26/2021   Acute on chronic respiratory failure ([place]) 09/24/2021   Tachycardia 09/26/2017   Influenza A 01/24/2016   Acute on chronic respiratory failure with hypoxia ([place]) 01/22/2016    SIRS (systemic inflammatory response syndrome) ([place]) 01/22/2016   Leukocytosis 01/22/2016   Hyperglycemia 01/22/2016   COPD exacerbation ([place]) 02/14/2015   Shingles rash 08/26/2014   Hypoxia 08/26/2014   OSA (obstructive sleep apnea) 04/21/2011   HOARSENESS 10/03/2009   RHINITIS 08/02/2009   COPD with emphysema ([place]) 07/05/2009   Essential hypertension, benign 06/07/2009    Past Surgical History:  Procedure Laterality Date   APPENDECTOMY     BACK SURGERY     CATARACT EXTRACTION W/PHACO  01/06/2013   Procedure: CATARACT EXTRACTION PHACO AND INTRAOCULAR LENS PLACEMENT ([place]);  Surgeon: Tonny Branch, MD;  Location: [facility];  Service: Ophthalmology;  Laterality: Right;  CDE: 22.17       Family History  Problem Relation Age of Onset   Lung cancer Mother     Social History   Tobacco Use   Smoking status: Former    Packs/day: 1.50    Years: 54.00    Pack years: 81.00    Types: Cigarettes    Quit date: 12/01/2001    Years since quitting: 19.8   Smokeless tobacco: Never  Vaping Use   Vaping Use: Never used  Substance Use Topics   Alcohol use: No   Drug use: No    Home Medications Prior to Admission medications   Medication Sig Start Date End Date Taking? Authorizing Provider  albuterol (VENTOLIN HFA) 108 (90 Base) MCG/ACT inhaler  Inhale 1-2 puffs into the lungs every 6 (six) hours as needed for wheezing or shortness of breath. 01/01/21   [name], MD  Ascorbic Acid (VITAMIN C) 1000 MG tablet Take 1,000 mg by mouth daily.    [provider]  Budeson-Glycopyrrol-Formoterol (BREZTRI AEROSPHERE) 160-9-4.8 MCG/ACT AERO Inhale 2 puffs into the lungs in the morning and at bedtime.    [provider]  cholecalciferol (VITAMIN D) 25 MCG (1000 UNIT) tablet Take 3,000 Units by mouth daily.    [provider]  cyanocobalamin 2000 MCG tablet Take 1,000 mcg by mouth daily.    [provider]  Multiple Vitamin (MULTIVITAMIN) tablet Take 1 tablet by  mouth daily.    [provider]  Nebulizer MISC 1 each by Does not apply route daily as needed. PLEASE PROVIDE NEBULIZER KIT TO INCLUDE TUBING/MASK/MOUTHPIECE 10/04/21   [name], MD  Omega-3 Fatty Acids (FISH OIL CONCENTRATE PO) Take 1 capsule by mouth daily.    [provider]  OXYGEN Inhale 2 L into the lungs continuous.    [provider]  predniSONE (DELTASONE) 20 MG tablet Take 2 tablets (40 mg total) by mouth daily with breakfast. 10/11/21   [name], MD  pyridoxine (B-6) 100 MG tablet Take 100 mg by mouth daily.    [provider]  zinc sulfate 220 (50 Zn) MG capsule Take 220 mg by mouth daily.    [provider]    Allergies    Amlodipine, Aspirin, Other, Penicillins, and Sulfa antibiotics  Review of Systems   Review of Systems  Constitutional:  Negative for fever.  Respiratory:  Positive for cough.   Cardiovascular:  Negative for chest pain.  Gastrointestinal:  Positive for abdominal pain. Negative for constipation, diarrhea and vomiting.  Genitourinary:  Positive for dysuria.  All other systems reviewed and are negative.  Physical Exam Updated Vital Signs BP (!) 173/84   Pulse 65   Temp 97.7 F (36.5 C) (Oral)   Resp 12   Ht 1.651 m (5' 5" )   Wt 62.1 kg   SpO2 97%   BMI 22.80 kg/m   Physical Exam CONSTITUTIONAL: Elderly, frail HEAD: Normocephalic/atraumatic EYES: EOMI/PERRL ENMT: Mucous membranes moist NECK: supple no meningeal signs SPINE/BACK:entire spine nontender CV: S1/S2 noted, no murmurs/rubs/gallops noted LUNGS: Coarse breath sounds bilaterally, wearing nasal ABDOMEN: soft, mild LLQ and RLQ tenderness.  No distention., no rebound or guarding, bowel sounds noted throughout abdomen.?  Left inguinal hernia that is easily reducible.  There are  no large incarcerated hernias noted on the right side GU:no cva tenderness, no scrotal tenderness or edema. NEURO: Pt is awake/alert/appropriate, moves  all extremitiesx4.  No facial droop.   EXTREMITIES: pulses normal/equal, full ROM SKIN: warm, color normal PSYCH: no abnormalities of mood noted, alert and oriented to situation  ED Results / Procedures / Treatments   Labs (all labs ordered are listed, but only abnormal results are displayed) Labs Reviewed  CBC WITH DIFFERENTIAL/PLATELET - Abnormal; Notable for the following components:      Result Value   WBC 16.6 (*)    Hemoglobin 17.1 (*)    Neutro Abs 13.7 (*)    Monocytes Absolute 1.2 (*)    Abs Immature Granulocytes 0.25 (*)    All other components within normal limits  COMPREHENSIVE METABOLIC PANEL - Abnormal; Notable for the following components:   Chloride 94 (*)    Glucose, Bld 160 (*)    BUN 25 (*)    Calcium  10.6 (*)    All other components within normal limits  RESP PANEL BY RT-PCR (FLU A&B, COVID) ARPGX2  URINALYSIS, ROUTINE W REFLEX MICROSCOPIC  LIPASE, BLOOD  MAGNESIUM  PHOSPHORUS    EKG None  Radiology CT ABDOMEN PELVIS W CONTRAST  Result Date: 10/18/2021 CLINICAL DATA:  Acute abdominal pain, groin pain EXAM: CT ABDOMEN AND PELVIS WITH CONTRAST TECHNIQUE: Multidetector CT imaging of the abdomen and pelvis was performed using the standard protocol following bolus administration of intravenous contrast. CONTRAST:  181m OMNIPAQUE IOHEXOL 300 MG/ML  SOLN COMPARISON:  None FINDINGS: Lower chest: Advanced emphysematous changes within the visualized lung bases. Superimposed mild bibasilar parenchymal scarring. Mild coronary artery calcification. Global cardiac size within normal limits. Hepatobiliary: No focal liver abnormality is seen. No gallstones, gallbladder wall thickening, or biliary dilatation. Pancreas: Unremarkable Spleen: Unremarkable Adrenals/Urinary Tract: The adrenal glands are unremarkable. The kidneys are normal in size and position. Simple cortical cyst noted bilaterally. The kidneys are otherwise unremarkable. Bladder unremarkable. Stomach/Bowel:  Bilateral small inguinal hernias are present. On the left, the hernia sac contains a loop of unremarkable sigmoid colon. On the right, the hernia sac contains a single loop of distal small bowel and results in a distal small-bowel obstruction with the point of transition seen at the internal ring. The proximal small bowel is mildly dilated and fluid-filled. The distal small bowel and colon is decompressed. No free intraperitoneal gas or fluid. The appendix is absent. Vascular/Lymphatic: Extensive mixed aortoiliac atherosclerotic plaque. No aortic aneurysm. Moderate plaque at the proximal superior mesenteric artery results in a roughly 50% stenosis of this vessel focally. No pathologic adenopathy within the abdomen and pelvis. Reproductive: Prostate is unremarkable. Other: Rectum unremarkable. Musculoskeletal: Degenerative changes are seen within the lumbar spine. No acute bone abnormality. IMPRESSION: Right inguinal hernia containing a single loop of distal small bowel with resultant distal small bowel obstruction with point of transition at the internal ring. Small left inguinal hernia containing and unremarkable loop of sigmoid colon. Severe emphysema. Mild coronary artery calcification. Peripheral vascular disease with suspected 50% stenosis of the superior mesenteric artery proximally. Aortic Atherosclerosis (ICD10-I70.0) and Emphysema (ICD10-J43.9). Electronically Signed   By: AFidela SalisburyM.D.   On: 10/18/2021 01:57   DG Abd Portable 1 View  Result Date: 10/18/2021 CLINICAL DATA:  Nasogastric tube placement EXAM: PORTABLE ABDOMEN - 1 VIEW COMPARISON:  None. FINDINGS: The thoracic inlet is excluded from view. No nasogastric tube is identified within the visualized chest and abdomen. Mild bibasilar atelectasis. Visualized abdominal gas pattern is unremarkable. IMPRESSION: No nasogastric tube identified within the visualized chest and upper abdomen. Correlation for malposition within the hypopharynx is  recommended. Electronically Signed   By: AFidela SalisburyM.D.   On: 10/18/2021 03:16    Procedures Procedures   Medications Ordered in ED Medications  fentaNYL (SUBLIMAZE) injection 50 mcg (50 mcg Intravenous Given 10/17/21 2357)  iohexol (OMNIPAQUE) 300 MG/ML solution 100 mL (100 mLs Intravenous Contrast Given 10/18/21 0123)  fentaNYL (SUBLIMAZE) injection 100 mcg (100 mcg Intravenous Given 10/18/21 0239)  ondansetron (ZOFRAN) injection 4 mg (4 mg Intravenous Given 10/18/21 0240)    ED Course  I have reviewed the triage vital signs and the nursing notes.  Pertinent labs & imaging results that were available during my care of the patient were reviewed by me and considered in my medical decision making (see chart for details).    MDM Rules/Calculators/A&P  Patient presents for abdominal pain.  He reported longtime hernia that is usually not painful, but now have abdominal pain.  I do not see any large abdominal or inguinal hernias.  However he does have tenderness in his lower abdomen.  We will need to proceed with CT imaging of his abdomen pelvis 3:30 AM CT scan revealed small bowel obstruction involving the right inguinal hernia.  I discussed the case with Dr. Constance Haw with general surgery.  She requested hospitalist admission, place NG tube and she will see in the morning Multiple attempts at NG tube placement were unsuccessful. However patient appears improved and is resting comfortably. I attempted to do a bedside reduction of the right inguinal hernia.  There was no large bulge identified.  Patient had no focal tenderness.  It is possible that the hernia has begun to reduce spontaneously. Will continue with plan for admission and evaluation by Dr Constance Haw in the morning.  Discussed with Dr. Josephine Cables for admission  Final Clinical Impression(s) / ED Diagnoses Final diagnoses:  Small bowel obstruction ([place])  Bilateral inguinal hernia with obstruction and  without gangrene, recurrence not specified    Rx / DC Orders ED Discharge Orders     None        Ripley Fraise, MD 10/18/21 [phone]

## 2021-10-19 DIAGNOSIS — J9611 Chronic respiratory failure with hypoxia: Secondary | ICD-10-CM | POA: Diagnosis not present

## 2021-10-19 DIAGNOSIS — K4000 Bilateral inguinal hernia, with obstruction, without gangrene, not specified as recurrent: Secondary | ICD-10-CM | POA: Diagnosis not present

## 2021-10-19 DIAGNOSIS — I10 Essential (primary) hypertension: Secondary | ICD-10-CM | POA: Diagnosis not present

## 2021-10-19 DIAGNOSIS — E860 Dehydration: Secondary | ICD-10-CM | POA: Diagnosis not present

## 2021-10-19 DIAGNOSIS — K56600 Partial intestinal obstruction, unspecified as to cause: Secondary | ICD-10-CM

## 2021-10-19 DIAGNOSIS — J449 Chronic obstructive pulmonary disease, unspecified: Secondary | ICD-10-CM | POA: Diagnosis not present

## 2021-10-19 DIAGNOSIS — K4090 Unilateral inguinal hernia, without obstruction or gangrene, not specified as recurrent: Secondary | ICD-10-CM | POA: Diagnosis not present

## 2021-10-19 DIAGNOSIS — K56609 Unspecified intestinal obstruction, unspecified as to partial versus complete obstruction: Secondary | ICD-10-CM | POA: Diagnosis not present

## 2021-10-19 LAB — CBC
HCT: 44.4 % (ref 39.0–52.0)
Hemoglobin: 14.3 g/dL (ref 13.0–17.0)
MCH: 30.8 pg (ref 26.0–34.0)
MCHC: 32.2 g/dL (ref 30.0–36.0)
MCV: 95.7 fL (ref 80.0–100.0)
Platelets: 182 10*3/uL (ref 150–400)
RBC: 4.64 MIL/uL (ref 4.22–5.81)
RDW: 13.8 % (ref 11.5–15.5)
WBC: 9.8 10*3/uL (ref 4.0–10.5)
nRBC: 0 % (ref 0.0–0.2)

## 2021-10-19 LAB — MAGNESIUM: Magnesium: 2 mg/dL (ref 1.7–2.4)

## 2021-10-19 LAB — COMPREHENSIVE METABOLIC PANEL
ALT: 23 U/L (ref 0–44)
AST: 16 U/L (ref 15–41)
Albumin: 2.7 g/dL — ABNORMAL LOW (ref 3.5–5.0)
Alkaline Phosphatase: 39 U/L (ref 38–126)
Anion gap: 6 (ref 5–15)
BUN: 19 mg/dL (ref 8–23)
CO2: 30 mmol/L (ref 22–32)
Calcium: 8.8 mg/dL — ABNORMAL LOW (ref 8.9–10.3)
Chloride: 102 mmol/L (ref 98–111)
Creatinine, Ser: 0.96 mg/dL (ref 0.61–1.24)
GFR, Estimated: >60 mL/min (ref >60)
Glucose, Bld: 110 mg/dL — ABNORMAL HIGH (ref 70–99)
Potassium: 3.9 mmol/L (ref 3.5–5.1)
Sodium: 138 mmol/L (ref 135–145)
Total Bilirubin: 1.1 mg/dL (ref 0.3–1.2)
Total Protein: 5 g/dL — ABNORMAL LOW (ref 6.5–8.1)

## 2021-10-19 MED ORDER — ONDANSETRON 8 MG PO TBDP: 8.0000 mg | ORAL_TABLET | Freq: Three times a day (TID) | ORAL | 0 refills | Status: DC | PRN

## 2021-10-19 NOTE — Plan of Care (Signed)
  Problem: Education: Goal: Knowledge of General Education information will improve Description: Including pain rating scale, medication(s)/side effects and non-pharmacologic comfort measures Outcome: Progressing   Problem: Coping: Goal: Level of anxiety will decrease Outcome: Progressing   

## 2021-10-19 NOTE — Progress Notes (Signed)
[facility] Progress Note     Subjective: Doing well and eating. Had a BM in the ED yesterday. Passing flatus.   Objective: Vital signs in last 24 hours: Temp:  [97.7 F (36.5 C)-99 F (37.2 C)] 97.7 F (36.5 C) (11/19 0500) Pulse Rate:  [47-82] 47 (11/19 0500) Resp:  [16-20] 16 (11/19 0500) BP: (98-114)/(49-70) 106/49 (11/19 0500) SpO2:  [95 %-100 %] 97 % (11/19 0500)    Intake/Output from previous day: 11/18 0701 - 11/19 0700 In: 1387.5 [P.O.:720; I.V.:667.5] Out: 1175 [Urine:1175] Intake/Output this shift: Total I/O In: 240 [P.O.:240] Out: -   General appearance: alert and no distress Resp: normal work of breathing GI: soft, nondistended, nontender, reduced hernias on right and left, hernia belt in place  Lab Results:  Recent Labs    10/17/21 2347 10/19/21 0631  WBC 16.6* 9.8  HGB 17.1* 14.3  HCT 51.6 44.4  PLT 223 182   BMET Recent Labs    10/17/21 2347 10/19/21 0631  NA 135 138  K 4.4 3.9  CL 94* 102  CO2 30 30  GLUCOSE 160* 110*  BUN 25* 19  CREATININE 0.91 0.96  CALCIUM 10.6* 8.8*   PT/INR No results for input(s): LABPROT, INR in the last 72 hours.  Studies/Results: CT ABDOMEN PELVIS W CONTRAST  Result Date: 10/18/2021 CLINICAL DATA:  Acute abdominal pain, groin pain EXAM: CT ABDOMEN AND PELVIS WITH CONTRAST TECHNIQUE: Multidetector CT imaging of the abdomen and pelvis was performed using the standard protocol following bolus administration of intravenous contrast. CONTRAST:  110mL OMNIPAQUE IOHEXOL 300 MG/ML  SOLN COMPARISON:  None FINDINGS: Lower chest: Advanced emphysematous changes within the visualized lung bases. Superimposed mild bibasilar parenchymal scarring. Mild coronary artery calcification. Global cardiac size within normal limits. Hepatobiliary: No focal liver abnormality is seen. No gallstones, gallbladder wall thickening, or biliary dilatation. Pancreas: Unremarkable Spleen: Unremarkable Adrenals/Urinary Tract:  The adrenal glands are unremarkable. The kidneys are normal in size and position. Simple cortical cyst noted bilaterally. The kidneys are otherwise unremarkable. Bladder unremarkable. Stomach/Bowel: Bilateral small inguinal hernias are present. On the left, the hernia sac contains a loop of unremarkable sigmoid colon. On the right, the hernia sac contains a single loop of distal small bowel and results in a distal small-bowel obstruction with the point of transition seen at the internal ring. The proximal small bowel is mildly dilated and fluid-filled. The distal small bowel and colon is decompressed. No free intraperitoneal gas or fluid. The appendix is absent. Vascular/Lymphatic: Extensive mixed aortoiliac atherosclerotic plaque. No aortic aneurysm. Moderate plaque at the proximal superior mesenteric artery results in a roughly 50% stenosis of this vessel focally. No pathologic adenopathy within the abdomen and pelvis. Reproductive: Prostate is unremarkable. Other: Rectum unremarkable. Musculoskeletal: Degenerative changes are seen within the lumbar spine. No acute bone abnormality. IMPRESSION: Right inguinal hernia containing a single loop of distal small bowel with resultant distal small bowel obstruction with point of transition at the internal ring. Small left inguinal hernia containing and unremarkable loop of sigmoid colon. Severe emphysema. Mild coronary artery calcification. Peripheral vascular disease with suspected 50% stenosis of the superior mesenteric artery proximally. Aortic Atherosclerosis (ICD10-I70.0) and Emphysema (ICD10-J43.9). Electronically Signed   By: Fidela Salisbury M.D.   On: 10/18/2021 01:57   DG Abd Portable 1 View  Result Date: 10/18/2021 CLINICAL DATA:  Nasogastric tube placement EXAM: PORTABLE ABDOMEN - 1 VIEW COMPARISON:  None. FINDINGS: The thoracic inlet is excluded from view. No nasogastric tube is identified within the visualized  chest and abdomen. Mild bibasilar  atelectasis. Visualized abdominal gas pattern is unremarkable. IMPRESSION: No nasogastric tube identified within the visualized chest and upper abdomen. Correlation for malposition within the hypopharynx is recommended. Electronically Signed   By: Fidela Salisbury M.D.   On: 10/18/2021 03:16    Anti-infectives: Anti-infectives (From admission, onward)    None       Assessment/Plan: Patient with bilateral inguinal hernias that had a loop of SB and SBO. Doing good after reduction. He is seeing Dr. Melvyn Novas in December before seeing me. We have discussed that we really need to consider repair given his SBO.  He just came off the steroid taper.  Will see as outpatient. Return precautions discussed.  Future Appointments  Date Time Provider [place]  10/31/2021  9:15 AM Tanda Rockers, MD [facility] None  11/05/2021  9:45 AM [name], MD [facility] None     LOS: 0 days    [name] 10/19/2021

## 2021-10-19 NOTE — Discharge Summary (Addendum)
Physician Discharge Summary  Michael Evans TKW:[id number] DOB: 07-27-1941 DOA: 10/17/2021  PCP: [name], MD  Admit date: 10/17/2021 Discharge date: 10/20/2021  Time spent: 35 minutes  Recommendations for Outpatient Follow-up:  Repeat basic metabolic panel to follow cholesterol function Outpatient follow-up with pulmonologist and general surgery as instructed. Reassess blood pressure and adjust antihypertensive treatment as needed.   Discharge Diagnoses:  Principal Problem:   Partial small bowel obstruction (HCC) Active Problems:   Essential hypertension   COPD (chronic obstructive pulmonary disease) (HCC)   Right inguinal hernia   Dehydration   Hypertensive urgency   Chronic respiratory failure with hypoxia (HCC)   Abdominal pain   Discharge Condition: Stable and improved.  Discharge home with instruction to follow-up with pulmonologist, PCP and general surgery as an outpatient.  CODE STATUS: Full code.  Diet recommendation: Heart healthy diet. (Soft/easy to digest)  Filed Weights   10/17/21 2130  Weight: 62.1 kg    History of present illness:  As per H&P written by Dr. Josephine Cables on 10/18/2021 Michael Evans is a 80 y.o. male with medical history significant for chronic respiratory failure with hypoxia, COPD, essential hypertension, hyperglycemia who presents to the emergency department due to 1 day onset of lower abdominal pain which was of moderate intensity, pain was constant and worsens with palpation and movement.  No known alleviating factors.  Patient believes pain was related to longstanding inguinal anemia which usually is painless.  He denies fevers, vomiting or any change in bowel movements. Per ED medical record, General surgery was called surgery surgical hernia repair, but he was considered to be high risk for surgery due to his underlying comorbidities.   ED Course:  In the emergency department, he was bradycardic and BP was 182/84.  Work-up in the ED  showed leukocytosis and hyperglycemia.  BUN was elevated at 25.  Urinalysis was negative. CT abdomen and pelvis with contrast showed right inguinal hernia containing a single loop of distal small bowel with resultant distal small bowel obstruction with point of transition at the internal ring. Small left inguinal hernia containing and unremarkable loop of sigmoid colon. Patient was treated with IV fentanyl and Zofran.  General surgery was consulted and recommended hospitalist admission.  NG tube placement was recommended, but this was unsuccessful.  Hospitalist was asked to admit patient for further evaluation and management.  Hospital Course:  1-partial SBO -Patient with bilateral well-known inguinal hernia, presenting with right-sided intestinal protrusion to his right inguinal hernia and abdominal pain. -Hernia bulge was able to be reduced in the emergency department -Patient expressed no further episode of nausea, vomiting and just mild discomfort in his right lower quadrant.   -Patient is tolerating diet, passing gas and had a bowel movement. -Case discussed with general surgery who recommended discharge on as needed antiemetics and soft/easy to digest diet.  Outpatient follow-up for elective surgical repair.  2-COPD/chronic respiratory failure -Continue oxygen supplementation -No active exacerbation appreciated currently -Continue the use of home bronchodilator management. -Outpatient follow-up with pulmonologist  3-gastroesophageal reflux disease/GI prophylaxis -Continue PPI.  4-hypertension -Resume home antihypertensive agents -Advised to follow heart healthy diet.  5-dehydration -In the setting of GI losses and inability to eat while experiencing partial SBO -Resolved -Fluid resuscitation provided. -Patient advised to maintain adequate hydration.   Procedures: See below for x-ray reports.  Consultations: General surgery  Discharge Exam: Vitals:   10/19/21 0500  10/19/21 1408  BP: (!) 106/49 (!) 111/59  Pulse: (!) 47 (!) 57  Resp:  16 18  Temp: 97.7 F (36.5 C) 98.7 F (37.1 C)  SpO2: 97% 100%    General: Alert, in no acute distress; reports some mild discomfort in his right lower quadrant, but tolerating diet and expressing no nausea or vomiting.  Patient is passing gas and had a bowel movement yesterday. Cardiovascular: S1 and S2, no rubs, no gallops, no JVD. Respiratory: Positive scattered rhonchi bilaterally; no using accessory muscles.  Good saturation on chronic 2 L supplementation. Abdomen: Soft, positive bowel sounds, nondistended.  Hernia belt in place. Extremities: No cyanosis or clubbing.  Discharge Instructions   Discharge Instructions     Discharge instructions   Complete by: As directed    Take medications as prescribed Maintain adequate hydration Soft/easy to digest diet recommended Follow-up with general surgery and pulmonology service as instructed      Allergies as of 10/19/2021       Reactions   Amlodipine Swelling   Swelling in feet   Aspirin Other (See Comments)   Upset stomach.   Other Hypertension   Hazelnuts   Penicillins Hives   Has patient had a PCN reaction causing immediate rash, facial/tongue/throat swelling, SOB or lightheadedness with hypotension: Yes Has patient had a PCN reaction causing severe rash involving mucus membranes or skin necrosis: No Has patient had a PCN reaction that required hospitalization No Has patient had a PCN reaction occurring within the last 10 years: No If all of the above answers are "NO", then may proceed with Cephalosporin use.   Sulfa Antibiotics Hives        Medication List     STOP taking these medications    predniSONE 20 MG tablet Commonly known as: DELTASONE       TAKE these medications    albuterol 108 (90 Base) MCG/ACT inhaler Commonly known as: VENTOLIN HFA Inhale 1-2 puffs into the lungs every 6 (six) hours as needed for wheezing or shortness  of breath.   Breztri Aerosphere 160-9-4.8 MCG/ACT Aero Generic drug: Budeson-Glycopyrrol-Formoterol Inhale 2 puffs into the lungs in the morning and at bedtime.   cholecalciferol 25 MCG (1000 UNIT) tablet Commonly known as: VITAMIN D Take 3,000 Units by mouth daily.   cyanocobalamin 2000 MCG tablet Take 1,000 mcg by mouth daily.   FISH OIL CONCENTRATE PO Take 1 capsule by mouth daily.   multivitamin tablet Take 1 tablet by mouth daily.   Nebulizer Misc 1 each by Does not apply route daily as needed. PLEASE PROVIDE NEBULIZER KIT TO INCLUDE TUBING/MASK/MOUTHPIECE   ondansetron 8 MG disintegrating tablet Commonly known as: Zofran ODT Take 1 tablet (8 mg total) by mouth every 8 (eight) hours as needed for nausea or vomiting.   OXYGEN Inhale 2 L into the lungs continuous.   pyridoxine 100 MG tablet Commonly known as: B-6 Take 100 mg by mouth daily.   vitamin C 1000 MG tablet Take 1,000 mg by mouth daily.   zinc sulfate 220 (50 Zn) MG capsule Take 220 mg by mouth daily.       Allergies  Allergen Reactions   Amlodipine Swelling    Swelling in feet   Aspirin Other (See Comments)    Upset stomach.   Other Hypertension    Hazelnuts   Penicillins Hives    Has patient had a PCN reaction causing immediate rash, facial/tongue/throat swelling, SOB or lightheadedness with hypotension: Yes Has patient had a PCN reaction causing severe rash involving mucus membranes or skin necrosis: No Has patient had a PCN reaction that required  hospitalization No Has patient had a PCN reaction occurring within the last 10 years: No If all of the above answers are "NO", then may proceed with Cephalosporin use.    Sulfa Antibiotics Hives    Follow-up Information     [name], MD. Schedule an appointment as soon as possible for a visit in 10 day(s).   Specialty: Family Medicine Contact information: [address] [place] [address] [phone]                  The results of significant diagnostics from this hospitalization (including imaging, microbiology, ancillary and laboratory) are listed below for reference.    Significant Diagnostic Studies: DG Chest 2 View  Result Date: 09/26/2021 CLINICAL DATA:  Cough, dyspnea, COPD EXAM: CHEST - 2 VIEW COMPARISON:  09/24/2021 chest radiograph. FINDINGS: Stable cardiomediastinal silhouette with normal heart size. No pneumothorax. No pleural effusion. Emphysema. Mildly hyperinflated lungs. No pulmonary edema. Mild eventration of right greater than left hemidiaphragms. Mild streaky curvilinear bibasilar lung opacities, similar. No acute consolidative airspace disease. IMPRESSION: 1. Emphysema and mildly hyperinflated lungs, compatible with reported COPD. 2. Chronic mild streaky bibasilar lung opacities, compatible with scarring as seen on recent chest CT. No acute cardiopulmonary disease. Electronically Signed   By: Ilona Sorrel M.D.   On: 09/26/2021 14:40   DG Chest 2 View  Result Date: 09/24/2021 CLINICAL DATA:  Shortness of breath, cough, congestion EXAM: CHEST - 2 VIEW COMPARISON:  Chest radiograph and chest CT 09/17/2021 FINDINGS: The cardiomediastinal silhouette is stable. The lungs are hyperinflated consistent with underlying COPD. Linear opacities in the left midlung and right base likely reflect scar as seen on prior CT. There is no new focal airspace disease. There is no pleural effusion or pneumothorax. The bones are stable. IMPRESSION: Stable chest with no radiographic evidence of acute cardiopulmonary process. Electronically Signed   By: Valetta Mole M.D.   On: 09/24/2021 11:09   CT ABDOMEN PELVIS W CONTRAST  Result Date: 10/18/2021 CLINICAL DATA:  Acute abdominal pain, groin pain EXAM: CT ABDOMEN AND PELVIS WITH CONTRAST TECHNIQUE: Multidetector CT imaging of the abdomen and pelvis was performed using the standard protocol following bolus administration of intravenous contrast. CONTRAST:  150m  OMNIPAQUE IOHEXOL 300 MG/ML  SOLN COMPARISON:  None FINDINGS: Lower chest: Advanced emphysematous changes within the visualized lung bases. Superimposed mild bibasilar parenchymal scarring. Mild coronary artery calcification. Global cardiac size within normal limits. Hepatobiliary: No focal liver abnormality is seen. No gallstones, gallbladder wall thickening, or biliary dilatation. Pancreas: Unremarkable Spleen: Unremarkable Adrenals/Urinary Tract: The adrenal glands are unremarkable. The kidneys are normal in size and position. Simple cortical cyst noted bilaterally. The kidneys are otherwise unremarkable. Bladder unremarkable. Stomach/Bowel: Bilateral small inguinal hernias are present. On the left, the hernia sac contains a loop of unremarkable sigmoid colon. On the right, the hernia sac contains a single loop of distal small bowel and results in a distal small-bowel obstruction with the point of transition seen at the internal ring. The proximal small bowel is mildly dilated and fluid-filled. The distal small bowel and colon is decompressed. No free intraperitoneal gas or fluid. The appendix is absent. Vascular/Lymphatic: Extensive mixed aortoiliac atherosclerotic plaque. No aortic aneurysm. Moderate plaque at the proximal superior mesenteric artery results in a roughly 50% stenosis of this vessel focally. No pathologic adenopathy within the abdomen and pelvis. Reproductive: Prostate is unremarkable. Other: Rectum unremarkable. Musculoskeletal: Degenerative changes are seen within the lumbar spine. No acute bone abnormality. IMPRESSION:  Right inguinal hernia containing a single loop of distal small bowel with resultant distal small bowel obstruction with point of transition at the internal ring. Small left inguinal hernia containing and unremarkable loop of sigmoid colon. Severe emphysema. Mild coronary artery calcification. Peripheral vascular disease with suspected 50% stenosis of the superior mesenteric  artery proximally. Aortic Atherosclerosis (ICD10-I70.0) and Emphysema (ICD10-J43.9). Electronically Signed   By: Fidela Salisbury M.D.   On: 10/18/2021 01:57   DG Abd Portable 1 View  Result Date: 10/18/2021 CLINICAL DATA:  Nasogastric tube placement EXAM: PORTABLE ABDOMEN - 1 VIEW COMPARISON:  None. FINDINGS: The thoracic inlet is excluded from view. No nasogastric tube is identified within the visualized chest and abdomen. Mild bibasilar atelectasis. Visualized abdominal gas pattern is unremarkable. IMPRESSION: No nasogastric tube identified within the visualized chest and upper abdomen. Correlation for malposition within the hypopharynx is recommended. Electronically Signed   By: Fidela Salisbury M.D.   On: 10/18/2021 03:16    Microbiology: Recent Results (from the past 240 hour(s))  Resp Panel by RT-PCR (Flu A&B, Covid) Nasopharyngeal Swab     Status: None   Collection Time: 10/18/21  2:13 AM   Specimen: Nasopharyngeal Swab; Nasopharyngeal(NP) swabs in vial transport medium  Result Value Ref Range Status   SARS Coronavirus 2 by RT PCR NEGATIVE NEGATIVE Final    Comment: (NOTE) SARS-CoV-2 target nucleic acids are NOT DETECTED.  The SARS-CoV-2 RNA is generally detectable in upper respiratory specimens during the acute phase of infection. The lowest concentration of SARS-CoV-2 viral copies this assay can detect is 138 copies/mL. A negative result does not preclude SARS-Cov-2 infection and should not be used as the sole basis for treatment or other patient management decisions. A negative result may occur with  improper specimen collection/handling, submission of specimen other than nasopharyngeal swab, presence of viral mutation(s) within the areas targeted by this assay, and inadequate number of viral copies(<138 copies/mL). A negative result must be combined with clinical observations, patient history, and epidemiological information. The expected result is Negative.  Fact Sheet for  Patients:  EntrepreneurPulse.com.au  Fact Sheet for Healthcare Providers:  IncredibleEmployment.be  This test is no t yet approved or cleared by the [place] FDA and  has been authorized for detection and/or diagnosis of SARS-CoV-2 by FDA under an Emergency Use Authorization (EUA). This EUA will remain  in effect (meaning this test can be used) for the duration of the COVID-19 declaration under Section 564(b)(1) of the Act, 21 U.S.C.section 360bbb-3(b)(1), unless the authorization is terminated  or revoked sooner.       Influenza A by PCR NEGATIVE NEGATIVE Final   Influenza B by PCR NEGATIVE NEGATIVE Final    Comment: (NOTE) The Xpert Xpress SARS-CoV-2/FLU/RSV plus assay is intended as an aid in the diagnosis of influenza from Nasopharyngeal swab specimens and should not be used as a sole basis for treatment. Nasal washings and aspirates are unacceptable for Xpert Xpress SARS-CoV-2/FLU/RSV testing.  Fact Sheet for Patients: EntrepreneurPulse.com.au  Fact Sheet for Healthcare Providers: IncredibleEmployment.be  This test is not yet approved or cleared by the [place] FDA and has been authorized for detection and/or diagnosis of SARS-CoV-2 by FDA under an Emergency Use Authorization (EUA). This EUA will remain in effect (meaning this test can be used) for the duration of the COVID-19 declaration under Section 564(b)(1) of the Act, 21 U.S.C. section 360bbb-3(b)(1), unless the authorization is terminated or revoked.  Performed at [facility], [address], [place], [place] [address]  Labs: Basic Metabolic Panel: Recent Labs  Lab 10/17/21 2347 10/18/21 0000 10/19/21 0631  NA 135  --  138  K 4.4  --  3.9  CL 94*  --  102  CO2 30  --  30  GLUCOSE 160*  --  110*  BUN 25*  --  19  CREATININE 0.91  --  0.96  CALCIUM 10.6*  --  8.8*  MG  --  2.3 2.0  PHOS  --  3.5  --    Liver  Function Tests: Recent Labs  Lab 10/17/21 2347 10/19/21 0631  AST 22 16  ALT 34 23  ALKPHOS 58 39  BILITOT 0.9 1.1  PROT 7.1 5.0*  ALBUMIN 3.8 2.7*   Recent Labs  Lab 10/17/21 2347  LIPASE 22   CBC: Recent Labs  Lab 10/17/21 2347 10/19/21 0631  WBC 16.6* 9.8  NEUTROABS 13.7*  --   HGB 17.1* 14.3  HCT 51.6 44.4  MCV 92.5 95.7  PLT 223 182   BNP (last 3 results) Recent Labs    11/23/20 1013  BNP 66.0   Signed:  Barton Dubois MD.  [facility] 10/19/2021, 4:08 PM

## 2021-10-19 NOTE — Plan of Care (Signed)
Problem: Skin Integrity: Goal: Risk for impaired skin integrity will decrease 10/19/2021 1740 by Vella Raring, RN Outcome: Adequate for Discharge 10/19/2021 1739 by Vella Raring, RN Outcome: Adequate for Discharge 10/19/2021 1736 by Vella Raring, RN Outcome: Adequate for Discharge   Problem: Safety: Goal: Ability to remain free from injury will improve 10/19/2021 1740 by Vella Raring, RN Outcome: Adequate for Discharge 10/19/2021 1739 by Vella Raring, RN Outcome: Adequate for Discharge 10/19/2021 1736 by Vella Raring, RN Outcome: Adequate for Discharge   Problem: Pain Managment: Goal: General experience of comfort will improve 10/19/2021 1740 by Vella Raring, RN Outcome: Adequate for Discharge 10/19/2021 1739 by Vella Raring, RN Outcome: Adequate for Discharge 10/19/2021 1736 by Vella Raring, RN Outcome: Adequate for Discharge   Problem: Elimination: Goal: Will not experience complications related to bowel motility 10/19/2021 1740 by Vella Raring, RN Outcome: Adequate for Discharge 10/19/2021 1739 by Vella Raring, RN Outcome: Adequate for Discharge 10/19/2021 1736 by Vella Raring, RN Outcome: Adequate for Discharge Goal: Will not experience complications related to urinary retention 10/19/2021 1740 by Vella Raring, RN Outcome: Adequate for Discharge 10/19/2021 1739 by Vella Raring, RN Outcome: Adequate for Discharge 10/19/2021 1736 by Vella Raring, RN Outcome: Adequate for Discharge   Problem: Coping: Goal: Level of anxiety will decrease 10/19/2021 1740 by Vella Raring, RN Outcome: Adequate for Discharge 10/19/2021 1739 by Vella Raring, RN Outcome: Adequate for Discharge 10/19/2021 1736 by Vella Raring, RN Outcome: Adequate for Discharge 10/19/2021 0922 by Vella Raring, RN Outcome: Progressing   Problem: Nutrition: Goal: Adequate  nutrition will be maintained 10/19/2021 1740 by Vella Raring, RN Outcome: Adequate for Discharge 10/19/2021 1739 by Vella Raring, RN Outcome: Adequate for Discharge 10/19/2021 1736 by Vella Raring, RN Outcome: Adequate for Discharge   Problem: Activity: Goal: Risk for activity intolerance will decrease 10/19/2021 1740 by Vella Raring, RN Outcome: Adequate for Discharge 10/19/2021 1739 by Vella Raring, RN Outcome: Adequate for Discharge 10/19/2021 1736 by Vella Raring, RN Outcome: Adequate for Discharge   Problem: Clinical Measurements: Goal: Ability to maintain clinical measurements within normal limits will improve 10/19/2021 1740 by Vella Raring, RN Outcome: Adequate for Discharge 10/19/2021 1739 by Vella Raring, RN Outcome: Adequate for Discharge 10/19/2021 1736 by Vella Raring, RN Outcome: Adequate for Discharge Goal: Will remain free from infection 10/19/2021 1740 by Vella Raring, RN Outcome: Adequate for Discharge 10/19/2021 1739 by Vella Raring, RN Outcome: Adequate for Discharge 10/19/2021 1736 by Vella Raring, RN Outcome: Adequate for Discharge Goal: Diagnostic test results will improve 10/19/2021 1740 by Vella Raring, RN Outcome: Adequate for Discharge 10/19/2021 1739 by Vella Raring, RN Outcome: Adequate for Discharge 10/19/2021 1736 by Vella Raring, RN Outcome: Adequate for Discharge Goal: Respiratory complications will improve 10/19/2021 1740 by Vella Raring, RN Outcome: Adequate for Discharge 10/19/2021 1739 by Vella Raring, RN Outcome: Adequate for Discharge 10/19/2021 1736 by Vella Raring, RN Outcome: Adequate for Discharge Goal: Cardiovascular complication will be avoided 10/19/2021 1740 by Vella Raring, RN Outcome: Adequate for Discharge 10/19/2021 1739 by Vella Raring, RN Outcome: Adequate for  Discharge 10/19/2021 1736 by Vella Raring, RN Outcome: Adequate for Discharge   Problem: Health Behavior/Discharge Planning: Goal: Ability to manage health-related needs will improve 10/19/2021 1740 by Vella Raring, RN Outcome: Adequate for Discharge 10/19/2021 1739 by Vella Raring, RN  Outcome: Adequate for Discharge 10/19/2021 1736 by Vella Raring, RN Outcome: Adequate for Discharge   Problem: Education: Goal: Knowledge of General Education information will improve Description: Including pain rating scale, medication(s)/side effects and non-pharmacologic comfort measures 10/19/2021 1740 by Vella Raring, RN Outcome: Adequate for Discharge 10/19/2021 1739 by Vella Raring, RN Outcome: Adequate for Discharge 10/19/2021 1736 by Vella Raring, RN Outcome: Adequate for Discharge 10/19/2021 0922 by Vella Raring, RN Outcome: Progressing

## 2021-10-19 NOTE — Plan of Care (Signed)
  Problem: Education: Goal: Knowledge of General Education information will improve Description: Including pain rating scale, medication(s)/side effects and non-pharmacologic comfort measures 10/19/2021 1736 by Vella Raring, RN Outcome: Adequate for Discharge 10/19/2021 [phone] by Vella Raring, RN Outcome: Progressing   Problem: Health Behavior/Discharge Planning: Goal: Ability to manage health-related needs will improve Outcome: Adequate for Discharge   Problem: Clinical Measurements: Goal: Ability to maintain clinical measurements within normal limits will improve Outcome: Adequate for Discharge Goal: Will remain free from infection Outcome: Adequate for Discharge Goal: Diagnostic test results will improve Outcome: Adequate for Discharge Goal: Respiratory complications will improve Outcome: Adequate for Discharge Goal: Cardiovascular complication will be avoided Outcome: Adequate for Discharge   Problem: Activity: Goal: Risk for activity intolerance will decrease Outcome: Adequate for Discharge   Problem: Nutrition: Goal: Adequate nutrition will be maintained Outcome: Adequate for Discharge   Problem: Coping: Goal: Level of anxiety will decrease 10/19/2021 1736 by Vella Raring, RN Outcome: Adequate for Discharge 10/19/2021 [phone] by Vella Raring, RN Outcome: Progressing   Problem: Elimination: Goal: Will not experience complications related to bowel motility Outcome: Adequate for Discharge Goal: Will not experience complications related to urinary retention Outcome: Adequate for Discharge   Problem: Pain Managment: Goal: General experience of comfort will improve Outcome: Adequate for Discharge   Problem: Safety: Goal: Ability to remain free from injury will improve Outcome: Adequate for Discharge   Problem: Skin Integrity: Goal: Risk for impaired skin integrity will decrease Outcome: Adequate for Discharge

## 2021-10-20 DIAGNOSIS — K56600 Partial intestinal obstruction, unspecified as to cause: Secondary | ICD-10-CM | POA: Diagnosis not present

## 2021-10-20 NOTE — Progress Notes (Signed)
Pt discharged via WC to POV. Pt and sister both stated understanding of discharge instructions. Pt discharged on room air, pt states he takes it off at home and does fine. SaO2 on 2 lpm [place] 94%. O2 removed for 15 minutes and pt SaO2 rechecked after dressing self, pt denies SOB, resp remained 18/min, SaO2 93%. Pt advised to resume O2 ASAP once home. Pt and sister stated understanding.

## 2021-10-22 ENCOUNTER — Telehealth: Payer: Self-pay | Admitting: Family Medicine

## 2021-10-22 NOTE — Telephone Encounter (Signed)
Left message for patient to call back and schedule Medicare Annual Wellness Visit (AWV) in office.  ° °If not able to come in office, please offer to do virtually or by telephone.  Left office number and my jabber #[phone]. ° °AWVI eligible as of 12/01/2009 ° °Please schedule at anytime with Nurse Health Advisor. °  °

## 2021-10-31 ENCOUNTER — Other Ambulatory Visit: Payer: Self-pay

## 2021-10-31 ENCOUNTER — Encounter: Payer: Self-pay | Admitting: Internal Medicine

## 2021-10-31 ENCOUNTER — Ambulatory Visit (HOSPITAL_COMMUNITY)
Admission: RE | Admit: 2021-10-31 | Discharge: 2021-10-31 | Disposition: A | Discharge status: Home / Self Care | Payer: Medicare HMO | Source: Ambulatory Visit | Attending: Internal Medicine | Admitting: Internal Medicine

## 2021-10-31 ENCOUNTER — Ambulatory Visit (INDEPENDENT_AMBULATORY_CARE_PROVIDER_SITE_OTHER): Payer: Medicare HMO | Admitting: Internal Medicine

## 2021-10-31 DIAGNOSIS — J439 Emphysema, unspecified: Secondary | ICD-10-CM | POA: Diagnosis not present

## 2021-10-31 DIAGNOSIS — J9611 Chronic respiratory failure with hypoxia: Secondary | ICD-10-CM

## 2021-10-31 DIAGNOSIS — R058 Other specified cough: Secondary | ICD-10-CM | POA: Insufficient documentation

## 2021-10-31 DIAGNOSIS — R059 Cough, unspecified: Secondary | ICD-10-CM | POA: Diagnosis not present

## 2021-10-31 DIAGNOSIS — J449 Chronic obstructive pulmonary disease, unspecified: Secondary | ICD-10-CM | POA: Diagnosis not present

## 2021-10-31 DIAGNOSIS — R079 Chest pain, unspecified: Secondary | ICD-10-CM | POA: Diagnosis not present

## 2021-10-31 MED ORDER — FAMOTIDINE 20 MG PO TABS: ORAL_TABLET | ORAL | 11 refills | Status: DC

## 2021-10-31 MED ORDER — PANTOPRAZOLE SODIUM 40 MG PO TBEC: 40.0000 mg | DELAYED_RELEASE_TABLET | Freq: Every day | ORAL | 2 refills | Status: DC

## 2021-10-31 NOTE — Assessment & Plan Note (Signed)
Onset p aecopd admit 09/24/21  -  10/31/2021  rec max gerd rx >>>  Cough timing is more c/w Upper airway cough syndrome (previously labeled PNDS),  is so named because it's frequently impossible to sort out how much is  CR/sinusitis with freq throat clearing (which can be related to primary GERD)   vs  causing  secondary (" extra esophageal")  GERD from wide swings in gastric pressure that occur with throat clearing, often  promoting self use of mint and menthol lozenges that reduce the lower esophageal sphincter tone and exacerbate the problem further in a cyclical fashion.   These are the same pts (now being labeled as having "irritable larynx syndrome" by some cough centers) who not infrequently have a history of having failed to tolerate ace inhibitors,  dry powder inhalers or biphosphonates or report having atypical/extraesophageal reflux symptoms that don't respond to standard doses of PPI  and are easily confused as having aecopd or asthma flares by even experienced allergists/ pulmonologists (myself included).   rec  Start with max gerd rx x 6 weeks trial

## 2021-10-31 NOTE — Progress Notes (Signed)
Michael Evans, male    DOB: 1941-08-01, 80 y.o.   MRN: [id number]   Brief patient profile:  14 yowm quit smoking 2003 with h/o copd GOLD 2  prev rx by Halford Chessman  referred to pulmonary clinic in [place]  10/31/2021 by Dr  Wynetta Emery p admit:    Admit date: 09/24/2021 Discharge date: 09/29/2021        Brief Hospitalization Summary: Please see all hospital notes, images, labs for full details of the hospitalization. ADMISSION HPI: ESEQUIEL KLEINFELTER is a 80 y.o. male with medical history significant of COPD, chronic respiratory failure on 2 L, hypertension, arthritis presented with acute on chronic respiratory failure with hypoxia and COPD exacerbation.  Patient noted to have been seen in the ER back on October 18 for similar symptoms.  Was diagnosed with a COPD exacerbation.  Per report, patient was placed on a Z-Pak.  Symptoms have minimally improved over the course of this treatment.  Unclear if patient got steroids with evaluation.  Per the patient, he has had increased cough, wheezing, mild sputum production.    Patient states that symptoms got worse once the vents were closed in his house.     ED Course: Presented to the ER afebrile, hemodynamically stable.  Requiring 2 to 4 L nasal cannula for oxygenation.  COVID-negative.  Labs notable for K of 5.8.  Resolved after albuterol treatment and Lokelma with a potassium of 3.4.  Troponin and EKG within normal limits.  Chest x-ray within normal limits.    HOSPITAL COURSE by problem list   Acute on chronic respiratory failure with hypoxia secondary to COPD exacerbation -Patient was treated with bronchodilators, IV steroids, antibiotics and now feeling back to his baseline.  DC home with outpatient follow up.  He will discharge home on prednisone taper.  Ambulatory referral to [facility].  He has not seen them in several years.     Severe COPD - Patient has diffuse if for emphysematous changes on x-ray - see above recommendations for acute  exacerbation  -Sputum culture reviewed normal respiratory flora no staph or Pseudomonas noted He will discharge home on prednisone taper and ambulatory referral to [facility] pulmonary clinic.     Essential Hypertension  -His blood pressure is managed with diet only, we are monitoring and it has been stable and controlled   OSA -We offered nightly CPAP in hospital       History of Present Illness  10/31/2021  Pulmonary/ 1st office eval/ Luci Bellucci / [place] on breztri  Chief Complaint  Patient presents with   Consult    2lpm pulse when out. 2lpm cont. At home.  Consult for emphysema and copd diagnosis.  Has seen Dr. Halford Chessman in 2016. Has used oxygen for almost 3 years unsure if put on oxygen by pcp or after hosp d/c.   Dyspnea:  25 ft on 2lpm does not adjust 02  Cough: better now but sometimes 24/7 >mucus usually clear  Sleep: bed is flat/ one pillow SABA use: has ventolin but  not using/ not needing neb  New problem = cp slt to R of midline x 3-4 weeks more positional than pleuritiic   No obvious day to day or daytime variability or assoc  purulent sputum or mucus plugs or hemoptysis or  chest tightness, subjective wheeze or overt sinus or hb symptoms.   Sleeping ok  without nocturnal  or early am exacerbation  of respiratory  c/o's or need for noct saba. Also denies any  obvious fluctuation of symptoms with weather or environmental changes or other aggravating or alleviating factors except as outlined above   No unusual exposure hx or h/o childhood pna/ asthma or knowledge of premature birth.  Current Allergies, Complete Past Medical History, Past Surgical History, Family History, and Social History were reviewed in [employer] record.  ROS  The following are not active complaints unless bolded Hoarseness, sore throat, dysphagia, dental problems, itching, sneezing,  nasal congestion or discharge of excess mucus or purulent secretions, ear ache,   fever,  chills, sweats, unintended wt loss or wt gain, classically pleuritic or exertional cp,  orthopnea pnd or arm/hand swelling  or leg swelling, presyncope, palpitations, abdominal pain, anorexia, nausea, vomiting, diarrhea  or change in bowel habits or change in bladder habits, change in stools or change in urine, dysuria, hematuria,  rash, arthralgias, visual complaints, headache, numbness, weakness or ataxia or problems with walking or coordination,  change in mood or  memory.           Past Medical History:  Diagnosis Date   Arthritis    Chronic rhinitis    COPD (chronic obstructive pulmonary disease) ([place])    PFT 06-26-09 FEV1  1.75( 71%), FVC 3.65( 101%), FEV1% 48, TLC 5.53(104%), DLCO 55%, no BD   Hypertension    Nasal septal deviation    Nocturia    On home O2    2L N/C   OSA (obstructive sleep apnea) 04/21/2011   Skin cancer     Outpatient Medications Prior to Visit  Medication Sig Dispense Refill   albuterol (VENTOLIN HFA) 108 (90 Base) MCG/ACT inhaler Inhale 1-2 puffs into the lungs every 6 (six) hours as needed for wheezing or shortness of breath. 1 each 3   Ascorbic Acid (VITAMIN C) 1000 MG tablet Take 1,000 mg by mouth daily.     Budeson-Glycopyrrol-Formoterol (BREZTRI AEROSPHERE) 160-9-4.8 MCG/ACT AERO Inhale 2 puffs into the lungs in the morning and at bedtime.     cholecalciferol (VITAMIN D) 25 MCG (1000 UNIT) tablet Take 3,000 Units by mouth daily.     cyanocobalamin 2000 MCG tablet Take 1,000 mcg by mouth daily.     Multiple Vitamin (MULTIVITAMIN) tablet Take 1 tablet by mouth daily.     Nebulizer MISC 1 each by Does not apply route daily as needed. PLEASE PROVIDE NEBULIZER KIT TO INCLUDE TUBING/MASK/MOUTHPIECE 1 each 3   Omega-3 Fatty Acids (FISH OIL CONCENTRATE PO) Take 1 capsule by mouth daily.     ondansetron (ZOFRAN ODT) 8 MG disintegrating tablet Take 1 tablet (8 mg total) by mouth every 8 (eight) hours as needed for nausea or vomiting. 20 tablet 0   OXYGEN Inhale 2 L  into the lungs continuous.     pyridoxine (B-6) 100 MG tablet Take 100 mg by mouth daily.     zinc sulfate 220 (50 Zn) MG capsule Take 220 mg by mouth daily.     No facility-administered medications prior to visit.     Objective:     BP 132/70 (BP Location: Left Arm, Patient Position: Sitting)   Pulse 80   Temp 98.2 F (36.8 C) (Temporal)   Ht 5' 5"  (1.651 m)   Wt 136 lb (61.7 kg)   SpO2 96% Comment: 2lpm pulse.  BMI 22.63 kg/m   SpO2: 96 % (2lpm pulse.)  Hoarse amb wm arrived  with sister   HEENT : pt wearing mask not removed for exam due to covid -19 concerns.    NECK :  without JVD/Nodes/TM/ nl carotid upstrokes bilaterally   LUNGS: no acc muscle use,  Mod barrel  contour chest wall with bilateral  Distant bs s audible wheeze and  without cough on insp or exp maneuvers and mod  Hyperresonant  to  percussion bilaterally     CV:  RRR  no s3 or murmur or increase in P2, and no edema   ABD:  soft and nontender with pos mid insp Hoover's  in the supine position. No bruits or organomegaly appreciated, bowel sounds nl  MS:     ext warm without deformities, calf tenderness, cyanosis or clubbing No obvious joint restrictions   SKIN: warm and dry without lesions    NEURO:  alert, approp, nl sensorium with  no motor or cerebellar deficits apparent.         I personally reviewed images and agree with radiology impression as follows:  CXR:   10/31/21  pa and lat Mild / mod copd, no acute changes      Assessment   COPD GOLD 2 Quit smoking 2003  - PFT's  05/14/15 FEV1 1.67 (61 % ) ratio 0.56   p 21 % improvement from saba p ? prior to study with DLCO  10.51 (35%) corrects to 35% 2.4141 (54%)  for alv volume and FV curve only mildly concave  - 10/31/2021  After extensive coaching inhaler device,  effectiveness =    75% from a baseline of 50% > continue breztri and approp saba    Group D in terms of symptom/risk and laba/lama/ICS  therefore appropriate rx at this point >>>   breztri 2 bid  Re SABA :  I spent extra time with pt today reviewing appropriate use of albuterol for prn use on exertion with the following points: 1) saba is for relief of sob that does not improve by walking a slower pace or resting but rather if the pt does not improve after trying this first. 2) If the pt is convinced, as many are, that saba helps recover from activity faster then it's easy to tell if this is the case by re-challenging : ie stop, take the inhaler, then p 5 minutes try the exact same activity (intensity of workload) that just caused the symptoms and see if they are substantially diminished or not after saba 3) if there is an activity that reproducibly causes the symptoms, try the saba 15 min before the activity on alternate days   If in fact the saba really does help, then fine to continue to use it prn but advised may need to look closer at the maintenance regimen being used to achieve better control of airways disease with exertion.    >> needs alpha one AT phenotyping on return     Upper airway cough syndrome Onset p aecopd admit 09/24/21  -  10/31/2021  rec max gerd rx >>>  Cough timing is more c/w Upper airway cough syndrome (previously labeled PNDS),  is so named because it's frequently impossible to sort out how much is  CR/sinusitis with freq throat clearing (which can be related to primary GERD)   vs  causing  secondary (" extra esophageal")  GERD from wide swings in gastric pressure that occur with throat clearing, often  promoting self use of mint and menthol lozenges that reduce the lower esophageal sphincter tone and exacerbate the problem further in a cyclical fashion.   These are the same pts (now being labeled as having "irritable larynx syndrome" by some cough  centers) who not infrequently have a history of having failed to tolerate ace inhibitors,  dry powder inhalers or biphosphonates or report having atypical/extraesophageal reflux symptoms that don't  respond to standard doses of PPI  and are easily confused as having aecopd or asthma flares by even experienced allergists/ pulmonologists (myself included).   rec  Start with max gerd rx x 6 weeks trial   Chronic respiratory failure with hypoxia ([place]) Long term rx s hypercarbia as of 10/31/2021   - 10/31/2021   Walked on 2lpm pulsed x  3  lap(s) =  approx 450 ft @ slow pace, stopped due to end of study s sob with lowest 02 sats 92%  Adequate control on present rx, reviewed in detail with pt > no change in rx needed  / walking study suggests needs to do more paced (submax) ex daily   F/u: 6-8 weeks with all meds in hand using a trust but verify approach to confirm accurate Medication  Reconciliation The principal here is that until we are certain that the  patients are doing what we've asked, it makes no sense to ask them to do more.   Each maintenance medication was reviewed in detail including emphasizing most importantly the difference between maintenance and prns and under what circumstances the prns are to be triggered using an action plan format where appropriate.  Total time for H and P, chart review, counseling, reviewing hfa/neb/02 device(s) , directly observing portions of ambulatory 02 saturation study/ and generating customized AVS unique to this office visit / same day charting  50 min              Christinia Gully, MD 10/31/2021

## 2021-10-31 NOTE — Assessment & Plan Note (Addendum)
Quit smoking 2003  - PFT's  05/14/15 FEV1 1.67 (61 % ) ratio 0.56   p 21 % improvement from saba p ? prior to study with DLCO  10.51 (35%) corrects to 35% 2.4141 (54%)  for alv volume and FV curve only mildly concave  - 10/31/2021  After extensive coaching inhaler device,  effectiveness =    75% from a baseline of 50% > continue breztri and approp saba    Group D in terms of symptom/risk and laba/lama/ICS  therefore appropriate rx at this point >>>  breztri 2 bid  Re SABA :  I spent extra time with pt today reviewing appropriate use of albuterol for prn use on exertion with the following points: 1) saba is for relief of sob that does not improve by walking a slower pace or resting but rather if the pt does not improve after trying this first. 2) If the pt is convinced, as many are, that saba helps recover from activity faster then it's easy to tell if this is the case by re-challenging : ie stop, take the inhaler, then p 5 minutes try the exact same activity (intensity of workload) that just caused the symptoms and see if they are substantially diminished or not after saba 3) if there is an activity that reproducibly causes the symptoms, try the saba 15 min before the activity on alternate days   If in fact the saba really does help, then fine to continue to use it prn but advised may need to look closer at the maintenance regimen being used to achieve better control of airways disease with exertion.    >> needs alpha one AT phenotyping on return

## 2021-10-31 NOTE — Assessment & Plan Note (Addendum)
Long term rx s hypercarbia as of 10/31/2021   - 10/31/2021   Walked on 2lpm pulsed x  3  lap(s) =  approx 450 ft @ slow pace, stopped due to end of study s sob with lowest 02 sats 92%  Adequate control on present rx, reviewed in detail with pt > no change in rx needed  / walking study suggests needs to do more paced (submax) ex daily   F/u: 6-8 weeks with all meds in hand using a trust but verify approach to confirm accurate Medication  Reconciliation The principal here is that until we are certain that the  patients are doing what we've asked, it makes no sense to ask them to do more.   Each maintenance medication was reviewed in detail including emphasizing most importantly the difference between maintenance and prns and under what circumstances the prns are to be triggered using an action plan format where appropriate.  Total time for H and P, chart review, counseling, reviewing hfa/neb/02 device(s) , directly observing portions of ambulatory 02 saturation study/ and generating customized AVS unique to this office visit / same day charting  50 min

## 2021-10-31 NOTE — Patient Instructions (Addendum)
Make sure you check your oxygen saturation  AT  your highest level of activity (not after you stop)   to be sure it stays over 90% and adjust  02 flow upward to maintain this level if needed but remember to turn it back to previous settings when you stop (to conserve your supply).   Plan A = Automatic = Always=   Breztri Take 2 puffs first thing in am and then another 2 puffs about 12 hours later.    Work on inhaler technique:  relax and gently blow all the way out then take a nice smooth full deep breath back in, triggering the inhaler at same time you start breathing in.  Hold for up to 5 seconds if you can. Blow out thru nose. Rinse and gargle with water when done.  If mouth or throat bother you at all,  try brushing teeth/gums/tongue with arm and hammer toothpaste/ make a slurry and gargle and spit out.   Also take Pantoprazole (protonix) 40 mg   Take  30-60 min before first meal of the day and Pepcid (famotidine)  20 mg after supper until return to office - this is the best way to tell whether stomach acid is contributing to your problem.    Plan B = Backup (to supplement plan A, not to replace it) Only use your albuterol inhaler as a rescue medication to be used if you can't catch your breath by resting or doing a relaxed purse lip breathing pattern.  - The less you use it, the better it will work when you need it. - Ok to use the inhaler up to 2 puffs  every 4 hours if you must but call for appointment if use goes up over your usual need - Don't leave home without it !!  (think of it like the spare tire for your car)   Plan C = Crisis (instead of Plan B but only if Plan B stops working) - only use your albuterol nebulizer if you first try Plan B and it fails to help > ok to use the nebulizer up to every 4 hours but if start needing it regularly call for immediate appointment   For cough > mucinex dm 1200 mg every 12 hours  as needed   GERD (REFLUX)  is an extremely common cause of  respiratory symptoms just like yours , many times with no obvious heartburn at all.    It can be treated with medication, but also with lifestyle changes including elevation of the head of your bed (ideally with 6 -8inch blocks under the headboard of your bed),  Smoking cessation, avoidance of late meals, excessive alcohol, and avoid fatty foods, chocolate, peppermint, colas, red wine, and acidic juices such as orange juice.  NO MINT OR MENTHOL PRODUCTS SO NO COUGH DROPS  USE SUGARLESS CANDY INSTEAD (Jolley ranchers or Stover's or Life Savers) or even ice chips will also do - the key is to swallow to prevent all throat clearing. NO OIL BASED VITAMINS - use powdered substitutes.  Avoid fish oil when coughing.   Please remember to go to the  x-ray department at [facility]   for your tests - we will call you with the results when they are available.  Please schedule a follow up office visit in 6 weeks, call sooner if needed with all medications /inhalers/ solutions in hand so we can verify exactly what you are taking. This includes all medications from all doctors and over  the counters - also bring your flutter device   ADD : needs alpha one phenotype on return

## 2021-11-05 ENCOUNTER — Ambulatory Visit: Payer: Medicare HMO | Admitting: General Surgery

## 2021-11-12 ENCOUNTER — Institutional Professional Consult (permissible substitution): Payer: Medicare HMO | Admitting: Internal Medicine

## 2021-11-14 ENCOUNTER — Ambulatory Visit (INDEPENDENT_AMBULATORY_CARE_PROVIDER_SITE_OTHER): Payer: Medicare HMO | Admitting: General Surgery

## 2021-11-14 ENCOUNTER — Encounter: Payer: Self-pay | Admitting: General Surgery

## 2021-11-14 ENCOUNTER — Other Ambulatory Visit: Payer: Self-pay

## 2021-11-14 VITALS — BP 170/71 | HR 78 | Temp 97.8°F | Resp 16 | Ht 65.0 in | Wt 138.0 lb

## 2021-11-14 DIAGNOSIS — K4090 Unilateral inguinal hernia, without obstruction or gangrene, not specified as recurrent: Secondary | ICD-10-CM

## 2021-11-14 NOTE — Patient Instructions (Signed)
Go to hospital with worsening pain or hard mass in groin.  Will send Dr. Melvyn Novas a message about risk with surgery.  Will see you 12/17/2021 after Dr. Melvyn Novas weighs in about surgery. MAKE SURE YOU TELL Dr. Melvyn Novas you might have to have surgery so he reviews those risk.

## 2021-11-14 NOTE — Progress Notes (Signed)
[facility] Note   HPI:  80 y.o. Male presents to clinic for follow-up evaluation of his bilateral inguinal hernias. He had one that was incarcerated on the left side a few weeks ago and had to have it reduced in the ED. He still having issues with the left side. He has finished his steroid and saw Dr. Melvyn Novas. He sees him again in a few weeks.  Review of Systems:  Chronic COPD All other review of systems: otherwise negative   Vital Signs:  BP (!) 170/71    Pulse 78    Temp 97.8 F (36.6 C) (Other (Comment))    Resp 16    Ht 5\' 5"  (1.651 m)    Wt 138 lb (62.6 kg)    SpO2 92%    BMI 22.96 kg/m    Physical Exam:  Physical Exam Vitals reviewed.  Cardiovascular:     Rate and Rhythm: Normal rate.  Pulmonary:     Effort: Pulmonary effort is normal.  Abdominal:     General: There is no distension.     Palpations: Abdomen is soft.     Tenderness: There is abdominal tenderness.     Hernia: A hernia is present. Hernia is present in the left inguinal area and right inguinal area.     Comments: reducible    Assessment:  80 y.o. yo Male with bilateral inguinal hernias. The right is worse than the left but the left had been incarcerated a few weeks ago. He is probably going to end up and need repair.   Plan:  Will see him back after he sees Dr. Melvyn Novas, and I will send Melvyn Novas a message about the risk of surgery given the pulmonary status   Future Appointments  Date Time Provider [place]  11/20/2021 12:00 PM AP-MR 1 [facility]  12/12/2021 10:00 AM Tanda Rockers, MD [facility]-RDS None  12/17/2021 11:30 AM [name], MD RS-RS None      Curlene Labrum, MD [facility] [address] [place], [place] [id number] [phone] (office)

## 2021-11-19 ENCOUNTER — Telehealth: Payer: Self-pay | Admitting: *Deleted

## 2021-11-19 ENCOUNTER — Encounter (HOSPITAL_COMMUNITY): Payer: Self-pay | Admitting: *Deleted

## 2021-11-19 ENCOUNTER — Emergency Department (HOSPITAL_COMMUNITY)
Admission: EM | Admit: 2021-11-19 | Discharge: 2021-11-19 | Disposition: A | Discharge status: Home / Self Care | Payer: Medicare HMO | Attending: Emergency Medicine | Admitting: Emergency Medicine

## 2021-11-19 DIAGNOSIS — Z85828 Personal history of other malignant neoplasm of skin: Secondary | ICD-10-CM | POA: Insufficient documentation

## 2021-11-19 DIAGNOSIS — Z79899 Other long term (current) drug therapy: Secondary | ICD-10-CM | POA: Diagnosis not present

## 2021-11-19 DIAGNOSIS — I10 Essential (primary) hypertension: Secondary | ICD-10-CM | POA: Diagnosis not present

## 2021-11-19 DIAGNOSIS — K4090 Unilateral inguinal hernia, without obstruction or gangrene, not specified as recurrent: Secondary | ICD-10-CM | POA: Diagnosis not present

## 2021-11-19 DIAGNOSIS — Z87891 Personal history of nicotine dependence: Secondary | ICD-10-CM | POA: Insufficient documentation

## 2021-11-19 DIAGNOSIS — J441 Chronic obstructive pulmonary disease with (acute) exacerbation: Secondary | ICD-10-CM | POA: Diagnosis not present

## 2021-11-19 LAB — COMPREHENSIVE METABOLIC PANEL
ALT: 20 U/L (ref 0–44)
AST: 21 U/L (ref 15–41)
Albumin: 4.2 g/dL (ref 3.5–5.0)
Alkaline Phosphatase: 56 U/L (ref 38–126)
Anion gap: 8 (ref 5–15)
BUN: 21 mg/dL (ref 8–23)
CO2: 30 mmol/L (ref 22–32)
Calcium: 10 mg/dL (ref 8.9–10.3)
Chloride: 100 mmol/L (ref 98–111)
Creatinine, Ser: 0.82 mg/dL (ref 0.61–1.24)
GFR, Estimated: >60 mL/min (ref >60)
Glucose, Bld: 133 mg/dL — ABNORMAL HIGH (ref 70–99)
Potassium: 4.3 mmol/L (ref 3.5–5.1)
Sodium: 138 mmol/L (ref 135–145)
Total Bilirubin: 0.6 mg/dL (ref 0.3–1.2)
Total Protein: 7.2 g/dL (ref 6.5–8.1)

## 2021-11-19 LAB — CBC WITH DIFFERENTIAL/PLATELET
Abs Immature Granulocytes: 0.11 10*3/uL — ABNORMAL HIGH (ref 0.00–0.07)
Basophils Absolute: 0 10*3/uL (ref 0.0–0.1)
Basophils Relative: 0 %
Eosinophils Absolute: 0.2 10*3/uL (ref 0.0–0.5)
Eosinophils Relative: 1 %
HCT: 46.2 % (ref 39.0–52.0)
Hemoglobin: 15.4 g/dL (ref 13.0–17.0)
Immature Granulocytes: 1 %
Lymphocytes Relative: 20 %
Lymphs Abs: 2.5 10*3/uL (ref 0.7–4.0)
MCH: 31.1 pg (ref 26.0–34.0)
MCHC: 33.3 g/dL (ref 30.0–36.0)
MCV: 93.3 fL (ref 80.0–100.0)
Monocytes Absolute: 1 10*3/uL (ref 0.1–1.0)
Monocytes Relative: 8 %
Neutro Abs: 8.5 10*3/uL — ABNORMAL HIGH (ref 1.7–7.7)
Neutrophils Relative %: 70 %
Platelets: 242 10*3/uL (ref 150–400)
RBC: 4.95 MIL/uL (ref 4.22–5.81)
RDW: 13.3 % (ref 11.5–15.5)
WBC: 12.3 10*3/uL — ABNORMAL HIGH (ref 4.0–10.5)
nRBC: 0 % (ref 0.0–0.2)

## 2021-11-19 LAB — LIPASE, BLOOD: Lipase: 30 U/L (ref 11–51)

## 2021-11-19 MED ORDER — ACETAMINOPHEN 325 MG PO TABS: 650.0000 mg | ORAL_TABLET | Freq: Once | ORAL | Status: DC

## 2021-11-19 NOTE — Discharge Instructions (Signed)
It was our pleasure to provide your ER care today - we hope that you feel better.  Take acetaminophen as need.   For hernia, follow up with general surgeon in the next few weeks - call office to arrange appointment.   Return to ER if worse, new symptoms, fever, if hernia persistently is 'out', painful not able to be 'reduced', severe or worsening abdominal pain, persistent vomiting, or other concern.

## 2021-11-19 NOTE — ED Provider Notes (Signed)
[facility] EMERGENCY DEPARTMENT Provider Note   CSN: [id number] Arrival date & time: 11/19/21  1648     History Chief Complaint  Patient presents with   Hernia    Michael Evans is a 80 y.o. male.  Patient c/o pain in area left inguinal hernia. States hernia present for 'long time' but seems to be getting bigger  and notes pain to area. Symptoms gradual onset, moderate, constant, persistent. No abd distension. No nausea/vomiting. Is having bms. No back or flank pain. No scrotal or testicular pain. No dysuria, hematuria or gu c/o. No fever or chills.   The history is provided by the patient and medical records.      Past Medical History:  Diagnosis Date   Arthritis    Chronic rhinitis    COPD (chronic obstructive pulmonary disease) ([place])    PFT 06-26-09 FEV1  1.75( 71%), FVC 3.65( 101%), FEV1% 48, TLC 5.53(104%), DLCO 55%, no BD   Hypertension    Nasal septal deviation    Nocturia    On home O2    2L N/C   OSA (obstructive sleep apnea) 04/21/2011   Skin cancer     Patient Active Problem List   Diagnosis Date Noted   Left inguinal hernia 11/14/2021   Upper airway cough syndrome 10/31/2021   Partial small bowel obstruction ([place]) 10/18/2021   Dehydration 10/18/2021   Hypertensive urgency 10/18/2021   Chronic respiratory failure with hypoxia ([place]) 10/18/2021   Abdominal pain 10/18/2021   Bilateral inguinal hernia with obstruction and without gangrene    Right inguinal hernia 10/08/2021   COPD GOLD 2 09/26/2021   Acute on chronic respiratory failure ([place]) 09/24/2021   Tachycardia 09/26/2017   Influenza A 01/24/2016   Acute on chronic respiratory failure with hypoxia ([place]) 01/22/2016   SIRS (systemic inflammatory response syndrome) ([place]) 01/22/2016   Leukocytosis 01/22/2016   Hyperglycemia 01/22/2016   COPD exacerbation ([place]) 02/14/2015   Shingles rash 08/26/2014   Hypoxia 08/26/2014   OSA (obstructive sleep apnea) 04/21/2011   HOARSENESS 10/03/2009   RHINITIS  08/02/2009   COPD with emphysema ([place]) 07/05/2009   Essential hypertension 06/07/2009    Past Surgical History:  Procedure Laterality Date   APPENDECTOMY     BACK SURGERY     CATARACT EXTRACTION W/PHACO  01/06/2013   Procedure: CATARACT EXTRACTION PHACO AND INTRAOCULAR LENS PLACEMENT ([place]);  Surgeon: Tonny Branch, MD;  Location: [facility];  Service: Ophthalmology;  Laterality: Right;  CDE: 22.17       Family History  Problem Relation Age of Onset   Lung cancer Mother     Social History   Tobacco Use   Smoking status: Former    Packs/day: 1.50    Years: 54.00    Pack years: 81.00    Types: Cigarettes    Quit date: 12/01/2001    Years since quitting: 19.9   Smokeless tobacco: Never  Vaping Use   Vaping Use: Never used  Substance Use Topics   Alcohol use: No   Drug use: No    Home Medications Prior to Admission medications   Medication Sig Start Date End Date Taking? Authorizing Provider  albuterol (VENTOLIN HFA) 108 (90 Base) MCG/ACT inhaler Inhale 1-2 puffs into the lungs every 6 (six) hours as needed for wheezing or shortness of breath. 01/01/21   [name], MD  Ascorbic Acid (VITAMIN C) 1000 MG tablet Take 1,000 mg by mouth daily.    [provider]  Budeson-Glycopyrrol-Formoterol (BREZTRI AEROSPHERE) 160-9-4.8 MCG/ACT  AERO Inhale 2 puffs into the lungs in the morning and at bedtime.    [provider]  cholecalciferol (VITAMIN D) 25 MCG (1000 UNIT) tablet Take 3,000 Units by mouth daily.    [provider]  cyanocobalamin 2000 MCG tablet Take 1,000 mcg by mouth daily.    [provider]  Multiple Vitamin (MULTIVITAMIN) tablet Take 1 tablet by mouth daily.    [provider]  Nebulizer MISC 1 each by Does not apply route daily as needed. PLEASE PROVIDE NEBULIZER KIT TO INCLUDE TUBING/MASK/MOUTHPIECE 10/04/21   [name], MD  Omega-3 Fatty Acids (FISH OIL CONCENTRATE PO) Take 1 capsule by mouth daily.    [provider]  ondansetron (ZOFRAN ODT) 8 MG disintegrating tablet Take 1 tablet (8 mg total) by mouth every 8 (eight) hours as needed for nausea or vomiting. 10/19/21   Barton Dubois, MD  OXYGEN Inhale 2 L into the lungs continuous.    [provider]  pyridoxine (B-6) 100 MG tablet Take 100 mg by mouth daily.    [provider]  zinc sulfate 220 (50 Zn) MG capsule Take 220 mg by mouth daily.    [provider]    Allergies    Amlodipine, Aspirin, Other, Penicillins, and Sulfa antibiotics  Review of Systems   Review of Systems  Constitutional:  Negative for fever.  HENT:  Negative for sore throat.   Eyes:  Negative for redness.  Respiratory:  Negative for shortness of breath.   Cardiovascular:  Negative for chest pain.  Gastrointestinal:  Negative for vomiting.  Genitourinary:  Negative for flank pain.  Musculoskeletal:  Negative for back pain.  Skin:  Negative for rash.  Neurological:  Negative for headaches.  Hematological:  Does not bruise/bleed easily.  Psychiatric/Behavioral:  Negative for confusion.    Physical Exam Updated Vital Signs BP (!) 176/80 (BP Location: Right Arm)    Pulse 86    Temp 98 F (36.7 C) (Oral)    Resp 19    SpO2 95%   Physical Exam Vitals and nursing note reviewed.  Constitutional:      Appearance: Normal appearance. He is well-developed.  HENT:     Head: Atraumatic.     Nose: Nose normal.     Mouth/Throat:     Mouth: Mucous membranes are moist.  Eyes:     General: No scleral icterus.    Conjunctiva/sclera: Conjunctivae normal.  Neck:     Trachea: No tracheal deviation.  Cardiovascular:     Rate and Rhythm: Normal rate and regular rhythm.     Pulses: Normal pulses.     Heart sounds: Normal heart sounds. No murmur heard.   No friction rub. No gallop.  Pulmonary:     Effort: Pulmonary effort is normal. No accessory muscle usage or respiratory distress.     Breath sounds: Normal breath sounds.  Abdominal:      General: Bowel sounds are normal. There is no distension.     Palpations: Abdomen is soft.     Tenderness: There is no abdominal tenderness. There is no guarding or rebound.     Hernia: A hernia is present.     Comments: Soft, reducible left inguinal hernia.   Genitourinary:    Comments: No cva tenderness. Musculoskeletal:        General: No swelling.     Cervical back: Normal range of motion and neck supple. No rigidity.  Skin:    General: Skin is warm and dry.  Findings: No rash.  Neurological:     Mental Status: He is alert.     Comments: Alert, speech clear.   Psychiatric:        Mood and Affect: Mood normal.    ED Results / Procedures / Treatments   Labs (all labs ordered are listed, but only abnormal results are displayed) Results for orders placed or performed during the hospital encounter of 11/19/21  CBC with Differential  Result Value Ref Range   WBC 12.3 (H) 4.0 - 10.5 K/uL   RBC 4.95 4.22 - 5.81 MIL/uL   Hemoglobin 15.4 13.0 - 17.0 g/dL   HCT 46.2 39.0 - 52.0 %   MCV 93.3 80.0 - 100.0 fL   MCH 31.1 26.0 - 34.0 pg   MCHC 33.3 30.0 - 36.0 g/dL   RDW 13.3 11.5 - 15.5 %   Platelets 242 150 - 400 K/uL   nRBC 0.0 0.0 - 0.2 %   Neutrophils Relative % 70 %   Neutro Abs 8.5 (H) 1.7 - 7.7 K/uL   Lymphocytes Relative 20 %   Lymphs Abs 2.5 0.7 - 4.0 K/uL   Monocytes Relative 8 %   Monocytes Absolute 1.0 0.1 - 1.0 K/uL   Eosinophils Relative 1 %   Eosinophils Absolute 0.2 0.0 - 0.5 K/uL   Basophils Relative 0 %   Basophils Absolute 0.0 0.0 - 0.1 K/uL   Immature Granulocytes 1 %   Abs Immature Granulocytes 0.11 (H) 0.00 - 0.07 K/uL  Comprehensive metabolic panel  Result Value Ref Range   Sodium 138 135 - 145 mmol/L   Potassium 4.3 3.5 - 5.1 mmol/L   Chloride 100 98 - 111 mmol/L   CO2 30 22 - 32 mmol/L   Glucose, Bld 133 (H) 70 - 99 mg/dL   BUN 21 8 - 23 mg/dL   Creatinine, Ser 0.82 0.61 - 1.24 mg/dL   Calcium 10.0 8.9 - 10.3 mg/dL   Total Protein 7.2 6.5 -  8.1 g/dL   Albumin 4.2 3.5 - 5.0 g/dL   AST 21 15 - 41 U/L   ALT 20 0 - 44 U/L   Alkaline Phosphatase 56 38 - 126 U/L   Total Bilirubin 0.6 0.3 - 1.2 mg/dL   GFR, Estimated >60 >60 mL/min   Anion gap 8 5 - 15  Lipase, blood  Result Value Ref Range   Lipase 30 11 - 51 U/L   DG Chest 2 View  Result Date: 10/31/2021 CLINICAL DATA:  Productive cough, right chest pain, former smoker EXAM: CHEST - 2 VIEW COMPARISON:  09/26/2021 FINDINGS: The heart size and mediastinal contours are within normal limits. Emphysema and pulmonary hyperinflation. Disc degenerative disease of the thoracic spine. IMPRESSION: Emphysema without acute abnormality of the lungs. Electronically Signed   By: Delanna Ahmadi M.D.   On: 10/31/2021 16:43    EKG None  Radiology No results found.  Procedures Procedures   Medications Ordered in ED Medications - No data to display  ED Course  I have reviewed the triage vital signs and the nursing notes.  Pertinent labs & imaging results that were available during my care of the patient were reviewed by me and considered in my medical decision making (see chart for details).    MDM Rules/Calculators/A&P                         Labs sent from triage.  Reviewed nursing notes and prior charts for additional history.  Labs reviewed/interpreted by me-  chem normal. Wbc mildly elevated.   Patients hernia is easily reduced in ED.    Recheck abd soft non tender, no pain.   Pt appears stable for d/c.   Rec outpatient general surgery f/u.  Return precautions provided.      Final Clinical Impression(s) / ED Diagnoses Final diagnoses:  None    Rx / DC Orders ED Discharge Orders     None        Lajean Saver, MD 11/19/21 [phone]

## 2021-11-19 NOTE — ED Provider Notes (Signed)
Emergency Medicine Provider Triage Evaluation Note  Michael Evans , a 80 y.o. male  was evaluated in triage.  Pt complains of left inguinal hernia not reducible since this morning.  Pain around the site, called his doctor who advised to go to ED to evaluate for incarcerated hernia..  Review of Systems  Positive: above Negative: fever  Physical Exam  BP (!) 176/80 (BP Location: Right Arm)    Pulse 86    Temp 98 F (36.7 C) (Oral)    Resp 19    SpO2 95%  Gen:   Awake, no distress   Resp:  Normal effort  MSK:   Moves extremities without difficulty  Other:    Medical Decision Making  Medically screening exam initiated at 5:48 PM.  Appropriate orders placed.  Michael Evans was informed that the remainder of the evaluation will be completed by another provider, this initial triage assessment does not replace that evaluation, and the importance of remaining in the ED until their evaluation is complete.     Sherrill Raring, PA-C 11/19/21 1748    Lajean Saver, MD 11/19/21 [phone]

## 2021-11-19 NOTE — ED Notes (Signed)
Called pt's brother [name]. Per brother, he will be here in 30-40 mins.

## 2021-11-19 NOTE — Telephone Encounter (Signed)
Received call from patient sister, Altha Harm.   Reports that patient has increased pain and pressure in L inguinal site. States that area is swollen and patient reports that area feels as if it is tearing.   Advised to return to ER for possible incarceration of hernia.

## 2021-11-19 NOTE — ED Triage Notes (Signed)
Hernia left lower quadrant

## 2021-11-19 NOTE — ED Notes (Signed)
Pt wanted this nurse to call his brother [name] to pick him up/ dc

## 2021-11-20 ENCOUNTER — Ambulatory Visit (HOSPITAL_COMMUNITY)
Admission: RE | Admit: 2021-11-20 | Discharge: 2021-11-20 | Disposition: A | Discharge status: Home / Self Care | Payer: Medicare HMO | Source: Ambulatory Visit | Attending: Family Medicine | Admitting: Family Medicine

## 2021-11-20 ENCOUNTER — Other Ambulatory Visit: Payer: Self-pay

## 2021-11-20 DIAGNOSIS — R410 Disorientation, unspecified: Secondary | ICD-10-CM | POA: Diagnosis not present

## 2021-11-20 DIAGNOSIS — R4182 Altered mental status, unspecified: Secondary | ICD-10-CM | POA: Diagnosis not present

## 2021-11-20 MED ORDER — GADOBUTROL 1 MMOL/ML IV SOLN
6.0000 mL | Freq: Once | INTRAVENOUS | Status: AC | PRN
  Administered 2021-11-20: 12:00:00 6 mL via INTRAVENOUS

## 2021-11-21 ENCOUNTER — Encounter: Payer: Self-pay | Admitting: General Surgery

## 2021-11-21 ENCOUNTER — Ambulatory Visit (INDEPENDENT_AMBULATORY_CARE_PROVIDER_SITE_OTHER): Payer: Medicare HMO | Admitting: General Surgery

## 2021-11-21 VITALS — BP 154/81 | HR 80 | Temp 98.5°F | Resp 18 | Ht 65.0 in | Wt 142.0 lb

## 2021-11-21 DIAGNOSIS — K4090 Unilateral inguinal hernia, without obstruction or gangrene, not specified as recurrent: Secondary | ICD-10-CM | POA: Diagnosis not present

## 2021-11-21 NOTE — Patient Instructions (Signed)
Open Hernia Repair, Adult °Open hernia repair is a surgical procedure to fix a hernia. A hernia occurs when an internal organ or tissue pushes through a weak spot in the muscles along the wall of the abdomen. Hernias commonly occur in the groin and around the belly button. °Most hernias tend to get worse over time. Often, surgery is done to prevent the hernia from becoming bigger, uncomfortable, or an emergency. Emergency surgery may be needed if contents of the abdomen get stuck in the opening (incarcerated hernia) or if the blood supply gets cut off (strangulated hernia). In an open repair, an incision is made in the abdomen to perform the surgery. °Tell a health care provider about: °Any allergies you have. °All medicines you are taking, including vitamins, herbs, eye drops, creams, and over-the-counter medicines. °Any problems you or family members have had with anesthetic medicines. °Any blood or bone disorders you have. °Any surgeries you have had. °Any medical conditions you have, including any recent cold or flu (influenza)symptoms. °Whether you are pregnant or may be pregnant. °What are the risks? °Generally, this is a safe procedure. However, problems may occur, including: °Long-lasting (chronic) pain. °Bleeding. °Infection. °Damage to the testicles. This can cause shrinking or swelling. °Damage to nearby structures or organs, including the bladder, blood vessels, intestines, or nerves near the hernia. °Blood clots. °Trouble passing urine. °Return of the hernia. °What happens before the procedure? °Medicines °Ask your health care provider about: °Changing or stopping your regular medicines. This is especially important if you are taking diabetes medicines or blood thinners. °Taking medicines such as aspirin and ibuprofen. These medicines can thin your blood. Do not take these medicines unless your health care provider tells you to take them. °Taking over-the-counter medicines, vitamins, herbs, and  supplements. °Surgery safety °Ask your health care provider: °How your surgery site will be marked. °What steps will be taken to help prevent infection. These steps may include: °Removing hair at the surgery site. °Washing skin with a germ-killing soap. °Receiving antibiotic medicine. °General instructions °You may have an exam or testing, such as blood tests or imaging studies. °Do not use any products that contain nicotine or tobacco for at least 4 weeks before the procedure. These products include cigarettes, chewing tobacco, and vaping devices, such as e-cigarettes. If you need help quitting, ask your health care provider. °Let your health care provider know if you develop a cold or any infection before your surgery. If you get an infection before surgery, you may receive antibiotics to treat it. °Plan to have a responsible adult take you home from the hospital or clinic. °If you will be going home right after the procedure, plan to have a responsible adult care for you for the time you are told. This is important. °What happens during the procedure? ° °An IV will be inserted into one of your veins. °You will be given one or more of the following: °A medicine to help you relax (sedative). °A medicine to numb the area (local anesthetic). °A medicine to make you fall asleep (general anesthetic). °Your surgeon will make an incision over the hernia. °The tissues of the hernia will be moved back into place. °The edges of the hernia may be stitched (sutured) together. °The opening in the abdominal muscles will be closed with stitches (sutures). Or, your surgeon will place a mesh patch made of artificial (synthetic) material over the opening. °The incision will be closed with sutures, skin glue, or adhesive strips. °A bandage (dressing) may be   placed over the incision. °The procedure may vary among health care providers and hospitals. °What happens after the procedure? °Your blood pressure, heart rate, breathing rate,  and blood oxygen level will be monitored until you leave the hospital or clinic. °You may be given medicine for pain. °If you were given a sedative during the procedure, it can affect you for several hours. Do not drive or operate machinery until your health care provider says that it is safe. °Summary °Open hernia repair is a surgical procedure to fix a hernia. Hernias commonly occur in the groin and around the belly button. °Emergency surgery may be needed if contents of the abdomen get stuck in the opening (incarcerated hernia) or if the blood supply gets cut off (strangulated hernia). °In this procedure, an incision is made in the abdomen to perform the surgery. °After the procedure, you may be given medicine for pain. °This information is not intended to replace advice given to you by your health care provider. Make sure you discuss any questions you have with your health care provider. °Document Revised: 07/02/2020 Document Reviewed: 07/02/2020 °[employer]vier Patient Education © 2022 [employer] ° °

## 2021-11-21 NOTE — Progress Notes (Signed)
[facility] History and Physical  Reason for Referral: Left inguinal hernia with pain    Chief Complaint   Hernia     Michael Evans is a 80 y.o. male.  HPI: Michael Evans is known to me and is coming back after having worsening pain in the left groin. He has known bilateral inguinal hernias ans saw me in November. He then  came to the ED with an incarcerated left inguinal hernia and this has caused him issues since that time. He has been off his steroids now and has seen Michael Evans. Discussed with him that e really need to get Michael Evans to weigh in on his risk of surgery give his pulmonary status.  He is on chronic O2 but is otherwise stable. .he has no cardiac symptoms.    Past Medical History:  Diagnosis Date   Arthritis    Chronic rhinitis    COPD (chronic obstructive pulmonary disease) ([place])    PFT 06-26-09 FEV1  1.75( 71%), FVC 3.65( 101%), FEV1% 48, TLC 5.53(104%), DLCO 55%, no BD   Hypertension    Nasal septal deviation    Nocturia    On home O2    2L N/C   OSA (obstructive sleep apnea) 04/21/2011   Skin cancer     Past Surgical History:  Procedure Laterality Date   APPENDECTOMY     BACK SURGERY     CATARACT EXTRACTION W/PHACO  01/06/2013   Procedure: CATARACT EXTRACTION PHACO AND INTRAOCULAR LENS PLACEMENT ([place]);  Surgeon: Michael Branch, MD;  Location: [facility];  Service: Ophthalmology;  Laterality: Right;  CDE: 22.17    Family History  Problem Relation Age of Onset   Lung cancer Mother     Social History   Tobacco Use   Smoking status: Former    Packs/day: 1.50    Years: 54.00    Pack years: 81.00    Types: Cigarettes    Quit date: 12/01/2001    Years since quitting: 19.9   Smokeless tobacco: Never  Vaping Use   Vaping Use: Never used  Substance Use Topics   Alcohol use: No   Drug use: No    Medications: I have reviewed the patient's current medications. Allergies as of 11/21/2021       Reactions   Amlodipine Swelling   Swelling in feet    Aspirin Other (See Comments)   Upset stomach.   Other Hypertension   Hazelnuts   Penicillins Hives   Has patient had a PCN reaction causing immediate rash, facial/tongue/throat swelling, SOB or lightheadedness with hypotension: Yes Has patient had a PCN reaction causing severe rash involving mucus membranes or skin necrosis: No Has patient had a PCN reaction that required hospitalization No Has patient had a PCN reaction occurring within the last 10 years: No If all of the above answers are "NO", then may proceed with Cephalosporin use.   Sulfa Antibiotics Hives        Medication List        Accurate as of November 21, 2021  2:23 PM. If you have any questions, ask your nurse or doctor.          albuterol 108 (90 Base) MCG/ACT inhaler Commonly known as: VENTOLIN HFA Inhale 1-2 puffs into the lungs every 6 (six) hours as needed for wheezing or shortness of breath.   Breztri Aerosphere 160-9-4.8 MCG/ACT Aero Generic drug: Budeson-Glycopyrrol-Formoterol Inhale 2 puffs into the lungs in the morning and at bedtime.   cholecalciferol 25 MCG (  1000 UNIT) tablet Commonly known as: VITAMIN D Take 3,000 Units by mouth daily.   cyanocobalamin 2000 MCG tablet Take 1,000 mcg by mouth daily.   FISH OIL CONCENTRATE PO Take 1 capsule by mouth daily.   multivitamin tablet Take 1 tablet by mouth daily.   Nebulizer Misc 1 each by Does not apply route daily as needed. PLEASE PROVIDE NEBULIZER KIT TO INCLUDE TUBING/MASK/MOUTHPIECE   ondansetron 8 MG disintegrating tablet Commonly known as: Zofran ODT Take 1 tablet (8 mg total) by mouth every 8 (eight) hours as needed for nausea or vomiting.   OXYGEN Inhale 2 L into the lungs continuous.   pyridoxine 100 MG tablet Commonly known as: B-6 Take 100 mg by mouth daily.   vitamin C 1000 MG tablet Take 1,000 mg by mouth daily.   zinc sulfate 220 (50 Zn) MG capsule Take 220 mg by mouth daily.         ROS:  A comprehensive  review of systems was negative except for: Respiratory: positive for wheezing and chronic O2 use Gastrointestinal: positive for abdominal pain and left groin pain  Blood pressure (!) 154/81, pulse 80, temperature 98.5 F (36.9 C), temperature source Other (Comment), resp. rate 18, height _0  (1.651 m), weight 142 lb (64.4 kg), SpO2 91 %. Physical Exam Vitals reviewed.  Constitutional:      Appearance: Normal appearance.  HENT:     Head: Normocephalic.     Mouth/Throat:     Mouth: Mucous membranes are moist.  Eyes:     Extraocular Movements: Extraocular movements intact.  Cardiovascular:     Rate and Rhythm: Normal rate and regular rhythm.  Pulmonary:     Effort: Pulmonary effort is normal.     Breath sounds: Normal breath sounds.  Abdominal:     General: There is no distension.     Palpations: Abdomen is soft.     Tenderness: There is no abdominal tenderness.     Comments: Bilateral inguinal hernia, reducible but tender on the left and difficult to reduce  Musculoskeletal:        General: Normal range of motion.     Cervical back: Normal range of motion.  Skin:    General: Skin is warm.  Neurological:     General: No focal deficit present.     Mental Status: He is alert and oriented to person, place, and time.  Psychiatric:        Mood and Affect: Mood normal.        Behavior: Behavior normal.    Results: Results for orders placed or performed during the hospital encounter of 11/19/21 (from the past 48 hour(s))  CBC with Differential     Status: Abnormal   Collection Time: 11/19/21  5:59 PM  Result Value Ref Range   WBC 12.3 (H) 4.0 - 10.5 K/uL   RBC 4.95 4.22 - 5.81 MIL/uL   Hemoglobin 15.4 13.0 - 17.0 g/dL   HCT 46.2 39.0 - 52.0 %   MCV 93.3 80.0 - 100.0 fL   MCH 31.1 26.0 - 34.0 pg   MCHC 33.3 30.0 - 36.0 g/dL   RDW 13.3 11.5 - 15.5 %   Platelets 242 150 - 400 K/uL   nRBC 0.0 0.0 - 0.2 %   Neutrophils Relative % 70 %   Neutro Abs 8.5 (H) 1.7 - 7.7 K/uL    Lymphocytes Relative 20 %   Lymphs Abs 2.5 0.7 - 4.0 K/uL   Monocytes Relative 8 %  Monocytes Absolute 1.0 0.1 - 1.0 K/uL   Eosinophils Relative 1 %   Eosinophils Absolute 0.2 0.0 - 0.5 K/uL   Basophils Relative 0 %   Basophils Absolute 0.0 0.0 - 0.1 K/uL   Immature Granulocytes 1 %   Abs Immature Granulocytes 0.11 (H) 0.00 - 0.07 K/uL    Comment: Performed at [facility], [address] [place], [place] [address]  Comprehensive metabolic panel     Status: Abnormal   Collection Time: 11/19/21  5:59 PM  Result Value Ref Range   Sodium 138 135 - 145 mmol/L   Potassium 4.3 3.5 - 5.1 mmol/L   Chloride 100 98 - 111 mmol/L   CO2 30 22 - 32 mmol/L   Glucose, Bld 133 (H) 70 - 99 mg/dL    Comment: Glucose reference range applies only to samples taken after fasting for at least 8 hours.   BUN 21 8 - 23 mg/dL   Creatinine, Ser 0.82 0.61 - 1.24 mg/dL   Calcium 10.0 8.9 - 10.3 mg/dL   Total Protein 7.2 6.5 - 8.1 g/dL   Albumin 4.2 3.5 - 5.0 g/dL   AST 21 15 - 41 U/L   ALT 20 0 - 44 U/L   Alkaline Phosphatase 56 38 - 126 U/L   Total Bilirubin 0.6 0.3 - 1.2 mg/dL   GFR, Estimated >60 >60 mL/min    Comment: (NOTE) Calculated using the CKD-EPI Creatinine Equation (2021)    Anion gap 8 5 - 15    Comment: Performed at [facility], [address], [place], [place] [address]  Lipase, blood     Status: None   Collection Time: 11/19/21  5:59 PM  Result Value Ref Range   Lipase 30 11 - 51 U/L    Comment: Performed at [facility], [address], [place],  [address]    MR Brain W Wo Contrast  Result Date: 11/21/2021 CLINICAL DATA:  Mental status change, unknown cause EXAM: MRI HEAD WITHOUT AND WITH CONTRAST TECHNIQUE: Multiplanar, multiecho pulse sequences of the brain and surrounding structures were obtained without and with intravenous contrast. CONTRAST:  20m GADAVIST GADOBUTROL 1 MMOL/ML IV SOLN COMPARISON:  2013 FINDINGS: Brain: There is no acute infarction or intracranial  hemorrhage. There is no intracranial mass, mass effect, or edema. There is no hydrocephalus or extra-axial fluid collection. Prominence of the ventricles and sulci reflects parenchymal volume loss. Patchy foci of T2 hyperintensity the supratentorial white matter are nonspecific but may reflect mild chronic microvascular ischemic changes. There are chronic small vessel infarcts and prominent perivascular spaces of the central white matter and deep gray nuclei. No abnormal enhancement. Vascular: Major vessel flow voids at the skull base are preserved. Skull and upper cervical spine: Normal marrow signal is preserved. Sinuses/Orbits: Minor mucosal thickening.  Right lens replacement. Other: Sella is partially empty.  Mastoid air cells are clear. IMPRESSION: No evidence of recent infarction, hemorrhage, or mass. No abnormal enhancement. Chronic microvascular ischemic changes. Chronic small vessel infarcts. Electronically Signed   By: PMacy MisM.D.   On: 11/21/2021 08:37     Assessment & Plan:  JOCEAN SCHILDTis a 80y.o. male with known inguinal hernias. The left side is causing him significant discomfort and is getting intermittently incarcerated.  He is off steroids now and really needs to get this hernia repaired. Discussed that the option of laparoscopic repair to fix both but given his pulmonary issues, I think that a laparoscopic repair would likely be more dangerous.  Discussed that he really needs the left hernia fixed first given the pain and continued incarceration.  ° °We have gotten in an appt urgently with Pulmonary for them to weigh in on his risk of general anesthesia/ intubation and plans for post operative care. I do not think he is going to be able to avoid surgery and we need to make it the safest possible.  ° °Discussed risk of bleeding, infection, nerve injury, recurrence use of mesh, cord injury, issues with his pulmonary status, and admission post operatively. ° °He is seeing Pulmonary  12/30 and we have him scheduled for 1/9 (Monday) so he can be followed by pulmonary post op.  He knows reasons to go to the ED if incarcerated or worsening pain.  °I have sent Ms. Tammy Parrett, NP and Dr. Wert a message about the above.  ° °All questions were answered to the satisfaction of the patient. ° °Future Appointments  °Date Time Provider Department Center  °11/29/2021  9:00 AM Parrett, Tammy S, NP [facility] None  °12/06/2021  9:15 AM [facility] PAT 2 [facility] None  °12/06/2021  9:30 AM [facility] PAT 2 [facility] None  °12/17/2021 11:30 AM Marvette Schamp C, MD [facility] None  ° ° ° °Ralph Brouwer C Karey Suthers °11/21/2021, 2:23 PM  °

## 2021-11-22 NOTE — H&P (Signed)
[facility] History and Physical  Reason for Referral: Left inguinal hernia with pain    Chief Complaint   Hernia     Michael Evans is a 80 y.o. male.  HPI: [name] is known to me and is coming back after having worsening pain in the left groin. He has known bilateral inguinal hernias ans saw me in November. He then  came to the ED with an incarcerated left inguinal hernia and this has caused him issues since that time. He has been off his steroids now and has seen Dr. Melvyn Novas. Discussed with him that e really need to get Dr. Melvyn Novas to weigh in on his risk of surgery give his pulmonary status.  He is on chronic O2 but is otherwise stable. .he has no cardiac symptoms.    Past Medical History:  Diagnosis Date   Arthritis    Chronic rhinitis    COPD (chronic obstructive pulmonary disease) ([place])    PFT 06-26-09 FEV1  1.75( 71%), FVC 3.65( 101%), FEV1% 48, TLC 5.53(104%), DLCO 55%, no BD   Hypertension    Nasal septal deviation    Nocturia    On home O2    2L N/C   OSA (obstructive sleep apnea) 04/21/2011   Skin cancer     Past Surgical History:  Procedure Laterality Date   APPENDECTOMY     BACK SURGERY     CATARACT EXTRACTION W/PHACO  01/06/2013   Procedure: CATARACT EXTRACTION PHACO AND INTRAOCULAR LENS PLACEMENT ([place]);  Surgeon: Tonny Branch, MD;  Location: [facility];  Service: Ophthalmology;  Laterality: Right;  CDE: 22.17    Family History  Problem Relation Age of Onset   Lung cancer Mother     Social History   Tobacco Use   Smoking status: Former    Packs/day: 1.50    Years: 54.00    Pack years: 81.00    Types: Cigarettes    Quit date: 12/01/2001    Years since quitting: 19.9   Smokeless tobacco: Never  Vaping Use   Vaping Use: Never used  Substance Use Topics   Alcohol use: No   Drug use: No    Medications: I have reviewed the patient's current medications. Allergies as of 11/21/2021       Reactions   Amlodipine Swelling   Swelling in feet    Aspirin Other (See Comments)   Upset stomach.   Other Hypertension   Hazelnuts   Penicillins Hives   Has patient had a PCN reaction causing immediate rash, facial/tongue/throat swelling, SOB or lightheadedness with hypotension: Yes Has patient had a PCN reaction causing severe rash involving mucus membranes or skin necrosis: No Has patient had a PCN reaction that required hospitalization No Has patient had a PCN reaction occurring within the last 10 years: No If all of the above answers are "NO", then may proceed with Cephalosporin use.   Sulfa Antibiotics Hives        Medication List        Accurate as of November 21, 2021  2:23 PM. If you have any questions, ask your nurse or doctor.          albuterol 108 (90 Base) MCG/ACT inhaler Commonly known as: VENTOLIN HFA Inhale 1-2 puffs into the lungs every 6 (six) hours as needed for wheezing or shortness of breath.   Breztri Aerosphere 160-9-4.8 MCG/ACT Aero Generic drug: Budeson-Glycopyrrol-Formoterol Inhale 2 puffs into the lungs in the morning and at bedtime.   cholecalciferol 25 MCG (  1000 UNIT) tablet Commonly known as: VITAMIN D Take 3,000 Units by mouth daily.   cyanocobalamin 2000 MCG tablet Take 1,000 mcg by mouth daily.   FISH OIL CONCENTRATE PO Take 1 capsule by mouth daily.   multivitamin tablet Take 1 tablet by mouth daily.   Nebulizer Misc 1 each by Does not apply route daily as needed. PLEASE PROVIDE NEBULIZER KIT TO INCLUDE TUBING/MASK/MOUTHPIECE   ondansetron 8 MG disintegrating tablet Commonly known as: Zofran ODT Take 1 tablet (8 mg total) by mouth every 8 (eight) hours as needed for nausea or vomiting.   OXYGEN Inhale 2 L into the lungs continuous.   pyridoxine 100 MG tablet Commonly known as: B-6 Take 100 mg by mouth daily.   vitamin C 1000 MG tablet Take 1,000 mg by mouth daily.   zinc sulfate 220 (50 Zn) MG capsule Take 220 mg by mouth daily.         ROS:  A comprehensive  review of systems was negative except for: Respiratory: positive for wheezing and chronic O2 use Gastrointestinal: positive for abdominal pain and left groin pain  Blood pressure (!) 154/81, pulse 80, temperature 98.5 F (36.9 C), temperature source Other (Comment), resp. rate 18, height _0  (1.651 m), weight 142 lb (64.4 kg), SpO2 91 %. Physical Exam Vitals reviewed.  Constitutional:      Appearance: Normal appearance.  HENT:     Head: Normocephalic.     Mouth/Throat:     Mouth: Mucous membranes are moist.  Eyes:     Extraocular Movements: Extraocular movements intact.  Cardiovascular:     Rate and Rhythm: Normal rate and regular rhythm.  Pulmonary:     Effort: Pulmonary effort is normal.     Breath sounds: Normal breath sounds.  Abdominal:     General: There is no distension.     Palpations: Abdomen is soft.     Tenderness: There is no abdominal tenderness.     Comments: Bilateral inguinal hernia, reducible but tender on the left and difficult to reduce  Musculoskeletal:        General: Normal range of motion.     Cervical back: Normal range of motion.  Skin:    General: Skin is warm.  Neurological:     General: No focal deficit present.     Mental Status: He is alert and oriented to person, place, and time.  Psychiatric:        Mood and Affect: Mood normal.        Behavior: Behavior normal.    Results: Results for orders placed or performed during the hospital encounter of 11/19/21 (from the past 48 hour(s))  CBC with Differential     Status: Abnormal   Collection Time: 11/19/21  5:59 PM  Result Value Ref Range   WBC 12.3 (H) 4.0 - 10.5 K/uL   RBC 4.95 4.22 - 5.81 MIL/uL   Hemoglobin 15.4 13.0 - 17.0 g/dL   HCT 46.2 39.0 - 52.0 %   MCV 93.3 80.0 - 100.0 fL   MCH 31.1 26.0 - 34.0 pg   MCHC 33.3 30.0 - 36.0 g/dL   RDW 13.3 11.5 - 15.5 %   Platelets 242 150 - 400 K/uL   nRBC 0.0 0.0 - 0.2 %   Neutrophils Relative % 70 %   Neutro Abs 8.5 (H) 1.7 - 7.7 K/uL    Lymphocytes Relative 20 %   Lymphs Abs 2.5 0.7 - 4.0 K/uL   Monocytes Relative 8 %  Monocytes Absolute 1.0 0.1 - 1.0 K/uL   Eosinophils Relative 1 %   Eosinophils Absolute 0.2 0.0 - 0.5 K/uL   Basophils Relative 0 %   Basophils Absolute 0.0 0.0 - 0.1 K/uL   Immature Granulocytes 1 %   Abs Immature Granulocytes 0.11 (H) 0.00 - 0.07 K/uL    Comment: Performed at [facility], [address] [place], [place] [address]  Comprehensive metabolic panel     Status: Abnormal   Collection Time: 11/19/21  5:59 PM  Result Value Ref Range   Sodium 138 135 - 145 mmol/L   Potassium 4.3 3.5 - 5.1 mmol/L   Chloride 100 98 - 111 mmol/L   CO2 30 22 - 32 mmol/L   Glucose, Bld 133 (H) 70 - 99 mg/dL    Comment: Glucose reference range applies only to samples taken after fasting for at least 8 hours.   BUN 21 8 - 23 mg/dL   Creatinine, Ser 0.82 0.61 - 1.24 mg/dL   Calcium 10.0 8.9 - 10.3 mg/dL   Total Protein 7.2 6.5 - 8.1 g/dL   Albumin 4.2 3.5 - 5.0 g/dL   AST 21 15 - 41 U/L   ALT 20 0 - 44 U/L   Alkaline Phosphatase 56 38 - 126 U/L   Total Bilirubin 0.6 0.3 - 1.2 mg/dL   GFR, Estimated >60 >60 mL/min    Comment: (NOTE) Calculated using the CKD-EPI Creatinine Equation (2021)    Anion gap 8 5 - 15    Comment: Performed at [facility], [address], [place], [place] [address]  Lipase, blood     Status: None   Collection Time: 11/19/21  5:59 PM  Result Value Ref Range   Lipase 30 11 - 51 U/L    Comment: Performed at [facility], [address], [place],  [address]    MR Brain W Wo Contrast  Result Date: 11/21/2021 CLINICAL DATA:  Mental status change, unknown cause EXAM: MRI HEAD WITHOUT AND WITH CONTRAST TECHNIQUE: Multiplanar, multiecho pulse sequences of the brain and surrounding structures were obtained without and with intravenous contrast. CONTRAST:  20m GADAVIST GADOBUTROL 1 MMOL/ML IV SOLN COMPARISON:  2013 FINDINGS: Brain: There is no acute infarction or intracranial  hemorrhage. There is no intracranial mass, mass effect, or edema. There is no hydrocephalus or extra-axial fluid collection. Prominence of the ventricles and sulci reflects parenchymal volume loss. Patchy foci of T2 hyperintensity the supratentorial white matter are nonspecific but may reflect mild chronic microvascular ischemic changes. There are chronic small vessel infarcts and prominent perivascular spaces of the central white matter and deep gray nuclei. No abnormal enhancement. Vascular: Major vessel flow voids at the skull base are preserved. Skull and upper cervical spine: Normal marrow signal is preserved. Sinuses/Orbits: Minor mucosal thickening.  Right lens replacement. Other: Sella is partially empty.  Mastoid air cells are clear. IMPRESSION: No evidence of recent infarction, hemorrhage, or mass. No abnormal enhancement. Chronic microvascular ischemic changes. Chronic small vessel infarcts. Electronically Signed   By: PMacy MisM.D.   On: 11/21/2021 08:37     Assessment & Plan:  JOCEAN SCHILDTis a 80y.o. male with known inguinal hernias. The left side is causing him significant discomfort and is getting intermittently incarcerated.  He is off steroids now and really needs to get this hernia repaired. Discussed that the option of laparoscopic repair to fix both but given his pulmonary issues, I think that a laparoscopic repair would likely be more dangerous.  Discussed that he really needs the left hernia fixed first given the pain and continued incarceration.   We have gotten in an appt urgently with Pulmonary for them to weigh in on his risk of general anesthesia/ intubation and plans for post operative care. I do not think he is going to be able to avoid surgery and we need to make it the safest possible.   Discussed risk of bleeding, infection, nerve injury, recurrence use of mesh, cord injury, issues with his pulmonary status, and admission post operatively.  He is seeing Pulmonary  12/30 and we have him scheduled for 1/9 (Monday) so he can be followed by pulmonary post op.  He knows reasons to go to the ED if incarcerated or worsening pain.  I have sent Ms. Rexene Edison, NP and Dr. Melvyn Novas a message about the above.   All questions were answered to the satisfaction of the patient.  Future Appointments  Date Time Provider [place]  11/29/2021  9:00 AM Parrett, Fonnie Mu, NP [facility] None  12/06/2021  9:15 AM [facility] PAT 2 [facility] None  12/06/2021  9:30 AM [facility] PAT 2 [facility] None  12/17/2021 11:30 AM [name], MD [facility] None     [name] 11/21/2021, 2:23 PM

## 2021-11-29 ENCOUNTER — Encounter: Payer: Self-pay | Admitting: Adult Health

## 2021-11-29 ENCOUNTER — Other Ambulatory Visit: Payer: Self-pay

## 2021-11-29 ENCOUNTER — Ambulatory Visit (INDEPENDENT_AMBULATORY_CARE_PROVIDER_SITE_OTHER): Payer: Medicare HMO | Admitting: Adult Health

## 2021-11-29 VITALS — BP 132/60 | HR 84 | Temp 97.9°F | Ht 65.0 in | Wt 141.2 lb

## 2021-11-29 DIAGNOSIS — Z01811 Encounter for preprocedural respiratory examination: Secondary | ICD-10-CM | POA: Insufficient documentation

## 2021-11-29 DIAGNOSIS — J449 Chronic obstructive pulmonary disease, unspecified: Secondary | ICD-10-CM | POA: Diagnosis not present

## 2021-11-29 DIAGNOSIS — Z23 Encounter for immunization: Secondary | ICD-10-CM | POA: Diagnosis not present

## 2021-11-29 DIAGNOSIS — J9611 Chronic respiratory failure with hypoxia: Secondary | ICD-10-CM

## 2021-11-29 NOTE — Patient Instructions (Addendum)
Continue on BREZTRI 2 puffs Twice daily , rinse after use.  Albuterol inhaler or neb every 6hr as needed.  Mucinex DM Twice daily  As needed  cough/congestion  Restart Flutter valve Twice daily  .  Continue on Oxygen 2l/m .  Activity as tolerated.  Lab today .  Follow up with Dr. Melvyn Novas  in 2 months and As needed   Flu shot today

## 2021-11-29 NOTE — Progress Notes (Signed)
$'@Patient'D$  ID: Michael Evans, male    DOB: March 17, 1941, 80 y.o.   MRN: [id number]  Chief Complaint  Patient presents with   surgical clearance    Referring provider: [name], MD  HPI: 80 year old male former smoker seen for pulmonary consult October 31, 2021 for COPD and chronic respiratory failure on oxygen at 2 L. Medical history significant for sleep apnea intolerant to CPAP  TEST/EVENTS :  10/31/2021   Walked on 2lpm pulsed x  3  lap(s) =  approx 450 ft @ slow pace, stopped due to end of study s sob with lowest 02 sats 92%    PFT's  05/14/15 FEV1 1.67 (61 % ) ratio 0.56   p 21 % improvement from saba p ? prior to study with DLCO  10.51 (35%) corrects to 35% 2.4141 (54%)  for alv volume and FV curve only mildly concave   PSG 05/20/11 >> AHI 8, SpO2 low 87%, PLMI 22 PFT 06/26/09 >> FEV1 1.75(71%), FEV1% 48, TLC 5.53(104%), DLCO 55%, no BD Spirometry 06/14/13 >> FEV1 1.79 (66%), FEV1% 55 CT chest 04/05/15 >> atherosclerosis, severe centrilobular emphysema, ATX  11/29/2021 Follow up : COPD , O2 RF and PreOp risk assessment  Patient returns for 4-week follow-up.  Patient has underlying moderate to severe COPD and oxygen dependent respiratory failure.  Patient was seen last visit for a pulmonary consult and post hospital follow-up.  Patient recently been admitted with a COPD exacerbation.  He had previously been seen at our pulmonary clinic in 2016 for COPD and obstructive sleep apnea that was intolerant to CPAP.  Patient has been on oxygen for greater than 3 years at 2 L continuous flow at home. Last visit patient was changed to [place] twice daily.  Continued on oxygen at 2 L. Uses albuterol inhaler 2-3 times a day on regular basis. Has daily cough with cream mucus. Flutter valve broke .  Patient says overall he is doing okay , feels he is at his baseline.  Spirometry today shows FEV1 at 53%, ratio 49, FVC 75%  Patient is scheduled to undergo hernia repair next month.  He is here  for a pulmonary risk assessment. Lives with sister. Drives locally. Walks short distances . Able to do ADLs. No dyspnea at rest. Gets winded with activities. Remains active , does light yard work. Has POC O2 device to do activities outside home. Appetite is good. No n/v/d. No weight loss.    Allergies  Allergen Reactions   Amlodipine Swelling    Swelling in feet   Aspirin Other (See Comments)    Upset stomach.   Other Hypertension    Hazelnuts   Penicillins Hives    Has patient had a PCN reaction causing immediate rash, facial/tongue/throat swelling, SOB or lightheadedness with hypotension: Yes Has patient had a PCN reaction causing severe rash involving mucus membranes or skin necrosis: No Has patient had a PCN reaction that required hospitalization No Has patient had a PCN reaction occurring within the last 10 years: No If all of the above answers are "NO", then may proceed with Cephalosporin use.    Sulfa Antibiotics Hives    Immunization History  Administered Date(s) Administered   Fluad Quad(high Dose 65+) 08/23/2019, 12/07/2020   Influenza Whole 09/18/2009, 11/27/2010   Influenza, High Dose Seasonal PF 09/21/2017, 11/04/2018   Influenza,inj,Quad PF,6+ Mos 09/13/2013, 01/23/2016, 08/15/2016   Moderna Sars-Covid-2 Vaccination 02/06/2020, 03/07/2020   Pneumococcal Polysaccharide-23 09/18/2009, 01/23/2016    Past Medical History:  Diagnosis Date  Arthritis    Chronic rhinitis    COPD (chronic obstructive pulmonary disease) (HCC)    PFT 06-26-09 FEV1  1.75( 71%), FVC 3.65( 101%), FEV1% 48, TLC 5.53(104%), DLCO 55%, no BD   Hypertension    Nasal septal deviation    Nocturia    On home O2    2L N/C   OSA (obstructive sleep apnea) 04/21/2011   Skin cancer     Tobacco History: Social History   Tobacco Use  Smoking Status Former   Packs/day: 1.50   Years: 54.00   Pack years: 81.00   Types: Cigarettes   Quit date: 12/01/2001   Years since quitting: 20.0  Smokeless  Tobacco Never   Counseling given: Not Answered   Outpatient Medications Prior to Visit  Medication Sig Dispense Refill   albuterol (VENTOLIN HFA) 108 (90 Base) MCG/ACT inhaler Inhale 1-2 puffs into the lungs every 6 (six) hours as needed for wheezing or shortness of breath. 1 each 3   Ascorbic Acid (VITAMIN C) 1000 MG tablet Take 1,000 mg by mouth daily.     Budeson-Glycopyrrol-Formoterol (BREZTRI AEROSPHERE) 160-9-4.8 MCG/ACT AERO Inhale 2 puffs into the lungs in the morning and at bedtime.     cholecalciferol (VITAMIN D) 25 MCG (1000 UNIT) tablet Take 3,000 Units by mouth daily.     cyanocobalamin 2000 MCG tablet Take 1,000 mcg by mouth daily.     Multiple Vitamin (MULTIVITAMIN) tablet Take 1 tablet by mouth daily. Men's health     Nebulizer MISC 1 each by Does not apply route daily as needed. PLEASE PROVIDE NEBULIZER KIT TO INCLUDE TUBING/MASK/MOUTHPIECE 1 each 3   Omega-3 Fatty Acids (FISH OIL CONCENTRATE PO) Take 1 capsule by mouth daily.     OXYGEN Inhale 2 L into the lungs continuous.     pyridoxine (B-6) 100 MG tablet Take 100 mg by mouth daily.     ondansetron (ZOFRAN ODT) 8 MG disintegrating tablet Take 1 tablet (8 mg total) by mouth every 8 (eight) hours as needed for nausea or vomiting. (Patient not taking: Reported on 11/27/2021) 20 tablet 0   No facility-administered medications prior to visit.     Review of Systems:   Constitutional:   No  weight loss, night sweats,  Fevers, chills,  +fatigue, or  lassitude.  HEENT:   No headaches,  Difficulty swallowing,  Tooth/dental problems, or  Sore throat,                No sneezing, itching, ear ache, nasal congestion, post nasal drip,   CV:  No chest pain,  Orthopnea, PND, swelling in lower extremities, anasarca, dizziness, palpitations, syncope.   GI  No heartburn, indigestion, abdominal pain, nausea, vomiting, diarrhea, change in bowel habits, loss of appetite, bloody stools.   Resp:   No chest wall deformity  Skin: no  rash or lesions.  GU: no dysuria, change in color of urine, no urgency or frequency.  No flank pain, no hematuria   MS:  No joint pain or swelling.  No decreased range of motion.  No back pain.    Physical Exam  BP 132/60 (BP Location: Left Arm, Patient Position: Sitting, Cuff Size: Normal)    Pulse 84    Temp 97.9 F (36.6 C) (Oral)    Ht _0  (1.651 m)    Wt 141 lb 3.2 oz (64 kg)    SpO2 92%    BMI 23.50 kg/m   GEN: A/Ox3; pleasant , NAD, well nourished  HEENT:  [place]/AT,  , NOSE-clear, THROAT-clear, no lesions, no postnasal drip or exudate noted.   NECK:  Supple w/ fair ROM; no JVD; normal carotid impulses w/o bruits; no thyromegaly or nodules palpated; no lymphadenopathy.    RESP  few scattered rhonchi , no accessory muscle use, no dullness to percussion  CARD:  RRR, no m/r/g, no peripheral edema, pulses intact, no cyanosis or clubbing.  GI:   Soft & nt; nml bowel sounds; no organomegaly or masses detected.   Musco: Warm bil, no deformities or joint swelling noted.   Neuro: alert, no focal deficits noted.    Skin: Warm, no lesions or rashes    Lab Results:  CBC   BMET   BNP   ProBNP No results found for: PROBNP  Imaging: DG Chest 2 View  Result Date: 10/31/2021 CLINICAL DATA:  Productive cough, right chest pain, former smoker EXAM: CHEST - 2 VIEW COMPARISON:  09/26/2021 FINDINGS: The heart size and mediastinal contours are within normal limits. Emphysema and pulmonary hyperinflation. Disc degenerative disease of the thoracic spine. IMPRESSION: Emphysema without acute abnormality of the lungs. Electronically Signed   By: Delanna Ahmadi M.D.   On: 10/31/2021 16:43   MR Brain W Wo Contrast  Result Date: 11/21/2021 CLINICAL DATA:  Mental status change, unknown cause EXAM: MRI HEAD WITHOUT AND WITH CONTRAST TECHNIQUE: Multiplanar, multiecho pulse sequences of the brain and surrounding structures were obtained without and with intravenous contrast. CONTRAST:  18mL  GADAVIST GADOBUTROL 1 MMOL/ML IV SOLN COMPARISON:  2013 FINDINGS: Brain: There is no acute infarction or intracranial hemorrhage. There is no intracranial mass, mass effect, or edema. There is no hydrocephalus or extra-axial fluid collection. Prominence of the ventricles and sulci reflects parenchymal volume loss. Patchy foci of T2 hyperintensity the supratentorial white matter are nonspecific but may reflect mild chronic microvascular ischemic changes. There are chronic small vessel infarcts and prominent perivascular spaces of the central white matter and deep gray nuclei. No abnormal enhancement. Vascular: Major vessel flow voids at the skull base are preserved. Skull and upper cervical spine: Normal marrow signal is preserved. Sinuses/Orbits: Minor mucosal thickening.  Right lens replacement. Other: Sella is partially empty.  Mastoid air cells are clear. IMPRESSION: No evidence of recent infarction, hemorrhage, or mass. No abnormal enhancement. Chronic microvascular ischemic changes. Chronic small vessel infarcts. Electronically Signed   By: Macy Mis M.D.   On: 11/21/2021 08:37      PFT Results Latest Ref Rng & Units 05/04/2015  FVC-Pre L 2.62  FVC-Predicted Pre % 69  FVC-Post L 2.96  FVC-Predicted Post % 78  Pre FEV1/FVC % % 53  Post FEV1/FCV % % 56  FEV1-Pre L 1.38  FEV1-Predicted Pre % 51  FEV1-Post L 1.67  DLCO uncorrected ml/min/mmHg 10.31  DLCO UNC% % 35  DLVA Predicted % 54    No results found for: NITRICOXIDE      Assessment & Plan:   COPD GOLD 2 Compensated on present regimen. Spirometry shows decline in lung function with FEV1 at 53%.  Check alpha-1 Continue on triple maintenance inhaler.   Plan  Patient Instructions  Continue on BREZTRI 2 puffs Twice daily , rinse after use.  Albuterol inhaler or neb every 6hr as needed.  Mucinex DM Twice daily  As needed  cough/congestion  Restart Flutter valve Twice daily  .  Continue on Oxygen 2l/m .  Activity as  tolerated.  Lab today .  Follow up with Dr. Melvyn Novas  in 2 months and As  needed   Flu shot today       Chronic respiratory failure with hypoxia (HCC) Continue on oxygen at 2 L.  O2 saturation goal greater than 88 to 90%  Plan  Patient Instructions  Continue on BREZTRI 2 puffs Twice daily , rinse after use.  Albuterol inhaler or neb every 6hr as needed.  Mucinex DM Twice daily  As needed  cough/congestion  Restart Flutter valve Twice daily  .  Continue on Oxygen 2l/m .  Activity as tolerated.  Lab today .  Follow up with Dr. Melvyn Novas  in 2 months and As needed   Flu shot today       Preop pulmonary/respiratory exam Pulmonary preop risk assessment patient is at a high surgical risk from a pulmonary standpoint.  He has underlying oxygen dependent respiratory failure.  He is not excluded from surgery.  Went over in detail with patient his pulmonary risk potential.  Spirometry does show lung function has declined slightly with FEV1 at 53%.  Patient is fully independent and appears at his baseline.   Major Pulmonary risks identified in the multifactorial risk analysis are but not limited to a) pneumonia; b) recurrent intubation risk; c) prolonged or recurrent acute respiratory failure needing mechanical ventilation; d) prolonged hospitalization; e) DVT/Pulmonary embolism; f) Acute Pulmonary edema  Recommend 1. Short duration of surgery as much as possible and avoid paralytic if possible 2. Recovery in step down or ICU with Pulmonary consultation if indicated  3. DVT prophylaxis if indicated  4. Aggressive pulmonary toilet with o2, bronchodilatation, and incentive spirometry and early ambulation 5. CPAP in post op setting if indicated.        I spent   45 minutes dedicated to the care of this patient on the date of this encounter to include pre-visit review of records, face-to-face time with the patient discussing conditions above, post visit ordering of testing, clinical documentation  with the electronic health record, making appropriate referrals as documented, and communicating necessary findings to members of the patients care team.   Rexene Edison, NP 11/29/2021

## 2021-11-29 NOTE — Assessment & Plan Note (Addendum)
Continue on oxygen at 2 L.  O2 saturation goal greater than 88 to 90%  Plan  Patient Instructions  Continue on BREZTRI 2 puffs Twice daily , rinse after use.  Albuterol inhaler or neb every 6hr as needed.  Mucinex DM Twice daily  As needed  cough/congestion  Restart Flutter valve Twice daily  .  Continue on Oxygen 2l/m .  Activity as tolerated.  Lab today .  Follow up with Dr. Melvyn Novas  in 2 months and As needed   Flu shot today

## 2021-11-29 NOTE — Addendum Note (Signed)
Addended by: Vanessa Barbara on: 11/29/2021 11:35 AM   Modules accepted: Orders

## 2021-11-29 NOTE — Assessment & Plan Note (Signed)
Pulmonary preop risk assessment patient is at a high surgical risk from a pulmonary standpoint.  He has underlying oxygen dependent respiratory failure.  He is not excluded from surgery.  Went over in detail with patient his pulmonary risk potential.  Spirometry does show lung function has declined slightly with FEV1 at 53%.  Patient is fully independent and appears at his baseline.   Major Pulmonary risks identified in the multifactorial risk analysis are but not limited to a) pneumonia; b) recurrent intubation risk; c) prolonged or recurrent acute respiratory failure needing mechanical ventilation; d) prolonged hospitalization; e) DVT/Pulmonary embolism; f) Acute Pulmonary edema  Recommend 1. Short duration of surgery as much as possible and avoid paralytic if possible 2. Recovery in step down or ICU with Pulmonary consultation if indicated  3. DVT prophylaxis if indicated  4. Aggressive pulmonary toilet with o2, bronchodilatation, and incentive spirometry and early ambulation 5. CPAP in post op setting if indicated.

## 2021-11-29 NOTE — Assessment & Plan Note (Addendum)
Compensated on present regimen. Spirometry shows decline in lung function with FEV1 at 53%.  Check alpha-1 Continue on triple maintenance inhaler.   Plan  Patient Instructions  Continue on BREZTRI 2 puffs Twice daily , rinse after use.  Albuterol inhaler or neb every 6hr as needed.  Mucinex DM Twice daily  As needed  cough/congestion  Restart Flutter valve Twice daily  .  Continue on Oxygen 2l/m .  Activity as tolerated.  Lab today .  Follow up with Dr. Melvyn Novas  in 2 months and As needed   Flu shot today

## 2021-12-04 NOTE — Patient Instructions (Signed)
Michael Evans  12/04/2021     @PREFPERIOPPHARMACY @   Your procedure is scheduled on  12/09/2021.   Report to [facility] at  0730 A.M.   Call this number if you have problems the morning of surgery:  [phone]   Remember:  Do not eat or drink after midnight.    Use your nebulizer and your inhalers before you come and bring your rescue inhaler with you.      Take these medicines the morning of surgery with A SIP OF WATER                                                   None.     Do not wear jewelry, make-up or nail polish.  Do not wear lotions, powders, or perfumes, or deodorant.  Do not shave 48 hours prior to surgery.  Men may shave face and neck.  Do not bring valuables to the hospital.  [facility] is not responsible for any belongings or valuables.  Contacts, dentures or bridgework may not be worn into surgery.  Leave your suitcase in the car.  After surgery it may be brought to your room.  For patients admitted to the hospital, discharge time will be determined by your treatment team.  Patients discharged the day of surgery will not be allowed to drive home and must have someone with them for 24 hours.    Special instructions:   DO NOT smoke tobacco or vape for 24 hours before your procedure.  Please read over the following fact sheets that you were given. Coughing and Deep Breathing, Surgical Site Infection Prevention, Anesthesia Post-op Instructions, and Care and Recovery After Surgery      Open Hernia Repair, Adult, Care After What can I expect after the procedure? After the procedure, it is common to have: Mild discomfort. Slight bruising. Mild swelling. Pain in the belly (abdomen). A small amount of blood from the cut from surgery (incision). Follow these instructions at home: Your doctor may give you more specific instructions. If you have problems, call your doctor. Medicines Take over-the-counter and prescription medicines only as  told by your doctor. If told, take steps to prevent problems with pooping (constipation). You may need to: Drink enough fluid to keep your pee (urine) pale yellow. Take medicines. You will be told what medicines to take. Eat foods that are high in fiber. These include beans, whole grains, and fresh fruits and vegetables. Limit foods that are high in fat and sugar. These include fried or sweet foods. Ask your doctor if you should avoid driving or using machines while you are taking your medicine. Incision care  Follow instructions from your doctor about how to take care of your incision. Make sure you: Wash your hands with soap and water for at least 20 seconds before and after you change your bandage (dressing). If you cannot use soap and water, use hand sanitizer. Change your bandage. Leave stitches or skin glue in place for at least 2 weeks. Leave tape strips alone unless you are told to take them off. You may trim the edges of the tape strips if they curl up. Check your incision every day for signs of infection. Check for: More redness, swelling, or pain. More fluid or blood. Warmth. Pus or a bad  smell. Wear loose, soft clothing while your incision heals. Activity  Rest as told by your doctor. Do not lift anything that is heavier than 10 lb (4.5 kg), or the limit that you are told. Do not play contact sports until your doctor says that this is safe. If you were given a sedative during your procedure, do not drive or use machines until your doctor says that it is safe. A sedative is a medicine that helps you relax. Return to your normal activities when your doctor says that it is safe. General instructions Do not take baths, swim, or use a hot tub. Ask your doctor about taking showers or sponge baths. Hold a pillow over your belly when you cough or sneeze. This helps with pain. Do not smoke or use any products that contain nicotine or tobacco. If you need help quitting, ask your  doctor. Keep all follow-up visits. Contact a doctor if: You have any of these signs of infection in or around your incision: More redness, swelling, or pain. More fluid or blood. Warmth. Pus. A bad smell. You have a fever or chills. You have blood in your poop (stool). You have not pooped (had a bowel movement) in 2-3 days. Medicine does not help your pain. Get help right away if: You have chest pain, or you are short of breath. You feel faint or light-headed. You have very bad pain. You vomit and your pain is worse. You have pain, swelling, or redness in a leg. These symptoms may be an emergency. Get help right away. Call your local emergency services (911 in the [place] Do not wait to see if the symptoms will go away. Do not drive yourself to the hospital. Summary After this procedure, it is common to have mild discomfort, slight bruising, and mild swelling. Follow instructions from your doctor about how to take care of your cut from surgery (incision). Check every day for signs of infection. Do not lift heavy objects or play contact sports until your doctor says it is safe. Return to your normal activities as told by your doctor. This information is not intended to replace advice given to you by your health care provider. Make sure you discuss any questions you have with your health care provider. Document Revised: 07/02/2020 Document Reviewed: 07/02/2020 Elsevier Patient Education  2022 [place] Anesthesia, Adult, Care After This sheet gives you information about how to care for yourself after your procedure. Your health care provider may also give you more specific instructions. If you have problems or questions, contact your health care provider. What can I expect after the procedure? After the procedure, the following side effects are common: Pain or discomfort at the IV site. Nausea. Vomiting. Sore throat. Trouble concentrating. Feeling cold or  chills. Feeling weak or tired. Sleepiness and fatigue. Soreness and body aches. These side effects can affect parts of the body that were not involved in surgery. Follow these instructions at home: For the time period you were told by your health care provider:  Rest. Do not participate in activities where you could fall or become injured. Do not drive or use machinery. Do not drink alcohol. Do not take sleeping pills or medicines that cause drowsiness. Do not make important decisions or sign legal documents. Do not take care of children on your own. Eating and drinking Follow any instructions from your health care provider about eating or drinking restrictions. When you feel hungry, start by eating small amounts of foods that are  soft and easy to digest (bland), such as toast. Gradually return to your regular diet. Drink enough fluid to keep your urine pale yellow. If you vomit, rehydrate by drinking water, juice, or clear broth. General instructions If you have sleep apnea, surgery and certain medicines can increase your risk for breathing problems. Follow instructions from your health care provider about wearing your sleep device: Anytime you are sleeping, including during daytime naps. While taking prescription pain medicines, sleeping medicines, or medicines that make you drowsy. Have a responsible adult stay with you for the time you are told. It is important to have someone help care for you until you are awake and alert. Return to your normal activities as told by your health care provider. Ask your health care provider what activities are safe for you. Take over-the-counter and prescription medicines only as told by your health care provider. If you smoke, do not smoke without supervision. Keep all follow-up visits as told by your health care provider. This is important. Contact a health care provider if: You have nausea or vomiting that does not get better with medicine. You  cannot eat or drink without vomiting. You have pain that does not get better with medicine. You are unable to pass urine. You develop a skin rash. You have a fever. You have redness around your IV site that gets worse. Get help right away if: You have difficulty breathing. You have chest pain. You have blood in your urine or stool, or you vomit blood. Summary After the procedure, it is common to have a sore throat or nausea. It is also common to feel tired. Have a responsible adult stay with you for the time you are told. It is important to have someone help care for you until you are awake and alert. When you feel hungry, start by eating small amounts of foods that are soft and easy to digest (bland), such as toast. Gradually return to your regular diet. Drink enough fluid to keep your urine pale yellow. Return to your normal activities as told by your health care provider. Ask your health care provider what activities are safe for you. This information is not intended to replace advice given to you by your health care provider. Make sure you discuss any questions you have with your health care provider. Document Revised: 08/02/2020 Document Reviewed: 03/01/2020 Elsevier Patient Education  2022 [place]. How to Use Chlorhexidine for Bathing Chlorhexidine gluconate (CHG) is a germ-killing (antiseptic) solution that is used to clean the skin. It can get rid of the bacteria that normally live on the skin and can keep them away for about 24 hours. To clean your skin with CHG, you may be given: A CHG solution to use in the shower or as part of a sponge bath. A prepackaged cloth that contains CHG. Cleaning your skin with CHG may help lower the risk for infection: While you are staying in the intensive care unit of the hospital. If you have a vascular access, such as a central line, to provide short-term or long-term access to your veins. If you have a catheter to drain urine from your  bladder. If you are on a ventilator. A ventilator is a machine that helps you breathe by moving air in and out of your lungs. After surgery. What are the risks? Risks of using CHG include: A skin reaction. Hearing loss, if CHG gets in your ears and you have a perforated eardrum. Eye injury, if CHG gets in your  eyes and is not rinsed out. The CHG product catching fire. Make sure that you avoid smoking and flames after applying CHG to your skin. Do not use CHG: If you have a chlorhexidine allergy or have previously reacted to chlorhexidine. On babies younger than 6 months of age. How to use CHG solution Use CHG only as told by your health care provider, and follow the instructions on the label. Use the full amount of CHG as directed. Usually, this is one bottle. During a shower Follow these steps when using CHG solution during a shower (unless your health care provider gives you different instructions): Start the shower. Use your normal soap and shampoo to wash your face and hair. Turn off the shower or move out of the shower stream. Pour the CHG onto a clean washcloth. Do not use any type of brush or rough-edged sponge. Starting at your neck, lather your body down to your toes. Make sure you follow these instructions: If you will be having surgery, pay special attention to the part of your body where you will be having surgery. Scrub this area for at least 1 minute. Do not use CHG on your head or face. If the solution gets into your ears or eyes, rinse them well with water. Avoid your genital area. Avoid any areas of skin that have broken skin, cuts, or scrapes. Scrub your back and under your arms. Make sure to wash skin folds. Let the lather sit on your skin for 1-2 minutes or as long as told by your health care provider. Thoroughly rinse your entire body in the shower. Make sure that all body creases and crevices are rinsed well. Dry off with a clean towel. Do not put any substances on  your body afterward--such as powder, lotion, or perfume--unless you are told to do so by your health care provider. Only use lotions that are recommended by the manufacturer. Put on clean clothes or pajamas. If it is the night before your surgery, sleep in clean sheets.  During a sponge bath Follow these steps when using CHG solution during a sponge bath (unless your health care provider gives you different instructions): Use your normal soap and shampoo to wash your face and hair. Pour the CHG onto a clean washcloth. Starting at your neck, lather your body down to your toes. Make sure you follow these instructions: If you will be having surgery, pay special attention to the part of your body where you will be having surgery. Scrub this area for at least 1 minute. Do not use CHG on your head or face. If the solution gets into your ears or eyes, rinse them well with water. Avoid your genital area. Avoid any areas of skin that have broken skin, cuts, or scrapes. Scrub your back and under your arms. Make sure to wash skin folds. Let the lather sit on your skin for 1-2 minutes or as long as told by your health care provider. Using a different clean, wet washcloth, thoroughly rinse your entire body. Make sure that all body creases and crevices are rinsed well. Dry off with a clean towel. Do not put any substances on your body afterward--such as powder, lotion, or perfume--unless you are told to do so by your health care provider. Only use lotions that are recommended by the manufacturer. Put on clean clothes or pajamas. If it is the night before your surgery, sleep in clean sheets. How to use CHG prepackaged cloths Only use CHG cloths as told by  your health care provider, and follow the instructions on the label. Use the CHG cloth on clean, dry skin. Do not use the CHG cloth on your head or face unless your health care provider tells you to. When washing with the CHG cloth: Avoid your genital  area. Avoid any areas of skin that have broken skin, cuts, or scrapes. Before surgery Follow these steps when using a CHG cloth to clean before surgery (unless your health care provider gives you different instructions): Using the CHG cloth, vigorously scrub the part of your body where you will be having surgery. Scrub using a back-and-forth motion for 3 minutes. The area on your body should be completely wet with CHG when you are done scrubbing. Do not rinse. Discard the cloth and let the area air-dry. Do not put any substances on the area afterward, such as powder, lotion, or perfume. Put on clean clothes or pajamas. If it is the night before your surgery, sleep in clean sheets.  For general bathing Follow these steps when using CHG cloths for general bathing (unless your health care provider gives you different instructions). Use a separate CHG cloth for each area of your body. Make sure you wash between any folds of skin and between your fingers and toes. Wash your body in the following order, switching to a new cloth after each step: The front of your neck, shoulders, and chest. Both of your arms, under your arms, and your hands. Your stomach and groin area, avoiding the genitals. Your right leg and foot. Your left leg and foot. The back of your neck, your back, and your buttocks. Do not rinse. Discard the cloth and let the area air-dry. Do not put any substances on your body afterward--such as powder, lotion, or perfume--unless you are told to do so by your health care provider. Only use lotions that are recommended by the manufacturer. Put on clean clothes or pajamas. Contact a health care provider if: Your skin gets irritated after scrubbing. You have questions about using your solution or cloth. You swallow any chlorhexidine. Call your local poison control center (1-818-840-0419 in the [place] Get help right away if: Your eyes itch badly, or they become very red or swollen. Your  skin itches badly and is red or swollen. Your hearing changes. You have trouble seeing. You have swelling or tingling in your mouth or throat. You have trouble breathing. These symptoms may represent a serious problem that is an emergency. Do not wait to see if the symptoms will go away. Get medical help right away. Call your local emergency services (911 in the [place] Do not drive yourself to the hospital. Summary Chlorhexidine gluconate (CHG) is a germ-killing (antiseptic) solution that is used to clean the skin. Cleaning your skin with CHG may help to lower your risk for infection. You may be given CHG to use for bathing. It may be in a bottle or in a prepackaged cloth to use on your skin. Carefully follow your health care provider's instructions and the instructions on the product label. Do not use CHG if you have a chlorhexidine allergy. Contact your health care provider if your skin gets irritated after scrubbing. This information is not intended to replace advice given to you by your health care provider. Make sure you discuss any questions you have with your health care provider. Document Revised: 01/28/2021 Document Reviewed: 01/28/2021 Elsevier Patient Education  2022 [employer].

## 2021-12-06 ENCOUNTER — Encounter (HOSPITAL_COMMUNITY)
Admission: RE | Admit: 2021-12-06 | Discharge: 2021-12-06 | Disposition: A | Discharge status: Home / Self Care | Payer: Medicare HMO | Source: Ambulatory Visit | Attending: General Surgery | Admitting: General Surgery

## 2021-12-06 ENCOUNTER — Other Ambulatory Visit (HOSPITAL_COMMUNITY)
Admission: RE | Admit: 2021-12-06 | Discharge: 2021-12-06 | Disposition: A | Discharge status: Home / Self Care | Payer: Medicare HMO | Source: Ambulatory Visit | Attending: General Surgery | Admitting: General Surgery

## 2021-12-06 VITALS — BP 145/80 | HR 77 | Temp 97.9°F | Resp 18 | Ht 65.0 in | Wt 138.0 lb

## 2021-12-06 DIAGNOSIS — Z01812 Encounter for preprocedural laboratory examination: Secondary | ICD-10-CM | POA: Diagnosis not present

## 2021-12-06 DIAGNOSIS — Z20822 Contact with and (suspected) exposure to covid-19: Secondary | ICD-10-CM | POA: Diagnosis not present

## 2021-12-06 DIAGNOSIS — Z01818 Encounter for other preprocedural examination: Secondary | ICD-10-CM

## 2021-12-06 NOTE — Progress Notes (Signed)
Patient here for PAT appointment and states he has been coughing up green sputum for about 2 weeks.  He is afebrile and states the cough has not worsened in the last few weeks. We will reevaluate him on the day of the procedure and if symptoms are not any worse then we will proceed.

## 2021-12-07 LAB — SARS CORONAVIRUS 2 (TAT 6-24 HRS): SARS Coronavirus 2: NEGATIVE

## 2021-12-07 LAB — ALPHA-1 ANTITRYPSIN PHENOTYPE: A-1 Antitrypsin, Ser: 150 mg/dL (ref 83–199)

## 2021-12-09 ENCOUNTER — Ambulatory Visit (HOSPITAL_COMMUNITY): Payer: Medicare HMO | Admitting: Anesthesiology

## 2021-12-09 ENCOUNTER — Encounter (HOSPITAL_COMMUNITY)
Admission: RE | Disposition: A | Discharge status: Home / Self Care | Payer: Self-pay | Source: Home / Self Care | Attending: General Surgery

## 2021-12-09 ENCOUNTER — Encounter (HOSPITAL_COMMUNITY): Payer: Self-pay | Admitting: General Surgery

## 2021-12-09 ENCOUNTER — Other Ambulatory Visit: Payer: Self-pay

## 2021-12-09 ENCOUNTER — Observation Stay (HOSPITAL_COMMUNITY)
Admission: RE | Admit: 2021-12-09 | Discharge: 2021-12-10 | Disposition: A | Discharge status: Home / Self Care | Payer: Medicare HMO | Attending: General Surgery | Admitting: General Surgery

## 2021-12-09 DIAGNOSIS — I10 Essential (primary) hypertension: Secondary | ICD-10-CM | POA: Diagnosis not present

## 2021-12-09 DIAGNOSIS — K4090 Unilateral inguinal hernia, without obstruction or gangrene, not specified as recurrent: Principal | ICD-10-CM | POA: Insufficient documentation

## 2021-12-09 DIAGNOSIS — J449 Chronic obstructive pulmonary disease, unspecified: Secondary | ICD-10-CM | POA: Diagnosis not present

## 2021-12-09 DIAGNOSIS — Z85828 Personal history of other malignant neoplasm of skin: Secondary | ICD-10-CM | POA: Diagnosis not present

## 2021-12-09 DIAGNOSIS — Z87891 Personal history of nicotine dependence: Secondary | ICD-10-CM | POA: Insufficient documentation

## 2021-12-09 DIAGNOSIS — J9611 Chronic respiratory failure with hypoxia: Secondary | ICD-10-CM | POA: Diagnosis not present

## 2021-12-09 DIAGNOSIS — R058 Other specified cough: Secondary | ICD-10-CM | POA: Diagnosis not present

## 2021-12-09 HISTORY — PX: INGUINAL HERNIA REPAIR: SHX194

## 2021-12-09 LAB — MRSA NEXT GEN BY PCR, NASAL: MRSA by PCR Next Gen: NOT DETECTED

## 2021-12-09 SURGERY — REPAIR, HERNIA, INGUINAL, ADULT
Anesthesia: General | Site: Inguinal | Laterality: Left

## 2021-12-09 MED ORDER — ALBUTEROL SULFATE HFA 108 (90 BASE) MCG/ACT IN AERS
INHALATION_SPRAY | RESPIRATORY_TRACT | Status: DC | PRN
  Administered 2021-12-09 (×2): 2 via RESPIRATORY_TRACT

## 2021-12-09 MED ORDER — ADULT MULTIVITAMIN W/MINERALS CH
1.0000 | ORAL_TABLET | Freq: Every day | ORAL | Status: DC
  Administered 2021-12-10: 1 via ORAL
  Filled 2021-12-09 (×3): qty 1

## 2021-12-09 MED ORDER — ONDANSETRON HCL 4 MG/2ML IJ SOLN
INTRAMUSCULAR | Status: DC | PRN
  Administered 2021-12-09: 4 mg via INTRAVENOUS

## 2021-12-09 MED ORDER — FENTANYL CITRATE (PF) 100 MCG/2ML IJ SOLN
INTRAMUSCULAR | Status: AC
  Filled 2021-12-09: qty 2

## 2021-12-09 MED ORDER — FENTANYL CITRATE (PF) 100 MCG/2ML IJ SOLN
INTRAMUSCULAR | Status: DC | PRN
  Administered 2021-12-09 (×2): 50 ug via INTRAVENOUS

## 2021-12-09 MED ORDER — HEPARIN SODIUM (PORCINE) 5000 UNIT/ML IJ SOLN
5000.0000 [IU] | Freq: Three times a day (TID) | INTRAMUSCULAR | Status: DC
  Administered 2021-12-09 – 2021-12-10 (×2): 5000 [IU] via SUBCUTANEOUS
  Filled 2021-12-09 (×2): qty 1

## 2021-12-09 MED ORDER — LIDOCAINE HCL (CARDIAC) PF 100 MG/5ML IV SOSY
PREFILLED_SYRINGE | INTRAVENOUS | Status: DC | PRN
Start: 2021-12-09 — End: 2021-12-09
  Administered 2021-12-09: 60 mg via INTRATRACHEAL

## 2021-12-09 MED ORDER — VITAMIN B-12 1000 MCG PO TABS
1000.0000 ug | ORAL_TABLET | Freq: Every day | ORAL | Status: DC
  Administered 2021-12-10: 1000 ug via ORAL
  Filled 2021-12-09: qty 1

## 2021-12-09 MED ORDER — GUAIFENESIN-DM 100-10 MG/5ML PO SYRP: 5.0000 mL | ORAL_SOLUTION | ORAL | Status: DC | PRN

## 2021-12-09 MED ORDER — ONDANSETRON HCL 4 MG/2ML IJ SOLN: 4.0000 mg | Freq: Once | INTRAMUSCULAR | Status: DC | PRN

## 2021-12-09 MED ORDER — DEXAMETHASONE SODIUM PHOSPHATE 10 MG/ML IJ SOLN
INTRAMUSCULAR | Status: AC
  Filled 2021-12-09: qty 1

## 2021-12-09 MED ORDER — DIPHENHYDRAMINE HCL 12.5 MG/5ML PO ELIX: 12.5000 mg | ORAL_SOLUTION | Freq: Four times a day (QID) | ORAL | Status: DC | PRN

## 2021-12-09 MED ORDER — CHLORHEXIDINE GLUCONATE CLOTH 2 % EX PADS
6.0000 | MEDICATED_PAD | Freq: Every day | CUTANEOUS | Status: DC
  Administered 2021-12-09 – 2021-12-10 (×2): 6 via TOPICAL

## 2021-12-09 MED ORDER — HYDROMORPHONE HCL 1 MG/ML IJ SOLN: 0.5000 mg | INTRAMUSCULAR | Status: DC | PRN

## 2021-12-09 MED ORDER — UMECLIDINIUM BROMIDE 62.5 MCG/ACT IN AEPB
1.0000 | INHALATION_SPRAY | Freq: Every day | RESPIRATORY_TRACT | Status: DC
  Administered 2021-12-10: 1 via RESPIRATORY_TRACT
  Filled 2021-12-09: qty 7

## 2021-12-09 MED ORDER — CIPROFLOXACIN IN D5W 400 MG/200ML IV SOLN
400.0000 mg | INTRAVENOUS | Status: AC
  Administered 2021-12-09: 400 mg via INTRAVENOUS
  Filled 2021-12-09: qty 200

## 2021-12-09 MED ORDER — BUPIVACAINE HCL (300 MG DOSE) 3 X 100 MG IL IMPL
DRUG_IMPLANT | Status: DC | PRN
  Administered 2021-12-09: 300 mg

## 2021-12-09 MED ORDER — LACTATED RINGERS IV SOLN: INTRAVENOUS | Status: DC

## 2021-12-09 MED ORDER — PROPOFOL 10 MG/ML IV BOLUS
INTRAVENOUS | Status: DC | PRN
Start: 2021-12-09 — End: 2021-12-09
  Administered 2021-12-09: 50 mg via INTRAVENOUS

## 2021-12-09 MED ORDER — BUPIVACAINE LIPOSOME 1.3 % IJ SUSP
INTRAMUSCULAR | Status: AC
  Filled 2021-12-09: qty 20

## 2021-12-09 MED ORDER — ONDANSETRON HCL 4 MG/2ML IJ SOLN
INTRAMUSCULAR | Status: AC
  Filled 2021-12-09: qty 2

## 2021-12-09 MED ORDER — PHENYLEPHRINE HCL (PRESSORS) 10 MG/ML IV SOLN
INTRAVENOUS | Status: DC | PRN
  Administered 2021-12-09: 80 ug via INTRAVENOUS

## 2021-12-09 MED ORDER — PHENOL 1.4 % MT LIQD
1.0000 | OROMUCOSAL | Status: DC | PRN
  Filled 2021-12-09: qty 177

## 2021-12-09 MED ORDER — CHLORHEXIDINE GLUCONATE 0.12 % MT SOLN
OROMUCOSAL | Status: AC
  Filled 2021-12-09: qty 15

## 2021-12-09 MED ORDER — LIDOCAINE HCL (PF) 2 % IJ SOLN
INTRAMUSCULAR | Status: AC
  Filled 2021-12-09: qty 5

## 2021-12-09 MED ORDER — DEXAMETHASONE SODIUM PHOSPHATE 4 MG/ML IJ SOLN
INTRAMUSCULAR | Status: DC | PRN
  Administered 2021-12-09: 8 mg via INTRAVENOUS

## 2021-12-09 MED ORDER — ORAL CARE MOUTH RINSE: 15.0000 mL | Freq: Once | OROMUCOSAL | Status: DC

## 2021-12-09 MED ORDER — ASCORBIC ACID 500 MG PO TABS
1000.0000 mg | ORAL_TABLET | Freq: Every day | ORAL | Status: DC
  Administered 2021-12-10: 1000 mg via ORAL
  Filled 2021-12-09: qty 2

## 2021-12-09 MED ORDER — PROPOFOL 10 MG/ML IV BOLUS
INTRAVENOUS | Status: AC
  Filled 2021-12-09: qty 20

## 2021-12-09 MED ORDER — VITAMIN B-6 50 MG PO TABS
100.0000 mg | ORAL_TABLET | Freq: Every day | ORAL | Status: DC
  Administered 2021-12-10: 100 mg via ORAL
  Filled 2021-12-09 (×2): qty 1

## 2021-12-09 MED ORDER — ETOMIDATE 2 MG/ML IV SOLN
INTRAVENOUS | Status: DC | PRN
  Administered 2021-12-09: 10 mg via INTRAVENOUS

## 2021-12-09 MED ORDER — ALBUTEROL SULFATE (2.5 MG/3ML) 0.083% IN NEBU: 2.5000 mg | INHALATION_SOLUTION | Freq: Four times a day (QID) | RESPIRATORY_TRACT | Status: DC | PRN

## 2021-12-09 MED ORDER — ALBUTEROL SULFATE HFA 108 (90 BASE) MCG/ACT IN AERS: 1.0000 | INHALATION_SPRAY | Freq: Four times a day (QID) | RESPIRATORY_TRACT | Status: DC | PRN

## 2021-12-09 MED ORDER — ACETAMINOPHEN 500 MG PO TABS: 1000.0000 mg | ORAL_TABLET | Freq: Four times a day (QID) | ORAL | Status: DC

## 2021-12-09 MED ORDER — VITAMIN D 25 MCG (1000 UNIT) PO TABS
3000.0000 [IU] | ORAL_TABLET | Freq: Every day | ORAL | Status: DC
  Administered 2021-12-10: 3000 [IU] via ORAL
  Filled 2021-12-09: qty 3

## 2021-12-09 MED ORDER — ROCURONIUM BROMIDE 10 MG/ML (PF) SYRINGE
PREFILLED_SYRINGE | INTRAVENOUS | Status: AC
  Filled 2021-12-09: qty 10

## 2021-12-09 MED ORDER — CHLORHEXIDINE GLUCONATE CLOTH 2 % EX PADS: 6.0000 | MEDICATED_PAD | Freq: Once | CUTANEOUS | Status: DC

## 2021-12-09 MED ORDER — PANTOPRAZOLE SODIUM 40 MG IV SOLR
40.0000 mg | Freq: Every day | INTRAVENOUS | Status: DC
  Administered 2021-12-09: 40 mg via INTRAVENOUS
  Filled 2021-12-09: qty 40

## 2021-12-09 MED ORDER — OXYCODONE HCL 5 MG PO TABS
5.0000 mg | ORAL_TABLET | ORAL | Status: DC | PRN
  Administered 2021-12-10: 5 mg via ORAL
  Filled 2021-12-09: qty 1

## 2021-12-09 MED ORDER — DM-GUAIFENESIN ER 30-600 MG PO TB12
2.0000 | ORAL_TABLET | Freq: Two times a day (BID) | ORAL | Status: DC
  Administered 2021-12-09 – 2021-12-10 (×3): 2 via ORAL
  Filled 2021-12-09 (×6): qty 2

## 2021-12-09 MED ORDER — METOPROLOL TARTRATE 5 MG/5ML IV SOLN: 5.0000 mg | Freq: Four times a day (QID) | INTRAVENOUS | Status: DC | PRN

## 2021-12-09 MED ORDER — MENTHOL 3 MG MT LOZG
1.0000 | LOZENGE | OROMUCOSAL | Status: DC | PRN
  Filled 2021-12-09: qty 9

## 2021-12-09 MED ORDER — ONDANSETRON 4 MG PO TBDP: 4.0000 mg | ORAL_TABLET | Freq: Four times a day (QID) | ORAL | Status: DC | PRN

## 2021-12-09 MED ORDER — MOMETASONE FURO-FORMOTEROL FUM 200-5 MCG/ACT IN AERO
2.0000 | INHALATION_SPRAY | Freq: Two times a day (BID) | RESPIRATORY_TRACT | Status: DC
  Administered 2021-12-09 – 2021-12-10 (×2): 2 via RESPIRATORY_TRACT
  Filled 2021-12-09: qty 8.8

## 2021-12-09 MED ORDER — CHLORHEXIDINE GLUCONATE 0.12 % MT SOLN: 15.0000 mL | Freq: Once | OROMUCOSAL | Status: DC

## 2021-12-09 MED ORDER — LACTATED RINGERS IV SOLN: INTRAVENOUS | Status: DC | PRN

## 2021-12-09 MED ORDER — FENTANYL CITRATE PF 50 MCG/ML IJ SOSY: 25.0000 ug | PREFILLED_SYRINGE | INTRAMUSCULAR | Status: DC | PRN

## 2021-12-09 MED ORDER — DIPHENHYDRAMINE HCL 50 MG/ML IJ SOLN: 12.5000 mg | Freq: Four times a day (QID) | INTRAMUSCULAR | Status: DC | PRN

## 2021-12-09 MED ORDER — BUPIVACAINE HCL (300 MG DOSE) 3 X 100 MG IL IMPL
DRUG_IMPLANT | Status: AC
  Filled 2021-12-09: qty 100

## 2021-12-09 MED ORDER — SODIUM CHLORIDE 0.9 % IR SOLN
Status: DC | PRN
  Administered 2021-12-09: 1000 mL

## 2021-12-09 MED ORDER — SUGAMMADEX SODIUM 200 MG/2ML IV SOLN
INTRAVENOUS | Status: DC | PRN
  Administered 2021-12-09: 125.2 mg via INTRAVENOUS

## 2021-12-09 MED ORDER — BUDESON-GLYCOPYRROL-FORMOTEROL 160-9-4.8 MCG/ACT IN AERO: 2.0000 | INHALATION_SPRAY | Freq: Two times a day (BID) | RESPIRATORY_TRACT | Status: DC

## 2021-12-09 MED ORDER — SIMETHICONE 80 MG PO CHEW: 40.0000 mg | CHEWABLE_TABLET | Freq: Four times a day (QID) | ORAL | Status: DC | PRN

## 2021-12-09 MED ORDER — ONDANSETRON HCL 4 MG/2ML IJ SOLN: 4.0000 mg | Freq: Four times a day (QID) | INTRAMUSCULAR | Status: DC | PRN

## 2021-12-09 MED ORDER — ROCURONIUM BROMIDE 100 MG/10ML IV SOLN
INTRAVENOUS | Status: DC | PRN
  Administered 2021-12-09: 40 mg via INTRAVENOUS

## 2021-12-09 MED ORDER — DOCUSATE SODIUM 100 MG PO CAPS
100.0000 mg | ORAL_CAPSULE | Freq: Two times a day (BID) | ORAL | Status: DC
  Administered 2021-12-09 – 2021-12-10 (×2): 100 mg via ORAL
  Filled 2021-12-09 (×2): qty 1

## 2021-12-09 SURGICAL SUPPLY — 30 items
ADH SKN CLS APL DERMABOND .7 (GAUZE/BANDAGES/DRESSINGS) ×1
CLOTH BEACON ORANGE TIMEOUT ST (SAFETY) ×2 IMPLANT
COVER LIGHT HANDLE STERIS (MISCELLANEOUS) ×4 IMPLANT
DERMABOND ADVANCED (GAUZE/BANDAGES/DRESSINGS) ×1
DERMABOND ADVANCED .7 DNX12 (GAUZE/BANDAGES/DRESSINGS) ×1 IMPLANT
DRAIN PENROSE 0.5X18 (DRAIN) ×2 IMPLANT
ELECT REM PT RETURN 9FT ADLT (ELECTROSURGICAL) ×2
ELECTRODE REM PT RTRN 9FT ADLT (ELECTROSURGICAL) ×1 IMPLANT
GAUZE 4X4 16PLY ~~LOC~~+RFID DBL (SPONGE) ×1 IMPLANT
GAUZE SPONGE 4X4 12PLY STRL (GAUZE/BANDAGES/DRESSINGS) ×2 IMPLANT
GLOVE SURG POLYISO LF SZ6.5 (GLOVE) ×3 IMPLANT
GLOVE SURG POLYISO LF SZ7 (GLOVE) ×2 IMPLANT
GLOVE SURG UNDER POLY LF SZ6.5 (GLOVE) ×1 IMPLANT
GLOVE SURG UNDER POLY LF SZ7 (GLOVE) ×8 IMPLANT
GOWN STRL REUS W/TWL LRG LVL3 (GOWN DISPOSABLE) ×7 IMPLANT
INST SET MINOR GENERAL (KITS) ×2 IMPLANT
KIT TURNOVER KIT A (KITS) ×2 IMPLANT
MANIFOLD NEPTUNE II (INSTRUMENTS) ×2 IMPLANT
MESH MARLEX PLUG MEDIUM (Mesh General) ×1 IMPLANT
NS IRRIG 1000ML POUR BTL (IV SOLUTION) ×2 IMPLANT
PACK MINOR (CUSTOM PROCEDURE TRAY) ×2 IMPLANT
PAD ARMBOARD 7.5X6 YLW CONV (MISCELLANEOUS) ×2 IMPLANT
SET BASIN LINEN APH (SET/KITS/TRAYS/PACK) ×2 IMPLANT
SOL PREP PROV IODINE SCRUB 4OZ (MISCELLANEOUS) ×2 IMPLANT
SUT MNCRL AB 4-0 PS2 18 (SUTURE) ×2 IMPLANT
SUT NOVA NAB GS-22 2 2-0 T-19 (SUTURE) ×3 IMPLANT
SUT VIC AB 2-0 CT1 27 (SUTURE) ×4
SUT VIC AB 2-0 CT1 TAPERPNT 27 (SUTURE) ×1 IMPLANT
SUT VIC AB 3-0 SH 27 (SUTURE) ×2
SUT VIC AB 3-0 SH 27X BRD (SUTURE) ×1 IMPLANT

## 2021-12-09 NOTE — Evaluation (Signed)
Physical Therapy Evaluation Patient Details Name: Michael Evans MRN: [id number] DOB: March 26, 1941 Today's Date: 12/09/2021  History of Present Illness  Michael Evans is a 81 y/o male, s/p Performed: Left inguinal hernia repair with mesh on 12/09/21, with the diagnosis of LEFT INGUINAL HERNIA   Clinical Impression  Patient demonstrates good return for getting out of/into bed without c/o pain, able to ambulate in room and hallways without loss of balance without need for AD.  Patient encouraged to ambulate ad lib and with nursing staff as tolerated for length of stay.  Patient tolerated sitting up in chair after therapy - RN notified.  Plan:  Patient discharged from physical therapy to care of nursing for ambulation daily as tolerated for length of stay.         Recommendations for follow up therapy are one component of a multi-disciplinary discharge planning process, led by the attending physician.  Recommendations may be updated based on patient status, additional functional criteria and insurance authorization.  Follow Up Recommendations No PT follow up    Assistance Recommended at Discharge PRN  Patient can return home with the following  Other (comment) (near baseline)    Equipment Recommendations None recommended by PT  Recommendations for Other Services       Functional Status Assessment Patient has not had a recent decline in their functional status     Precautions / Restrictions Precautions Precautions: None Restrictions Weight Bearing Restrictions: No      Mobility  Bed Mobility Overal bed mobility: Modified Independent                  Transfers Overall transfer level: Modified independent                      Ambulation/Gait Ambulation/Gait assistance: Modified independent (Device/Increase time) Gait Distance (Feet): 200 Feet Assistive device: None Gait Pattern/deviations: WFL(Within Functional Limits) Gait velocity: slightly decreased      General Gait Details: grossly WFL with good return demonstrated for ambulation in room and hallways without loss of balance  Stairs            Wheelchair Mobility    Modified Rankin (Stroke Patients Only)       Balance Overall balance assessment: No apparent balance deficits (not formally assessed)                                           Pertinent Vitals/Pain Pain Assessment: No/denies pain    Home Living Family/patient expects to be discharged to:: Private residence Living Arrangements: Other relatives Available Help at Discharge: Family;Available PRN/intermittently Type of Home: House Home Access: Stairs to enter Entrance Stairs-Rails: Right Entrance Stairs-Number of Steps: 5 Alternate Level Stairs-Number of Steps: Patient states he does not go upstairs Home Layout: Two level;Able to live on main level with bedroom/bathroom;Full bath on main level Home Equipment: Advice worker (2 wheels)      Prior Function Prior Level of Function : Independent/Modified Independent             Mobility Comments: Hydrographic surveyor, drives ADLs Comments: Independent     Hand Dominance   Dominant Hand: Right    Extremity/Trunk Assessment   Upper Extremity Assessment Upper Extremity Assessment: Overall WFL for tasks assessed    Lower Extremity Assessment Lower Extremity Assessment: Overall WFL for tasks assessed    Cervical / Trunk  Assessment Cervical / Trunk Assessment: Normal  Communication   Communication: No difficulties  Cognition Arousal/Alertness: Awake/alert Behavior During Therapy: WFL for tasks assessed/performed Overall Cognitive Status: Within Functional Limits for tasks assessed                                          General Comments      Exercises     Assessment/Plan    PT Assessment Patient does not need any further PT services  PT Problem List         PT Treatment Interventions       PT Goals (Current goals can be found in the Care Plan section)  Acute Rehab PT Goals Patient Stated Goal: return home with family to assist PT Goal Formulation: With patient Time For Goal Achievement: 12/09/21 Potential to Achieve Goals: Good    Frequency       Co-evaluation               AM-PAC PT "6 Clicks" Mobility  Outcome Measure Help needed turning from your back to your side while in a flat bed without using bedrails?: None Help needed moving from lying on your back to sitting on the side of a flat bed without using bedrails?: None Help needed moving to and from a bed to a chair (including a wheelchair)?: None Help needed standing up from a chair using your arms (e.g., wheelchair or bedside chair)?: None Help needed to walk in hospital room?: None Help needed climbing 3-5 steps with a railing? : None 6 Click Score: 24    End of Session Equipment Utilized During Treatment: Oxygen Activity Tolerance: Patient tolerated treatment well Patient left: in chair;with call bell/phone within reach Nurse Communication: Mobility status PT Visit Diagnosis: Unsteadiness on feet (R26.81);Other abnormalities of gait and mobility (R26.89);Muscle weakness (generalized) (M62.81)    Time: 2836-6294 PT Time Calculation (min) (ACUTE ONLY): 23 min   Charges:   PT Evaluation $PT Eval Moderate Complexity: 1 Mod PT Treatments $Therapeutic Activity: 23-37 mins        3:51 PM, 12/09/21 Lonell Grandchild, MPT Physical Therapist with [facility] [phone] office [phone] mobile phone

## 2021-12-09 NOTE — Anesthesia Procedure Notes (Signed)
Procedure Name: Intubation Date/Time: 12/09/2021 9:00 AM Performed by: Minerva Ends, CRNA Pre-anesthesia Checklist: Patient identified, Emergency Drugs available, Suction available and Patient being monitored Patient Re-evaluated:Patient Re-evaluated prior to induction Oxygen Delivery Method: Circle system utilized Preoxygenation: Pre-oxygenation with 100% oxygen Induction Type: IV induction Ventilation: Mask ventilation without difficulty Laryngoscope Size: Mac and 3 Grade View: Grade I Tube type: Oral Tube size: 7.0 mm Number of attempts: 1 Airway Equipment and Method: Stylet and Oral airway Placement Confirmation: ETT inserted through vocal cords under direct vision, positive ETCO2 and breath sounds checked- equal and bilateral Secured at: 23 cm Tube secured with: Tape Dental Injury: Teeth and Oropharynx as per pre-operative assessment

## 2021-12-09 NOTE — Transfer of Care (Signed)
Immediate Anesthesia Transfer of Care Note  Patient: Michael Evans  Procedure(s) Performed: HERNIA REPAIR INGUINAL ADULT WITH MESH (Left: Inguinal)  Patient Location: PACU  Anesthesia Type:General  Level of Consciousness: awake, alert  and oriented  Airway & Oxygen Therapy: Patient connected to nasal cannula oxygen  Post-op Assessment: Report given to RN and Post -op Vital signs reviewed and stable  Post vital signs: Reviewed and stable  Last Vitals:  Vitals Value Taken Time  BP    Temp    Pulse 53 12/09/21 1014  Resp 16 12/09/21 1014  SpO2 99 % 12/09/21 1014  Vitals shown include unvalidated device data.  Last Pain:  Vitals:   12/09/21 0657  PainSc: 0-No pain         Complications: No notable events documented.

## 2021-12-09 NOTE — Anesthesia Preprocedure Evaluation (Addendum)
Anesthesia Evaluation  Patient identified by MRN, date of birth, ID band Patient awake    Reviewed: Allergy & Precautions, H&P , NPO status , Patient's Chart, lab work & pertinent test results, reviewed documented beta blocker date and time   Airway Mallampati: II  TM Distance: >3 FB Neck ROM: full    Dental no notable dental hx. (+) Edentulous Upper, Edentulous Lower   Pulmonary sleep apnea , COPD, former smoker,    Pulmonary exam normal breath sounds clear to auscultation       Cardiovascular Exercise Tolerance: Good hypertension, negative cardio ROS   Rhythm:regular Rate:Normal     Neuro/Psych negative neurological ROS  negative psych ROS   GI/Hepatic negative GI ROS, Neg liver ROS,   Endo/Other  negative endocrine ROS  Renal/GU negative Renal ROS  negative genitourinary   Musculoskeletal   Abdominal   Peds  Hematology negative hematology ROS (+)   Anesthesia Other Findings Pt. With chronic productive cough, COPD, home O2.  However, hernia continues to incarcerate.  Does not appear that there will be a better time to fix this, and the benefit of fixing the hernia outweigh the current risks.  Plan for post-op ICU step-down.  Reproductive/Obstetrics negative OB ROS                            Anesthesia Physical Anesthesia Plan  ASA: 3  Anesthesia Plan: General and General LMA   Post-op Pain Management:    Induction:   PONV Risk Score and Plan: Ondansetron  Airway Management Planned:   Additional Equipment:   Intra-op Plan:   Post-operative Plan:   Informed Consent: I have reviewed the patients History and Physical, chart, labs and discussed the procedure including the risks, benefits and alternatives for the proposed anesthesia with the patient or authorized representative who has indicated his/her understanding and acceptance.     Dental Advisory Given  Plan Discussed  with: CRNA  Anesthesia Plan Comments:         Anesthesia Quick Evaluation

## 2021-12-09 NOTE — Progress Notes (Signed)
Medical Consultation   Michael Evans  MHD:[id number]  DOB: Feb 03, 1941  DOA: 12/09/2021  PCP: [name], MD   Outpatient Specialists: Pulmonologist (Dr. Melvyn Novas).  Requesting physician: Dr. Constance Haw  Reason for consultation: Assistant with chronic medical management.   History of Present Illness: Michael Evans is an 81 y.o. male chronic respiratory failure with hypoxia, COPD, gastroesophageal reflux disease, hypertension, history of chronic rhinitis, obstructive sleep apnea (wearing oxygen at nighttime); who presented to the hospital for elective inguinal hernia repair.  Status postsurgery given medical problems [facility] and pulmonology service has been consulted to further assist with patient management.  Procedure has been otherwise well tolerated.  Minimal pain reported by patient.  Good saturation on 2-3 L nasal cannula supplementation appreciated.  Patient is afebrile, no chest pain, no nausea, no vomiting, no dysuria or hematuria.  Review of Systems:   As per HPI otherwise 10 point review of systems negative.     Past Medical History: Past Medical History:  Diagnosis Date   Arthritis    Chronic rhinitis    COPD (chronic obstructive pulmonary disease) ([place])    PFT 06-26-09 FEV1  1.75( 71%), FVC 3.65( 101%), FEV1% 48, TLC 5.53(104%), DLCO 55%, no BD   Hypertension    Nasal septal deviation    Nocturia    On home O2    2L N/C   OSA (obstructive sleep apnea) 04/21/2011   Skin cancer     Past Surgical History: Past Surgical History:  Procedure Laterality Date   APPENDECTOMY     BACK SURGERY     CATARACT EXTRACTION W/PHACO  01/06/2013   Procedure: CATARACT EXTRACTION PHACO AND INTRAOCULAR LENS PLACEMENT ([place]);  Surgeon: Tonny Branch, MD;  Location: [facility];  Service: Ophthalmology;  Laterality: Right;  CDE: 22.17   Allergies:   Allergies  Allergen Reactions   Amlodipine Swelling    Swelling in feet   Aspirin Other (See Comments)    Upset stomach.   Other  Hypertension    Hazelnuts   Penicillins Hives    Has patient had a PCN reaction causing immediate rash, facial/tongue/throat swelling, SOB or lightheadedness with hypotension: Yes Has patient had a PCN reaction causing severe rash involving mucus membranes or skin necrosis: No Has patient had a PCN reaction that required hospitalization No Has patient had a PCN reaction occurring within the last 10 years: No If all of the above answers are "NO", then may proceed with Cephalosporin use.    Sulfa Antibiotics Hives     Social History:  reports that he quit smoking about 20 years ago. His smoking use included cigarettes. He has a 81.00 pack-year smoking history. He has never used smokeless tobacco. He reports that he does not drink alcohol and does not use drugs.   Family History: Family History  Problem Relation Age of Onset   Lung cancer Mother    Physical Exam: Vitals:   12/09/21 1600 12/09/21 1634 12/09/21 1700 12/09/21 1800  BP:      Pulse: 75  75 78  Resp: 17  17 19   Temp:  97.8 F (36.6 C)    TempSrc:  Oral    SpO2: 98%  97% 94%  Weight:      Height:        Constitutional: Chronically ill in appearance; alert, awake and oriented x3; in no major distress.  Good saturation with current supplementation.  Denies chest pain  or palpitations. Eyes: PERLA, EOMI, irises appear normal, anicteric sclera, no icterus, no nystagmus. ENMT: external ears and nose appear normal, normal hearing, lips appears normal, oropharynx mucosa, tongue, posterior pharynx appear normal  Neck: neck appears normal, no masses, normal ROM, no thyromegaly, no JVD. CVS: S1-S2 clear, no murmur rubs or gallops, no LE edema, normal pedal pulses. Respiratory: Positive scattered rhonchi it, minimal expiratory wheezing appreciated in his upper lobes region; no using accessory muscle.  No tachypnea.  Normal respiratory effort. Abdomen: soft, no guarding, positive bowel sounds.Wound dressings clean and  dry; Musculoskeletal: : no cyanosis, clubbing or edema noted bilaterally; good range of motion. Neuro: Cranial nerves II-XII intact, strength, sensation, reflexes Psych: judgement and insight appear normal, stable mood and affect, mental status Skin: no rashes or petechiae.  Data reviewed:  I have personally reviewed following labs and imaging studies  Urinalysis    Component Value Date/Time   COLORURINE YELLOW 10/17/2021 [place] 10/17/2021 2320   LABSPEC 1.011 10/17/2021 2320   PHURINE 6.0 10/17/2021 2320   GLUCOSEU NEGATIVE 10/17/2021 2320   HGBUR NEGATIVE 10/17/2021 2320   [place] 10/17/2021 2320   KETONESUR NEGATIVE 10/17/2021 2320   PROTEINUR NEGATIVE 10/17/2021 2320   NITRITE NEGATIVE 10/17/2021 2320   LEUKOCYTESUR NEGATIVE 10/17/2021 2320   Microbiology Recent Results (from the past 240 hour(s))  SARS CORONAVIRUS 2 (TAT 6-24 HRS) Nasopharyngeal Nasopharyngeal Swab     Status: None   Collection Time: 12/06/21 10:21 AM   Specimen: Nasopharyngeal Swab  Result Value Ref Range Status   SARS Coronavirus 2 NEGATIVE NEGATIVE Final    Comment: (NOTE) SARS-CoV-2 target nucleic acids are NOT DETECTED.  The SARS-CoV-2 RNA is generally detectable in upper and lower respiratory specimens during the acute phase of infection. Negative results do not preclude SARS-CoV-2 infection, do not rule out co-infections with other pathogens, and should not be used as the sole basis for treatment or other patient management decisions. Negative results must be combined with clinical observations, patient history, and epidemiological information. The expected result is Negative.  Fact Sheet for Patients: SugarRoll.be  Fact Sheet for Healthcare Providers: https://www.woods-mathews.com/  This test is not yet approved or cleared by the [place] FDA and  has been authorized for detection and/or diagnosis of SARS-CoV-2  by FDA under an Emergency Use Authorization (EUA). This EUA will remain  in effect (meaning this test can be used) for the duration of the COVID-19 declaration under Se ction 564(b)(1) of the Act, 21 U.S.C. section 360bbb-3(b)(1), unless the authorization is terminated or revoked sooner.  Performed at [facility], [place] [address], [place], [place] [address]   MRSA Next Gen by PCR, Nasal     Status: None   Collection Time: 12/09/21  1:38 PM   Specimen: Nasal Mucosa; Nasal Swab  Result Value Ref Range Status   MRSA by PCR Next Gen NOT DETECTED NOT DETECTED Final    Comment: (NOTE) The GeneXpert MRSA Assay (FDA approved for NASAL specimens only), is one component of a comprehensive MRSA colonization surveillance program. It is not intended to diagnose MRSA infection nor to guide or monitor treatment for MRSA infections. Test performance is not FDA approved in patients less than 70 years old. Performed at [facility], [address] [place], [place] [address]     Inpatient Medications:   Scheduled Meds:  [START ON 12/10/2021] vitamin C  1,000 mg Oral Daily   Chlorhexidine Gluconate Cloth  6 each Topical Daily   [  START ON 12/10/2021] cholecalciferol  3,000 Units Oral Daily   dextromethorphan-guaiFENesin  2 tablet Oral BID   docusate sodium  100 mg Oral BID   heparin  5,000 Units Subcutaneous Q8H   mometasone-formoterol  2 puff Inhalation BID   And   [START ON 12/10/2021] umeclidinium bromide  1 puff Inhalation Daily   [START ON 12/10/2021] multivitamin with minerals  1 tablet Oral Daily   pantoprazole (PROTONIX) IV  40 mg Intravenous QHS   [START ON 12/10/2021] pyridOXINE  100 mg Oral Daily   [START ON 12/10/2021] cyanocobalamin  1,000 mcg Oral Daily   Continuous Infusions:   Radiological Exams on Admission: No results found.  Impression/Recommendations 1-left inguinal hernia -Status post surgical repair with mesh. -Overall procedure well tolerated; continue to follow  postoperative recommendations by general surgery.  2-COPD GOLD 2 -Positive scattered rhonchi on exam; no active wheezing. -Chronically uses 2 L -After surgery 3 L nasal cannula supplementation in place; instructions given to wean off as tolerated towards baseline. -Appreciate assistance and recommendation by pulmonology service. -Continue as needed bronchodilators and the use of BEZTRI equivalent. -Flutter valve and incentive spirometer recommended.  3-Chronic respiratory failure with hypoxia ([facility]) -Chronically using 2 L -Continue supplementation and wean off to baseline -Continue as needed bronchodilators. -In no major distress currently.  4-Upper airway cough syndrome -Chronic and intermittent -Continue as needed Mucinex and supportive care. -Incentive spirometer and flutter valve recommended.  5-gastroesophageal flux disease -Continue PPI.  6-essential hypertension -Overall controlled with diet -As needed metoprolol has been ordered; agree with management -At this moment elevation in his blood pressure can be associated with pain. -Continue to follow vital signs.  Thank you for this consultation.  Our [facility] hospitalist team will follow the patient with you.   Time Spent: 72 minutes  Barton Dubois M.D. [facility] 12/09/2021, 7:31 PM

## 2021-12-09 NOTE — Anesthesia Postprocedure Evaluation (Signed)
Anesthesia Post Note  Patient: Michael Evans  Procedure(s) Performed: HERNIA REPAIR INGUINAL ADULT WITH MESH (Left: Inguinal)  Patient location during evaluation: Phase II Anesthesia Type: General Level of consciousness: awake Pain management: pain level controlled Vital Signs Assessment: post-procedure vital signs reviewed and stable Respiratory status: spontaneous breathing and respiratory function stable Cardiovascular status: blood pressure returned to baseline and stable Postop Assessment: no headache and no apparent nausea or vomiting Anesthetic complications: no Comments: Late entry   No notable events documented.   Last Vitals:  Vitals:   12/09/21 0707 12/09/21 0730  BP: (!) 169/70 (!) 155/72  Pulse: 62 (!) 57  Resp:  17  Temp: 36.8 C   SpO2: 100% 98%    Last Pain:  Vitals:   12/09/21 0657  PainSc: 0-No pain                 Louann Sjogren

## 2021-12-09 NOTE — Op Note (Addendum)
[facility] Operative Note  12/09/21  Preoperative Diagnosis: Left inguinal hernia    Postoperative Diagnosis: Left Pantaloon hernia (direct and indirect)    Procedure(s) Performed: Left inguinal hernia repair with mesh   Surgeon: Lanell Matar. Constance Haw, MD   Assistants: No qualified resident was available   Anesthesia: General endotracheal   Anesthesiologist: Louann Sjogren, MD    Specimens: None    Estimated Blood Loss: Minimal   Blood Replacement: None    Complications: None    Wound Class:Clean    Operative Indications: Mr. Masi is a 82 yo with COPD on O2, who is high risk from a pulmonary standpoint but has been seen by Pulmonary for risk stratification and optimization given that he has this hernia that has been incarcerated and causing him significant issues. We discussed that he is high risk from a pulmonary standpoint and risk of the hernia repair including bleeding, infection, nerve or cord injury, use of mesh, and need for post operative monitoring given his history.   Findings: Indirect and direct hernia    Procedure: The patient was taken to the operating room and placed supine. General endotracheal anesthesia was induced. Intravenous antibiotics were  administered per protocol.  A time out was preformed verifying the correct patient, procedure, site, positioning and implants.  The left groin and scrotum were prepared and draped in the usual sterile fashion.   An incision was made in a natural skin crease between the pubic tubercle and the anterior superior iliac spine.  The incision was deepened with electrocautery through Scarpa's and Camper's fascia until the aponeurosis of the external oblique was encountered.  This was cleaned and the external ring was exposed.  An incision was made in the midportion of the external oblique aponeurosis in the direction of its fibers. The ilioinguinal nerve was identified and was protected throughout the dissection.   Flaps of the external oblique were developed cephalad and inferiorly.    The cord was identified and it was gently dissented free at the pubic tubercle and encircled with a Penrose drain.  Attention was then directed at the anteromedial aspect of the cord, where an indirect hernia sac was identified.  The sac was carefully dissected free from the cord down to the level of the internal ring.  The vas and testicular vessels were identified and protected from harm.  Once the sac was dissected free from the cords, the Penrose was placed around the cord which was retracted inferiorly out of the field of view.  The hernia was reduced into the internal ring without difficulty.  A Medium Perfix Plug was placed into the defect and filled the space and was secured with a 2-0 Novafil.  Attention was then turned to the floor of the canal, which had a second hernia in the direct location which was less than 1.5cm in size and reducible.  This did not require a Plug but the fatty tissue was cleared up to ensure the area reduced completely.  The Perfix Mesh Patch was sutured to the inguinal ligament inferiorly starting at the pubic tubercle using 2-0 Novafil interrupted sutures.  The mesh was sutured superiorly to the conjoint tendon using 2-0 Novafil interrupted sutures.  Care was taken to ensure the mesh was placed in a relaxed fashion to avoid excessive tension and no neurovascular structures were caught in the repair.  Laterally the tails of the mesh were crossed and the internal ring was recreated, allowing for passage of cords without tension.  Hemostasis was adequate.  The Penrose was removed.  The external oblique aponeurosis was closed with a 2-0 Vicryl suture in a running fashion, taking care to not catch the ilioinguinal nerve in the suture line.  Scarpa's fashion was closed with 3-0 Vicryl interrupted sutures. The skin was closed with a subcuticular 4-0 Monocryl suture.  Dermabond was applied.  Robynn Pane had been  layered throughout the repair above.   The testis was gently pulled down into its anatomic position in the scrotum.  The patient tolerated the procedure well and was taken to the PACU in stable condition. All counts were correct at the end of the case.       Curlene Labrum, MD [facility] [address] [place], [place] [address] [phone] (office)

## 2021-12-09 NOTE — Progress Notes (Addendum)
[facility]  Updated his brother. Admission overnight to monitor given pulmonary risk. Did well with left inguinal hernia repair with mesh, Xaracoll used.   PRN For pain Ice to area Home Respiratory meds ordered O2 @ baseline Respiratory consult IS and ambulation Diet as tolerated BMP in the AM No fluids needed Will need to void post hernia repair, PACU aware, if goes many hours, Step down RN to let me know  Scds, heparin sq tonight Hospitalist and Pulmonary consult given higher risk for assistance with post operative care, notified each  Curlene Labrum, MD [facility] [address] [place], [place] [address] [phone] (office)

## 2021-12-09 NOTE — Interval H&P Note (Signed)
History and Physical Interval Note:  12/09/2021 8:33 AM  Michael Evans  has presented today for surgery, with the diagnosis of LEFT INGUINAL HERNIA.  The various methods of treatment have been discussed with the patient and family. After consideration of risks, benefits and other options for treatment, the patient has consented to  Procedure(s): HERNIA REPAIR INGUINAL ADULT WITH MESH (Left) as a surgical intervention.  The patient's history has been reviewed, patient examined, no change in status, stable for surgery.  I have reviewed the patient's chart and labs.  Questions were answered to the patient's satisfaction.   Has seen pulmonary, high risk but patient understands, will do everything to mitigate risk, anesthesia aware and on board, plan for Step down admission post op and will get hospitalist and pulmonary to consult given his risk.    [name]

## 2021-12-09 NOTE — Progress Notes (Signed)
NAME:  Michael Evans, MRN:  [id number], DOB:  May 12, 1941, LOS: 0 ADMISSION DATE:  12/09/2021, CONSULTATION DATE:  1/9 REFERRING MD:  Constance Haw, CHIEF COMPLAINT:  post op resp failure   History of Present Illness:  69 yom remote smoker with GOLD 2/02 dep copd with tendency to upper airway cough seen post op p Left inguinal hernia repair with mesh 1/9 by Dr Constance Haw   Baseline on 2lpm cont doe x room to room / variable severe upper airway pattern cough now with mucoid thick sputum s cp or sob at rest post op, denies any problem with active sinus complaints or dysphagia.      Significant Hospital Events: Including procedures, antibiotic start and stop dates in addition to other pertinent events   Left inguinal hernia repair with mesh am 1/9   Interim History / Subjective:  Able to lie back at 30 degrees s distress   Objective   Blood pressure (!) 143/70, pulse (!) 58, temperature 97.8 F (36.6 C), resp. rate 16, SpO2 98 %.        Intake/Output Summary (Last 24 hours) at 12/09/2021 1041 Last data filed at 12/09/2021 1011 Gross per 24 hour  Intake 800 ml  Output 0 ml  Net 800 ml   There were no vitals filed for this visit.  Examination:  Tmax 98.2   General appearance:    Elderly wm/ mariginal cough mechanics lying back at 30 degrees comfortably in PACU  At Rest 02 sats  97% on 2lpm   No jvd Oropharynx clear,  mucosa nl Neck supple Lungs with a few scattered exp > insp rhonchi bilaterally RRR no s3 or or sign murmur Abd wound dressings clean and dry, limited  excursion  Extr warm with no edema or clubbing noted Neuro  Sensorium intact,  no apparent motor deficits     I personally reviewed images and agree with radiology impression as follows:  CXR:   pa and lateral 10/31/21 Emphysema without acute abnormality of the lungs.    Assessment & Plan:  COPD GOLD 2 Quit smoking 2003  - PFT's  05/14/15 FEV1 1.67 (61 % ) ratio 0.56   p 21 % improvement from saba p ? prior to study  with DLCO  10.51 (35%) corrects to 35% 2.4141 (54%)  for alv volume and FV curve only mildly concave   >>> Group D in terms of symptom/risk and laba/lama/ICS  therefore appropriate rx at this point >>>  breztri 2 bid or equivalent post op   Upper airway cough syndrome - MRI sinus 11/20/21 Minor mucosal thickening >>> max rx for gerd, mucinex dm 1200 mg bid/ flutter valve prn    Chronic respiratory failure with hypoxia ([place]) Long term rx s hypercarbia as of 10/31/2021   - 10/31/2021   Walked on 2lpm pulsed x  3  lap(s) =  approx 450 ft @ slow pace, stopped due to end of study s sob with lowest 02 sats 92% >>> titrate to sats mid 90s as not a retainer   >>> check in am 1/10     Best Practice (right click and "Reselect all SmartList Selections" daily)    Per surgery   Labs   CBC: No results for input(s): WBC, NEUTROABS, HGB, HCT, MCV, PLT in the last 168 hours.  Basic Metabolic Panel: No results for input(s): NA, K, CL, CO2, GLUCOSE, BUN, CREATININE, CALCIUM, MG, PHOS in the last 168 hours. GFR: Estimated Creatinine Clearance: 60.4 mL/min (by C-G formula based  on SCr of 0.82 mg/dL). No results for input(s): PROCALCITON, WBC, LATICACIDVEN in the last 168 hours.  Liver Function Tests: No results for input(s): AST, ALT, ALKPHOS, BILITOT, PROT, ALBUMIN in the last 168 hours. No results for input(s): LIPASE, AMYLASE in the last 168 hours. No results for input(s): AMMONIA in the last 168 hours.  ABG    Component Value Date/Time   HCO3 25.5 (H) 01/25/2008 1931   TCO2 27 01/25/2008 1931     Coagulation Profile: No results for input(s): INR, PROTIME in the last 168 hours.  Cardiac Enzymes: No results for input(s): CKTOTAL, CKMB, CKMBINDEX, TROPONINI in the last 168 hours.  HbA1C: Hgb A1c MFr Bld  Date/Time Value Ref Range Status  09/26/2021 05:38 AM 6.7 (H) 4.8 - 5.6 % Final    Comment:    (NOTE) Pre diabetes:          5.7%-6.4%  Diabetes:              >6.4%  Glycemic  control for   <7.0% adults with diabetes   11/11/2018 12:15 PM CANCELED      Comment:    TEST NOT PERFORMED . No lavender-top tube received.  Result canceled by the ancillary.     CBG: No results for input(s): GLUCAP in the last 168 hours.    Past Medical History:  He,  has a past medical history of Arthritis, Chronic rhinitis, COPD (chronic obstructive pulmonary disease) ([place]), Hypertension, Nasal septal deviation, Nocturia, On home O2, OSA (obstructive sleep apnea) (04/21/2011), and Skin cancer.   Surgical History:   Past Surgical History:  Procedure Laterality Date   APPENDECTOMY     BACK SURGERY     CATARACT EXTRACTION W/PHACO  01/06/2013   Procedure: CATARACT EXTRACTION PHACO AND INTRAOCULAR LENS PLACEMENT (IOC);  Surgeon: Tonny Branch, MD;  Location: [facility];  Service: Ophthalmology;  Laterality: Right;  CDE: 22.17     Social History:   reports that he quit smoking about 20 years ago. His smoking use included cigarettes. He has a 81.00 pack-year smoking history. He has never used smokeless tobacco. He reports that he does not drink alcohol and does not use drugs.   Family History:  His family history includes Lung cancer in his mother.   Allergies Allergies  Allergen Reactions   Amlodipine Swelling    Swelling in feet   Aspirin Other (See Comments)    Upset stomach.   Other Hypertension    Hazelnuts   Penicillins Hives    Has patient had a PCN reaction causing immediate rash, facial/tongue/throat swelling, SOB or lightheadedness with hypotension: Yes Has patient had a PCN reaction causing severe rash involving mucus membranes or skin necrosis: No Has patient had a PCN reaction that required hospitalization No Has patient had a PCN reaction occurring within the last 10 years: No If all of the above answers are "NO", then may proceed with Cephalosporin use.    Sulfa Antibiotics Hives     Home Medications  Prior to Admission medications   Medication Sig Start  Date End Date Taking? Authorizing Provider  albuterol (VENTOLIN HFA) 108 (90 Base) MCG/ACT inhaler Inhale 1-2 puffs into the lungs every 6 (six) hours as needed for wheezing or shortness of breath. 01/01/21  Yes [name], MD  Ascorbic Acid (VITAMIN C) 1000 MG tablet Take 1,000 mg by mouth daily.   Yes [provider]  Budeson-Glycopyrrol-Formoterol (BREZTRI AEROSPHERE) 160-9-4.8 MCG/ACT AERO Inhale 2 puffs into the lungs in the morning and  at bedtime.   Yes [provider]  cholecalciferol (VITAMIN D) 25 MCG (1000 UNIT) tablet Take 3,000 Units by mouth daily.   Yes [provider]  cyanocobalamin 2000 MCG tablet Take 1,000 mcg by mouth daily.   Yes [provider]  Multiple Vitamin (MULTIVITAMIN) tablet Take 1 tablet by mouth daily. Men's health   Yes [provider]  Nebulizer MISC 1 each by Does not apply route daily as needed. PLEASE PROVIDE NEBULIZER KIT TO INCLUDE TUBING/MASK/MOUTHPIECE 10/04/21  Yes [name], MD  Omega-3 Fatty Acids (FISH OIL CONCENTRATE PO) Take 1 capsule by mouth daily.   Yes [provider]  OXYGEN Inhale 2 L into the lungs continuous.   Yes [provider]  pyridoxine (B-6) 100 MG tablet Take 100 mg by mouth daily.   Yes [provider]  ondansetron (ZOFRAN ODT) 8 MG disintegrating tablet Take 1 tablet (8 mg total) by mouth every 8 (eight) hours as needed for nausea or vomiting. 10/19/21   Barton Dubois, MD      Christinia Gully, MD Pulmonary and [place] [phone]   After 7:00 pm call Elink  [phone]

## 2021-12-10 ENCOUNTER — Encounter (HOSPITAL_COMMUNITY): Payer: Self-pay | Admitting: General Surgery

## 2021-12-10 ENCOUNTER — Observation Stay (HOSPITAL_COMMUNITY): Payer: Medicare HMO

## 2021-12-10 DIAGNOSIS — K4090 Unilateral inguinal hernia, without obstruction or gangrene, not specified as recurrent: Secondary | ICD-10-CM

## 2021-12-10 DIAGNOSIS — I10 Essential (primary) hypertension: Secondary | ICD-10-CM | POA: Diagnosis not present

## 2021-12-10 DIAGNOSIS — R0902 Hypoxemia: Secondary | ICD-10-CM | POA: Diagnosis not present

## 2021-12-10 DIAGNOSIS — Z85828 Personal history of other malignant neoplasm of skin: Secondary | ICD-10-CM | POA: Diagnosis not present

## 2021-12-10 DIAGNOSIS — J449 Chronic obstructive pulmonary disease, unspecified: Secondary | ICD-10-CM | POA: Diagnosis not present

## 2021-12-10 DIAGNOSIS — J9690 Respiratory failure, unspecified, unspecified whether with hypoxia or hypercapnia: Secondary | ICD-10-CM | POA: Diagnosis not present

## 2021-12-10 DIAGNOSIS — Z87891 Personal history of nicotine dependence: Secondary | ICD-10-CM | POA: Diagnosis not present

## 2021-12-10 DIAGNOSIS — J9611 Chronic respiratory failure with hypoxia: Secondary | ICD-10-CM | POA: Diagnosis not present

## 2021-12-10 DIAGNOSIS — R058 Other specified cough: Secondary | ICD-10-CM | POA: Diagnosis not present

## 2021-12-10 LAB — BASIC METABOLIC PANEL
Anion gap: 6 (ref 5–15)
BUN: 19 mg/dL (ref 8–23)
CO2: 26 mmol/L (ref 22–32)
Calcium: 8.8 mg/dL — ABNORMAL LOW (ref 8.9–10.3)
Chloride: 105 mmol/L (ref 98–111)
Creatinine, Ser: 1.02 mg/dL (ref 0.61–1.24)
GFR, Estimated: >60 mL/min (ref >60)
Glucose, Bld: 129 mg/dL — ABNORMAL HIGH (ref 70–99)
Potassium: 4 mmol/L (ref 3.5–5.1)
Sodium: 137 mmol/L (ref 135–145)

## 2021-12-10 MED ORDER — IBUPROFEN 400 MG PO TABS: 600.0000 mg | ORAL_TABLET | ORAL | Status: DC | PRN

## 2021-12-10 MED ORDER — DM-GUAIFENESIN ER 30-600 MG PO TB12: 2.0000 | ORAL_TABLET | Freq: Two times a day (BID) | ORAL | 0 refills | Status: DC | PRN

## 2021-12-10 MED ORDER — OXYCODONE HCL 5 MG PO TABS: 5.0000 mg | ORAL_TABLET | ORAL | 0 refills | Status: DC | PRN

## 2021-12-10 NOTE — TOC Progression Note (Signed)
Transition of Care [facility]) - Progression Note    Patient Details  Name: Michael Evans MRN: [id number] Date of Birth: August 23, 1941  Transition of Care [facility]) CM/SW Contact  Salome Arnt, [place] Phone Number: 12/10/2021, 7:59 AM  Clinical Narrative:  TOC received consult for medication assistance. Pt has [employer] and Medicaid. He states his insurance won't pay for some breathing treatments which are prescribed by his PCP. LCSW suggested pt discuss with PCP so he can prescribe ones that are covered by Medicaid. Pt is aware of $4 copays for Medicaid covered prescriptions. No other needs reported at this time. TOC will continue to follow.           Expected Discharge Plan and Services                                                 Social Determinants of Health (SDOH) Interventions    Readmission Risk Interventions No flowsheet data found.

## 2021-12-10 NOTE — Progress Notes (Signed)
Patient given discharge instructions, and flutter valve. Verbalized understanding. 2 peripheral ivs removed. Patient's brother at bedside. Patient's home oxygen at bedside. Patient taken down via wheelchair to main entrance to be discharged home.

## 2021-12-10 NOTE — Progress Notes (Signed)
PROGRESS NOTE    Michael Evans  ALP:[id number] DOB: 04-14-1941 DOA: 12/09/2021 PCP: [name], MD    CC: Left inguinal hernia repair.  Brief Narrative:  Michael Evans is an 81 y.o. male chronic respiratory failure with hypoxia, COPD, gastroesophageal reflux disease, hypertension, history of chronic rhinitis, obstructive sleep apnea (wearing oxygen at nighttime); who presented to the hospital for elective inguinal hernia repair.  Status postsurgery given medical problems TRH and pulmonology service has been consulted to further assist with patient management.  Procedure has been otherwise well tolerated.  Minimal pain reported by patient.  Good saturation on 2-3 L nasal cannula supplementation appreciated.  Patient is afebrile, no chest pain, no nausea, no vomiting, no dysuria or hematuria.  Assessment & Plan: 1-left inguinal hernia -Status post surgical repair with mesh. -Overall procedure well tolerated; continue to follow postoperative recommendations by general surgery. -Patient reports complaining of pain especially while coughing.  2-COPD GOLD 2 -Positive scattered rhonchi on exam; no active wheezing. -Chronically uses 2 L -Good saturation on chronic supplementation; no wheezing on exam.  Reports intermittent coughing spells. -Appreciate assistance and recommendation by pulmonology service. -Continue as needed bronchodilators and the use of BEZTRI equivalent and the use of antitussive/mucolytic medications. -Flutter valve and incentive spirometer recommended.  3-Chronic respiratory failure with hypoxia (HCC) -Chronically using 2 L -Saturation stable with chronic supplementation at this time. -Continue as needed bronchodilators. -In no major distress currently.  4-Upper airway cough syndrome -Chronic and intermittent -Continue as needed Mucinex and antitussive regimen. -Incentive spirometer and flutter valve recommended.  5-gastroesophageal flux disease -Continue  PPI.  6-essential hypertension -Overall controlled with diet -As needed metoprolol has been ordered; agree with management -At this moment elevation in his blood pressure can be associated with pain. -Continue to follow vital signs.    DVT prophylaxis: Heparin Code Status: Full code. Family Communication: No family at bedside. Disposition: Safe to discharge from internal medicine standpoint.   Procedures:  S/p left inguinal hernia repair with mesh.  Antimicrobials:    Subjective: Afebrile, complaining of intermittent coughing spells and pain in his left lower quadrant/inguinal area around incisional wound when coughing.  2 L nasal cannula supplementation with good saturation.  Reports no chest pain, no nausea, no vomiting, no palpitations.  Objective: Vitals:   12/10/21 1112 12/10/21 1200 12/10/21 1300 12/10/21 1400  BP:  (!) 123/42 116/60 (!) 103/48  Pulse: (!) 58 71 (!) 57 (!) 56  Resp: 16 16  18   Temp: 97.8 F (36.6 C)     TempSrc: Oral     SpO2: 96% 94%  95%  Weight:      Height:        Intake/Output Summary (Last 24 hours) at 12/10/2021 1744 Last data filed at 12/10/2021 1205 Gross per 24 hour  Intake 240 ml  Output 1175 ml  Net -935 ml   Filed Weights   12/09/21 1346 12/10/21 0500  Weight: 60.9 kg 62.7 kg    Examination:  General exam: Afebrile, no chest pain, no nausea, no vomiting.  Reports left lower quadrant discomfort around incisional wound with coughing spells. Respiratory system: Positive rhonchi bilaterally; mild expiratory wheezing.  No using accessory muscle.  Positive productive cough intermittently present (mucus expectorated). Cardiovascular system: S1 & S2 heard, RRR. No JVD, murmurs, rubs, gallops or clicks. No lower extremity edema. Gastrointestinal system: Abdomen is nondistended, soft and mildly tender to palpation around incisional wound in his left lower quadrant aspect.  Wound is clean, dry and without  erythematous changes Central  nervous system: Alert and oriented. No focal neurological deficits. Extremities: No cyanosis or clubbing. Skin: No petechiae. Psychiatry: Judgement and insight appear normal. Mood & affect appropriate.     Data Reviewed: I have personally reviewed following labs and imaging studies  CBC: No results for input(s): WBC, NEUTROABS, HGB, HCT, MCV, PLT in the last 168 hours.  Basic Metabolic Panel: Recent Labs  Lab 12/10/21 0359  NA 137  K 4.0  CL 105  CO2 26  GLUCOSE 129*  BUN 19  CREATININE 1.02  CALCIUM 8.8*    GFR: Estimated Creatinine Clearance: 48.5 mL/min (by C-G formula based on SCr of 1.02 mg/dL).   Recent Results (from the past 240 hour(s))  SARS CORONAVIRUS 2 (TAT 6-24 HRS) Nasopharyngeal Nasopharyngeal Swab     Status: None   Collection Time: 12/06/21 10:21 AM   Specimen: Nasopharyngeal Swab  Result Value Ref Range Status   SARS Coronavirus 2 NEGATIVE NEGATIVE Final    Comment: (NOTE) SARS-CoV-2 target nucleic acids are NOT DETECTED.  The SARS-CoV-2 RNA is generally detectable in upper and lower respiratory specimens during the acute phase of infection. Negative results do not preclude SARS-CoV-2 infection, do not rule out co-infections with other pathogens, and should not be used as the sole basis for treatment or other patient management decisions. Negative results must be combined with clinical observations, patient history, and epidemiological information. The expected result is Negative.  Fact Sheet for Patients: SugarRoll.be  Fact Sheet for Healthcare Providers: https://www.woods-mathews.com/  This test is not yet approved or cleared by the [place] FDA and  has been authorized for detection and/or diagnosis of SARS-CoV-2 by FDA under an Emergency Use Authorization (EUA). This EUA will remain  in effect (meaning this test can be used) for the duration of the COVID-19 declaration under Se ction 564(b)(1)  of the Act, 21 U.S.C. section 360bbb-3(b)(1), unless the authorization is terminated or revoked sooner.  Performed at [facility], [place] [address] [place], [place] [address]   MRSA Next Gen by PCR, Nasal     Status: None   Collection Time: 12/09/21  1:38 PM   Specimen: Nasal Mucosa; Nasal Swab  Result Value Ref Range Status   MRSA by PCR Next Gen NOT DETECTED NOT DETECTED Final    Comment: (NOTE) The GeneXpert MRSA Assay (FDA approved for NASAL specimens only), is one component of a comprehensive MRSA colonization surveillance program. It is not intended to diagnose MRSA infection nor to guide or monitor treatment for MRSA infections. Test performance is not FDA approved in patients less than 36 years old. Performed at [facility], [address], [place], [place] [address]     Radiology Studies: [facility] 1 View  Result Date: 12/10/2021 CLINICAL DATA:  Chronic respiratory failure with hypoxia EXAM: PORTABLE CHEST 1 VIEW COMPARISON:  10/31/2021 FINDINGS: Low volume chest with chronic streaky and fine opacities at the bases. Mildly elevated right diaphragm. There is no edema, consolidation, effusion, or pneumothorax. Normal heart size and mediastinal contours. IMPRESSION: 1. No acute finding when compared to prior. 2. COPD and lower lobe scarring. Electronically Signed   By: Jorje Guild M.D.   On: 12/10/2021 06:09     Scheduled Meds:  vitamin C  1,000 mg Oral Daily   Chlorhexidine Gluconate Cloth  6 each Topical Daily   cholecalciferol  3,000 Units Oral Daily   dextromethorphan-guaiFENesin  2 tablet Oral BID   docusate sodium  100 mg Oral BID  heparin  5,000 Units Subcutaneous Q8H   mometasone-formoterol  2 puff Inhalation BID   And   umeclidinium bromide  1 puff Inhalation Daily   multivitamin with minerals  1 tablet Oral Daily   pantoprazole (PROTONIX) IV  40 mg Intravenous QHS   pyridOXINE  100 mg Oral Daily   cyanocobalamin  1,000 mcg Oral Daily    Continuous Infusions:   LOS: 0 days    Barton Dubois, MD [facility]   To contact the attending provider between 7A-7P or the covering provider during after hours 7P-7A, please log into the web site [email or web address] and access using universal [place] password for that web site. If you do not have the password, please call the hospital operator.  12/10/2021, 5:44 PM

## 2021-12-10 NOTE — Discharge Instructions (Addendum)
Discharge Instructions Hernia:  Common Complaints: Pain at the incision site is common. This will improve with time. Take your pain medications as described below. Some nausea is common and poor appetite. The main goal is to stay hydrated the first few days after surgery.  Numbness at the incision or the thigh is common.  If you start to have burning or tingling pain in your groin, this is from a nerve being pinched. Please call and we can prescribe you a different type of pain medication for nerve pain.  Bruising is common. If you need to cough, use a pillow over the area to hold it down. You can take Mucinex for cough. Continue your normal respiratory medications per Pulmonary.  Use your flutter valve, to help keep your lungs open after surgery.    Diet/ Activity: Diet as tolerated. You may not have an appetite, but it is important to stay hydrated. Drink 64 ounces of water a day. Your appetite will return with time.  Shower per your regular routine daily.  Do not take hot showers. Take warm showers that are less than 10 minutes. Rest and listen to your body, but do not remain in bed all day.  Walk everyday for at least 15-20 minutes. Deep cough and move around every 1-2 hours in the first few days after surgery.  Do not pick at the dermabond glue on your incision sites.  This glue film will remain in place for 1-2 weeks and will start to peel off.  Do not place lotions or balms on your incision unless instructed to specifically by Dr. Constance Haw.  Do not lift > 10 lbs, perform excessive bending, pushing, pulling, squatting for 6-8 weeks after surgery.   Pain Expectations and Narcotics: -After surgery you will have pain associated with your incisions and this is normal. The pain is muscular and nerve pain, and will get better with time. -You are encouraged and expected to take non narcotic medications like tylenol and ibuprofen (when able) to treat pain as multiple modalities can aid with pain  treatment. -Narcotics are only used when pain is severe or there is breakthrough pain. -You are not expected to have a pain score of 0 after surgery, as we cannot prevent pain. A pain score of 3-4 that allows you to be functional, move, walk, and tolerate some activity is the goal. The pain will continue to improve over the days after surgery and is dependent on your surgery. -Due to [place] law, we are only able to give a certain amount of pain medication to treat post operative pain, and we only give additional narcotics on a patient by patient basis.  -For most laparoscopic surgery, studies have shown that the majority of patients only need 10-15 narcotic pills, and for open surgeries most patients only need 15-20.   -Having appropriate expectations of pain and knowledge of pain management with non narcotics is important as we do not want anyone to become addicted to narcotic pain medication.  -Using ice packs in the first 48 hours and heating pads after 48 hours, wearing an abdominal binder (when recommended), and using over the counter medications are all ways to help with pain management.   -Simple acts like meditation and mindfulness practices after surgery can also help with pain control and research has proven the benefit of these practices.  Medication: Take tylenol and ibuprofen as needed for pain control, alternating every 4-6 hours.  Example:  Tylenol 1000mg  @ 6am, 12noon, 6pm, 11midnight (Do not  exceed 4000mg  of tylenol a day). Ibuprofen 800mg  @ 9am, 3pm, 9pm, 3am (Do not exceed 3600mg  of ibuprofen a day).  Take Roxicodone for breakthrough pain every 4 hours.  Take Colace for constipation related to narcotic pain medication. If you do not have a bowel movement in 2 days, take Miralax over the counter.  Drink plenty of water to also prevent constipation.   Contact Information: If you have questions or concerns, please call our office, [phone], Monday- Thursday 8AM-5PM and Friday  8AM-12Noon.  If it is after hours or on the weekend, please call [facility] Main Number, [phone], [phone], and ask to speak to the surgeon on call for Dr. Constance Haw at [facility].

## 2021-12-10 NOTE — Discharge Summary (Signed)
Physician Discharge Summary  Patient ID: Michael Evans MRN: [id number] DOB/AGE: 1941-10-13 81 y.o.  Admit date: 12/09/2021 Discharge date: 12/10/2021  Admission Diagnoses: Left inguinal hernia   Discharge Diagnoses:  Principal Problem:   Left inguinal hernia Active Problems:   COPD GOLD 2   Chronic respiratory failure with hypoxia (HCC)   Upper airway cough syndrome   Discharged Condition: good  Hospital Course: Mr. Imperato was admitted after his left inguinal hernia repair given his high risk. He was doing well post operatively and was at his baseline from his pulmonary standpoint. He was ambulating, worked with PT, and had pain control. He was ready for dc and had been seen by Pulmonary prior to his discharge who agreed.   Consults:  Hospitalist and Pulmonary  Significant Diagnostic Studies: CXR no acute abnormality  Treatments: 12/09/2021 L inguinal hernia repair with mesh   Discharge Exam: Blood pressure 116/60, pulse (!) 57, temperature 97.8 F (36.6 C), temperature source Oral, resp. rate 16, height 5' 4.96" (1.65 m), weight 62.7 kg, SpO2 94 %. General appearance: alert and no distress Resp: normal work of breathing, 2L [place] [facility]: left inguinal incision cold to touch from ice, redness on lateral side from ice pack, no signs of infection or extending erythema, dermabond intact  Disposition: Discharge disposition: 01-Home or Self Care       Discharge Instructions     Call MD for:  difficulty breathing, headache or visual disturbances   Complete by: As directed    Call MD for:  extreme fatigue   Complete by: As directed    Call MD for:  persistant dizziness or light-headedness   Complete by: As directed    Call MD for:  persistant nausea and vomiting   Complete by: As directed    Call MD for:  redness, tenderness, or signs of infection (pain, swelling, redness, odor or green/yellow discharge around incision site)   Complete by: As directed    Call MD for:  severe  uncontrolled pain   Complete by: As directed    Call MD for:  temperature >100.4   Complete by: As directed    Increase activity slowly   Complete by: As directed       Allergies as of 12/10/2021       Reactions   Amlodipine Swelling   Swelling in feet   Aspirin Other (See Comments)   Upset stomach.   Other Hypertension   Hazelnuts   Penicillins Hives   Has patient had a PCN reaction causing immediate rash, facial/tongue/throat swelling, SOB or lightheadedness with hypotension: Yes Has patient had a PCN reaction causing severe rash involving mucus membranes or skin necrosis: No Has patient had a PCN reaction that required hospitalization No Has patient had a PCN reaction occurring within the last 10 years: No If all of the above answers are "NO", then may proceed with Cephalosporin use.   Sulfa Antibiotics Hives        Medication List     TAKE these medications    albuterol 108 (90 Base) MCG/ACT inhaler Commonly known as: VENTOLIN HFA Inhale 1-2 puffs into the lungs every 6 (six) hours as needed for wheezing or shortness of breath.   Breztri Aerosphere 160-9-4.8 MCG/ACT Aero Generic drug: Budeson-Glycopyrrol-Formoterol Inhale 2 puffs into the lungs in the morning and at bedtime.   cholecalciferol 25 MCG (1000 UNIT) tablet Commonly known as: VITAMIN D Take 3,000 Units by mouth daily.   cyanocobalamin 2000 MCG tablet Take 1,000 mcg by  mouth daily.   dextromethorphan-guaiFENesin 30-600 MG 12hr tablet Commonly known as: MUCINEX DM Take 2 tablets by mouth 2 (two) times daily as needed for cough (sputum).   FISH OIL CONCENTRATE PO Take 1 capsule by mouth daily.   multivitamin tablet Take 1 tablet by mouth daily. Men's health   Nebulizer Misc 1 each by Does not apply route daily as needed. PLEASE PROVIDE NEBULIZER KIT TO INCLUDE TUBING/MASK/MOUTHPIECE   ondansetron 8 MG disintegrating tablet Commonly known as: Zofran ODT Take 1 tablet (8 mg total) by mouth  every 8 (eight) hours as needed for nausea or vomiting.   oxyCODONE 5 MG immediate release tablet Commonly known as: Oxy IR/ROXICODONE Take 1 tablet (5 mg total) by mouth every 4 (four) hours as needed for severe pain or breakthrough pain.   OXYGEN Inhale 2 L into the lungs continuous.   pyridoxine 100 MG tablet Commonly known as: B-6 Take 100 mg by mouth daily.   vitamin C 1000 MG tablet Take 1,000 mg by mouth daily.        Follow-up Information     [name], MD Follow up on 01/07/2022.   Specialty: General Surgery Why: hernia check Contact information: [address] [place] [facility] [address] [phone]                 Signed: [name] 12/10/2021, 1:59 PM

## 2021-12-10 NOTE — Progress Notes (Signed)
[facility] Progress Note  1 Day Post-Op  Subjective: Walked with PT and did well. Moving and eating. Pain is minimal. Ice on the incision. Pulmonary and hospitalitist saw yesterday, appreciate assistance. CXR this Am at baseline. Says he is having some sputum.   Objective: Vital signs in last 24 hours: Temp:  [97.5 F (36.4 C)-98 F (36.7 C)] 97.8 F (36.6 C) (01/10 0714) Pulse Rate:  [55-104] 66 (01/10 0900) Resp:  [15-24] 16 (01/10 0900) BP: (97-162)/(43-135) 145/66 (01/10 0900) SpO2:  [91 %-99 %] 97 % (01/10 0900) Weight:  [60.9 kg-62.7 kg] 62.7 kg (01/10 0500)    Intake/Output from previous day: 01/09 0701 - 01/10 0700 In: 800 [I.V.:800] Out: 900 [Urine:900] Intake/Output this shift: Total I/O In: -  Out: 300 [Urine:300]  General appearance: alert and no distress GI: left inguinal incision healing, no drainage, mild erythema on lateral wound from ice as the skin is cold to the touch in that area, dermabond in place  Lab Results:  No results for input(s): WBC, HGB, HCT, PLT in the last 72 hours. BMET Recent Labs    12/10/21 0359  NA 137  K 4.0  CL 105  CO2 26  GLUCOSE 129*  BUN 19  CREATININE 1.02  CALCIUM 8.8*   PT/INR No results for input(s): LABPROT, INR in the last 72 hours.  Studies/Results: DG Chest Port 1 View  Result Date: 12/10/2021 CLINICAL DATA:  Chronic respiratory failure with hypoxia EXAM: PORTABLE CHEST 1 VIEW COMPARISON:  10/31/2021 FINDINGS: Low volume chest with chronic streaky and fine opacities at the bases. Mildly elevated right diaphragm. There is no edema, consolidation, effusion, or pneumothorax. Normal heart size and mediastinal contours. IMPRESSION: 1. No acute finding when compared to prior. 2. COPD and lower lobe scarring. Electronically Signed   By: Michael Evans M.D.   On: 12/10/2021 06:09    Anti-infectives: Anti-infectives (From admission, onward)    Start     Dose/Rate Route Frequency Ordered Stop    12/09/21 0700  ciprofloxacin (CIPRO) IVPB 400 mg        400 mg 200 mL/hr over 60 Minutes Intravenous On call to O.R. 12/09/21 0650 12/09/21 0909       Assessment/Plan: Patient POD 1 s/p left inguinal hernia, doing well. Says he does not want any narcotci pain meds and only wanting ibuprofen. Says tylenol upset his stomach.   Doing well from a surgical standpoint, ambulating eating. Will see what Dr. Melvyn Evans and Dr. Dyann Evans think about him dc home later today.  RN will let me know their thoughts. Will see patient in 4 weeks to check on hernia.    LOS: 0 days    Michael Evans 12/10/2021

## 2021-12-10 NOTE — Progress Notes (Signed)
NAME:  Michael Evans, MRN:  [id number], DOB:  1941-10-29, LOS: 0 ADMISSION DATE:  12/09/2021, CONSULTATION DATE:  1/9 REFERRING MD:  Constance Haw, CHIEF COMPLAINT:  post op resp failure   History of Present Illness:  16 yom remote smoker with GOLD 2/02 dep copd with tendency to upper airway cough seen post op p Left inguinal hernia repair with mesh 1/9 by Dr Constance Haw   Baseline on 2lpm cont doe x room to room / variable severe upper airway pattern cough now with mucoid thick sputum s cp or sob at rest post op, denies any problem with active sinus complaints or dysphagia.      Significant Hospital Events: Including procedures, antibiotic start and stop dates in addition to other pertinent events   Left inguinal hernia repair with mesh am 1/9   Interim History / Subjective:  Wound hurts when coughs (prior to pain med planned) but no sob   Objective   Blood pressure (!) 107/52, pulse 61, temperature 98 F (36.7 C), temperature source Oral, resp. rate 15, height 5' 4.96" (1.65 m), weight 62.7 kg, SpO2 94 %.        Intake/Output Summary (Last 24 hours) at 12/10/2021 0630 Last data filed at 12/09/2021 1411 Gross per 24 hour  Intake 800 ml  Output 500 ml  Net 300 ml   Filed Weights   12/09/21 1346 12/10/21 0500  Weight: 60.9 kg 62.7 kg    Examination:  Tmax  98.2  General appearance:    elderly wm nad at 30 degrees hob   At Rest 02 sats  94% on 1lpm   No jvd Oropharynx clear,  mucosa nl Neck supple Lungs with a few scattered exp > insp rhonchi bilaterally RRR no s3 or or sign murmur Abd soft with limited excursion / wound dressings clean and dy  Extr warm with no edema or clubbing noted Neuro  Sensorium intact ,  no apparent motor deficits     I personally reviewed images /mpression as follows:  CXR:   portable 1/10 No as dz no effusio or atx     Assessment & Plan:  COPD GOLD 2 Quit smoking 2003  - PFT's  05/14/15 FEV1 1.67 (61 % ) ratio 0.56   p 21 % improvement from saba  p ? prior to study with DLCO  10.51 (35%) corrects to 35% 2.4141 (54%)  for alv volume and FV curve only mildly concave   >>> Group D in terms of symptom/risk and laba/lama/ICS  therefore appropriate rx at this point >>>  breztri 2 bid  and prn saba   Upper airway cough syndrome - MRI sinus 11/20/21 Minor mucosal thickening >>> max rx for gerd, mucinex dm 1200 mg bid/ flutter valve prn    Chronic respiratory failure with hypoxia ([place]) Long term rx s hypercarbia as of 10/31/2021   - 10/31/2021   Walked on 2lpm pulsed x  3  lap(s) =  approx 450 ft @ slow pace, stopped due to end of study s sob with lowest 02 sats 92% >>> titrate to sats mid 90s as not a retainer   He is stable post op with main issue having the right amt of analgesic to overcome pain with cough s suppressing cough too much, defer dosing to Dr Constance Haw and ok to d/c home from my perspective     Best Practice (right click and "Reselect all SmartList Selections" daily)  Per surgery / [facility]   Labs   CBC: No results  for input(s): WBC, NEUTROABS, HGB, HCT, MCV, PLT in the last 168 hours.  Basic Metabolic Panel: Recent Labs  Lab 12/10/21 0359  NA 137  K 4.0  CL 105  CO2 26  GLUCOSE 129*  BUN 19  CREATININE 1.02  CALCIUM 8.8*   GFR: Estimated Creatinine Clearance: 48.5 mL/min (by C-G formula based on SCr of 1.02 mg/dL). No results for input(s): PROCALCITON, WBC, LATICACIDVEN in the last 168 hours.  Liver Function Tests: No results for input(s): AST, ALT, ALKPHOS, BILITOT, PROT, ALBUMIN in the last 168 hours. No results for input(s): LIPASE, AMYLASE in the last 168 hours. No results for input(s): AMMONIA in the last 168 hours.  ABG    Component Value Date/Time   HCO3 25.5 (H) 01/25/2008 1931   TCO2 27 01/25/2008 1931     Coagulation Profile: No results for input(s): INR, PROTIME in the last 168 hours.  Cardiac Enzymes: No results for input(s): CKTOTAL, CKMB, CKMBINDEX, TROPONINI in the last 168  hours.  HbA1C: Hgb A1c MFr Bld  Date/Time Value Ref Range Status  09/26/2021 05:38 AM 6.7 (H) 4.8 - 5.6 % Final    Comment:    (NOTE) Pre diabetes:          5.7%-6.4%  Diabetes:              >6.4%  Glycemic control for   <7.0% adults with diabetes   11/11/2018 12:15 PM CANCELED      Comment:    TEST NOT PERFORMED . No lavender-top tube received.  Result canceled by the ancillary.      Christinia Gully, MD Pulmonary and [place] [phone]   After 7:00 pm call Elink  [phone]

## 2021-12-10 NOTE — Care Management Obs Status (Signed)
[place] NOTIFICATION   Patient Details  Name: Michael Evans MRN: [id number] Date of Birth: Aug 26, 1941   Medicare Observation Status Notification Given:  Yes    Tommy Medal 12/10/2021, 11:48 AM

## 2021-12-12 ENCOUNTER — Ambulatory Visit: Payer: Medicare HMO | Admitting: Internal Medicine

## 2021-12-17 ENCOUNTER — Ambulatory Visit: Payer: Medicare HMO | Admitting: General Surgery

## 2022-01-07 ENCOUNTER — Encounter: Payer: Self-pay | Admitting: General Surgery

## 2022-01-07 ENCOUNTER — Other Ambulatory Visit: Payer: Self-pay

## 2022-01-07 ENCOUNTER — Ambulatory Visit (INDEPENDENT_AMBULATORY_CARE_PROVIDER_SITE_OTHER): Payer: Medicare HMO | Admitting: General Surgery

## 2022-01-07 VITALS — BP 152/77 | HR 62 | Temp 97.7°F | Resp 18 | Ht 65.0 in | Wt 136.0 lb

## 2022-01-07 DIAGNOSIS — K4090 Unilateral inguinal hernia, without obstruction or gangrene, not specified as recurrent: Secondary | ICD-10-CM

## 2022-01-07 NOTE — Patient Instructions (Signed)
No heavy lifting > 10 lbs, excessive bending, pushing, pulling, or squatting for 6-8 weeks after surgery.    Let us know if the right inguinal hernia starts to cause you issues.

## 2022-01-07 NOTE — Progress Notes (Signed)
[facility]  Feeling better. No complaints. Right  side still not bothering him.  BP (!) 152/77    Pulse 62    Temp 97.7 F (36.5 C) (Other (Comment))    Resp 18    Ht 5\' 5"  (1.651 m)    Wt 136 lb (61.7 kg)    SpO2 94%    BMI 22.63 kg/m  Healing left inguinal hernia site, no erythema or drainage, minor induration  Patient s/p L inguinal hernia repair with mesh. Doing well.   No heavy lifting > 10 lbs, excessive bending, pushing, pulling, or squatting for 6-8 weeks after surgery.    Let us know if the right inguinal hernia starts to cause you issues.   PRN follow up   Curlene Labrum, MD [facility] [address] [place], [place] [address] [phone] (office)

## 2022-01-14 DIAGNOSIS — H25813 Combined forms of age-related cataract, bilateral: Secondary | ICD-10-CM | POA: Diagnosis not present

## 2022-01-15 ENCOUNTER — Telehealth: Payer: Self-pay | Admitting: Family Medicine

## 2022-01-15 NOTE — Chronic Care Management (AMB) (Signed)
°  Chronic Care Management   Outreach Note  01/15/2022 Name: Michael Evans MRN: [id number] DOB: [birth year]  Referred by: [name], MD Reason for referral : No chief complaint on file.   An unsuccessful telephone outreach was attempted today. The patient was referred to the pharmacist for assistance with care management and care coordination.   Follow Up Plan:   Tatjana Dellinger [employer]stream Scheduler

## 2022-01-23 ENCOUNTER — Telehealth: Payer: Self-pay | Admitting: Family Medicine

## 2022-01-23 NOTE — Progress Notes (Signed)
°  Chronic Care Management   Note  01/23/2022 Name: JAMAURI KRUZEL MRN: [id number] DOB: 11-01-1941  HARTWELL VANDIVER is a 81 y.o. year old male who is a primary care patient of Dennard Schaumann, Cammie Mcgee, MD. I reached out to Lourena Simmonds by phone today in response to a referral sent by Mr. Lorain Keast Ruda's PCP, [name], MD.   Mr. Motta was given information about Chronic Care Management services today including:  CCM service includes personalized support from designated clinical staff supervised by his physician, including individualized plan of care and coordination with other care providers 24/7 contact phone numbers for assistance for urgent and routine care needs. Service will only be billed when office clinical staff spend 20 minutes or more in a month to coordinate care. Only one practitioner may furnish and bill the service in a calendar month. The patient may stop CCM services at any time (effective at the end of the month) by phone call to the office staff.   Patient agreed to services and verbal consent obtained.   Follow up plan: PT AWARE HE MAY HAVE A COPAY WITH HIS [place]

## 2022-01-27 ENCOUNTER — Emergency Department (HOSPITAL_COMMUNITY)
Admission: EM | Admit: 2022-01-27 | Discharge: 2022-01-27 | Disposition: A | Discharge status: Home / Self Care | Payer: Medicare HMO | Attending: Emergency Medicine | Admitting: Emergency Medicine

## 2022-01-27 ENCOUNTER — Encounter: Payer: Medicare HMO | Admitting: Family Medicine

## 2022-01-27 ENCOUNTER — Other Ambulatory Visit: Payer: Self-pay

## 2022-01-27 ENCOUNTER — Encounter (HOSPITAL_COMMUNITY): Payer: Self-pay

## 2022-01-27 ENCOUNTER — Emergency Department (HOSPITAL_COMMUNITY): Payer: Medicare HMO

## 2022-01-27 DIAGNOSIS — I10 Essential (primary) hypertension: Secondary | ICD-10-CM | POA: Insufficient documentation

## 2022-01-27 DIAGNOSIS — R1031 Right lower quadrant pain: Secondary | ICD-10-CM | POA: Diagnosis present

## 2022-01-27 DIAGNOSIS — J449 Chronic obstructive pulmonary disease, unspecified: Secondary | ICD-10-CM | POA: Insufficient documentation

## 2022-01-27 DIAGNOSIS — I700 Atherosclerosis of aorta: Secondary | ICD-10-CM | POA: Diagnosis not present

## 2022-01-27 DIAGNOSIS — Z79899 Other long term (current) drug therapy: Secondary | ICD-10-CM | POA: Insufficient documentation

## 2022-01-27 DIAGNOSIS — K4090 Unilateral inguinal hernia, without obstruction or gangrene, not specified as recurrent: Secondary | ICD-10-CM | POA: Diagnosis not present

## 2022-01-27 DIAGNOSIS — R109 Unspecified abdominal pain: Secondary | ICD-10-CM | POA: Diagnosis not present

## 2022-01-27 LAB — COMPREHENSIVE METABOLIC PANEL
ALT: 25 U/L (ref 0–44)
AST: 27 U/L (ref 15–41)
Albumin: 4.3 g/dL (ref 3.5–5.0)
Alkaline Phosphatase: 55 U/L (ref 38–126)
Anion gap: 8 (ref 5–15)
BUN: 26 mg/dL — ABNORMAL HIGH (ref 8–23)
CO2: 27 mmol/L (ref 22–32)
Calcium: 9.9 mg/dL (ref 8.9–10.3)
Chloride: 105 mmol/L (ref 98–111)
Creatinine, Ser: 0.86 mg/dL (ref 0.61–1.24)
GFR, Estimated: >60 mL/min (ref >60)
Glucose, Bld: 131 mg/dL — ABNORMAL HIGH (ref 70–99)
Potassium: 4.4 mmol/L (ref 3.5–5.1)
Sodium: 140 mmol/L (ref 135–145)
Total Bilirubin: 0.7 mg/dL (ref 0.3–1.2)
Total Protein: 7.3 g/dL (ref 6.5–8.1)

## 2022-01-27 LAB — CBC WITH DIFFERENTIAL/PLATELET
Abs Immature Granulocytes: 0.03 10*3/uL (ref 0.00–0.07)
Basophils Absolute: 0 10*3/uL (ref 0.0–0.1)
Basophils Relative: 0 %
Eosinophils Absolute: 0 10*3/uL (ref 0.0–0.5)
Eosinophils Relative: 0 %
HCT: 46.9 % (ref 39.0–52.0)
Hemoglobin: 15.3 g/dL (ref 13.0–17.0)
Immature Granulocytes: 0 %
Lymphocytes Relative: 13 %
Lymphs Abs: 1.2 10*3/uL (ref 0.7–4.0)
MCH: 29.8 pg (ref 26.0–34.0)
MCHC: 32.6 g/dL (ref 30.0–36.0)
MCV: 91.2 fL (ref 80.0–100.0)
Monocytes Absolute: 0.3 10*3/uL (ref 0.1–1.0)
Monocytes Relative: 4 %
Neutro Abs: 7.3 10*3/uL (ref 1.7–7.7)
Neutrophils Relative %: 83 %
Platelets: 226 10*3/uL (ref 150–400)
RBC: 5.14 MIL/uL (ref 4.22–5.81)
RDW: 12.3 % (ref 11.5–15.5)
WBC: 8.9 10*3/uL (ref 4.0–10.5)
nRBC: 0 % (ref 0.0–0.2)

## 2022-01-27 LAB — LIPASE, BLOOD: Lipase: 26 U/L (ref 11–51)

## 2022-01-27 LAB — LACTIC ACID, PLASMA: Lactic Acid, Venous: 1.4 mmol/L (ref 0.5–1.9)

## 2022-01-27 MED ORDER — OXYCODONE-ACETAMINOPHEN 5-325 MG PO TABS: 1.0000 | ORAL_TABLET | Freq: Once | ORAL | Status: DC

## 2022-01-27 MED ORDER — IOHEXOL 300 MG/ML  SOLN
100.0000 mL | Freq: Once | INTRAMUSCULAR | Status: AC | PRN
  Administered 2022-01-27: 100 mL via INTRAVENOUS

## 2022-01-27 MED ORDER — ONDANSETRON HCL 4 MG/2ML IJ SOLN
4.0000 mg | Freq: Once | INTRAMUSCULAR | Status: AC
  Administered 2022-01-27: 4 mg via INTRAVENOUS
  Filled 2022-01-27: qty 2

## 2022-01-27 MED ORDER — ONDANSETRON 4 MG PO TBDP
4.0000 mg | ORAL_TABLET | Freq: Once | ORAL | Status: DC
  Filled 2022-01-27: qty 1

## 2022-01-27 MED ORDER — FENTANYL CITRATE PF 50 MCG/ML IJ SOSY
75.0000 ug | PREFILLED_SYRINGE | Freq: Once | INTRAMUSCULAR | Status: AC
  Administered 2022-01-27: 75 ug via INTRAVENOUS
  Filled 2022-01-27: qty 2

## 2022-01-27 NOTE — ED Triage Notes (Signed)
Pt presents to ED with complaints of pain to right groin, started this am. Pt states he has been told he has a hernia there. Pt recently had left hernia repair.

## 2022-01-27 NOTE — Discharge Instructions (Signed)
You have a hernia that was reduced, please refrain from lifting up heavy objects, or increasing pressure within your abdomen is a skin because you are hernia to protrude.  I want you to follow-up with general surgery given the contact information please call.  Come back to the emergency department if you develop chest pain, shortness of breath, severe abdominal pain, uncontrolled nausea, vomiting, diarrhea.

## 2022-01-27 NOTE — ED Provider Notes (Cosign Needed)
[facility] EMERGENCY DEPARTMENT Provider Note   CSN: [id number] Arrival date & time: 01/27/22  1336     History  Chief Complaint  Patient presents with   Groin Pain    Michael Evans is a 81 y.o. male.  HPI  Patient with medical history including COPD, hypertension, on home oxygen, recent left inguinal hernia repair presents to the  with complaints of right groin wall pain.  Patient's pain started suddenly today, pain is mainly in his right groin will feel it rating to his testicles, he had associated nausea without vomiting states he had a bowel movement yesterday has not had a bowel move today states he still passing gas, no fevers or chills, no urinary symptoms, no flank tenderness, patient's only complaint at this time.  Pain is constant, there is no alleviating or aggravating factors.   Reviewed patient's chart had a left-sided inguinal hernia repair back in January by Dr. Constance Haw, and noted that on previous CT scans he had a small right-sided hernia.  Home Medications Prior to Admission medications   Medication Sig Start Date End Date Taking? Authorizing Provider  albuterol (VENTOLIN HFA) 108 (90 Base) MCG/ACT inhaler Inhale 1-2 puffs into the lungs every 6 (six) hours as needed for wheezing or shortness of breath. 01/01/21   [name], MD  Ascorbic Acid (VITAMIN C) 1000 MG tablet Take 1,000 mg by mouth daily.    [provider]  Budeson-Glycopyrrol-Formoterol (BREZTRI AEROSPHERE) 160-9-4.8 MCG/ACT AERO Inhale 2 puffs into the lungs in the morning and at bedtime.    [provider]  cholecalciferol (VITAMIN D) 25 MCG (1000 UNIT) tablet Take 3,000 Units by mouth daily.    [provider]  cyanocobalamin 2000 MCG tablet Take 1,000 mcg by mouth daily.    [provider]  dextromethorphan-guaiFENesin (MUCINEX DM) 30-600 MG 12hr tablet Take 2 tablets by mouth 2 (two) times daily as needed for cough (sputum). 12/10/21   [name], MD   ipratropium-albuterol (DUONEB) 0.5-2.5 (3) MG/3ML SOLN Take by nebulization. 10/26/21   [provider]  Multiple Vitamin (MULTIVITAMIN) tablet Take 1 tablet by mouth daily. Men's health    [provider]  Nebulizer MISC 1 each by Does not apply route daily as needed. PLEASE PROVIDE NEBULIZER KIT TO INCLUDE TUBING/MASK/MOUTHPIECE 10/04/21   [name], MD  Omega-3 Fatty Acids (FISH OIL CONCENTRATE PO) Take 1 capsule by mouth daily.    [provider]  ondansetron (ZOFRAN ODT) 8 MG disintegrating tablet Take 1 tablet (8 mg total) by mouth every 8 (eight) hours as needed for nausea or vomiting. 10/19/21   Barton Dubois, MD  oxyCODONE (OXY IR/ROXICODONE) 5 MG immediate release tablet Take 1 tablet (5 mg total) by mouth every 4 (four) hours as needed for severe pain or breakthrough pain. 12/10/21   [name], MD  OXYGEN Inhale 2 L into the lungs continuous.    [provider]  pantoprazole (PROTONIX) 40 MG tablet Take by mouth. 12/12/21   [provider]  pyridoxine (B-6) 100 MG tablet Take 100 mg by mouth daily.    [provider]      Allergies    Amlodipine, Aspirin, Other, Penicillins, and Sulfa antibiotics    Review of Systems   Review of Systems  Constitutional:  Negative for chills and fever.  Respiratory:  Negative for shortness of breath.   Cardiovascular:  Negative for chest pain.  Gastrointestinal:  Positive for nausea. Negative for abdominal pain, diarrhea  and vomiting.  Genitourinary:  Negative for dysuria and hematuria.  Neurological:  Negative for headaches.   Physical Exam Updated Vital Signs BP 139/72 (BP Location: Right Arm)    Pulse 60    Temp 98.1 F (36.7 C) (Oral)    Resp 16    Ht 5' 5"  (1.651 m)    Wt 68 kg    SpO2 98%    BMI 24.96 kg/m  Physical Exam Vitals and nursing note reviewed. Exam conducted with a chaperone present.  Constitutional:      General: He is not in acute distress.     Appearance: He is not ill-appearing.  HENT:     Head: Normocephalic and atraumatic.     Nose: No congestion.  Eyes:     Conjunctiva/sclera: Conjunctivae normal.  Cardiovascular:     Rate and Rhythm: Normal rate and regular rhythm.     Pulses: Normal pulses.     Heart sounds: No murmur heard.   No friction rub. No gallop.  Pulmonary:     Effort: No respiratory distress.     Breath sounds: No wheezing, rhonchi or rales.  Abdominal:     Palpations: Abdomen is soft.     Tenderness: There is abdominal tenderness. There is no right CVA tenderness or left CVA tenderness.     Comments: Abdomen nondistended, normal bowel sounds, dull to percussion, has tenderness on his right lower quadrant, there is no guarding, rebound times, peritoneal sign negative Murphy's or McBurney point.  Genitourinary:    Comments: With chaperone present genital exam was performed no noted [place] Clapper deformity, no overlying skin changes on the testicles, no overlying skin tracing on the penis, no penile discharge.  Testicles were nontender, unable to palpate direct inguinal hernia.  But did palpate a small mass within the right inguinal region, none fixated, movable, tender without overlying skin changes.  No flexion induration noted. Skin:    General: Skin is warm and dry.  Neurological:     Mental Status: He is alert.  Psychiatric:        Mood and Affect: Mood normal.    ED Results / Procedures / Treatments   Labs (all labs ordered are listed, but only abnormal results are displayed) Labs Reviewed  COMPREHENSIVE METABOLIC PANEL - Abnormal; Notable for the following components:      Result Value   Glucose, Bld 131 (*)    BUN 26 (*)    All other components within normal limits  LACTIC ACID, PLASMA  LIPASE, BLOOD  CBC WITH DIFFERENTIAL/PLATELET  URINALYSIS, ROUTINE W REFLEX MICROSCOPIC    EKG None  Radiology CT ABDOMEN PELVIS W CONTRAST  Result Date: 01/27/2022 CLINICAL DATA:  Right groin pain.  EXAM: CT ABDOMEN AND PELVIS WITH CONTRAST TECHNIQUE: Multidetector CT imaging of the abdomen and pelvis was performed using the standard protocol following bolus administration of intravenous contrast. RADIATION DOSE REDUCTION: This exam was performed according to the departmental dose-optimization program which includes automated exposure control, adjustment of the mA and/or kV according to patient size and/or use of iterative reconstruction technique. CONTRAST:  133m OMNIPAQUE IOHEXOL 300 MG/ML  SOLN COMPARISON:  CT scan 10/18/2021 FINDINGS: Lower chest: Stable emphysematous changes and pulmonary scarring. The heart is normal in size. No pericardial effusion. Hepatobiliary: No hepatic lesions or intrahepatic biliary dilatation. The gallbladder is unremarkable. No common bile duct dilatation. Pancreas: No mass, inflammation or ductal dilatation. Spleen: Normal size.  No focal lesions. Adrenals/Urinary Tract: Adrenal glands and kidneys are unremarkable and stable.  Stable left renal cyst. The bladder is unremarkable. Stomach/Bowel: The stomach, duodenum, small bowel and colon are unremarkable. There is a right inguinal hernia containing a slightly dilated fluid-filled loop of small bowel. Both limbs are somewhat constricted in the hernia and there are slightly dilated loops of small bowel proximally. No surrounding inflammatory changes or fluid in the hernia sac. Very similar appearance on the prior CT scan. The left inguinal hernia seen on the prior CT scan has been fixed surgically. Vascular/Lymphatic: Stable advanced atherosclerotic calcifications involving the aorta and branch vessels but no focal aneurysm or dissection. The major venous structures are patent. No mesenteric or retroperitoneal mass or adenopathy. Reproductive: Mildly enlarged prostate gland appears stable. The seminal vesicles are unremarkable. Other: No pelvic mass or adenopathy. No free pelvic fluid collections. No inguinal mass or adenopathy.  No abdominal wall hernia or subcutaneous lesions. Musculoskeletal: No significant bony findings. IMPRESSION: 1. Right inguinal hernia containing a fluid-filled small bowel lobe with early obstructive findings. 2. Status post left inguinal hernia repair. 3. Stable advanced atherosclerotic calcifications involving the aorta and branch vessels. Aortic Atherosclerosis (ICD10-I70.0). Electronically Signed   By: Marijo Sanes M.D.   On: 01/27/2022 16:48    Procedures Procedures    Medications Ordered in ED Medications  iohexol (OMNIPAQUE) 300 MG/ML solution 100 mL (100 mLs Intravenous Contrast Given 01/27/22 1628)  fentaNYL (SUBLIMAZE) injection 75 mcg (75 mcg Intravenous Given 01/27/22 1726)  ondansetron (ZOFRAN) injection 4 mg (4 mg Intravenous Given 01/27/22 1725)    ED Course/ Medical Decision Making/ A&P                           Medical Decision Making Amount and/or Complexity of Data Reviewed Labs: ordered. Radiology: ordered.  Risk Prescription drug management.   This patient presents to the ED for concern of right groin no pain, this involves an extensive number of treatment options, and is a complaint that carries with it a high risk of complications and morbidity.  The differential diagnosis includes bowel obstruction, incarcerated hernia, strangulated hernia diverticulitis    Additional history obtained:  Additional history obtained from electronic medical record External records from outside source obtained and reviewed including please see HPI for further detail   Co morbidities that complicate the patient evaluation  Previous inguinal hernia  Social Determinants of Health:  N/A    Lab Tests:  I Ordered, and personally interpreted labs.  The pertinent results include: CBC unremarkable, CMP unremarkable lactic 1.4 lipase 26   Imaging Studies ordered:  I ordered imaging studies including CT abdomen pelvis I independently visualized and interpreted imaging which  showed right inguinal hernia with small bowel loops released on the possible obstruction I agree with the radiologist interpretation   Cardiac Monitoring:  The patient was maintained on a cardiac monitor.  I personally viewed and interpreted the cardiac monitored which showed an underlying rhythm of: N/A   Medicines ordered and prescription drug management:  I ordered medication including fentanyl for pain control I have reviewed the patients home medicines and have made adjustments as needed   Reevaluation:  CT scan shows evidence of right inguinal hernia with possible bowel obstruction, will consult with general surgery for further recommendations.  Patient was provided fentanyl, and was able to reduce hernia, still feels slight defect in the area of concern it is not fully reduce we will consult with general surgery for further recommendations. Consultations Obtained:  I requested consultation with the general  surgeon Dr.pappayliou,  and discussed lab and imaging findings as well as pertinent plan - they recommend: Recommends attempting to reduce hernia if reducible patient be sent home with close follow-up.  If unable to have be admitted to hospitalist team. Spoke with Dr.pappayliou, personally evaluated the patient and was able to fully reduce the hernia, recommends that patient follow-up with their clinic in the next couple days.   Rule out Low suspicion for systemic infection patient nontoxic-appearing vital signs reassuring does not meet sepsis or SIRS criteria.  Low suspicion for ischemic bowel disease nontachycardic no leukocytosis, no elevated lactic.  I have low suspicion for incarcerated or strangulated hernia as hernia was reduced patient is not in pain.     Dispostion and problem list  After consideration of the diagnostic results and the patients response to treatment, I feel that the patent would benefit from discharge.  Inguinal hernia reduced-we will have patient  follow-up with general surgery, given strict return precautions.            Final Clinical Impression(s) / ED Diagnoses Final diagnoses:  Unilateral inguinal hernia without obstruction or gangrene, recurrence not specified    Rx / DC Orders ED Discharge Orders     None         Marcello Fennel, PA-C 01/27/22 1909

## 2022-01-27 NOTE — Consult Note (Signed)
[place]  Reason for Consult: Right inguinal hernia Referring Physician: Deno Etienne, PA-C  Chief Complaint   Groin Pain     HPI: Michael Evans is a 81 y.o. male who presents with a few hour history of right groin pain.  He states that the pain started this morning.  He denies any nausea or vomiting since the pain started.  He last had a bowel movement yesterday, and denies passing flatus today.  He has been previously diagnosed with a right inguinal hernia.  He was admitted to the hospital in November 2022 for SBO secondary to a incarcerated right inguinal hernia.  At that time, the hernia was able to be reduced, but it was not repaired secondary to recent steroid use.  He then complained of more left-sided groin pain, at which time his left inguinal hernia was repaired in January 2023 by Dr. Constance Haw.  He was advised to follow-up as needed for his right inguinal hernia pain.  In the ED, the patient has no leukocytosis or lactic acidosis.  He is hemodynamically stable.  He underwent a CT abdomen and pelvis which demonstrated a right inguinal hernia containing a loop of bowel possible early SBO signs, with mild proximal bowel dilation.  Past Medical History:  Diagnosis Date   Arthritis    Chronic rhinitis    COPD (chronic obstructive pulmonary disease) ([place])    PFT 06-26-09 FEV1  1.75( 71%), FVC 3.65( 101%), FEV1% 48, TLC 5.53(104%), DLCO 55%, no BD   Hypertension    Nasal septal deviation    Nocturia    On home O2    2L N/C   OSA (obstructive sleep apnea) 04/21/2011   Skin cancer     Past Surgical History:  Procedure Laterality Date   APPENDECTOMY     BACK SURGERY     CATARACT EXTRACTION W/PHACO  01/06/2013   Procedure: CATARACT EXTRACTION PHACO AND INTRAOCULAR LENS PLACEMENT ([place]);  Surgeon: Tonny Branch, MD;  Location: [facility];  Service: Ophthalmology;  Laterality: Right;  CDE: 22.17   INGUINAL HERNIA REPAIR Left 12/09/2021   Procedure: HERNIA REPAIR  INGUINAL ADULT WITH MESH;  Surgeon: [name], MD;  Location: [facility];  Service: General;  Laterality: Left;    Family History  Problem Relation Age of Onset   Lung cancer Mother     Social History   Tobacco Use   Smoking status: Former    Packs/day: 1.50    Years: 54.00    Pack years: 81.00    Types: Cigarettes    Quit date: 12/01/2001    Years since quitting: 20.1   Smokeless tobacco: Never  Vaping Use   Vaping Use: Never used  Substance Use Topics   Alcohol use: No   Drug use: No    Medications: I have reviewed the patient's current medications.  Allergies  Allergen Reactions   Amlodipine Swelling    Swelling in feet   Aspirin Other (See Comments)    Upset stomach.   Other Hypertension    Hazelnuts   Penicillins Hives    Has patient had a PCN reaction causing immediate rash, facial/tongue/throat swelling, SOB or lightheadedness with hypotension: Yes Has patient had a PCN reaction causing severe rash involving mucus membranes or skin necrosis: No Has patient had a PCN reaction that required hospitalization No Has patient had a PCN reaction occurring within the last 10 years: No If all of the above answers are "NO", then may proceed with Cephalosporin use.  Sulfa Antibiotics Hives     ROS:  Constitutional: negative for chills, fatigue, and fevers Respiratory: negative for cough and wheezing Cardiovascular: negative for chest pain and palpitations Gastrointestinal: positive for abdominal pain, negative for nausea, reflux symptoms, and vomiting Musculoskeletal:negative for back pain and neck pain  Blood pressure 139/72, pulse 60, temperature 98.1 F (36.7 C), temperature source Oral, resp. rate 16, height 5\' 5"  (1.651 m), weight 68 kg, SpO2 98 %. Physical Exam Vitals reviewed.  Constitutional:      Appearance: Normal appearance.  HENT:     Head: Normocephalic and atraumatic.  Eyes:     Extraocular Movements: Extraocular movements intact.      Pupils: Pupils are equal, round, and reactive to light.  Cardiovascular:     Rate and Rhythm: Normal rate.  Pulmonary:     Effort: Pulmonary effort is normal.  Abdominal:     Comments: Abdomen soft, nondistended, nontender to percussion and palpation, no rigidity, guarding, rebound tenderness; mildly tender right inguinal bulge, inguinal hernia able to be reduced at bedside, no overlying skin changes, left groin with well-healing incision  Musculoskeletal:        General: Normal range of motion.     Cervical back: Normal range of motion.  Skin:    General: Skin is warm and dry.  Neurological:     General: No focal deficit present.     Mental Status: He is alert and oriented to person, place, and time.  Psychiatric:        Mood and Affect: Mood normal.        Behavior: Behavior normal.    Results: Results for orders placed or performed during the hospital encounter of 01/27/22 (from the past 48 hour(s))  Comprehensive metabolic panel     Status: Abnormal   Collection Time: 01/27/22  3:01 PM  Result Value Ref Range   Sodium 140 135 - 145 mmol/L   Potassium 4.4 3.5 - 5.1 mmol/L   Chloride 105 98 - 111 mmol/L   CO2 27 22 - 32 mmol/L   Glucose, Bld 131 (H) 70 - 99 mg/dL    Comment: Glucose reference range applies only to samples taken after fasting for at least 8 hours.   BUN 26 (H) 8 - 23 mg/dL   Creatinine, Ser 0.86 0.61 - 1.24 mg/dL   Calcium 9.9 8.9 - 10.3 mg/dL   Total Protein 7.3 6.5 - 8.1 g/dL   Albumin 4.3 3.5 - 5.0 g/dL   AST 27 15 - 41 U/L   ALT 25 0 - 44 U/L   Alkaline Phosphatase 55 38 - 126 U/L   Total Bilirubin 0.7 0.3 - 1.2 mg/dL   GFR, Estimated >60 >60 mL/min    Comment: (NOTE) Calculated using the CKD-EPI Creatinine Equation (2021)    Anion gap 8 5 - 15    Comment: Performed at [facility], [address], [place], [place] [address]  Lactic acid, plasma     Status: None   Collection Time: 01/27/22  3:01 PM  Result Value Ref Range   Lactic Acid, Venous 1.4  0.5 - 1.9 mmol/L    Comment: Performed at [facility], [address], [place], [place] [address]  Lipase, blood     Status: None   Collection Time: 01/27/22  3:01 PM  Result Value Ref Range   Lipase 26 11 - 51 U/L    Comment: Performed at [facility], [address], [place], [place] [address]  CBC with Differential  Status: None   Collection Time: 01/27/22  3:01 PM  Result Value Ref Range   WBC 8.9 4.0 - 10.5 K/uL   RBC 5.14 4.22 - 5.81 MIL/uL   Hemoglobin 15.3 13.0 - 17.0 g/dL   HCT 46.9 39.0 - 52.0 %   MCV 91.2 80.0 - 100.0 fL   MCH 29.8 26.0 - 34.0 pg   MCHC 32.6 30.0 - 36.0 g/dL   RDW 12.3 11.5 - 15.5 %   Platelets 226 150 - 400 K/uL   nRBC 0.0 0.0 - 0.2 %   Neutrophils Relative % 83 %   Neutro Abs 7.3 1.7 - 7.7 K/uL   Lymphocytes Relative 13 %   Lymphs Abs 1.2 0.7 - 4.0 K/uL   Monocytes Relative 4 %   Monocytes Absolute 0.3 0.1 - 1.0 K/uL   Eosinophils Relative 0 %   Eosinophils Absolute 0.0 0.0 - 0.5 K/uL   Basophils Relative 0 %   Basophils Absolute 0.0 0.0 - 0.1 K/uL   Immature Granulocytes 0 %   Abs Immature Granulocytes 0.03 0.00 - 0.07 K/uL    Comment: Performed at [facility], [address], [place], [place] [address]    CT ABDOMEN PELVIS W CONTRAST  Result Date: 01/27/2022 CLINICAL DATA:  Right groin pain. EXAM: CT ABDOMEN AND PELVIS WITH CONTRAST TECHNIQUE: Multidetector CT imaging of the abdomen and pelvis was performed using the standard protocol following bolus administration of intravenous contrast. RADIATION DOSE REDUCTION: This exam was performed according to the departmental dose-optimization program which includes automated exposure control, adjustment of the mA and/or kV according to patient size and/or use of iterative reconstruction technique. CONTRAST:  183mL OMNIPAQUE IOHEXOL 300 MG/ML  SOLN COMPARISON:  CT scan 10/18/2021 FINDINGS: Lower chest: Stable emphysematous changes and pulmonary scarring. The heart is normal in size. No pericardial  effusion. Hepatobiliary: No hepatic lesions or intrahepatic biliary dilatation. The gallbladder is unremarkable. No common bile duct dilatation. Pancreas: No mass, inflammation or ductal dilatation. Spleen: Normal size.  No focal lesions. Adrenals/Urinary Tract: Adrenal glands and kidneys are unremarkable and stable. Stable left renal cyst. The bladder is unremarkable. Stomach/Bowel: The stomach, duodenum, small bowel and colon are unremarkable. There is a right inguinal hernia containing a slightly dilated fluid-filled loop of small bowel. Both limbs are somewhat constricted in the hernia and there are slightly dilated loops of small bowel proximally. No surrounding inflammatory changes or fluid in the hernia sac. Very similar appearance on the prior CT scan. The left inguinal hernia seen on the prior CT scan has been fixed surgically. Vascular/Lymphatic: Stable advanced atherosclerotic calcifications involving the aorta and branch vessels but no focal aneurysm or dissection. The major venous structures are patent. No mesenteric or retroperitoneal mass or adenopathy. Reproductive: Mildly enlarged prostate gland appears stable. The seminal vesicles are unremarkable. Other: No pelvic mass or adenopathy. No free pelvic fluid collections. No inguinal mass or adenopathy. No abdominal wall hernia or subcutaneous lesions. Musculoskeletal: No significant bony findings. IMPRESSION: 1. Right inguinal hernia containing a fluid-filled small bowel lobe with early obstructive findings. 2. Status post left inguinal hernia repair. 3. Stable advanced atherosclerotic calcifications involving the aorta and branch vessels. Aortic Atherosclerosis (ICD10-I70.0). Electronically Signed   By: Marijo Sanes M.D.   On: 01/27/2022 16:48     Assessment & Plan:  Michael Evans is a 81 y.o. male who presents with a right inguinal hernia and right groin pain.  He has no leukocytosis, with WBC count of 8.9, no lactic acidosis, 1.4,  and CT  abdomen pelvis demonstrating a right inguinal hernia containing a loop of small bowel with possible early signs of bowel obstruction with mild proximal bowel dilation.  -Right inguinal hernia was able to be reduced without significant difficulty or discomfort to the patient -Imaging was evaluated by myself in addition to patient's laboratory studies -Given the patient is hemodynamically stable with no leukocytosis or lactic acidosis, and right inguinal hernia was able to be reduced, recommend that patient be discharged home with close follow-up with general surgery -Will schedule the patient for follow-up in clinic with either myself or Dr. Constance Haw this week to discuss and schedule open right inguinal hernia repair -Advised the patient to either call the office or present back to the emergency department if he again experiences right groin pain with an associated bulge, overlying skin changes, nausea, vomiting, and obstipation -This plan was discussed with the patient, who was agreeable  All questions were answered to the satisfaction of the patient.  -- Graciella Freer, DO [facility] [address] [place], [place] [address] [phone] (office)

## 2022-01-28 ENCOUNTER — Ambulatory Visit: Payer: Medicare HMO | Admitting: Internal Medicine

## 2022-01-28 NOTE — Progress Notes (Unsigned)
Michael Evans, male    DOB: [birth year], 81 y.o.   MRN: [id number]   Brief patient profile:  35 yowm MM/quit smoking 2003 with h/o copd GOLD 2  prev rx by Michael Evans  referred to pulmonary clinic in [place]  10/31/2021 by Dr  Michael Evans p admit:    Admit date: 09/24/2021 Discharge date: 09/29/2021        Brief Hospitalization Summary: Please see all hospital notes, images, labs for full details of the hospitalization. ADMISSION HPI: Michael Evans is a 81 y.o. male with medical history significant of COPD, chronic respiratory failure on 2 L, hypertension, arthritis presented with acute on chronic respiratory failure with hypoxia and COPD exacerbation.  Patient noted to have been seen in the ER back on October 18 for similar symptoms.  Was diagnosed with a COPD exacerbation.  Per report, patient was placed on a Z-Pak.  Symptoms have minimally improved over the course of this treatment.  Unclear if patient got steroids with evaluation.  Per the patient, he has had increased cough, wheezing, mild sputum production.    Patient states that symptoms got worse once the vents were closed in his house.     ED Course: Presented to the ER afebrile, hemodynamically stable.  Requiring 2 to 4 L nasal cannula for oxygenation.  COVID-negative.  Labs notable for K of 5.8.  Resolved after albuterol treatment and Lokelma with a potassium of 3.4.  Troponin and EKG within normal limits.  Chest x-ray within normal limits.    HOSPITAL COURSE by problem list   Acute on chronic respiratory failure with hypoxia secondary to COPD exacerbation -Patient was treated with bronchodilators, IV steroids, antibiotics and now feeling back to his baseline.  DC home with outpatient follow up.  He will discharge home on prednisone taper.  Ambulatory referral to [facility].  He has not seen them in several years.     Severe COPD - Patient has diffuse if for emphysematous changes on x-ray - see above recommendations for acute  exacerbation  -Sputum culture reviewed normal respiratory flora no staph or Pseudomonas noted He will discharge home on prednisone taper and ambulatory referral to [facility] pulmonary clinic.     Essential Hypertension  -His blood pressure is managed with diet only, we are monitoring and it has been stable and controlled   OSA -We offered nightly CPAP in hospital       History of Present Illness  10/31/2021  Pulmonary/ 1st office eval/ Michael Evans / [place] on breztri  Chief Complaint  Patient presents with   Consult    2lpm pulse when out. 2lpm cont. At home.  Consult for emphysema and copd diagnosis.  Has seen Dr. Halford Evans in 2016. Has used oxygen for almost 3 years unsure if put on oxygen by pcp or after hosp d/c.   Dyspnea:  25 ft on 2lpm does not adjust 02  Cough: better now but sometimes 24/7 >mucus usually clear  Sleep: bed is flat/ one pillow SABA use: has ventolin but  not using/ not needing neb  New problem = cp slt to R of midline x 3-4 weeks more positional than pleuritiic  Rec Make sure you check your oxygen saturation  AT  your highest level of activity (not after you stop)   to be sure it stays over 90% and adjust  02 flow upward to maintain this level if needed but remember to turn it back to previous settings when you stop (to conserve your  supply).  Plan A = Automatic = Always=   Breztri Take 2 puffs first thing in am and then another 2 puffs about 12 hours later.   Work on inhaler technique:   Also take Pantoprazole (protonix) 40 mg   Take  30-60 min before first meal of the day and Pepcid (famotidine)  20 mg after supper until return to office - this is the best way to tell whether stomach acid is contributing to your problem.   Plan B = Backup (to supplement plan A, not to replace it) Only use your albuterol inhaler as a rescue medication  Plan C = Crisis (instead of Plan B but only if Plan B stops working) - only use your albuterol nebulizer if you first try  Plan B and it fails to help   For cough > mucinex dm 1200 mg every 12 hours  as needed  GERD diet reviewed, bed blocks rec   NP added flutter 11/29/21   01/28/2022  f/u ov/Michael Evans re: Michael Evans 2 COPD    maint on ***   No chief complaint on file.   Dyspnea:  *** Cough: *** Sleeping: *** SABA use: *** 02: *** Covid status:   ***   No obvious day to day or daytime variability or assoc excess/ purulent sputum or mucus plugs or hemoptysis or cp or chest tightness, subjective wheeze or overt sinus or hb symptoms.   *** without nocturnal  or early am exacerbation  of respiratory  c/o's or need for noct saba. Also denies any obvious fluctuation of symptoms with weather or environmental changes or other aggravating or alleviating factors except as outlined above   No unusual exposure hx or h/o childhood pna/ asthma or knowledge of premature birth.  Current Allergies, Complete Past Medical History, Past Surgical History, Family History, and Social History were reviewed in [employer] record.  ROS  The following are not active complaints unless bolded Hoarseness, sore throat, dysphagia, dental problems, itching, sneezing,  nasal congestion or discharge of excess mucus or purulent secretions, ear ache,   fever, chills, sweats, unintended wt loss or wt gain, classically pleuritic or exertional cp,  orthopnea pnd or arm/hand swelling  or leg swelling, presyncope, palpitations, abdominal pain, anorexia, nausea, vomiting, diarrhea  or change in bowel habits or change in bladder habits, change in stools or change in urine, dysuria, hematuria,  rash, arthralgias, visual complaints, headache, numbness, weakness or ataxia or problems with walking or coordination,  change in mood or  memory.        No outpatient medications have been marked as taking for the 01/28/22 encounter (Appointment) with Tanda Rockers, MD.             Past Medical History:  Diagnosis Date   Arthritis     Chronic rhinitis    COPD (chronic obstructive pulmonary disease) ([place])    PFT 06-26-09 FEV1  1.75( 71%), FVC 3.65( 101%), FEV1% 48, TLC 5.53(104%), DLCO 55%, no BD   Hypertension    Nasal septal deviation    Nocturia    On home O2    2L N/C   OSA (obstructive sleep apnea) 04/21/2011   Skin cancer          Objective:     Wt Readings from Last 3 Encounters:  01/27/22 150 lb (68 kg)  01/07/22 136 lb (61.7 kg)  12/10/21 138 lb 3.7 oz (62.7 kg)      Vital signs reviewed  01/28/2022  - Note  at rest 02 sats  ***% on ***   General appearance:    ***      Mod bar ***     Assessment

## 2022-01-30 ENCOUNTER — Ambulatory Visit: Payer: Medicare HMO | Admitting: General Surgery

## 2022-02-07 ENCOUNTER — Other Ambulatory Visit: Payer: Self-pay

## 2022-02-07 ENCOUNTER — Ambulatory Visit (INDEPENDENT_AMBULATORY_CARE_PROVIDER_SITE_OTHER): Payer: Medicare HMO

## 2022-02-07 VITALS — BP 118/56 | HR 65 | Ht 65.0 in | Wt 137.0 lb

## 2022-02-07 DIAGNOSIS — Z0000 Encounter for general adult medical examination without abnormal findings: Secondary | ICD-10-CM | POA: Diagnosis not present

## 2022-02-07 NOTE — Patient Instructions (Signed)
[name] , Thank you for taking time to come for your Medicare Wellness Visit. I appreciate your ongoing commitment to your health goals. Please review the following plan we discussed and let me know if I can assist you in the future.   Screening recommendations/referrals: Colonoscopy: No longer required due to age.   Recommended yearly ophthalmology/optometry visit for glaucoma screening and checkup Recommended yearly dental visit for hygiene and checkup  Vaccinations: Influenza vaccine: Done 11/29/2021 Repeat annually  Pneumococcal vaccine: Done 01/23/2016 and 09/18/2009 Tdap vaccine: Due Repeat in 10 years  Shingles vaccine: Discussed.    Covid-19: Done 02/06/2020 and 03/01/2020  Advanced directives: Please bring a copy of your health care power of attorney and living will to the office to be added to your chart at your convenience.   Conditions/risks identified: KEEP UP THE GOOD WORK!!  Next appointment: Follow up in one year for your annual wellness visit. 2024.   Preventive Care 83 Years and Older, Male  Preventive care refers to lifestyle choices and visits with your health care provider that can promote health and wellness. What does preventive care include? A yearly physical exam. This is also called an annual well check. Dental exams once or twice a year. Routine eye exams. Ask your health care provider how often you should have your eyes checked. Personal lifestyle choices, including: Daily care of your teeth and gums. Regular physical activity. Eating a healthy diet. Avoiding tobacco and drug use. Limiting alcohol use. Practicing safe sex. Taking low doses of aspirin every day. Taking vitamin and mineral supplements as recommended by your health care provider. What happens during an annual well check? The services and screenings done by your health care provider during your annual well check will depend on your age, overall health, lifestyle risk factors, and family  history of disease. Counseling  Your health care provider may ask you questions about your: Alcohol use. Tobacco use. Drug use. Emotional well-being. Home and relationship well-being. Sexual activity. Eating habits. History of falls. Memory and ability to understand (cognition). Work and work Statistician. Screening  You may have the following tests or measurements: Height, weight, and BMI. Blood pressure. Lipid and cholesterol levels. These may be checked every 5 years, or more frequently if you are over 83 years old. Skin check. Lung cancer screening. You may have this screening every year starting at age 83 if you have a 30-pack-year history of smoking and currently smoke or have quit within the past 15 years. Fecal occult blood test (FOBT) of the stool. You may have this test every year starting at age 83. Flexible sigmoidoscopy or colonoscopy. You may have a sigmoidoscopy every 5 years or a colonoscopy every 10 years starting at age 83. Prostate cancer screening. Recommendations will vary depending on your family history and other risks. Hepatitis C blood test. Hepatitis B blood test. Sexually transmitted disease (STD) testing. Diabetes screening. This is done by checking your blood sugar (glucose) after you have not eaten for a while (fasting). You may have this done every 1-3 years. Abdominal aortic aneurysm (AAA) screening. You may need this if you are a current or former smoker. Osteoporosis. You may be screened starting at age 83 if you are at high risk. Talk with your health care provider about your test results, treatment options, and if necessary, the need for more tests. Vaccines  Your health care provider may recommend certain vaccines, such as: Influenza vaccine. This is recommended every year. Tetanus, diphtheria, and acellular pertussis (Tdap,  Td) vaccine. You may need a Td booster every 10 years. Zoster vaccine. You may need this after age 83. Pneumococcal  13-valent conjugate (PCV13) vaccine. One dose is recommended after age 83. Pneumococcal polysaccharide (PPSV23) vaccine. One dose is recommended after age 83. Talk to your health care provider about which screenings and vaccines you need and how often you need them. This information is not intended to replace advice given to you by your health care provider. Make sure you discuss any questions you have with your health care provider. Document Released: 12/14/2015 Document Revised: 08/06/2016 Document Reviewed: 09/18/2015 Elsevier Interactive Patient Education  2017 [place] Prevention in the Home Falls can cause injuries. They can happen to people of all ages. There are many things you can do to make your home safe and to help prevent falls. What can I do on the outside of my home? Regularly fix the edges of walkways and driveways and fix any cracks. Remove anything that might make you trip as you walk through a door, such as a raised step or threshold. Trim any bushes or trees on the path to your home. Use bright outdoor lighting. Clear any walking paths of anything that might make someone trip, such as rocks or tools. Regularly check to see if handrails are loose or broken. Make sure that both sides of any steps have handrails. Any raised decks and porches should have guardrails on the edges. Have any leaves, snow, or ice cleared regularly. Use sand or salt on walking paths during winter. Clean up any spills in your garage right away. This includes oil or grease spills. What can I do in the bathroom? Use night lights. Install grab bars by the toilet and in the tub and shower. Do not use towel bars as grab bars. Use non-skid mats or decals in the tub or shower. If you need to sit down in the shower, use a plastic, non-slip stool. Keep the floor dry. Clean up any water that spills on the floor as soon as it happens. Remove soap buildup in the tub or shower regularly. Attach  bath mats securely with double-sided non-slip rug tape. Do not have throw rugs and other things on the floor that can make you trip. What can I do in the bedroom? Use night lights. Make sure that you have a light by your bed that is easy to reach. Do not use any sheets or blankets that are too big for your bed. They should not hang down onto the floor. Have a firm chair that has side arms. You can use this for support while you get dressed. Do not have throw rugs and other things on the floor that can make you trip. What can I do in the kitchen? Clean up any spills right away. Avoid walking on wet floors. Keep items that you use a lot in easy-to-reach places. If you need to reach something above you, use a strong step stool that has a grab bar. Keep electrical cords out of the way. Do not use floor polish or wax that makes floors slippery. If you must use wax, use non-skid floor wax. Do not have throw rugs and other things on the floor that can make you trip. What can I do with my stairs? Do not leave any items on the stairs. Make sure that there are handrails on both sides of the stairs and use them. Fix handrails that are broken or loose. Make sure that handrails are as  long as the stairways. Check any carpeting to make sure that it is firmly attached to the stairs. Fix any carpet that is loose or worn. Avoid having throw rugs at the top or bottom of the stairs. If you do have throw rugs, attach them to the floor with carpet tape. Make sure that you have a light switch at the top of the stairs and the bottom of the stairs. If you do not have them, ask someone to add them for you. What else can I do to help prevent falls? Wear shoes that: Do not have high heels. Have rubber bottoms. Are comfortable and fit you well. Are closed at the toe. Do not wear sandals. If you use a stepladder: Make sure that it is fully opened. Do not climb a closed stepladder. Make sure that both sides of the  stepladder are locked into place. Ask someone to hold it for you, if possible. Clearly mark and make sure that you can see: Any grab bars or handrails. First and last steps. Where the edge of each step is. Use tools that help you move around (mobility aids) if they are needed. These include: Canes. Walkers. Scooters. Crutches. Turn on the lights when you go into a dark area. Replace any light bulbs as soon as they burn out. Set up your furniture so you have a clear path. Avoid moving your furniture around. If any of your floors are uneven, fix them. If there are any pets around you, be aware of where they are. Review your medicines with your doctor. Some medicines can make you feel dizzy. This can increase your chance of falling. Ask your doctor what other things that you can do to help prevent falls. This information is not intended to replace advice given to you by your health care provider. Make sure you discuss any questions you have with your health care provider. Document Released: 09/13/2009 Document Revised: 04/24/2016 Document Reviewed: 12/22/2014 Elsevier Interactive Patient Education  2017 [employer].

## 2022-02-07 NOTE — Progress Notes (Signed)
Subjective:   Michael Evans is a 81 y.o. male who presents for an Initial Medicare Annual Wellness Visit.  Review of Systems     Cardiac Risk Factors include: advanced age (>67mn, >>61women);hypertension;male gender;sedentary lifestyle;Other (see comment), Risk factor comments: COPD with Oxygen use.     Objective:    Today's Vitals   02/07/22 0851 02/07/22 0855  BP: (!) 118/56   Pulse: 65   SpO2: 100%   Weight: 137 lb (62.1 kg)   Height: 5' 5"  (1.651 m)   PainSc:  0-No pain   Body mass index is 22.8 kg/m.  Advanced Directives 02/07/2022 01/27/2022 12/09/2021 12/09/2021 12/06/2021 11/19/2021 10/17/2021  Does Patient Have a Medical Advance Directive? No No No - No No No  Type of Advance Directive - - - - - - -  Does patient want to make changes to medical advance directive? - - - - - - -  [place]? - - - - - - -  Would patient like information on creating a medical advance directive? No - Patient declined - Yes (Inpatient - patient requests chaplain consult to create a medical advance directive) No - Patient declined No - Patient declined No - Patient declined No - Patient declined  Pre-existing out of facility DNR order (yellow form or pink MOST form) - - - - - - -    Current Medications (verified) Outpatient Encounter Medications as of 02/07/2022  Medication Sig   albuterol (VENTOLIN HFA) 108 (90 Base) MCG/ACT inhaler Inhale 1-2 puffs into the lungs every 6 (six) hours as needed for wheezing or shortness of breath.   Ascorbic Acid (VITAMIN C) 1000 MG tablet Take 1,000 mg by mouth daily.   Budeson-Glycopyrrol-Formoterol (BREZTRI AEROSPHERE) 160-9-4.8 MCG/ACT AERO Inhale 2 puffs into the lungs in the morning and at bedtime.   cholecalciferol (VITAMIN D) 25 MCG (1000 UNIT) tablet Take 3,000 Units by mouth daily.   cyanocobalamin 2000 MCG tablet Take 1,000 mcg by mouth daily.   dextromethorphan-guaiFENesin (MUCINEX DM) 30-600 MG 12hr tablet Take 2  tablets by mouth 2 (two) times daily as needed for cough (sputum).   ipratropium-albuterol (DUONEB) 0.5-2.5 (3) MG/3ML SOLN Take by nebulization.   Multiple Vitamin (MULTIVITAMIN) tablet Take 1 tablet by mouth daily. Men's health   Nebulizer MISC 1 each by Does not apply route daily as needed. PLEASE PROVIDE NEBULIZER KIT TO INCLUDE TUBING/MASK/MOUTHPIECE   Omega-3 Fatty Acids (FISH OIL CONCENTRATE PO) Take 1 capsule by mouth daily.   ondansetron (ZOFRAN ODT) 8 MG disintegrating tablet Take 1 tablet (8 mg total) by mouth every 8 (eight) hours as needed for nausea or vomiting.   oxyCODONE (OXY IR/ROXICODONE) 5 MG immediate release tablet Take 1 tablet (5 mg total) by mouth every 4 (four) hours as needed for severe pain or breakthrough pain.   OXYGEN Inhale 2 L into the lungs continuous.   pantoprazole (PROTONIX) 40 MG tablet Take by mouth.   pyridoxine (B-6) 100 MG tablet Take 100 mg by mouth daily.   No facility-administered encounter medications on file as of 02/07/2022.    Allergies (verified) Amlodipine, Aspirin, Other, Penicillins, and Sulfa antibiotics   History: Past Medical History:  Diagnosis Date   Arthritis    Chronic rhinitis    COPD (chronic obstructive pulmonary disease) (HCC)    PFT 06-26-09 FEV1  1.75( 71%), FVC 3.65( 101%), FEV1% 48, TLC 5.53(104%), DLCO 55%, no BD   Hypertension    Nasal septal deviation  Nocturia    On home O2    2L N/C   OSA (obstructive sleep apnea) 04/21/2011   Skin cancer    Past Surgical History:  Procedure Laterality Date   APPENDECTOMY     BACK SURGERY     CATARACT EXTRACTION W/PHACO  01/06/2013   Procedure: CATARACT EXTRACTION PHACO AND INTRAOCULAR LENS PLACEMENT ([place]);  Surgeon: Tonny Branch, MD;  Location: [facility];  Service: Ophthalmology;  Laterality: Right;  CDE: 22.17   INGUINAL HERNIA REPAIR Left 12/09/2021   Procedure: HERNIA REPAIR INGUINAL ADULT WITH MESH;  Surgeon: [name], MD;  Location: [facility];  Service: General;   Laterality: Left;   Family History  Problem Relation Age of Onset   Lung cancer Mother    Social History   Socioeconomic History   Marital status: Divorced    Spouse name: Not on file   Number of children: 3   Years of education: Not on file   Highest education level: Not on file  Occupational History   Occupation: Neurosurgeon: RETIRED  Tobacco Use   Smoking status: Former    Packs/day: 1.50    Years: 54.00    Pack years: 81.00    Types: Cigarettes    Quit date: 12/01/2001    Years since quitting: 20.2   Smokeless tobacco: Never  Vaping Use   Vaping Use: Never used  Substance and Sexual Activity   Alcohol use: No   Drug use: No   Sexual activity: Not on file  Other Topics Concern   Not on file  Social History Narrative   Lives with sister.    Social Determinants of Health   Financial Resource Strain: Low Risk    Difficulty of Paying Living Expenses: Not hard at all  Food Insecurity: No Food Insecurity   Worried About Charity fundraiser in the Last Year: Never true   [place] in the Last Year: Never true  Transportation Needs: No Transportation Needs   Lack of Transportation (Medical): No   Lack of Transportation (Non-Medical): No  Physical Activity: Insufficiently Active   Days of Exercise per Week: 3 days   Minutes of Exercise per Session: 20 min  Stress: No Stress Concern Present   Feeling of Stress : Not at all  Social Connections: Socially Isolated   Frequency of Communication with Friends and Family: More than three times a week   Frequency of Social Gatherings with Friends and Family: More than three times a week   Attends Religious Services: Never   Marine scientist or Organizations: No   Attends Music therapist: Never   Marital Status: Divorced    Tobacco Counseling Counseling given: Not Answered   Clinical Intake:  Pre-visit preparation completed: Yes  Pain : No/denies pain Pain Score: 0-No pain      BMI - recorded: 22.8 Nutritional Risks: None Diabetes: No  How often do you need to have someone help you when you read instructions, pamphlets, or other written materials from your doctor or pharmacy?: 1 - Never  Diabetic?No  Interpreter Needed?: No  Information entered by :: mj Kyndall Chaplin ,lpn   Activities of Daily Living In your present state of health, do you have any difficulty performing the following activities: 02/07/2022 12/09/2021  Hearing? Y N  Vision? N N  Difficulty concentrating or making decisions? N N  Walking or climbing stairs? N Y  Dressing or bathing? N N  Doing errands, shopping?  N N  Preparing Food and eating ? N -  Using the Toilet? N -  In the past six months, have you accidently leaked urine? N -  Comment Has trouble, "making water." per pt. -  Do you have problems with loss of bowel control? N -  Managing your Medications? N -  Managing your Finances? N -  Housekeeping or managing your Housekeeping? N -  Some recent data might be hidden    Patient Care Team: [name], MD as PCP - General (Family Medicine) Edythe Clarity, [facility] as Pharmacist (Pharmacist)  Indicate any recent Medical Services you may have received from other than [facility] providers in the past year (date may be approximate).     Assessment:   This is a routine wellness examination for [place].  Hearing/Vision screen Hearing Screening - Comments:: HOH. Has hearing aids. Vision Screening - Comments:: Glasses. Has cataracts. [facility], appt 04/24/2022  Dietary issues and exercise activities discussed: Current Exercise Habits: Home exercise routine, Type of exercise: walking, Time (Minutes): 30, Frequency (Times/Week): 3, Weekly Exercise (Minutes/Week): 90, Intensity: Mild, Exercise limited by: cardiac condition(s);respiratory conditions(s)   Goals Addressed             This Visit's Progress    Exercise 3x per week (30 min per time)       Try to exercise  and eat  healthy.       Depression Screen PHQ 2/9 Scores 02/07/2022 12/07/2020 01/05/2020 10/03/2015 03/06/2014  PHQ - 2 Score 0 0 0 0 0  PHQ- 9 Score - - - 0 -    Fall Risk Fall Risk  02/07/2022 10/08/2021 12/07/2020 01/05/2020 06/22/2017  Falls in the past year? 0 0 0 0 No  Comment - - - - Emmi Telephone Survey: data to providers prior to load  Number falls in past yr: 0 - 0 - -  Injury with Fall? 0 - 0 - -  Risk for fall due to : Impaired balance/gait;Impaired mobility - - - -  Follow up Falls prevention discussed Falls evaluation completed - Falls evaluation completed -    FALL RISK PREVENTION PERTAINING TO THE HOME:  Any stairs in or around the home? Yes  If so, are there any without handrails? No  Home free of loose throw rugs in walkways, pet beds, electrical cords, etc? Yes  Adequate lighting in your home to reduce risk of falls? Yes   ASSISTIVE DEVICES UTILIZED TO PREVENT FALLS:  Life alert? No  Use of a cane, walker or w/c? No  Grab bars in the bathroom? Yes  Shower chair or bench in shower? No  Elevated toilet seat or a handicapped toilet? Yes   TIMED UP AND GO:  Was the test performed? Yes .  Length of time to ambulate 10 feet: 14 sec.   Gait slow and steady with assistive device  Cognitive Function:     6CIT Screen 02/07/2022  What Year? 0 points  What month? 0 points  What time? 0 points  Count back from 20 0 points  Months in reverse 4 points  Repeat phrase 10 points  Total Score 14    Immunizations Immunization History  Administered Date(s) Administered   Fluad Quad(high Dose 65+) 08/23/2019, 12/07/2020, 11/29/2021   Influenza Whole 09/18/2009, 11/27/2010   Influenza, High Dose Seasonal PF 09/21/2017, 11/04/2018   Influenza,inj,Quad PF,6+ Mos 09/13/2013, 01/23/2016, 08/15/2016   Moderna Sars-Covid-2 Vaccination 02/06/2020, 03/07/2020   Pneumococcal Polysaccharide-23 09/18/2009, 01/23/2016    TDAP  status: Due, Education has been provided regarding the  importance of this vaccine. Advised may receive this vaccine at local pharmacy or Health Dept. Aware to provide a copy of the vaccination record if obtained from local pharmacy or Health Dept. Verbalized acceptance and understanding.  Flu Vaccine status: Up to date  Pneumococcal vaccine status: Up to date  Covid-19 vaccine status: Completed vaccines  Qualifies for Shingles Vaccine? Yes   Zostavax completed No   Shingrix Completed?: No.    Education has been provided regarding the importance of this vaccine. Patient has been advised to call insurance company to determine out of pocket expense if they have not yet received this vaccine. Advised may also receive vaccine at local pharmacy or Health Dept. Verbalized acceptance and understanding.  Screening Tests Health Maintenance  Topic Date Due   TETANUS/TDAP  Never done   COVID-19 Vaccine (3 - Moderna risk series) 04/04/2020   Zoster Vaccines- Shingrix (1 of 2) 02/09/2023 (Originally 01/18/1960)   INFLUENZA VACCINE  Completed   HPV VACCINES  Aged Out   Pneumonia Vaccine 27+ Years old  Discontinued    Health Maintenance  Health Maintenance Due  Topic Date Due   TETANUS/TDAP  Never done   COVID-19 Vaccine (3 - Moderna risk series) 04/04/2020    Colorectal cancer screening: No longer required.   Lung Cancer Screening: (Low Dose CT Chest recommended if Age 2-80 years, 30 pack-year currently smoking OR have quit w/in 15years.) does not qualify.   Additional Screening:  Hepatitis C Screening: does not qualify;  Vision Screening: Recommended annual ophthalmology exams for early detection of glaucoma and other disorders of the eye. Is the patient up to date with their annual eye exam?  Yes  Who is the provider or what is the name of the office in which the patient attends annual eye exams? [facility] If pt is not established with a provider, would they like to be referred to a provider to establish care? No .   Dental  Screening: Recommended annual dental exams for proper oral hygiene  Community Resource Referral / Chronic Care Management: CRR required this visit?  No   CCM required this visit?  No      Plan:     I have personally reviewed and noted the following in the patients chart:   Medical and social history Use of alcohol, tobacco or illicit drugs  Current medications and supplements including opioid prescriptions. Patient is currently taking opioid prescriptions. Information provided to patient regarding non-opioid alternatives. Patient advised to discuss non-opioid treatment plan with their provider. Functional ability and status Nutritional status Physical activity Advanced directives List of other physicians Hospitalizations, surgeries, and ER visits in previous 12 months Vitals Screenings to include cognitive, depression, and falls Referrals and appointments  In addition, I have reviewed and discussed with patient certain preventive protocols, quality metrics, and best practice recommendations. A written personalized care plan for preventive services as well as general preventive health recommendations were provided to patient.     Chriss Driver, LPN   [id number]   Nurse Notes: Pt is up to date on age appropriate health maintenance. Discussed shingrix and how to obtain. 6CIT score of 14.

## 2022-02-11 ENCOUNTER — Ambulatory Visit (INDEPENDENT_AMBULATORY_CARE_PROVIDER_SITE_OTHER): Payer: Medicare HMO | Admitting: Family Medicine

## 2022-02-11 ENCOUNTER — Other Ambulatory Visit: Payer: Self-pay

## 2022-02-11 ENCOUNTER — Encounter: Payer: Self-pay | Admitting: Family Medicine

## 2022-02-11 VITALS — BP 138/88 | HR 71 | Temp 97.2°F | Resp 18 | Ht 65.0 in | Wt 137.0 lb

## 2022-02-11 DIAGNOSIS — L84 Corns and callosities: Secondary | ICD-10-CM | POA: Diagnosis not present

## 2022-02-11 NOTE — Progress Notes (Signed)
? ?Subjective:  ? ? Patient ID: Michael Evans, male    DOB: 1941/06/13, 81 y.o.   MRN: [id number] ? ?Patient presents today complaining of a painful nodule on the palmar surface of the right hand directly over the second MCP joint.  On examination today there is a thick hard yellow callus that is formed just proximal to the flexor crease of the second MCP joint this approximately 4 mm in diameter.  It is hard and tender to touch.  There is no obvious foreign body.  There is no erythema.  There is no evidence of any secondary cellulitis or fluctuance.  Patient states it hurts whenever he uses a shovel or any kind of pull. ?Past Medical History:  ?Diagnosis Date  ?? Arthritis   ?? Chronic rhinitis   ?? COPD (chronic obstructive pulmonary disease) ([place])   ? PFT 06-26-09 FEV1  1.75( 71%), FVC 3.65( 101%), FEV1% 48, TLC 5.53(104%), DLCO 55%, no BD  ?? Hypertension   ?? Nasal septal deviation   ?? Nocturia   ?? On home O2   ? 2L N/C  ?? OSA (obstructive sleep apnea) 04/21/2011  ?? Skin cancer   ? ?Past Surgical History:  ?Procedure Laterality Date  ?? APPENDECTOMY    ?? BACK SURGERY    ?? CATARACT EXTRACTION W/PHACO  01/06/2013  ? Procedure: CATARACT EXTRACTION PHACO AND INTRAOCULAR LENS PLACEMENT ([facility]);  Surgeon: Tonny Branch, MD;  Location: [facility];  Service: Ophthalmology;  Laterality: Right;  CDE: 22.17  ?? INGUINAL HERNIA REPAIR Left 12/09/2021  ? Procedure: HERNIA REPAIR INGUINAL ADULT WITH MESH;  Surgeon: [name], MD;  Location: [facility];  Service: General;  Laterality: Left;  ? ?Current Outpatient Medications on File Prior to Visit  ?Medication Sig Dispense Refill  ?? albuterol (VENTOLIN HFA) 108 (90 Base) MCG/ACT inhaler Inhale 1-2 puffs into the lungs every 6 (six) hours as needed for wheezing or shortness of breath. 1 each 3  ?? Ascorbic Acid (VITAMIN C) 1000 MG tablet Take 1,000 mg by mouth daily.    ?? Budeson-Glycopyrrol-Formoterol (BREZTRI AEROSPHERE) 160-9-4.8 MCG/ACT AERO Inhale 2 puffs into the lungs in  the morning and at bedtime.    ?? cholecalciferol (VITAMIN D) 25 MCG (1000 UNIT) tablet Take 3,000 Units by mouth daily.    ?? cyanocobalamin 2000 MCG tablet Take 1,000 mcg by mouth daily.    ?? dextromethorphan-guaiFENesin (MUCINEX DM) 30-600 MG 12hr tablet Take 2 tablets by mouth 2 (two) times daily as needed for cough (sputum). 30 tablet 0  ?? ipratropium-albuterol (DUONEB) 0.5-2.5 (3) MG/3ML SOLN Take by nebulization.    ?? Multiple Vitamin (MULTIVITAMIN) tablet Take 1 tablet by mouth daily. Men's health    ?? Nebulizer MISC 1 each by Does not apply route daily as needed. PLEASE PROVIDE NEBULIZER KIT TO INCLUDE TUBING/MASK/MOUTHPIECE 1 each 3  ?? Omega-3 Fatty Acids (FISH OIL CONCENTRATE PO) Take 1 capsule by mouth daily.    ?? ondansetron (ZOFRAN ODT) 8 MG disintegrating tablet Take 1 tablet (8 mg total) by mouth every 8 (eight) hours as needed for nausea or vomiting. 20 tablet 0  ?? oxyCODONE (OXY IR/ROXICODONE) 5 MG immediate release tablet Take 1 tablet (5 mg total) by mouth every 4 (four) hours as needed for severe pain or breakthrough pain. 30 tablet 0  ?? OXYGEN Inhale 2 L into the lungs continuous.    ?? pantoprazole (PROTONIX) 40 MG tablet Take by mouth.    ?? pyridoxine (B-6) 100 MG tablet Take 100 mg  by mouth daily.    ? ?No current facility-administered medications on file prior to visit.  ? ?Allergies  ?Allergen Reactions  ?? Amlodipine Swelling  ?  Swelling in feet  ?? Aspirin Other (See Comments)  ?  Upset stomach.  ?? Other Hypertension  ?  Hazelnuts  ?? Penicillins Hives  ?  Has patient had a PCN reaction causing immediate rash, facial/tongue/throat swelling, SOB or lightheadedness with hypotension: Yes ?Has patient had a PCN reaction causing severe rash involving mucus membranes or skin necrosis: No ?Has patient had a PCN reaction that required hospitalization No ?Has patient had a PCN reaction occurring within the last 10 years: No ?If all of the above answers are "NO", then may proceed with  Cephalosporin use. ?  ?? Sulfa Antibiotics Hives  ? ?Social History  ? ?Socioeconomic History  ?? Marital status: Divorced  ?  Spouse name: Not on file  ?? Number of children: 3  ?? Years of education: Not on file  ?? Highest education level: Not on file  ?Occupational History  ?? Occupation: Equities trader  ?  Employer: RETIRED  ?Tobacco Use  ?? Smoking status: Former  ?  Packs/day: 1.50  ?  Years: 54.00  ?  Pack years: 81.00  ?  Types: Cigarettes  ?  Quit date: 12/01/2001  ?  Years since quitting: 20.2  ?? Smokeless tobacco: Never  ?Vaping Use  ?? Vaping Use: Never used  ?Substance and Sexual Activity  ?? Alcohol use: No  ?? Drug use: No  ?? Sexual activity: Not on file  ?Other Topics Concern  ?? Not on file  ?Social History Narrative  ? Lives with sister.   ? ?Social Determinants of Health  ? ?Financial Resource Strain: Low Risk   ?? Difficulty of Paying Living Expenses: Not hard at all  ?Food Insecurity: No Food Insecurity  ?? Worried About Charity fundraiser in the Last Year: Never true  ?? Ran Out of Food in the Last Year: Never true  ?Transportation Needs: No Transportation Needs  ?? Lack of Transportation (Medical): No  ?? Lack of Transportation (Non-Medical): No  ?Physical Activity: Insufficiently Active  ?? Days of Exercise per Week: 3 days  ?? Minutes of Exercise per Session: 20 min  ?Stress: No Stress Concern Present  ?? Feeling of Stress : Not at all  ?Social Connections: Socially Isolated  ?? Frequency of Communication with Friends and Family: More than three times a week  ?? Frequency of Social Gatherings with Friends and Family: More than three times a week  ?? Attends Religious Services: Never  ?? Active Member of Clubs or Organizations: No  ?? Attends Archivist Meetings: Never  ?? Marital Status: Divorced  ?Intimate Partner Violence: Not At Risk  ?? Fear of Current or Ex-Partner: No  ?? Emotionally Abused: No  ?? Physically Abused: No  ?? Sexually Abused: No  ? ? ? ?Review of Systems  ?All  other systems reviewed and are negative. ? ?   ?Objective:  ? Physical Exam ?Constitutional:   ?   Appearance: Normal appearance.  ?Cardiovascular:  ?   Rate and Rhythm: Normal rate and regular rhythm.  ?Pulmonary:  ?   Effort: No respiratory distress.  ?   Breath sounds: Wheezing present.  ?Musculoskeletal:  ?   Left hand: Tenderness present.  ?     Hands: ? ?Neurological:  ?   Mental Status: He is alert.  ? ? ? ? ? ?   ?  Assessment & Plan:  ?Callus of palm of hand ?Reassured the patient that the lesion is a callus.  There is no evidence of any foreign body.  Recommended Dr. Felicie Morn corn and callus remover be applied daily to the affected area until removed.  He can also use work below.  Any increased friction and pain when he is using tools such as a shovel. ?

## 2022-02-13 NOTE — Progress Notes (Signed)
? ?Chronic Care Management ?Pharmacy Note ? ?02/20/2022 ?Name:  Michael Evans MRN:  [id number] DOB:  10/28/1941 ? ?Summary: ?Initial visit with PharmD.  Patient really only uses inhalers no other Rx meds.  Since starting Breztri breathing has improved.  Copay manageable at this time as he is on samples.  Stressed adherence. ? ?Recommendations/Changes made from today's visit: ?No changes - contact me if needed to help with copay ? ?Plan: ?FU 6 months ? ? ?Subjective: ?Michael Evans is an 81 y.o. year old male who is a primary patient of Pickard, Cammie Mcgee, MD.  The CCM team was consulted for assistance with disease management and care coordination needs.   ? ?Engaged with patient by telephone for initial visit in response to provider referral for pharmacy case management and/or care coordination services.  ? ?Consent to Services:  ?The patient was given the following information about Chronic Care Management services today, agreed to services, and gave verbal consent: 1. CCM service includes personalized support from designated clinical staff supervised by the primary care provider, including individualized plan of care and coordination with other care providers 2. 24/7 contact phone numbers for assistance for urgent and routine care needs. 3. Service will only be billed when office clinical staff spend 20 minutes or more in a month to coordinate care. 4. Only one practitioner may furnish and bill the service in a calendar month. 5.The patient may stop CCM services at any time (effective at the end of the month) by phone call to the office staff. 6. The patient will be responsible for cost sharing (co-pay) of up to 20% of the service fee (after annual deductible is met). Patient agreed to services and consent obtained. ? ?Patient Care Team: ?[name], MD as PCP - General (Family Medicine) ?Edythe Clarity, [facility] as Pharmacist (Pharmacist) ? ?Recent office visits:  ?02/11/2022 OV (PCP) [name], MD; no  medication changes indicated. ?  ?10/17/2021 OV (PCP) [name], MD; no medication changes indicated. ?  ?10/11/2021 OV (PCP) [name], MD; discontinue Symbicort and we will replace it with Breztri 2 inhalations twice daily.  I want to place the patient back on prednisone 40 mg daily for 7 days due to persistent wheezing and congestion and purulent sputum.  I want him to do albuterol 2 puffs every 6 hours on a scheduled basis and have his sister administer the medication to him and then reassess him in 1 week to see if his breathing is improving. ?  ?10/04/2021 OV (PCP) [name], MD; Patient received 80 mg of Depo-Medrol x1 now.  Begin prednisone 60 mg a day for the next week.  Start Avelox 400 mg p.o. daily for the next week given his increasing and changing sputum consistency.  Use albuterol every 4-6 hours for wheezing.  Continue home oxygen.  If breathing worsens, he is instructed to go the emergency room ?  ?Recent consult visits:  ?01/07/2022 OV (General Surgery) [name], MD; No medication changes indicated. ?  ?11/29/2021 OV (Pulmonology) Parrett, Fonnie Mu, NP; Continue on BREZTRI 2 puffs Twice daily , rinse after use.  ?Albuterol inhaler or neb every 6hr as needed.  ?Mucinex DM Twice daily  As needed  cough/congestion  ?Restart Flutter valve Twice daily  .  ?Continue on Oxygen 2l/m .  ?Activity as tolerated.  ?  ?11/21/2021 OV (General Surgery) [name], MD; No medication changes indicated. ?  ?11/14/2021 [facility] (General Surgery) [name],  MD; No medication changes indicated. ?  ?10/31/2021 OV (Pulmonology) Tanda Rockers, MD; no medication changes indicated. ?  ?10/08/2021 OV (General Surgery) [name], MD; no medication changes indicated. ?  ?Hospital visits:  ?01/27/2022 ED visit for Unilateral inguinal hernia without obstruction or gangrene at [facility] Emergency Department ?-No medication changes indicated. ?  ?12/09/2021 Admission for  Surgery, left inguinal hernia repair ?-Rx oxycodone 5 mg q4hrs prn for severe pain ?  ?11/19/2021 ED visit for Left inguinal hernia ?-No medication changes indicated. ?  ?10/17/2021 ED to Hospital Admission due to Small bowl obstruction at [place] date: 10/17/2021 ?Discharge date: 10/20/2021 ?-STOP taking prednisone. ?-All other medication remain the same. ?  ?09/24/2021 ED to Hospital Admission due to COPD exacerbation ?Admit date: 09/24/2021 ?Discharge date: 09/29/2021 ?-STOP taking these medications ?guaiFENesin-codeine 100-10 mg/46m syrup ?  ?09/17/2021 ED visit for Chronic obstructive pulmonary disease with acute exacerbation ?-Rx Azithromyin 250 mg daily ?-Rx guaiFENesin-Codeine 100-10 mg/5 mL q6hrs prn ? ?Objective: ? ?Lab Results  ?Component Value Date  ? CREATININE 0.86 01/27/2022  ? BUN 26 (H) 01/27/2022  ? EGFR 73 10/04/2021  ? GFRNONAA >60 01/27/2022  ? GFRAA 95 11/11/2018  ? NA 140 01/27/2022  ? K 4.4 01/27/2022  ? CALCIUM 9.9 01/27/2022  ? CO2 27 01/27/2022  ? GLUCOSE 131 (H) 01/27/2022  ? ? ?Lab Results  ?Component Value Date/Time  ? HGBA1C 6.7 (H) 09/26/2021 05:38 AM  ? HGBA1C CANCELED 11/11/2018 12:15 PM  ?  ?Last diabetic Eye exam: No results found for: HMDIABEYEEXA  ?Last diabetic Foot exam: No results found for: HMDIABFOOTEX  ? ?No results found for: CHOL, HDL, LDLCALC, LDLDIRECT, TRIG, CHOLHDL ? ? ?  Latest Ref Rng & Units 01/27/2022  ?  3:01 PM 11/19/2021  ?  5:59 PM 10/19/2021  ?  6:31 AM  ?Hepatic Function  ?Total Protein 6.5 - 8.1 g/dL 7.3   7.2   5.0    ?Albumin 3.5 - 5.0 g/dL 4.3   4.2   2.7    ?AST 15 - 41 U/L _0 ?ALT 0 - 44 U/L _1 ?Alk Phosphatase 38 - 126 U/L 55   56   39    ?Total Bilirubin 0.3 - 1.2 mg/dL 0.7   0.6   1.1    ? ? ?Lab Results  ?Component Value Date/Time  ? TSH 3.226 01/22/2016 12:12 AM  ? ? ? ?  Latest Ref Rng & Units 01/27/2022  ?  3:01 PM 11/19/2021  ?  5:59 PM 10/19/2021  ?  6:31 AM  ?CBC  ?WBC 4.0 - 10.5 K/uL 8.9   12.3    9.8    ?Hemoglobin 13.0 - 17.0 g/dL 15.3   15.4   14.3    ?Hematocrit 39.0 - 52.0 % 46.9   46.2   44.4    ?Platelets 150 - 400 K/uL 226   242   182    ? ? ?No results found for: VD25OH ? ?Clinical ASCVD: No  ?The ASCVD Risk score (Arnett DK, et al., 2019) failed to calculate for the following reasons: ?  The 2019 ASCVD risk score is only valid for ages 430to 752  ? ? ?  02/07/2022  ?  8:57 AM 12/07/2020  ? 11:32 AM 01/05/2020  ?  2:35 PM  ?Depression screen PHQ 2/9  ?Decreased Interest 0 0 0  ?  Down, Depressed, Hopeless 0 0 0  ?PHQ - 2 Score 0 0 0  ?  ? ?Social History  ? ?Tobacco Use  ?Smoking Status Former  ? Packs/day: 1.50  ? Years: 54.00  ? Pack years: 81.00  ? Types: Cigarettes  ? Quit date: 12/01/2001  ? Years since quitting: 20.2  ?Smokeless Tobacco Never  ? ?BP Readings from Last 3 Encounters:  ?02/11/22 138/88  ?02/07/22 (!) 118/56  ?01/27/22 135/88  ? ?Pulse Readings from Last 3 Encounters:  ?02/11/22 71  ?02/07/22 65  ?01/27/22 65  ? ?Wt Readings from Last 3 Encounters:  ?02/11/22 137 lb (62.1 kg)  ?02/07/22 137 lb (62.1 kg)  ?01/27/22 150 lb (68 kg)  ? ?BMI Readings from Last 3 Encounters:  ?02/11/22 22.80 kg/m?  ?02/07/22 22.80 kg/m?  ?01/27/22 24.96 kg/m?  ? ? ?Assessment/Interventions: Review of patient past medical history, allergies, medications, health status, including review of consultants reports, laboratory and other test data, was performed as part of comprehensive evaluation and provision of chronic care management services.  ? ?SDOH:  (Social Determinants of Health) assessments and interventions performed: Yes ? ?Financial Resource Strain: Low Risk   ? Difficulty of Paying Living Expenses: Not hard at all  ? ?Food Insecurity: No Food Insecurity  ? Worried About Charity fundraiser in the Last Year: Never true  ? Ran Out of Food in the Last Year: Never true  ? ? ?SDOH Screenings  ? ?Alcohol Screen: Low Risk   ? Last Alcohol Screening Score (AUDIT): 0  ?Depression (PHQ2-9): Low Risk   ? PHQ-2 Score:  0  ?Financial Resource Strain: Low Risk   ? Difficulty of Paying Living Expenses: Not hard at all  ?Food Insecurity: No Food Insecurity  ? Worried About Charity fundraiser in the Last Year: Never true  ?

## 2022-02-17 ENCOUNTER — Telehealth: Payer: Self-pay | Admitting: Pharmacist

## 2022-02-17 NOTE — Progress Notes (Signed)
? ? ?Chronic Care Management ?Pharmacy Assistant  ? ?Name: Michael Evans  MRN: [id number] DOB: 12-28-1940 ? ? ?Reason for Encounter: Chart Review For Initial Visit With Clinical Pharmacist ?  ?Conditions to be addressed/monitored: ?HTN, COPD, OSA, Hyperglycemia ? ?Primary concerns for visit include: ?HTN, COPD, Hyerglycemia  ? ?Recent office visits:  ?02/11/2022 OV (PCP) [name], MD; no medication changes indicated. ? ?10/17/2021 OV (PCP) [name], MD; no medication changes indicated. ? ?10/11/2021 OV (PCP) [name], MD; discontinue Symbicort and we will replace it with Breztri 2 inhalations twice daily.  I want to place the patient back on prednisone 40 mg daily for 7 days due to persistent wheezing and congestion and purulent sputum.  I want him to do albuterol 2 puffs every 6 hours on a scheduled basis and have his sister administer the medication to him and then reassess him in 1 week to see if his breathing is improving. ? ?10/04/2021 OV (PCP) [name], MD; Patient received 80 mg of Depo-Medrol x1 now.  Begin prednisone 60 mg a day for the next week.  Start Avelox 400 mg p.o. daily for the next week given his increasing and changing sputum consistency.  Use albuterol every 4-6 hours for wheezing.  Continue home oxygen.  If breathing worsens, he is instructed to go the emergency room ? ?Recent consult visits:  ?01/07/2022 OV (General Surgery) [name], MD; No medication changes indicated. ? ?11/29/2021 OV (Pulmonology) Parrett, Fonnie Mu, NP; Continue on BREZTRI 2 puffs Twice daily , rinse after use.  ?Albuterol inhaler or neb every 6hr as needed.  ?Mucinex DM Twice daily  As needed  cough/congestion  ?Restart Flutter valve Twice daily  .  ?Continue on Oxygen 2l/m .  ?Activity as tolerated.  ? ?11/21/2021 OV (General Surgery) [name], MD; No medication changes indicated. ? ?11/14/2021 OV (General Surgery) [name], MD; No medication changes  indicated. ? ?10/31/2021 OV (Pulmonology) Tanda Rockers, MD; no medication changes indicated. ? ?10/08/2021 OV (General Surgery) [name], MD; no medication changes indicated. ? ?Hospital visits:  ?01/27/2022 ED visit for Unilateral inguinal hernia without obstruction or gangrene at [facility] Emergency Department ?-No medication changes indicated. ? ?12/09/2021 Admission for Surgery, left inguinal hernia repair ?-Rx oxycodone 5 mg q4hrs prn for severe pain ? ?11/19/2021 ED visit for Left inguinal hernia ?-No medication changes indicated. ? ?10/17/2021 ED to Hospital Admission due to Small bowl obstruction at [place] date: 10/17/2021 ?Discharge date: 10/20/2021 ?-STOP taking prednisone. ?-All other medication remain the same. ? ?09/24/2021 ED to Hospital Admission due to COPD exacerbation ?Admit date: 09/24/2021 ?Discharge date: 09/29/2021 ?-STOP taking these medications ?guaiFENesin-codeine 100-10 mg/89m syrup ? ?09/17/2021 ED visit for Chronic obstructive pulmonary disease with acute exacerbation ?-Rx Azithromyin 250 mg daily ?-Rx guaiFENesin-Codeine 100-10 mg/5 mL q6hrs prn ? ?Medications: ?Outpatient Encounter Medications as of 02/17/2022  ?Medication Sig  ? albuterol (VENTOLIN HFA) 108 (90 Base) MCG/ACT inhaler Inhale 1-2 puffs into the lungs every 6 (six) hours as needed for wheezing or shortness of breath.  ? Ascorbic Acid (VITAMIN C) 1000 MG tablet Take 1,000 mg by mouth daily.  ? Budeson-Glycopyrrol-Formoterol (BREZTRI AEROSPHERE) 160-9-4.8 MCG/ACT AERO Inhale 2 puffs into the lungs in the morning and at bedtime.  ? cholecalciferol (VITAMIN D) 25 MCG (1000 UNIT) tablet Take 3,000 Units by mouth daily.  ? cyanocobalamin 2000 MCG tablet Take 1,000 mcg by mouth daily.  ? dextromethorphan-guaiFENesin (MPelzerDM) 30-600 MG  12hr tablet Take 2 tablets by mouth 2 (two) times daily as needed for cough (sputum).  ? ipratropium-albuterol (DUONEB) 0.5-2.5 (3) MG/3ML SOLN Take by  nebulization.  ? Multiple Vitamin (MULTIVITAMIN) tablet Take 1 tablet by mouth daily. Men's health  ? Nebulizer MISC 1 each by Does not apply route daily as needed. PLEASE PROVIDE NEBULIZER KIT TO INCLUDE TUBING/MASK/MOUTHPIECE  ? Omega-3 Fatty Acids (FISH OIL CONCENTRATE PO) Take 1 capsule by mouth daily.  ? ondansetron (ZOFRAN ODT) 8 MG disintegrating tablet Take 1 tablet (8 mg total) by mouth every 8 (eight) hours as needed for nausea or vomiting.  ? oxyCODONE (OXY IR/ROXICODONE) 5 MG immediate release tablet Take 1 tablet (5 mg total) by mouth every 4 (four) hours as needed for severe pain or breakthrough pain.  ? OXYGEN Inhale 2 L into the lungs continuous.  ? pantoprazole (PROTONIX) 40 MG tablet Take by mouth.  ? pyridoxine (B-6) 100 MG tablet Take 100 mg by mouth daily.  ? ?No facility-administered encounter medications on file as of 02/17/2022.  ? ?Current Medications: ?Pantoprazole 40 mg last filled 12/12/2021 90 DS ?Duoneb 0.5-2.5 mg/mL ?Mucinex DM last filled 12/10/2021 10 DS ?Oxycodone 5 mg last filled 12/10/2021 5 DS ?Zofran 8 mg last filled 10/19/2021 6 DS ?Breztri Aerosphere not using per patient sister ?Incruse Ellipta using samples ?Dulera using samples ?Nebulizer misc ?Vitamin D 1000 units takes daily ?Vitamin C 1000 mg takes daily ?Ventolin HFA 108 mcg/act last filled 09/23/2021 29 DS ?Omega-3 Fatty Acids takes daily ?Oxygen ?Pyridoxine B-6 100 mg takes daily ?Cyanocobalamin 2000 mg takes daily ?Multiple Vitamin tablet takes daily ? ?Patient Questions: ?Any changes in your medications or health? ?Patient's sister states the patient hasn't had any recent changes in his medications or his health. ? ?Any side effects from any medications?  ?Patient's sister denies the patient having any side effects from any of his medications. ? ?Do you have any symptoms or problems not managed by your medications? ?Patient's sisters denies the patient having any symptoms or problems that is not currently managed by  his medications. ? ?Any concerns about your health right now? ?"I guess not." ? ?Has your provider asked that you check blood pressure, blood sugar, or follow special diet at home? ?Patient does not check blood sugar or blood pressure at home. He does not eat any salt, greasy foods or sweets. ? ?Do you get any type of exercise on a regular basis? ?"He does a lot of walking around." ? ?Can you think of a goal you would like to reach for your health? ?"No I do not." ? ?Do you have any problems getting your medications? ?Patient's sister states the patient doesn't have any problems getting his medications. ? ?Is there anything that you would like to discuss during the appointment?  ?"No." ? ?Please bring medications and supplements to appointment ? ?Care Gaps: ?Medicare Annual Wellness: Completed 02/07/2022 ?Hemoglobin A1C: 6.7% on 09/26/2021 ?Colonoscopy: Aged out ? ?Future Appointments  ?Date Time Provider [place]  ?02/20/2022  9:00 AM [facility]-CCM PHARMACIST [facility]  ?02/13/2023  9:00 AM [facility]-NURSE HEALTH ADVISOR [facility]  ? ?Star Rating Drugs: ?None ? ?April D Calhoun, [place] ?Clinical Pharmacist Assistant ?[phone]  ?

## 2022-02-20 ENCOUNTER — Ambulatory Visit (INDEPENDENT_AMBULATORY_CARE_PROVIDER_SITE_OTHER): Payer: Medicare HMO | Admitting: Pharmacist

## 2022-02-20 DIAGNOSIS — R739 Hyperglycemia, unspecified: Secondary | ICD-10-CM

## 2022-02-20 DIAGNOSIS — I10 Essential (primary) hypertension: Secondary | ICD-10-CM

## 2022-02-20 DIAGNOSIS — J449 Chronic obstructive pulmonary disease, unspecified: Secondary | ICD-10-CM

## 2022-02-20 NOTE — Patient Instructions (Addendum)
Visit Information ? ? Goals Addressed   ? ?  ?  ?  ?  ? This Visit's Progress  ?  Track and Manage My Symptoms-COPD     ?  Timeframe:  Long-Range Goal ?Priority:  High ?Start Date: 02/20/22                            ?Expected End Date: 08/23/22                     ? ?Follow Up Date 05/23/22  ?  ?- develop a rescue plan ?- eliminate symptom triggers at home ?- follow rescue plan if symptoms flare-up  ?  ?Why is this important?   ?Tracking your symptoms and other information about your health helps your doctor plan your care.  ?Write down the symptoms, the time of day, what you were doing and what medicine you are taking.  ?You will soon learn how to manage your symptoms.   ?  ?Notes:  ?  ? ?  ? ?Patient Care Plan: General Pharmacy (Adult)  ?  ? ?Problem Identified: HTN, COPD, Hyperglycemia   ?Priority: High  ?Onset Date: 02/20/2022  ?  ? ?Long-Range Goal: Patient-Specific Goal   ?Start Date: 02/20/2022  ?Expected End Date: 08/23/2022  ?This Visit's Progress: On track  ?Priority: High  ?Note:   ?Current Barriers:  ?Unable to maintain control of COPD ? ?Pharmacist Clinical Goal(s):  ?Patient will achieve improvement in COPD symptoms as evidenced by breathing through collaboration with PharmD and provider.  ? ?Interventions: ?1:1 collaboration with [name], MD regarding development and update of comprehensive plan of care as evidenced by provider attestation and co-signature ?Inter-disciplinary care team collaboration (see longitudinal plan of care) ?Comprehensive medication review performed; medication list updated in electronic medical record ? ?Hypertension (BP goal <140/90) ?-Controlled ?-Current treatment: ?None ?-Medications previously tried: hydralazine, losartan, HCTZ  ?-Current home readings: not checking at home ?-Current dietary habits: avoids salt, drinks mostly water.  Sister reports that his diet is very good and he does not eat anything with oils, etc. ?-Current exercise habits: walking around the  house, to mailbox and back (about 400 yards) ?-Denies hypotensive/hypertensive symptoms ?-Educated on BP goals and benefits of medications for prevention of heart attack, stroke and kidney damage; ?Daily salt intake goal < 2300 mg; ?Exercise goal of 150 minutes per week; ?Importance of home blood pressure monitoring; ?-Counseled to monitor BP at home a few times per week, document, and provide log at future appointments ?-Recommended to continue current medication ? ?COPD (Goal: control symptoms and prevent exacerbations) ?-Controlled ?-Current treatment  ?Breztri 160-9-4.8 mcg Appropriate, Effective, Safe, Accessible ?Albuterol HFA 42mg Appropriate, Effective, Safe, Accessible ?-Medications previously tried: Duoneb, combivent, Symbicort, Spiriva  ?-Gold Grade: Gold 2 (FEV1 50-79%) ?-Current COPD Classification:  A (low sx, <2 exacerbations/yr) ?-MMRC/CAT score:  ? ?  11/29/2021  ?  9:24 AM  ?CAT Score  ?Total CAT Score 25  ?-Pulmonary function testing: Pulmonary Functions Testing Results: ? ?No results found for: FEV1, FVC, FEV1FVC, TLC, DLCO ? ?-Exacerbations requiring treatment in last 6 months: One - admitted ?-Patient reports consistent use of maintenance inhaler ?-Frequency of rescue inhaler use: twice daily ?-Counseled on Proper inhaler technique; ?Benefits of consistent maintenance inhaler use ?When to use rescue inhaler ?Differences between maintenance and rescue inhalers ?-Recommended to continue current medication ?[facility]  Reports his breathing has gotten much better  since this.  He is currently on samples.  Provided my contact number for them to call in case when he goes to pick up rx the copay is a burden.  Stressed importance of adherence moving forward. ? ?Hyperglycemia (Goal: Control A1c) ?-Controlled ?-Current treatment  ?None ?-Medications previously tried: none ?-Steroids due to COPD exacerbations playing role in elevated sugars. ?Control  breathing will hopefully prevent spikes in sugar with steroids.  ?-Recommended continue current management, no changes at thist ime ? ?Patient Goals/Self-Care Activities ?Patient will:  ?- focus on medication adherence by using inhalers twice daily as directed ? ?Follow Up Plan: The care management team will reach out to the patient again over the next 180 days.  ? ?  ? ? ?Michael Evans was given information about Chronic Care Management services today including:  ?CCM service includes personalized support from designated clinical staff supervised by his physician, including individualized plan of care and coordination with other care providers ?24/7 contact phone numbers for assistance for urgent and routine care needs. ?Standard insurance, coinsurance, copays and deductibles apply for chronic care management only during months in which we provide at least 20 minutes of these services. Most insurances cover these services at 100%, however patients may be responsible for any copay, coinsurance and/or deductible if applicable. This service may help you avoid the need for more expensive face-to-face services. ?Only one practitioner may furnish and bill the service in a calendar month. ?The patient may stop CCM services at any time (effective at the end of the month) by phone call to the office staff. ? ?Patient agreed to services and verbal consent obtained.  ? ?The patient verbalized understanding of instructions, educational materials, and care plan provided today and agreed to receive a mailed copy of patient instructions, educational materials, and care plan.  ?Telephone follow up appointment with pharmacy team member scheduled for: 6 months ? ?Edythe Clarity, [facility]  ?Beverly Milch, PharmD, CPP ?Clinical Pharmacist Practitioner ?[place] ?[phone] ? ?

## 2022-02-27 ENCOUNTER — Encounter: Payer: Self-pay | Admitting: General Surgery

## 2022-02-27 ENCOUNTER — Ambulatory Visit (INDEPENDENT_AMBULATORY_CARE_PROVIDER_SITE_OTHER): Payer: Medicare HMO | Admitting: General Surgery

## 2022-02-27 VITALS — BP 137/67 | HR 62 | Temp 97.9°F | Resp 16 | Ht 65.0 in | Wt 136.0 lb

## 2022-02-27 DIAGNOSIS — K4090 Unilateral inguinal hernia, without obstruction or gangrene, not specified as recurrent: Secondary | ICD-10-CM

## 2022-02-27 NOTE — Progress Notes (Signed)
[facility] History and Physical ? ?Reason for Referral: Right inguinal hernia  ?Referring Physician:  Self ? ?Chief Complaint   ?Follow-up ?  ? ? ?Michael Evans is a 81 y.o. male.  ?HPI: Michael Evans is well known to me with bilateral inguinal hernias that had the left side repaired due to worsening pain and issues. But has had issues with the right side getting incarcerated and causing a SBO. He had this just a few weeks ago. He says that he wants to get the right side fixed. He did well with the left side repair and we had him admitted overnight to monitor given his age and chronic lung disease. He says that the right sided hernia is keeping him from doing work in his garden due to pain. He denies any obstructive symptoms.  ? ?Past Medical History:  ?Diagnosis Date  ? Arthritis   ? Chronic rhinitis   ? COPD (chronic obstructive pulmonary disease) ([place])   ? PFT 06-26-09 FEV1  1.75( 71%), FVC 3.65( 101%), FEV1% 48, TLC 5.53(104%), DLCO 55%, no BD  ? Hypertension   ? Nasal septal deviation   ? Nocturia   ? On home O2   ? 2L N/C  ? OSA (obstructive sleep apnea) 04/21/2011  ? Skin cancer   ? ? ?Past Surgical History:  ?Procedure Laterality Date  ? APPENDECTOMY    ? BACK SURGERY    ? CATARACT EXTRACTION W/PHACO  01/06/2013  ? Procedure: CATARACT EXTRACTION PHACO AND INTRAOCULAR LENS PLACEMENT (IOC);  Surgeon: Tonny Branch, MD;  Location: [facility];  Service: Ophthalmology;  Laterality: Right;  CDE: 22.17  ? INGUINAL HERNIA REPAIR Left 12/09/2021  ? Procedure: HERNIA REPAIR INGUINAL ADULT WITH MESH;  Surgeon: [name], MD;  Location: [facility];  Service: General;  Laterality: Left;  ? ? ?Family History  ?Problem Relation Age of Onset  ? Lung cancer Mother   ? ? ?Social History  ? ?Tobacco Use  ? Smoking status: Former  ?  Packs/day: 1.50  ?  Years: 54.00  ?  Pack years: 81.00  ?  Types: Cigarettes  ?  Quit date: 12/01/2001  ?  Years since quitting: 20.2  ? Smokeless tobacco: Never  ?Vaping Use  ? Vaping Use:  Never used  ?Substance Use Topics  ? Alcohol use: No  ? Drug use: No  ? ? ?Medications: I have reviewed the patient's current medications. ?Allergies as of 02/27/2022   ? ?   Reactions  ? Amlodipine Swelling  ? Swelling in feet  ? Aspirin Other (See Comments)  ? Upset stomach.  ? Other Hypertension  ? Hazelnuts  ? Penicillins Hives  ? Has patient had a PCN reaction causing immediate rash, facial/tongue/throat swelling, SOB or lightheadedness with hypotension: Yes ?Has patient had a PCN reaction causing severe rash involving mucus membranes or skin necrosis: No ?Has patient had a PCN reaction that required hospitalization No ?Has patient had a PCN reaction occurring within the last 10 years: No ?If all of the above answers are "NO", then may proceed with Cephalosporin use.  ? Sulfa Antibiotics Hives  ? ?  ? ?  ?Medication List  ?  ? ?  ? Accurate as of February 27, 2022  2:16 PM. If you have any questions, ask your nurse or doctor.  ?  ?  ? ?  ? ?albuterol 108 (90 Base) MCG/ACT inhaler ?Commonly known as: VENTOLIN HFA ?Inhale 1-2 puffs into the lungs every 6 (six) hours as  needed for wheezing or shortness of breath. ?  ?Breztri Aerosphere 160-9-4.8 MCG/ACT Aero ?Generic drug: Budeson-Glycopyrrol-Formoterol ?Inhale 2 puffs into the lungs in the morning and at bedtime. ?  ?cholecalciferol 25 MCG (1000 UNIT) tablet ?Commonly known as: VITAMIN D ?Take 3,000 Units by mouth daily. ?  ?cyanocobalamin 2000 MCG tablet ?Take 1,000 mcg by mouth daily. ?  ?dextromethorphan-guaiFENesin 30-600 MG 12hr tablet ?Commonly known as: [place] DM ?Take 2 tablets by mouth 2 (two) times daily as needed for cough (sputum). ?  ?FISH OIL CONCENTRATE PO ?Take 1 capsule by mouth daily. ?  ?ipratropium-albuterol 0.5-2.5 (3) MG/3ML Soln ?Commonly known as: DUONEB ?Take by nebulization. ?  ?multivitamin tablet ?Take 1 tablet by mouth daily. Men's health ?  ?Nebulizer Misc ?1 each by Does not apply route daily as needed. PLEASE PROVIDE NEBULIZER KIT TO  INCLUDE TUBING/MASK/MOUTHPIECE ?  ?ondansetron 8 MG disintegrating tablet ?Commonly known as: Zofran ODT ?Take 1 tablet (8 mg total) by mouth every 8 (eight) hours as needed for nausea or vomiting. ?  ?oxyCODONE 5 MG immediate release tablet ?Commonly known as: Oxy IR/ROXICODONE ?Take 1 tablet (5 mg total) by mouth every 4 (four) hours as needed for severe pain or breakthrough pain. ?  ?OXYGEN ?Inhale 2 L into the lungs continuous. ?  ?pantoprazole 40 MG tablet ?Commonly known as: PROTONIX ?Take by mouth. ?  ?pyridoxine 100 MG tablet ?Commonly known as: B-6 ?Take 100 mg by mouth daily. ?  ?vitamin C 1000 MG tablet ?Take 1,000 mg by mouth daily. ?  ? ?  ? ? ? ?ROS:  ?A comprehensive review of systems was negative except for: Gastrointestinal: positive for right inguinal pain ? ?Blood pressure 137/67, pulse 62, temperature 97.9 ?F (36.6 ?C), temperature source Other (Comment), resp. rate 16, height 5' 5" (1.651 m), weight 136 lb (61.7 kg), SpO2 95 %. ?Physical Exam ?Vitals reviewed.  ?Constitutional:   ?   Appearance: Normal appearance.  ?HENT:  ?   Head: Atraumatic.  ?   Mouth/Throat:  ?   Mouth: Mucous membranes are moist.  ?Eyes:  ?   Extraocular Movements: Extraocular movements intact.  ?Cardiovascular:  ?   Rate and Rhythm: Normal rate and regular rhythm.  ?Pulmonary:  ?   Effort: Pulmonary effort is normal.  ?   Breath sounds: Normal breath sounds.  ?Abdominal:  ?   General: There is no distension.  ?   Palpations: Abdomen is soft.  ?   Tenderness: There is no abdominal tenderness.  ?   Hernia: A hernia is present. Hernia is present in the right inguinal area. There is no hernia in the left inguinal area.  ?   Comments: Right sided reducible, incision over left inguinal no recurrence   ?Musculoskeletal:     ?   General: Normal range of motion.  ?   Cervical back: Normal range of motion.  ?Skin: ?   General: Skin is warm.  ?Neurological:  ?   General: No focal deficit present.  ?   Mental Status: He is alert.   ?Psychiatric:     ?   Mood and Affect: Mood normal.  ? ? ?Results: ? ?Personally reviewed CT- right inguinal hernia with bowel, left side repaired  ? ?CLINICAL DATA:  Right groin pain. ?  ?EXAM: ?CT ABDOMEN AND PELVIS WITH CONTRAST ?  ?TECHNIQUE: ?Multidetector CT imaging of the abdomen and pelvis was performed ?using the standard protocol following bolus administration of ?intravenous contrast. ?  ?RADIATION DOSE REDUCTION: This exam was  performed according to the ?departmental dose-optimization program which includes automated ?exposure control, adjustment of the mA and/or kV according to ?patient size and/or use of iterative reconstruction technique. ?  ?CONTRAST:  157m OMNIPAQUE IOHEXOL 300 MG/ML  SOLN ?  ?COMPARISON:  CT scan 10/18/2021 ?  ?FINDINGS: ?Lower chest: Stable emphysematous changes and pulmonary scarring. ?The heart is normal in size. No pericardial effusion. ?  ?Hepatobiliary: No hepatic lesions or intrahepatic biliary ?dilatation. The gallbladder is unremarkable. No common bile duct ?dilatation. ?  ?Pancreas: No mass, inflammation or ductal dilatation. ?  ?Spleen: Normal size.  No focal lesions. ?  ?Adrenals/Urinary Tract: Adrenal glands and kidneys are unremarkable ?and stable. Stable left renal cyst. The bladder is unremarkable. ?  ?Stomach/Bowel: The stomach, duodenum, small bowel and colon are ?unremarkable. There is a right inguinal hernia containing a slightly ?dilated fluid-filled loop of small bowel. Both limbs are somewhat ?constricted in the hernia and there are slightly dilated loops of ?small bowel proximally. No surrounding inflammatory changes or fluid ?in the hernia sac. Very similar appearance on the prior CT scan. The ?left inguinal hernia seen on the prior CT scan has been fixed ?surgically. ?  ?Vascular/Lymphatic: Stable advanced atherosclerotic calcifications ?involving the aorta and branch vessels but no focal aneurysm or ?dissection. The major venous structures are patent.  No mesenteric or ?retroperitoneal mass or adenopathy. ?  ?Reproductive: Mildly enlarged prostate gland appears stable. The ?seminal vesicles are unremarkable. ?  ?Other: No pelvic mass or adenopathy. No fre

## 2022-02-27 NOTE — Patient Instructions (Signed)
Open Hernia Repair, Adult °Open hernia repair is a surgical procedure to fix a hernia. A hernia occurs when an internal organ or tissue pushes through a weak spot in the muscles along the wall of the abdomen. Hernias commonly occur in the groin and around the belly button. °Most hernias tend to get worse over time. Often, surgery is done to prevent the hernia from becoming bigger, uncomfortable, or an emergency. Emergency surgery may be needed if contents of the abdomen get stuck in the opening (incarcerated hernia) or if the blood supply gets cut off (strangulated hernia). In an open repair, an incision is made in the abdomen to perform the surgery. °Tell a health care provider about: °Any allergies you have. °All medicines you are taking, including vitamins, herbs, eye drops, creams, and over-the-counter medicines. °Any problems you or family members have had with anesthetic medicines. °Any blood or bone disorders you have. °Any surgeries you have had. °Any medical conditions you have, including any recent cold or flu (influenza)symptoms. °Whether you are pregnant or may be pregnant. °What are the risks? °Generally, this is a safe procedure. However, problems may occur, including: °Long-lasting (chronic) pain. °Bleeding. °Infection. °Damage to the testicles. This can cause shrinking or swelling. °Damage to nearby structures or organs, including the bladder, blood vessels, intestines, or nerves near the hernia. °Blood clots. °Trouble passing urine. °Return of the hernia. °What happens before the procedure? °Medicines °Ask your health care provider about: °Changing or stopping your regular medicines. This is especially important if you are taking diabetes medicines or blood thinners. °Taking medicines such as aspirin and ibuprofen. These medicines can thin your blood. Do not take these medicines unless your health care provider tells you to take them. °Taking over-the-counter medicines, vitamins, herbs, and  supplements. °Surgery safety °Ask your health care provider: °How your surgery site will be marked. °What steps will be taken to help prevent infection. These steps may include: °Removing hair at the surgery site. °Washing skin with a germ-killing soap. °Receiving antibiotic medicine. °General instructions °You may have an exam or testing, such as blood tests or imaging studies. °Do not use any products that contain nicotine or tobacco for at least 4 weeks before the procedure. These products include cigarettes, chewing tobacco, and vaping devices, such as e-cigarettes. If you need help quitting, ask your health care provider. °Let your health care provider know if you develop a cold or any infection before your surgery. If you get an infection before surgery, you may receive antibiotics to treat it. °Plan to have a responsible adult take you home from the hospital or clinic. °If you will be going home right after the procedure, plan to have a responsible adult care for you for the time you are told. This is important. °What happens during the procedure? ° °An IV will be inserted into one of your veins. °You will be given one or more of the following: °A medicine to help you relax (sedative). °A medicine to numb the area (local anesthetic). °A medicine to make you fall asleep (general anesthetic). °Your surgeon will make an incision over the hernia. °The tissues of the hernia will be moved back into place. °The edges of the hernia may be stitched (sutured) together. °The opening in the abdominal muscles will be closed with stitches (sutures). Or, your surgeon will place a mesh patch made of artificial (synthetic) material over the opening. °The incision will be closed with sutures, skin glue, or adhesive strips. °A bandage (dressing) may be   placed over the incision. °The procedure may vary among health care providers and hospitals. °What happens after the procedure? °Your blood pressure, heart rate, breathing rate,  and blood oxygen level will be monitored until you leave the hospital or clinic. °You may be given medicine for pain. °If you were given a sedative during the procedure, it can affect you for several hours. Do not drive or operate machinery until your health care provider says that it is safe. °Summary °Open hernia repair is a surgical procedure to fix a hernia. Hernias commonly occur in the groin and around the belly button. °Emergency surgery may be needed if contents of the abdomen get stuck in the opening (incarcerated hernia) or if the blood supply gets cut off (strangulated hernia). °In this procedure, an incision is made in the abdomen to perform the surgery. °After the procedure, you may be given medicine for pain. °This information is not intended to replace advice given to you by your health care provider. Make sure you discuss any questions you have with your health care provider. °Document Revised: 07/02/2020 Document Reviewed: 07/02/2020 °[employer]vier Patient Education © 2022 [employer] ° °

## 2022-02-28 DIAGNOSIS — J449 Chronic obstructive pulmonary disease, unspecified: Secondary | ICD-10-CM | POA: Diagnosis not present

## 2022-02-28 DIAGNOSIS — I10 Essential (primary) hypertension: Secondary | ICD-10-CM

## 2022-02-28 NOTE — H&P (Signed)
[facility] History and Physical ? ?Reason for Referral: Right inguinal hernia  ?Referring Physician:  Self ? ?Chief Complaint   ?Follow-up ?  ? ? ?Michael Evans is a 81 y.o. male.  ?HPI: Mr .Macek is well known to me with bilateral inguinal hernias that had the left side repaired due to worsening pain and issues. But has had issues with the right side getting incarcerated and causing a SBO. He had this just a few weeks ago. He says that he wants to get the right side fixed. He did well with the left side repair and we had him admitted overnight to monitor given his age and chronic lung disease. He says that the right sided hernia is keeping him from doing work in his garden due to pain. He denies any obstructive symptoms.  ? ?Past Medical History:  ?Diagnosis Date  ? Arthritis   ? Chronic rhinitis   ? COPD (chronic obstructive pulmonary disease) ([facility])   ? PFT 06-26-09 FEV1  1.75( 71%), FVC 3.65( 101%), FEV1% 48, TLC 5.53(104%), DLCO 55%, no BD  ? Hypertension   ? Nasal septal deviation   ? Nocturia   ? On home O2   ? 2L N/C  ? OSA (obstructive sleep apnea) 04/21/2011  ? Skin cancer   ? ? ?Past Surgical History:  ?Procedure Laterality Date  ? APPENDECTOMY    ? BACK SURGERY    ? CATARACT EXTRACTION W/PHACO  01/06/2013  ? Procedure: CATARACT EXTRACTION PHACO AND INTRAOCULAR LENS PLACEMENT (IOC);  Surgeon: Kerry Hunt, MD;  Location: [facility];  Service: Ophthalmology;  Laterality: Right;  CDE: 22.17  ? INGUINAL HERNIA REPAIR Left 12/09/2021  ? Procedure: HERNIA REPAIR INGUINAL ADULT WITH MESH;  Surgeon: Reg Bircher C, MD;  Location: [facility];  Service: General;  Laterality: Left;  ? ? ?Family History  ?Problem Relation Age of Onset  ? Lung cancer Mother   ? ? ?Social History  ? ?Tobacco Use  ? Smoking status: Former  ?  Packs/day: 1.50  ?  Years: 54.00  ?  Pack years: 81.00  ?  Types: Cigarettes  ?  Quit date: 12/01/2001  ?  Years since quitting: 20.2  ? Smokeless tobacco: Never  ?Vaping Use  ? Vaping Use:  Never used  ?Substance Use Topics  ? Alcohol use: No  ? Drug use: No  ? ? ?Medications: I have reviewed the patient's current medications. ?Allergies as of 02/27/2022   ? ?   Reactions  ? Amlodipine Swelling  ? Swelling in feet  ? Aspirin Other (See Comments)  ? Upset stomach.  ? Other Hypertension  ? Hazelnuts  ? Penicillins Hives  ? Has patient had a PCN reaction causing immediate rash, facial/tongue/throat swelling, SOB or lightheadedness with hypotension: Yes ?Has patient had a PCN reaction causing severe rash involving mucus membranes or skin necrosis: No ?Has patient had a PCN reaction that required hospitalization No ?Has patient had a PCN reaction occurring within the last 10 years: No ?If all of the above answers are "NO", then may proceed with Cephalosporin use.  ? Sulfa Antibiotics Hives  ? ?  ? ?  ?Medication List  ?  ? ?  ? Accurate as of February 27, 2022  2:16 PM. If you have any questions, ask your nurse or doctor.  ?  ?  ? ?  ? ?albuterol 108 (90 Base) MCG/ACT inhaler ?Commonly known as: VENTOLIN HFA ?Inhale 1-2 puffs into the lungs every 6 (six) hours as   needed for wheezing or shortness of breath. ?  ?Breztri Aerosphere 160-9-4.8 MCG/ACT Aero ?Generic drug: Budeson-Glycopyrrol-Formoterol ?Inhale 2 puffs into the lungs in the morning and at bedtime. ?  ?cholecalciferol 25 MCG (1000 UNIT) tablet ?Commonly known as: VITAMIN D ?Take 3,000 Units by mouth daily. ?  ?cyanocobalamin 2000 MCG tablet ?Take 1,000 mcg by mouth daily. ?  ?dextromethorphan-guaiFENesin 30-600 MG 12hr tablet ?Commonly known as: MUCINEX DM ?Take 2 tablets by mouth 2 (two) times daily as needed for cough (sputum). ?  ?FISH OIL CONCENTRATE PO ?Take 1 capsule by mouth daily. ?  ?ipratropium-albuterol 0.5-2.5 (3) MG/3ML Soln ?Commonly known as: DUONEB ?Take by nebulization. ?  ?multivitamin tablet ?Take 1 tablet by mouth daily. Men's health ?  ?Nebulizer Misc ?1 each by Does not apply route daily as needed. PLEASE PROVIDE NEBULIZER KIT TO  INCLUDE TUBING/MASK/MOUTHPIECE ?  ?ondansetron 8 MG disintegrating tablet ?Commonly known as: Zofran ODT ?Take 1 tablet (8 mg total) by mouth every 8 (eight) hours as needed for nausea or vomiting. ?  ?oxyCODONE 5 MG immediate release tablet ?Commonly known as: Oxy IR/ROXICODONE ?Take 1 tablet (5 mg total) by mouth every 4 (four) hours as needed for severe pain or breakthrough pain. ?  ?OXYGEN ?Inhale 2 L into the lungs continuous. ?  ?pantoprazole 40 MG tablet ?Commonly known as: PROTONIX ?Take by mouth. ?  ?pyridoxine 100 MG tablet ?Commonly known as: B-6 ?Take 100 mg by mouth daily. ?  ?vitamin C 1000 MG tablet ?Take 1,000 mg by mouth daily. ?  ? ?  ? ? ? ?ROS:  ?A comprehensive review of systems was negative except for: Gastrointestinal: positive for right inguinal pain ? ?Blood pressure 137/67, pulse 62, temperature 97.9 ?F (36.6 ?C), temperature source Other (Comment), resp. rate 16, height 5' 5" (1.651 m), weight 136 lb (61.7 kg), SpO2 95 %. ?Physical Exam ?Vitals reviewed.  ?Constitutional:   ?   Appearance: Normal appearance.  ?HENT:  ?   Head: Atraumatic.  ?   Mouth/Throat:  ?   Mouth: Mucous membranes are moist.  ?Eyes:  ?   Extraocular Movements: Extraocular movements intact.  ?Cardiovascular:  ?   Rate and Rhythm: Normal rate and regular rhythm.  ?Pulmonary:  ?   Effort: Pulmonary effort is normal.  ?   Breath sounds: Normal breath sounds.  ?Abdominal:  ?   General: There is no distension.  ?   Palpations: Abdomen is soft.  ?   Tenderness: There is no abdominal tenderness.  ?   Hernia: A hernia is present. Hernia is present in the right inguinal area. There is no hernia in the left inguinal area.  ?   Comments: Right sided reducible, incision over left inguinal no recurrence   ?Musculoskeletal:     ?   General: Normal range of motion.  ?   Cervical back: Normal range of motion.  ?Skin: ?   General: Skin is warm.  ?Neurological:  ?   General: No focal deficit present.  ?   Mental Status: He is alert.   ?Psychiatric:     ?   Mood and Affect: Mood normal.  ? ? ?Results: ? ?Personally reviewed CT- right inguinal hernia with bowel, left side repaired  ? ?CLINICAL DATA:  Right groin pain. ?  ?EXAM: ?CT ABDOMEN AND PELVIS WITH CONTRAST ?  ?TECHNIQUE: ?Multidetector CT imaging of the abdomen and pelvis was performed ?using the standard protocol following bolus administration of ?intravenous contrast. ?  ?RADIATION DOSE REDUCTION: This exam was   performed according to the ?departmental dose-optimization program which includes automated ?exposure control, adjustment of the mA and/or kV according to ?patient size and/or use of iterative reconstruction technique. ?  ?CONTRAST:  157m OMNIPAQUE IOHEXOL 300 MG/ML  SOLN ?  ?COMPARISON:  CT scan 10/18/2021 ?  ?FINDINGS: ?Lower chest: Stable emphysematous changes and pulmonary scarring. ?The heart is normal in size. No pericardial effusion. ?  ?Hepatobiliary: No hepatic lesions or intrahepatic biliary ?dilatation. The gallbladder is unremarkable. No common bile duct ?dilatation. ?  ?Pancreas: No mass, inflammation or ductal dilatation. ?  ?Spleen: Normal size.  No focal lesions. ?  ?Adrenals/Urinary Tract: Adrenal glands and kidneys are unremarkable ?and stable. Stable left renal cyst. The bladder is unremarkable. ?  ?Stomach/Bowel: The stomach, duodenum, small bowel and colon are ?unremarkable. There is a right inguinal hernia containing a slightly ?dilated fluid-filled loop of small bowel. Both limbs are somewhat ?constricted in the hernia and there are slightly dilated loops of ?small bowel proximally. No surrounding inflammatory changes or fluid ?in the hernia sac. Very similar appearance on the prior CT scan. The ?left inguinal hernia seen on the prior CT scan has been fixed ?surgically. ?  ?Vascular/Lymphatic: Stable advanced atherosclerotic calcifications ?involving the aorta and branch vessels but no focal aneurysm or ?dissection. The major venous structures are patent.  No mesenteric or ?retroperitoneal mass or adenopathy. ?  ?Reproductive: Mildly enlarged prostate gland appears stable. The ?seminal vesicles are unremarkable. ?  ?Other: No pelvic mass or adenopathy. No fre

## 2022-03-13 NOTE — Patient Instructions (Signed)
? ? ? ? ? ? ? ? TIN ENGRAM ? 03/13/2022  ?  ? '@PREFPERIOPPHARMACY'$ @ ? ? Your procedure is scheduled on  03/19/2022. ? ? Report to [facility] at  0600  A.M. ? ? Call this number if you have problems the morning of surgery: ? [phone] ? ? Remember: ? Do not eat or drink after midnight. ?  ? ?Use your nebulizer and your inhaler before you come and bring your rescue inhaler with you. ?  ? Take these medicines the morning of surgery with A SIP OF WATER  ? ?None. ?  ? Do not wear jewelry, make-up or nail polish. ? Do not wear lotions, powders, or perfumes, or deodorant. ? Do not shave 48 hours prior to surgery.  Men may shave face and neck. ? Do not bring valuables to the hospital. ? [facility] is not responsible for any belongings or valuables. ? ?Contacts, dentures or bridgework may not be worn into surgery.  Leave your suitcase in the car.  After surgery it may be brought to your room. ? ?For patients admitted to the hospital, discharge time will be determined by your treatment team. ? ?Patients discharged the day of surgery will not be allowed to drive home and must have someone with them for 24 hours.  ? ? ?Special instructions:   DO NOT smoke tobacco or vape for 24 hours before your procedure. ? ?Please read over the following fact sheets that you were given. ?Coughing and Deep Breathing, Surgical Site Infection Prevention, Anesthesia Post-op Instructions, and Care and Recovery After Surgery ?  ? ? ? Open Hernia Repair, Adult, Care After ?What can I expect after the procedure? ?After the procedure, it is common to have: ?Mild discomfort. ?Slight bruising. ?Mild swelling. ?Pain in the belly (abdomen). ?A small amount of blood from the cut from surgery (incision). ?Follow these instructions at home: ?Your doctor may give you more specific instructions. If you have problems, call your doctor. ?Medicines ?Take over-the-counter and prescription medicines only as told by your doctor. ?If told, take steps to prevent  problems with pooping (constipation). You may need to: ?Drink enough fluid to keep your pee (urine) pale yellow. ?Take medicines. You will be told what medicines to take. ?Eat foods that are high in fiber. These include beans, whole grains, and fresh fruits and vegetables. ?Limit foods that are high in fat and sugar. These include fried or sweet foods. ?Ask your doctor if you should avoid driving or using machines while you are taking your medicine. ?Incision care ? ?Follow instructions from your doctor about how to take care of your incision. Make sure you: ?Wash your hands with soap and water for at least 20 seconds before and after you change your bandage (dressing). If you cannot use soap and water, use hand sanitizer. ?Change your bandage. ?Leave stitches or skin glue in place for at least 2 weeks. ?Leave tape strips alone unless you are told to take them off. You may trim the edges of the tape strips if they curl up. ?Check your incision every day for signs of infection. Check for: ?More redness, swelling, or pain. ?More fluid or blood. ?Warmth. ?Pus or a bad smell. ?Wear loose, soft clothing while your incision heals. ?Activity ? ?Rest as told by your doctor. ?Do not lift anything that is heavier than 10 lb (4.5 kg), or the limit that you are told. ?Do not play contact sports until your doctor says that this is safe. ?If  you were given a sedative during your procedure, do not drive or use machines until your doctor says that it is safe. A sedative is a medicine that helps you relax. ?Return to your normal activities when your doctor says that it is safe. ?General instructions ?Do not take baths, swim, or use a hot tub. Ask your doctor about taking showers or sponge baths. ?Hold a pillow over your belly when you cough or sneeze. This helps with pain. ?Do not smoke or use any products that contain nicotine or tobacco. If you need help quitting, ask your doctor. ?Keep all follow-up visits. ?Contact a doctor  if: ?You have any of these signs of infection in or around your incision: ?More redness, swelling, or pain. ?More fluid or blood. ?Warmth. ?Pus. ?A bad smell. ?You have a fever or chills. ?You have blood in your poop (stool). ?You have not pooped (had a bowel movement) in 2-3 days. ?Medicine does not help your pain. ?Get help right away if: ?You have chest pain, or you are short of breath. ?You feel faint or light-headed. ?You have very bad pain. ?You vomit and your pain is worse. ?You have pain, swelling, or redness in a leg. ?These symptoms may be an emergency. Get help right away. Call your local emergency services (911 in the [place] ?Do not wait to see if the symptoms will go away. ?Do not drive yourself to the hospital. ?Summary ?After this procedure, it is common to have mild discomfort, slight bruising, and mild swelling. ?Follow instructions from your doctor about how to take care of your cut from surgery (incision). Check every day for signs of infection. ?Do not lift heavy objects or play contact sports until your doctor says it is safe. ?Return to your normal activities as told by your doctor. ?This information is not intended to replace advice given to you by your health care provider. Make sure you discuss any questions you have with your health care provider. ?Document Revised: 07/02/2020 Document Reviewed: 07/02/2020 ?Elsevier Patient Education ? [place]. ?General Anesthesia, Adult, Care After ?This sheet gives you information about how to care for yourself after your procedure. Your health care provider may also give you more specific instructions. If you have problems or questions, contact your health care provider. ?What can I expect after the procedure? ?After the procedure, the following side effects are common: ?Pain or discomfort at the IV site. ?Nausea. ?Vomiting. ?Sore throat. ?Trouble concentrating. ?Feeling cold or chills. ?Feeling weak or tired. ?Sleepiness and fatigue. ?Soreness  and body aches. These side effects can affect parts of the body that were not involved in surgery. ?Follow these instructions at home: ?For the time period you were told by your health care provider: ? ?Rest. ?Do not participate in activities where you could fall or become injured. ?Do not drive or use machinery. ?Do not drink alcohol. ?Do not take sleeping pills or medicines that cause drowsiness. ?Do not make important decisions or sign legal documents. ?Do not take care of children on your own. ?Eating and drinking ?Follow any instructions from your health care provider about eating or drinking restrictions. ?When you feel hungry, start by eating small amounts of foods that are soft and easy to digest (bland), such as toast. Gradually return to your regular diet. ?Drink enough fluid to keep your urine pale yellow. ?If you vomit, rehydrate by drinking water, juice, or clear broth. ?General instructions ?If you have sleep apnea, surgery and certain medicines can increase your risk  for breathing problems. Follow instructions from your health care provider about wearing your sleep device: ?Anytime you are sleeping, including during daytime naps. ?While taking prescription pain medicines, sleeping medicines, or medicines that make you drowsy. ?Have a responsible adult stay with you for the time you are told. It is important to have someone help care for you until you are awake and alert. ?Return to your normal activities as told by your health care provider. Ask your health care provider what activities are safe for you. ?Take over-the-counter and prescription medicines only as told by your health care provider. ?If you smoke, do not smoke without supervision. ?Keep all follow-up visits as told by your health care provider. This is important. ?Contact a health care provider if: ?You have nausea or vomiting that does not get better with medicine. ?You cannot eat or drink without vomiting. ?You have pain that does not  get better with medicine. ?You are unable to pass urine. ?You develop a skin rash. ?You have a fever. ?You have redness around your IV site that gets worse. ?Get help right away if: ?You have difficulty b

## 2022-03-17 ENCOUNTER — Encounter (HOSPITAL_COMMUNITY)
Admission: RE | Admit: 2022-03-17 | Discharge: 2022-03-17 | Disposition: A | Discharge status: Home / Self Care | Payer: Medicare HMO | Source: Ambulatory Visit | Attending: General Surgery | Admitting: General Surgery

## 2022-03-17 ENCOUNTER — Encounter (HOSPITAL_COMMUNITY): Payer: Self-pay

## 2022-03-17 DIAGNOSIS — Z01818 Encounter for other preprocedural examination: Secondary | ICD-10-CM | POA: Diagnosis not present

## 2022-03-17 HISTORY — DX: Dyspnea, unspecified: R06.00

## 2022-03-19 ENCOUNTER — Ambulatory Visit (HOSPITAL_COMMUNITY): Payer: Medicare HMO | Admitting: Anesthesiology

## 2022-03-19 ENCOUNTER — Observation Stay (HOSPITAL_COMMUNITY)
Admission: RE | Admit: 2022-03-19 | Discharge: 2022-03-20 | Disposition: A | Discharge status: Home / Self Care | Payer: Medicare HMO | Attending: General Surgery | Admitting: General Surgery

## 2022-03-19 ENCOUNTER — Ambulatory Visit (HOSPITAL_BASED_OUTPATIENT_CLINIC_OR_DEPARTMENT_OTHER): Payer: Medicare HMO | Admitting: Anesthesiology

## 2022-03-19 ENCOUNTER — Other Ambulatory Visit: Payer: Self-pay

## 2022-03-19 ENCOUNTER — Encounter (HOSPITAL_COMMUNITY)
Admission: RE | Disposition: A | Discharge status: Home / Self Care | Payer: Self-pay | Source: Home / Self Care | Attending: General Surgery

## 2022-03-19 ENCOUNTER — Encounter (HOSPITAL_COMMUNITY): Payer: Self-pay | Admitting: General Surgery

## 2022-03-19 DIAGNOSIS — G4730 Sleep apnea, unspecified: Secondary | ICD-10-CM

## 2022-03-19 DIAGNOSIS — K4090 Unilateral inguinal hernia, without obstruction or gangrene, not specified as recurrent: Secondary | ICD-10-CM

## 2022-03-19 DIAGNOSIS — I10 Essential (primary) hypertension: Secondary | ICD-10-CM | POA: Insufficient documentation

## 2022-03-19 DIAGNOSIS — J449 Chronic obstructive pulmonary disease, unspecified: Secondary | ICD-10-CM | POA: Insufficient documentation

## 2022-03-19 DIAGNOSIS — J439 Emphysema, unspecified: Secondary | ICD-10-CM | POA: Diagnosis not present

## 2022-03-19 DIAGNOSIS — Z87891 Personal history of nicotine dependence: Secondary | ICD-10-CM | POA: Diagnosis not present

## 2022-03-19 DIAGNOSIS — Z85828 Personal history of other malignant neoplasm of skin: Secondary | ICD-10-CM | POA: Insufficient documentation

## 2022-03-19 DIAGNOSIS — J9611 Chronic respiratory failure with hypoxia: Secondary | ICD-10-CM | POA: Insufficient documentation

## 2022-03-19 DIAGNOSIS — K219 Gastro-esophageal reflux disease without esophagitis: Secondary | ICD-10-CM | POA: Diagnosis not present

## 2022-03-19 DIAGNOSIS — R001 Bradycardia, unspecified: Secondary | ICD-10-CM | POA: Insufficient documentation

## 2022-03-19 DIAGNOSIS — R058 Other specified cough: Secondary | ICD-10-CM | POA: Diagnosis not present

## 2022-03-19 DIAGNOSIS — K4190 Unilateral femoral hernia, without obstruction or gangrene, not specified as recurrent: Secondary | ICD-10-CM

## 2022-03-19 DIAGNOSIS — K4091 Unilateral inguinal hernia, without obstruction or gangrene, recurrent: Secondary | ICD-10-CM

## 2022-03-19 DIAGNOSIS — Z79899 Other long term (current) drug therapy: Secondary | ICD-10-CM | POA: Diagnosis not present

## 2022-03-19 HISTORY — PX: INGUINAL HERNIA REPAIR: SHX194

## 2022-03-19 LAB — BASIC METABOLIC PANEL
Anion gap: 7 (ref 5–15)
BUN: 22 mg/dL (ref 8–23)
CO2: 28 mmol/L (ref 22–32)
Calcium: 9.1 mg/dL (ref 8.9–10.3)
Chloride: 104 mmol/L (ref 98–111)
Creatinine, Ser: 0.87 mg/dL (ref 0.61–1.24)
GFR, Estimated: >60 mL/min (ref >60)
Glucose, Bld: 107 mg/dL — ABNORMAL HIGH (ref 70–99)
Potassium: 4.4 mmol/L (ref 3.5–5.1)
Sodium: 139 mmol/L (ref 135–145)

## 2022-03-19 LAB — MAGNESIUM: Magnesium: 2 mg/dL (ref 1.7–2.4)

## 2022-03-19 LAB — MRSA NEXT GEN BY PCR, NASAL: MRSA by PCR Next Gen: NOT DETECTED

## 2022-03-19 SURGERY — REPAIR, HERNIA, INGUINAL, ADULT
Anesthesia: General | Site: Inguinal | Laterality: Right

## 2022-03-19 MED ORDER — ACETAMINOPHEN 500 MG PO TABS
1000.0000 mg | ORAL_TABLET | Freq: Four times a day (QID) | ORAL | Status: DC
  Filled 2022-03-19: qty 2

## 2022-03-19 MED ORDER — LACTATED RINGERS IV SOLN: INTRAVENOUS | Status: DC

## 2022-03-19 MED ORDER — DIPHENHYDRAMINE HCL 12.5 MG/5ML PO ELIX: 12.5000 mg | ORAL_SOLUTION | Freq: Four times a day (QID) | ORAL | Status: DC | PRN

## 2022-03-19 MED ORDER — UMECLIDINIUM BROMIDE 62.5 MCG/ACT IN AEPB
1.0000 | INHALATION_SPRAY | Freq: Every day | RESPIRATORY_TRACT | Status: DC
  Administered 2022-03-20: 1 via RESPIRATORY_TRACT
  Filled 2022-03-19: qty 7

## 2022-03-19 MED ORDER — PANTOPRAZOLE SODIUM 40 MG IV SOLR
40.0000 mg | Freq: Every day | INTRAVENOUS | Status: DC
  Administered 2022-03-19: 40 mg via INTRAVENOUS
  Filled 2022-03-19: qty 10

## 2022-03-19 MED ORDER — MORPHINE SULFATE (PF) 2 MG/ML IV SOLN: 2.0000 mg | INTRAVENOUS | Status: DC | PRN

## 2022-03-19 MED ORDER — VITAMIN B-6 50 MG PO TABS
100.0000 mg | ORAL_TABLET | Freq: Every day | ORAL | Status: DC
  Administered 2022-03-19 – 2022-03-20 (×2): 100 mg via ORAL
  Filled 2022-03-19 (×2): qty 1
  Filled 2022-03-19: qty 2
  Filled 2022-03-19: qty 1

## 2022-03-19 MED ORDER — CHLORHEXIDINE GLUCONATE 0.12 % MT SOLN
15.0000 mL | Freq: Once | OROMUCOSAL | Status: AC
  Administered 2022-03-19: 15 mL via OROMUCOSAL

## 2022-03-19 MED ORDER — SODIUM CHLORIDE 0.9 % IR SOLN
Status: DC | PRN
Start: 2022-03-19 — End: 2022-03-19
  Administered 2022-03-19: 1000 mL

## 2022-03-19 MED ORDER — SIMETHICONE 80 MG PO CHEW: 40.0000 mg | CHEWABLE_TABLET | Freq: Four times a day (QID) | ORAL | Status: DC | PRN

## 2022-03-19 MED ORDER — VITAMIN B-12 1000 MCG PO TABS
1000.0000 ug | ORAL_TABLET | Freq: Every day | ORAL | Status: DC
  Administered 2022-03-19 – 2022-03-20 (×2): 1000 ug via ORAL
  Filled 2022-03-19 (×2): qty 1

## 2022-03-19 MED ORDER — DM-GUAIFENESIN ER 30-600 MG PO TB12: 2.0000 | ORAL_TABLET | Freq: Two times a day (BID) | ORAL | Status: DC | PRN

## 2022-03-19 MED ORDER — DIPHENHYDRAMINE HCL 50 MG/ML IJ SOLN: 12.5000 mg | Freq: Four times a day (QID) | INTRAMUSCULAR | Status: DC | PRN

## 2022-03-19 MED ORDER — ONDANSETRON HCL 4 MG/2ML IJ SOLN
INTRAMUSCULAR | Status: DC | PRN
  Administered 2022-03-19: 4 mg via INTRAVENOUS

## 2022-03-19 MED ORDER — BUDESON-GLYCOPYRROL-FORMOTEROL 160-9-4.8 MCG/ACT IN AERO: 2.0000 | INHALATION_SPRAY | Freq: Two times a day (BID) | RESPIRATORY_TRACT | Status: DC

## 2022-03-19 MED ORDER — ORAL CARE MOUTH RINSE
15.0000 mL | Freq: Two times a day (BID) | OROMUCOSAL | Status: DC
  Administered 2022-03-19 (×2): 15 mL via OROMUCOSAL

## 2022-03-19 MED ORDER — MOMETASONE FURO-FORMOTEROL FUM 200-5 MCG/ACT IN AERO
2.0000 | INHALATION_SPRAY | Freq: Two times a day (BID) | RESPIRATORY_TRACT | Status: DC
  Administered 2022-03-19 – 2022-03-20 (×2): 2 via RESPIRATORY_TRACT

## 2022-03-19 MED ORDER — ORAL CARE MOUTH RINSE: 15.0000 mL | Freq: Once | OROMUCOSAL | Status: AC

## 2022-03-19 MED ORDER — PROPOFOL 10 MG/ML IV BOLUS
INTRAVENOUS | Status: DC | PRN
  Administered 2022-03-19: 100 mg via INTRAVENOUS

## 2022-03-19 MED ORDER — PROPOFOL 10 MG/ML IV BOLUS
INTRAVENOUS | Status: AC
  Filled 2022-03-19: qty 20

## 2022-03-19 MED ORDER — GUAIFENESIN ER 600 MG PO TB12
1200.0000 mg | ORAL_TABLET | Freq: Two times a day (BID) | ORAL | Status: DC
  Administered 2022-03-19 (×2): 1200 mg via ORAL
  Filled 2022-03-19 (×3): qty 2

## 2022-03-19 MED ORDER — ALBUTEROL SULFATE HFA 108 (90 BASE) MCG/ACT IN AERS: 1.0000 | INHALATION_SPRAY | Freq: Four times a day (QID) | RESPIRATORY_TRACT | Status: DC | PRN

## 2022-03-19 MED ORDER — METOPROLOL TARTRATE 5 MG/5ML IV SOLN: 5.0000 mg | Freq: Four times a day (QID) | INTRAVENOUS | Status: DC | PRN

## 2022-03-19 MED ORDER — ASCORBIC ACID 500 MG PO TABS
1000.0000 mg | ORAL_TABLET | Freq: Every day | ORAL | Status: DC
Start: 2022-03-19 — End: 2022-03-20
  Administered 2022-03-19 – 2022-03-20 (×2): 1000 mg via ORAL
  Filled 2022-03-19 (×3): qty 2

## 2022-03-19 MED ORDER — GLYCOPYRROLATE 0.2 MG/ML IJ SOLN
INTRAMUSCULAR | Status: DC | PRN
  Administered 2022-03-19: .2 mg via INTRAVENOUS

## 2022-03-19 MED ORDER — DOCUSATE SODIUM 100 MG PO CAPS
100.0000 mg | ORAL_CAPSULE | Freq: Two times a day (BID) | ORAL | Status: DC
  Administered 2022-03-19 – 2022-03-20 (×3): 100 mg via ORAL
  Filled 2022-03-19 (×3): qty 1

## 2022-03-19 MED ORDER — ONDANSETRON 4 MG PO TBDP: 4.0000 mg | ORAL_TABLET | Freq: Four times a day (QID) | ORAL | Status: DC | PRN

## 2022-03-19 MED ORDER — CHLORHEXIDINE GLUCONATE CLOTH 2 % EX PADS: 6.0000 | MEDICATED_PAD | Freq: Once | CUTANEOUS | Status: DC

## 2022-03-19 MED ORDER — LIDOCAINE HCL (CARDIAC) PF 50 MG/5ML IV SOSY
PREFILLED_SYRINGE | INTRAVENOUS | Status: DC | PRN
  Administered 2022-03-19: 80 mg via INTRAVENOUS

## 2022-03-19 MED ORDER — VITAMIN D 25 MCG (1000 UNIT) PO TABS
3000.0000 [IU] | ORAL_TABLET | Freq: Every day | ORAL | Status: DC
  Administered 2022-03-19 – 2022-03-20 (×2): 3000 [IU] via ORAL
  Filled 2022-03-19 (×2): qty 3

## 2022-03-19 MED ORDER — NEBULIZER MISC: 1.0000 | Freq: Every day | Status: DC | PRN

## 2022-03-19 MED ORDER — VANCOMYCIN HCL IN DEXTROSE 1-5 GM/200ML-% IV SOLN: 1000.0000 mg | INTRAVENOUS | Status: AC

## 2022-03-19 MED ORDER — VANCOMYCIN HCL IN DEXTROSE 1-5 GM/200ML-% IV SOLN
INTRAVENOUS | Status: AC
  Administered 2022-03-19: 1000 mg via INTRAVENOUS
  Filled 2022-03-19: qty 200

## 2022-03-19 MED ORDER — OMEGA-3-ACID ETHYL ESTERS 1 G PO CAPS
1.0000 g | ORAL_CAPSULE | Freq: Every day | ORAL | Status: DC
  Administered 2022-03-19 – 2022-03-20 (×2): 1 g via ORAL
  Filled 2022-03-19 (×2): qty 1

## 2022-03-19 MED ORDER — CHLORHEXIDINE GLUCONATE CLOTH 2 % EX PADS
6.0000 | MEDICATED_PAD | Freq: Every day | CUTANEOUS | Status: DC
  Administered 2022-03-20: 6 via TOPICAL

## 2022-03-19 MED ORDER — OXYCODONE HCL 5 MG PO TABS: 5.0000 mg | ORAL_TABLET | ORAL | Status: DC | PRN

## 2022-03-19 MED ORDER — FENTANYL CITRATE (PF) 100 MCG/2ML IJ SOLN
INTRAMUSCULAR | Status: DC | PRN
  Administered 2022-03-19: 50 ug via INTRAVENOUS
  Administered 2022-03-19 (×4): 25 ug via INTRAVENOUS

## 2022-03-19 MED ORDER — BUPIVACAINE HCL (300 MG DOSE) 3 X 100 MG IL IMPL
DRUG_IMPLANT | Status: AC
  Filled 2022-03-19: qty 100

## 2022-03-19 MED ORDER — BUPIVACAINE HCL (300 MG DOSE) 3 X 100 MG IL IMPL
DRUG_IMPLANT | Status: DC | PRN
  Administered 2022-03-19: 300 mg

## 2022-03-19 MED ORDER — ONDANSETRON HCL 4 MG/2ML IJ SOLN: 4.0000 mg | Freq: Four times a day (QID) | INTRAMUSCULAR | Status: DC | PRN

## 2022-03-19 MED ORDER — FENTANYL CITRATE (PF) 250 MCG/5ML IJ SOLN
INTRAMUSCULAR | Status: AC
  Filled 2022-03-19: qty 5

## 2022-03-19 MED ORDER — FENTANYL CITRATE PF 50 MCG/ML IJ SOSY: 25.0000 ug | PREFILLED_SYRINGE | INTRAMUSCULAR | Status: DC | PRN

## 2022-03-19 MED ORDER — ADULT MULTIVITAMIN W/MINERALS CH
1.0000 | ORAL_TABLET | Freq: Every day | ORAL | Status: DC
  Administered 2022-03-19 – 2022-03-20 (×2): 1 via ORAL
  Filled 2022-03-19 (×2): qty 1

## 2022-03-19 SURGICAL SUPPLY — 40 items
ADH SKN CLS APL DERMABOND .7 (GAUZE/BANDAGES/DRESSINGS) ×1
CLOTH BEACON ORANGE TIMEOUT ST (SAFETY) ×2 IMPLANT
COVER LIGHT HANDLE STERIS (MISCELLANEOUS) ×4 IMPLANT
DERMABOND ADVANCED (GAUZE/BANDAGES/DRESSINGS) ×1
DERMABOND ADVANCED .7 DNX12 (GAUZE/BANDAGES/DRESSINGS) ×1 IMPLANT
DRAIN PENROSE 0.5X18 (DRAIN) ×2 IMPLANT
ELECT REM PT RETURN 9FT ADLT (ELECTROSURGICAL) ×2
ELECTRODE REM PT RTRN 9FT ADLT (ELECTROSURGICAL) ×1 IMPLANT
GAUZE 4X4 16PLY ~~LOC~~+RFID DBL (SPONGE) ×1 IMPLANT
GAUZE SPONGE 4X4 12PLY STRL (GAUZE/BANDAGES/DRESSINGS) ×2 IMPLANT
GLOVE BIO SURGEON STRL SZ 6.5 (GLOVE) ×2 IMPLANT
GLOVE BIO SURGEON STRL SZ7 (GLOVE) ×2 IMPLANT
GLOVE BIOGEL PI IND STRL 6.5 (GLOVE) ×1 IMPLANT
GLOVE BIOGEL PI IND STRL 7.0 (GLOVE) ×2 IMPLANT
GLOVE BIOGEL PI INDICATOR 6.5 (GLOVE) ×1
GLOVE BIOGEL PI INDICATOR 7.0 (GLOVE) ×4
GOWN STRL REUS W/TWL LRG LVL3 (GOWN DISPOSABLE) ×6 IMPLANT
INST SET MINOR GENERAL (KITS) ×2 IMPLANT
KIT TURNOVER KIT A (KITS) ×2 IMPLANT
MANIFOLD NEPTUNE II (INSTRUMENTS) ×2 IMPLANT
MESH HERNIA 1.6X1.9 PLUG LRG (Mesh General) IMPLANT
MESH HERNIA PLUG LRG (Mesh General) ×1 IMPLANT
NDL HYPO 18GX1.5 BLUNT FILL (NEEDLE) ×1 IMPLANT
NDL HYPO 21X1.5 SAFETY (NEEDLE) ×1 IMPLANT
NEEDLE HYPO 18GX1.5 BLUNT FILL (NEEDLE) ×2 IMPLANT
NEEDLE HYPO 21X1.5 SAFETY (NEEDLE) ×2 IMPLANT
NS IRRIG 1000ML POUR BTL (IV SOLUTION) ×2 IMPLANT
PACK MINOR (CUSTOM PROCEDURE TRAY) ×2 IMPLANT
PAD ARMBOARD 7.5X6 YLW CONV (MISCELLANEOUS) ×2 IMPLANT
SET BASIN LINEN APH (SET/KITS/TRAYS/PACK) ×2 IMPLANT
SOL PREP PROV IODINE SCRUB 4OZ (MISCELLANEOUS) ×2 IMPLANT
SUT MNCRL AB 4-0 PS2 18 (SUTURE) ×2 IMPLANT
SUT NOVA NAB GS-22 2 2-0 T-19 (SUTURE) ×4 IMPLANT
SUT VIC AB 2-0 CT1 27 (SUTURE) ×2
SUT VIC AB 2-0 CT1 TAPERPNT 27 (SUTURE) ×1 IMPLANT
SUT VIC AB 3-0 SH 27 (SUTURE) ×2
SUT VIC AB 3-0 SH 27X BRD (SUTURE) ×1 IMPLANT
SUT VICRYL AB 3 0 TIES (SUTURE) ×1 IMPLANT
SYR 20ML LL LF (SYRINGE) ×2 IMPLANT
SYR 30ML LL (SYRINGE) ×2 IMPLANT

## 2022-03-19 NOTE — Anesthesia Preprocedure Evaluation (Signed)
Anesthesia Evaluation  ?Patient identified by MRN, date of birth, ID band ?Patient awake ? ? ? ?Reviewed: ?Allergy & Precautions, H&P , NPO status , Patient's Chart, lab work & pertinent test results, reviewed documented beta blocker date and time  ? ?Airway ?Mallampati: II ? ?TM Distance: >3 FB ?Neck ROM: full ? ? ? Dental ?no notable dental hx. ? ?  ?Pulmonary ?shortness of breath and Long-Term Oxygen Therapy, sleep apnea , COPD,  oxygen dependent, former smoker,  ?  ?Pulmonary exam normal ?breath sounds clear to auscultation ? ? ? ? ? ? Cardiovascular ?Exercise Tolerance: Good ?hypertension, negative cardio ROS ? ? ?Rhythm:regular Rate:Normal ? ? ?  ?Neuro/Psych ?negative neurological ROS ? negative psych ROS  ? GI/Hepatic ?negative GI ROS, Neg liver ROS,   ?Endo/Other  ?negative endocrine ROS ? Renal/GU ?negative Renal ROS  ?negative genitourinary ?  ?Musculoskeletal ? ? Abdominal ?  ?Peds ? Hematology ?negative hematology ROS ?(+)   ?Anesthesia Other Findings ? ? Reproductive/Obstetrics ?negative OB ROS ? ?  ? ? ? ? ? ? ? ? ? ? ? ? ? ?  ?  ? ? ? ? ? ? ? ? ?Anesthesia Physical ?Anesthesia Plan ? ?ASA: 3 ? ?Anesthesia Plan: General and General LMA  ? ?Post-op Pain Management:   ? ?Induction:  ? ?PONV Risk Score and Plan: Ondansetron ? ?Airway Management Planned:  ? ?Additional Equipment:  ? ?Intra-op Plan:  ? ?Post-operative Plan:  ? ?Informed Consent: I have reviewed the patients History and Physical, chart, labs and discussed the procedure including the risks, benefits and alternatives for the proposed anesthesia with the patient or authorized representative who has indicated his/her understanding and acceptance.  ? ? ? ?Dental Advisory Given ? ?Plan Discussed with: CRNA ? ?Anesthesia Plan Comments:   ? ? ? ? ? ? ?Anesthesia Quick Evaluation ? ?

## 2022-03-19 NOTE — Op Note (Signed)
[facility] ?Operative Note ? ?03/19/22 ? ?Preoperative Diagnosis: Right inguinal hernia  ?  ?Postoperative Diagnosis: Same ?  ?Procedure(s) Performed: Right inguinal hernia repair with mesh ?  ?Surgeon: Lanell Matar. Constance Haw, MD ?  ?Assistants: No qualified resident was available ?  ?Anesthesia: General endotracheal ?  ?Anesthesiologist: Louann Sjogren, MD  ?  ?Specimens: None ?  ?Estimated Blood Loss: Minimal ?  ?Blood Replacement: None  ?  ?Complications: None  ? ?Wound Class: Clean ?  ?Operative Indications: Michael Evans is a 29 with a right inguinal hernia that had been incarcerated in the past. He has a history of chronic lung disease, and we discussed admission post operatively and risk of bleeding, infection, recurrence, nerve injury, cord injury, use of mesh. ? ?Findings: Direct inguinal hernia  ?  ?Procedure: The patient was taken to the operating room and placed supine. General endotracheal anesthesia was induced. Intravenous antibiotics were administered per protocol.  A time out was preformed verifying the correct patient, procedure, site, positioning and implants.  The right groin and scrotum were prepared and draped in the usual sterile fashion.  ? ?An incision was made in a natural skin crease between the pubic tubercle and the anterior superior iliac spine.  The incision was deepened with electrocautery through Scarpa's and Camper's fascia until the aponeurosis of the external oblique was encountered.  This was cleaned and the external ring was exposed.  An incision was made in the midportion of the external oblique aponeurosis in the direction of its fibers. The ilioinguinal nerve was identified and was protected throughout the dissection.  Flaps of the external oblique were developed cephalad and inferiorly.   ? ?The cord was identified and it was gently dissented free at the pubic tubercle and encircled with a Penrose drain.  Attention was then directed at the anteromedial aspect of  the cord, where an indirect hernia sac was not identified.  A direct hernia was noted.  The vas and testicular vessels were identified and protected from harm.  The Penrose was placed around the cord which was retracted inferiorly out of the field of view.  The direct hernia was scored and reduced into the internal ring without difficulty. Preperitoneal fat was noted but no entry into the hernia sac was noted.  A Large Perfix Plug was placed into the direct defect and filled the space and was secured with 2-0 Novafil to the conjoint tendon edge and shelving edge to prevent it from moving. The Perfix Mesh Patch was sutured to the inguinal ligament inferiorly starting at the pubic tubercle using 2-0 Novafil interrupted sutures.  The mesh was sutured superiorly to the conjoint tendon using 2-0 Novafil interrupted sutures.  Care was taken to ensure the mesh was placed in a relaxed fashion to avoid excessive tension and no neurovascular structures were caught in the repair.  Laterally the tails of the mesh were crossed and the internal ring was recreated, allowing for passage of cords without tension. Michael Evans was layered throughout the repair.  ? ?Hemostasis was adequate.  The Penrose was removed.  The external oblique aponeurosis was closed with a 2-0 Vicryl suture in a running fashion, taking care to not catch the ilioinguinal nerve in the suture line.  Scarpa's fashion was closed with 3-0 Vicryl interrupted sutures. The skin was closed with a subcuticular 4-0 Monocryl suture.  Dermabond was applied.  ? ?The testis was gently pulled down into its anatomic position in the scrotum. ? ?The patient tolerated the procedure well  and was taken to the PACU in stable condition. All counts were correct at the end of the case.  ?   ? ?  ?Curlene Labrum, MD ?[facility] ?[place], [place] [address] ?[phone] (office) ? ? ? ?

## 2022-03-19 NOTE — Anesthesia Postprocedure Evaluation (Signed)
Anesthesia Post Note ? ?Patient: Michael Evans ? ?Procedure(s) Performed: HERNIA REPAIR INGUINAL ADULT WITH MESH (Right: Inguinal) ? ?Patient location during evaluation: Phase II ?Anesthesia Type: General ?Level of consciousness: awake ?Pain management: pain level controlled ?Vital Signs Assessment: post-procedure vital signs reviewed and stable ?Respiratory status: spontaneous breathing and respiratory function stable ?Cardiovascular status: blood pressure returned to baseline and stable ?Postop Assessment: no headache and no apparent nausea or vomiting ?Anesthetic complications: no ?Comments: Late entry ? ? ?No notable events documented. ? ? ?Last Vitals:  ?Vitals:  ? 03/19/22 1400 03/19/22 1430  ?BP: (!) 117/57 (!) 112/46  ?Pulse: (!) 53 (!) 46  ?Resp: 17 15  ?Temp:    ?SpO2: 100% 100%  ?  ?Last Pain:  ?Vitals:  ? 03/19/22 1200  ?TempSrc: Oral  ?PainSc:   ? ? ?  ?  ?  ?  ?  ?  ? ?Louann Sjogren ? ? ? ? ?

## 2022-03-19 NOTE — Assessment & Plan Note (Signed)
-  Good saturation on chronic supplementation; no wheezing on exam.    ?- intermittent coughing spells. ?-Continue as needed bronchodilators and BEZTRI equivalent and the use of antitussive/mucolytic medications ?-continue . ?-Flutter valve and incentive spirometer ?Quit smoking 2003 ?

## 2022-03-19 NOTE — Transfer of Care (Signed)
Immediate Anesthesia Transfer of Care Note ? ?Patient: Michael Evans ? ?Procedure(s) Performed: HERNIA REPAIR INGUINAL ADULT WITH MESH (Right: Inguinal) ? ?Patient Location: PACU ? ?Anesthesia Type:General ? ?Level of Consciousness: awake ? ?Airway & Oxygen Therapy: Patient Spontanous Breathing and Patient connected to nasal cannula oxygen ? ?Post-op Assessment: Report given to RN ? ?Post vital signs: Reviewed and stable ? ?Last Vitals:  ?Vitals Value Taken Time  ?BP 149/119 03/19/22 0857  ?Temp    ?Pulse 63 03/19/22 0857  ?Resp 8 03/19/22 0857  ?SpO2 100 % 03/19/22 0857  ?Vitals shown include unvalidated device data. ? ?Last Pain:  ?Vitals:  ? 03/19/22 0641  ?TempSrc: Oral  ?PainSc: 0-No pain  ?   ? ?Patients Stated Pain Goal: 5 (03/19/22 3276) ? ?Complications: No notable events documented. ?

## 2022-03-19 NOTE — Assessment & Plan Note (Addendum)
-  Chronically using 2 L ?-Saturation stable with chronic supplementation at this time. ?-Continue as needed bronchodilators. ?-encouraged IS and flutter valve ?Remained stable on 2L through hospitalization ?

## 2022-03-19 NOTE — Progress Notes (Signed)
[facility] ? ?Updated his sister, Tamela Oddi, [phone]. Will go upstairs and be monitored in Stepdown overnight. ? ?Will get the hospitalist involved given his lung history.  ?Home meds ordered. ?Diet. ?Hopefully home tomorrow. ?Respiratory orders in and 2L ordered ? ?Curlene Labrum, MD ?[facility] ?[place], [place] [address] ?[phone] (office) ? ? ? ?

## 2022-03-19 NOTE — Assessment & Plan Note (Signed)
-  Status post surgical repair with mesh. ?-Overall procedure well tolerated ?-judicious opioids ?-post-op care per Dr. Constance Haw ?

## 2022-03-19 NOTE — Consult Note (Signed)
Initial Consultation Note ? ? ?Patient: Michael Evans OIB:[id number] DOB: 05-Dec-1940 PCP: [name], MD ?DOA: 03/19/2022 ?DOS: the patient was seen and examined on 03/19/2022 ?Primary service: [name], MD ? ?Referring physician: Dr. Constance Haw ?Reason for consult: medical managment ? ?Assessment and Plan: ?* Right inguinal hernia ?-Status post surgical repair with mesh. ?-Overall procedure well tolerated ?-judicious opioids ?-post-op care per Dr. Constance Haw ? ?COPD GOLD 2 ?-Good saturation on chronic supplementation; no wheezing on exam.    ?- intermittent coughing spells. ?-Continue as needed bronchodilators and BEZTRI equivalent and the use of antitussive/mucolytic medications ?-continue . ?-Flutter valve and incentive spirometer ?Quit smoking 2003 ? ?GERD (gastroesophageal reflux disease) ?Continue PPI ? ?Upper airway cough syndrome ?-Chronic and intermittent ?-Continue as needed Mucinex and antitussive regimen. ?-Incentive spirometer and flutter valve recommended. ? ?Chronic respiratory failure with hypoxia ([place]) ?-Chronically using 2 L ?-Saturation stable with chronic supplementation at this time. ?-Continue as needed bronchodilators. ? ?Essential hypertension ?Not on any meds outpt ?Diet controlled ?Monitor clinically ? ? ? ? ? ? ?[facility] will continue to follow the patient. ? ?HPI: Michael Evans is a 81 y/o male with  chronic respiratory failure with hypoxia on 2L [place], COPD, gastroesophageal reflux disease, hypertension, history of chronic rhinitis, obstructive sleep apnea (wearing oxygen at nighttime); who presented to the hospital for elective inguinal hernia repair.  Post-op, [facility] was consulted for medical management due to his COPD and respiratory failure.   ?Patient denies fevers, chills, headache, chest pain, dyspnea, nausea, vomiting, diarrhea, abdominal pain, dysuria, hematuria, hematochezia, and melena. ?In the PACE, patient was afebrile and hemodynamically stable with oxygen saturation 100% on  2L.  He was awake and alert and followed commands.    ? ?Review of Systems: As mentioned in the history of present illness. All other systems reviewed and are negative. ?Past Medical History:  ?Diagnosis Date  ? Arthritis   ? Chronic rhinitis   ? COPD (chronic obstructive pulmonary disease) ([place])   ? PFT 06-26-09 FEV1  1.75( 71%), FVC 3.65( 101%), FEV1% 48, TLC 5.53(104%), DLCO 55%, no BD  ? Dyspnea   ? Hypertension   ? Nasal septal deviation   ? Nocturia   ? On home O2   ? 2L N/[place]  ? OSA (obstructive sleep apnea) 04/21/2011  ? Skin cancer   ? ?Past Surgical History:  ?Procedure Laterality Date  ? APPENDECTOMY    ? BACK SURGERY    ? CATARACT EXTRACTION W/PHACO  01/06/2013  ? Procedure: CATARACT EXTRACTION PHACO AND INTRAOCULAR LENS PLACEMENT (IOC);  Surgeon: Tonny Branch, MD;  Location: [facility];  Service: Ophthalmology;  Laterality: Right;  CDE: 22.17  ? INGUINAL HERNIA REPAIR Left 12/09/2021  ? Procedure: HERNIA REPAIR INGUINAL ADULT WITH MESH;  Surgeon: [name], MD;  Location: [facility];  Service: General;  Laterality: Left;  ? ?Social History:  reports that he quit smoking about 20 years ago. His smoking use included cigarettes. He has a 81.00 pack-year smoking history. He has never used smokeless tobacco. He reports that he does not drink alcohol and does not use drugs. ? ?Allergies  ?Allergen Reactions  ? Benzyl Alcohol Itching  ? Amlodipine Swelling  ?  Swelling in feet  ? Aspirin Other (See Comments)  ?  Upset stomach.  ? Other Hypertension  ?  Hazelnuts  ? Penicillins Hives  ?  Has patient had a PCN reaction causing immediate rash, facial/tongue/throat swelling, SOB or lightheadedness with hypotension: Yes ?Has patient had a PCN  reaction causing severe rash involving mucus membranes or skin necrosis: No ?Has patient had a PCN reaction that required hospitalization No ?Has patient had a PCN reaction occurring within the last 10 years: No ?If all of the above answers are "NO", then may proceed with  Cephalosporin use. ?  ? Sulfa Antibiotics Hives  ? ? ?Family History  ?Problem Relation Age of Onset  ? Lung cancer Mother   ? ? ?Prior to Admission medications   ?Medication Sig Start Date End Date Taking? Authorizing Provider  ?albuterol (VENTOLIN HFA) 108 (90 Base) MCG/ACT inhaler Inhale 1-2 puffs into the lungs every 6 (six) hours as needed for wheezing or shortness of breath. 01/01/21  Yes [name], MD  ?Ascorbic Acid (VITAMIN C) 1000 MG tablet Take 1,000 mg by mouth daily.   Yes [provider]  ?Budeson-Glycopyrrol-Formoterol (BREZTRI AEROSPHERE) 160-9-4.8 MCG/ACT AERO Inhale 2 puffs into the lungs in the morning and at bedtime.   Yes [provider]  ?cholecalciferol (VITAMIN D) 25 MCG (1000 UNIT) tablet Take 3,000 Units by mouth daily.   Yes [provider]  ?cyanocobalamin 2000 MCG tablet Take 1,000 mcg by mouth daily.   Yes [provider]  ?dextromethorphan-guaiFENesin (MUCINEX DM) 30-600 MG 12hr tablet Take 2 tablets by mouth 2 (two) times daily as needed for cough (sputum). 12/10/21  Yes [name], MD  ?mometasone-formoterol [facility]) 200-5 MCG/ACT AERO Inhale 2 puffs into the lungs 2 (two) times daily.   Yes [provider]  ?Multiple Vitamin (MULTIVITAMIN) tablet Take 1 tablet by mouth daily. Men's health   Yes [provider]  ?Nebulizer MISC 1 each by Does not apply route daily as needed. PLEASE PROVIDE NEBULIZER KIT TO INCLUDE TUBING/MASK/MOUTHPIECE 10/04/21  Yes [name], MD  ?Omega-3 Fatty Acids (FISH OIL CONCENTRATE PO) Take 1,000 mg by mouth daily.   Yes [provider]  ?OXYGEN Inhale 2 L into the lungs continuous.   Yes [provider]  ?pyridoxine (B-6) 100 MG tablet Take 100 mg by mouth daily.   Yes [provider]  ? ? ?Physical Exam: ?Vitals:  ? 03/19/22 1030 03/19/22 1045 03/19/22 1100 03/19/22 1115  ?BP: (!) 145/73 (!) 144/72 (!) 163/71 (!) 144/76  ?Pulse: (!) 43 (!) 43 (!) 51 (!) 43   ?Resp: _0 ?Temp:      ?TempSrc:      ?SpO2: 98% 97% 100% 100%  ? ?GENERAL:  A&O x 3, NAD, well developed, cooperative, follows commands ?HEENT: [place]/AT, No thrush, No icterus, No oral ulcers ?Neck:  No neck mass, No meningismus, soft, supple ?CV: RRR, no S3, no S4, no rub, no JVD ?Lungs:  bibasilar rales.  No wheeze ?Abd: soft/NT +BS, nondistended ?Ext: No edema, no lymphangitis, no cyanosis, no rashes ?Neuro:  CN II-XII intact, strength 4/5 in RUE, RLE, strength 4/5 LUE, LLE; sensation intact bilateral; no dysmetria; babinski equivocal ? ?Data Reviewed:  ?Ordering BMP now.  No other recent labs ? ?Family Communication: none ?Primary team communication: Dr. Constance Haw ?Thank you very much for involving [place] in the care of your patient. ? ?Author: ?Orson Eva, MD ?03/19/2022 11:41 AM ? ?For on call review [email or web address].  ?

## 2022-03-19 NOTE — Assessment & Plan Note (Signed)
Not on any meds outpt ?Diet controlled ?Monitor clinically ?

## 2022-03-19 NOTE — Interval H&P Note (Signed)
History and Physical Interval Note: ? ?03/19/2022 ?7:25 AM ? ?Michael Evans  has presented today for surgery, with the diagnosis of RIGHT INGUINAL HERNIA.  The various methods of treatment have been discussed with the patient and family. After consideration of risks, benefits and other options for treatment, the patient has consented to  Procedure(s): ?HERNIA REPAIR INGUINAL ADULT WITH MESH (Right) as a surgical intervention.  The patient's history has been reviewed, patient examined, no change in status, stable for surgery.  I have reviewed the patient's chart and labs.  Questions were answered to the patient's satisfaction.   ?Marked. Staying overnight.  ? ?[name] ? ? ?

## 2022-03-19 NOTE — Assessment & Plan Note (Signed)
Continue PPI ?

## 2022-03-19 NOTE — Assessment & Plan Note (Signed)
-  Chronic and intermittent ?-Continue as needed Mucinex and antitussive regimen. ?-Incentive spirometer and flutter valve recommended. ?

## 2022-03-19 NOTE — Discharge Instructions (Addendum)
Discharge Instructions Hernia: ? ?Common Complaints: ?Pain at the incision site is common. This will improve with time. Take your pain medications as described below. ?Some nausea is common and poor appetite. The main goal is to stay hydrated the first few days after surgery.  ?Numbness at the incision or the thigh is common.  ?If you start to have burning or tingling pain in your groin, this is from a nerve being pinched. Please call and we can prescribe you a different type of pain medication for nerve pain.  ? ?Diet/ Activity: ?Diet as tolerated. You may not have an appetite, but it is important to stay hydrated. Drink 64 ounces of water a day. Your appetite will return with time.  ?Shower per your regular routine daily.  Do not take hot showers. Take warm showers that are less than 10 minutes. ?Rest and listen to your body, but do not remain in bed all day.  ?Walk everyday for at least 15-20 minutes. Deep cough and move around every 1-2 hours in the first few days after surgery.  ?Do not pick at the dermabond glue on your incision sites.  This glue film will remain in place for 1-2 weeks and will start to peel off.  ?Do not place lotions or balms on your incision unless instructed to specifically by Dr. Constance Haw.  ?Do not lift > 10 lbs, perform excessive bending, pushing, pulling, squatting for 6-8 weeks after surgery.  ? ?Pain Expectations and Narcotics: ?-After surgery you will have pain associated with your incisions and this is normal. The pain is muscular and nerve pain, and will get better with time. ?-You are encouraged and expected to take non narcotic medications like tylenol and ibuprofen (when able) to treat pain as multiple modalities can aid with pain treatment. ?-Narcotics are only used when pain is severe or there is breakthrough pain. ?-You are not expected to have a pain score of 0 after surgery, as we cannot prevent pain. A pain score of 3-4 that allows you to be functional, move, walk, and  tolerate some activity is the goal. The pain will continue to improve over the days after surgery and is dependent on your surgery. ?-Due to [place] law, we are only able to give a certain amount of pain medication to treat post operative pain, and we only give additional narcotics on a patient by patient basis.  ?-For most laparoscopic surgery, studies have shown that the majority of patients only need 10-15 narcotic pills, and for open surgeries most patients only need 15-20.   ?-Having appropriate expectations of pain and knowledge of pain management with non narcotics is important as we do not want anyone to become addicted to narcotic pain medication.  ?-Using ice packs in the first 48 hours and heating pads after 48 hours, wearing an abdominal binder (when recommended), and using over the counter medications are all ways to help with pain management.   ?-Simple acts like meditation and mindfulness practices after surgery can also help with pain control and research has proven the benefit of these practices. ? ?Medication: ?Take tylenol and ibuprofen as needed for pain control, alternating every 4-6 hours.  ?Example:  ?Tylenol '1000mg'$  @ 6am, 12noon, 6pm, 5mdnight (Do not exceed '4000mg'$  of tylenol a day). Ibuprofen '800mg'$  @ 9am, 3pm, 9pm, 3am (Do not exceed '3600mg'$  of ibuprofen a day).  ?Take Roxicodone for breakthrough pain every 4 hours.  ?Take Colace for constipation related to narcotic pain medication. If you do not have a  bowel movement in 2 days, take Miralax over the counter.  ?Drink plenty of water to also prevent constipation.  ? ?Contact Information: ?If you have questions or concerns, please call our office, [phone], Monday- Thursday 8AM-5PM and Friday 8AM-12Noon.  ?If it is after hours or on the weekend, please call [facility], [phone], [phone], and ask to speak to the surgeon on call for Dr. Constance Haw at [facility].  ? ?

## 2022-03-19 NOTE — Anesthesia Procedure Notes (Signed)
Procedure Name: LMA Insertion ?Date/Time: 03/19/2022 7:38 AM ?Performed by: Ollen Bowl, CRNA ?Pre-anesthesia Checklist: Patient identified, Patient being monitored, Emergency Drugs available, Timeout performed and Suction available ?Patient Re-evaluated:Patient Re-evaluated prior to induction ?Oxygen Delivery Method: Circle System Utilized ?Preoxygenation: Pre-oxygenation with 100% oxygen ?Induction Type: IV induction ?Ventilation: Mask ventilation without difficulty ?LMA: LMA inserted ?LMA Size: 4.0 ?Number of attempts: 1 ?Placement Confirmation: positive ETCO2 and breath sounds checked- equal and bilateral ? ? ? ? ?

## 2022-03-19 NOTE — Progress Notes (Signed)
Patient alert and oriented, hard of hearing at baseline. Patient currently has no complaints of pain, shortness of breath, chest pain, dizziness, nausea or vomiting. Patient tolerated PO meds and diet well. Appetite good. Right groin incision site remains clean, dry and intact currently. Patient able to move all extremities with no complaints.  ?Noted heart rate to be bradycardic and has been in the 40's. Patient denies any discomfort and is alert. Dr Tat and Dr Constance Haw made aware of heart rate and current vital signs. EKG done per Dr Tat and made aware of EKG being done. Nurse to nurse report given to CM. Patient currently denies needing anything at this time. ?

## 2022-03-19 NOTE — Hospital Course (Addendum)
81 y/o male with  chronic respiratory failure with hypoxia on 2L [place], COPD, gastroesophageal reflux disease, hypertension, history of chronic rhinitis, obstructive sleep apnea (wearing oxygen at nighttime); who presented to the hospital for elective inguinal hernia repair.  Post-op, [facility] was consulted for medical management due to his COPD and respiratory failure.   ?Patient denies fevers, chills, headache, chest pain, dyspnea, nausea, vomiting, diarrhea, abdominal pain, dysuria, hematuria, hematochezia, and melena. ?In the [facility]ACE, patient was afebrile and hemodynamically stable with oxygen saturation 100% on 2L.  He was awake and alert and followed commands.   ?Pt was observed overnight post op without any significant issues.  He continued to have sinus bradycardia.  ECG confirmed.  There was no AV block or sinus pauses.  He remained clinically stable on 2L and hemodynamically stable.  He was medically cleared for dc on 03/20/22 ?

## 2022-03-20 ENCOUNTER — Other Ambulatory Visit: Payer: Self-pay | Admitting: Family Medicine

## 2022-03-20 ENCOUNTER — Encounter (HOSPITAL_COMMUNITY): Payer: Self-pay | Admitting: General Surgery

## 2022-03-20 ENCOUNTER — Observation Stay (HOSPITAL_COMMUNITY): Payer: Medicare HMO

## 2022-03-20 DIAGNOSIS — Z85828 Personal history of other malignant neoplasm of skin: Secondary | ICD-10-CM | POA: Diagnosis not present

## 2022-03-20 DIAGNOSIS — I10 Essential (primary) hypertension: Secondary | ICD-10-CM

## 2022-03-20 DIAGNOSIS — J439 Emphysema, unspecified: Secondary | ICD-10-CM | POA: Diagnosis not present

## 2022-03-20 DIAGNOSIS — J449 Chronic obstructive pulmonary disease, unspecified: Secondary | ICD-10-CM | POA: Diagnosis not present

## 2022-03-20 DIAGNOSIS — R001 Bradycardia, unspecified: Secondary | ICD-10-CM | POA: Diagnosis not present

## 2022-03-20 DIAGNOSIS — Z87891 Personal history of nicotine dependence: Secondary | ICD-10-CM | POA: Diagnosis not present

## 2022-03-20 DIAGNOSIS — K4090 Unilateral inguinal hernia, without obstruction or gangrene, not specified as recurrent: Secondary | ICD-10-CM | POA: Diagnosis not present

## 2022-03-20 DIAGNOSIS — R058 Other specified cough: Secondary | ICD-10-CM | POA: Diagnosis not present

## 2022-03-20 DIAGNOSIS — J9611 Chronic respiratory failure with hypoxia: Secondary | ICD-10-CM | POA: Diagnosis not present

## 2022-03-20 DIAGNOSIS — J9811 Atelectasis: Secondary | ICD-10-CM | POA: Diagnosis not present

## 2022-03-20 DIAGNOSIS — K219 Gastro-esophageal reflux disease without esophagitis: Secondary | ICD-10-CM | POA: Diagnosis not present

## 2022-03-20 DIAGNOSIS — Z79899 Other long term (current) drug therapy: Secondary | ICD-10-CM | POA: Diagnosis not present

## 2022-03-20 MED ORDER — ONDANSETRON 4 MG PO TBDP: 4.0000 mg | ORAL_TABLET | Freq: Four times a day (QID) | ORAL | 0 refills | Status: DC | PRN

## 2022-03-20 MED ORDER — OXYCODONE HCL 5 MG PO TABS: 5.0000 mg | ORAL_TABLET | ORAL | 0 refills | Status: DC | PRN

## 2022-03-20 MED ORDER — DOCUSATE SODIUM 100 MG PO CAPS: 100.0000 mg | ORAL_CAPSULE | Freq: Two times a day (BID) | ORAL | 0 refills | Status: DC | PRN

## 2022-03-20 NOTE — Discharge Summary (Signed)
Physician Discharge Summary  ?Patient ID: ?Michael Evans ?MRN: [id number] ?DOB/AGE: July 13, 1941 81 y.o. ? ?Admit date: 03/19/2022 ?Discharge date: 03/20/2022 ? ?Admission Diagnoses: Right inguinal hernia ? ?Discharge Diagnoses:  ?Principal Problem: ?  Right inguinal hernia ?Active Problems: ?  Essential hypertension ?  COPD GOLD 2 ?  Chronic respiratory failure with hypoxia (HCC) ?  Upper airway cough syndrome ?  GERD (gastroesophageal reflux disease) ?  Sinus bradycardia ? ? ?Discharged Condition: good ? ?Hospital Course: Michael Evans is a very sweet 81 yo with lung disease on O2 at baseline. He had a right inguinal hernia repaired and did well. He was kept overnight for monitoring given his lung disease. He says he has no pain and is tolerating his diet. He is ready for Dc home. He is on his home O2 2L and O2 sats are good. He had some asymptomatic bradycardia post op, but the EKG showed sinus rhythm. He had no chest pain complaints. This has resolved.  ? ?Consults:  Hospitalist for help with post operative care  ? ?Significant Diagnostic Studies: CXR- atelectasis  ? ?Treatments: Right inguinal hernia repair 03/19/22 ? ?Discharge Exam: ?Blood pressure 125/61, pulse 65, temperature 97.8 ?F (36.6 ?C), temperature source Oral, resp. rate 18, height 5' 5"  (1.651 m), weight 66.8 kg, SpO2 91 %. ?General appearance: alert and no distress ?Resp: normal work of breathing, O2 2L ?GI: soft, nondistended, R inguinal incision c/d/I with some minor irritation from ice packs laterally, no other erythema and no drainage  ? ?Disposition: Discharge disposition: 01-Home or Self Care ? ? ? ? ? ? ?Discharge Instructions   ? ? Call MD for:  difficulty breathing, headache or visual disturbances   Complete by: As directed ?  ? Call MD for:  extreme fatigue   Complete by: As directed ?  ? Call MD for:  persistant dizziness or light-headedness   Complete by: As directed ?  ? Call MD for:  persistant nausea and vomiting   Complete by: As directed ?   ? Call MD for:  redness, tenderness, or signs of infection (pain, swelling, redness, odor or green/yellow discharge around incision site)   Complete by: As directed ?  ? Call MD for:  severe uncontrolled pain   Complete by: As directed ?  ? Call MD for:  temperature >100.4   Complete by: As directed ?  ? Increase activity slowly   Complete by: As directed ?  ? ?  ? ?Allergies as of 03/20/2022   ? ?   Reactions  ? Benzyl Alcohol Itching  ? Amlodipine Swelling  ? Swelling in feet  ? Aspirin Other (See Comments)  ? Upset stomach.  ? Other Hypertension  ? Hazelnuts  ? Penicillins Hives  ? Has patient had a PCN reaction causing immediate rash, facial/tongue/throat swelling, SOB or lightheadedness with hypotension: Yes ?Has patient had a PCN reaction causing severe rash involving mucus membranes or skin necrosis: No ?Has patient had a PCN reaction that required hospitalization No ?Has patient had a PCN reaction occurring within the last 10 years: No ?If all of the above answers are "NO", then may proceed with Cephalosporin use.  ? Sulfa Antibiotics Hives  ? ?  ? ?  ?Medication List  ?  ? ?TAKE these medications   ? ?albuterol 108 (90 Base) MCG/ACT inhaler ?Commonly known as: VENTOLIN HFA ?Inhale 1-2 puffs into the lungs every 6 (six) hours as needed for wheezing or shortness of breath. ?  ?Breztri Aerosphere 160-9-4.8 MCG/ACT  Aero ?Generic drug: Budeson-Glycopyrrol-Formoterol ?Inhale 2 puffs into the lungs in the morning and at bedtime. ?  ?cholecalciferol 25 MCG (1000 UNIT) tablet ?Commonly known as: VITAMIN D ?Take 3,000 Units by mouth daily. ?  ?cyanocobalamin 2000 MCG tablet ?Take 1,000 mcg by mouth daily. ?  ?dextromethorphan-guaiFENesin 30-600 MG 12hr tablet ?Commonly known as: [place] DM ?Take 2 tablets by mouth 2 (two) times daily as needed for cough (sputum). ?  ?docusate sodium 100 MG capsule ?Commonly known as: COLACE ?Take 1 capsule (100 mg total) by mouth 2 (two) times daily as needed for mild constipation. ?   ?Dulera 200-5 MCG/ACT Aero ?Generic drug: mometasone-formoterol ?Inhale 2 puffs into the lungs 2 (two) times daily. ?  ?FISH OIL CONCENTRATE PO ?Take 1,000 mg by mouth daily. ?  ?multivitamin tablet ?Take 1 tablet by mouth daily. Men's health ?  ?Nebulizer Misc ?1 each by Does not apply route daily as needed. PLEASE PROVIDE NEBULIZER KIT TO INCLUDE TUBING/MASK/MOUTHPIECE ?  ?ondansetron 4 MG disintegrating tablet ?Commonly known as: ZOFRAN-ODT ?Take 1 tablet (4 mg total) by mouth every 6 (six) hours as needed for nausea. ?  ?oxyCODONE 5 MG immediate release tablet ?Commonly known as: Oxy IR/ROXICODONE ?Take 1 tablet (5 mg total) by mouth every 4 (four) hours as needed for severe pain or breakthrough pain. ?  ?OXYGEN ?Inhale 2 L into the lungs continuous. ?  ?pyridoxine 100 MG tablet ?Commonly known as: B-6 ?Take 100 mg by mouth daily. ?  ?vitamin C 1000 MG tablet ?Take 1,000 mg by mouth daily. ?  ? ?  ? ? Follow-up Information   ? ? [name], MD Follow up on 04/22/2022.   ?Specialty: General Surgery ?Why: hernia check ?Contact information: ?[address] ?[place] [address] ?[phone] ? ? ?  ?  ? ?  ?  ? ?  ? ? ?Signed: ?[name] ?03/20/2022, 9:17 AM ? ? ?

## 2022-03-20 NOTE — TOC Progression Note (Signed)
?  ?  Transition of Care (TOC) Screening Note ? ? ?Patient Details  ?Name: Michael Evans ?Date of Birth: [birth year] ? ? ?Transition of Care (TOC) CM/SW Contact:    ?Boneta Lucks, RN ?Phone Number: ?03/20/2022, 10:33 AM ? ? ? ?Transition of Care Department [facility]) has reviewed patient and no TOC needs have been identified at this time. We will continue to monitor patient advancement through interdisciplinary progression rounds. If new patient transition needs arise, please place a TOC consult. ? ? ?Barriers to Discharge: No Barriers Identified ?  ? ?

## 2022-03-20 NOTE — Assessment & Plan Note (Signed)
Remained hemodynamically stable, mentation good ?No AV blocks, no sinus pauses noted on EKG or tele ?

## 2022-03-20 NOTE — Progress Notes (Signed)
Ng Discharge Note ? ?Admit Date:  03/19/2022 ?Discharge date: 03/20/2022 ?  ?Michael Evans to be D/C'd Home per MD order.  AVS completed. ?Patient/caregiver able to verbalize understanding. ? ?Discharge Medication: ?Allergies as of 03/20/2022   ? ?   Reactions  ? Benzyl Alcohol Itching  ? Amlodipine Swelling  ? Swelling in feet  ? Aspirin Other (See Comments)  ? Upset stomach.  ? Other Hypertension  ? Hazelnuts  ? Penicillins Hives  ? Has patient had a PCN reaction causing immediate rash, facial/tongue/throat swelling, SOB or lightheadedness with hypotension: Yes ?Has patient had a PCN reaction causing severe rash involving mucus membranes or skin necrosis: No ?Has patient had a PCN reaction that required hospitalization No ?Has patient had a PCN reaction occurring within the last 10 years: No ?If all of the above answers are "NO", then may proceed with Cephalosporin use.  ? Sulfa Antibiotics Hives  ? ?  ? ?  ?Medication List  ?  ? ?TAKE these medications   ? ?albuterol 108 (90 Base) MCG/ACT inhaler ?Commonly known as: VENTOLIN HFA ?Inhale 1-2 puffs into the lungs every 6 (six) hours as needed for wheezing or shortness of breath. ?  ?Breztri Aerosphere 160-9-4.8 MCG/ACT Aero ?Generic drug: Budeson-Glycopyrrol-Formoterol ?Inhale 2 puffs into the lungs in the morning and at bedtime. ?  ?cholecalciferol 25 MCG (1000 UNIT) tablet ?Commonly known as: VITAMIN D ?Take 3,000 Units by mouth daily. ?  ?cyanocobalamin 2000 MCG tablet ?Take 1,000 mcg by mouth daily. ?  ?dextromethorphan-guaiFENesin 30-600 MG 12hr tablet ?Commonly known as: [place] DM ?Take 2 tablets by mouth 2 (two) times daily as needed for cough (sputum). ?  ?docusate sodium 100 MG capsule ?Commonly known as: COLACE ?Take 1 capsule (100 mg total) by mouth 2 (two) times daily as needed for mild constipation. ?  ?Dulera 200-5 MCG/ACT Aero ?Generic drug: mometasone-formoterol ?Inhale 2 puffs into the lungs 2 (two) times daily. ?  ?FISH OIL CONCENTRATE PO ?Take  1,000 mg by mouth daily. ?  ?multivitamin tablet ?Take 1 tablet by mouth daily. Men's health ?  ?Nebulizer Misc ?1 each by Does not apply route daily as needed. PLEASE PROVIDE NEBULIZER KIT TO INCLUDE TUBING/MASK/MOUTHPIECE ?  ?ondansetron 4 MG disintegrating tablet ?Commonly known as: ZOFRAN-ODT ?Take 1 tablet (4 mg total) by mouth every 6 (six) hours as needed for nausea. ?  ?oxyCODONE 5 MG immediate release tablet ?Commonly known as: Oxy IR/ROXICODONE ?Take 1 tablet (5 mg total) by mouth every 4 (four) hours as needed for severe pain or breakthrough pain. ?  ?OXYGEN ?Inhale 2 L into the lungs continuous. ?  ?pyridoxine 100 MG tablet ?Commonly known as: B-6 ?Take 100 mg by mouth daily. ?  ?vitamin C 1000 MG tablet ?Take 1,000 mg by mouth daily. ?  ? ?  ? ? ?Discharge Assessment: ?Vitals:  ? 03/20/22 0812 03/20/22 0850  ?BP:    ?Pulse:    ?Resp:    ?Temp:    ?SpO2: 92% 91%  ? Skin clean, dry and intact without evidence of skin break down, no evidence of skin tears noted. ?IV catheter discontinued intact. Site without signs and symptoms of complications - no redness or edema noted at insertion site, patient denies c/o pain - only slight tenderness at site.  Dressing with slight pressure applied. ? ?D/c Instructions-Education: ?Discharge instructions given to patient/family with verbalized understanding. ?D/c education completed with patient/family including follow up instructions, medication list, d/c activities limitations if indicated, with other d/c instructions as indicated  by MD - patient able to verbalize understanding, all questions fully answered. ?Patient instructed to return to ED, call 911, or call MD for any changes in condition.  ?Patient escorted via [place], and D/C home via private auto. ? ?Tsosie Billing, LPN ?03/03/9794 36:92 AM   ?

## 2022-03-20 NOTE — Progress Notes (Signed)
?  ?       ?PROGRESS NOTE ? ?Michael Evans XWR:[id number] DOB: 1941-10-05 DOA: 03/19/2022 ?PCP: [name], MD ? ?Brief History:  ?81 y/o male with  chronic respiratory failure with hypoxia on 2L [place], COPD, gastroesophageal reflux disease, hypertension, history of chronic rhinitis, obstructive sleep apnea (wearing oxygen at nighttime); who presented to the hospital for elective inguinal hernia repair.  Post-op, [facility] was consulted for medical management due to his COPD and respiratory failure.   ?Patient denies fevers, chills, headache, chest pain, dyspnea, nausea, vomiting, diarrhea, abdominal pain, dysuria, hematuria, hematochezia, and melena. ?In the [facility], patient was afebrile and hemodynamically stable with oxygen saturation 100% on 2L.  He was awake and alert and followed commands.   ?Pt was observed overnight post op without any significant issues.  He continued to have sinus bradycardia.  ECG confirmed.  There was no AV block or sinus pauses.  He remained clinically stable on 2L and hemodynamically stable.  He was medically cleared for dc on 03/20/22  ? ? ? ?Assessment and Plan: ?* Right inguinal hernia ?-Status post surgical repair with mesh. ?-Overall procedure well tolerated ?-judicious opioids ?-post-op care per Dr. Constance Haw ? ?COPD GOLD 2 ?-Good saturation on chronic supplementation; no wheezing on exam.    ?- intermittent coughing spells. ?-Continue as needed bronchodilators and BEZTRI equivalent and the use of antitussive/mucolytic medications ?-continue . ?-Flutter valve and incentive spirometer ?Quit smoking 2003 ? ?Sinus bradycardia ?Remained hemodynamically stable, mentation good ?No AV blocks, no sinus pauses noted on EKG or tele ? ?GERD (gastroesophageal reflux disease) ?Continue PPI ? ?Upper airway cough syndrome ?-Chronic and intermittent ?-Continue as needed Mucinex and antitussive regimen. ?-Incentive spirometer and flutter valve recommended. ? ?Chronic respiratory failure with hypoxia  ([place]) ?-Chronically using 2 L ?-Saturation stable with chronic supplementation at this time. ?-Continue as needed bronchodilators. ?-encouraged IS and flutter valve ?Remained stable on 2L through hospitalization ? ?Essential hypertension ?Not on any meds outpt ?Diet controlled ?Monitor clinically ? ? ? ? ? ? ? ? ? ? ? ?Family Communication:   no Family at bedside ? ?Consultants:  [facility] ? ?Code Status:  FULL ? ? ? ? ?Procedures: ?As Listed in Progress Note Above ? ?Antibiotics: ?None ? ? ? ? ? ? ? ?Subjective: ?Patient denies fevers, chills, headache, chest pain, dyspnea, nausea, vomiting, diarrhea, abdominal pain, dysuria, hematuria, hematochezia, and melena. ? ? ?Objective: ?Vitals:  ? 03/20/22 0100 03/20/22 0200 03/20/22 0302 03/20/22 0812  ?BP: (!) 119/56 (!) 108/45 125/61   ?Pulse: (!) 53 (!) 59 65   ?Resp: '17 18 18   '$ ?Temp:   97.8 ?F (36.6 ?C)   ?TempSrc:   Oral   ?SpO2: 96% 95% 92% 92%  ?Weight:      ?Height:      ? ? ?Intake/Output Summary (Last 24 hours) at 03/20/2022 0847 ?Last data filed at 03/20/2022 1914 ?Gross per 24 hour  ?Intake 730 ml  ?Output 1800 ml  ?Net -1070 ml  ? ?Weight change:  ?Exam: ? ?General:  Pt is alert, follows commands appropriately, not in acute distress ?HEENT: No icterus, No thrush, No neck mass, [place]/AT ?Cardiovascular: RRR, S1/S2, no rubs, no gallops ?Respiratory: diminished BS.  No wheeze ?Abdomen: Soft/+BS, non tender, non distended, no guarding ?Extremities: No edema, No lymphangitis, No petechiae, No rashes, no synovitis ? ? ?Data Reviewed: ?I have personally reviewed following labs and imaging studies ?Basic Metabolic Panel: ?Recent Labs  ?Lab 03/19/22 ?7829  ?NA 139  ?K 4.4  ?  CL 104  ?CO2 28  ?GLUCOSE 107*  ?BUN 22  ?CREATININE 0.87  ?CALCIUM 9.1  ?MG 2.0  ? ?Liver Function Tests: ?No results for input(s): AST, ALT, ALKPHOS, BILITOT, PROT, ALBUMIN in the last 168 hours. ?No results for input(s): LIPASE, AMYLASE in the last 168 hours. ?No results for input(s): AMMONIA in the last  168 hours. ?Coagulation Profile: ?No results for input(s): INR, PROTIME in the last 168 hours. ?CBC: ?No results for input(s): WBC, NEUTROABS, HGB, HCT, MCV, PLT in the last 168 hours. ?Cardiac Enzymes: ?No results for input(s): CKTOTAL, CKMB, CKMBINDEX, TROPONINI in the last 168 hours. ?BNP: ?Invalid input(s): POCBNP ?CBG: ?No results for input(s): GLUCAP in the last 168 hours. ?HbA1C: ?No results for input(s): HGBA1C in the last 72 hours. ?Urine analysis: ?   ?Component Value Date/Time  ? [place] YELLOW 10/17/2021 2320  ? APPEARANCEUR CLEAR 10/17/2021 2320  ? LABSPEC 1.011 10/17/2021 2320  ? PHURINE 6.0 10/17/2021 2320  ? GLUCOSEU NEGATIVE 10/17/2021 2320  ? [place] NEGATIVE 10/17/2021 2320  ? [place] NEGATIVE 10/17/2021 2320  ? [place] NEGATIVE 10/17/2021 2320  ? PROTEINUR NEGATIVE 10/17/2021 2320  ? NITRITE NEGATIVE 10/17/2021 2320  ? LEUKOCYTESUR NEGATIVE 10/17/2021 2320  ? ?Sepsis Labs: ?'@LABRCNTIP'$ (procalcitonin:4,lacticidven:4) ?) ?Recent Results (from the past 240 hour(s))  ?MRSA Next Gen by PCR, Nasal     Status: None  ? Collection Time: 03/19/22 11:41 AM  ? Specimen: Nasal Mucosa; Nasal Swab  ?Result Value Ref Range Status  ? MRSA by PCR Next Gen NOT DETECTED NOT DETECTED Final  ?  Comment: (NOTE) ?The GeneXpert MRSA Assay (FDA approved for NASAL specimens only), ?is one component of a comprehensive MRSA colonization surveillance ?program. It is not intended to diagnose MRSA infection nor to guide ?or monitor treatment for MRSA infections. ?Test performance is not FDA approved in patients less than 2 years ?old. ?Performed at [facility], [address], [place], [place] [address] ?  ?  ? ?Scheduled Meds: ? vitamin C  1,000 mg Oral Daily  ? Chlorhexidine Gluconate Cloth  6 each Topical Q0600  ? cholecalciferol  3,000 Units Oral Daily  ? docusate sodium  100 mg Oral BID  ? guaiFENesin  1,200 mg Oral BID  ? mouth rinse  15 mL Mouth Rinse BID  ? mometasone-formoterol  2 puff Inhalation BID  ?  multivitamin with minerals  1 tablet Oral Daily  ? omega-3 acid ethyl esters  1 g Oral Daily  ? pantoprazole (PROTONIX) IV  40 mg Intravenous QHS  ? pyridoxine  100 mg Oral Daily  ? umeclidinium bromide  1 puff Inhalation Daily  ? cyanocobalamin  1,000 mcg Oral Daily  ? ?Continuous Infusions: ? ?Procedures/Studies: ?DG Chest Port 1 View ? ?Result Date: 03/20/2022 ?CLINICAL DATA:  COPD.  Recent right inguinal hernia repair EXAM: PORTABLE CHEST 1 VIEW COMPARISON:  12/10/2021 FINDINGS: Artifact from EKG pads. Chronic elevation of the right diaphragm. Streaky density at the lung bases. No edema, effusion, or pneumothorax. Biapical lucency from emphysema. Normal heart size and mediastinal contours. IMPRESSION: 1. Atelectasis at the lung bases. 2. Emphysema. Electronically Signed   By: Jorje Guild M.D.   On: 03/20/2022 07:23   ? ?Orson Eva, DO  ?[facility] ? ?If 7PM-7AM, please contact night-coverage ?[email or web address] ?Password [email or web address] ?03/20/2022, 8:47 AM ? ? LOS: 0 days  ? ?

## 2022-04-22 ENCOUNTER — Ambulatory Visit (INDEPENDENT_AMBULATORY_CARE_PROVIDER_SITE_OTHER): Payer: Medicare HMO | Admitting: General Surgery

## 2022-04-22 ENCOUNTER — Encounter: Payer: Self-pay | Admitting: General Surgery

## 2022-04-22 VITALS — BP 165/75 | HR 63 | Temp 97.6°F | Resp 18 | Ht 65.0 in | Wt 137.0 lb

## 2022-04-22 DIAGNOSIS — K4090 Unilateral inguinal hernia, without obstruction or gangrene, not specified as recurrent: Secondary | ICD-10-CM

## 2022-04-22 NOTE — Patient Instructions (Signed)
Start lifting and doing more work this week. You are clear to do any work you want starting 05/07/2022 (8 weeks after surgery). This week and next week, start back gradually and slowly lift things over 20 lbs, 30 lbs, etc.

## 2022-04-23 NOTE — Progress Notes (Signed)
[facility]  Right inguinal incisions healed. No complaints. Wants to start working.   BP (!) 165/75   Pulse 63   Temp 97.6 F (36.4 C) (Oral)   Resp 18   Ht '5\' 5"'$  (1.651 m)   Wt 137 lb (62.1 kg)   SpO2 92%   BMI 22.80 kg/m   Patient s/p R inguinal hernia repair with mesh.  Start lifting and doing more work this week. You are clear to do any work you want starting 05/07/2022 (8 weeks after surgery). This week and next week, start back gradually and slowly lift things over 20 lbs, 30 lbs, etc.   PRN follow up  Curlene Labrum, MD [facility] [address] [place], [place] [address] [phone] (office)

## 2022-04-24 DIAGNOSIS — H2512 Age-related nuclear cataract, left eye: Secondary | ICD-10-CM | POA: Diagnosis not present

## 2022-04-24 DIAGNOSIS — H01004 Unspecified blepharitis left upper eyelid: Secondary | ICD-10-CM | POA: Diagnosis not present

## 2022-04-24 DIAGNOSIS — H35371 Puckering of macula, right eye: Secondary | ICD-10-CM | POA: Diagnosis not present

## 2022-04-24 DIAGNOSIS — H01005 Unspecified blepharitis left lower eyelid: Secondary | ICD-10-CM | POA: Diagnosis not present

## 2022-04-24 DIAGNOSIS — H01001 Unspecified blepharitis right upper eyelid: Secondary | ICD-10-CM | POA: Diagnosis not present

## 2022-04-24 DIAGNOSIS — H01002 Unspecified blepharitis right lower eyelid: Secondary | ICD-10-CM | POA: Diagnosis not present

## 2022-04-29 ENCOUNTER — Other Ambulatory Visit: Payer: Self-pay | Admitting: Family Medicine

## 2022-04-29 ENCOUNTER — Telehealth: Payer: Self-pay

## 2022-04-29 MED ORDER — AZITHROMYCIN 250 MG PO TABS: ORAL_TABLET | ORAL | 0 refills | Status: DC

## 2022-04-29 NOTE — Telephone Encounter (Signed)
Had a bad cough for about a week, cough mucous. Pt would like to know if you can send Rx ?   Advice pt that should need to, then go to urgent care as well

## 2022-04-30 NOTE — Telephone Encounter (Signed)
Called pt, aware Rx sent to pharmacy.

## 2022-05-05 ENCOUNTER — Emergency Department (HOSPITAL_COMMUNITY): Payer: Medicare HMO

## 2022-05-05 ENCOUNTER — Emergency Department (HOSPITAL_COMMUNITY)
Admission: EM | Admit: 2022-05-05 | Discharge: 2022-05-05 | Disposition: A | Discharge status: Home / Self Care | Payer: Medicare HMO | Attending: Emergency Medicine | Admitting: Emergency Medicine

## 2022-05-05 ENCOUNTER — Encounter (HOSPITAL_COMMUNITY): Payer: Self-pay | Admitting: *Deleted

## 2022-05-05 ENCOUNTER — Other Ambulatory Visit: Payer: Self-pay

## 2022-05-05 DIAGNOSIS — J439 Emphysema, unspecified: Secondary | ICD-10-CM | POA: Diagnosis not present

## 2022-05-05 DIAGNOSIS — J449 Chronic obstructive pulmonary disease, unspecified: Secondary | ICD-10-CM | POA: Diagnosis not present

## 2022-05-05 DIAGNOSIS — I10 Essential (primary) hypertension: Secondary | ICD-10-CM | POA: Insufficient documentation

## 2022-05-05 DIAGNOSIS — R059 Cough, unspecified: Secondary | ICD-10-CM | POA: Diagnosis not present

## 2022-05-05 DIAGNOSIS — J441 Chronic obstructive pulmonary disease with (acute) exacerbation: Secondary | ICD-10-CM | POA: Insufficient documentation

## 2022-05-05 DIAGNOSIS — Z79899 Other long term (current) drug therapy: Secondary | ICD-10-CM | POA: Diagnosis not present

## 2022-05-05 DIAGNOSIS — R079 Chest pain, unspecified: Secondary | ICD-10-CM | POA: Diagnosis not present

## 2022-05-05 DIAGNOSIS — Z7951 Long term (current) use of inhaled steroids: Secondary | ICD-10-CM | POA: Diagnosis not present

## 2022-05-05 LAB — BASIC METABOLIC PANEL
Anion gap: 5 (ref 5–15)
BUN: 30 mg/dL — ABNORMAL HIGH (ref 8–23)
CO2: 29 mmol/L (ref 22–32)
Calcium: 9.9 mg/dL (ref 8.9–10.3)
Chloride: 104 mmol/L (ref 98–111)
Creatinine, Ser: 0.86 mg/dL (ref 0.61–1.24)
GFR, Estimated: >60 mL/min (ref >60)
Glucose, Bld: 125 mg/dL — ABNORMAL HIGH (ref 70–99)
Potassium: 4.6 mmol/L (ref 3.5–5.1)
Sodium: 138 mmol/L (ref 135–145)

## 2022-05-05 LAB — CBC
HCT: 46.8 % (ref 39.0–52.0)
Hemoglobin: 15.4 g/dL (ref 13.0–17.0)
MCH: 29.4 pg (ref 26.0–34.0)
MCHC: 32.9 g/dL (ref 30.0–36.0)
MCV: 89.5 fL (ref 80.0–100.0)
Platelets: 280 10*3/uL (ref 150–400)
RBC: 5.23 MIL/uL (ref 4.22–5.81)
RDW: 13.2 % (ref 11.5–15.5)
WBC: 11 10*3/uL — ABNORMAL HIGH (ref 4.0–10.5)
nRBC: 0 % (ref 0.0–0.2)

## 2022-05-05 LAB — TROPONIN I (HIGH SENSITIVITY)
Troponin I (High Sensitivity): 4 ng/L (ref <18)
Troponin I (High Sensitivity): 3 ng/L (ref <18)

## 2022-05-05 MED ORDER — PREDNISONE 20 MG PO TABS: 40.0000 mg | ORAL_TABLET | Freq: Every day | ORAL | 0 refills | Status: DC

## 2022-05-05 MED ORDER — GUAIFENESIN 100 MG/5ML PO LIQD
5.0000 mL | Freq: Once | ORAL | Status: AC
  Administered 2022-05-05: 5 mL via ORAL
  Filled 2022-05-05: qty 5

## 2022-05-05 MED ORDER — GUAIFENESIN ER 600 MG PO TB12: 600.0000 mg | ORAL_TABLET | Freq: Two times a day (BID) | ORAL | 0 refills | Status: DC

## 2022-05-05 MED ORDER — PREDNISONE 10 MG PO TABS: 20.0000 mg | ORAL_TABLET | Freq: Every day | ORAL | 0 refills | Status: DC

## 2022-05-05 MED ORDER — METHYLPREDNISOLONE SODIUM SUCC 125 MG IJ SOLR
125.0000 mg | Freq: Once | INTRAMUSCULAR | Status: AC
  Administered 2022-05-05: 125 mg via INTRAVENOUS
  Filled 2022-05-05: qty 2

## 2022-05-05 MED ORDER — IPRATROPIUM-ALBUTEROL 0.5-2.5 (3) MG/3ML IN SOLN
3.0000 mL | Freq: Once | RESPIRATORY_TRACT | Status: AC
  Administered 2022-05-05: 3 mL via RESPIRATORY_TRACT
  Filled 2022-05-05: qty 3

## 2022-05-05 NOTE — Discharge Instructions (Signed)
Take your next dose of the prednisone tomorrow with lunch.  Continue using the Mucinex to help keep your cough productive.  Is important that you drink plenty of fluids while taking this medication.  Continue your other home medications.  Get rechecked for any new or worsening symptoms.  Also make sure you complete the Zithromax that you are doctor has already prescribed you.

## 2022-05-05 NOTE — ED Provider Notes (Signed)
[facility] EMERGENCY DEPARTMENT Provider Note   CSN: [id number] Arrival date & time: 05/05/22  1156     History  Chief Complaint  Patient presents with   Cough    Michael Evans is a 81 y.o. male with history including COPD Gold, hypertension, GERD, also sleep apnea, on chronic Home oxygen at 2 L presenting with a 1 week history of increased cough and shortness of breath.  His cough has been productive of a yellow to green sputum, additionally he reports pain in his midsternal region which is triggered by coughing only, described as "a sledgehammer" when I cough.  He feels like his shortness of breath is relatively stable at this point but his biggest complaint today is the cough.  He has had no fevers or chills, also no nausea or vomiting, no diaphoresis, no abdominal pain.  He has taken his home COPD medications including Breztri and Dulera, using albuterol as a rescue inhaler, stating he is needed 2 puffs of this inhaler today.  He has found no alleviators for his cough.  He is just completing a Z-Pak today which was prescribed by his PCP on May 30.  The history is provided by the patient and the spouse.      Home Medications Prior to Admission medications   Medication Sig Start Date End Date Taking? Authorizing Provider  guaiFENesin (MUCINEX) 600 MG 12 hr tablet Take 1 tablet (600 mg total) by mouth 2 (two) times daily. 05/05/22  Yes Cassandra Mcmanaman, Almyra Free, PA-C  albuterol (VENTOLIN HFA) 108 (90 Base) MCG/ACT inhaler INHALE 1-2 PUFFS BY MOUTH EVERY 6 HOURS AS NEEDED FOR WHEEZE OR SHORTNESS OF BREATH 03/20/22   [name], MD  Ascorbic Acid (VITAMIN C) 1000 MG tablet Take 1,000 mg by mouth daily.    [provider]  azithromycin (ZITHROMAX) 250 MG tablet 2 tabs poqday1, 1 tab poqday 2-5 04/29/22   [name], MD  Budeson-Glycopyrrol-Formoterol (BREZTRI AEROSPHERE) 160-9-4.8 MCG/ACT AERO Inhale 2 puffs into the lungs in the morning and at bedtime.    [provider]   cholecalciferol (VITAMIN D) 25 MCG (1000 UNIT) tablet Take 3,000 Units by mouth daily.    [provider]  cyanocobalamin 2000 MCG tablet Take 1,000 mcg by mouth daily.    [provider]  dextromethorphan-guaiFENesin (MUCINEX DM) 30-600 MG 12hr tablet Take 2 tablets by mouth 2 (two) times daily as needed for cough (sputum). 12/10/21   [name], MD  docusate sodium (COLACE) 100 MG capsule Take 1 capsule (100 mg total) by mouth 2 (two) times daily as needed for mild constipation. 03/20/22   [name], MD  mometasone-formoterol (DULERA) 200-5 MCG/ACT AERO Inhale 2 puffs into the lungs 2 (two) times daily.    [provider]  Multiple Vitamin (MULTIVITAMIN) tablet Take 1 tablet by mouth daily. Men's health    [provider]  Nebulizer MISC 1 each by Does not apply route daily as needed. PLEASE PROVIDE NEBULIZER KIT TO INCLUDE TUBING/MASK/MOUTHPIECE 10/04/21   [name], MD  Omega-3 Fatty Acids (FISH OIL CONCENTRATE PO) Take 1,000 mg by mouth daily.    [provider]  ondansetron (ZOFRAN-ODT) 4 MG disintegrating tablet Take 1 tablet (4 mg total) by mouth every 6 (six) hours as needed for nausea. 03/20/22   [name], MD  oxyCODONE (OXY IR/ROXICODONE) 5 MG immediate release tablet Take 1 tablet (5 mg total) by mouth every 4 (four) hours as needed for severe pain or  breakthrough pain. 03/20/22   [name], MD  OXYGEN Inhale 2 L into the lungs continuous.    [provider]  predniSONE (DELTASONE) 20 MG tablet Take 2 tablets (40 mg total) by mouth daily. 05/05/22   Evalee Jefferson, PA-C  pyridoxine (B-6) 100 MG tablet Take 100 mg by mouth daily.    [provider]      Allergies    Benzyl alcohol, Amlodipine, Aspirin, Other, Penicillins, and Sulfa antibiotics    Review of Systems   Review of Systems  Constitutional:  Negative for chills and fever.  HENT:  Negative for congestion and sore throat.    Eyes: Negative.   Respiratory:  Positive for cough and shortness of breath. Negative for chest tightness.   Cardiovascular:  Positive for chest pain. Negative for palpitations and leg swelling.  Gastrointestinal:  Negative for abdominal pain, nausea and vomiting.  Genitourinary: Negative.   Musculoskeletal:  Negative for arthralgias, joint swelling and neck pain.  Skin: Negative.  Negative for rash and wound.  Neurological:  Negative for dizziness, weakness, light-headedness, numbness and headaches.  Psychiatric/Behavioral: Negative.    All other systems reviewed and are negative.  Physical Exam Updated Vital Signs BP 133/72   Pulse 90   Temp 98.4 F (36.9 C) (Oral)   Resp (!) 27   Ht 5' 5"  (1.651 m)   Wt 62.5 kg   SpO2 92%   BMI 22.91 kg/m  Physical Exam Vitals and nursing note reviewed.  Constitutional:      Appearance: He is well-developed.  HENT:     Head: Normocephalic and atraumatic.  Eyes:     Conjunctiva/sclera: Conjunctivae normal.  Cardiovascular:     Rate and Rhythm: Normal rate and regular rhythm.     Heart sounds: Normal heart sounds.  Pulmonary:     Effort: Pulmonary effort is normal.     Breath sounds: No stridor. Decreased breath sounds and wheezing present. No rhonchi or rales.     Comments: Presenting on his 2 L oxygen per nasal cannula.  Decreased breath sounds throughout all lung fields.  Expiratory wheezes present. Abdominal:     General: Bowel sounds are normal.     Palpations: Abdomen is soft.     Tenderness: There is no abdominal tenderness.  Musculoskeletal:        General: Normal range of motion.     Cervical back: Normal range of motion.  Skin:    General: Skin is warm and dry.  Neurological:     Mental Status: He is alert.    ED Results / Procedures / Treatments   Labs (all labs ordered are listed, but only abnormal results are displayed) Labs Reviewed  BASIC METABOLIC PANEL - Abnormal; Notable for the following components:       Result Value   Glucose, Bld 125 (*)    BUN 30 (*)    All other components within normal limits  CBC - Abnormal; Notable for the following components:   WBC 11.0 (*)    All other components within normal limits  TROPONIN I (HIGH SENSITIVITY)  TROPONIN I (HIGH SENSITIVITY)    EKG EKG Interpretation  Date/Time:  Monday May 05 2022 12:36:31 EDT Ventricular Rate:  66 PR Interval:  140 QRS Duration: 74 QT Interval:  364 QTC Calculation: 381 R Axis:   -83 Text Interpretation: Normal sinus rhythm Left axis deviation Septal infarct , age undetermined Abnormal ECG When compared with ECG of 19-Mar-2022 15:32, Septal infarct is now Present  Nonspecific T wave abnormality no longer evident in Inferior leads Confirmed by Godfrey Pick [phone]) on 05/05/2022 1:58:01 PM  Radiology DG Chest 2 View  Result Date: 05/05/2022 CLINICAL DATA:  Chest pain, cough EXAM: CHEST - 2 VIEW COMPARISON:  Previous studies including the examination of 03/30/2022 FINDINGS: Cardiac size is within normal limits. Emphysematous changes are noted in both lungs. There is increase in AP diameter of chest. There are linear densities in the lower lung fields. No new focal pulmonary consolidation is seen. There are no signs of alveolar pulmonary edema. There is no pleural effusion or pneumothorax. IMPRESSION: COPD. There are linear densities in both lower lung fields, more so on the right side suggesting scarring or subsegmental atelectasis. There is no focal pulmonary consolidation. Electronically Signed   By: Elmer Picker M.D.   On: 05/05/2022 13:15    Procedures Procedures    Medications Ordered in ED Medications  guaiFENesin (ROBITUSSIN) 100 MG/5ML liquid 5 mL (5 mLs Oral Given 05/05/22 1439)  methylPREDNISolone sodium succinate (SOLU-MEDROL) 125 mg/2 mL injection 125 mg (125 mg Intravenous Given 05/05/22 1440)  ipratropium-albuterol (DUONEB) 0.5-2.5 (3) MG/3ML nebulizer solution 3 mL (3 mLs Nebulization Given 05/05/22 1447)     ED Course/ Medical Decision Making/ A&P                           Medical Decision Making Patient with a history of COPD, hypertension, GERD, sleep apnea on chronic home oxygen 2 L presenting with increased cough and difficulty with expectorating sputum.  He does endorse shortness of breath with exertion over the past several days, denies chest pain at rest or with exertion but does describe midsternal pain which is triggered by cough only.  Afebrile.  He was given an albuterol and Atrovent DuoNeb along with an IV dose of Solu-Medrol, was also given a dose of Robitussin after which his cough was much more productive.  He had no increased work of breathing and his wheezing had nearly resolved after the neb treatment.  He felt significantly improved after this treatment and was ambulatory in the department without desaturation.  He is currently on day 3 of a 5-day course of Zithromax.  Amount and/or Complexity of Data Reviewed Labs: ordered.    Details: Labs including a be met, CBC and delta troponins are normal. Radiology: ordered.    Details: No pneumonia noted on chest x-ray.  Risk OTC drugs. Prescription drug management. Risk Details: Discussed admission versus discharge to home with close follow-up.  Patient is motivated to go home.  He states his biggest problem was the nonproductive cough and now that he is able to cough and bring up the sputum he is feeling significantly better.  He does not want to be admitted.  We did discuss close follow-up and return here for any new or worsening symptoms.  He was encouraged to continue his Zithromax, he was also placed on a prednisone pulse dose, additional Mucinex also prescribed.           Final Clinical Impression(s) / ED Diagnoses Final diagnoses:  Chronic obstructive pulmonary disease with acute exacerbation ([place])    Rx / DC Orders ED Discharge Orders          Ordered    predniSONE (DELTASONE) 10 MG tablet  Daily,   Status:   Discontinued        05/05/22 1705    guaiFENesin (MUCINEX) 600 MG 12 hr tablet  2 times daily  05/05/22 1705    predniSONE (DELTASONE) 20 MG tablet  Daily        05/05/22 1706              Evalee Jefferson, PA-C 05/05/22 1800    Godfrey Pick, MD 05/06/22 [phone]

## 2022-05-05 NOTE — ED Triage Notes (Signed)
Pt with cough for a week.  CP for past few days. Home O2 at 2 L/M most of the time per pt.  +SOB.  Productive cough, yellow in color per pt.

## 2022-05-06 ENCOUNTER — Telehealth: Payer: Self-pay | Admitting: Family Medicine

## 2022-05-06 NOTE — Telephone Encounter (Signed)
FYI  Patient left vm yesterday requesting an appointment for lungs,cough, and congestion. I called patient to advise that Dr. Dennard Schaumann was out of the office and see how he was doing. He states he went tot he ED yesterday and they gave him steroids. I have scheduled him an appointment for Monday for follow up.

## 2022-05-12 ENCOUNTER — Ambulatory Visit: Payer: Self-pay | Admitting: Family Medicine

## 2022-05-23 ENCOUNTER — Ambulatory Visit (INDEPENDENT_AMBULATORY_CARE_PROVIDER_SITE_OTHER): Payer: Medicare HMO | Admitting: Family Medicine

## 2022-05-23 ENCOUNTER — Encounter (HOSPITAL_COMMUNITY): Payer: Self-pay | Admitting: Emergency Medicine

## 2022-05-23 ENCOUNTER — Other Ambulatory Visit: Payer: Self-pay

## 2022-05-23 ENCOUNTER — Emergency Department (HOSPITAL_COMMUNITY): Payer: Medicare HMO

## 2022-05-23 ENCOUNTER — Observation Stay (HOSPITAL_COMMUNITY)
Admission: EM | Admit: 2022-05-23 | Discharge: 2022-05-24 | Disposition: A | Discharge status: Home / Self Care | Payer: Medicare HMO | Attending: Internal Medicine | Admitting: Internal Medicine

## 2022-05-23 VITALS — HR 134 | Temp 100.4°F | Ht 65.0 in | Wt 131.0 lb

## 2022-05-23 DIAGNOSIS — R739 Hyperglycemia, unspecified: Secondary | ICD-10-CM | POA: Diagnosis not present

## 2022-05-23 DIAGNOSIS — R000 Tachycardia, unspecified: Secondary | ICD-10-CM

## 2022-05-23 DIAGNOSIS — A419 Sepsis, unspecified organism: Principal | ICD-10-CM | POA: Diagnosis present

## 2022-05-23 DIAGNOSIS — Z79899 Other long term (current) drug therapy: Secondary | ICD-10-CM | POA: Insufficient documentation

## 2022-05-23 DIAGNOSIS — Z20822 Contact with and (suspected) exposure to covid-19: Secondary | ICD-10-CM | POA: Diagnosis not present

## 2022-05-23 DIAGNOSIS — J122 Parainfluenza virus pneumonia: Secondary | ICD-10-CM | POA: Diagnosis not present

## 2022-05-23 DIAGNOSIS — R509 Fever, unspecified: Secondary | ICD-10-CM

## 2022-05-23 DIAGNOSIS — Z87891 Personal history of nicotine dependence: Secondary | ICD-10-CM | POA: Diagnosis not present

## 2022-05-23 DIAGNOSIS — I10 Essential (primary) hypertension: Secondary | ICD-10-CM | POA: Insufficient documentation

## 2022-05-23 DIAGNOSIS — J441 Chronic obstructive pulmonary disease with (acute) exacerbation: Secondary | ICD-10-CM

## 2022-05-23 DIAGNOSIS — R0902 Hypoxemia: Secondary | ICD-10-CM | POA: Diagnosis not present

## 2022-05-23 DIAGNOSIS — K4090 Unilateral inguinal hernia, without obstruction or gangrene, not specified as recurrent: Secondary | ICD-10-CM | POA: Insufficient documentation

## 2022-05-23 DIAGNOSIS — J439 Emphysema, unspecified: Secondary | ICD-10-CM | POA: Diagnosis not present

## 2022-05-23 DIAGNOSIS — N400 Enlarged prostate without lower urinary tract symptoms: Secondary | ICD-10-CM | POA: Diagnosis not present

## 2022-05-23 DIAGNOSIS — J189 Pneumonia, unspecified organism: Secondary | ICD-10-CM | POA: Diagnosis not present

## 2022-05-23 DIAGNOSIS — M25552 Pain in left hip: Secondary | ICD-10-CM | POA: Diagnosis not present

## 2022-05-23 DIAGNOSIS — J449 Chronic obstructive pulmonary disease, unspecified: Secondary | ICD-10-CM | POA: Insufficient documentation

## 2022-05-23 DIAGNOSIS — R0602 Shortness of breath: Secondary | ICD-10-CM | POA: Diagnosis not present

## 2022-05-23 DIAGNOSIS — I700 Atherosclerosis of aorta: Secondary | ICD-10-CM | POA: Diagnosis not present

## 2022-05-23 DIAGNOSIS — R102 Pelvic and perineal pain: Secondary | ICD-10-CM | POA: Diagnosis not present

## 2022-05-23 DIAGNOSIS — Z85828 Personal history of other malignant neoplasm of skin: Secondary | ICD-10-CM | POA: Insufficient documentation

## 2022-05-23 DIAGNOSIS — J9811 Atelectasis: Secondary | ICD-10-CM | POA: Diagnosis not present

## 2022-05-23 LAB — CBC
HCT: 44.3 % (ref 39.0–52.0)
HCT: 39.3 % (ref 39.0–52.0)
Hemoglobin: 14.5 g/dL (ref 13.0–17.0)
Hemoglobin: 13 g/dL (ref 13.0–17.0)
MCH: 29.1 pg (ref 26.0–34.0)
MCH: 29.7 pg (ref 26.0–34.0)
MCHC: 32.7 g/dL (ref 30.0–36.0)
MCHC: 33.1 g/dL (ref 30.0–36.0)
MCV: 89 fL (ref 80.0–100.0)
MCV: 89.9 fL (ref 80.0–100.0)
Platelets: 294 10*3/uL (ref 150–400)
Platelets: 238 10*3/uL (ref 150–400)
RBC: 4.98 MIL/uL (ref 4.22–5.81)
RBC: 4.37 MIL/uL (ref 4.22–5.81)
RDW: 13.2 % (ref 11.5–15.5)
RDW: 13.2 % (ref 11.5–15.5)
WBC: 23.6 10*3/uL — ABNORMAL HIGH (ref 4.0–10.5)
WBC: 21.8 10*3/uL — ABNORMAL HIGH (ref 4.0–10.5)
nRBC: 0 % (ref 0.0–0.2)
nRBC: 0 % (ref 0.0–0.2)

## 2022-05-23 LAB — RESPIRATORY PANEL BY PCR

## 2022-05-23 LAB — URINALYSIS, ROUTINE W REFLEX MICROSCOPIC
Bacteria, UA: NONE SEEN
Bilirubin Urine: NEGATIVE
Glucose, UA: NEGATIVE mg/dL
Hgb urine dipstick: NEGATIVE
Ketones, ur: NEGATIVE mg/dL
Nitrite: NEGATIVE
Protein, ur: NEGATIVE mg/dL
Specific Gravity, Urine: 1.012 (ref 1.005–1.030)
pH: 7 (ref 5.0–8.0)

## 2022-05-23 LAB — LACTIC ACID, PLASMA
Lactic Acid, Venous: 1.6 mmol/L (ref 0.5–1.9)
Lactic Acid, Venous: 1.6 mmol/L (ref 0.5–1.9)

## 2022-05-23 LAB — DIFFERENTIAL
Abs Immature Granulocytes: 0.16 10*3/uL — ABNORMAL HIGH (ref 0.00–0.07)
Basophils Absolute: 0 10*3/uL (ref 0.0–0.1)
Basophils Relative: 0 %
Eosinophils Absolute: 0 10*3/uL (ref 0.0–0.5)
Eosinophils Relative: 0 %
Immature Granulocytes: 1 %
Lymphocytes Relative: 4 %
Lymphs Abs: 1 10*3/uL (ref 0.7–4.0)
Monocytes Absolute: 2.2 10*3/uL — ABNORMAL HIGH (ref 0.1–1.0)
Monocytes Relative: 9 %
Neutro Abs: 20.5 10*3/uL — ABNORMAL HIGH (ref 1.7–7.7)
Neutrophils Relative %: 86 %

## 2022-05-23 LAB — HEPATIC FUNCTION PANEL
ALT: 24 U/L (ref 0–44)
AST: 22 U/L (ref 15–41)
Albumin: 3.3 g/dL — ABNORMAL LOW (ref 3.5–5.0)
Alkaline Phosphatase: 55 U/L (ref 38–126)
Bilirubin, Direct: 0.1 mg/dL (ref 0.0–0.2)
Indirect Bilirubin: 0.5 mg/dL (ref 0.3–0.9)
Total Bilirubin: 0.6 mg/dL (ref 0.3–1.2)
Total Protein: 7 g/dL (ref 6.5–8.1)

## 2022-05-23 LAB — PROCALCITONIN: Procalcitonin: 0.17 ng/mL

## 2022-05-23 LAB — CREATININE, SERUM
Creatinine, Ser: 0.92 mg/dL (ref 0.61–1.24)
GFR, Estimated: >60 mL/min (ref >60)

## 2022-05-23 LAB — BASIC METABOLIC PANEL
Anion gap: 9 (ref 5–15)
BUN: 28 mg/dL — ABNORMAL HIGH (ref 8–23)
CO2: 27 mmol/L (ref 22–32)
Calcium: 9.8 mg/dL (ref 8.9–10.3)
Chloride: 101 mmol/L (ref 98–111)
Creatinine, Ser: 0.91 mg/dL (ref 0.61–1.24)
GFR, Estimated: >60 mL/min (ref >60)
Glucose, Bld: 182 mg/dL — ABNORMAL HIGH (ref 70–99)
Potassium: 3.8 mmol/L (ref 3.5–5.1)
Sodium: 137 mmol/L (ref 135–145)

## 2022-05-23 LAB — SARS CORONAVIRUS 2 BY RT PCR: SARS Coronavirus 2 by RT PCR: NEGATIVE

## 2022-05-23 LAB — PROTIME-INR
INR: 1.1 (ref 0.8–1.2)
Prothrombin Time: 13.7 seconds (ref 11.4–15.2)

## 2022-05-23 LAB — CBG MONITORING, ED: Glucose-Capillary: 132 mg/dL — ABNORMAL HIGH (ref 70–99)

## 2022-05-23 LAB — GLUCOSE, CAPILLARY: Glucose-Capillary: 95 mg/dL (ref 70–99)

## 2022-05-23 LAB — APTT: aPTT: 25 seconds (ref 24–36)

## 2022-05-23 MED ORDER — VANCOMYCIN HCL 1250 MG/250ML IV SOLN
1250.0000 mg | Freq: Once | INTRAVENOUS | Status: DC
  Filled 2022-05-23: qty 250

## 2022-05-23 MED ORDER — ACETAMINOPHEN 500 MG PO TABS: 1000.0000 mg | ORAL_TABLET | Freq: Once | ORAL | Status: DC

## 2022-05-23 MED ORDER — IOHEXOL 300 MG/ML  SOLN
100.0000 mL | Freq: Once | INTRAMUSCULAR | Status: AC | PRN
  Administered 2022-05-23: 100 mL via INTRAVENOUS

## 2022-05-23 MED ORDER — OXYCODONE HCL 5 MG PO TABS: 5.0000 mg | ORAL_TABLET | ORAL | Status: DC | PRN

## 2022-05-23 MED ORDER — VITAMIN B-12 1000 MCG PO TABS
1000.0000 ug | ORAL_TABLET | Freq: Every day | ORAL | Status: DC
  Administered 2022-05-24: 1000 ug via ORAL
  Filled 2022-05-23: qty 1

## 2022-05-23 MED ORDER — ACETAMINOPHEN 650 MG RE SUPP: 650.0000 mg | Freq: Four times a day (QID) | RECTAL | Status: DC | PRN

## 2022-05-23 MED ORDER — SODIUM CHLORIDE 0.9 % IV BOLUS
1000.0000 mL | Freq: Once | INTRAVENOUS | Status: AC
  Administered 2022-05-23: 1000 mL via INTRAVENOUS

## 2022-05-23 MED ORDER — INSULIN ASPART 100 UNIT/ML IJ SOLN
0.0000 [IU] | Freq: Three times a day (TID) | INTRAMUSCULAR | Status: DC
  Administered 2022-05-24: 1 [IU] via SUBCUTANEOUS

## 2022-05-23 MED ORDER — AZITHROMYCIN 500 MG IV SOLR
500.0000 mg | Freq: Once | INTRAVENOUS | Status: AC
Start: 2022-05-23 — End: 2022-05-23
  Administered 2022-05-23: 500 mg via INTRAVENOUS
  Filled 2022-05-23: qty 5

## 2022-05-23 MED ORDER — SODIUM CHLORIDE 0.9 % IV SOLN: 2.0000 g | Freq: Two times a day (BID) | INTRAVENOUS | Status: DC

## 2022-05-23 MED ORDER — ONDANSETRON HCL 4 MG PO TABS: 4.0000 mg | ORAL_TABLET | Freq: Four times a day (QID) | ORAL | Status: DC | PRN

## 2022-05-23 MED ORDER — ENOXAPARIN SODIUM 40 MG/0.4ML IJ SOSY
40.0000 mg | PREFILLED_SYRINGE | INTRAMUSCULAR | Status: DC
  Administered 2022-05-23: 40 mg via SUBCUTANEOUS
  Filled 2022-05-23: qty 0.4

## 2022-05-23 MED ORDER — MOMETASONE FURO-FORMOTEROL FUM 200-5 MCG/ACT IN AERO
2.0000 | INHALATION_SPRAY | Freq: Two times a day (BID) | RESPIRATORY_TRACT | Status: DC
  Administered 2022-05-23 – 2022-05-24 (×2): 2 via RESPIRATORY_TRACT
  Filled 2022-05-23: qty 8.8

## 2022-05-23 MED ORDER — ALBUTEROL SULFATE HFA 108 (90 BASE) MCG/ACT IN AERS
2.0000 | INHALATION_SPRAY | RESPIRATORY_TRACT | Status: DC | PRN
  Administered 2022-05-23: 2 via RESPIRATORY_TRACT
  Filled 2022-05-23: qty 6.7

## 2022-05-23 MED ORDER — SODIUM CHLORIDE 0.9 % IV SOLN
2.0000 g | Freq: Once | INTRAVENOUS | Status: AC
  Administered 2022-05-23: 2 g via INTRAVENOUS
  Filled 2022-05-23: qty 12.5

## 2022-05-23 MED ORDER — IPRATROPIUM-ALBUTEROL 0.5-2.5 (3) MG/3ML IN SOLN: 3.0000 mL | Freq: Four times a day (QID) | RESPIRATORY_TRACT | Status: DC | PRN

## 2022-05-23 MED ORDER — INSULIN ASPART 100 UNIT/ML IJ SOLN: 0.0000 [IU] | Freq: Every day | INTRAMUSCULAR | Status: DC

## 2022-05-23 MED ORDER — METRONIDAZOLE 500 MG/100ML IV SOLN
500.0000 mg | Freq: Once | INTRAVENOUS | Status: DC
  Administered 2022-05-23: 500 mg via INTRAVENOUS
  Filled 2022-05-23: qty 100

## 2022-05-23 MED ORDER — ONDANSETRON HCL 4 MG/2ML IJ SOLN: 4.0000 mg | Freq: Four times a day (QID) | INTRAMUSCULAR | Status: DC | PRN

## 2022-05-23 MED ORDER — VITAMIN D 25 MCG (1000 UNIT) PO TABS
3000.0000 [IU] | ORAL_TABLET | Freq: Every day | ORAL | Status: DC
  Administered 2022-05-24: 3000 [IU] via ORAL
  Filled 2022-05-23: qty 3

## 2022-05-23 MED ORDER — VITAMIN B-6 50 MG PO TABS
100.0000 mg | ORAL_TABLET | Freq: Every day | ORAL | Status: DC
Start: 2022-05-23 — End: 2022-05-24
  Administered 2022-05-23 – 2022-05-24 (×2): 100 mg via ORAL
  Filled 2022-05-23 (×2): qty 2

## 2022-05-23 MED ORDER — SODIUM CHLORIDE 0.9 % IV SOLN: 500.0000 mg | INTRAVENOUS | Status: DC

## 2022-05-23 MED ORDER — IPRATROPIUM-ALBUTEROL 0.5-2.5 (3) MG/3ML IN SOLN
3.0000 mL | Freq: Once | RESPIRATORY_TRACT | Status: AC
  Administered 2022-05-23: 3 mL via RESPIRATORY_TRACT
  Filled 2022-05-23: qty 3

## 2022-05-23 MED ORDER — DM-GUAIFENESIN ER 30-600 MG PO TB12
2.0000 | ORAL_TABLET | Freq: Two times a day (BID) | ORAL | Status: DC | PRN
Start: 2022-05-23 — End: 2022-05-24
  Administered 2022-05-24: 2 via ORAL
  Filled 2022-05-23: qty 2

## 2022-05-23 MED ORDER — VANCOMYCIN HCL IN DEXTROSE 1-5 GM/200ML-% IV SOLN: 1000.0000 mg | INTRAVENOUS | Status: DC

## 2022-05-23 MED ORDER — VANCOMYCIN HCL IN DEXTROSE 1-5 GM/200ML-% IV SOLN: 1000.0000 mg | Freq: Once | INTRAVENOUS | Status: DC

## 2022-05-23 MED ORDER — ASCORBIC ACID 500 MG PO TABS
1000.0000 mg | ORAL_TABLET | Freq: Every day | ORAL | Status: DC
  Administered 2022-05-24: 1000 mg via ORAL
  Filled 2022-05-23: qty 2

## 2022-05-23 MED ORDER — ADULT MULTIVITAMIN W/MINERALS CH
1.0000 | ORAL_TABLET | Freq: Every day | ORAL | Status: DC
  Administered 2022-05-24: 1 via ORAL
  Filled 2022-05-23: qty 1

## 2022-05-23 MED ORDER — ACETAMINOPHEN 325 MG PO TABS: 650.0000 mg | ORAL_TABLET | Freq: Four times a day (QID) | ORAL | Status: DC | PRN

## 2022-05-23 MED ORDER — DOCUSATE SODIUM 100 MG PO CAPS
100.0000 mg | ORAL_CAPSULE | Freq: Two times a day (BID) | ORAL | Status: DC | PRN
  Administered 2022-05-24: 100 mg via ORAL
  Filled 2022-05-23: qty 1

## 2022-05-23 MED ORDER — SODIUM CHLORIDE 0.9 % IV SOLN: 1.0000 g | INTRAVENOUS | Status: DC

## 2022-05-23 NOTE — Progress Notes (Signed)
Pharmacy Antibiotic Note  Michael Evans is a 81 y.o. male admitted on 05/23/2022 with  unknown source .  Pharmacy has been consulted for cefepime and vancomycin dosing.  Plan: Vancomycin 1250mg  IV loading dose then 1250 mg IV Q 24 hrs. Goal AUC 400-550. Expected AUC: 479 SCr used: 0.91 Cefepime 2gm IVq12h F/U cxs and clinical progress Monitor V/S, labs and levels as indicated   Height: 5\' 5"  (165.1 cm) Weight: 59.4 kg (131 lb) IBW/kg (Calculated) : 61.5  Temp (24hrs), Avg:100.5 F (38.1 C), Min:99.7 F (37.6 C), Max:101 F (38.3 C)  Recent Labs  Lab 05/23/22 1320 05/23/22 1333  WBC 23.6*  --   CREATININE 0.91  --   LATICACIDVEN  --  1.6    Estimated Creatinine Clearance: 51.7 mL/min (by C-G formula based on SCr of 0.91 mg/dL).    Allergies  Allergen Reactions   Benzyl Alcohol Itching   Amlodipine Swelling    Swelling in feet   Aspirin Other (See Comments)    Upset stomach.   Other Hypertension    Hazelnuts   Penicillins Hives    Has patient had a PCN reaction causing immediate rash, facial/tongue/throat swelling, SOB or lightheadedness with hypotension: Yes Has patient had a PCN reaction causing severe rash involving mucus membranes or skin necrosis: No Has patient had a PCN reaction that required hospitalization No Has patient had a PCN reaction occurring within the last 10 years: No If all of the above answers are "NO", then may proceed with Cephalosporin use.    Sulfa Antibiotics Hives   Tylenol [Acetaminophen] Other (See Comments)    Pt reports he has had liver issues and was told not to take tylenol     Antimicrobials this admission: Vancomycin 6/23 >>  Cefepime 6/23 >>    Microbiology results: 6/23 BCx: pending 6/23 UCx: pending   MRSA PCR:   Thank you for allowing pharmacy to be a part of this patient's care.  Elder Cyphers, BS Pharm D, BCPS Clinical Pharmacist 05/23/2022 3:12 PM

## 2022-05-23 NOTE — Sepsis Progress Note (Signed)
Following per sepsis protocol   

## 2022-05-24 DIAGNOSIS — J441 Chronic obstructive pulmonary disease with (acute) exacerbation: Secondary | ICD-10-CM | POA: Diagnosis not present

## 2022-05-24 DIAGNOSIS — A419 Sepsis, unspecified organism: Secondary | ICD-10-CM | POA: Diagnosis not present

## 2022-05-24 DIAGNOSIS — B348 Other viral infections of unspecified site: Secondary | ICD-10-CM

## 2022-05-24 LAB — BASIC METABOLIC PANEL
Anion gap: 7 (ref 5–15)
BUN: 19 mg/dL (ref 8–23)
CO2: 26 mmol/L (ref 22–32)
Calcium: 9 mg/dL (ref 8.9–10.3)
Chloride: 108 mmol/L (ref 98–111)
Creatinine, Ser: 0.82 mg/dL (ref 0.61–1.24)
GFR, Estimated: >60 mL/min (ref >60)
Glucose, Bld: 106 mg/dL — ABNORMAL HIGH (ref 70–99)
Potassium: 3.9 mmol/L (ref 3.5–5.1)
Sodium: 141 mmol/L (ref 135–145)

## 2022-05-24 LAB — CBC
HCT: 39 % (ref 39.0–52.0)
Hemoglobin: 12.5 g/dL — ABNORMAL LOW (ref 13.0–17.0)
MCH: 29.1 pg (ref 26.0–34.0)
MCHC: 32.1 g/dL (ref 30.0–36.0)
MCV: 90.7 fL (ref 80.0–100.0)
Platelets: 227 10*3/uL (ref 150–400)
RBC: 4.3 MIL/uL (ref 4.22–5.81)
RDW: 13.3 % (ref 11.5–15.5)
WBC: 15.4 10*3/uL — ABNORMAL HIGH (ref 4.0–10.5)
nRBC: 0 % (ref 0.0–0.2)

## 2022-05-24 LAB — STREP PNEUMONIAE URINARY ANTIGEN: Strep Pneumo Urinary Antigen: NEGATIVE

## 2022-05-24 LAB — HEMOGLOBIN A1C
Hgb A1c MFr Bld: 6.5 % — ABNORMAL HIGH (ref 4.8–5.6)
Mean Plasma Glucose: 139.85 mg/dL

## 2022-05-24 LAB — GLUCOSE, CAPILLARY
Glucose-Capillary: 116 mg/dL — ABNORMAL HIGH (ref 70–99)
Glucose-Capillary: 145 mg/dL — ABNORMAL HIGH (ref 70–99)

## 2022-05-24 LAB — MAGNESIUM: Magnesium: 2 mg/dL (ref 1.7–2.4)

## 2022-05-24 MED ORDER — HYDRALAZINE HCL 20 MG/ML IJ SOLN: 10.0000 mg | INTRAMUSCULAR | Status: DC | PRN

## 2022-05-24 MED ORDER — AZITHROMYCIN 250 MG PO TABS: 250.0000 mg | ORAL_TABLET | Freq: Every day | ORAL | 0 refills | Status: AC

## 2022-05-24 MED ORDER — IPRATROPIUM-ALBUTEROL 0.5-2.5 (3) MG/3ML IN SOLN: 3.0000 mL | Freq: Four times a day (QID) | RESPIRATORY_TRACT | Status: DC

## 2022-05-24 MED ORDER — METOPROLOL TARTRATE 5 MG/5ML IV SOLN: 5.0000 mg | INTRAVENOUS | Status: DC | PRN

## 2022-05-24 MED ORDER — TRAZODONE HCL 50 MG PO TABS: 50.0000 mg | ORAL_TABLET | Freq: Every evening | ORAL | Status: DC | PRN

## 2022-05-25 LAB — URINE CULTURE: Culture: 50000 — AB

## 2022-05-26 ENCOUNTER — Other Ambulatory Visit: Payer: Self-pay | Admitting: Family Medicine

## 2022-05-26 ENCOUNTER — Other Ambulatory Visit: Payer: Self-pay

## 2022-05-26 MED ORDER — NITROFURANTOIN MONOHYD MACRO 100 MG PO CAPS: 100.0000 mg | ORAL_CAPSULE | Freq: Two times a day (BID) | ORAL | 0 refills | Status: DC

## 2022-05-27 LAB — LEGIONELLA PNEUMOPHILA SEROGP 1 UR AG: L. pneumophila Serogp 1 Ur Ag: NEGATIVE

## 2022-05-28 LAB — CULTURE, BLOOD (ROUTINE X 2): Culture: NO GROWTH

## 2022-05-29 ENCOUNTER — Telehealth: Payer: Self-pay

## 2022-05-29 LAB — CULTURE, BLOOD (ROUTINE X 2): Culture: NO GROWTH

## 2022-05-29 NOTE — Telephone Encounter (Signed)
Pt came into office stating that the company that he currently gets his oxygen from will be closing soon and will not supply his oxygen. Pt states that he will need a new prescription sent to another oxygen supply company. Please advise.  Cb#: Jones Broom (pt's sister) [phone]

## 2022-05-30 NOTE — Telephone Encounter (Signed)
Called spoke w/pt's sister, told that for pt or herself to check with pt's insurance and asked which DME is in their network, so we can send the Rx to them. Per pt's sister will find out and let us know.

## 2022-05-30 NOTE — Telephone Encounter (Addendum)
Spoke with pt's sister, pt's current HH for pt's oxygen supply no longer in pt's insurance network. Request of new Rx Oxygen to be fax over to [facility] at [phone]  Pls advice

## 2022-06-10 ENCOUNTER — Telehealth: Payer: Self-pay

## 2022-06-10 NOTE — Telephone Encounter (Signed)
Pt came in to office to provide the address where he would like for his oxygen to be sent to.    Michael Evans [address] [place], [address]  Please call if any questions [phone]

## 2022-06-11 NOTE — Progress Notes (Signed)
Enter in error, pls disregard.

## 2022-06-12 DIAGNOSIS — H25812 Combined forms of age-related cataract, left eye: Secondary | ICD-10-CM | POA: Diagnosis not present

## 2022-06-12 NOTE — Telephone Encounter (Signed)
Spoke with pt's sister, pt is coming in on [place]. 06/16/22 for a O2 saturation test for DME-Oxygen supply

## 2022-06-16 ENCOUNTER — Ambulatory Visit: Payer: Medicaid Other

## 2022-06-16 ENCOUNTER — Encounter (HOSPITAL_COMMUNITY)
Admission: RE | Admit: 2022-06-16 | Discharge: 2022-06-16 | Disposition: A | Discharge status: Home / Self Care | Payer: Medicaid Other | Source: Ambulatory Visit | Attending: Ophthalmology | Admitting: Ophthalmology

## 2022-06-17 NOTE — H&P (Signed)
Surgical History & Physical  Patient Name: Michael Evans DOB: 09-17-1941  Surgery: Cataract extraction with intraocular lens implant phacoemulsification; Left Eye  Surgeon: Baruch Goldmann MD Surgery Date:  06-20-22 Pre-Op Date:  06-16-22  HPI: A 48 Yr. old male patient The patient is here for a cataract evaluation of the left eye. Patient was referred from Dr. Jorja Loa. The patient's vision is blurry. The complaint is associated with difficulty reading small print on medicine bottles, writing checks or filling out forms, and experiencing glare on bright sunny days. Patient is not taking medications. This is negatively affecting the patient's quality of life and the patient is unable to function adequately in life with the current level of vision. HPI was performed by Baruch Goldmann .  Medical History: Cataracts CE IOL OD Headache, Sinus  Review of Systems Allergic/Immunologic Seasonal Allergies All recorded systems are negative except as noted above.  Social   Never Smoked  Medication  None  Sx/Procedures Cataract Surgery,  Back Surgery, Cyst removal, Appendectomy,   Drug Allergies  Peniciline, Sulfa (sulfonamide antibiotics), Aspirine,   History & Physical: Heent: Cataract, left eye NECK: supple without bruits LUNGS: lungs clear to auscultation CV: regular rate and rhythm Abdomen: soft and non-tender Impression & Plan: Assessment: 1.  NUCLEAR SCLEROSIS AGE RELATED; Left Eye (H25.12) 2.  Age-related Macular Degeneration, DRY; Both Eyes Early (H35.3131) 3.  BLEPHARITIS; Right Upper Lid, Right Lower Lid, Left Upper Lid, Left Lower Lid (H01.001, H01.002,H01.004,H01.005) 4.  Epiretinal Membrane; Right Eye (H35.371)  Plan: 1.  Cataract accounts for the patient's decreased vision. This visual impairment is not correctable with a tolerable change in glasses or contact lenses. Cataract surgery with an implantation of a new lens should significantly improve the visual and  functional status of the patient. Discussed all risks, benefits, alternatives, and potential complications. Discussed the procedures and recovery. Patient desires to have surgery. A-scan ordered and performed today for intra-ocular lens calculations. The surgery will be performed in order to improve vision for driving, reading, and for eye examinations. Recommend phacoemulsification with intra-ocular lens. Recommend Dextenza for post-operative pain and inflammation. Left Eye. Surgery required to correct imbalance of vision. Dilates poorly - shugarcaine by protocol. Malyugin Ring. Goal +1.00 for balanced vision. Standard Lens - with ARMD, do not recommend using multifocal IOL in OS.  2.  Mild drusen OU>  3.  Recommend regular lid cleaning  4.  OCT macula today. Moderate. Observe for now.

## 2022-06-20 ENCOUNTER — Encounter (HOSPITAL_COMMUNITY)
Admission: RE | Disposition: A | Discharge status: Home / Self Care | Payer: Self-pay | Source: Home / Self Care | Attending: Ophthalmology

## 2022-06-20 ENCOUNTER — Encounter (HOSPITAL_COMMUNITY): Payer: Self-pay | Admitting: Ophthalmology

## 2022-06-20 ENCOUNTER — Ambulatory Visit (HOSPITAL_BASED_OUTPATIENT_CLINIC_OR_DEPARTMENT_OTHER): Payer: Medicare Other | Admitting: Anesthesiology

## 2022-06-20 ENCOUNTER — Ambulatory Visit (HOSPITAL_COMMUNITY): Payer: Medicare Other | Admitting: Anesthesiology

## 2022-06-20 ENCOUNTER — Ambulatory Visit (HOSPITAL_COMMUNITY)
Admission: RE | Admit: 2022-06-20 | Discharge: 2022-06-20 | Disposition: A | Discharge status: Home / Self Care | Payer: Medicare Other | Attending: Ophthalmology | Admitting: Ophthalmology

## 2022-06-20 DIAGNOSIS — I10 Essential (primary) hypertension: Secondary | ICD-10-CM | POA: Diagnosis not present

## 2022-06-20 DIAGNOSIS — H25812 Combined forms of age-related cataract, left eye: Secondary | ICD-10-CM

## 2022-06-20 DIAGNOSIS — M1990 Unspecified osteoarthritis, unspecified site: Secondary | ICD-10-CM | POA: Diagnosis not present

## 2022-06-20 DIAGNOSIS — Z9981 Dependence on supplemental oxygen: Secondary | ICD-10-CM | POA: Insufficient documentation

## 2022-06-20 DIAGNOSIS — J449 Chronic obstructive pulmonary disease, unspecified: Secondary | ICD-10-CM | POA: Insufficient documentation

## 2022-06-20 DIAGNOSIS — H259 Unspecified age-related cataract: Secondary | ICD-10-CM

## 2022-06-20 DIAGNOSIS — Z87891 Personal history of nicotine dependence: Secondary | ICD-10-CM | POA: Insufficient documentation

## 2022-06-20 DIAGNOSIS — Z79899 Other long term (current) drug therapy: Secondary | ICD-10-CM | POA: Diagnosis not present

## 2022-06-20 DIAGNOSIS — K219 Gastro-esophageal reflux disease without esophagitis: Secondary | ICD-10-CM | POA: Insufficient documentation

## 2022-06-20 DIAGNOSIS — H269 Unspecified cataract: Secondary | ICD-10-CM

## 2022-06-20 HISTORY — PX: CATARACT EXTRACTION W/PHACO: SHX586

## 2022-06-20 SURGERY — PHACOEMULSIFICATION, CATARACT, WITH IOL INSERTION
Anesthesia: Monitor Anesthesia Care | Site: Eye | Laterality: Left

## 2022-06-20 MED ORDER — SODIUM HYALURONATE 23MG/ML IO SOSY
PREFILLED_SYRINGE | INTRAOCULAR | Status: DC | PRN
  Administered 2022-06-20: 0.6 mL via INTRAOCULAR

## 2022-06-20 MED ORDER — POVIDONE-IODINE 5 % OP SOLN
OPHTHALMIC | Status: DC | PRN
  Administered 2022-06-20: 1 via OPHTHALMIC

## 2022-06-20 MED ORDER — LIDOCAINE HCL 3.5 % OP GEL
1.0000 | Freq: Once | OPHTHALMIC | Status: AC
  Administered 2022-06-20: 1 via OPHTHALMIC

## 2022-06-20 MED ORDER — TETRACAINE HCL 0.5 % OP SOLN
1.0000 [drp] | OPHTHALMIC | Status: AC | PRN
  Administered 2022-06-20 (×3): 1 [drp] via OPHTHALMIC

## 2022-06-20 MED ORDER — BSS IO SOLN
INTRAOCULAR | Status: DC | PRN
  Administered 2022-06-20: 15 mL via INTRAOCULAR

## 2022-06-20 MED ORDER — PHENYLEPHRINE-KETOROLAC 1-0.3 % IO SOLN
INTRAOCULAR | Status: DC | PRN
  Administered 2022-06-20: 500 mL via OPHTHALMIC

## 2022-06-20 MED ORDER — PHENYLEPHRINE-KETOROLAC 1-0.3 % IO SOLN
INTRAOCULAR | Status: AC
  Filled 2022-06-20: qty 4

## 2022-06-20 MED ORDER — TRYPAN BLUE 0.06 % IO SOSY
PREFILLED_SYRINGE | INTRAOCULAR | Status: AC
  Filled 2022-06-20: qty 0.5

## 2022-06-20 MED ORDER — TROPICAMIDE 1 % OP SOLN
1.0000 [drp] | OPHTHALMIC | Status: AC | PRN
  Administered 2022-06-20 (×3): 1 [drp] via OPHTHALMIC

## 2022-06-20 MED ORDER — EPINEPHRINE PF 1 MG/ML IJ SOLN
INTRAMUSCULAR | Status: AC
  Filled 2022-06-20: qty 2

## 2022-06-20 MED ORDER — IPRATROPIUM-ALBUTEROL 0.5-2.5 (3) MG/3ML IN SOLN
RESPIRATORY_TRACT | Status: AC
  Filled 2022-06-20: qty 3

## 2022-06-20 MED ORDER — NEOMYCIN-POLYMYXIN-DEXAMETH 3.5-10000-0.1 OP SUSP
OPHTHALMIC | Status: DC | PRN
  Administered 2022-06-20: 1 [drp] via OPHTHALMIC

## 2022-06-20 MED ORDER — LIDOCAINE HCL (PF) 1 % IJ SOLN
INTRAOCULAR | Status: DC | PRN
  Administered 2022-06-20: 1 mL via OPHTHALMIC

## 2022-06-20 MED ORDER — STERILE WATER FOR IRRIGATION IR SOLN
Status: DC | PRN
  Administered 2022-06-20: 250 mL

## 2022-06-20 MED ORDER — PHENYLEPHRINE HCL 2.5 % OP SOLN
1.0000 [drp] | OPHTHALMIC | Status: AC | PRN
  Administered 2022-06-20 (×3): 1 [drp] via OPHTHALMIC

## 2022-06-20 MED ORDER — SODIUM HYALURONATE 10 MG/ML IO SOLUTION
PREFILLED_SYRINGE | INTRAOCULAR | Status: DC | PRN
  Administered 2022-06-20: 0.85 mL via INTRAOCULAR

## 2022-06-20 MED ORDER — IPRATROPIUM-ALBUTEROL 0.5-2.5 (3) MG/3ML IN SOLN
3.0000 mL | Freq: Once | RESPIRATORY_TRACT | Status: AC
  Administered 2022-06-20: 3 mL via RESPIRATORY_TRACT

## 2022-06-20 SURGICAL SUPPLY — 14 items
CATARACT SUITE SIGHTPATH (MISCELLANEOUS) ×2 IMPLANT
CLOTH BEACON ORANGE TIMEOUT ST (SAFETY) ×2 IMPLANT
EYE SHIELD UNIVERSAL CLEAR (GAUZE/BANDAGES/DRESSINGS) ×1 IMPLANT
FEE CATARACT SUITE SIGHTPATH (MISCELLANEOUS) ×1 IMPLANT
GLOVE BIOGEL PI IND STRL 7.0 (GLOVE) ×2 IMPLANT
GLOVE BIOGEL PI INDICATOR 7.0 (GLOVE) ×1
GLOVE SURG SS PI 7.0 STRL IVOR (GLOVE) ×1 IMPLANT
LENS IOL RAYNER 18.5 (Intraocular Lens) ×2 IMPLANT
LENS IOL RAYONE EMV 18.5 (Intraocular Lens) IMPLANT
PAD ARMBOARD 7.5X6 YLW CONV (MISCELLANEOUS) ×2 IMPLANT
SYR TB 1ML LL NO SAFETY (SYRINGE) ×2 IMPLANT
TAPE SURG TRANSPORE 1 IN (GAUZE/BANDAGES/DRESSINGS) IMPLANT
TAPE SURGICAL TRANSPORE 1 IN (GAUZE/BANDAGES/DRESSINGS) ×2
WATER STERILE IRR 250ML POUR (IV SOLUTION) ×2 IMPLANT

## 2022-06-20 NOTE — Discharge Instructions (Signed)
Please discharge patient when stable, will follow up today with Dr. Khalila Buechner at the [facility] office immediately following discharge.  Leave shield in place until visit.  All paperwork with discharge instructions will be given at the office.  [facility] Address:  [address]  [place], [place] [address]  

## 2022-06-20 NOTE — Transfer of Care (Signed)
Immediate Anesthesia Transfer of Care Note  Patient: Michael Evans  Procedure(s) Performed: CATARACT EXTRACTION PHACO AND INTRAOCULAR LENS PLACEMENT (IOC) (Left: Eye)  Patient Location: Short Stay  Anesthesia Type:MAC  Level of Consciousness: awake, alert  and oriented  Airway & Oxygen Therapy: Patient Spontanous Breathing  Post-op Assessment: Report given to RN and Post -op Vital signs reviewed and stable  Post vital signs: Reviewed and stable  Last Vitals:  Vitals Value Taken Time  BP    Temp    Pulse    Resp    SpO2      Last Pain:  Vitals:   06/20/22 1103  TempSrc: Oral  PainSc: 0-No pain         Complications: No notable events documented.

## 2022-06-20 NOTE — Anesthesia Postprocedure Evaluation (Signed)
Anesthesia Post Note  Patient: Michael Evans  Procedure(s) Performed: CATARACT EXTRACTION PHACO AND INTRAOCULAR LENS PLACEMENT (IOC) (Left: Eye)  Patient location during evaluation: PACU Anesthesia Type: MAC Level of consciousness: awake and alert and oriented Pain management: pain level controlled Vital Signs Assessment: post-procedure vital signs reviewed and stable Respiratory status: spontaneous breathing, nonlabored ventilation, respiratory function stable and patient connected to nasal cannula oxygen Cardiovascular status: stable and blood pressure returned to baseline Postop Assessment: no apparent nausea or vomiting Anesthetic complications: no   No notable events documented.   Last Vitals:  Vitals:   06/20/22 1123 06/20/22 1157  BP: 139/66 140/71  Pulse:  67  Resp:  20  Temp:  36.9 C  SpO2:  96%    Last Pain:  Vitals:   06/20/22 1157  TempSrc: Oral  PainSc: 0-No pain                 Aedan Geimer C Nancyjo Givhan

## 2022-06-20 NOTE — Op Note (Signed)
Date of procedure: 06/20/22  Pre-operative diagnosis: Visually significant age-related combined cataract, Left Eye (H25.812)  Post-operative diagnosis: Visually significant age-related combined cataract, Left Eye (H25.812)  Procedure: Removal of cataract via phacoemulsification and insertion of intra-ocular lens Rayner [id number] +18.5D into the capsular bag of the Left Eye  Attending surgeon: Gerda Diss. Mateja Dier, MD, MA  Anesthesia: MAC, Topical Akten  Complications: None  Estimated Blood Loss: <82m (minimal)  Specimens: None  Implants: As above  Indications:  Visually significant age-related cataract, Left Eye  Procedure:  The patient was seen and identified in the pre-operative area. The operative eye was identified and dilated.  The operative eye was marked.  Topical anesthesia was administered to the operative eye.     The patient was then to the operative suite and placed in the supine position.  A timeout was performed confirming the patient, procedure to be performed, and all other relevant information.   The patient's face was prepped and draped in the usual fashion for intra-ocular surgery.  A lid speculum was placed into the operative eye and the surgical microscope moved into place and focused.  An inferotemporal paracentesis was created using a 20 gauge paracentesis blade.  Shugarcaine was injected into the anterior chamber.  Viscoelastic was injected into the anterior chamber.  A temporal clear-corneal main wound incision was created using a 2.485mmicrokeratome.  A continuous curvilinear capsulorrhexis was initiated using an irrigating cystitome and completed using capsulorrhexis forceps.  Hydrodissection and hydrodeliniation were performed.  Viscoelastic was injected into the anterior chamber.  A phacoemulsification handpiece and a chopper as a second instrument were used to remove the nucleus and epinucleus. The irrigation/aspiration handpiece was used to remove any remaining  cortical material.   The capsular bag was reinflated with viscoelastic, checked, and found to be intact.  The intraocular lens was inserted into the capsular bag.  The irrigation/aspiration handpiece was used to remove any remaining viscoelastic.  The clear corneal wound and paracentesis wounds were then hydrated and checked with Weck-Cels to be watertight. The lid-speculum was removed.  The drape was removed.  The patient's face was cleaned with a wet and dry 4x4.    A clear shield was taped over the eye. The patient was taken to the post-operative care unit in good condition, having tolerated the procedure well.  Post-Op Instructions: The patient will [facility] day post-operative evaluation and will receive all other orders and instructions.

## 2022-06-20 NOTE — Anesthesia Preprocedure Evaluation (Signed)
Anesthesia Evaluation  Patient identified by MRN, date of birth, ID band Patient awake    Reviewed: Allergy & Precautions, NPO status , Patient's Chart, lab work & pertinent test results  Airway Mallampati: II  TM Distance: >3 FB Neck ROM: Full    Dental  (+) Dental Advisory Given, Missing   Pulmonary shortness of breath and Long-Term Oxygen Therapy, sleep apnea and Oxygen sleep apnea , COPD,  oxygen dependent, former smoker,    Pulmonary exam normal breath sounds clear to auscultation       Cardiovascular Exercise Tolerance: Poor hypertension, Pt. on medications Normal cardiovascular exam Rhythm:Regular Rate:Normal     Neuro/Psych negative neurological ROS  negative psych ROS   GI/Hepatic Neg liver ROS, GERD  Medicated and Controlled,  Endo/Other  negative endocrine ROS  Renal/GU negative Renal ROS  negative genitourinary   Musculoskeletal  (+) Arthritis , Osteoarthritis,    Abdominal   Peds negative pediatric ROS (+)  Hematology negative hematology ROS (+)   Anesthesia Other Findings   Reproductive/Obstetrics negative OB ROS                           Anesthesia Physical Anesthesia Plan  ASA: 4  Anesthesia Plan: MAC   Post-op Pain Management: Minimal or no pain anticipated   Induction:   PONV Risk Score and Plan:   Airway Management Planned: Nasal Cannula and Natural Airway  Additional Equipment:   Intra-op Plan:   Post-operative Plan:   Informed Consent: I have reviewed the patients History and Physical, chart, labs and discussed the procedure including the risks, benefits and alternatives for the proposed anesthesia with the patient or authorized representative who has indicated his/her understanding and acceptance.     Dental advisory given  Plan Discussed with: CRNA and Surgeon  Anesthesia Plan Comments:        Anesthesia Quick Evaluation

## 2022-06-20 NOTE — Interval H&P Note (Signed)
History and Physical Interval Note:  06/20/2022 11:28 AM  Michael Evans  has presented today for surgery, with the diagnosis of nuclear sclerosis age related cataract; left.  The various methods of treatment have been discussed with the patient and family. After consideration of risks, benefits and other options for treatment, the patient has consented to  Procedure(s) with comments: CATARACT EXTRACTION PHACO AND INTRAOCULAR LENS PLACEMENT ([place]) (Left) - left as a surgical intervention.  The patient's history has been reviewed, patient examined, no change in status, stable for surgery.  I have reviewed the patient's chart and labs.  Questions were answered to the patient's satisfaction.     Baruch Goldmann

## 2022-06-20 NOTE — Anesthesia Procedure Notes (Signed)
Procedure Name: MAC Date/Time: 06/20/2022 11:37 AM  Performed by: Orlie Dakin, CRNAPre-anesthesia Checklist: Patient identified, Emergency Drugs available, Suction available and Patient being monitored Patient Re-evaluated:Patient Re-evaluated prior to induction Oxygen Delivery Method: Nasal cannula Placement Confirmation: positive ETCO2

## 2022-06-23 ENCOUNTER — Encounter (HOSPITAL_COMMUNITY): Payer: Self-pay | Admitting: Ophthalmology

## 2022-06-29 ENCOUNTER — Encounter (HOSPITAL_COMMUNITY): Payer: Self-pay | Admitting: Emergency Medicine

## 2022-06-29 ENCOUNTER — Other Ambulatory Visit: Payer: Self-pay

## 2022-06-29 ENCOUNTER — Emergency Department (HOSPITAL_COMMUNITY)
Admission: EM | Admit: 2022-06-29 | Discharge: 2022-06-29 | Disposition: A | Discharge status: Home / Self Care | Payer: Medicare Other | Attending: Emergency Medicine | Admitting: Emergency Medicine

## 2022-06-29 DIAGNOSIS — Z79899 Other long term (current) drug therapy: Secondary | ICD-10-CM | POA: Diagnosis not present

## 2022-06-29 DIAGNOSIS — Z87891 Personal history of nicotine dependence: Secondary | ICD-10-CM | POA: Insufficient documentation

## 2022-06-29 DIAGNOSIS — I10 Essential (primary) hypertension: Secondary | ICD-10-CM | POA: Diagnosis not present

## 2022-06-29 DIAGNOSIS — R300 Dysuria: Secondary | ICD-10-CM | POA: Insufficient documentation

## 2022-06-29 DIAGNOSIS — J449 Chronic obstructive pulmonary disease, unspecified: Secondary | ICD-10-CM | POA: Insufficient documentation

## 2022-06-29 DIAGNOSIS — R1030 Lower abdominal pain, unspecified: Secondary | ICD-10-CM | POA: Diagnosis present

## 2022-06-29 DIAGNOSIS — N400 Enlarged prostate without lower urinary tract symptoms: Secondary | ICD-10-CM | POA: Diagnosis not present

## 2022-06-29 LAB — COMPREHENSIVE METABOLIC PANEL
ALT: 21 U/L (ref 0–44)
AST: 30 U/L (ref 15–41)
Albumin: 3.7 g/dL (ref 3.5–5.0)
Alkaline Phosphatase: 48 U/L (ref 38–126)
Anion gap: 7 (ref 5–15)
BUN: 27 mg/dL — ABNORMAL HIGH (ref 8–23)
CO2: 26 mmol/L (ref 22–32)
Calcium: 9.5 mg/dL (ref 8.9–10.3)
Chloride: 102 mmol/L (ref 98–111)
Creatinine, Ser: 0.89 mg/dL (ref 0.61–1.24)
GFR, Estimated: >60 mL/min (ref >60)
Glucose, Bld: 257 mg/dL — ABNORMAL HIGH (ref 70–99)
Potassium: 4.6 mmol/L (ref 3.5–5.1)
Sodium: 135 mmol/L (ref 135–145)
Total Bilirubin: 0.7 mg/dL (ref 0.3–1.2)
Total Protein: 6.6 g/dL (ref 6.5–8.1)

## 2022-06-29 LAB — CBC WITH DIFFERENTIAL/PLATELET
Abs Immature Granulocytes: 0.03 10*3/uL (ref 0.00–0.07)
Basophils Absolute: 0 10*3/uL (ref 0.0–0.1)
Basophils Relative: 0 %
Eosinophils Absolute: 0 10*3/uL (ref 0.0–0.5)
Eosinophils Relative: 0 %
HCT: 44.7 % (ref 39.0–52.0)
Hemoglobin: 14.9 g/dL (ref 13.0–17.0)
Immature Granulocytes: 0 %
Lymphocytes Relative: 10 %
Lymphs Abs: 0.9 10*3/uL (ref 0.7–4.0)
MCH: 29.5 pg (ref 26.0–34.0)
MCHC: 33.3 g/dL (ref 30.0–36.0)
MCV: 88.5 fL (ref 80.0–100.0)
Monocytes Absolute: 0.4 10*3/uL (ref 0.1–1.0)
Monocytes Relative: 5 %
Neutro Abs: 7.1 10*3/uL (ref 1.7–7.7)
Neutrophils Relative %: 85 %
Platelets: 208 10*3/uL (ref 150–400)
RBC: 5.05 MIL/uL (ref 4.22–5.81)
RDW: 13.6 % (ref 11.5–15.5)
WBC: 8.5 10*3/uL (ref 4.0–10.5)
nRBC: 0 % (ref 0.0–0.2)

## 2022-06-29 LAB — URINALYSIS, ROUTINE W REFLEX MICROSCOPIC
Bacteria, UA: NONE SEEN
Bilirubin Urine: NEGATIVE
Glucose, UA: >=500 mg/dL — AB
Hgb urine dipstick: NEGATIVE
Ketones, ur: NEGATIVE mg/dL
Leukocytes,Ua: NEGATIVE
Nitrite: NEGATIVE
Protein, ur: NEGATIVE mg/dL
Specific Gravity, Urine: 1.011 (ref 1.005–1.030)
pH: 7 (ref 5.0–8.0)

## 2022-06-29 LAB — LIPASE, BLOOD: Lipase: 28 U/L (ref 11–51)

## 2022-06-29 MED ORDER — CIPROFLOXACIN HCL 500 MG PO TABS: 500.0000 mg | ORAL_TABLET | Freq: Two times a day (BID) | ORAL | 0 refills | Status: AC

## 2022-06-29 MED ORDER — LACTATED RINGERS IV BOLUS
1000.0000 mL | Freq: Once | INTRAVENOUS | Status: AC
  Administered 2022-06-29: 1000 mL via INTRAVENOUS

## 2022-06-29 NOTE — ED Triage Notes (Addendum)
Patient c/o lower abd pain that started this morning denies any nausea, vomiting, diarrhea, fevers, or urinary symptoms. Per patient last BM this morning normal, no blood noted. Patient does reports recent inguinal hernia repair x3 weeks ago. No signs of infection at incision sites.

## 2022-06-29 NOTE — ED Provider Notes (Signed)
[facility] EMERGENCY DEPARTMENT Provider Note   CSN: [id number] Arrival date & time: 06/29/22  1329     History  Chief Complaint  Patient presents with   Abdominal Pain    Michael Evans is a 81 y.o. male with history of hypertension, COPD who presents to the emergency department for evaluation of lower abdominal pain that started this morning.  Patient states that he has had burning with urination and blood noted in the urine for a few months.  He has not had this evaluated until the pain started this morning.  Pain is aching.  He endorses normal bowel movements without blood noted.  He denies nausea, vomiting, diarrhea.  Patient had right inguinal hernia repair done in April 2023 without any complications.  Denies fever, chills, changes in mental status.  Patient lives at home with his sister.  Patient had a urinalysis done in June 2023 which was without evidence of infection.  Abdominal Pain Associated symptoms: dysuria and hematuria   Associated symptoms: no diarrhea, no fever, no nausea and no vomiting        Home Medications Prior to Admission medications   Medication Sig Start Date End Date Taking? Authorizing Provider  albuterol (VENTOLIN HFA) 108 (90 Base) MCG/ACT inhaler INHALE 1-2 PUFFS BY MOUTH EVERY 6 HOURS AS NEEDED FOR WHEEZE OR SHORTNESS OF BREATH Patient taking differently: Inhale 2 puffs into the lungs every 6 (six) hours as needed for wheezing or shortness of breath. 03/20/22  Yes [name], MD  Ascorbic Acid (VITAMIN C) 1000 MG tablet Take 1,000 mg by mouth daily.   Yes [provider]  Budeson-Glycopyrrol-Formoterol (BREZTRI AEROSPHERE) 160-9-4.8 MCG/ACT AERO Inhale 2 puffs into the lungs in the morning and at bedtime.   Yes [provider]  cholecalciferol (VITAMIN D) 25 MCG (1000 UNIT) tablet Take 3,000 Units by mouth daily.   Yes [provider]  ciprofloxacin (CIPRO) 500 MG tablet Take 1 tablet (500 mg total) by mouth every 12  (twelve) hours for 14 days. 06/29/22 07/13/22 Yes Tonye Pearson, PA-C  cyanocobalamin 2000 MCG tablet Take 1,000 mcg by mouth daily.   Yes [provider]  Multiple Vitamin (MULTIVITAMIN) tablet Take 1 tablet by mouth daily. Men's health   Yes [provider]  Omega-3 Fatty Acids (FISH OIL CONCENTRATE PO) Take 1,000 mg by mouth daily.   Yes [provider]  pyridoxine (B-6) 100 MG tablet Take 100 mg by mouth daily.   Yes [provider]  Nebulizer MISC 1 each by Does not apply route daily as needed. PLEASE PROVIDE NEBULIZER KIT TO INCLUDE TUBING/MASK/MOUTHPIECE 10/04/21   [name], MD  nitrofurantoin, macrocrystal-monohydrate, (MACROBID) 100 MG capsule Take 1 capsule (100 mg total) by mouth 2 (two) times daily. Patient not taking: Reported on 06/29/2022 05/26/22   [name], MD  oxyCODONE (OXY IR/ROXICODONE) 5 MG immediate release tablet Take 1 tablet (5 mg total) by mouth every 4 (four) hours as needed for severe pain or breakthrough pain. Patient not taking: Reported on 06/29/2022 03/20/22   [name], MD      Allergies    Benzyl alcohol, Amlodipine, Aspirin, Other, Penicillins, Sulfa antibiotics, and Tylenol [acetaminophen]    Review of Systems   Review of Systems  Constitutional:  Negative for fever.  Gastrointestinal:  Positive for abdominal pain. Negative for diarrhea, nausea and vomiting.  Genitourinary:  Positive for dysuria and hematuria.    Physical Exam Updated Vital Signs BP (!) 155/74  Pulse 65   Temp 98 F (36.7 C) (Oral)   Resp 14   Ht 5' 5"  (1.651 m)   Wt 61.2 kg   SpO2 94%   BMI 22.47 kg/m  Physical Exam Vitals and nursing note reviewed.  Constitutional:      General: He is not in acute distress.    Appearance: He is not ill-appearing.  HENT:     Head: Atraumatic.  Eyes:     Conjunctiva/sclera: Conjunctivae normal.  Cardiovascular:     Rate and Rhythm: Normal rate and regular rhythm.     Pulses:  Normal pulses.     Heart sounds: No murmur heard. Pulmonary:     Effort: Pulmonary effort is normal. No respiratory distress.     Breath sounds: Normal breath sounds.  Abdominal:     General: Abdomen is flat. There is no distension.     Palpations: Abdomen is soft.     Tenderness: There is abdominal tenderness in the suprapubic area. There is no right CVA tenderness or left CVA tenderness.     Comments: Tenderness in bandlike pattern across suprapubic region.  Negative CVA tenderness bilaterally  Genitourinary:    Prostate: Enlarged. Not tender.  Musculoskeletal:        General: Normal range of motion.     Cervical back: Normal range of motion.  Skin:    General: Skin is warm and dry.     Capillary Refill: Capillary refill takes less than 2 seconds.  Neurological:     General: No focal deficit present.     Mental Status: He is alert.  Psychiatric:        Mood and Affect: Mood normal.     ED Results / Procedures / Treatments   Labs (all labs ordered are listed, but only abnormal results are displayed) Labs Reviewed  COMPREHENSIVE METABOLIC PANEL - Abnormal; Notable for the following components:      Result Value   Glucose, Bld 257 (*)    BUN 27 (*)    All other components within normal limits  URINALYSIS, ROUTINE W REFLEX MICROSCOPIC - Abnormal; Notable for the following components:   APPearance HAZY (*)    Glucose, UA >=500 (*)    All other components within normal limits  URINE CULTURE  CBC WITH DIFFERENTIAL/PLATELET  LIPASE, BLOOD    EKG None  Radiology No results found.  Procedures Procedures    Medications Ordered in ED Medications  lactated ringers bolus 1,000 mL (0 mLs Intravenous Stopped 06/29/22 1549)    ED Course/ Medical Decision Making/ A&P                           Medical Decision Making Amount and/or Complexity of Data Reviewed Labs: ordered.  Risk Prescription drug management.   Social determinants of health:  Social History    Socioeconomic History   Marital status: Divorced    Spouse name: Not on file   Number of children: 3   Years of education: Not on file   Highest education level: Not on file  Occupational History   Occupation: Neurosurgeon: RETIRED  Tobacco Use   Smoking status: Former    Packs/day: 1.50    Years: 54.00    Total pack years: 81.00    Types: Cigarettes    Quit date: 12/01/2001    Years since quitting: 20.5   Smokeless tobacco: Never  Vaping Use   Vaping Use: Never used  Substance and Sexual Activity   Alcohol use: No   Drug use: No   Sexual activity: Not on file  Other Topics Concern   Not on file  Social History Narrative   Lives with sister.    Social Determinants of Health   Financial Resource Strain: Low Risk  (02/07/2022)   Overall Financial Resource Strain (CARDIA)    Difficulty of Paying Living Expenses: Not hard at all  Food Insecurity: No Food Insecurity (02/07/2022)   Hunger Vital Sign    Worried About Running Out of Food in the Last Year: Never true    Ran Out of Food in the Last Year: Never true  Transportation Needs: No Transportation Needs (02/07/2022)   PRAPARE - Hydrologist (Medical): No    Lack of Transportation (Non-Medical): No  Physical Activity: Insufficiently Active (02/07/2022)   Exercise Vital Sign    Days of Exercise per Week: 3 days    Minutes of Exercise per Session: 20 min  Stress: No Stress Concern Present (02/07/2022)   [place]    Feeling of Stress : Not at all  Social Connections: Socially Isolated (02/07/2022)   Social Connection and Isolation Panel [NHANES]    Frequency of Communication with Friends and Family: More than three times a week    Frequency of Social Gatherings with Friends and Family: More than three times a week    Attends Religious Services: Never    Marine scientist or Organizations: No    Attends Theatre manager Meetings: Never    Marital Status: Divorced  Human resources officer Violence: Not At Risk (02/07/2022)   Humiliation, Afraid, Rape, and Kick questionnaire    Fear of Current or Ex-Partner: No    Emotionally Abused: No    Physically Abused: No    Sexually Abused: No     Initial impression:  This patient presents to the ED for concern of suprapubic tenderness with dysuria and hematuria, this involves an extensive number of treatment options, and is a complaint that carries with it a high risk of complications and morbidity.   Differentials include UTI, nephrolithiasis, hernia, BPH, prostatitis.   Comorbidities affecting care:  COPD   Additional history obtained: Labs done one month ago -urine with mild leukocytes but no evidence of infection.  Patient was admitted at that time for sepsis secondary to community-acquired pneumonia.  Lab Tests  I Ordered, reviewed, and interpreted labs and EKG.  The pertinent results include:  CMP, CBC, lipase and UA without evidence of infection.   Medicines ordered and prescription drug management:  I ordered medication including: 1 L LR bolus   Reevaluation of the patient after these medicines showed that the patient improved I have reviewed the patients home medicines and have made adjustments as needed    ED Course/Re-evaluation: Presents in no acute distress and is nontoxic-appearing.  Vitals are without acute abnormality.  He has tenderness in a bandlike pattern across the suprapubic region.  Diminished lung sounds bilaterally.  I asked him if he would be able to produce a urine sample, and he says he does not think he would be able to at this time.  He agrees to in and out catheterization to obtain urine sample. We will run urinalysis along with basic labs.  RN notified me that patient has 150 mL of fluid within the bladder but that she was unable to perform in and out due to prostate  obstruction.  Plan to hydrate him with 1 L LR and  provide urinal.  After 1 to 2 hours, patient was able to express his bladder and urine sample was obtained.  There is no evidence of infection although there was large amounts of glucose.  I performed a prostate exam which was notable for enlarged prostate without tenderness.  I discussed the case with my attending, Dr. Roderic Palau who recommends treating for possible prostatitis and having him follow-up with a urologist. He has allergy to Bactrim so will discharge home with ciprofloxacin twice daily x2 weeks.  Urology referral was provided.  Patient expresses standing and is amenable to plan.  Disposition:  After consideration of the diagnostic results, physical exam, history and the patients response to treatment feel that the patent would benefit from discharge.   Enlarged prostate Dysuria: Plan and management as described above. Discharged home in good condition.  Final Clinical Impression(s) / ED Diagnoses Final diagnoses:  Enlarged prostate  Dysuria    Rx / DC Orders ED Discharge Orders          Ordered    ciprofloxacin (CIPRO) 500 MG tablet  Every 12 hours        06/29/22 1814              Rodena Piety 06/29/22 1819    Noemi Chapel, MD 06/30/22 1208

## 2022-06-29 NOTE — Discharge Instructions (Addendum)
Your labs today were overall reassuring and there is no evidence of urinary tract infection.  However, it does seem that your prostate is quite enlarged which can cause similar symptoms as a UTI and some discomfort.  I have sent you an antibiotic to treat for any underlying infection of your prostate, however you will need to follow-up with a urologist to have this managed long-term.  I have given you a referral to a urologist here in your discharge papers-please call their office tomorrow to schedule an appointment for follow-up.

## 2022-06-29 NOTE — ED Notes (Signed)
Attempted to in and out cath pt, catheter would not advance past prostate. PA made aware. Gave pt urinal and he stated he would attempt to provide sample once he felt the urge to urinate.

## 2022-06-29 NOTE — ED Notes (Signed)
Bladder scan showed approximately 155 ml urine

## 2022-06-30 LAB — URINE CULTURE: Culture: >=100000 — AB

## 2022-07-01 ENCOUNTER — Telehealth: Payer: Self-pay

## 2022-07-01 DIAGNOSIS — J441 Chronic obstructive pulmonary disease with (acute) exacerbation: Secondary | ICD-10-CM | POA: Diagnosis not present

## 2022-07-01 DIAGNOSIS — J449 Chronic obstructive pulmonary disease, unspecified: Secondary | ICD-10-CM | POA: Diagnosis not present

## 2022-07-01 NOTE — Telephone Encounter (Signed)
Post ED Visit - Positive Culture Follow-up  Culture report reviewed by antimicrobial stewardship pharmacist: [facility] Team '[]'$  Elenor Quinones, Pharm.D. '[x]'$  Heide Guile, Pharm.D., BCPS AQ-ID '[]'$  Parks Neptune, Pharm.D., BCPS '[]'$  Alycia Rossetti, Pharm.D., BCPS '[]'$  [place], [place].D., BCPS, AAHIVP '[]'$  Legrand Como, Pharm.D., BCPS, AAHIVP '[]'$  Salome Arnt, PharmD, BCPS '[]'$  Johnnette Gourd, PharmD, BCPS '[]'$  Hughes Better, PharmD, BCPS '[]'$  Leeroy Cha, PharmD '[]'$  Laqueta Linden, PharmD, BCPS '[]'$  Albertina Parr, PharmD  [place] Team '[]'$  Leodis Sias, PharmD '[]'$  Lindell Spar, PharmD '[]'$  Royetta Asal, PharmD '[]'$  Graylin Shiver, Rph '[]'$  Rema Fendt) Glennon Mac, PharmD '[]'$  Arlyn Dunning, PharmD '[]'$  Netta Cedars, PharmD '[]'$  Dia Sitter, PharmD '[]'$  Leone Haven, PharmD '[]'$  Gretta Arab, PharmD '[]'$  Theodis Shove, PharmD '[]'$  Peggyann Juba, PharmD '[]'$  Reuel Boom, PharmD   Positive urine culture Treated with Ciprofloxacin, organism sensitive to the same and no further patient follow-up is required at this time.  Glennon Hamilton 07/01/2022, 9:56 AM

## 2022-07-08 ENCOUNTER — Telehealth: Payer: Self-pay

## 2022-07-08 NOTE — Telephone Encounter (Signed)
        Patient  visited [place] on 7/31  for Enlarged Prostate   Telephone encounter attempt :  1st  Patient refused conversation Pt seemed confused and could not hear well     [facility]  [phone] [address], [place], [place] [address] Phone: [phone] Email: [email or web address]

## 2022-07-14 DIAGNOSIS — R310 Gross hematuria: Secondary | ICD-10-CM | POA: Diagnosis not present

## 2022-07-14 DIAGNOSIS — R3912 Poor urinary stream: Secondary | ICD-10-CM | POA: Diagnosis not present

## 2022-07-28 ENCOUNTER — Ambulatory Visit (INDEPENDENT_AMBULATORY_CARE_PROVIDER_SITE_OTHER): Payer: Medicare Other | Admitting: Family Medicine

## 2022-07-28 ENCOUNTER — Ambulatory Visit (HOSPITAL_COMMUNITY)
Admission: RE | Admit: 2022-07-28 | Discharge: 2022-07-28 | Disposition: A | Discharge status: Home / Self Care | Payer: Medicare Other | Source: Ambulatory Visit | Attending: Family Medicine | Admitting: Family Medicine

## 2022-07-28 VITALS — BP 118/60 | HR 75 | Temp 97.5°F | Ht 65.0 in | Wt 126.0 lb

## 2022-07-28 DIAGNOSIS — R079 Chest pain, unspecified: Secondary | ICD-10-CM

## 2022-07-28 DIAGNOSIS — R059 Cough, unspecified: Secondary | ICD-10-CM | POA: Diagnosis not present

## 2022-07-28 MED ORDER — TRIAMCINOLONE ACETONIDE 0.1 % EX CREA: 1.0000 | TOPICAL_CREAM | Freq: Two times a day (BID) | CUTANEOUS | 0 refills | Status: DC

## 2022-07-28 NOTE — Progress Notes (Signed)
Subjective:    Patient ID: Michael Evans, male    DOB: 1941-08-02, 81 y.o.   MRN: [id number]  Patient has a history of severe COPD.  He presents today concerned about a hard nodule on his anterior neck directly in the midline.  On palpation, the patient is feeling the cricoid cartilage.  There is no fluctuance or abscess or lymphadenopathy or atypical mass.  I reassured the patient that this is a benign finding.  He is palpating the cricoid cartilage.  There is no evidence of any overlying skin erythema or infection.  However he does report some right-sided chest pain.  He points to the right of his sternum.  He states that it hurts when he coughs or to take a deep breath then.  He states that it feels like the last time he had pneumonia.  He does report a cough productive of white sputum.  He denies any fever or chills.  On examination today, the patient has markedly diminished breath sounds bilaterally and faint rhonchorous breath sounds bilaterally but this is his baseline.  Past Surgical History:  Procedure Laterality Date  . APPENDECTOMY    . BACK SURGERY    . CATARACT EXTRACTION W/PHACO  01/06/2013   Procedure: CATARACT EXTRACTION PHACO AND INTRAOCULAR LENS PLACEMENT (IOC);  Surgeon: Gemma Payor, MD;  Location: [facility];  Service: Ophthalmology;  Laterality: Right;  CDE: 22.17  . CATARACT EXTRACTION W/PHACO Left 06/20/2022   Procedure: CATARACT EXTRACTION PHACO AND INTRAOCULAR LENS PLACEMENT (IOC);  Surgeon: Fabio Pierce, MD;  Location: [facility];  Service: Ophthalmology;  Laterality: Left;  CDE 20.85  . INGUINAL HERNIA REPAIR Left 12/09/2021   Procedure: HERNIA REPAIR INGUINAL ADULT WITH MESH;  Surgeon: Lucretia Roers, MD;  Location: [facility];  Service: General;  Laterality: Left;  . INGUINAL HERNIA REPAIR Right 03/19/2022   Procedure: HERNIA REPAIR INGUINAL ADULT WITH MESH;  Surgeon: Lucretia Roers, MD;  Location: [facility];  Service: General;  Laterality: Right;   Current Outpatient  Medications on File Prior to Visit  Medication Sig Dispense Refill  . albuterol (VENTOLIN HFA) 108 (90 Base) MCG/ACT inhaler INHALE 1-2 PUFFS BY MOUTH EVERY 6 HOURS AS NEEDED FOR WHEEZE OR SHORTNESS OF BREATH (Patient taking differently: Inhale 2 puffs into the lungs every 6 (six) hours as needed for wheezing or shortness of breath.) 6.7 each 3  . Ascorbic Acid (VITAMIN C) 1000 MG tablet Take 1,000 mg by mouth daily.    . Budeson-Glycopyrrol-Formoterol (BREZTRI AEROSPHERE) 160-9-4.8 MCG/ACT AERO Inhale 2 puffs into the lungs in the morning and at bedtime.    . cholecalciferol (VITAMIN D) 25 MCG (1000 UNIT) tablet Take 3,000 Units by mouth daily.    . cyanocobalamin 2000 MCG tablet Take 1,000 mcg by mouth daily.    . Multiple Vitamin (MULTIVITAMIN) tablet Take 1 tablet by mouth daily. Men's health    . Nebulizer MISC 1 each by Does not apply route daily as needed. PLEASE PROVIDE NEBULIZER KIT TO INCLUDE TUBING/MASK/MOUTHPIECE 1 each 3  . nitrofurantoin, macrocrystal-monohydrate, (MACROBID) 100 MG capsule Take 1 capsule (100 mg total) by mouth 2 (two) times daily. 14 capsule 0  . Omega-3 Fatty Acids (FISH OIL CONCENTRATE PO) Take 1,000 mg by mouth daily.    Marland Kitchen oxyCODONE (OXY IR/ROXICODONE) 5 MG immediate release tablet Take 1 tablet (5 mg total) by mouth every 4 (four) hours as needed for severe pain or breakthrough pain. 20 tablet 0  . pyridoxine (B-6) 100 MG tablet Take 100  mg by mouth daily.     No current facility-administered medications on file prior to visit.   Allergies  Allergen Reactions  . Benzyl Alcohol Itching  . Amlodipine Swelling    Swelling in feet  . Aspirin Other (See Comments)    Upset stomach.  . Other Hypertension    Hazelnuts  . Penicillins Hives    Has patient had a PCN reaction causing immediate rash, facial/tongue/throat swelling, SOB or lightheadedness with hypotension: Yes Has patient had a PCN reaction causing severe rash involving mucus membranes or skin  necrosis: No Has patient had a PCN reaction that required hospitalization No Has patient had a PCN reaction occurring within the last 10 years: No If all of the above answers are "NO", then may proceed with Cephalosporin use.   . Sulfa Antibiotics Hives  . Tylenol [Acetaminophen] Other (See Comments)    Pt reports he has had liver issues and was told not to take tylenol    Social History   Socioeconomic History  . Marital status: Divorced    Spouse name: Not on file  . Number of children: 3  . Years of education: Not on file  . Highest education level: Not on file  Occupational History  . Occupation: Neurosurgeon: RETIRED  Tobacco Use  . Smoking status: Former    Packs/day: 1.50    Years: 54.00    Total pack years: 81.00    Types: Cigarettes    Quit date: 12/01/2001    Years since quitting: 20.6  . Smokeless tobacco: Never  Vaping Use  . Vaping Use: Never used  Substance and Sexual Activity  . Alcohol use: No  . Drug use: No  . Sexual activity: Not on file  Other Topics Concern  . Not on file  Social History Narrative   Lives with sister.    Social Determinants of Health   Financial Resource Strain: Low Risk  (02/07/2022)   Overall Financial Resource Strain (CARDIA)   . Difficulty of Paying Living Expenses: Not hard at all  Food Insecurity: No Food Insecurity (02/07/2022)   Hunger Vital Sign   . Worried About Charity fundraiser in the Last Year: Never true   . Ran Out of Food in the Last Year: Never true  Transportation Needs: No Transportation Needs (02/07/2022)   PRAPARE - Transportation   . Lack of Transportation (Medical): No   . Lack of Transportation (Non-Medical): No  Physical Activity: Insufficiently Active (02/07/2022)   Exercise Vital Sign   . Days of Exercise per Week: 3 days   . Minutes of Exercise per Session: 20 min  Stress: No Stress Concern Present (02/07/2022)   [place]    . Feeling of Stress : Not at all  Social Connections: Socially Isolated (02/07/2022)   Social Connection and Isolation Panel [NHANES]   . Frequency of Communication with Friends and Family: More than three times a week   . Frequency of Social Gatherings with Friends and Family: More than three times a week   . Attends Religious Services: Never   . Active Member of Clubs or Organizations: No   . Attends Archivist Meetings: Never   . Marital Status: Divorced  Human resources officer Violence: Not At Risk (02/07/2022)   Humiliation, Afraid, Rape, and Kick questionnaire   . Fear of Current or Ex-Partner: No   . Emotionally Abused: No   . Physically Abused: No   .  Sexually Abused: No     Review of Systems  All other systems reviewed and are negative.      Objective:   Physical Exam Constitutional:      General: He is not in acute distress.    Appearance: Normal appearance. He is not ill-appearing or toxic-appearing.  Cardiovascular:     Rate and Rhythm: Regular rhythm. Tachycardia present.  Pulmonary:     Effort: Pulmonary effort is normal. Prolonged expiration present. No accessory muscle usage or respiratory distress.     Breath sounds: Decreased air movement present. Examination of the right-lower field reveals rhonchi. Examination of the left-lower field reveals rhonchi. Rhonchi present. No wheezing or rales.  Neurological:     Mental Status: He is alert.          Assessment & Plan:  Right-sided chest pain - Plan: DG Chest 2 View I believe the patient is likely dealing with pleurisy due to an intercostal muscle strain.  However his breath sounds are markedly abnormal.  Is difficult to determine which of these are his baseline which could indicate an underlying infection.  Therefore we will send him for a chest x-ray to evaluate for any early signs of community-acquired pneumonia.  I reassured the patient that the nodule in his neck is a normal finding and represents the  cricoid cartilage.

## 2022-07-31 ENCOUNTER — Ambulatory Visit (INDEPENDENT_AMBULATORY_CARE_PROVIDER_SITE_OTHER): Payer: Medicare Other | Admitting: Family Medicine

## 2022-07-31 VITALS — BP 130/70 | HR 82 | Temp 98.2°F | Wt 126.0 lb

## 2022-07-31 DIAGNOSIS — R1031 Right lower quadrant pain: Secondary | ICD-10-CM

## 2022-07-31 NOTE — Progress Notes (Signed)
Subjective:    Patient ID: Michael Evans, male    DOB: 08-21-1941, 81 y.o.   MRN: [id number] Patient had a right inguinal hernia repair in April of this year.  He states that ever since he had the surgery he has had tenderness in the right inguinal canal.  Today on examination the surgical site appears well-healed.  There is no seroma.  There is no fluctuance or erythema.  There is no visible bulge.  On Valsalva exam there is no palpable hernia.  He only develops the tenderness with heavy lifting or straining. Past Surgical History:  Procedure Laterality Date   APPENDECTOMY     BACK SURGERY     CATARACT EXTRACTION W/PHACO  01/06/2013   Procedure: CATARACT EXTRACTION PHACO AND INTRAOCULAR LENS PLACEMENT (IOC);  Surgeon: Tonny Branch, MD;  Location: [facility];  Service: Ophthalmology;  Laterality: Right;  CDE: 22.17   CATARACT EXTRACTION W/PHACO Left 06/20/2022   Procedure: CATARACT EXTRACTION PHACO AND INTRAOCULAR LENS PLACEMENT (IOC);  Surgeon: Baruch Goldmann, MD;  Location: [facility];  Service: Ophthalmology;  Laterality: Left;  CDE 20.85   INGUINAL HERNIA REPAIR Left 12/09/2021   Procedure: HERNIA REPAIR INGUINAL ADULT WITH MESH;  Surgeon: [name], MD;  Location: [facility];  Service: General;  Laterality: Left;   INGUINAL HERNIA REPAIR Right 03/19/2022   Procedure: HERNIA REPAIR INGUINAL ADULT WITH MESH;  Surgeon: [name], MD;  Location: [facility];  Service: General;  Laterality: Right;   Current Outpatient Medications on File Prior to Visit  Medication Sig Dispense Refill   albuterol (VENTOLIN HFA) 108 (90 Base) MCG/ACT inhaler INHALE 1-2 PUFFS BY MOUTH EVERY 6 HOURS AS NEEDED FOR WHEEZE OR SHORTNESS OF BREATH (Patient taking differently: Inhale 2 puffs into the lungs every 6 (six) hours as needed for wheezing or shortness of breath.) 6.7 each 3   Ascorbic Acid (VITAMIN C) 1000 MG tablet Take 1,000 mg by mouth daily.     Budeson-Glycopyrrol-Formoterol (BREZTRI AEROSPHERE) 160-9-4.8  MCG/ACT AERO Inhale 2 puffs into the lungs in the morning and at bedtime.     cholecalciferol (VITAMIN D) 25 MCG (1000 UNIT) tablet Take 3,000 Units by mouth daily.     cyanocobalamin 2000 MCG tablet Take 1,000 mcg by mouth daily.     Multiple Vitamin (MULTIVITAMIN) tablet Take 1 tablet by mouth daily. Men's health     Nebulizer MISC 1 each by Does not apply route daily as needed. PLEASE PROVIDE NEBULIZER KIT TO INCLUDE TUBING/MASK/MOUTHPIECE 1 each 3   nitrofurantoin, macrocrystal-monohydrate, (MACROBID) 100 MG capsule Take 1 capsule (100 mg total) by mouth 2 (two) times daily. 14 capsule 0   Omega-3 Fatty Acids (FISH OIL CONCENTRATE PO) Take 1,000 mg by mouth daily.     oxyCODONE (OXY IR/ROXICODONE) 5 MG immediate release tablet Take 1 tablet (5 mg total) by mouth every 4 (four) hours as needed for severe pain or breakthrough pain. 20 tablet 0   pyridoxine (B-6) 100 MG tablet Take 100 mg by mouth daily.     triamcinolone cream (KENALOG) 0.1 % Apply 1 Application topically 2 (two) times daily. 30 g 0   No current facility-administered medications on file prior to visit.   Allergies  Allergen Reactions   Benzyl Alcohol Itching   Amlodipine Swelling    Swelling in feet   Aspirin Other (See Comments)    Upset stomach.   Other Hypertension    Hazelnuts   Penicillins Hives    Has patient had a PCN  reaction causing immediate rash, facial/tongue/throat swelling, SOB or lightheadedness with hypotension: Yes Has patient had a PCN reaction causing severe rash involving mucus membranes or skin necrosis: No Has patient had a PCN reaction that required hospitalization No Has patient had a PCN reaction occurring within the last 10 years: No If all of the above answers are "NO", then may proceed with Cephalosporin use.    Sulfa Antibiotics Hives   Tylenol [Acetaminophen] Other (See Comments)    Pt reports he has had liver issues and was told not to take tylenol    Social History   Socioeconomic  History   Marital status: Divorced    Spouse name: Not on file   Number of children: 3   Years of education: Not on file   Highest education level: Not on file  Occupational History   Occupation: Neurosurgeon: RETIRED  Tobacco Use   Smoking status: Former    Packs/day: 1.50    Years: 54.00    Total pack years: 81.00    Types: Cigarettes    Quit date: 12/01/2001    Years since quitting: 20.6   Smokeless tobacco: Never  Vaping Use   Vaping Use: Never used  Substance and Sexual Activity   Alcohol use: No   Drug use: No   Sexual activity: Not on file  Other Topics Concern   Not on file  Social History Narrative   Lives with sister.    Social Determinants of Health   Financial Resource Strain: Low Risk  (02/07/2022)   Overall Financial Resource Strain (CARDIA)    Difficulty of Paying Living Expenses: Not hard at all  Food Insecurity: No Food Insecurity (02/07/2022)   Hunger Vital Sign    Worried About Running Out of Food in the Last Year: Never true    Ran Out of Food in the Last Year: Never true  Transportation Needs: No Transportation Needs (02/07/2022)   PRAPARE - Hydrologist (Medical): No    Lack of Transportation (Non-Medical): No  Physical Activity: Insufficiently Active (02/07/2022)   Exercise Vital Sign    Days of Exercise per Week: 3 days    Minutes of Exercise per Session: 20 min  Stress: No Stress Concern Present (02/07/2022)   [place]    Feeling of Stress : Not at all  Social Connections: Socially Isolated (02/07/2022)   Social Connection and Isolation Panel [NHANES]    Frequency of Communication with Friends and Family: More than three times a week    Frequency of Social Gatherings with Friends and Family: More than three times a week    Attends Religious Services: Never    Marine scientist or Organizations: No    Attends Archivist  Meetings: Never    Marital Status: Divorced  Human resources officer Violence: Not At Risk (02/07/2022)   Humiliation, Afraid, Rape, and Kick questionnaire    Fear of Current or Ex-Partner: No    Emotionally Abused: No    Physically Abused: No    Sexually Abused: No     Review of Systems  All other systems reviewed and are negative.      Objective:   Physical Exam Constitutional:      General: He is not in acute distress.    Appearance: Normal appearance. He is not ill-appearing or toxic-appearing.  Cardiovascular:     Rate and Rhythm: Normal rate and regular rhythm.  Pulmonary:     Effort: Pulmonary effort is normal. Prolonged expiration present. No accessory muscle usage or respiratory distress.     Breath sounds: Decreased air movement present.  Abdominal:     Hernia: There is no hernia in the left inguinal area or right inguinal area.  Genitourinary:    Penis: Normal.      Testes: Normal.  Lymphadenopathy:     Lower Body: No right inguinal adenopathy. No left inguinal adenopathy.  Neurological:     Mental Status: He is alert.           Assessment & Plan:  Right inguinal pain I believe the patient is most likely dealing pain related to scar tissue from his previous surgery.  I do not appreciate any recurrent hernia or infection or lymphadenopathy.  I recommended that the patient wear a hernia belt for reinforcement especially when doing any heavy lifting.

## 2022-08-01 DIAGNOSIS — J441 Chronic obstructive pulmonary disease with (acute) exacerbation: Secondary | ICD-10-CM | POA: Diagnosis not present

## 2022-08-01 DIAGNOSIS — J449 Chronic obstructive pulmonary disease, unspecified: Secondary | ICD-10-CM | POA: Diagnosis not present

## 2022-08-18 ENCOUNTER — Ambulatory Visit: Payer: Medicare Other

## 2022-08-18 NOTE — Progress Notes (Signed)
Pt walked-in c/o of shortness of breath. Spoke w/NP-AMBER, see if pt wants to be seen today later around 3 or 4p.    Time: 1045AM Checked pt pulse Ox, reading is at 96% w/HR at 72. Ask pt how he was feeling after checking pulse ox, pt stated,"I'm fine and not having anymore SOB."  Advice pt to go home and monitor his oxygen and contact EMS/and office if he feels that he is having SOB again. Suggest trying EMS first, EMS can rechek his pulse Ox and give him recommendations as what to do, ie going to UC or ED.   Pt voiced understanding and left ambulatory w/no c/o of any SOB

## 2022-08-20 NOTE — Progress Notes (Deleted)
Chronic Care Management Pharmacy Note  08/20/2022 Name:  Michael Evans MRN:  [id number] DOB:  12/11/1940  Summary: Initial visit with PharmD.  Patient really only uses inhalers no other Rx meds.  Since starting Breztri breathing has improved.  Copay manageable at this time as he is on samples.  Stressed adherence.  Recommendations/Changes made from today's visit: No changes - contact me if needed to help with copay  Plan: FU 6 months   Subjective: Michael Evans is an 81 y.o. year old male who is a primary patient of Pickard, Cammie Mcgee, MD.  The CCM team was consulted for assistance with disease management and care coordination needs.    Engaged with patient by telephone for initial visit in response to provider referral for pharmacy case management and/or care coordination services.   Consent to Services:  The patient was given the following information about Chronic Care Management services today, agreed to services, and gave verbal consent: 1. CCM service includes personalized support from designated clinical staff supervised by the primary care provider, including individualized plan of care and coordination with other care providers 2. 24/7 contact phone numbers for assistance for urgent and routine care needs. 3. Service will only be billed when office clinical staff spend 20 minutes or more in a month to coordinate care. 4. Only one practitioner may furnish and bill the service in a calendar month. 5.The patient may stop CCM services at any time (effective at the end of the month) by phone call to the office staff. 6. The patient will be responsible for cost sharing (co-pay) of up to 20% of the service fee (after annual deductible is met). Patient agreed to services and consent obtained.  Patient Care Team: [name], MD as PCP - General (Family Medicine) Edythe Clarity, [facility] as Pharmacist (Pharmacist)  Recent office visits:  02/11/2022 OV (PCP) [name], MD; no  medication changes indicated.   10/17/2021 OV (PCP) [name], MD; no medication changes indicated.   10/11/2021 OV (PCP) [name], MD; discontinue Symbicort and we will replace it with Breztri 2 inhalations twice daily.  I want to place the patient back on prednisone 40 mg daily for 7 days due to persistent wheezing and congestion and purulent sputum.  I want him to do albuterol 2 puffs every 6 hours on a scheduled basis and have his sister administer the medication to him and then reassess him in 1 week to see if his breathing is improving.   10/04/2021 OV (PCP) [name], MD; Patient received 80 mg of Depo-Medrol x1 now.  Begin prednisone 60 mg a day for the next week.  Start Avelox 400 mg p.o. daily for the next week given his increasing and changing sputum consistency.  Use albuterol every 4-6 hours for wheezing.  Continue home oxygen.  If breathing worsens, he is instructed to go the emergency room   Recent consult visits:  01/07/2022 OV (General Surgery) [name], MD; No medication changes indicated.   11/29/2021 OV (Pulmonology) Parrett, Fonnie Mu, NP; Continue on BREZTRI 2 puffs Twice daily , rinse after use.  Albuterol inhaler or neb every 6hr as needed.  Mucinex DM Twice daily  As needed  cough/congestion  Restart Flutter valve Twice daily  .  Continue on Oxygen 2l/m .  Activity as tolerated.    11/21/2021 OV (General Surgery) [name], MD; No medication changes indicated.   11/14/2021 OV (General Surgery) [name],  MD; No medication changes indicated.   10/31/2021 OV (Pulmonology) Tanda Rockers, MD; no medication changes indicated.   10/08/2021 OV (General Surgery) [name], MD; no medication changes indicated.   Hospital visits:  01/27/2022 ED visit for Unilateral inguinal hernia without obstruction or gangrene at [facility] Emergency Department -No medication changes indicated.   12/09/2021 Admission for  Surgery, left inguinal hernia repair -Rx oxycodone 5 mg q4hrs prn for severe pain   11/19/2021 ED visit for Left inguinal hernia -No medication changes indicated.   10/17/2021 ED to Hospital Admission due to Small bowl obstruction at [place] date: 10/17/2021 Discharge date: 10/20/2021 -STOP taking prednisone. -All other medication remain the same.   09/24/2021 ED to Hospital Admission due to COPD exacerbation Admit date: 09/24/2021 Discharge date: 09/29/2021 -STOP taking these medications guaiFENesin-codeine 100-10 mg/43m syrup   09/17/2021 ED visit for Chronic obstructive pulmonary disease with acute exacerbation -Rx Azithromyin 250 mg daily -Rx guaiFENesin-Codeine 100-10 mg/5 mL q6hrs prn  Objective:  Lab Results  Component Value Date   CREATININE 0.89 06/29/2022   BUN 27 (H) 06/29/2022   EGFR 73 10/04/2021   GFRNONAA >60 06/29/2022   GFRAA 95 11/11/2018   NA 135 06/29/2022   K 4.6 06/29/2022   CALCIUM 9.5 06/29/2022   CO2 26 06/29/2022   GLUCOSE 257 (H) 06/29/2022    Lab Results  Component Value Date/Time   HGBA1C 6.5 (H) 05/23/2022 07:03 PM   HGBA1C 6.7 (H) 09/26/2021 05:38 AM    Last diabetic Eye exam: No results found for: "HMDIABEYEEXA"  Last diabetic Foot exam: No results found for: "HMDIABFOOTEX"   No results found for: "CHOL", "HDL", "LDLCALC", "LDLDIRECT", "TRIG", "CHOLHDL"     Latest Ref Rng & Units 06/29/2022    2:04 PM 05/23/2022    1:19 PM 01/27/2022    3:01 PM  Hepatic Function  Total Protein 6.5 - 8.1 g/dL 6.6  7.0  7.3   Albumin 3.5 - 5.0 g/dL 3.7  3.3  4.3   AST 15 - 41 U/L _0 ALT 0 - 44 U/L _1 Alk Phosphatase 38 - 126 U/L 48  55  55   Total Bilirubin 0.3 - 1.2 mg/dL 0.7  0.6  0.7   Bilirubin, Direct 0.0 - 0.2 mg/dL  0.1      Lab Results  Component Value Date/Time   TSH 3.226 01/22/2016 12:12 AM       Latest Ref Rng & Units 06/29/2022    2:04 PM 05/24/2022    5:26 AM 05/23/2022    7:03 PM  CBC   WBC 4.0 - 10.5 K/uL 8.5  15.4  21.8   Hemoglobin 13.0 - 17.0 g/dL 14.9  12.5  13.0   Hematocrit 39.0 - 52.0 % 44.7  39.0  39.3   Platelets 150 - 400 K/uL 208  227  238     No results found for: "VD25OH"  Clinical ASCVD: No  The ASCVD Risk score (Arnett DK, et al., 2019) failed to calculate for the following reasons:   The 2019 ASCVD risk score is only valid for ages 449to 72      07/28/2022   12:32 PM 02/07/2022    8:57 AM 12/07/2020   11:32 AM  Depression screen PHQ 2/9  Decreased Interest 0 0 0  Down, Depressed, Hopeless 0 0 0  PHQ - 2 Score 0 0 0     Social  History   Tobacco Use  Smoking Status Former   Packs/day: 1.50   Years: 54.00   Total pack years: 81.00   Types: Cigarettes   Quit date: 12/01/2001   Years since quitting: 20.7  Smokeless Tobacco Never   BP Readings from Last 3 Encounters:  07/31/22 130/70  07/28/22 118/60  06/29/22 (!) 165/69   Pulse Readings from Last 3 Encounters:  07/31/22 82  07/28/22 75  06/29/22 87   Wt Readings from Last 3 Encounters:  07/31/22 126 lb (57.2 kg)  07/28/22 126 lb (57.2 kg)  06/29/22 135 lb (61.2 kg)   BMI Readings from Last 3 Encounters:  07/31/22 20.97 kg/m  07/28/22 20.97 kg/m  06/29/22 22.47 kg/m    Assessment/Interventions: Review of patient past medical history, allergies, medications, health status, including review of consultants reports, laboratory and other test data, was performed as part of comprehensive evaluation and provision of chronic care management services.   SDOH:  (Social Determinants of Health) assessments and interventions performed: Yes SDOH Interventions    Flowsheet Row Clinical Support from 02/07/2022 in [place] Interventions   Food Insecurity Interventions Intervention Not Indicated  Housing Interventions Intervention Not Indicated  Transportation Interventions Intervention Not Indicated  Financial Strain Interventions Intervention Not Indicated   Physical Activity Interventions Other (Comments)  [Pt states he works in yard and around house.]  Stress Interventions Intervention Not Indicated  Social Connections Interventions Other (Comment)  [Lives with sister. Seens children often per pt.]      Financial Resource Strain: Low Risk  (02/07/2022)   Overall Financial Resource Strain (CARDIA)    Difficulty of Paying Living Expenses: Not hard at all   Food Insecurity: No Food Insecurity (02/07/2022)   Hunger Vital Sign    Worried About Running Out of Food in the Last Year: Never true    Ran Out of Food in the Last Year: Never true    SDOH Screenings   Food Insecurity: No Food Insecurity (02/07/2022)  Housing: Low Risk  (02/07/2022)  Transportation Needs: No Transportation Needs (02/07/2022)  Alcohol Screen: Low Risk  (02/07/2022)  Depression (PHQ2-9): Low Risk  (07/28/2022)  Financial Resource Strain: Low Risk  (02/07/2022)  Physical Activity: Insufficiently Active (02/07/2022)  Social Connections: Socially Isolated (02/07/2022)  Stress: No Stress Concern Present (02/07/2022)  Tobacco Use: Medium Risk (06/29/2022)    CCM Care Plan  Allergies  Allergen Reactions   Benzyl Alcohol Itching   Amlodipine Swelling    Swelling in feet   Aspirin Other (See Comments)    Upset stomach.   Other Hypertension    Hazelnuts   Penicillins Hives    Has patient had a PCN reaction causing immediate rash, facial/tongue/throat swelling, SOB or lightheadedness with hypotension: Yes Has patient had a PCN reaction causing severe rash involving mucus membranes or skin necrosis: No Has patient had a PCN reaction that required hospitalization No Has patient had a PCN reaction occurring within the last 10 years: No If all of the above answers are "NO", then may proceed with Cephalosporin use.    Sulfa Antibiotics Hives   Tylenol [Acetaminophen] Other (See Comments)    Pt reports he has had liver issues and was told not to take tylenol     Medications  Reviewed Today     Reviewed by Chriss Driver, LPN (Licensed Practical Nurse) on 07/31/22 at [place] List Status: <None>   Medication Order Taking? Sig Documenting Provider Last Dose Status Informant  albuterol (  VENTOLIN HFA) 108 (90 Base) MCG/ACT inhaler [id number] Yes INHALE 1-2 PUFFS BY MOUTH EVERY 6 HOURS AS NEEDED FOR WHEEZE OR SHORTNESS OF BREATH  Patient taking differently: Inhale 2 puffs into the lungs every 6 (six) hours as needed for wheezing or shortness of breath.   [name], MD Taking Active Self, Pharmacy Records, Family Member  Ascorbic Acid (VITAMIN C) 1000 MG tablet [id number] Yes Take 1,000 mg by mouth daily. [provider] Taking Active Self, Pharmacy Records, Family Member  Budeson-Glycopyrrol-Formoterol (BREZTRI AEROSPHERE) 160-9-4.8 MCG/ACT AERO [id number] Yes Inhale 2 puffs into the lungs in the morning and at bedtime. [provider] Taking Active Self, Pharmacy Records, Family Member  cholecalciferol (VITAMIN D) 25 MCG (1000 UNIT) tablet [id number] Yes Take 3,000 Units by mouth daily. [provider] Taking Active Self, Pharmacy Records, Family Member  cyanocobalamin 2000 MCG tablet [id number] Yes Take 1,000 mcg by mouth daily. [provider] Taking Active Self, Pharmacy Records, Family Member           Med Note ([place] Jan 22, 2016  9:20 AM)    Multiple Vitamin (MULTIVITAMIN) tablet [id number] Yes Take 1 tablet by mouth daily. Men's health [provider] Taking Active Self, Pharmacy Records, Family Member  Nebulizer [place] [id number] Yes 1 each by Does not apply route daily as needed. PLEASE PROVIDE NEBULIZER KIT TO INCLUDE TUBING/MASK/MOUTHPIECE [name], MD Taking Active Self, Pharmacy Records, Family Member  nitrofurantoin, macrocrystal-monohydrate, (MACROBID) 100 MG capsule [id number] Yes Take 1 capsule (100 mg total) by mouth 2 (two) times daily. [name], MD Taking Active Self,  Pharmacy Records, Family Member  Omega-3 Fatty Acids (FISH OIL CONCENTRATE PO) [id number] Yes Take 1,000 mg by mouth daily. [provider] Taking Active Self, Pharmacy Records, Family Member  oxyCODONE (OXY IR/ROXICODONE) 5 MG immediate release tablet [id number] Yes Take 1 tablet (5 mg total) by mouth every 4 (four) hours as needed for severe pain or breakthrough pain. [name], MD Taking Active Self, Pharmacy Records, Family Member  pyridoxine (B-6) 100 MG tablet [id number] Yes Take 100 mg by mouth daily. [provider] Taking Active Self, Pharmacy Records, Family Member  triamcinolone cream (KENALOG) 0.1 % [id number] Yes Apply 1 Application topically 2 (two) times daily. [name], MD Taking Active             Patient Active Problem List   Diagnosis Date Noted   Sepsis ([place]) 05/23/2022   Sinus bradycardia 03/20/2022   GERD (gastroesophageal reflux disease) 03/19/2022   Preop pulmonary/respiratory exam 11/29/2021   Left inguinal hernia 11/14/2021   Upper airway cough syndrome 10/31/2021   Partial small bowel obstruction ([place]) 10/18/2021   Dehydration 10/18/2021   Hypertensive urgency 10/18/2021   Chronic respiratory failure with hypoxia ([place]) 10/18/2021   Abdominal pain 10/18/2021   Bilateral inguinal hernia with obstruction and without gangrene    Right inguinal hernia 10/08/2021   COPD GOLD 2 09/26/2021   Acute on chronic respiratory failure ([place]) 09/24/2021   Tachycardia 09/26/2017   Influenza A 01/24/2016   Acute on chronic respiratory failure with hypoxia ([place]) 01/22/2016   SIRS (systemic inflammatory response syndrome) ([place]) 01/22/2016   Leukocytosis 01/22/2016   Hyperglycemia 01/22/2016   COPD exacerbation ([place]) 02/14/2015   Shingles rash 08/26/2014   Hypoxia 08/26/2014   OSA (obstructive sleep apnea) 04/21/2011   HOARSENESS 10/03/2009   RHINITIS 08/02/2009   COPD with emphysema ([place]) 07/05/2009   Essential hypertension 06/07/2009  Immunization History  Administered Date(s) Administered   Fluad Quad(high Dose 65+) 08/23/2019, 12/07/2020, 11/29/2021   Influenza Whole 09/18/2009, 11/27/2010   Influenza, High Dose Seasonal PF 09/21/2017, 11/04/2018   Influenza,inj,Quad PF,6+ Mos 09/13/2013, 01/23/2016, 08/15/2016   Moderna Sars-Covid-2 Vaccination 02/06/2020, 03/07/2020   Pneumococcal Polysaccharide-23 09/18/2009, 01/23/2016    Conditions to be addressed/monitored:  HTN, COPD, Hyperglycemia  There are no care plans that you recently modified to display for this patient.     Medication Assistance: None required.  Patient affirms current coverage meets needs.  Compliance/Adherence/Medication fill history: Care Gaps: None  Star-Rating Drugs: N/A  Patient's preferred pharmacy is:  [id number] [place] [address]Phone: [phone]: [phone] Uses pill box? No - takes few oral Pt endorses 100% compliance  We discussed: Benefits of medication synchronization, packaging and delivery as well as enhanced pharmacist oversight with [employer]. Patient decided to: Continue current medication management strategy  Care Plan and Follow Up Patient Decision:  Patient agrees to Care Plan and Follow-up.  Plan: The care management team will reach out to the patient again over the next 180 days.  CBeverly Milch PharmD, CPP Clinical Pharmacist Practitioner BJonni SangerFamily Medicine ([phone]  Current Barriers:  Unable to maintain control of COPD  Pharmacist Clinical Goal(s):  Patient will achieve improvement in COPD symptoms as evidenced by breathing through collaboration with PharmD and provider.   Interventions: 1:1 collaboration with PSusy Frizzle MD regarding development and update of comprehensive plan of care as evidenced by provider attestation and co-signature Inter-disciplinary care team  collaboration (see longitudinal plan of care) Comprehensive medication review performed; medication list updated in electronic medical record  Hypertension (BP goal <140/90) -Controlled -Current treatment: None -Medications previously tried: hydralazine, losartan, HCTZ  -Current home readings: not checking at home -Current dietary habits: avoids salt, drinks mostly water.  Sister reports that his diet is very good and he does not eat anything with oils, etc. -Current exercise habits: walking around the house, to mailbox and back (about 400 yards) -Denies hypotensive/hypertensive symptoms -Educated on BP goals and benefits of medications for prevention of heart attack, stroke and kidney damage; Daily salt intake goal < 2300 mg; Exercise goal of 150 minutes per week; Importance of home blood pressure monitoring; -Counseled to monitor BP at home a few times per week, document, and provide log at future appointments -Recommended to continue current medication  COPD (Goal: control symptoms and prevent exacerbations) -Controlled -Current treatment  Breztri 160-9-4.8 mcg Appropriate, Effective, Safe, Accessible Albuterol HFA 95m Appropriate, Effective, Safe, Accessible -Medications previously tried: Duoneb, combivent, Symbicort, Spiriva  -Gold Grade: Gold 2 (FEV1 50-79%) -Current COPD Classification:  A (low sx, <2 exacerbations/yr) -MMRC/CAT score:     11/29/2021    9:24 AM  CAT Score  Total CAT Score 25  -Pulmonary function testing: Pulmonary Functions Testing Results:  No results found for: FEV1, FVC, FEV1FVC, TLC, DLCO  -Exacerbations requiring treatment in last 6 months: One - admitted -Patient reports consistent use of maintenance inhaler -Frequency of rescue inhaler use: twice daily -Counseled on [place] inhaler technique; Benefits of consistent maintenance inhaler use When to use rescue inhaler Differences between maintenance and rescue inhalers -Recommended to continue  current medication [facility].  Reports his breathing has gotten much better since this.  He is currently on samples.  Provided my contact number for them to call in case  when he goes to pick up rx the copay is a burden.  Stressed importance of adherence moving forward.  Hyperglycemia (Goal: Control A1c) -Controlled -Current treatment  None -Medications previously tried: none -Steroids due to COPD exacerbations playing role in elevated sugars. Control breathing will hopefully prevent spikes in sugar with steroids.  -Recommended continue current management, no changes at thist ime  Patient Goals/Self-Care Activities Patient will:  - focus on medication adherence by using inhalers twice daily as directed  Follow Up Plan: The care management team will reach out to the patient again over the next 180 days.

## 2022-08-28 ENCOUNTER — Telehealth: Payer: Self-pay

## 2022-08-31 DIAGNOSIS — J449 Chronic obstructive pulmonary disease, unspecified: Secondary | ICD-10-CM | POA: Diagnosis not present

## 2022-08-31 DIAGNOSIS — J441 Chronic obstructive pulmonary disease with (acute) exacerbation: Secondary | ICD-10-CM | POA: Diagnosis not present

## 2022-09-05 DIAGNOSIS — R310 Gross hematuria: Secondary | ICD-10-CM | POA: Diagnosis not present

## 2022-10-01 DIAGNOSIS — J449 Chronic obstructive pulmonary disease, unspecified: Secondary | ICD-10-CM | POA: Diagnosis not present

## 2022-10-01 DIAGNOSIS — J441 Chronic obstructive pulmonary disease with (acute) exacerbation: Secondary | ICD-10-CM | POA: Diagnosis not present

## 2022-10-07 DIAGNOSIS — R310 Gross hematuria: Secondary | ICD-10-CM | POA: Diagnosis not present

## 2022-10-07 DIAGNOSIS — N35013 Post-traumatic anterior urethral stricture: Secondary | ICD-10-CM | POA: Diagnosis not present

## 2022-10-10 DIAGNOSIS — N35013 Post-traumatic anterior urethral stricture: Secondary | ICD-10-CM | POA: Diagnosis not present

## 2022-10-15 ENCOUNTER — Telehealth: Payer: Self-pay | Admitting: Pharmacist

## 2022-10-15 NOTE — Progress Notes (Signed)
Chronic Care Management Pharmacy Assistant   Name: Michael Evans  MRN: [id number] DOB: 1941-01-13   Reason for Encounter: General Adherence Call    Recent office visits:  10/16/2022 OV (PCP) [name], MD;  I will treat him with Cipro 500 mg p.o. twice daily.   07/31/2022 OV (PCP) [name], MD; no medication changes indicated.  07/28/2022 OV (PCP) [name], MD; no medication changes indicated.  05/23/2022 OV (PCP) [name], MD; no medication changes. Patient to report to the emergency room.  Recent consult visits:  04/22/2022 OV (Gen Surgery) [name], MD; no medication changes indicated.  10/21/2022 OV (Gen Surgery) [name], MD;   Hospital visits:  06/29/2022 ED visit due to Enlarged prostate, dysuria -Rx ciprofloxacin 500 mg every 12 hours  06/20/2022 Admission for Cataract Extraction  05/23/2022 ED to Hospital Admission due to Community acquired pneumonia of right lung Admit date: 05/23/2022 Discharge date: 05/24/2022 He was started on supportive care, bronchodilators and azithromycin for airway inflammation.   05/05/2022 ED visit for COPD exacerbation -Rx prednisone 20 mg daily -Rx mucinex 600 mg twice daily   Medications: Outpatient Encounter Medications as of 10/15/2022  Medication Sig   albuterol (VENTOLIN HFA) 108 (90 Base) MCG/ACT inhaler INHALE 1-2 PUFFS BY MOUTH EVERY 6 HOURS AS NEEDED FOR WHEEZE OR SHORTNESS OF BREATH (Patient taking differently: Inhale 2 puffs into the lungs every 6 (six) hours as needed for wheezing or shortness of breath.)   Ascorbic Acid (VITAMIN C) 1000 MG tablet Take 1,000 mg by mouth daily.   Budeson-Glycopyrrol-Formoterol (BREZTRI AEROSPHERE) 160-9-4.8 MCG/ACT AERO Inhale 2 puffs into the lungs in the morning and at bedtime.   cholecalciferol (VITAMIN D) 25 MCG (1000 UNIT) tablet Take 3,000 Units by mouth daily.   cyanocobalamin 2000 MCG tablet Take 1,000 mcg by mouth daily.    Multiple Vitamin (MULTIVITAMIN) tablet Take 1 tablet by mouth daily. Men's health   Nebulizer MISC 1 each by Does not apply route daily as needed. PLEASE PROVIDE NEBULIZER KIT TO INCLUDE TUBING/MASK/MOUTHPIECE   nitrofurantoin, macrocrystal-monohydrate, (MACROBID) 100 MG capsule Take 1 capsule (100 mg total) by mouth 2 (two) times daily.   Omega-3 Fatty Acids (FISH OIL CONCENTRATE PO) Take 1,000 mg by mouth daily.   oxyCODONE (OXY IR/ROXICODONE) 5 MG immediate release tablet Take 1 tablet (5 mg total) by mouth every 4 (four) hours as needed for severe pain or breakthrough pain.   pyridoxine (B-6) 100 MG tablet Take 100 mg by mouth daily.   triamcinolone cream (KENALOG) 0.1 % Apply 1 Application topically 2 (two) times daily.   No facility-administered encounter medications on file as of 10/15/2022.   [place] for General Review Call   Chart Review:  Have there been any documented new, changed, or discontinued medications since last visit? No Has there been any documented recent hospitalizations or ED visits since last visit with Clinical Pharmacist? Yes Brief Summary: see above hospital visits   Adherence Review:  Does the Clinical Pharmacist Assistant have access to adherence rates? Yes Adherence rates for STAR metric medications: None Does the patient have >5 day gap between last estimated fill dates for any of the above medications or other medication gaps? No Reason for medication gaps.   Disease State Questions:  Able to connect with Patient?   Did patient have any problems with their health recently?  Note problems and Concerns: Have you had any admissions or emergency room visits or worsening  of your condition(s) since last visit?  Details of ED visit, hospital visit and/or worsening condition(s): Have you had any visits with new specialists or providers since your last visit?  Explain: Have you had any new health care problem(s) since your last visit?  New  problem(s) reported: Have you run out of any of your medications since you last spoke with clinical pharmacist?  What caused you to run out of your medications? Are there any medications you are not taking as prescribed?  What kept you from taking your medications as prescribed? Are you having any issues or side effects with your medications?  Note of issues or side effects: Do you have any other health concerns or questions you want to discuss with your Clinical Pharmacist before your next visit?  Note additional concerns and questions from Patient. Are there any health concerns that you feel we can do a better job addressing?  Note Patient's response. Are you having any problems with any of the following since the last visit: (select all that apply)    Details: 12. Any falls since last visit?   Details: 13. Any increased or uncontrolled pain since last visit?   Details: 14. Next visit Type:        Visit with:        Date:        Time:  66. Additional Details?   **Unsuccessful attempt at reaching patient to complete this call.**    Care Gaps: Medicare Annual Wellness: Next due on 02/08/2023 Hemoglobin A1C: 6.5% on 05/23/2022  Future Appointments  Date Time Provider [place]  02/13/2023  9:00 AM BSF[facility]-NURSE HEALTH ADVISOR [facility]   Star Rating Drugs: None  April D Calhoun, [place] Pharmacist Assistant [phone]

## 2022-10-16 ENCOUNTER — Ambulatory Visit (INDEPENDENT_AMBULATORY_CARE_PROVIDER_SITE_OTHER): Payer: Medicare Other | Admitting: Family Medicine

## 2022-10-16 VITALS — BP 110/68 | HR 68 | Ht 65.0 in | Wt 117.0 lb

## 2022-10-16 DIAGNOSIS — R300 Dysuria: Secondary | ICD-10-CM

## 2022-10-16 LAB — URINALYSIS, ROUTINE W REFLEX MICROSCOPIC
Bilirubin Urine: NEGATIVE
Glucose, UA: NEGATIVE
Hgb urine dipstick: NEGATIVE
Hyaline Cast: NONE SEEN /LPF
Ketones, ur: NEGATIVE
Nitrite: NEGATIVE
Protein, ur: NEGATIVE
RBC / HPF: NONE SEEN /HPF (ref 0–2)
Specific Gravity, Urine: 1.015 (ref 1.001–1.035)
Squamous Epithelial / HPF: NONE SEEN /HPF (ref <=5)
pH: 7 (ref 5.0–8.0)

## 2022-10-16 LAB — MICROSCOPIC MESSAGE

## 2022-10-16 MED ORDER — CIPROFLOXACIN HCL 500 MG PO TABS: 500.0000 mg | ORAL_TABLET | Freq: Two times a day (BID) | ORAL | 0 refills | Status: AC

## 2022-10-16 NOTE — Progress Notes (Signed)
Subjective:    Patient ID: Michael Evans, male    DOB: [birth year], 81 y.o.   MRN: [id number]  Patient recently had a procedure done at the urologist office.  I cannot find any operative notes but it sounds like the patient may have had a TURP.  He talks about "dilating his penis."  Patient is not able to provide an accurate history due to age-related cognitive decline.  However he states that ever since this procedure was performed he has had dysuria.  Is been gradually getting worse.  He also reports feeling sick.  He reports frequency and urgency.  Urinalysis today shows no blood, no nitrates but +2 leukocyte esterase.  Prostate exam today is normal without any tenderness.  There is no obvious trauma or injury to the head of the penis Past Medical History:  Diagnosis Date   Arthritis    Chronic rhinitis    COPD (chronic obstructive pulmonary disease) ([place])    PFT 06-26-09 FEV1  1.75( 71%), FVC 3.65( 101%), FEV1% 48, TLC 5.53(104%), DLCO 55%, no BD   Dyspnea    Hypertension    Nasal septal deviation    Nocturia    On home O2    2L N/C   OSA (obstructive sleep apnea) 04/21/2011   Skin cancer    Past Surgical History:  Procedure Laterality Date   APPENDECTOMY     BACK SURGERY     CATARACT EXTRACTION W/PHACO  01/06/2013   Procedure: CATARACT EXTRACTION PHACO AND INTRAOCULAR LENS PLACEMENT ([place]);  Surgeon: Tonny Branch, MD;  Location: [facility];  Service: Ophthalmology;  Laterality: Right;  CDE: 22.17   CATARACT EXTRACTION W/PHACO Left 06/20/2022   Procedure: CATARACT EXTRACTION PHACO AND INTRAOCULAR LENS PLACEMENT ([facility]);  Surgeon: Baruch Goldmann, MD;  Location: [facility];  Service: Ophthalmology;  Laterality: Left;  CDE 20.85   INGUINAL HERNIA REPAIR Left 12/09/2021   Procedure: HERNIA REPAIR INGUINAL ADULT WITH MESH;  Surgeon: [name], MD;  Location: [facility];  Service: General;  Laterality: Left;   INGUINAL HERNIA REPAIR Right 03/19/2022   Procedure: HERNIA REPAIR INGUINAL ADULT WITH MESH;   Surgeon: [name], MD;  Location: [facility];  Service: General;  Laterality: Right;   Current Outpatient Medications on File Prior to Visit  Medication Sig Dispense Refill   albuterol (VENTOLIN HFA) 108 (90 Base) MCG/ACT inhaler INHALE 1-2 PUFFS BY MOUTH EVERY 6 HOURS AS NEEDED FOR WHEEZE OR SHORTNESS OF BREATH (Patient taking differently: Inhale 2 puffs into the lungs every 6 (six) hours as needed for wheezing or shortness of breath.) 6.7 each 3   Ascorbic Acid (VITAMIN C) 1000 MG tablet Take 1,000 mg by mouth daily.     Budeson-Glycopyrrol-Formoterol (BREZTRI AEROSPHERE) 160-9-4.8 MCG/ACT AERO Inhale 2 puffs into the lungs in the morning and at bedtime.     cholecalciferol (VITAMIN D) 25 MCG (1000 UNIT) tablet Take 3,000 Units by mouth daily.     cyanocobalamin 2000 MCG tablet Take 1,000 mcg by mouth daily.     Multiple Vitamin (MULTIVITAMIN) tablet Take 1 tablet by mouth daily. Men's health     Nebulizer MISC 1 each by Does not apply route daily as needed. PLEASE PROVIDE NEBULIZER KIT TO INCLUDE TUBING/MASK/MOUTHPIECE 1 each 3   nitrofurantoin, macrocrystal-monohydrate, (MACROBID) 100 MG capsule Take 1 capsule (100 mg total) by mouth 2 (two) times daily. 14 capsule 0   Omega-3 Fatty Acids (FISH OIL CONCENTRATE PO) Take 1,000 mg by mouth daily.     oxyCODONE (  OXY IR/ROXICODONE) 5 MG immediate release tablet Take 1 tablet (5 mg total) by mouth every 4 (four) hours as needed for severe pain or breakthrough pain. 20 tablet 0   pyridoxine (B-6) 100 MG tablet Take 100 mg by mouth daily.     triamcinolone cream (KENALOG) 0.1 % Apply 1 Application topically 2 (two) times daily. 30 g 0   No current facility-administered medications on file prior to visit.   Allergies  Allergen Reactions   Benzyl Alcohol Itching   Amlodipine Swelling    Swelling in feet   Aspirin Other (See Comments)    Upset stomach.   Other Hypertension    Hazelnuts   Penicillins Hives    Has patient had a PCN  reaction causing immediate rash, facial/tongue/throat swelling, SOB or lightheadedness with hypotension: Yes Has patient had a PCN reaction causing severe rash involving mucus membranes or skin necrosis: No Has patient had a PCN reaction that required hospitalization No Has patient had a PCN reaction occurring within the last 10 years: No If all of the above answers are "NO", then may proceed with Cephalosporin use.    Sulfa Antibiotics Hives   Tylenol [Acetaminophen] Other (See Comments)    Pt reports he has had liver issues and was told not to take tylenol    Social History   Socioeconomic History   Marital status: Divorced    Spouse name: Not on file   Number of children: 3   Years of education: Not on file   Highest education level: Not on file  Occupational History   Occupation: Neurosurgeon: RETIRED  Tobacco Use   Smoking status: Former    Packs/day: 1.50    Years: 54.00    Total pack years: 81.00    Types: Cigarettes    Quit date: 12/01/2001    Years since quitting: 20.8   Smokeless tobacco: Never  Vaping Use   Vaping Use: Never used  Substance and Sexual Activity   Alcohol use: No   Drug use: No   Sexual activity: Not on file  Other Topics Concern   Not on file  Social History Narrative   Lives with sister.    Social Determinants of Health   Financial Resource Strain: Low Risk  (02/07/2022)   Overall Financial Resource Strain (CARDIA)    Difficulty of Paying Living Expenses: Not hard at all  Food Insecurity: No Food Insecurity (02/07/2022)   Hunger Vital Sign    Worried About Running Out of Food in the Last Year: Never true    Ran Out of Food in the Last Year: Never true  Transportation Needs: No Transportation Needs (02/07/2022)   PRAPARE - Hydrologist (Medical): No    Lack of Transportation (Non-Medical): No  Physical Activity: Insufficiently Active (02/07/2022)   Exercise Vital Sign    Days of Exercise per Week: 3  days    Minutes of Exercise per Session: 20 min  Stress: No Stress Concern Present (02/07/2022)   [place]    Feeling of Stress : Not at all  Social Connections: Socially Isolated (02/07/2022)   Social Connection and Isolation Panel [NHANES]    Frequency of Communication with Friends and Family: More than three times a week    Frequency of Social Gatherings with Friends and Family: More than three times a week    Attends Religious Services: Never    Retail buyer of [employer]  or Organizations: No    Attends Archivist Meetings: Never    Marital Status: Divorced  Human resources officer Violence: Not At Risk (02/07/2022)   Humiliation, Afraid, Rape, and Kick questionnaire    Fear of Current or Ex-Partner: No    Emotionally Abused: No    Physically Abused: No    Sexually Abused: No     Review of Systems  All other systems reviewed and are negative.      Objective:   Physical Exam Constitutional:      Appearance: Normal appearance. He is not ill-appearing or toxic-appearing.  Cardiovascular:     Rate and Rhythm: Normal rate and regular rhythm.  Pulmonary:     Breath sounds: No wheezing, rhonchi or rales.  Genitourinary:    Penis: Normal.      Prostate: Normal. Not tender and no nodules present.  Neurological:     Mental Status: He is alert.           Assessment & Plan:  Dysuria - Plan: Urinalysis, Routine w reflex microscopic I believe the patient has a postoperative urinary tract infection.  He is allergic to penicillin as well as sulfa for so I will treat him with Cipro 500 mg p.o. twice daily.  Await the culture results.  Recheck in 1 week if no better or sooner if worse

## 2022-10-19 LAB — URINE CULTURE
MICRO NUMBER:: 14199056
SPECIMEN QUALITY:: ADEQUATE

## 2022-10-21 ENCOUNTER — Ambulatory Visit (INDEPENDENT_AMBULATORY_CARE_PROVIDER_SITE_OTHER): Payer: Medicare Other | Admitting: General Surgery

## 2022-10-21 ENCOUNTER — Encounter: Payer: Self-pay | Admitting: General Surgery

## 2022-10-21 VITALS — BP 131/60 | HR 72 | Temp 97.5°F | Resp 16 | Ht 65.0 in | Wt 124.0 lb

## 2022-10-21 DIAGNOSIS — K4091 Unilateral inguinal hernia, without obstruction or gangrene, recurrent: Secondary | ICD-10-CM

## 2022-10-21 NOTE — Progress Notes (Signed)
[facility] History and Physical  Reason for Referral: Recurrent right inguinal hernia  Referring Physician: Self   Chief Complaint   Hernia     Michael Evans is a 81 y.o. male.  HPI: Michael Evans is well known to me and had bilateral open inguinal hernias this year. Unfortunately after his right inguinal hernia Michael Evans has had a recurrence.  CT scan demonstrated this recurrence in June. The patient says it is causing him some discomfort and Michael Evans wants to get it repaired.  Michael Evans is otherwise stable and continues to have to use O2 on occasion for his COPD.   Past Medical History:  Diagnosis Date   Arthritis    Chronic rhinitis    COPD (chronic obstructive pulmonary disease) ([place])    PFT 06-26-09 FEV1  1.75( 71%), FVC 3.65( 101%), FEV1% 48, TLC 5.53(104%), DLCO 55%, no BD   Dyspnea    Hypertension    Nasal septal deviation    Nocturia    On home O2    2L N/C   OSA (obstructive sleep apnea) 04/21/2011   Skin cancer     Past Surgical History:  Procedure Laterality Date   APPENDECTOMY     BACK SURGERY     CATARACT EXTRACTION W/PHACO  01/06/2013   Procedure: CATARACT EXTRACTION PHACO AND INTRAOCULAR LENS PLACEMENT ([place]);  Surgeon: Tonny Branch, MD;  Location: [facility];  Service: Ophthalmology;  Laterality: Right;  CDE: 22.17   CATARACT EXTRACTION W/PHACO Left 06/20/2022   Procedure: CATARACT EXTRACTION PHACO AND INTRAOCULAR LENS PLACEMENT ([facility]);  Surgeon: Baruch Goldmann, MD;  Location: [facility];  Service: Ophthalmology;  Laterality: Left;  CDE 20.85   INGUINAL HERNIA REPAIR Left 12/09/2021   Procedure: HERNIA REPAIR INGUINAL ADULT WITH MESH;  Surgeon: [name], MD;  Location: [facility];  Service: General;  Laterality: Left;   INGUINAL HERNIA REPAIR Right 03/19/2022   Procedure: HERNIA REPAIR INGUINAL ADULT WITH MESH;  Surgeon: [name], MD;  Location: [facility];  Service: General;  Laterality: Right;    Family History  Problem Relation Age of Onset   Lung cancer Mother      Social History   Tobacco Use   Smoking status: Former    Packs/day: 1.50    Years: 54.00    Total pack years: 81.00    Types: Cigarettes    Quit date: 12/01/2001    Years since quitting: 20.9   Smokeless tobacco: Never  Vaping Use   Vaping Use: Never used  Substance Use Topics   Alcohol use: No   Drug use: No    Medications: I have reviewed the patient's current medications. Allergies as of 10/21/2022       Reactions   Benzyl Alcohol Itching   Amlodipine Swelling   Swelling in feet   Aspirin Other (See Comments)   Upset stomach.   Other Hypertension   Hazelnuts   Penicillins Hives   Has patient had a PCN reaction causing immediate rash, facial/tongue/throat swelling, SOB or lightheadedness with hypotension: Yes Has patient had a PCN reaction causing severe rash involving mucus membranes or skin necrosis: No Has patient had a PCN reaction that required hospitalization No Has patient had a PCN reaction occurring within the last 10 years: No If all of the above answers are "NO", then may proceed with Cephalosporin use.   Sulfa Antibiotics Hives   Tylenol [acetaminophen] Other (See Comments)   Pt reports Michael Evans has had liver issues and was told not to take tylenol  Medication List        Accurate as of October 21, 2022  3:02 PM. If you have any questions, ask your nurse or doctor.          STOP taking these medications    nitrofurantoin (macrocrystal-monohydrate) 100 MG capsule Commonly known as: Macrobid Stopped by: [name], MD       TAKE these medications    albuterol 108 (90 Base) MCG/ACT inhaler Commonly known as: VENTOLIN HFA INHALE 1-2 PUFFS BY MOUTH EVERY 6 HOURS AS NEEDED FOR WHEEZE OR SHORTNESS OF BREATH What changed: See the new instructions.   Breztri Aerosphere 160-9-4.8 MCG/ACT Aero Generic drug: Budeson-Glycopyrrol-Formoterol Inhale 2 puffs into the lungs in the morning and at bedtime.   cholecalciferol 25 MCG (1000  UNIT) tablet Commonly known as: VITAMIN D3 Take 3,000 Units by mouth daily.   ciprofloxacin 500 MG tablet Commonly known as: Cipro Take 1 tablet (500 mg total) by mouth 2 (two) times daily for 10 days.   cyanocobalamin 2000 MCG tablet Take 1,000 mcg by mouth daily.   FISH OIL CONCENTRATE PO Take 1,000 mg by mouth daily.   multivitamin tablet Take 1 tablet by mouth daily. Men's health   Nebulizer Misc 1 each by Does not apply route daily as needed. PLEASE PROVIDE NEBULIZER KIT TO INCLUDE TUBING/MASK/MOUTHPIECE   oxyCODONE 5 MG immediate release tablet Commonly known as: Oxy IR/ROXICODONE Take 1 tablet (5 mg total) by mouth every 4 (four) hours as needed for severe pain or breakthrough pain.   pyridoxine 100 MG tablet Commonly known as: B-6 Take 100 mg by mouth daily.   triamcinolone cream 0.1 % Commonly known as: KENALOG Apply 1 Application topically 2 (two) times daily.   vitamin C 1000 MG tablet Take 1,000 mg by mouth daily.         ROS:  A comprehensive review of systems was negative except for: Gastrointestinal: positive for right inguinal hernia pain and recurrence   Blood pressure 131/60, pulse 72, temperature (!) 97.5 F (36.4 C), temperature source Oral, resp. rate 16, height _0  (1.651 m), weight 124 lb (56.2 kg), SpO2 90 %. Physical Exam Vitals reviewed.  Constitutional:      Appearance: Normal appearance.  HENT:     Head: Normocephalic.     Nose: Nose normal.  Eyes:     Extraocular Movements: Extraocular movements intact.  Cardiovascular:     Rate and Rhythm: Normal rate and regular rhythm.  Pulmonary:     Effort: Pulmonary effort is normal.     Breath sounds: Normal breath sounds.  Abdominal:     General: There is no distension.     Palpations: Abdomen is soft.     Tenderness: There is no abdominal tenderness.     Hernia: A hernia is present. Hernia is present in the right inguinal area.     Comments: Reducible hernia   Musculoskeletal:      Cervical back: Normal range of motion.  Neurological:     Mental Status: Michael Evans is alert.     Results: Reviewed CT- fat containing right inguinal hernia, could be more of a femoral hernia based on what I am seeing  CLINICAL DATA:  Cough, shortness of breath, sepsis.   EXAM: CT CHEST, ABDOMEN, AND PELVIS WITH CONTRAST   TECHNIQUE: Multidetector CT imaging of the chest, abdomen and pelvis was performed following the standard protocol during bolus administration of intravenous contrast.   RADIATION DOSE REDUCTION: This exam was performed according to the  departmental dose-optimization program which includes automated exposure control, adjustment of the mA and/or kV according to patient size and/or use of iterative reconstruction technique.   CONTRAST:  155m OMNIPAQUE IOHEXOL 300 MG/ML  SOLN   COMPARISON:  January 27, 2022.  September 17, 2021.   FINDINGS: CT CHEST FINDINGS   Cardiovascular: Atherosclerosis of thoracic aorta is noted without aneurysm or dissection. Normal cardiac size. No pericardial effusion.   Mediastinum/Nodes: No enlarged mediastinal, hilar, or axillary lymph nodes. Thyroid gland, trachea, and esophagus demonstrate no significant findings.   Lungs/Pleura: No pneumothorax or pleural effusion is noted. Severe emphysematous disease is noted bilaterally. Mild bilateral posterior basilar subsegmental atelectasis is noted. Ill-defined opacity is noted anteriorly in right upper lobe which may represent focal inflammation or pneumonia.   Musculoskeletal: No chest wall mass or suspicious bone lesions identified.   CT ABDOMEN PELVIS FINDINGS   Hepatobiliary: No focal liver abnormality is seen. No gallstones, gallbladder wall thickening, or biliary dilatation.   Pancreas: Unremarkable. No pancreatic ductal dilatation or surrounding inflammatory changes.   Spleen: Normal in size without focal abnormality.   Adrenals/Urinary Tract: Adrenal glands are unremarkable. No  hydronephrosis or renal obstruction is noted. No renal or ureteral calculi are noted. Bladder is unremarkable.   Stomach/Bowel: The stomach appears normal. There is no evidence of bowel obstruction or inflammation.   Vascular/Lymphatic: Aortic atherosclerosis. No enlarged abdominal or pelvic lymph nodes.   Reproductive: Mild prostatic enlargement is noted.   Other: No ascites is noted. Moderate size fat containing right inguinal hernia is noted.   Musculoskeletal: No acute or significant osseous findings.   IMPRESSION: Ill-defined small opacity is noted anteriorly in right upper lobe which may represent focal inflammation or pneumonia.   Mild bilateral posterior basilar subsegmental atelectasis is noted.   Moderate size fat containing right inguinal hernia is noted.   Mild prostatic enlargement.   Aortic Atherosclerosis (ICD10-I70.0) and Emphysema (ICD10-J43.9).     Electronically Signed   By: JMarijo ConceptionM.D.   On: 05/23/2022 16:18   Assessment & Plan:  Michael GAVIAis a 81y.o. male with a recurrent right inguinal hernia. The CT sort of looks like it could be femoral.    Discussed the risk and benefits including, bleeding, infection, use of mesh, risk of recurrence, risk of nerve damage causing numbness or changes in sensation, risk of damage to the cord structures. The patient understands the risk and benefits of repair with mesh, and has decided to proceed.  We also discussed open versus robotic assisted laparoscopic surgery and the use of mesh. We discussed that I do both robotic and open repairs with mesh, and that these are considered equivalent. We discussed reasons for opting for laparoscopic surgery including if a bilateral repair is needed or if a patient has a recurrence after an open repair. We discussed the option of watch and wait in men and discussed that in 5 years some studies report that 40% of men have crossed over to needing a hernia repair because the hernia  has become larger or symptomatic. We discussed that women are not appropriate candidate for watchful waiting due to the risk of femoral hernias.   Plan for a robotic laparoscopic inguinal hernia repair with mesh given the recurrence and potential femoral hernia.    All questions were answered to the satisfaction of the patient. Will admit overnight for monitoring.    LVirl Cagey11/21/2023, 3:02 PM

## 2022-10-21 NOTE — Patient Instructions (Signed)
Will plan to keep you overnight again for monitoring given your lung disease.  Will repair both sides if needed at the same time.    Robotic Assisted Laparoscopic Inguinal Hernia Repair, Adult Laparoscopic inguinal hernia repair is a surgical procedure to repair a small, weak spot in the groin muscles that allows fat or intestines from inside the abdomen to bulge out (inguinal hernia). This procedure may be planned, or it may be an emergency procedure. During the procedure, tissue that has bulged out is moved back into place, and the opening in the groin muscles is repaired. This is done through three small incisions in the abdomen. A thin tube with a light and camera on the end (laparoscope) is used to help perform the procedure. Tell a health care provider about: Any allergies you have. All medicines you are taking, including vitamins, herbs, eye drops, creams, and over-the-counter medicines. Any problems you or family members have had with anesthetic medicines. Any blood disorders you have. Any surgeries you have had. Any medical conditions you have. Whether you are pregnant or may be pregnant. What are the risks? Generally, this is a safe procedure. However, problems may occur, including: Infection. Bleeding. Allergic reactions to medicines. Damage to nearby structures or organs. Testicle damage or long-term pain and swelling of the scrotum, in males. Inability to completely empty the bladder (urinary retention). Blood clots. A collection of fluid that builds up under the skin (seroma). The hernia coming back (recurrence). What happens before the procedure? Medicines Ask your health care provider about: Changing or stopping your regular medicines. This is especially important if you are taking diabetes medicines or blood thinners. Taking medicines such as aspirin and ibuprofen. These medicines can thin your blood. Do not take these medicines unless your health care provider tells  you to take them. Taking over-the-counter medicines, vitamins, herbs, and supplements. General instructions Do not use any products that contain nicotine or tobacco for at least 4 weeks before the procedure, if possible. These products include cigarettes, chewing tobacco, and vaping devices, such as e-cigarettes. If you need help quitting, ask your health care provider. Ask your health care provider: How your surgery site will be marked. What steps will be taken to help prevent infection. These steps may include: Removing hair at the surgery site. Washing skin with a germ-killing soap. Taking antibiotic medicine. Plan to have a responsible adult take you home from the hospital or clinic. Plan to have a responsible adult care for you for the time you are told after you leave the hospital or clinic. This is important. What happens during the procedure? An IV will be inserted into one of your veins. You will be given one or more of the following: A medicine to help you relax (sedative). A medicine to make you fall asleep (general anesthetic). Three small incisions will be made in your abdomen. Your abdomen will be inflated with carbon dioxide gas to make the surgical area easier to see. A laparoscope and surgical instruments will be inserted through the incisions. The laparoscope will send images of the inside of your abdomen to a monitor in the room. Tissue that is bulging through the hernia may be removed or moved back into place. The hernia opening will be closed with a sheet of surgical mesh. The surgical instruments and laparoscope will be removed. Your incisions will be closed with stitches (sutures) and adhesive strips. A bandage (dressing) will be placed over your incisions. The procedure may vary among health care providers  and hospitals. What happens after the procedure? Your blood pressure, heart rate, breathing rate, and blood oxygen level will be monitored until you leave the  hospital or clinic. You will be given pain medicine as needed. You may continue to receive medicines and fluids through an IV. The IV will be removed after you can drink fluids. You will be encouraged to get up and move around and to take deep breaths frequently. If you were given a sedative during the procedure, it can affect you for several hours. Do not drive or operate machinery until your health care provider says that it is safe. Summary Laparoscopic inguinal hernia repair is a surgical procedure to repair a small, weak spot in the groin muscles that allows fat or intestines from inside the abdomen to bulge out (inguinal hernia). This procedure is done through three small incisions in the abdomen. A thin tube with a light and camera on the end (laparoscope) is used to help perform the procedure. After the procedure, you will be encouraged to get up and move around and to take deep breaths frequently. This information is not intended to replace advice given to you by your health care provider. Make sure you discuss any questions you have with your health care provider. Document Revised: 07/17/2020 Document Reviewed: 07/17/2020 Elsevier Patient Education  [place].

## 2022-10-24 NOTE — H&P (Signed)
[facility] History and Physical  Reason for Referral: Recurrent right inguinal hernia  Referring Physician: Self   Chief Complaint   Hernia     Zalman E Olmo is a 81 y.o. male.  HPI: Mr. Arts is well known to me and had bilateral open inguinal hernias this year. Unfortunately after his right inguinal hernia he has had a recurrence.  CT scan demonstrated this recurrence in June. The patient says it is causing him some discomfort and he wants to get it repaired.  He is otherwise stable and continues to have to use O2 on occasion for his COPD.   Past Medical History:  Diagnosis Date   Arthritis    Chronic rhinitis    COPD (chronic obstructive pulmonary disease) (HCC)    PFT 06-26-09 FEV1  1.75( 71%), FVC 3.65( 101%), FEV1% 48, TLC 5.53(104%), DLCO 55%, no BD   Dyspnea    Hypertension    Nasal septal deviation    Nocturia    On home O2    2L N/C   OSA (obstructive sleep apnea) 04/21/2011   Skin cancer     Past Surgical History:  Procedure Laterality Date   APPENDECTOMY     BACK SURGERY     CATARACT EXTRACTION W/PHACO  01/06/2013   Procedure: CATARACT EXTRACTION PHACO AND INTRAOCULAR LENS PLACEMENT (IOC);  Surgeon: Kerry Hunt, MD;  Location: [facility];  Service: Ophthalmology;  Laterality: Right;  CDE: 22.17   CATARACT EXTRACTION W/PHACO Left 06/20/2022   Procedure: CATARACT EXTRACTION PHACO AND INTRAOCULAR LENS PLACEMENT (IOC);  Surgeon: Wrzosek, James, MD;  Location: [facility];  Service: Ophthalmology;  Laterality: Left;  CDE 20.85   INGUINAL HERNIA REPAIR Left 12/09/2021   Procedure: HERNIA REPAIR INGUINAL ADULT WITH MESH;  Surgeon: Albaraa Swingle C, MD;  Location: [facility];  Service: General;  Laterality: Left;   INGUINAL HERNIA REPAIR Right 03/19/2022   Procedure: HERNIA REPAIR INGUINAL ADULT WITH MESH;  Surgeon: Frankie Zito C, MD;  Location: [facility];  Service: General;  Laterality: Right;    Family History  Problem Relation Age of Onset   Lung cancer Mother      Social History   Tobacco Use   Smoking status: Former    Packs/day: 1.50    Years: 54.00    Total pack years: 81.00    Types: Cigarettes    Quit date: 12/01/2001    Years since quitting: 20.9   Smokeless tobacco: Never  Vaping Use   Vaping Use: Never used  Substance Use Topics   Alcohol use: No   Drug use: No    Medications: I have reviewed the patient's current medications. Allergies as of 10/21/2022       Reactions   Benzyl Alcohol Itching   Amlodipine Swelling   Swelling in feet   Aspirin Other (See Comments)   Upset stomach.   Other Hypertension   Hazelnuts   Penicillins Hives   Has patient had a PCN reaction causing immediate rash, facial/tongue/throat swelling, SOB or lightheadedness with hypotension: Yes Has patient had a PCN reaction causing severe rash involving mucus membranes or skin necrosis: No Has patient had a PCN reaction that required hospitalization No Has patient had a PCN reaction occurring within the last 10 years: No If all of the above answers are "NO", then may proceed with Cephalosporin use.   Sulfa Antibiotics Hives   Tylenol [acetaminophen] Other (See Comments)   Pt reports he has had liver issues and was told not to take tylenol           Medication List        Accurate as of October 21, 2022  3:02 PM. If you have any questions, ask your nurse or doctor.          STOP taking these medications    nitrofurantoin (macrocrystal-monohydrate) 100 MG capsule Commonly known as: Macrobid Stopped by: Mersadie Kavanaugh C Siarra Gilkerson, MD       TAKE these medications    albuterol 108 (90 Base) MCG/ACT inhaler Commonly known as: VENTOLIN HFA INHALE 1-2 PUFFS BY MOUTH EVERY 6 HOURS AS NEEDED FOR WHEEZE OR SHORTNESS OF BREATH What changed: See the new instructions.   Breztri Aerosphere 160-9-4.8 MCG/ACT Aero Generic drug: Budeson-Glycopyrrol-Formoterol Inhale 2 puffs into the lungs in the morning and at bedtime.   cholecalciferol 25 MCG (1000  UNIT) tablet Commonly known as: VITAMIN D3 Take 3,000 Units by mouth daily.   ciprofloxacin 500 MG tablet Commonly known as: Cipro Take 1 tablet (500 mg total) by mouth 2 (two) times daily for 10 days.   cyanocobalamin 2000 MCG tablet Take 1,000 mcg by mouth daily.   FISH OIL CONCENTRATE PO Take 1,000 mg by mouth daily.   multivitamin tablet Take 1 tablet by mouth daily. Men's health   Nebulizer Misc 1 each by Does not apply route daily as needed. PLEASE PROVIDE NEBULIZER KIT TO INCLUDE TUBING/MASK/MOUTHPIECE   oxyCODONE 5 MG immediate release tablet Commonly known as: Oxy IR/ROXICODONE Take 1 tablet (5 mg total) by mouth every 4 (four) hours as needed for severe pain or breakthrough pain.   pyridoxine 100 MG tablet Commonly known as: B-6 Take 100 mg by mouth daily.   triamcinolone cream 0.1 % Commonly known as: KENALOG Apply 1 Application topically 2 (two) times daily.   vitamin C 1000 MG tablet Take 1,000 mg by mouth daily.         ROS:  A comprehensive review of systems was negative except for: Gastrointestinal: positive for right inguinal hernia pain and recurrence   Blood pressure 131/60, pulse 72, temperature (!) 97.5 F (36.4 C), temperature source Oral, resp. rate 16, height 5' 5" (1.651 m), weight 124 lb (56.2 kg), SpO2 90 %. Physical Exam Vitals reviewed.  Constitutional:      Appearance: Normal appearance.  HENT:     Head: Normocephalic.     Nose: Nose normal.  Eyes:     Extraocular Movements: Extraocular movements intact.  Cardiovascular:     Rate and Rhythm: Normal rate and regular rhythm.  Pulmonary:     Effort: Pulmonary effort is normal.     Breath sounds: Normal breath sounds.  Abdominal:     General: There is no distension.     Palpations: Abdomen is soft.     Tenderness: There is no abdominal tenderness.     Hernia: A hernia is present. Hernia is present in the right inguinal area.     Comments: Reducible hernia   Musculoskeletal:      Cervical back: Normal range of motion.  Neurological:     Mental Status: He is alert.     Results: Reviewed CT- fat containing right inguinal hernia, could be more of a femoral hernia based on what I am seeing  CLINICAL DATA:  Cough, shortness of breath, sepsis.   EXAM: CT CHEST, ABDOMEN, AND PELVIS WITH CONTRAST   TECHNIQUE: Multidetector CT imaging of the chest, abdomen and pelvis was performed following the standard protocol during bolus administration of intravenous contrast.   RADIATION DOSE REDUCTION: This exam was performed according to the   departmental dose-optimization program which includes automated exposure control, adjustment of the mA and/or kV according to patient size and/or use of iterative reconstruction technique.   CONTRAST:  100mL OMNIPAQUE IOHEXOL 300 MG/ML  SOLN   COMPARISON:  January 27, 2022.  September 17, 2021.   FINDINGS: CT CHEST FINDINGS   Cardiovascular: Atherosclerosis of thoracic aorta is noted without aneurysm or dissection. Normal cardiac size. No pericardial effusion.   Mediastinum/Nodes: No enlarged mediastinal, hilar, or axillary lymph nodes. Thyroid gland, trachea, and esophagus demonstrate no significant findings.   Lungs/Pleura: No pneumothorax or pleural effusion is noted. Severe emphysematous disease is noted bilaterally. Mild bilateral posterior basilar subsegmental atelectasis is noted. Ill-defined opacity is noted anteriorly in right upper lobe which may represent focal inflammation or pneumonia.   Musculoskeletal: No chest wall mass or suspicious bone lesions identified.   CT ABDOMEN PELVIS FINDINGS   Hepatobiliary: No focal liver abnormality is seen. No gallstones, gallbladder wall thickening, or biliary dilatation.   Pancreas: Unremarkable. No pancreatic ductal dilatation or surrounding inflammatory changes.   Spleen: Normal in size without focal abnormality.   Adrenals/Urinary Tract: Adrenal glands are unremarkable. No  hydronephrosis or renal obstruction is noted. No renal or ureteral calculi are noted. Bladder is unremarkable.   Stomach/Bowel: The stomach appears normal. There is no evidence of bowel obstruction or inflammation.   Vascular/Lymphatic: Aortic atherosclerosis. No enlarged abdominal or pelvic lymph nodes.   Reproductive: Mild prostatic enlargement is noted.   Other: No ascites is noted. Moderate size fat containing right inguinal hernia is noted.   Musculoskeletal: No acute or significant osseous findings.   IMPRESSION: Ill-defined small opacity is noted anteriorly in right upper lobe which may represent focal inflammation or pneumonia.   Mild bilateral posterior basilar subsegmental atelectasis is noted.   Moderate size fat containing right inguinal hernia is noted.   Mild prostatic enlargement.   Aortic Atherosclerosis (ICD10-I70.0) and Emphysema (ICD10-J43.9).     Electronically Signed   By: James  Green Jr M.D.   On: 05/23/2022 16:18   Assessment & Plan:  Ezel E Chausse is a 81 y.o. male with a recurrent right inguinal hernia. The CT sort of looks like it could be femoral.    Discussed the risk and benefits including, bleeding, infection, use of mesh, risk of recurrence, risk of nerve damage causing numbness or changes in sensation, risk of damage to the cord structures. The patient understands the risk and benefits of repair with mesh, and has decided to proceed.  We also discussed open versus robotic assisted laparoscopic surgery and the use of mesh. We discussed that I do both robotic and open repairs with mesh, and that these are considered equivalent. We discussed reasons for opting for laparoscopic surgery including if a bilateral repair is needed or if a patient has a recurrence after an open repair. We discussed the option of watch and wait in men and discussed that in 5 years some studies report that 40% of men have crossed over to needing a hernia repair because the hernia  has become larger or symptomatic. We discussed that women are not appropriate candidate for watchful waiting due to the risk of femoral hernias.   Plan for a robotic laparoscopic inguinal hernia repair with mesh given the recurrence and potential femoral hernia.    All questions were answered to the satisfaction of the patient. Will admit overnight for monitoring.    Koltyn Kelsay C Randal Yepiz 10/21/2022, 3:02 PM       

## 2022-10-31 DIAGNOSIS — J449 Chronic obstructive pulmonary disease, unspecified: Secondary | ICD-10-CM | POA: Diagnosis not present

## 2022-10-31 DIAGNOSIS — J441 Chronic obstructive pulmonary disease with (acute) exacerbation: Secondary | ICD-10-CM | POA: Diagnosis not present

## 2022-11-13 NOTE — Patient Instructions (Signed)
Michael Evans  11/13/2022     '@PREFPERIOPPHARMACY'$ @   Your procedure is scheduled on  11/19/2022.   Report to [facility] at  0800  A.M.    Call this number if you have problems the morning of surgery:  [phone]  If you experience any cold or flu symptoms such as cough, fever, chills, shortness of breath, etc. between now and your scheduled surgery, please notify us at the above number.   Remember:  Do not eat or drink after midnight.         Use your nebulizer and your inhaler before you come and bring your rescue inhaler with you.     Take these medicines the morning of surgery with A SIP OF WATER                               oxycodone (if needed).    Do not wear jewelry, make-up or nail polish.  Do not wear lotions, powders, or perfumes, or deodorant.  Do not shave 48 hours prior to surgery.  Men may shave face and neck.  Do not bring valuables to the hospital.  [facility] is not responsible for any belongings or valuables.  Contacts, dentures or bridgework may not be worn into surgery.  Leave your suitcase in the car.  After surgery it may be brought to your room.  For patients admitted to the hospital, discharge time will be determined by your treatment team.  Patients discharged the day of surgery will not be allowed to drive home and must have someone with them for 24 hours.    Special instructions:   DO NOT smoke tobacco or vape for 24 hours before your procedure.  Please read over the following fact sheets that you were given. Pain Booklet, Coughing and Deep Breathing, Surgical Site Infection Prevention, Anesthesia Post-op Instructions, and Care and Recovery After Surgery      Laparoscopic Inguinal Hernia Repair, Adult, Care After The following information offers guidance on how to care for yourself after your procedure. Your health care provider may also give you more specific instructions. If you have problems or questions, contact your  health care provider. What can I expect after the procedure? After the procedure, it is common to have: Pain. Swelling and bruising around the incision area. Scrotal swelling, in males. Some fluid or blood draining from your incisions. Follow these instructions at home: Medicines Take over-the-counter and prescription medicines only as told by your health care provider. Ask your health care provider if the medicine prescribed to you: Requires you to avoid driving or using machinery. Can cause constipation. You may need to take these actions to prevent or treat constipation: Drink enough fluid to keep your urine pale yellow. Take over-the-counter or prescription medicines. Eat foods that are high in fiber, such as beans, whole grains, and fresh fruits and vegetables. Limit foods that are high in fat and processed sugars, such as fried or sweet foods. Incision care  Follow instructions from your health care provider about how to take care of your incisions. Make sure you: Wash your hands with soap and water for at least 20 seconds before and after you change your bandage (dressing). If soap and water are not available, use hand sanitizer. Change your dressing as told by your health care provider. Leave stitches (sutures), skin glue, or adhesive strips in place. These skin closures  may need to stay in place for 2 weeks or longer. If adhesive strip edges start to loosen and curl up, you may trim the loose edges. Do not remove adhesive strips completely unless your health care provider tells you to do that. Check your incision area every day for signs of infection. Check for: More redness, swelling, or pain. More fluid or blood. Warmth. Pus or a bad smell. Wear loose, soft clothing while your incisions heal. Managing pain and swelling If directed, put ice on the painful or swollen areas. To do this: Put ice in a plastic bag. Place a towel between your skin and the bag. Leave the ice on  for 20 minutes, 2-3 times a day. Remove the ice if your skin turns bright red. This is very important. If you cannot feel pain, heat, or cold, you have a greater risk of damage to the area.  Activity Do not lift anything that is heavier than 10 lb (4.5 kg), or the limit that you are told, until your health care provider says that it is safe. Ask your health care provider what activities are safe for you. A lot of activity during the first week after surgery can increase pain and swelling. For 1 week after your procedure: Avoid activities that take a lot of effort, such as exercise or sports. You may walk and climb stairs as needed for daily activity, but avoid long walks or climbing stairs for exercise. General instructions If you were given a sedative during the procedure, it can affect you for several hours. Do not drive or operate machinery until your health care provider says that it is safe. Do not take baths, swim, or use a hot tub until your health care provider approves. Ask your health care provider if you may take showers. You may only be allowed to take sponge baths. Do not use any products that contain nicotine or tobacco. These products include cigarettes, chewing tobacco, and vaping devices, such as e-cigarettes. If you need help quitting, ask your health care provider. Keep all follow-up visits. This is important. Contact a health care provider if: You have any of these signs of infection: More redness, swelling, or pain around your incisions or your groin area. More fluid or blood coming from an incision. Warmth coming from an incision. Pus or a bad smell coming from an incision. A fever or chills. You have more swelling in your scrotum, if you are male. You have severe pain and medicines do not help. You have abdominal pain or swelling. You cannot urinate or have a bowel movement. You faint or feel dizzy. You have nausea and vomiting. Get help right away if: You have  redness, warmth, or pain in your leg. You have chest pain. You have problems breathing. These symptoms may represent a serious problem that is an emergency. Do not wait to see if the symptoms will go away. Get medical help right away. Call your local emergency services (911 in the [place] Do not drive yourself to the hospital. Summary Pain, swelling, and bruising are common after the procedure. Check your incision area every day for signs of infection, such as more redness, swelling, or pain. Put ice on painful or swollen areas for 20 minutes, 2-3 times a day. This information is not intended to replace advice given to you by your health care provider. Make sure you discuss any questions you have with your health care provider. Document Revised: 07/17/2020 Document Reviewed: 07/17/2020 Elsevier Patient Education  2023  [place] Anesthesia, Adult, Care After The following information offers guidance on how to care for yourself after your procedure. Your health care provider may also give you more specific instructions. If you have problems or questions, contact your health care provider. What can I expect after the procedure? After the procedure, it is common for people to: Have pain or discomfort at the IV site. Have nausea or vomiting. Have a sore throat or hoarseness. Have trouble concentrating. Feel cold or chills. Feel weak, sleepy, or tired (fatigue). Have soreness and body aches. These can affect parts of the body that were not involved in surgery. Follow these instructions at home: For the time period you were told by your health care provider:  Rest. Do not participate in activities where you could fall or become injured. Do not drive or use machinery. Do not drink alcohol. Do not take sleeping pills or medicines that cause drowsiness. Do not make important decisions or sign legal documents. Do not take care of children on your own. General instructions Drink  enough fluid to keep your urine pale yellow. If you have sleep apnea, surgery and certain medicines can increase your risk for breathing problems. Follow instructions from your health care provider about wearing your sleep device: Anytime you are sleeping, including during daytime naps. While taking prescription pain medicines, sleeping medicines, or medicines that make you drowsy. Return to your normal activities as told by your health care provider. Ask your health care provider what activities are safe for you. Take over-the-counter and prescription medicines only as told by your health care provider. Do not use any products that contain nicotine or tobacco. These products include cigarettes, chewing tobacco, and vaping devices, such as e-cigarettes. These can delay incision healing after surgery. If you need help quitting, ask your health care provider. Contact a health care provider if: You have nausea or vomiting that does not get better with medicine. You vomit every time you eat or drink. You have pain that does not get better with medicine. You cannot urinate or have bloody urine. You develop a skin rash. You have a fever. Get help right away if: You have trouble breathing. You have chest pain. You vomit blood. These symptoms may be an emergency. Get help right away. Call 911. Do not wait to see if the symptoms will go away. Do not drive yourself to the hospital. Summary After the procedure, it is common to have a sore throat, hoarseness, nausea, vomiting, or to feel weak, sleepy, or fatigue. For the time period you were told by your health care provider, do not drive or use machinery. Get help right away if you have difficulty breathing, have chest pain, or vomit blood. These symptoms may be an emergency. This information is not intended to replace advice given to you by your health care provider. Make sure you discuss any questions you have with your health care  provider. Document Revised: 02/14/2022 Document Reviewed: 02/14/2022 Elsevier Patient Education  [place]. How to Use Chlorhexidine Before Surgery Chlorhexidine gluconate (CHG) is a germ-killing (antiseptic) solution that is used to clean the skin. It can get rid of the bacteria that normally live on the skin and can keep them away for about 24 hours. To clean your skin with CHG, you may be given: A CHG solution to use in the shower or as part of a sponge bath. A prepackaged cloth that contains CHG. Cleaning your skin with CHG may help lower the risk for  infection: While you are staying in the intensive care unit of the hospital. If you have a vascular access, such as a central line, to provide short-term or long-term access to your veins. If you have a catheter to drain urine from your bladder. If you are on a ventilator. A ventilator is a machine that helps you breathe by moving air in and out of your lungs. After surgery. What are the risks? Risks of using CHG include: A skin reaction. Hearing loss, if CHG gets in your ears and you have a perforated eardrum. Eye injury, if CHG gets in your eyes and is not rinsed out. The CHG product catching fire. Make sure that you avoid smoking and flames after applying CHG to your skin. Do not use CHG: If you have a chlorhexidine allergy or have previously reacted to chlorhexidine. On babies younger than 58 months of age. How to use CHG solution Use CHG only as told by your health care provider, and follow the instructions on the label. Use the full amount of CHG as directed. Usually, this is one bottle. During a shower Follow these steps when using CHG solution during a shower (unless your health care provider gives you different instructions): Start the shower. Use your normal soap and shampoo to wash your face and hair. Turn off the shower or move out of the shower stream. Pour the CHG onto a clean washcloth. Do not use any type of  brush or rough-edged sponge. Starting at your neck, lather your body down to your toes. Make sure you follow these instructions: If you will be having surgery, pay special attention to the part of your body where you will be having surgery. Scrub this area for at least 1 minute. Do not use CHG on your head or face. If the solution gets into your ears or eyes, rinse them well with water. Avoid your genital area. Avoid any areas of skin that have broken skin, cuts, or scrapes. Scrub your back and under your arms. Make sure to wash skin folds. Let the lather sit on your skin for 1-2 minutes or as long as told by your health care provider. Thoroughly rinse your entire body in the shower. Make sure that all body creases and crevices are rinsed well. Dry off with a clean towel. Do not put any substances on your body afterward--such as powder, lotion, or perfume--unless you are told to do so by your health care provider. Only use lotions that are recommended by the manufacturer. Put on clean clothes or pajamas. If it is the night before your surgery, sleep in clean sheets.  During a sponge bath Follow these steps when using CHG solution during a sponge bath (unless your health care provider gives you different instructions): Use your normal soap and shampoo to wash your face and hair. Pour the CHG onto a clean washcloth. Starting at your neck, lather your body down to your toes. Make sure you follow these instructions: If you will be having surgery, pay special attention to the part of your body where you will be having surgery. Scrub this area for at least 1 minute. Do not use CHG on your head or face. If the solution gets into your ears or eyes, rinse them well with water. Avoid your genital area. Avoid any areas of skin that have broken skin, cuts, or scrapes. Scrub your back and under your arms. Make sure to wash skin folds. Let the lather sit on your skin for 1-2 minutes  or as long as told by  your health care provider. Using a different clean, wet washcloth, thoroughly rinse your entire body. Make sure that all body creases and crevices are rinsed well. Dry off with a clean towel. Do not put any substances on your body afterward--such as powder, lotion, or perfume--unless you are told to do so by your health care provider. Only use lotions that are recommended by the manufacturer. Put on clean clothes or pajamas. If it is the night before your surgery, sleep in clean sheets. How to use CHG prepackaged cloths Only use CHG cloths as told by your health care provider, and follow the instructions on the label. Use the CHG cloth on clean, dry skin. Do not use the CHG cloth on your head or face unless your health care provider tells you to. When washing with the CHG cloth: Avoid your genital area. Avoid any areas of skin that have broken skin, cuts, or scrapes. Before surgery Follow these steps when using a CHG cloth to clean before surgery (unless your health care provider gives you different instructions): Using the CHG cloth, vigorously scrub the part of your body where you will be having surgery. Scrub using a back-and-forth motion for 3 minutes. The area on your body should be completely wet with CHG when you are done scrubbing. Do not rinse. Discard the cloth and let the area air-dry. Do not put any substances on the area afterward, such as powder, lotion, or perfume. Put on clean clothes or pajamas. If it is the night before your surgery, sleep in clean sheets.  For general bathing Follow these steps when using CHG cloths for general bathing (unless your health care provider gives you different instructions). Use a separate CHG cloth for each area of your body. Make sure you wash between any folds of skin and between your fingers and toes. Wash your body in the following order, switching to a new cloth after each step: The front of your neck, shoulders, and chest. Both of your  arms, under your arms, and your hands. Your stomach and groin area, avoiding the genitals. Your right leg and foot. Your left leg and foot. The back of your neck, your back, and your buttocks. Do not rinse. Discard the cloth and let the area air-dry. Do not put any substances on your body afterward--such as powder, lotion, or perfume--unless you are told to do so by your health care provider. Only use lotions that are recommended by the manufacturer. Put on clean clothes or pajamas. Contact a health care provider if: Your skin gets irritated after scrubbing. You have questions about using your solution or cloth. You swallow any chlorhexidine. Call your local poison control center (1-(563)256-9780 in the [place] Get help right away if: Your eyes itch badly, or they become very red or swollen. Your skin itches badly and is red or swollen. Your hearing changes. You have trouble seeing. You have swelling or tingling in your mouth or throat. You have trouble breathing. These symptoms may represent a serious problem that is an emergency. Do not wait to see if the symptoms will go away. Get medical help right away. Call your local emergency services (911 in the [place] Do not drive yourself to the hospital. Summary Chlorhexidine gluconate (CHG) is a germ-killing (antiseptic) solution that is used to clean the skin. Cleaning your skin with CHG may help to lower your risk for infection. You may be given CHG to use for bathing. It may be in  a bottle or in a prepackaged cloth to use on your skin. Carefully follow your health care provider's instructions and the instructions on the product label. Do not use CHG if you have a chlorhexidine allergy. Contact your health care provider if your skin gets irritated after scrubbing. This information is not intended to replace advice given to you by your health care provider. Make sure you discuss any questions you have with your health care provider. Document  Revised: 03/17/2022 Document Reviewed: 01/28/2021 Elsevier Patient Education  [place].

## 2022-11-14 ENCOUNTER — Encounter (HOSPITAL_COMMUNITY): Payer: Self-pay

## 2022-11-14 ENCOUNTER — Encounter (HOSPITAL_COMMUNITY)
Admission: RE | Admit: 2022-11-14 | Discharge: 2022-11-14 | Disposition: A | Discharge status: Home / Self Care | Payer: Medicare Other | Source: Ambulatory Visit | Attending: General Surgery | Admitting: General Surgery

## 2022-11-14 VITALS — BP 138/65 | HR 56 | Temp 97.4°F | Resp 18 | Ht 65.0 in | Wt 123.9 lb

## 2022-11-14 DIAGNOSIS — Z01812 Encounter for preprocedural laboratory examination: Secondary | ICD-10-CM | POA: Insufficient documentation

## 2022-11-14 DIAGNOSIS — Z01818 Encounter for other preprocedural examination: Secondary | ICD-10-CM

## 2022-11-14 HISTORY — DX: Other complications of anesthesia, initial encounter: T88.59XA

## 2022-11-14 LAB — CBC WITH DIFFERENTIAL/PLATELET
Abs Immature Granulocytes: 0.02 10*3/uL (ref 0.00–0.07)
Basophils Absolute: 0 10*3/uL (ref 0.0–0.1)
Basophils Relative: 0 %
Eosinophils Absolute: 0.1 10*3/uL (ref 0.0–0.5)
Eosinophils Relative: 2 %
HCT: 47.7 % (ref 39.0–52.0)
Hemoglobin: 15.3 g/dL (ref 13.0–17.0)
Immature Granulocytes: 0 %
Lymphocytes Relative: 35 %
Lymphs Abs: 2.4 10*3/uL (ref 0.7–4.0)
MCH: 28.2 pg (ref 26.0–34.0)
MCHC: 32.1 g/dL (ref 30.0–36.0)
MCV: 87.8 fL (ref 80.0–100.0)
Monocytes Absolute: 0.7 10*3/uL (ref 0.1–1.0)
Monocytes Relative: 10 %
Neutro Abs: 3.6 10*3/uL (ref 1.7–7.7)
Neutrophils Relative %: 53 %
Platelets: 213 10*3/uL (ref 150–400)
RBC: 5.43 MIL/uL (ref 4.22–5.81)
RDW: 14.6 % (ref 11.5–15.5)
WBC: 6.8 10*3/uL (ref 4.0–10.5)
nRBC: 0 % (ref 0.0–0.2)

## 2022-11-14 LAB — TYPE AND SCREEN
ABO/RH(D): A POS
Antibody Screen: NEGATIVE

## 2022-11-17 ENCOUNTER — Telehealth: Payer: Self-pay | Admitting: Pharmacist

## 2022-11-17 NOTE — Progress Notes (Signed)
Chronic Care Management Pharmacy Assistant   Name: ESCHOL AUXIER  MRN: [id number] DOB: [birth year]   Reason for Encounter: General Adherence Call         Recent office visits:  10/16/2022 [facility] (PCP) [name], MD; Cipro 500 mg p.o. twice daily.   Recent consult visits:  10/21/2022 [facility] (Gen Surgery) [name], MD; no medication changes indicated.  Hospital visits:  None since last Adherence Cal  Medications: Outpatient Encounter Medications as of 11/17/2022  Medication Sig   albuterol (VENTOLIN HFA) 108 (90 Base) MCG/ACT inhaler INHALE 1-2 PUFFS BY MOUTH EVERY 6 HOURS AS NEEDED FOR WHEEZE OR SHORTNESS OF BREATH (Patient taking differently: Inhale 2 puffs into the lungs every 6 (six) hours as needed for wheezing or shortness of breath.)   Ascorbic Acid (VITAMIN C) 1000 MG tablet Take 1,000 mg by mouth daily.   Budeson-Glycopyrrol-Formoterol (BREZTRI AEROSPHERE) 160-9-4.8 MCG/ACT AERO Inhale 2 puffs into the lungs in the morning and at bedtime.   cholecalciferol (VITAMIN D) 25 MCG (1000 UNIT) tablet Take 3,000 Units by mouth daily.   cyanocobalamin 2000 MCG tablet Take 1,000 mcg by mouth daily.   Multiple Vitamin (MULTIVITAMIN) tablet Take 1 tablet by mouth daily. Men's health   Nebulizer MISC 1 each by Does not apply route daily as needed. PLEASE PROVIDE NEBULIZER KIT TO INCLUDE TUBING/MASK/MOUTHPIECE   Omega-3 Fatty Acids (FISH OIL CONCENTRATE PO) Take 1,000 mg by mouth daily.   oxyCODONE (OXY IR/ROXICODONE) 5 MG immediate release tablet Take 1 tablet (5 mg total) by mouth every 4 (four) hours as needed for severe pain or breakthrough pain.   pyridoxine (B-6) 100 MG tablet Take 100 mg by mouth daily.   triamcinolone cream (KENALOG) 0.1 % Apply 1 Application topically 2 (two) times daily.   No facility-administered encounter medications on file as of 11/17/2022.   [place] for General Review Call   Chart Review:  Have there been any documented  new, changed, or discontinued medications since last visit? No Has there been any documented recent hospitalizations or ED visits since last visit with Clinical Pharmacist? No   Adherence Review:  Does the Clinical Pharmacist Assistant have access to adherence rates? No Adherence rates for STAR metric medications: None Does the patient have >5 day gap between last estimated fill dates for any of the above medications or other medication gaps? No Reason for medication gaps: None Do you receive your medications through PAP? No  If Yes, what is the status? Last received?   Disease State Questions:  Able to connect with Patient? Yes Did patient have any problems with their health recently? No Have you had any admissions or emergency room visits or worsening of your condition(s) since last visit? No Have you had any visits with new specialists or providers since your last visit? No Have you had any new health care problem(s) since your last visit? No Have you run out of any of your medications since you last spoke with clinical pharmacist? No Are there any medications you are not taking as prescribed? No Are you having any issues or side effects with your medications? No Do you have any other health concerns or questions you want to discuss with your Clinical Pharmacist before your next visit? No Are there any health concerns that you feel we can do a better job addressing? No Are you having any problems with any of the following since the last visit:  None 12. Any falls since last  visit? No 13. Any increased or uncontrolled pain since last visit? No  14. Additional Details? Patients sister states the patient is having hernia surgery on 11/19/2022.   Care Gaps: Medicare Annual Wellness: Completed 02/13/2023 Hemoglobin A1C: 6.5% on 05/23/2022  Future Appointments  Date Time Provider [place]  02/13/2023  9:00 AM [facility]-NURSE HEALTH ADVISOR [facility]   Star Rating  Drugs: None  April D Calhoun, [place] Pharmacist Assistant [phone]

## 2022-11-19 ENCOUNTER — Ambulatory Visit (HOSPITAL_BASED_OUTPATIENT_CLINIC_OR_DEPARTMENT_OTHER): Payer: Medicare Other | Admitting: Certified Registered Nurse Anesthetist

## 2022-11-19 ENCOUNTER — Other Ambulatory Visit: Payer: Self-pay

## 2022-11-19 ENCOUNTER — Encounter (HOSPITAL_COMMUNITY): Payer: Self-pay | Admitting: General Surgery

## 2022-11-19 ENCOUNTER — Encounter (HOSPITAL_COMMUNITY)
Admission: RE | Disposition: A | Discharge status: Home Health | Payer: Self-pay | Source: Home / Self Care | Attending: General Surgery

## 2022-11-19 ENCOUNTER — Ambulatory Visit (HOSPITAL_COMMUNITY): Payer: Medicare Other | Admitting: Certified Registered Nurse Anesthetist

## 2022-11-19 ENCOUNTER — Observation Stay (HOSPITAL_COMMUNITY)
Admission: RE | Admit: 2022-11-19 | Discharge: 2022-11-20 | Disposition: A | Discharge status: Home Health | Payer: Medicare Other | Attending: General Surgery | Admitting: General Surgery

## 2022-11-19 DIAGNOSIS — K4191 Unilateral femoral hernia, without obstruction or gangrene, recurrent: Secondary | ICD-10-CM | POA: Insufficient documentation

## 2022-11-19 DIAGNOSIS — Z9981 Dependence on supplemental oxygen: Secondary | ICD-10-CM | POA: Diagnosis not present

## 2022-11-19 DIAGNOSIS — I10 Essential (primary) hypertension: Secondary | ICD-10-CM | POA: Insufficient documentation

## 2022-11-19 DIAGNOSIS — J441 Chronic obstructive pulmonary disease with (acute) exacerbation: Secondary | ICD-10-CM

## 2022-11-19 DIAGNOSIS — Z85828 Personal history of other malignant neoplasm of skin: Secondary | ICD-10-CM | POA: Diagnosis not present

## 2022-11-19 DIAGNOSIS — K4190 Unilateral femoral hernia, without obstruction or gangrene, not specified as recurrent: Secondary | ICD-10-CM

## 2022-11-19 DIAGNOSIS — R5381 Other malaise: Secondary | ICD-10-CM | POA: Insufficient documentation

## 2022-11-19 DIAGNOSIS — Z87891 Personal history of nicotine dependence: Secondary | ICD-10-CM | POA: Diagnosis not present

## 2022-11-19 DIAGNOSIS — K4091 Unilateral inguinal hernia, without obstruction or gangrene, recurrent: Secondary | ICD-10-CM | POA: Diagnosis not present

## 2022-11-19 DIAGNOSIS — J449 Chronic obstructive pulmonary disease, unspecified: Secondary | ICD-10-CM

## 2022-11-19 HISTORY — PX: FEMORAL HERNIA REPAIR: SHX632

## 2022-11-19 LAB — ABO/RH: ABO/RH(D): A POS

## 2022-11-19 SURGERY — HERNIA REPAIR FEMORAL
Anesthesia: General | Laterality: Right

## 2022-11-19 MED ORDER — FENTANYL CITRATE (PF) 250 MCG/5ML IJ SOLN
INTRAMUSCULAR | Status: AC
  Filled 2022-11-19: qty 5

## 2022-11-19 MED ORDER — LIDOCAINE HCL (PF) 0.5 % IJ SOLN
INTRAMUSCULAR | Status: AC
  Filled 2022-11-19: qty 50

## 2022-11-19 MED ORDER — FENTANYL CITRATE (PF) 250 MCG/5ML IJ SOLN
INTRAMUSCULAR | Status: DC | PRN
  Administered 2022-11-19 (×2): 50 ug via INTRAVENOUS
  Administered 2022-11-19: 25 ug via INTRAVENOUS
  Administered 2022-11-19: 50 ug via INTRAVENOUS

## 2022-11-19 MED ORDER — OXYCODONE HCL 5 MG/5ML PO SOLN: 5.0000 mg | Freq: Once | ORAL | Status: DC | PRN

## 2022-11-19 MED ORDER — FENTANYL CITRATE PF 50 MCG/ML IJ SOSY: 25.0000 ug | PREFILLED_SYRINGE | INTRAMUSCULAR | Status: DC | PRN

## 2022-11-19 MED ORDER — ONDANSETRON HCL 4 MG/2ML IJ SOLN: 4.0000 mg | Freq: Four times a day (QID) | INTRAMUSCULAR | Status: DC | PRN

## 2022-11-19 MED ORDER — CIPROFLOXACIN IN D5W 400 MG/200ML IV SOLN
INTRAVENOUS | Status: AC
  Filled 2022-11-19: qty 200

## 2022-11-19 MED ORDER — METOPROLOL TARTRATE 5 MG/5ML IV SOLN: 5.0000 mg | Freq: Four times a day (QID) | INTRAVENOUS | Status: DC | PRN

## 2022-11-19 MED ORDER — MORPHINE SULFATE (PF) 2 MG/ML IV SOLN: 2.0000 mg | INTRAVENOUS | Status: DC | PRN

## 2022-11-19 MED ORDER — STERILE WATER FOR IRRIGATION IR SOLN
Status: DC | PRN
  Administered 2022-11-19: 500 mL

## 2022-11-19 MED ORDER — BUPIVACAINE LIPOSOME 1.3 % IJ SUSP
INTRAMUSCULAR | Status: AC
  Filled 2022-11-19: qty 20

## 2022-11-19 MED ORDER — CHLORHEXIDINE GLUCONATE CLOTH 2 % EX PADS: 6.0000 | MEDICATED_PAD | Freq: Once | CUTANEOUS | Status: DC

## 2022-11-19 MED ORDER — ONDANSETRON HCL 4 MG/2ML IJ SOLN
INTRAMUSCULAR | Status: DC | PRN
  Administered 2022-11-19: 4 mg via INTRAVENOUS

## 2022-11-19 MED ORDER — CIPROFLOXACIN IN D5W 400 MG/200ML IV SOLN
400.0000 mg | INTRAVENOUS | Status: AC
  Administered 2022-11-19: 400 mg via INTRAVENOUS

## 2022-11-19 MED ORDER — NEBULIZER MISC: 1.0000 | Freq: Every day | Status: DC | PRN

## 2022-11-19 MED ORDER — ROCURONIUM BROMIDE 10 MG/ML (PF) SYRINGE
PREFILLED_SYRINGE | INTRAVENOUS | Status: DC | PRN
  Administered 2022-11-19: 20 mg via INTRAVENOUS
  Administered 2022-11-19: 100 mg via INTRAVENOUS
  Administered 2022-11-19: 20 mg via INTRAVENOUS

## 2022-11-19 MED ORDER — ORAL CARE MOUTH RINSE: 15.0000 mL | Freq: Once | OROMUCOSAL | Status: DC

## 2022-11-19 MED ORDER — BUPIVACAINE LIPOSOME 1.3 % IJ SUSP
INTRAMUSCULAR | Status: DC | PRN
  Administered 2022-11-19: 10 mL

## 2022-11-19 MED ORDER — LIDOCAINE HCL (PF) 2 % IJ SOLN
INTRAMUSCULAR | Status: AC
  Filled 2022-11-19: qty 5

## 2022-11-19 MED ORDER — PROPOFOL 10 MG/ML IV BOLUS
INTRAVENOUS | Status: AC
  Filled 2022-11-19: qty 20

## 2022-11-19 MED ORDER — DIPHENHYDRAMINE HCL 12.5 MG/5ML PO ELIX: 12.5000 mg | ORAL_SOLUTION | Freq: Four times a day (QID) | ORAL | Status: DC | PRN

## 2022-11-19 MED ORDER — ALBUTEROL SULFATE HFA 108 (90 BASE) MCG/ACT IN AERS
INHALATION_SPRAY | RESPIRATORY_TRACT | Status: AC
  Filled 2022-11-19: qty 6.7

## 2022-11-19 MED ORDER — DOCUSATE SODIUM 100 MG PO CAPS
100.0000 mg | ORAL_CAPSULE | Freq: Two times a day (BID) | ORAL | Status: DC
  Administered 2022-11-19 – 2022-11-20 (×2): 100 mg via ORAL
  Filled 2022-11-19 (×2): qty 1

## 2022-11-19 MED ORDER — ONDANSETRON 4 MG PO TBDP: 4.0000 mg | ORAL_TABLET | Freq: Four times a day (QID) | ORAL | Status: DC | PRN

## 2022-11-19 MED ORDER — OXYCODONE HCL 5 MG PO TABS: 5.0000 mg | ORAL_TABLET | Freq: Once | ORAL | Status: DC | PRN

## 2022-11-19 MED ORDER — DIPHENHYDRAMINE HCL 50 MG/ML IJ SOLN: 12.5000 mg | Freq: Four times a day (QID) | INTRAMUSCULAR | Status: DC | PRN

## 2022-11-19 MED ORDER — ALBUTEROL SULFATE HFA 108 (90 BASE) MCG/ACT IN AERS: 2.0000 | INHALATION_SPRAY | Freq: Four times a day (QID) | RESPIRATORY_TRACT | Status: DC | PRN

## 2022-11-19 MED ORDER — DEXAMETHASONE SODIUM PHOSPHATE 10 MG/ML IJ SOLN
INTRAMUSCULAR | Status: DC | PRN
  Administered 2022-11-19: 5 mg via INTRAVENOUS

## 2022-11-19 MED ORDER — VITAMIN D 25 MCG (1000 UNIT) PO TABS
3000.0000 [IU] | ORAL_TABLET | Freq: Every day | ORAL | Status: DC
  Administered 2022-11-19 – 2022-11-20 (×2): 3000 [IU] via ORAL
  Filled 2022-11-19 (×2): qty 3

## 2022-11-19 MED ORDER — ONDANSETRON HCL 4 MG/2ML IJ SOLN
INTRAMUSCULAR | Status: AC
  Filled 2022-11-19: qty 2

## 2022-11-19 MED ORDER — ONDANSETRON HCL 4 MG/2ML IJ SOLN: 4.0000 mg | Freq: Once | INTRAMUSCULAR | Status: DC | PRN

## 2022-11-19 MED ORDER — ROCURONIUM BROMIDE 10 MG/ML (PF) SYRINGE
PREFILLED_SYRINGE | INTRAVENOUS | Status: AC
  Filled 2022-11-19: qty 10

## 2022-11-19 MED ORDER — OXYCODONE HCL 5 MG PO TABS: 5.0000 mg | ORAL_TABLET | ORAL | Status: DC | PRN

## 2022-11-19 MED ORDER — SUGAMMADEX SODIUM 200 MG/2ML IV SOLN
INTRAVENOUS | Status: DC | PRN
  Administered 2022-11-19: 224.8 mg via INTRAVENOUS

## 2022-11-19 MED ORDER — LIDOCAINE HCL (PF) 2 % IJ SOLN
INTRAMUSCULAR | Status: DC | PRN
  Administered 2022-11-19: 1 mg/kg/h via INTRADERMAL

## 2022-11-19 MED ORDER — VITAMIN C 500 MG PO TABS
1000.0000 mg | ORAL_TABLET | Freq: Every day | ORAL | Status: DC
  Administered 2022-11-19 – 2022-11-20 (×2): 1000 mg via ORAL
  Filled 2022-11-19 (×2): qty 2

## 2022-11-19 MED ORDER — PANTOPRAZOLE SODIUM 40 MG IV SOLR
40.0000 mg | Freq: Every day | INTRAVENOUS | Status: DC
  Administered 2022-11-19: 40 mg via INTRAVENOUS
  Filled 2022-11-19: qty 10

## 2022-11-19 MED ORDER — KETOROLAC TROMETHAMINE 30 MG/ML IJ SOLN
INTRAMUSCULAR | Status: AC
  Filled 2022-11-19: qty 1

## 2022-11-19 MED ORDER — ARFORMOTEROL TARTRATE 15 MCG/2ML IN NEBU
15.0000 ug | INHALATION_SOLUTION | Freq: Two times a day (BID) | RESPIRATORY_TRACT | Status: DC
  Administered 2022-11-19 – 2022-11-20 (×2): 15 ug via RESPIRATORY_TRACT
  Filled 2022-11-19 (×2): qty 2

## 2022-11-19 MED ORDER — SIMETHICONE 80 MG PO CHEW: 40.0000 mg | CHEWABLE_TABLET | Freq: Four times a day (QID) | ORAL | Status: DC | PRN

## 2022-11-19 MED ORDER — ADULT MULTIVITAMIN W/MINERALS CH
1.0000 | ORAL_TABLET | Freq: Every day | ORAL | Status: DC
  Administered 2022-11-19 – 2022-11-20 (×2): 1 via ORAL
  Filled 2022-11-19 (×2): qty 1

## 2022-11-19 MED ORDER — ALBUTEROL SULFATE HFA 108 (90 BASE) MCG/ACT IN AERS
INHALATION_SPRAY | RESPIRATORY_TRACT | Status: DC | PRN
  Administered 2022-11-19: 6 via RESPIRATORY_TRACT

## 2022-11-19 MED ORDER — CHLORHEXIDINE GLUCONATE 0.12 % MT SOLN: 15.0000 mL | Freq: Once | OROMUCOSAL | Status: DC

## 2022-11-19 MED ORDER — LACTATED RINGERS IV SOLN: INTRAVENOUS | Status: DC

## 2022-11-19 MED ORDER — LACTATED RINGERS IV SOLN: INTRAVENOUS | Status: DC | PRN

## 2022-11-19 MED ORDER — BUDESONIDE 0.25 MG/2ML IN SUSP
0.2500 mg | Freq: Two times a day (BID) | RESPIRATORY_TRACT | Status: DC
  Administered 2022-11-19 – 2022-11-20 (×2): 0.25 mg via RESPIRATORY_TRACT
  Filled 2022-11-19 (×2): qty 2

## 2022-11-19 MED ORDER — KETOROLAC TROMETHAMINE 30 MG/ML IJ SOLN
INTRAMUSCULAR | Status: DC | PRN
  Administered 2022-11-19: 7.5 mg via INTRAVENOUS

## 2022-11-19 MED ORDER — VITAMIN B-6 25 MG PO TABS
100.0000 mg | ORAL_TABLET | Freq: Every day | ORAL | Status: DC
  Administered 2022-11-19 – 2022-11-20 (×2): 100 mg via ORAL
  Filled 2022-11-19 (×3): qty 1

## 2022-11-19 MED ORDER — ALBUTEROL SULFATE (2.5 MG/3ML) 0.083% IN NEBU: 2.5000 mg | INHALATION_SOLUTION | RESPIRATORY_TRACT | Status: DC | PRN

## 2022-11-19 MED ORDER — REVEFENACIN 175 MCG/3ML IN SOLN
175.0000 ug | Freq: Every day | RESPIRATORY_TRACT | Status: DC
  Administered 2022-11-19 – 2022-11-20 (×2): 175 ug via RESPIRATORY_TRACT
  Filled 2022-11-19 (×2): qty 3

## 2022-11-19 MED ORDER — PROPOFOL 10 MG/ML IV BOLUS
INTRAVENOUS | Status: DC | PRN
  Administered 2022-11-19: 100 mg via INTRAVENOUS

## 2022-11-19 MED ORDER — VITAMIN B-12 1000 MCG PO TABS
1000.0000 ug | ORAL_TABLET | Freq: Every day | ORAL | Status: DC
  Administered 2022-11-19 – 2022-11-20 (×2): 1000 ug via ORAL
  Filled 2022-11-19 (×2): qty 1

## 2022-11-19 MED ORDER — LIDOCAINE 2% (20 MG/ML) 5 ML SYRINGE
INTRAMUSCULAR | Status: DC | PRN
  Administered 2022-11-19: 20 mg via INTRAVENOUS

## 2022-11-19 MED ORDER — CHLORHEXIDINE GLUCONATE 0.12 % MT SOLN
OROMUCOSAL | Status: AC
  Filled 2022-11-19: qty 15

## 2022-11-19 MED ORDER — BUDESON-GLYCOPYRROL-FORMOTEROL 160-9-4.8 MCG/ACT IN AERO: 2.0000 | INHALATION_SPRAY | Freq: Two times a day (BID) | RESPIRATORY_TRACT | Status: DC

## 2022-11-19 MED ORDER — DEXAMETHASONE SODIUM PHOSPHATE 10 MG/ML IJ SOLN
INTRAMUSCULAR | Status: AC
  Filled 2022-11-19: qty 1

## 2022-11-19 MED ORDER — BUPIVACAINE HCL (PF) 0.5 % IJ SOLN
INTRAMUSCULAR | Status: AC
  Filled 2022-11-19: qty 30

## 2022-11-19 MED ORDER — BUPIVACAINE-EPINEPHRINE 0.25% -1:200000 IJ SOLN
INTRAMUSCULAR | Status: DC | PRN
  Administered 2022-11-19: 30 mL

## 2022-11-19 SURGICAL SUPPLY — 44 items
ADH SKN CLS APL DERMABOND .7 (GAUZE/BANDAGES/DRESSINGS) ×1
BLADE SURG 15 STRL LF DISP TIS (BLADE) IMPLANT
BLADE SURG 15 STRL SS (BLADE) ×1
COVER MAYO STAND XLG (MISCELLANEOUS) ×1 IMPLANT
COVER SURGICAL LIGHT HANDLE (MISCELLANEOUS) ×1 IMPLANT
COVER TIP SHEARS 8 DVNC (MISCELLANEOUS) ×1 IMPLANT
COVER TIP SHEARS 8MM DA VINCI (MISCELLANEOUS) ×1
DERMABOND ADVANCED .7 DNX12 (GAUZE/BANDAGES/DRESSINGS) ×1 IMPLANT
DRAPE ARM DVNC X/XI (DISPOSABLE) ×3 IMPLANT
DRAPE COLUMN DVNC XI (DISPOSABLE) ×1 IMPLANT
DRAPE DA VINCI XI ARM (DISPOSABLE) ×3
DRAPE DA VINCI XI COLUMN (DISPOSABLE) ×1
ELECT REM PT RETURN 9FT ADLT (ELECTROSURGICAL) ×1
ELECTRODE REM PT RTRN 9FT ADLT (ELECTROSURGICAL) ×1 IMPLANT
GAUZE SPONGE 4X4 12PLY STRL (GAUZE/BANDAGES/DRESSINGS) ×1 IMPLANT
GLOVE BIO SURGEON STRL SZ 6.5 (GLOVE) ×2 IMPLANT
GLOVE BIOGEL PI IND STRL 6.5 (GLOVE) ×2 IMPLANT
GLOVE BIOGEL PI IND STRL 7.0 (GLOVE) ×2 IMPLANT
GLOVE SURG SS PI 7.0 STRL IVOR (GLOVE) IMPLANT
GOWN STRL REUS W/TWL LRG LVL3 (GOWN DISPOSABLE) ×2 IMPLANT
KIT PINK PAD W/HEAD ARE REST (MISCELLANEOUS) ×1
KIT PINK PAD W/HEAD ARM REST (MISCELLANEOUS) ×1 IMPLANT
KIT TURNOVER KIT A (KITS) ×1 IMPLANT
MANIFOLD NEPTUNE II (INSTRUMENTS) ×1 IMPLANT
MESH 3DMAX MID 4X6 RT LRG (Mesh General) IMPLANT
NDL INSUFFLATION 14GA 120MM (NEEDLE) ×1 IMPLANT
NEEDLE INSUFFLATION 14GA 120MM (NEEDLE) ×1 IMPLANT
OBTURATOR OPTICAL STANDARD 8MM (TROCAR) ×1
OBTURATOR OPTICAL STND 8 DVNC (TROCAR) ×1
OBTURATOR OPTICALSTD 8 DVNC (TROCAR) ×1 IMPLANT
PACK LAP CHOLECYSTECTOMY (MISCELLANEOUS) ×1 IMPLANT
SEAL CANN UNIV 5-8 DVNC XI (MISCELLANEOUS) ×3 IMPLANT
SEAL XI 5MM-8MM UNIVERSAL (MISCELLANEOUS) ×3
SET TUBE SMOKE EVAC HIGH FLOW (TUBING) ×1 IMPLANT
SHEARS HARMONIC ACE PLUS 45CM (MISCELLANEOUS) IMPLANT
SOL PREP POV-IOD 4OZ 10% (MISCELLANEOUS) ×1 IMPLANT
SUT MNCRL AB 4-0 PS2 18 (SUTURE) ×1 IMPLANT
SUT V-LOC 90 ABS 3-0 VLT  V-20 (SUTURE) ×2
SUT V-LOC 90 ABS 3-0 VLT V-20 (SUTURE) ×2 IMPLANT
SUT VIC AB 2-0 CT2 27 (SUTURE) IMPLANT
TAPE TRANSPORE STRL 2 31045 (GAUZE/BANDAGES/DRESSINGS) ×1 IMPLANT
TRAY FOL W/BAG SLVR 16FR STRL (SET/KITS/TRAYS/PACK) ×1 IMPLANT
TRAY FOLEY W/BAG SLVR 16FR LF (SET/KITS/TRAYS/PACK) ×1
WATER STERILE IRR 500ML POUR (IV SOLUTION) ×1 IMPLANT

## 2022-11-19 NOTE — Op Note (Addendum)
[facility] Operative Note  11/19/22  Preoperative Diagnosis: Right recurrent inguinal Hernia   Postoperative Diagnosis: Right femoral hernia    Procedure(s) Performed: Robotic assisted laparoscopic femoral hernia repair with mesh   Surgeon: Lanell Matar. Constance Haw, MD   Assistants: No qualified resident was available    Anesthesia: General endotracheal   Anesthesiologist: Louann Sjogren, MD    Specimens: None   Estimated Blood Loss: Minimal   Blood Replacement: None    Complications: None   Wound Class:Clean    Operative Indications: The patient has a right recurrent inguinal hernia that is symptomatic and they want repaired. I reviewed his imaging and was concerned it could be a femoral hernia.  We discussed robotic assisted laparoscopic femoral/ inguinal hernia repair and risk of bleeding, infection, issues with chronic pain post operatively, use of mesh, risk of recurrence, chance of needing to repair a bilateral hernia, risk of injury to bowel or bladder, and risk of injury to cord structures for male patients.   Findings: Femoral defect noted Plug from the direct right hernia repair was intact and no recurrence  Plug from the left inguinal hernia repair intact, no recurrence  Vas Deferens and cord structures identified and preserved Bard 3D Max Medium Weight Mesh, large size Hemostasis achieved  Femoral defect    Procedure: The patient was taken to the operating room and placed supine. General endotracheal anesthesia was induced. Intravenous antibiotics were  administered per protocol.  A foley catheter was placed and a orogastric tube positioned to decompress the stomach. The abdomen was prepared and draped in the usual sterile fashion.   An incision was marked 20 cm above the pubic tubercle, slightly above the umbilicus. Veress needle inserted at this area. Saline drop test noted to be positive with gradual increase in pressure after initiation of  gas insufflation.  15 mm of pressure was achieved prior to removing the Veress needle and then placing a 8 mm port via the Optiview technique through the supraumbilical site that had been used for the Veress.  Inspection of the area afterwards noted no injury to the surrounding organs during insertion of the needle and the port.  2 port sites were marked 8 cm to the lateral sides of the initial port, and a 8 mm robotic port was placed on the left side.  I had to then use a Harmonic scalpel to get down adhesions from the anterior abdominal wall and omentum from what looked like a prior appendectomy scar. After this, I then placed another 8 mm robotic port on the right side under direct supervision. The [place] was then brought into the operative field and docked to the ports.  Examination of the abdominal cavity noted a right femoral hernia. The left sided plug was intact with no recurrence and the right sided direct hernia plug was noted and was intact without recurrence (see photo above).  A peritoneal flap was created approximately 6cm cephalad to the defect by using scissors with electrocautery.  Dissection was carried down towards the pubic tubercle, developing the myopectineal orifice view. Laterally the flap was carried towards the ASIS.  A femoral hernia sac was noted, which carefully dissected away from the adjacent tissues to be fully reduced out of hernia cavity.  Any bleeding was controlled with combination of electrocautery and manual pressure.    After confirming adequate dissection and the peritoneal reflection completely down and away from the cord structures at the deep ring, a large Bard 3DMax  Medium weight mesh was placed within the anterior abdominal wall and secured in place using 2-0 Vicryl immediately above the pubic tubercle and superior on the abdominal wall ensuring no vessels or nerves were incorporated.  After noting proper placement of the mesh with the peritoneal  reflection deep to it, the previously created peritoneal flap was secured back up to the anterior abdominal wall using running 3-0 V-Lock x2. Any holes created in the peritoneal flap were closed with redundant hernia sac locking it into place with the 3-0 V-Lock. All needles were then removed out of the abdominal cavity, Xi platform undocked from the ports and removed off of operative field.  Exparel mixed with marcaine was infused as ilioinguinal block and at the port sites.   The abdomen was then desufflated and ports removed. All skin incisions were closed with a subcuticular stitch of Monocryl 4-0. Dermabond was applied. The testis was gently pulled down into its anatomic position in the scrotum.  Final inspection revealed acceptable hemostasis. All counts were correct at the end of the case. The patient was awakened from anesthesia and extubated without complication.  The patient went to the PACU in stable condition.   Curlene Labrum, MD [facility] [address] [place], [place] [address] [phone] (office)

## 2022-11-19 NOTE — Transfer of Care (Signed)
Immediate Anesthesia Transfer of Care Note  Patient: Michael Evans  Procedure(s) Performed: HERNIA REPAIR FEMORAL WITH MESH (Right)  Patient Location: PACU  Anesthesia Type:General  Level of Consciousness: drowsy, patient cooperative, and responds to stimulation  Airway & Oxygen Therapy: Patient Spontanous Breathing and Patient connected to face mask oxygen  Post-op Assessment: Report given to RN, Post -op Vital signs reviewed and stable, Patient moving all extremities X 4, and Patient able to stick tongue midline  Post vital signs: Reviewed  Last Vitals:  Vitals Value Taken Time  BP 160/85 11/19/22 1403  Temp 98.3   Pulse 73 11/19/22 1406  Resp 18 11/19/22 1406  SpO2 99 % 11/19/22 1406  Vitals shown include unvalidated device data.  Last Pain:  Vitals:   11/19/22 0855  PainSc: 0-No pain         Complications: No notable events documented.

## 2022-11-19 NOTE — Progress Notes (Signed)
[facility]  Updated his brother. Will keep overnight. Hospitality consulted to help with management given all of his lung issues and for monitoring tonight.   Hopefully home tomorrow if doing well.  Will need to urinate in the next hour, if he has not will need to be notified.   Curlene Labrum, MD [facility] [address] [place], [place] [address] [phone] (office)

## 2022-11-19 NOTE — Consult Note (Signed)
Medical Consultation   Michael Evans  RKY:[id number]  DOB: [birth year]  DOA: 11/19/2022  PCP: [name], MD   Outpatient Specialists: Gen surgery, Dr. Constance Haw   Requesting physician: Dr. Constance Haw  Reason for consultation: Management of medical issues   History of Present Illness: Michael Evans is an 81 y.o. male with a history of COPD, previously on home oxygen at 2 L, but family reports that he was taken off of this few months ago.  Patient was electively admitted to the hospital for right femoral hernia and is status post laparoscopic hernia repair.  Hospitalist service was consulted to help manage medical issues.  Patient was seen in PACU.  He is awake and alert.  Denies any pain or shortness of breath at this time.  Reports that his breathing has been at baseline without any recent worsening, cough, fever.  He does use Breztri at home and uses albuterol as needed.  He has not had any recent nausea, vomiting.  No dysuria.  Medical history seems to include OSA, but his brother does not report any prior CPAP usage.       Review of Systems:  ROS As per HPI otherwise 10 point review of systems negative.     Past Medical History: Past Medical History:  Diagnosis Date   Arthritis    Chronic rhinitis    Complication of anesthesia    patient usually hs to be intubated with anesthesia   COPD (chronic obstructive pulmonary disease) ([place])    PFT 06-26-09 FEV1  1.75( 71%), FVC 3.65( 101%), FEV1% 48, TLC 5.53(104%), DLCO 55%, no BD   Dyspnea    Hypertension    Nasal septal deviation    Nocturia    On home O2    2L N/C   OSA (obstructive sleep apnea) 04/21/2011   Skin cancer     Past Surgical History: Past Surgical History:  Procedure Laterality Date   APPENDECTOMY     BACK SURGERY     CATARACT EXTRACTION W/PHACO  01/06/2013   Procedure: CATARACT EXTRACTION PHACO AND INTRAOCULAR LENS PLACEMENT ([place]);  Surgeon: Tonny Branch, MD;  Location: [facility];   Service: Ophthalmology;  Laterality: Right;  CDE: 22.17   CATARACT EXTRACTION W/PHACO Left 06/20/2022   Procedure: CATARACT EXTRACTION PHACO AND INTRAOCULAR LENS PLACEMENT ([facility]);  Surgeon: Baruch Goldmann, MD;  Location: [facility];  Service: Ophthalmology;  Laterality: Left;  CDE 20.85   INGUINAL HERNIA REPAIR Left 12/09/2021   Procedure: HERNIA REPAIR INGUINAL ADULT WITH MESH;  Surgeon: [name], MD;  Location: [facility];  Service: General;  Laterality: Left;   INGUINAL HERNIA REPAIR Right 03/19/2022   Procedure: HERNIA REPAIR INGUINAL ADULT WITH MESH;  Surgeon: [name], MD;  Location: [facility];  Service: General;  Laterality: Right;     Allergies:   Allergies  Allergen Reactions   Benzyl Alcohol Itching   Amlodipine Swelling    Swelling in feet   Aspirin Other (See Comments)    Upset stomach.   Other Hypertension    Hazelnuts   Penicillins Hives    Has patient had a PCN reaction causing immediate rash, facial/tongue/throat swelling, SOB or lightheadedness with hypotension: Yes Has patient had a PCN reaction causing severe rash involving mucus membranes or skin necrosis: No Has patient had a PCN reaction that required hospitalization No Has patient had a PCN reaction occurring within the last 10 years: No  If all of the above answers are "NO", then may proceed with Cephalosporin use.    Sulfa Antibiotics Hives   Tylenol [Acetaminophen] Other (See Comments)    Pt reports he has had liver issues and was told not to take tylenol      Social History:  reports that he quit smoking about 20 years ago. His smoking use included cigarettes. He has a 81.00 pack-year smoking history. He has never used smokeless tobacco. He reports that he does not drink alcohol and does not use drugs.   Family History: Family History  Problem Relation Age of Onset   Lung cancer Mother     Family history reviewed and not pertinent    Physical Exam: Vitals:   11/19/22 1445 11/19/22 1500  11/19/22 1515 11/19/22 1546  BP: 124/74 138/77 125/64 131/76  Pulse: 71 69 67 65  Resp: 17 17 (!) 21 16  Temp:    97.6 F (36.4 C)  TempSrc:    Oral  SpO2: 94% 96% 96% 98%  Weight:    58.7 kg  Height:    '5\' 5"'$  (1.651 m)    Constitutional: Alert and awake, oriented x3, not in any acute distress. Eyes: PERLA, EOMI, irises appear normal, anicteric sclera,  ENMT: external ears and nose appear normal            Lips appears normal, oropharynx mucosa, tongue, posterior pharynx appear normal  Neck: neck appears normal, no masses, normal ROM, no thyromegaly, no JVD  CVS: S1-S2 clear, no murmur rubs or gallops, no LE edema, normal pedal pulses  Respiratory:  clear to auscultation bilaterally, no wheezing, rales or rhonchi. Respiratory effort normal. No accessory muscle use.  Abdomen: soft nontender, nondistended, normal bowel sounds, no hepatosplenomegaly, no hernias  Musculoskeletal: : no cyanosis, clubbing or edema noted bilaterally                        Neuro: Cranial nerves II-XII intact, strength, sensation, reflexes Psych: judgement and insight appear normal, stable mood and affect, mental status Skin: no rashes or lesions or ulcers, no induration or nodules     Data reviewed:  I have personally reviewed following labs and imaging studies Labs:  CBC: Recent Labs  Lab 11/14/22 1102  WBC 6.8  NEUTROABS 3.6  HGB 15.3  HCT 47.7  MCV 87.8  PLT 761    Basic Metabolic Panel: No results for input(s): "NA", "K", "CL", "CO2", "GLUCOSE", "BUN", "CREATININE", "CALCIUM", "MG", "PHOS" in the last 168 hours. GFR CrCl cannot be calculated (Patient's most recent lab result is older than the maximum 21 days allowed.). Liver Function Tests: No results for input(s): "AST", "ALT", "ALKPHOS", "BILITOT", "PROT", "ALBUMIN" in the last 168 hours. No results for input(s): "LIPASE", "AMYLASE" in the last 168 hours. No results for input(s): "AMMONIA" in the last 168 hours. Coagulation profile No  results for input(s): "INR", "PROTIME" in the last 168 hours.  Cardiac Enzymes: No results for input(s): "CKTOTAL", "CKMB", "CKMBINDEX", "TROPONINI" in the last 168 hours. BNP: Invalid input(s): "POCBNP" CBG: No results for input(s): "GLUCAP" in the last 168 hours. D-Dimer No results for input(s): "DDIMER" in the last 72 hours. Hgb A1c No results for input(s): "HGBA1C" in the last 72 hours. Lipid Profile No results for input(s): "CHOL", "HDL", "LDLCALC", "TRIG", "CHOLHDL", "LDLDIRECT" in the last 72 hours. Thyroid function studies No results for input(s): "TSH", "T4TOTAL", "T3FREE", "THYROIDAB" in the last 72 hours.  Invalid input(s): "FREET3" Anemia work up No  results for input(s): "VITAMINB12", "FOLATE", "FERRITIN", "TIBC", "IRON", "RETICCTPCT" in the last 72 hours. Urinalysis    Component Value Date/Time   COLORURINE YELLOW 10/16/2022 1128   APPEARANCEUR CLEAR 10/16/2022 1128   LABSPEC 1.015 10/16/2022 1128   PHURINE 7.0 10/16/2022 1128   GLUCOSEU NEGATIVE 10/16/2022 [place] 10/16/2022 [place] 06/29/2022 1404   KETONESUR NEGATIVE 10/16/2022 1128   PROTEINUR NEGATIVE 10/16/2022 1128   NITRITE NEGATIVE 10/16/2022 1128   LEUKOCYTESUR 2+ (A) 10/16/2022 1128     Microbiology No results found for this or any previous visit (from the past 240 hour(s)).     Inpatient Medications:   Scheduled Meds:  arformoterol  15 mcg Nebulization BID   budesonide (PULMICORT) nebulizer solution  0.25 mg Nebulization BID   chlorhexidine       revefenacin  175 mcg Nebulization Daily   Continuous Infusions:   Radiological Exams on Admission: No results found.  Impression/Recommendations Principal Problem:   Femoral hernia of right side Active Problems:   Femoral hernia  Femoral hernia, right -Status post operative repair -Postop management per general surgery  COPD -Appears to be stable and at baseline -Will continue inhaled steroid, LABA.   Will also provide as needed SABA -Encourage incentive spirometry  Preop labs were reviewed and noted to be unremarkable.  Hopeful for discharge tomorrow if he remains stable.   Thank you for this consultation.  Our [facility] hospitalist team will follow the patient with you.   Time Spent: 40 mins  Kathie Dike M.D. [facility] 11/19/2022, 3:49 PM

## 2022-11-19 NOTE — Anesthesia Procedure Notes (Signed)
Procedure Name: Intubation Date/Time: 11/19/2022 11:37 AM  Performed by: Maude Leriche, CRNAPre-anesthesia Checklist: Patient identified, Emergency Drugs available, Suction available and Patient being monitored Patient Re-evaluated:Patient Re-evaluated prior to induction Oxygen Delivery Method: Circle system utilized Preoxygenation: Pre-oxygenation with 100% oxygen Induction Type: IV induction Ventilation: Mask ventilation without difficulty Laryngoscope Size: Miller and 2 Grade View: Grade I Tube type: Oral Tube size: 7.5 mm Number of attempts: 1 Placement Confirmation: ETT inserted through vocal cords under direct vision, positive ETCO2 and breath sounds checked- equal and bilateral Secured at: 23 cm Tube secured with: Tape Dental Injury: Teeth and Oropharynx as per pre-operative assessment

## 2022-11-19 NOTE — Interval H&P Note (Signed)
History and Physical Interval Note:  11/19/2022 10:55 AM  Michael Evans  has presented today for surgery, with the diagnosis of RIGHT INGUINAL HERNIA, RECURRENT.  The various methods of treatment have been discussed with the patient and family. After consideration of risks, benefits and other options for treatment, the patient has consented to  Procedure(s): XI ROBOTIC [place] (Right) as a surgical intervention.  The patient's history has been reviewed, patient examined, no change in status, stable for surgery.  I have reviewed the patient's chart and labs.  Questions were answered to the patient's satisfaction.    Right sided repair, I think this is likely a femoral. Will do bilateral repair if needed.  [name]

## 2022-11-19 NOTE — Anesthesia Preprocedure Evaluation (Signed)
Anesthesia Evaluation  Patient identified by MRN, date of birth, ID band Patient awake    Reviewed: Allergy & Precautions, H&P , NPO status , Patient's Chart, lab work & pertinent test results, reviewed documented beta blocker date and time   Airway Mallampati: II  TM Distance: >3 FB Neck ROM: full    Dental no notable dental hx.    Pulmonary shortness of breath and Long-Term Oxygen Therapy, sleep apnea , COPD,  oxygen dependent, former smoker   Pulmonary exam normal breath sounds clear to auscultation       Cardiovascular Exercise Tolerance: Good hypertension, negative cardio ROS  Rhythm:regular Rate:Normal     Neuro/Psych negative neurological ROS  negative psych ROS   GI/Hepatic negative GI ROS, Neg liver ROS,,,  Endo/Other  negative endocrine ROS    Renal/GU negative Renal ROS  negative genitourinary   Musculoskeletal   Abdominal   Peds  Hematology negative hematology ROS (+)   Anesthesia Other Findings   Reproductive/Obstetrics negative OB ROS                             Anesthesia Physical Anesthesia Plan  ASA: 3  Anesthesia Plan: General and General LMA   Post-op Pain Management:    Induction:   PONV Risk Score and Plan: Ondansetron  Airway Management Planned:   Additional Equipment:   Intra-op Plan:   Post-operative Plan:   Informed Consent: I have reviewed the patients History and Physical, chart, labs and discussed the procedure including the risks, benefits and alternatives for the proposed anesthesia with the patient or authorized representative who has indicated his/her understanding and acceptance.     Dental Advisory Given  Plan Discussed with: CRNA  Anesthesia Plan Comments:         Anesthesia Quick Evaluation

## 2022-11-20 DIAGNOSIS — Z9981 Dependence on supplemental oxygen: Secondary | ICD-10-CM | POA: Diagnosis not present

## 2022-11-20 DIAGNOSIS — J449 Chronic obstructive pulmonary disease, unspecified: Secondary | ICD-10-CM | POA: Diagnosis not present

## 2022-11-20 DIAGNOSIS — R5381 Other malaise: Secondary | ICD-10-CM | POA: Insufficient documentation

## 2022-11-20 DIAGNOSIS — Z85828 Personal history of other malignant neoplasm of skin: Secondary | ICD-10-CM | POA: Diagnosis not present

## 2022-11-20 DIAGNOSIS — Z87891 Personal history of nicotine dependence: Secondary | ICD-10-CM | POA: Diagnosis not present

## 2022-11-20 DIAGNOSIS — K4191 Unilateral femoral hernia, without obstruction or gangrene, recurrent: Secondary | ICD-10-CM | POA: Diagnosis not present

## 2022-11-20 DIAGNOSIS — I10 Essential (primary) hypertension: Secondary | ICD-10-CM | POA: Diagnosis not present

## 2022-11-20 DIAGNOSIS — K4091 Unilateral inguinal hernia, without obstruction or gangrene, recurrent: Secondary | ICD-10-CM | POA: Diagnosis not present

## 2022-11-20 LAB — CBC
HCT: 39 % (ref 39.0–52.0)
Hemoglobin: 12.7 g/dL — ABNORMAL LOW (ref 13.0–17.0)
MCH: 28.9 pg (ref 26.0–34.0)
MCHC: 32.6 g/dL (ref 30.0–36.0)
MCV: 88.8 fL (ref 80.0–100.0)
Platelets: 188 10*3/uL (ref 150–400)
RBC: 4.39 MIL/uL (ref 4.22–5.81)
RDW: 14.7 % (ref 11.5–15.5)
WBC: 12 10*3/uL — ABNORMAL HIGH (ref 4.0–10.5)
nRBC: 0 % (ref 0.0–0.2)

## 2022-11-20 LAB — BASIC METABOLIC PANEL
Anion gap: 8 (ref 5–15)
BUN: 28 mg/dL — ABNORMAL HIGH (ref 8–23)
CO2: 27 mmol/L (ref 22–32)
Calcium: 9.3 mg/dL (ref 8.9–10.3)
Chloride: 105 mmol/L (ref 98–111)
Creatinine, Ser: 1.11 mg/dL (ref 0.61–1.24)
GFR, Estimated: >60 mL/min (ref >60)
Glucose, Bld: 112 mg/dL — ABNORMAL HIGH (ref 70–99)
Potassium: 4.2 mmol/L (ref 3.5–5.1)
Sodium: 140 mmol/L (ref 135–145)

## 2022-11-20 LAB — MRSA NEXT GEN BY PCR, NASAL: MRSA by PCR Next Gen: NOT DETECTED

## 2022-11-20 MED ORDER — ONDANSETRON 4 MG PO TBDP: 4.0000 mg | ORAL_TABLET | Freq: Four times a day (QID) | ORAL | 0 refills | Status: DC | PRN

## 2022-11-20 MED ORDER — CHLORHEXIDINE GLUCONATE CLOTH 2 % EX PADS
6.0000 | MEDICATED_PAD | Freq: Every day | CUTANEOUS | Status: DC
  Administered 2022-11-20: 6 via TOPICAL

## 2022-11-20 MED ORDER — OXYCODONE HCL 5 MG PO TABS: 5.0000 mg | ORAL_TABLET | ORAL | 0 refills | Status: DC | PRN

## 2022-11-20 NOTE — Discharge Summary (Signed)
Physician Discharge Summary  Patient ID: Michael Evans MRN: [id number] DOB/AGE: 83-Oct-1942 83 y.o.  Admit date: 11/19/2022 Discharge date: 11/20/2022  Admission Diagnoses: Right inguinal hernia   Discharge Diagnoses:  Principal Problem:   Femoral hernia of right side Active Problems:   Femoral hernia   Physical deconditioning   Discharged Condition: good  Hospital Course: Michael Evans stayed overnight after his femoral hernia repair. He did well and had no issues with pain. He was having Bms and eating. He had not come to the hospital with his normal home O2 as he reported not being able to afford it. He was evaluated and he does need O2. He was then set up for new O2 at home before discharge.   Consults:  Hospitalist helping with medical issues  Significant Diagnostic Studies: minor leukocytosis post op   Latest Reference Range & Units 11/20/22 04:14  Sodium 135 - 145 mmol/L 140  Potassium 3.5 - 5.1 mmol/L 4.2  Chloride 98 - 111 mmol/L 105  CO2 22 - 32 mmol/L 27  Glucose 70 - 99 mg/dL 112 (H)  BUN 8 - 23 mg/dL 28 (H)  Creatinine 0.61 - 1.24 mg/dL 1.11  Calcium 8.9 - 10.3 mg/dL 9.3  Anion gap 5 - 15  8  GFR, Estimated >60 mL/min >60  WBC 4.0 - 10.5 K/uL 12.0 (H)  RBC 4.22 - 5.81 MIL/uL 4.39  Hemoglobin 13.0 - 17.0 g/dL 12.7 (L)  HCT 39.0 - 52.0 % 39.0  MCV 80.0 - 100.0 fL 88.8  MCH 26.0 - 34.0 pg 28.9  MCHC 30.0 - 36.0 g/dL 32.6  RDW 11.5 - 15.5 % 14.7  Platelets 150 - 400 K/uL 188  nRBC 0.0 - 0.2 % 0.0  (H): Data is abnormally high (L): Data is abnormally low Treatments: IV hydration and Robotic assisted femoral hernia repair with mesh 11/19/22  Discharge Exam: Blood pressure (!) 133/58, pulse 69, temperature 98.2 F (36.8 C), temperature source Oral, resp. rate (!) 21, height _0  (1.651 m), weight 58.7 kg, SpO2 100 %.   Disposition: Discharge disposition: 01-Home or Self Care       Discharge Instructions     Call MD for:  difficulty breathing, headache  or visual disturbances   Complete by: As directed    Call MD for:  extreme fatigue   Complete by: As directed    Call MD for:  persistant dizziness or light-headedness   Complete by: As directed    Call MD for:  persistant nausea and vomiting   Complete by: As directed    Call MD for:  redness, tenderness, or signs of infection (pain, swelling, redness, odor or green/yellow discharge around incision site)   Complete by: As directed    Call MD for:  severe uncontrolled pain   Complete by: As directed    Call MD for:  temperature >100.4   Complete by: As directed    Face-to-face encounter (required for Medicare/Medicaid patients)   Complete by: As directed    [name] certify that this patient is under my care and that I, or a nurse practitioner or physician's assistant working with me, had a face-to-face encounter that meets the physician face-to-face encounter requirements with this patient on 11/20/2022. The encounter with the patient was in whole, or in part for the following medical condition(s) which is the primary reason for home health care (List medical condition): femoral hernia repair, deconditioning, on home O2   The encounter with the patient was in  whole, or in part, for the following medical condition, which is the primary reason for home health care: femoral hernia repair, deconditioning   I certify that, based on my findings, the following services are medically necessary home health services: Physical therapy   Reason for Medically Necessary Home Health Services:  Therapy- Personnel officer, Public librarian Therapy- Therapeutic Exercises to Increase Strength and Endurance     My clinical findings support the need for the above services: Shortness of breath with activity   Further, I certify that my clinical findings support that this patient is homebound due to: Pain interferes with ambulation/mobility   Home Health   Complete by: As directed     To provide the following care/treatments: PT   Increase activity slowly   Complete by: As directed       Allergies as of 11/20/2022       Reactions   Benzyl Alcohol Itching   Amlodipine Swelling   Swelling in feet   Aspirin Other (See Comments)   Upset stomach.   Other Hypertension   Hazelnuts   Penicillins Hives   Has patient had a PCN reaction causing immediate rash, facial/tongue/throat swelling, SOB or lightheadedness with hypotension: Yes Has patient had a PCN reaction causing severe rash involving mucus membranes or skin necrosis: No Has patient had a PCN reaction that required hospitalization No Has patient had a PCN reaction occurring within the last 10 years: No If all of the above answers are "NO", then may proceed with Cephalosporin use.   Sulfa Antibiotics Hives   Tylenol [acetaminophen] Other (See Comments)   Pt reports he has had liver issues and was told not to take tylenol         Medication List     TAKE these medications    albuterol 108 (90 Base) MCG/ACT inhaler Commonly known as: VENTOLIN HFA INHALE 1-2 PUFFS BY MOUTH EVERY 6 HOURS AS NEEDED FOR WHEEZE OR SHORTNESS OF BREATH What changed: See the new instructions.   Breztri Aerosphere 160-9-4.8 MCG/ACT Aero Generic drug: Budeson-Glycopyrrol-Formoterol Inhale 2 puffs into the lungs in the morning and at bedtime.   cholecalciferol 25 MCG (1000 UNIT) tablet Commonly known as: VITAMIN D3 Take 3,000 Units by mouth daily.   cyanocobalamin 2000 MCG tablet Take 1,000 mcg by mouth daily.   FISH OIL CONCENTRATE PO Take 1,000 mg by mouth daily.   multivitamin tablet Take 1 tablet by mouth daily. Men's health   Nebulizer Misc 1 each by Does not apply route daily as needed. PLEASE PROVIDE NEBULIZER KIT TO INCLUDE TUBING/MASK/MOUTHPIECE   ondansetron 4 MG disintegrating tablet Commonly known as: ZOFRAN-ODT Take 1 tablet (4 mg total) by mouth every 6 (six) hours as needed for nausea.   oxyCODONE 5  MG immediate release tablet Commonly known as: Oxy IR/ROXICODONE Take 1-2 tablets (5-10 mg total) by mouth every 4 (four) hours as needed for severe pain or breakthrough pain. What changed: how much to take   pyridoxine 100 MG tablet Commonly known as: B-6 Take 100 mg by mouth daily.   triamcinolone cream 0.1 % Commonly known as: KENALOG Apply 1 Application topically 2 (two) times daily.   vitamin C 1000 MG tablet Take 1,000 mg by mouth daily.               Durable Medical Equipment  (From admission, onward)           Start     Ordered   11/20/22 1045  For home  use only DME oxygen  Once       Question Answer Comment  Length of Need Lifetime   Mode or (Route) Nasal cannula   Liters per Minute 2   Frequency Continuous (stationary and portable oxygen unit needed)   Oxygen conserving device Yes   Oxygen delivery system Gas      11/20/22 1046            Follow-up Information     [name], MD Follow up in 4 week(s).   Specialty: General Surgery Contact information: [address] [place] [address] [phone]                 Signed: [name] 11/20/2022, 1:17 PM

## 2022-11-20 NOTE — Progress Notes (Signed)
Per Dr. Constance Haw, walk test completed. Pt walked 2 laps around the unit. He dasated as low as 68% on room air but recovered to 80% on room air. Pt is back on O2 [place] sating at 92%.   Dr. Roderic Palau and Constance Haw updated.

## 2022-11-20 NOTE — Progress Notes (Signed)
Incentive spirometer given to pt with teachback method and return demonstration successful. Discharge paperwork reviewed with pt. Pt verbalized understanding and no further questions at this time. Pt's IVs removed, dressed and wheeled to lobby awaiting ride home.

## 2022-11-20 NOTE — Progress Notes (Signed)
SATURATION QUALIFICATIONS: (This note is used to comply with regulatory documentation for home oxygen)  Patient Saturations on Room Air at Rest = 80%  Patient Saturations on Room Air while Ambulating = 68%  Patient Saturations on 2 Liters of oxygen while Ambulating = 92%  Please briefly explain why patient needs home oxygen: hypoxia on room air.

## 2022-11-20 NOTE — Progress Notes (Signed)
[facility]  Having some soreness but nothing major. Says the sausage did not sit right with him this Am but no other nausea or  vomiting. Reports a BM this AM.  BP (!) 110/46   Pulse 71   Temp 97.8 F (36.6 C) (Oral)   Resp (!) 23   Ht '5\' 5"'$  (1.651 m)   Wt 58.7 kg   SpO2 96%   BMI 21.53 kg/m  Incision c/d/I with dermabond, no bruising in the pelvis, minimally tender in the abdomen and in the groin area  Patient s/p robotic assisted right femoral hernia repair with mesh. Doing well overall. Is on O2 currently and reports not having any at home. Hospitalist also following. Discussed O2 walk test and see if social work needs to be involved if he needs the O2 as he reports insurance stopped paying for the O2.  Reviewed labs- minor drift in H&H, no signs of bleeding Mild leukocytosis to 12, reactive post op, no fevers  Electrolytes looking wnl  Once he has completed the walk test will hopefully be able to get him home. He will need to call his brother. Updated team.  Curlene Labrum, MD [facility] [address] [place], [place] [address] [phone] (office)

## 2022-11-20 NOTE — TOC Transition Note (Signed)
Transition of Care [facility]) - CM/SW Discharge Note   Patient Details  Name: LENELL LAMA MRN: [id number] Date of Birth: 11/14/1941  Transition of [facility]) CM/SW Contact:  Iona Beard, [place] Phone Number: 11/20/2022, 1:03 PM   Clinical Narrative:    TOC updated that pt will need home O2 at D/C. CSW met with pt at bedside about home O2 being set up. Pt does not have a company preference. CSW spoke to [place] with [employer] who accepts referral and delivered an O2 tank to the pts room. CSW inquired about interest in [facility] PT at D/C. Pt states he is interested and does not have an agency preference. CSW spoke to [place] with [facility] who accepts [facility] PT referral. CSW requested that MD place [facility] PT only orders. TOC signing off.   Final next level of care: [place] Barriers to Discharge: Barriers Resolved   Patient Goals and CMS Choice Patient states their goals for this hospitalization and ongoing recovery are:: return home CMS Medicare.gov Compare Post Acute Care list provided to:: Patient Choice offered to / list presented to : Patient    Discharge Placement                       Discharge Plan and Services                DME Arranged: Oxygen DME Agency: [employer] Date DME Agency Contacted: 11/20/22   Representative spoke with at DME Agency: Caryl Pina [facility] Arranged: PT [place]: [place] Date [place]: 11/20/22   Representative spoke with at [place]: Georgina Snell  Social Determinants of Health ([place]) Interventions     Readmission Risk Interventions     No data to display

## 2022-11-20 NOTE — Discharge Instructions (Signed)
Discharge Robotic Assisted Laparoscopic Surgery Instructions:  Common Complaints: Right shoulder pain is common after laparoscopic surgery.  This is secondary to the gas used in the surgery being trapped under the diaphragm.  Walk to help your body absorb the gas. This will improve in a few days. Pain at the port sites are common, especially the larger port sites. This will improve with time.  Some nausea is common and poor appetite. The main goal is to stay hydrated the first few days after surgery.  You may have some swelling and pain in your groin region as well as bruising. You can ice this and it may help some.   Diet/ Activity: Diet as tolerated. You may not have an appetite, but it is important to stay hydrated.  Drink 64 ounces of water a day. Your appetite will return with time.  Shower per your regular routine daily.  Do not take hot showers. Take warm showers that are less than 10 minutes. Rest and listen to your body, but do not remain in bed all day.  Walk everyday for at least 15-20 minutes. Deep cough and move around every 1-2 hours in the first few days after surgery.  Do not lift > 10 lbs, perform excessive bending, pushing, pulling, squatting for 4 weeks after surgery.  Do not pick at the dermabond glue on your incision sites.  This glue film will remain in place for 1-2 weeks and will start to peel off.  Do not place lotions or balms on your incision unless instructed to specifically by Dr. Constance Haw.   Pain Expectations and Narcotics: -After surgery you will have pain associated with your incisions and this is normal. The pain is muscular and nerve pain, and will get better with time. -You are encouraged and expected to take non narcotic medications like tylenol and ibuprofen (when able) to treat pain as multiple modalities can aid with pain treatment. -Narcotics are only used when pain is severe or there is breakthrough pain. -You are not expected to have a pain score of 0  after surgery, as we cannot prevent pain. A pain score of 3-4 that allows you to be functional, move, walk, and tolerate some activity is the goal. The pain will continue to improve over the days after surgery and is dependent on your surgery. -Due to [place] law, we are only able to give a certain amount of pain medication to treat post operative pain, and we only give additional narcotics on a patient by patient basis.  -For most laparoscopic surgery, studies have shown that the majority of patients only need 10-15 narcotic pills, and for open surgeries most patients only need 15-20.   -Having appropriate expectations of pain and knowledge of pain management with non narcotics is important as we do not want anyone to become addicted to narcotic pain medication.  -Using ice packs in the first 48 hours and heating pads after 48 hours, wearing an abdominal binder (when recommended), and using over the counter medications are all ways to help with pain management.   -Simple acts like meditation and mindfulness practices after surgery can also help with pain control and research has proven the benefit of these practices.  Medication: Take tylenol and ibuprofen as needed for pain control, alternating every 4-6 hours.  Example:  Tylenol '1000mg'$  @ 6am, 12noon, 6pm, 92mdnight (Do not exceed '4000mg'$  of tylenol a day). Ibuprofen '800mg'$  @ 9am, 3pm, 9pm, 3am (Do not exceed '3600mg'$  of ibuprofen a day).  Take Roxicodone  for breakthrough pain every 4 hours.  Take Colace for constipation related to narcotic pain medication. If you do not have a bowel movement in 2 days, take Miralax over the counter.  Drink plenty of water to also prevent constipation.   Contact Information: If you have questions or concerns, please call our office, [phone], Monday- Thursday 8AM-5PM and Friday 8AM-12Noon.  If it is after hours or on the weekend, please call [facility] Main Number, [phone], [phone], and ask to speak to the  surgeon on call for Dr. Constance Haw at [facility].

## 2022-11-21 NOTE — Anesthesia Postprocedure Evaluation (Signed)
Anesthesia Post Note  Patient: Michael Evans  Procedure(s) Performed: HERNIA REPAIR FEMORAL WITH MESH (Right)  Patient location during evaluation: Phase II Anesthesia Type: General Level of consciousness: awake Pain management: pain level controlled Vital Signs Assessment: post-procedure vital signs reviewed and stable Respiratory status: spontaneous breathing and respiratory function stable Cardiovascular status: blood pressure returned to baseline and stable Postop Assessment: no headache and no apparent nausea or vomiting Anesthetic complications: no Comments: Late entry   No notable events documented.   Last Vitals:  Vitals:   11/20/22 1124 11/20/22 1200  BP:  (!) 133/58  Pulse:  69  Resp:  (!) 21  Temp: 36.8 C   SpO2:  100%    Last Pain:  Vitals:   11/20/22 1200  TempSrc:   PainSc: 0-No pain                 Louann Sjogren

## 2022-12-01 DIAGNOSIS — J441 Chronic obstructive pulmonary disease with (acute) exacerbation: Secondary | ICD-10-CM | POA: Diagnosis not present

## 2022-12-01 DIAGNOSIS — J449 Chronic obstructive pulmonary disease, unspecified: Secondary | ICD-10-CM | POA: Diagnosis not present

## 2022-12-02 ENCOUNTER — Encounter (HOSPITAL_COMMUNITY): Payer: Self-pay | Admitting: General Surgery

## 2022-12-02 DIAGNOSIS — M1990 Unspecified osteoarthritis, unspecified site: Secondary | ICD-10-CM | POA: Diagnosis not present

## 2022-12-02 DIAGNOSIS — Z7951 Long term (current) use of inhaled steroids: Secondary | ICD-10-CM | POA: Diagnosis not present

## 2022-12-02 DIAGNOSIS — G4733 Obstructive sleep apnea (adult) (pediatric): Secondary | ICD-10-CM | POA: Diagnosis not present

## 2022-12-02 DIAGNOSIS — Z85828 Personal history of other malignant neoplasm of skin: Secondary | ICD-10-CM | POA: Diagnosis not present

## 2022-12-02 DIAGNOSIS — I700 Atherosclerosis of aorta: Secondary | ICD-10-CM | POA: Diagnosis not present

## 2022-12-02 DIAGNOSIS — Z9981 Dependence on supplemental oxygen: Secondary | ICD-10-CM | POA: Diagnosis not present

## 2022-12-02 DIAGNOSIS — J439 Emphysema, unspecified: Secondary | ICD-10-CM | POA: Diagnosis not present

## 2022-12-02 DIAGNOSIS — Z48815 Encounter for surgical aftercare following surgery on the digestive system: Secondary | ICD-10-CM | POA: Diagnosis not present

## 2022-12-02 DIAGNOSIS — Z9181 History of falling: Secondary | ICD-10-CM | POA: Diagnosis not present

## 2022-12-02 DIAGNOSIS — I10 Essential (primary) hypertension: Secondary | ICD-10-CM | POA: Diagnosis not present

## 2022-12-02 DIAGNOSIS — Z87891 Personal history of nicotine dependence: Secondary | ICD-10-CM | POA: Diagnosis not present

## 2022-12-02 DIAGNOSIS — J449 Chronic obstructive pulmonary disease, unspecified: Secondary | ICD-10-CM | POA: Diagnosis not present

## 2022-12-02 DIAGNOSIS — J342 Deviated nasal septum: Secondary | ICD-10-CM | POA: Diagnosis not present

## 2022-12-05 DIAGNOSIS — Z87891 Personal history of nicotine dependence: Secondary | ICD-10-CM | POA: Diagnosis not present

## 2022-12-05 DIAGNOSIS — J439 Emphysema, unspecified: Secondary | ICD-10-CM | POA: Diagnosis not present

## 2022-12-05 DIAGNOSIS — M1990 Unspecified osteoarthritis, unspecified site: Secondary | ICD-10-CM | POA: Diagnosis not present

## 2022-12-05 DIAGNOSIS — Z9981 Dependence on supplemental oxygen: Secondary | ICD-10-CM | POA: Diagnosis not present

## 2022-12-05 DIAGNOSIS — Z9181 History of falling: Secondary | ICD-10-CM | POA: Diagnosis not present

## 2022-12-05 DIAGNOSIS — Z7951 Long term (current) use of inhaled steroids: Secondary | ICD-10-CM | POA: Diagnosis not present

## 2022-12-05 DIAGNOSIS — Z48815 Encounter for surgical aftercare following surgery on the digestive system: Secondary | ICD-10-CM | POA: Diagnosis not present

## 2022-12-05 DIAGNOSIS — I700 Atherosclerosis of aorta: Secondary | ICD-10-CM | POA: Diagnosis not present

## 2022-12-05 DIAGNOSIS — J342 Deviated nasal septum: Secondary | ICD-10-CM | POA: Diagnosis not present

## 2022-12-05 DIAGNOSIS — J449 Chronic obstructive pulmonary disease, unspecified: Secondary | ICD-10-CM | POA: Diagnosis not present

## 2022-12-05 DIAGNOSIS — Z85828 Personal history of other malignant neoplasm of skin: Secondary | ICD-10-CM | POA: Diagnosis not present

## 2022-12-05 DIAGNOSIS — G4733 Obstructive sleep apnea (adult) (pediatric): Secondary | ICD-10-CM | POA: Diagnosis not present

## 2022-12-05 DIAGNOSIS — I10 Essential (primary) hypertension: Secondary | ICD-10-CM | POA: Diagnosis not present

## 2022-12-08 DIAGNOSIS — Z85828 Personal history of other malignant neoplasm of skin: Secondary | ICD-10-CM | POA: Diagnosis not present

## 2022-12-08 DIAGNOSIS — G4733 Obstructive sleep apnea (adult) (pediatric): Secondary | ICD-10-CM | POA: Diagnosis not present

## 2022-12-08 DIAGNOSIS — Z9981 Dependence on supplemental oxygen: Secondary | ICD-10-CM | POA: Diagnosis not present

## 2022-12-08 DIAGNOSIS — J449 Chronic obstructive pulmonary disease, unspecified: Secondary | ICD-10-CM | POA: Diagnosis not present

## 2022-12-08 DIAGNOSIS — Z7951 Long term (current) use of inhaled steroids: Secondary | ICD-10-CM | POA: Diagnosis not present

## 2022-12-08 DIAGNOSIS — J342 Deviated nasal septum: Secondary | ICD-10-CM | POA: Diagnosis not present

## 2022-12-08 DIAGNOSIS — M1990 Unspecified osteoarthritis, unspecified site: Secondary | ICD-10-CM | POA: Diagnosis not present

## 2022-12-08 DIAGNOSIS — I700 Atherosclerosis of aorta: Secondary | ICD-10-CM | POA: Diagnosis not present

## 2022-12-08 DIAGNOSIS — Z9181 History of falling: Secondary | ICD-10-CM | POA: Diagnosis not present

## 2022-12-08 DIAGNOSIS — I10 Essential (primary) hypertension: Secondary | ICD-10-CM | POA: Diagnosis not present

## 2022-12-08 DIAGNOSIS — Z87891 Personal history of nicotine dependence: Secondary | ICD-10-CM | POA: Diagnosis not present

## 2022-12-08 DIAGNOSIS — J439 Emphysema, unspecified: Secondary | ICD-10-CM | POA: Diagnosis not present

## 2022-12-08 DIAGNOSIS — Z48815 Encounter for surgical aftercare following surgery on the digestive system: Secondary | ICD-10-CM | POA: Diagnosis not present

## 2022-12-09 ENCOUNTER — Encounter: Payer: Self-pay | Admitting: Family Medicine

## 2022-12-09 ENCOUNTER — Ambulatory Visit (INDEPENDENT_AMBULATORY_CARE_PROVIDER_SITE_OTHER): Payer: 59 | Admitting: Family Medicine

## 2022-12-09 VITALS — BP 126/72 | HR 61 | Temp 98.8°F | Ht 65.0 in | Wt 126.0 lb

## 2022-12-09 DIAGNOSIS — Z85828 Personal history of other malignant neoplasm of skin: Secondary | ICD-10-CM | POA: Diagnosis not present

## 2022-12-09 DIAGNOSIS — J441 Chronic obstructive pulmonary disease with (acute) exacerbation: Secondary | ICD-10-CM | POA: Diagnosis not present

## 2022-12-09 DIAGNOSIS — G4733 Obstructive sleep apnea (adult) (pediatric): Secondary | ICD-10-CM | POA: Diagnosis not present

## 2022-12-09 DIAGNOSIS — I10 Essential (primary) hypertension: Secondary | ICD-10-CM | POA: Diagnosis not present

## 2022-12-09 DIAGNOSIS — Z48815 Encounter for surgical aftercare following surgery on the digestive system: Secondary | ICD-10-CM | POA: Diagnosis not present

## 2022-12-09 DIAGNOSIS — J449 Chronic obstructive pulmonary disease, unspecified: Secondary | ICD-10-CM | POA: Diagnosis not present

## 2022-12-09 DIAGNOSIS — J439 Emphysema, unspecified: Secondary | ICD-10-CM | POA: Diagnosis not present

## 2022-12-09 DIAGNOSIS — Z7951 Long term (current) use of inhaled steroids: Secondary | ICD-10-CM | POA: Diagnosis not present

## 2022-12-09 DIAGNOSIS — J342 Deviated nasal septum: Secondary | ICD-10-CM | POA: Diagnosis not present

## 2022-12-09 DIAGNOSIS — Z9181 History of falling: Secondary | ICD-10-CM | POA: Diagnosis not present

## 2022-12-09 DIAGNOSIS — I700 Atherosclerosis of aorta: Secondary | ICD-10-CM | POA: Diagnosis not present

## 2022-12-09 DIAGNOSIS — Z87891 Personal history of nicotine dependence: Secondary | ICD-10-CM | POA: Diagnosis not present

## 2022-12-09 DIAGNOSIS — Z9981 Dependence on supplemental oxygen: Secondary | ICD-10-CM | POA: Diagnosis not present

## 2022-12-09 DIAGNOSIS — M1990 Unspecified osteoarthritis, unspecified site: Secondary | ICD-10-CM | POA: Diagnosis not present

## 2022-12-09 MED ORDER — PREDNISONE 20 MG PO TABS: ORAL_TABLET | ORAL | 0 refills | Status: DC

## 2022-12-09 MED ORDER — AZITHROMYCIN 250 MG PO TABS: ORAL_TABLET | ORAL | 0 refills | Status: DC

## 2022-12-09 MED ORDER — BREZTRI AEROSPHERE 160-9-4.8 MCG/ACT IN AERO: 2.0000 | INHALATION_SPRAY | Freq: Two times a day (BID) | RESPIRATORY_TRACT | 11 refills | Status: AC

## 2022-12-09 NOTE — Progress Notes (Signed)
Subjective:    Patient ID: Michael Evans, male    DOB: Mar 27, 1941, 82 y.o.   MRN: [id number] Patient is an 82 year old Caucasian gentleman with a history of COPD.  He is supposed to be on Breztri, 2 puffs twice daily.  However he states that he has not been taking his medication.  He states that his breathing has been gradually getting worse for quite some time.  He is unable to quantify exactly how long or when it started getting worse.  Today he has rhonchorous breath sounds throughout his lungs and diminished breath sounds throughout.  He reports sputum production but he denies any blood or purulent sputum.  He denies any fevers or chills or chest pain or pleurisy or hemoptysis. Past Medical History:  Diagnosis Date  . Arthritis   . Chronic rhinitis   . Complication of anesthesia    patient usually hs to be intubated with anesthesia  . COPD (chronic obstructive pulmonary disease) (HCC)    PFT 06-26-09 FEV1  1.75( 71%), FVC 3.65( 101%), FEV1% 48, TLC 5.53(104%), DLCO 55%, no BD  . Dyspnea   . Hypertension   . Nasal septal deviation   . Nocturia   . On home O2    2L N/C  . OSA (obstructive sleep apnea) 04/21/2011  . Skin cancer    Past Surgical History:  Procedure Laterality Date  . APPENDECTOMY    . BACK SURGERY    . CATARACT EXTRACTION W/PHACO  01/06/2013   Procedure: CATARACT EXTRACTION PHACO AND INTRAOCULAR LENS PLACEMENT (IOC);  Surgeon: Tonny Branch, MD;  Location: [facility];  Service: Ophthalmology;  Laterality: Right;  CDE: 22.17  . CATARACT EXTRACTION W/PHACO Left 06/20/2022   Procedure: CATARACT EXTRACTION PHACO AND INTRAOCULAR LENS PLACEMENT (IOC);  Surgeon: Baruch Goldmann, MD;  Location: [facility];  Service: Ophthalmology;  Laterality: Left;  CDE 20.85  . FEMORAL HERNIA REPAIR Right 11/19/2022   Procedure: HERNIA REPAIR FEMORAL WITH MESH;  Surgeon: [name], MD;  Location: [facility];  Service: General;  Laterality: Right;  . INGUINAL HERNIA REPAIR Left 12/09/2021   Procedure:  HERNIA REPAIR INGUINAL ADULT WITH MESH;  Surgeon: [name], MD;  Location: [facility];  Service: General;  Laterality: Left;  . INGUINAL HERNIA REPAIR Right 03/19/2022   Procedure: HERNIA REPAIR INGUINAL ADULT WITH MESH;  Surgeon: [name], MD;  Location: [facility];  Service: General;  Laterality: Right;   Current Outpatient Medications on File Prior to Visit  Medication Sig Dispense Refill  . albuterol (VENTOLIN HFA) 108 (90 Base) MCG/ACT inhaler INHALE 1-2 PUFFS BY MOUTH EVERY 6 HOURS AS NEEDED FOR WHEEZE OR SHORTNESS OF BREATH (Patient taking differently: Inhale 2 puffs into the lungs every 6 (six) hours as needed for wheezing or shortness of breath.) 6.7 each 3  . Ascorbic Acid (VITAMIN C) 1000 MG tablet Take 1,000 mg by mouth daily.    . cholecalciferol (VITAMIN D) 25 MCG (1000 UNIT) tablet Take 3,000 Units by mouth daily.    . cyanocobalamin 2000 MCG tablet Take 1,000 mcg by mouth daily.    . Multiple Vitamin (MULTIVITAMIN) tablet Take 1 tablet by mouth daily. Men's health    . Nebulizer MISC 1 each by Does not apply route daily as needed. PLEASE PROVIDE NEBULIZER KIT TO INCLUDE TUBING/MASK/MOUTHPIECE 1 each 3  . Omega-3 Fatty Acids (FISH OIL CONCENTRATE PO) Take 1,000 mg by mouth daily.    . ondansetron (ZOFRAN-ODT) 4 MG disintegrating tablet Take 1 tablet (4 mg  total) by mouth every 6 (six) hours as needed for nausea. 20 tablet 0  . pyridoxine (B-6) 100 MG tablet Take 100 mg by mouth daily.    Marland Kitchen triamcinolone cream (KENALOG) 0.1 % Apply 1 Application topically 2 (two) times daily. 30 g 0   No current facility-administered medications on file prior to visit.   Allergies  Allergen Reactions  . Benzyl Alcohol Itching  . Amlodipine Swelling    Swelling in feet  . Aspirin Other (See Comments)    Upset stomach.  . Other Hypertension    Hazelnuts  . Penicillins Hives    Has patient had a PCN reaction causing immediate rash, facial/tongue/throat swelling, SOB or  lightheadedness with hypotension: Yes Has patient had a PCN reaction causing severe rash involving mucus membranes or skin necrosis: No Has patient had a PCN reaction that required hospitalization No Has patient had a PCN reaction occurring within the last 10 years: No If all of the above answers are "NO", then may proceed with Cephalosporin use.   . Sulfa Antibiotics Hives  . Tylenol [Acetaminophen] Other (See Comments)    Pt reports he has had liver issues and was told not to take tylenol    Social History   Socioeconomic History  . Marital status: Divorced    Spouse name: Not on file  . Number of children: 3  . Years of education: Not on file  . Highest education level: Not on file  Occupational History  . Occupation: Neurosurgeon: RETIRED  Tobacco Use  . Smoking status: Former    Packs/day: 1.50    Years: 54.00    Total pack years: 81.00    Types: Cigarettes    Quit date: 12/01/2001    Years since quitting: 21.0  . Smokeless tobacco: Never  Vaping Use  . Vaping Use: Never used  Substance and Sexual Activity  . Alcohol use: No  . Drug use: No  . Sexual activity: Not on file  Other Topics Concern  . Not on file  Social History Narrative   Lives with sister.    Social Determinants of Health   Financial Resource Strain: Low Risk  (02/07/2022)   Overall Financial Resource Strain (CARDIA)   . Difficulty of Paying Living Expenses: Not hard at all  Food Insecurity: No Food Insecurity (11/19/2022)   Hunger Vital Sign   . Worried About Charity fundraiser in the Last Year: Never true   . Ran Out of Food in the Last Year: Never true  Transportation Needs: No Transportation Needs (11/19/2022)   PRAPARE - Transportation   . Lack of Transportation (Medical): No   . Lack of Transportation (Non-Medical): No  Physical Activity: Insufficiently Active (02/07/2022)   Exercise Vital Sign   . Days of Exercise per Week: 3 days   . Minutes of Exercise per Session: 20 min   Stress: No Stress Concern Present (02/07/2022)   [place]   . Feeling of Stress : Not at all  Social Connections: Socially Isolated (02/07/2022)   Social Connection and Isolation Panel [NHANES]   . Frequency of Communication with Friends and Family: More than three times a week   . Frequency of Social Gatherings with Friends and Family: More than three times a week   . Attends Religious Services: Never   . Active Member of Clubs or Organizations: No   . Attends Archivist Meetings: Never   . Marital  Status: Divorced  Intimate Partner Violence: Not At Risk (11/19/2022)   Humiliation, Afraid, Rape, and Kick questionnaire   . Fear of Current or Ex-Partner: No   . Emotionally Abused: No   . Physically Abused: No   . Sexually Abused: No     Review of Systems  All other systems reviewed and are negative.      Objective:   Physical Exam Constitutional:      General: He is not in acute distress.    Appearance: Normal appearance. He is not ill-appearing or toxic-appearing.  Cardiovascular:     Rate and Rhythm: Normal rate and regular rhythm.     Heart sounds: Normal heart sounds. No murmur heard.    No friction rub. No gallop.  Pulmonary:     Effort: Pulmonary effort is normal. No tachypnea.     Breath sounds: Decreased air movement present. Decreased breath sounds, wheezing and rhonchi present. No rales.  Neurological:     Mental Status: He is alert.          Assessment & Plan:  COPD exacerbation ([place]) I believe the patient is having a mild COPD exacerbation brought on by stopping his medication.  Resume Breztri 2 inhalations twice daily.  I gave him samples of this today.  Also begin a prednisone taper pack due to diminished breath sounds expiratory wheezing and increased sputum production along with a Z-Pak.  Reassess in 1 week if no better or sooner if worse.

## 2022-12-11 DIAGNOSIS — Z87891 Personal history of nicotine dependence: Secondary | ICD-10-CM | POA: Diagnosis not present

## 2022-12-11 DIAGNOSIS — I10 Essential (primary) hypertension: Secondary | ICD-10-CM | POA: Diagnosis not present

## 2022-12-11 DIAGNOSIS — Z48815 Encounter for surgical aftercare following surgery on the digestive system: Secondary | ICD-10-CM | POA: Diagnosis not present

## 2022-12-11 DIAGNOSIS — Z85828 Personal history of other malignant neoplasm of skin: Secondary | ICD-10-CM | POA: Diagnosis not present

## 2022-12-11 DIAGNOSIS — Z7951 Long term (current) use of inhaled steroids: Secondary | ICD-10-CM | POA: Diagnosis not present

## 2022-12-11 DIAGNOSIS — J439 Emphysema, unspecified: Secondary | ICD-10-CM | POA: Diagnosis not present

## 2022-12-11 DIAGNOSIS — G4733 Obstructive sleep apnea (adult) (pediatric): Secondary | ICD-10-CM | POA: Diagnosis not present

## 2022-12-11 DIAGNOSIS — Z9181 History of falling: Secondary | ICD-10-CM | POA: Diagnosis not present

## 2022-12-11 DIAGNOSIS — Z9981 Dependence on supplemental oxygen: Secondary | ICD-10-CM | POA: Diagnosis not present

## 2022-12-11 DIAGNOSIS — I700 Atherosclerosis of aorta: Secondary | ICD-10-CM | POA: Diagnosis not present

## 2022-12-11 DIAGNOSIS — M1990 Unspecified osteoarthritis, unspecified site: Secondary | ICD-10-CM | POA: Diagnosis not present

## 2022-12-11 DIAGNOSIS — J342 Deviated nasal septum: Secondary | ICD-10-CM | POA: Diagnosis not present

## 2022-12-11 DIAGNOSIS — J449 Chronic obstructive pulmonary disease, unspecified: Secondary | ICD-10-CM | POA: Diagnosis not present

## 2022-12-12 DIAGNOSIS — Z9181 History of falling: Secondary | ICD-10-CM | POA: Diagnosis not present

## 2022-12-12 DIAGNOSIS — J449 Chronic obstructive pulmonary disease, unspecified: Secondary | ICD-10-CM | POA: Diagnosis not present

## 2022-12-12 DIAGNOSIS — Z7951 Long term (current) use of inhaled steroids: Secondary | ICD-10-CM | POA: Diagnosis not present

## 2022-12-12 DIAGNOSIS — I10 Essential (primary) hypertension: Secondary | ICD-10-CM | POA: Diagnosis not present

## 2022-12-12 DIAGNOSIS — Z87891 Personal history of nicotine dependence: Secondary | ICD-10-CM | POA: Diagnosis not present

## 2022-12-12 DIAGNOSIS — Z9981 Dependence on supplemental oxygen: Secondary | ICD-10-CM | POA: Diagnosis not present

## 2022-12-12 DIAGNOSIS — M1990 Unspecified osteoarthritis, unspecified site: Secondary | ICD-10-CM | POA: Diagnosis not present

## 2022-12-12 DIAGNOSIS — Z85828 Personal history of other malignant neoplasm of skin: Secondary | ICD-10-CM | POA: Diagnosis not present

## 2022-12-12 DIAGNOSIS — I700 Atherosclerosis of aorta: Secondary | ICD-10-CM | POA: Diagnosis not present

## 2022-12-12 DIAGNOSIS — J439 Emphysema, unspecified: Secondary | ICD-10-CM | POA: Diagnosis not present

## 2022-12-12 DIAGNOSIS — J342 Deviated nasal septum: Secondary | ICD-10-CM | POA: Diagnosis not present

## 2022-12-12 DIAGNOSIS — Z48815 Encounter for surgical aftercare following surgery on the digestive system: Secondary | ICD-10-CM | POA: Diagnosis not present

## 2022-12-12 DIAGNOSIS — G4733 Obstructive sleep apnea (adult) (pediatric): Secondary | ICD-10-CM | POA: Diagnosis not present

## 2022-12-16 DIAGNOSIS — I10 Essential (primary) hypertension: Secondary | ICD-10-CM | POA: Diagnosis not present

## 2022-12-16 DIAGNOSIS — J449 Chronic obstructive pulmonary disease, unspecified: Secondary | ICD-10-CM | POA: Diagnosis not present

## 2022-12-16 DIAGNOSIS — G4733 Obstructive sleep apnea (adult) (pediatric): Secondary | ICD-10-CM | POA: Diagnosis not present

## 2022-12-16 DIAGNOSIS — Z85828 Personal history of other malignant neoplasm of skin: Secondary | ICD-10-CM | POA: Diagnosis not present

## 2022-12-16 DIAGNOSIS — M1990 Unspecified osteoarthritis, unspecified site: Secondary | ICD-10-CM | POA: Diagnosis not present

## 2022-12-16 DIAGNOSIS — Z9181 History of falling: Secondary | ICD-10-CM | POA: Diagnosis not present

## 2022-12-16 DIAGNOSIS — Z87891 Personal history of nicotine dependence: Secondary | ICD-10-CM | POA: Diagnosis not present

## 2022-12-16 DIAGNOSIS — Z48815 Encounter for surgical aftercare following surgery on the digestive system: Secondary | ICD-10-CM | POA: Diagnosis not present

## 2022-12-16 DIAGNOSIS — I700 Atherosclerosis of aorta: Secondary | ICD-10-CM | POA: Diagnosis not present

## 2022-12-16 DIAGNOSIS — Z7951 Long term (current) use of inhaled steroids: Secondary | ICD-10-CM | POA: Diagnosis not present

## 2022-12-16 DIAGNOSIS — Z9981 Dependence on supplemental oxygen: Secondary | ICD-10-CM | POA: Diagnosis not present

## 2022-12-16 DIAGNOSIS — J439 Emphysema, unspecified: Secondary | ICD-10-CM | POA: Diagnosis not present

## 2022-12-16 DIAGNOSIS — J342 Deviated nasal septum: Secondary | ICD-10-CM | POA: Diagnosis not present

## 2022-12-17 DIAGNOSIS — Z87891 Personal history of nicotine dependence: Secondary | ICD-10-CM | POA: Diagnosis not present

## 2022-12-17 DIAGNOSIS — J342 Deviated nasal septum: Secondary | ICD-10-CM | POA: Diagnosis not present

## 2022-12-17 DIAGNOSIS — Z9181 History of falling: Secondary | ICD-10-CM | POA: Diagnosis not present

## 2022-12-17 DIAGNOSIS — Z85828 Personal history of other malignant neoplasm of skin: Secondary | ICD-10-CM | POA: Diagnosis not present

## 2022-12-17 DIAGNOSIS — I700 Atherosclerosis of aorta: Secondary | ICD-10-CM | POA: Diagnosis not present

## 2022-12-17 DIAGNOSIS — G4733 Obstructive sleep apnea (adult) (pediatric): Secondary | ICD-10-CM | POA: Diagnosis not present

## 2022-12-17 DIAGNOSIS — J439 Emphysema, unspecified: Secondary | ICD-10-CM | POA: Diagnosis not present

## 2022-12-17 DIAGNOSIS — M1990 Unspecified osteoarthritis, unspecified site: Secondary | ICD-10-CM | POA: Diagnosis not present

## 2022-12-17 DIAGNOSIS — Z7951 Long term (current) use of inhaled steroids: Secondary | ICD-10-CM | POA: Diagnosis not present

## 2022-12-17 DIAGNOSIS — I10 Essential (primary) hypertension: Secondary | ICD-10-CM | POA: Diagnosis not present

## 2022-12-17 DIAGNOSIS — Z48815 Encounter for surgical aftercare following surgery on the digestive system: Secondary | ICD-10-CM | POA: Diagnosis not present

## 2022-12-17 DIAGNOSIS — Z9981 Dependence on supplemental oxygen: Secondary | ICD-10-CM | POA: Diagnosis not present

## 2022-12-17 DIAGNOSIS — J449 Chronic obstructive pulmonary disease, unspecified: Secondary | ICD-10-CM | POA: Diagnosis not present

## 2022-12-18 ENCOUNTER — Ambulatory Visit (INDEPENDENT_AMBULATORY_CARE_PROVIDER_SITE_OTHER): Payer: 59 | Admitting: General Surgery

## 2022-12-18 ENCOUNTER — Encounter: Payer: Self-pay | Admitting: General Surgery

## 2022-12-18 VITALS — BP 134/75 | HR 68 | Temp 98.1°F | Resp 18 | Ht 65.0 in | Wt 130.0 lb

## 2022-12-18 DIAGNOSIS — K4190 Unilateral femoral hernia, without obstruction or gangrene, not specified as recurrent: Secondary | ICD-10-CM

## 2022-12-18 MED ORDER — OXYCODONE HCL 5 MG PO TABS: 5.0000 mg | ORAL_TABLET | ORAL | 0 refills | Status: DC | PRN

## 2022-12-18 NOTE — Patient Instructions (Addendum)
No heavy lifting > 10 lbs, excessive bending, pushing, pulling, or squatting for 6-8 weeks after surgery.   Will see you in a month to check on you.  The pain should continue to improve.

## 2022-12-18 NOTE — Progress Notes (Signed)
[facility]  Doing well and no major complaints. Feels sore and swollen in the lower right abdomen area.   BP 134/75   Pulse 68   Temp 98.1 F (36.7 C) (Oral)   Resp 18   Ht '5\' 5"'$  (1.651 m)   Wt 130 lb (59 kg)   SpO2 90%   BMI 21.63 kg/m  Incisions healing, no erythema or drainage, no signs of hernia recurrence  Patient s/p laparoscopic robotic assisted femoral hernia repair with mesh on the right. He is doing well overall.   No heavy lifting > 10 lbs, excessive bending, pushing, pulling, or squatting for 6-8 weeks after surgery.   Will see you in a month to check on you.  The pain should continue to improve.   Future Appointments  Date Time Provider [place]  12/19/2022 11:30 AM [name], MD [facility]  12/30/2022  3:00 PM Edythe Clarity, [place] None  01/14/2023  2:15 PM [name], MD RS-RS None  02/13/2023  9:00 AM BSFM-NURSE HEALTH ADVISOR [facility] [place], MD [facility] [address] [place], [place] [address] [phone] (office)

## 2022-12-19 ENCOUNTER — Ambulatory Visit (INDEPENDENT_AMBULATORY_CARE_PROVIDER_SITE_OTHER): Payer: 59 | Admitting: Family Medicine

## 2022-12-19 ENCOUNTER — Encounter: Payer: Self-pay | Admitting: Family Medicine

## 2022-12-19 VITALS — BP 126/70 | HR 65 | Temp 98.0°F | Ht 65.0 in | Wt 126.6 lb

## 2022-12-19 DIAGNOSIS — N401 Enlarged prostate with lower urinary tract symptoms: Secondary | ICD-10-CM | POA: Diagnosis not present

## 2022-12-19 DIAGNOSIS — R300 Dysuria: Secondary | ICD-10-CM | POA: Diagnosis not present

## 2022-12-19 LAB — URINALYSIS, ROUTINE W REFLEX MICROSCOPIC
Bilirubin Urine: NEGATIVE
Glucose, UA: NEGATIVE
Hgb urine dipstick: NEGATIVE
Ketones, ur: NEGATIVE
Leukocytes,Ua: NEGATIVE
Nitrite: NEGATIVE
Protein, ur: NEGATIVE
Specific Gravity, Urine: 1.015 (ref 1.001–1.035)
pH: 7 (ref 5.0–8.0)

## 2022-12-19 MED ORDER — FINASTERIDE 5 MG PO TABS: 5.0000 mg | ORAL_TABLET | Freq: Every day | ORAL | 3 refills | Status: AC

## 2022-12-19 MED ORDER — TAMSULOSIN HCL 0.4 MG PO CAPS: 0.8000 mg | ORAL_CAPSULE | Freq: Every day | ORAL | 3 refills | Status: AC

## 2022-12-19 NOTE — Addendum Note (Signed)
Addended by: Curlene Labrum on: 12/19/2022 02:56 PM   Modules accepted: Level of Service

## 2022-12-19 NOTE — Progress Notes (Unsigned)
Care Management & Coordination Services Pharmacy Note  12/19/2022 Name:  ALVESTER EADS MRN:  [id number] DOB:  09-08-1941  Summary: ***  Recommendations/Changes made from today's visit: ***  Follow up plan: ***   Subjective: Michael Evans is an 82 y.o. year old male who is a primary patient of Pickard, Cammie Mcgee, MD.  The care coordination team was consulted for assistance with disease management and care coordination needs.    Engaged with patient by telephone for follow up visit.  Recent office visits:  10/16/2022 OV (PCP) [name], MD; Cipro 500 mg p.o. twice daily.    Recent consult visits:  10/21/2022 OV (Gen Surgery) [name], MD; no medication changes indicated.   Hospital visits:  None since last Adherence Cal   Objective:  Lab Results  Component Value Date   CREATININE 1.11 11/20/2022   BUN 28 (H) 11/20/2022   EGFR 73 10/04/2021   GFRNONAA >60 11/20/2022   GFRAA 95 11/11/2018   NA 140 11/20/2022   K 4.2 11/20/2022   CALCIUM 9.3 11/20/2022   CO2 27 11/20/2022   GLUCOSE 112 (H) 11/20/2022    Lab Results  Component Value Date/Time   HGBA1C 6.5 (H) 05/23/2022 07:03 PM   HGBA1C 6.7 (H) 09/26/2021 05:38 AM    Last diabetic Eye exam: No results found for: "HMDIABEYEEXA"  Last diabetic Foot exam: No results found for: "HMDIABFOOTEX"   No results found for: "CHOL", "HDL", "LDLCALC", "LDLDIRECT", "TRIG", "CHOLHDL"     Latest Ref Rng & Units 06/29/2022    2:04 PM 05/23/2022    1:19 PM 01/27/2022    3:01 PM  Hepatic Function  Total Protein 6.5 - 8.1 g/dL 6.6  7.0  7.3   Albumin 3.5 - 5.0 g/dL 3.7  3.3  4.3   AST 15 - 41 U/L '30  22  27   '$ ALT 0 - 44 U/L '21  24  25   '$ Alk Phosphatase 38 - 126 U/L 48  55  55   Total Bilirubin 0.3 - 1.2 mg/dL 0.7  0.6  0.7   Bilirubin, Direct 0.0 - 0.2 mg/dL  0.1      Lab Results  Component Value Date/Time   TSH 3.226 01/22/2016 12:12 AM       Latest Ref Rng & Units 11/20/2022    4:14 AM 11/14/2022    11:02 AM 06/29/2022    2:04 PM  CBC  WBC 4.0 - 10.5 K/uL 12.0  6.8  8.5   Hemoglobin 13.0 - 17.0 g/dL 12.7  15.3  14.9   Hematocrit 39.0 - 52.0 % 39.0  47.7  44.7   Platelets 150 - 400 K/uL 188  213  208     No results found for: "VD25OH", "VITAMINB12"  Clinical ASCVD: {YES/NO:21197} The ASCVD Risk score (Arnett DK, et al., 2019) failed to calculate for the following reasons:   The 2019 ASCVD risk score is only valid for ages 39 to 43    ***Other: (CHADS2VASc if Afib, MMRC or CAT for COPD, ACT, DEXA)     10/16/2022   11:32 AM 07/28/2022   12:32 PM 02/07/2022    8:57 AM  Depression screen PHQ 2/9  Decreased Interest 0 0 0  Down, Depressed, Hopeless 0 0 0  PHQ - 2 Score 0 0 0     Social History   Tobacco Use  Smoking Status Former   Packs/day: 1.50   Years: 54.00   Total pack years: 81.00   Types:  Cigarettes   Quit date: 12/01/2001   Years since quitting: 21.0  Smokeless Tobacco Never   BP Readings from Last 3 Encounters:  12/19/22 126/70  12/18/22 134/75  12/09/22 126/72   Pulse Readings from Last 3 Encounters:  12/19/22 65  12/18/22 68  12/09/22 61   Wt Readings from Last 3 Encounters:  12/19/22 126 lb 9.6 oz (57.4 kg)  12/18/22 130 lb (59 kg)  12/09/22 126 lb (57.2 kg)   BMI Readings from Last 3 Encounters:  12/19/22 21.07 kg/m  12/18/22 21.63 kg/m  12/09/22 20.97 kg/m    Allergies  Allergen Reactions   Benzyl Alcohol Itching   Amlodipine Swelling    Swelling in feet   Aspirin Other (See Comments)    Upset stomach.   Other Hypertension    Hazelnuts   Penicillins Hives    Has patient had a PCN reaction causing immediate rash, facial/tongue/throat swelling, SOB or lightheadedness with hypotension: Yes Has patient had a PCN reaction causing severe rash involving mucus membranes or skin necrosis: No Has patient had a PCN reaction that required hospitalization No Has patient had a PCN reaction occurring within the last 10 years: No If all of the  above answers are "NO", then may proceed with Cephalosporin use.    Sulfa Antibiotics Hives   Tylenol [Acetaminophen] Other (See Comments)    Pt reports he has had liver issues and was told not to take tylenol     Medications Reviewed Today     Reviewed by Chriss Driver, LPN (Licensed Practical Nurse) on 12/19/22 at 1117  Med List Status: <None>   Medication Order Taking? Sig Documenting Provider Last Dose Status Informant  albuterol (VENTOLIN HFA) 108 (90 Base) MCG/ACT inhaler [id number] Yes INHALE 1-2 PUFFS BY MOUTH EVERY 6 HOURS AS NEEDED FOR WHEEZE OR SHORTNESS OF BREATH  Patient taking differently: Inhale 2 puffs into the lungs every 6 (six) hours as needed for wheezing or shortness of breath.   [name], MD Taking Active Self, Pharmacy Records, Family Member  Ascorbic Acid (VITAMIN C) 1000 MG tablet [id number] Yes Take 1,000 mg by mouth daily. [provider] Taking Active Self, Pharmacy Records, Family Member  Budeson-Glycopyrrol-Formoterol (BREZTRI AEROSPHERE) 160-9-4.8 MCG/ACT AERO [id number] Yes Inhale 2 puffs into the lungs in the morning and at bedtime. [name], MD Taking Active   cholecalciferol (VITAMIN D) 25 MCG (1000 UNIT) tablet [id number] Yes Take 3,000 Units by mouth daily. [provider] Taking Active Self, Pharmacy Records, Family Member  cyanocobalamin 2000 MCG tablet [id number] Yes Take 1,000 mcg by mouth daily. [provider] Taking Active Self, Pharmacy Records, Family Member           Med Note ([place] Jan 22, 2016  9:20 AM)    Multiple Vitamin (MULTIVITAMIN) tablet [id number] Yes Take 1 tablet by mouth daily. Men's health [provider] Taking Active Self, Pharmacy Records, Family Member  Nebulizer [place] [id number] Yes 1 each by Does not apply route daily as needed. PLEASE PROVIDE NEBULIZER KIT TO INCLUDE TUBING/MASK/MOUTHPIECE [name], MD Taking Active Self, Pharmacy Records, Family  Member  Omega-3 Fatty Acids (FISH OIL CONCENTRATE PO) [id number] Yes Take 1,000 mg by mouth daily. [provider] Taking Active Self, Pharmacy Records, Family Member  ondansetron (ZOFRAN-ODT) 4 MG disintegrating tablet [id number] Yes Take 1 tablet (4 mg total) by mouth every 6 (six) hours as needed for nausea. [name], MD Taking Active   oxyCODONE (  ROXICODONE) 5 MG immediate release tablet [id number] Yes Take 1 tablet (5 mg total) by mouth every 4 (four) hours as needed for severe pain or breakthrough pain. [name], MD Taking Active   pyridoxine (B-6) 100 MG tablet [id number] Yes Take 100 mg by mouth daily. [provider] Taking Active Self, Pharmacy Records, Family Member  triamcinolone cream (KENALOG) 0.1 % [id number] Yes Apply 1 Application topically 2 (two) times daily. [name], MD Taking Active             SDOH:  (Social Determinants of Health) assessments and interventions performed: {yes/no:20286} SDOH Interventions    Flowsheet Row Clinical Support from 02/07/2022 in [place] Interventions   Food Insecurity Interventions Intervention Not Indicated  Housing Interventions Intervention Not Indicated  Transportation Interventions Intervention Not Indicated  Financial Strain Interventions Intervention Not Indicated  Physical Activity Interventions Other (Comments)  [Pt states he works in yard and around house.]  Stress Interventions Intervention Not Indicated  Social Connections Interventions Other (Comment)  [Lives with sister. Seens children often per pt.]      SDOH Screenings   Food Insecurity: No Food Insecurity (11/19/2022)  Housing: Low Risk  (11/19/2022)  Transportation Needs: No Transportation Needs (11/19/2022)  Utilities: Not At Risk (11/19/2022)  Alcohol Screen: Low Risk  (02/07/2022)  Depression (PHQ2-9): Low Risk  (10/16/2022)  Financial Resource Strain: Low Risk  (02/07/2022)  Physical  Activity: Insufficiently Active (02/07/2022)  Social Connections: Socially Isolated (02/07/2022)  Stress: No Stress Concern Present (02/07/2022)  Tobacco Use: Medium Risk (12/19/2022)    Medication Assistance: {MEDASSISTANCEINFO:25044}  Medication Access: Within the past 30 days, how often has patient missed a dose of medication? *** Is a pillbox or other method used to improve adherence? {YES/NO:21197} Factors that may affect medication adherence? {CHL DESC; BARRIERS:21522} Are meds synced by current pharmacy? {YES/NO:21197} Are meds delivered by current pharmacy? {YES/NO:21197} Does patient experience delays in picking up medications due to transportation concerns? {YES/NO:21197}  [facility] Services Reviewed: Is patient disadvantaged to use [facility]?: {YES/NO:21197} Current Rx insurance plan: *** Name and location of Current pharmacy:  [id number] [place] [place] [place]Phone: [phone]: [phone] [facility] services reviewed with patient today?: {YES/NO:21197} Patient requests to transfer care to [facility]?: {YES/NO:21197} Reason patient declined to change pharmacies: {US patient preference:27474}  Compliance/Adherence/Medication fill history: Care Gaps: ***  Star-Rating Drugs: ***   Assessment/Plan    Current Barriers:  Unable to maintain control of COPD  Pharmacist Clinical Goal(s):  Patient will achieve improvement in COPD symptoms as evidenced by breathing through collaboration with PharmD and provider.   Interventions: 1:1 collaboration with PSusy Frizzle MD regarding development and update of comprehensive plan of care as evidenced by provider attestation and co-signature Inter-disciplinary care team collaboration (see longitudinal plan of care) Comprehensive medication review performed; medication list updated in electronic medical  record  Hypertension (BP goal <140/90) -Controlled -Current treatment: None -Medications previously tried: hydralazine, losartan, HCTZ  -Current home readings: not checking at home -Current dietary habits: avoids salt, drinks mostly water.  Sister reports that his diet is very good and he does not eat anything with oils, etc. -Current exercise habits: walking around the house, to mailbox and back (about 400 yards) -Denies hypotensive/hypertensive symptoms -Educated on BP goals and benefits of medications for prevention of heart attack, stroke and kidney damage; Daily salt intake goal < 2300 mg; Exercise  goal of 150 minutes per week; Importance of home blood pressure monitoring; -Counseled to monitor BP at home a few times per week, document, and provide log at future appointments -Recommended to continue current medication  COPD (Goal: control symptoms and prevent exacerbations) -Controlled -Current treatment  Breztri 160-9-4.8 mcg Appropriate, Effective, Safe, Accessible Albuterol HFA 66mg Appropriate, Effective, Safe, Accessible -Medications previously tried: Duoneb, combivent, Symbicort, Spiriva  -Gold Grade: Gold 2 (FEV1 50-79%) -Current COPD Classification:  A (low sx, <2 exacerbations/yr) -MMRC/CAT score:     11/29/2021    9:24 AM  CAT Score  Total CAT Score 25  -Pulmonary function testing: Pulmonary Functions Testing Results:  No results found for: FEV1, FVC, FEV1FVC, TLC, DLCO  -Exacerbations requiring treatment in last 6 months: One - admitted -Patient reports consistent use of maintenance inhaler -Frequency of rescue inhaler use: twice daily -Counseled on Proper inhaler technique; Benefits of consistent maintenance inhaler use When to use rescue inhaler Differences between maintenance and rescue inhalers -Recommended to continue current medication [facility].  Reports his breathing has gotten much better since this.   He is currently on samples.  Provided my contact number for them to call in case when he goes to pick up rx the copay is a burden.  Stressed importance of adherence moving forward.  Hyperglycemia (Goal: Control A1c) -Controlled -Current treatment  None -Medications previously tried: none -Steroids due to COPD exacerbations playing role in elevated sugars. Control breathing will hopefully prevent spikes in sugar with steroids.  -Recommended continue current management, no changes at thist ime  Patient Goals/Self-Care Activities Patient will:  - focus on medication adherence by using inhalers twice daily as directed  Follow Up Plan: The care management team will reach out to the patient again over the next 180 days.        CBeverly Milch PharmD, CPP Clinical Pharmacist Practitioner [phone]

## 2022-12-19 NOTE — Progress Notes (Signed)
Subjective:    Patient ID: Michael Evans, male    DOB: [birth year], 82 y.o.   MRN: [id number] 12/09/22 Patient is an 82 year old Caucasian gentleman with a history of COPD.  He is supposed to be on Breztri, 2 puffs twice daily.  However he states that he has not been taking his medication.  He states that his breathing has been gradually getting worse for quite some time.  He is unable to quantify exactly how long or when it started getting worse.  Today he has rhonchorous breath sounds throughout his lungs and diminished breath sounds throughout.  He reports sputum production but he denies any blood or purulent sputum.  He denies any fevers or chills or chest pain or pleurisy or hemoptysis.  At that time, my plan was:  I believe the patient is having a mild COPD exacerbation brought on by stopping his medication.  Resume Breztri 2 inhalations twice daily.  I gave him samples of this today.  Also begin a prednisone taper pack due to diminished breath sounds expiratory wheezing and increased sputum production along with a Z-Pak.  Reassess in 1 week if no better or sooner if worse.  12/09/22 Appointment was scheduled for cough, the patient states that he is having burning when he urinates when I asked his concern today.  Patient has some mild confusion due to age.  Therefore I asked him several times if he was having trouble breathing and he denies trouble breathing today.  He does have a cough with mucus but this is his baseline with his COPD.  He insist that the reason he came in today was due to dysuria and trouble going to the restroom.  He reports a weak stream and trouble emptying his bladder.  He was seen in the emergency room in July and was diagnosed with an enlarged prostate.  He was referred to a urologist.  He states that the prostate medication is not working.  However there is no medication listed on his medical record for BPH.  He was given Cipro in July at the emergency room.  He is unable to  recall the name of the "prostate medicine" that he is taking.  Today on exam his prostate is mildly enlarged but it is nontender.  There is no nodularity. Past Medical History:  Diagnosis Date   Arthritis    Chronic rhinitis    Complication of anesthesia    patient usually hs to be intubated with anesthesia   COPD (chronic obstructive pulmonary disease) ([place])    PFT 06-26-09 FEV1  1.75( 71%), FVC 3.65( 101%), FEV1% 48, TLC 5.53(104%), DLCO 55%, no BD   Dyspnea    Hypertension    Nasal septal deviation    Nocturia    On home O2    2L N/C   OSA (obstructive sleep apnea) 04/21/2011   Skin cancer    Past Surgical History:  Procedure Laterality Date   APPENDECTOMY     BACK SURGERY     CATARACT EXTRACTION W/PHACO  01/06/2013   Procedure: CATARACT EXTRACTION PHACO AND INTRAOCULAR LENS PLACEMENT ([place]);  Surgeon: Tonny Branch, MD;  Location: [facility];  Service: Ophthalmology;  Laterality: Right;  CDE: 22.17   CATARACT EXTRACTION W/PHACO Left 06/20/2022   Procedure: CATARACT EXTRACTION PHACO AND INTRAOCULAR LENS PLACEMENT ([facility]);  Surgeon: Baruch Goldmann, MD;  Location: [facility];  Service: Ophthalmology;  Laterality: Left;  CDE 20.85   FEMORAL HERNIA REPAIR Right 11/19/2022   Procedure: HERNIA REPAIR FEMORAL WITH  MESH;  Surgeon: [name], MD;  Location: [facility];  Service: General;  Laterality: Right;   INGUINAL HERNIA REPAIR Left 12/09/2021   Procedure: HERNIA REPAIR INGUINAL ADULT WITH MESH;  Surgeon: [name], MD;  Location: [facility];  Service: General;  Laterality: Left;   INGUINAL HERNIA REPAIR Right 03/19/2022   Procedure: HERNIA REPAIR INGUINAL ADULT WITH MESH;  Surgeon: [name], MD;  Location: [facility];  Service: General;  Laterality: Right;   Current Outpatient Medications on File Prior to Visit  Medication Sig Dispense Refill   albuterol (VENTOLIN HFA) 108 (90 Base) MCG/ACT inhaler INHALE 1-2 PUFFS BY MOUTH EVERY 6 HOURS AS NEEDED FOR WHEEZE OR SHORTNESS OF BREATH  (Patient taking differently: Inhale 2 puffs into the lungs every 6 (six) hours as needed for wheezing or shortness of breath.) 6.7 each 3   Ascorbic Acid (VITAMIN C) 1000 MG tablet Take 1,000 mg by mouth daily.     Budeson-Glycopyrrol-Formoterol (BREZTRI AEROSPHERE) 160-9-4.8 MCG/ACT AERO Inhale 2 puffs into the lungs in the morning and at bedtime. 1 each 11   cholecalciferol (VITAMIN D) 25 MCG (1000 UNIT) tablet Take 3,000 Units by mouth daily.     cyanocobalamin 2000 MCG tablet Take 1,000 mcg by mouth daily.     Multiple Vitamin (MULTIVITAMIN) tablet Take 1 tablet by mouth daily. Men's health     Nebulizer MISC 1 each by Does not apply route daily as needed. PLEASE PROVIDE NEBULIZER KIT TO INCLUDE TUBING/MASK/MOUTHPIECE 1 each 3   Omega-3 Fatty Acids (FISH OIL CONCENTRATE PO) Take 1,000 mg by mouth daily.     ondansetron (ZOFRAN-ODT) 4 MG disintegrating tablet Take 1 tablet (4 mg total) by mouth every 6 (six) hours as needed for nausea. 20 tablet 0   oxyCODONE (ROXICODONE) 5 MG immediate release tablet Take 1 tablet (5 mg total) by mouth every 4 (four) hours as needed for severe pain or breakthrough pain. 10 tablet 0   pyridoxine (B-6) 100 MG tablet Take 100 mg by mouth daily.     triamcinolone cream (KENALOG) 0.1 % Apply 1 Application topically 2 (two) times daily. 30 g 0   No current facility-administered medications on file prior to visit.   Allergies  Allergen Reactions   Benzyl Alcohol Itching   Amlodipine Swelling    Swelling in feet   Aspirin Other (See Comments)    Upset stomach.   Other Hypertension    Hazelnuts   Penicillins Hives    Has patient had a PCN reaction causing immediate rash, facial/tongue/throat swelling, SOB or lightheadedness with hypotension: Yes Has patient had a PCN reaction causing severe rash involving mucus membranes or skin necrosis: No Has patient had a PCN reaction that required hospitalization No Has patient had a PCN reaction occurring within the last  10 years: No If all of the above answers are "NO", then may proceed with Cephalosporin use.    Sulfa Antibiotics Hives   Tylenol [Acetaminophen] Other (See Comments)    Pt reports he has had liver issues and was told not to take tylenol    Social History   Socioeconomic History   Marital status: Divorced    Spouse name: Not on file   Number of children: 3   Years of education: Not on file   Highest education level: Not on file  Occupational History   Occupation: Neurosurgeon: RETIRED  Tobacco Use   Smoking status: Former    Packs/day: 1.50  Years: 54.00    Total pack years: 81.00    Types: Cigarettes    Quit date: 12/01/2001    Years since quitting: 21.0   Smokeless tobacco: Never  Vaping Use   Vaping Use: Never used  Substance and Sexual Activity   Alcohol use: No   Drug use: No   Sexual activity: Not on file  Other Topics Concern   Not on file  Social History Narrative   Lives with sister.    Social Determinants of Health   Financial Resource Strain: Low Risk  (02/07/2022)   Overall Financial Resource Strain (CARDIA)    Difficulty of Paying Living Expenses: Not hard at all  Food Insecurity: No Food Insecurity (11/19/2022)   Hunger Vital Sign    Worried About Running Out of Food in the Last Year: Never true    Ran Out of Food in the Last Year: Never true  Transportation Needs: No Transportation Needs (11/19/2022)   PRAPARE - Hydrologist (Medical): No    Lack of Transportation (Non-Medical): No  Physical Activity: Insufficiently Active (02/07/2022)   Exercise Vital Sign    Days of Exercise per Week: 3 days    Minutes of Exercise per Session: 20 min  Stress: No Stress Concern Present (02/07/2022)   [place]    Feeling of Stress : Not at all  Social Connections: Socially Isolated (02/07/2022)   Social Connection and Isolation Panel [NHANES]    Frequency of  Communication with Friends and Family: More than three times a week    Frequency of Social Gatherings with Friends and Family: More than three times a week    Attends Religious Services: Never    Marine scientist or Organizations: No    Attends Archivist Meetings: Never    Marital Status: Divorced  Human resources officer Violence: Not At Risk (11/19/2022)   Humiliation, Afraid, Rape, and Kick questionnaire    Fear of Current or Ex-Partner: No    Emotionally Abused: No    Physically Abused: No    Sexually Abused: No     Review of Systems  All other systems reviewed and are negative.      Objective:   Physical Exam Constitutional:      General: He is not in acute distress.    Appearance: Normal appearance. He is not ill-appearing or toxic-appearing.  Cardiovascular:     Rate and Rhythm: Normal rate and regular rhythm.     Heart sounds: Normal heart sounds. No murmur heard.    No friction rub. No gallop.  Pulmonary:     Effort: Pulmonary effort is normal. No tachypnea.     Breath sounds: Decreased air movement present. Decreased breath sounds present. No wheezing, rhonchi or rales.  Genitourinary:    Penis: Normal.      Prostate: Enlarged.     Rectum: Normal.  Neurological:     Mental Status: He is alert.           Assessment & Plan:  Dysuria - Plan: Urinalysis, Routine w reflex microscopic  Benign localized prostatic hyperplasia with lower urinary tract symptoms (LUTS)  Patient is unable to give a urine sample today.  However I believe that he is dealing with BPH.  I am uncertain of the medication that he is taking from his urologist.  Without knowing his medication, I will try the patient on Flomax 0.4 mg and try to obtain a urinalysis  to see if there is any evidence of urinary tract infection.  I was able to find a note from urology in August mentioning Flomax however I am uncertain if the patient is taking this.  I will increase his Flomax to 0.8 mg  nightly and also add finasteride 5 mg daily to try to address BPH with lower urinary tract symptoms.  Await urinalysis to see if there is a urinary tract infection.  Patient is coughing throughout the encounter today however his lungs sound clear today than they did a week ago and he insist that he is not having any shortness of breath beyond his baseline despite me asking him several times.

## 2022-12-26 DIAGNOSIS — I700 Atherosclerosis of aorta: Secondary | ICD-10-CM | POA: Diagnosis not present

## 2022-12-26 DIAGNOSIS — J439 Emphysema, unspecified: Secondary | ICD-10-CM | POA: Diagnosis not present

## 2022-12-26 DIAGNOSIS — Z87891 Personal history of nicotine dependence: Secondary | ICD-10-CM | POA: Diagnosis not present

## 2022-12-26 DIAGNOSIS — M1990 Unspecified osteoarthritis, unspecified site: Secondary | ICD-10-CM | POA: Diagnosis not present

## 2022-12-26 DIAGNOSIS — Z85828 Personal history of other malignant neoplasm of skin: Secondary | ICD-10-CM | POA: Diagnosis not present

## 2022-12-26 DIAGNOSIS — I10 Essential (primary) hypertension: Secondary | ICD-10-CM | POA: Diagnosis not present

## 2022-12-26 DIAGNOSIS — J342 Deviated nasal septum: Secondary | ICD-10-CM | POA: Diagnosis not present

## 2022-12-26 DIAGNOSIS — Z9181 History of falling: Secondary | ICD-10-CM | POA: Diagnosis not present

## 2022-12-26 DIAGNOSIS — Z48815 Encounter for surgical aftercare following surgery on the digestive system: Secondary | ICD-10-CM | POA: Diagnosis not present

## 2022-12-26 DIAGNOSIS — G4733 Obstructive sleep apnea (adult) (pediatric): Secondary | ICD-10-CM | POA: Diagnosis not present

## 2022-12-26 DIAGNOSIS — Z7951 Long term (current) use of inhaled steroids: Secondary | ICD-10-CM | POA: Diagnosis not present

## 2022-12-26 DIAGNOSIS — J449 Chronic obstructive pulmonary disease, unspecified: Secondary | ICD-10-CM | POA: Diagnosis not present

## 2022-12-26 DIAGNOSIS — Z9981 Dependence on supplemental oxygen: Secondary | ICD-10-CM | POA: Diagnosis not present

## 2022-12-30 ENCOUNTER — Encounter: Payer: Medicare Other | Admitting: Pharmacist

## 2022-12-30 DIAGNOSIS — Z85828 Personal history of other malignant neoplasm of skin: Secondary | ICD-10-CM | POA: Diagnosis not present

## 2022-12-30 DIAGNOSIS — I700 Atherosclerosis of aorta: Secondary | ICD-10-CM | POA: Diagnosis not present

## 2022-12-30 DIAGNOSIS — Z9981 Dependence on supplemental oxygen: Secondary | ICD-10-CM | POA: Diagnosis not present

## 2022-12-30 DIAGNOSIS — M1990 Unspecified osteoarthritis, unspecified site: Secondary | ICD-10-CM | POA: Diagnosis not present

## 2022-12-30 DIAGNOSIS — Z48815 Encounter for surgical aftercare following surgery on the digestive system: Secondary | ICD-10-CM | POA: Diagnosis not present

## 2022-12-30 DIAGNOSIS — Z7951 Long term (current) use of inhaled steroids: Secondary | ICD-10-CM | POA: Diagnosis not present

## 2022-12-30 DIAGNOSIS — I10 Essential (primary) hypertension: Secondary | ICD-10-CM | POA: Diagnosis not present

## 2022-12-30 DIAGNOSIS — G4733 Obstructive sleep apnea (adult) (pediatric): Secondary | ICD-10-CM | POA: Diagnosis not present

## 2022-12-30 DIAGNOSIS — J439 Emphysema, unspecified: Secondary | ICD-10-CM | POA: Diagnosis not present

## 2022-12-30 DIAGNOSIS — J342 Deviated nasal septum: Secondary | ICD-10-CM | POA: Diagnosis not present

## 2022-12-30 DIAGNOSIS — Z9181 History of falling: Secondary | ICD-10-CM | POA: Diagnosis not present

## 2022-12-30 DIAGNOSIS — Z87891 Personal history of nicotine dependence: Secondary | ICD-10-CM | POA: Diagnosis not present

## 2022-12-30 DIAGNOSIS — J449 Chronic obstructive pulmonary disease, unspecified: Secondary | ICD-10-CM | POA: Diagnosis not present

## 2022-12-31 ENCOUNTER — Encounter: Payer: Medicare Other | Admitting: Pharmacist

## 2022-12-31 NOTE — Progress Notes (Incomplete)
Care Management & Coordination Services Pharmacy Note  12/31/2022 Name:  OMAR GAYDEN MRN:  [id number] DOB:  08-31-1941  Summary: ***  Recommendations/Changes made from today's visit: ***  Follow up plan: ***   Subjective: Michael Evans is an 82 y.o. year old male who is a primary patient of Pickard, Cammie Mcgee, MD.  The care coordination team was consulted for assistance with disease management and care coordination needs.    Engaged with patient by telephone for follow up visit.  Recent office visits:  10/16/2022 OV (PCP) [name], MD; Cipro 500 mg p.o. twice daily.    Recent consult visits:  10/21/2022 OV (Gen Surgery) [name], MD; no medication changes indicated.   Hospital visits:  None since last Adherence Cal   Objective:  Lab Results  Component Value Date   CREATININE 1.11 11/20/2022   BUN 28 (H) 11/20/2022   EGFR 73 10/04/2021   GFRNONAA >60 11/20/2022   GFRAA 95 11/11/2018   NA 140 11/20/2022   K 4.2 11/20/2022   CALCIUM 9.3 11/20/2022   CO2 27 11/20/2022   GLUCOSE 112 (H) 11/20/2022    Lab Results  Component Value Date/Time   HGBA1C 6.5 (H) 05/23/2022 07:03 PM   HGBA1C 6.7 (H) 09/26/2021 05:38 AM    Last diabetic Eye exam: No results found for: "HMDIABEYEEXA"  Last diabetic Foot exam: No results found for: "HMDIABFOOTEX"   No results found for: "CHOL", "HDL", "LDLCALC", "LDLDIRECT", "TRIG", "CHOLHDL"     Latest Ref Rng & Units 06/29/2022    2:04 PM 05/23/2022    1:19 PM 01/27/2022    3:01 PM  Hepatic Function  Total Protein 6.5 - 8.1 g/dL 6.6  7.0  7.3   Albumin 3.5 - 5.0 g/dL 3.7  3.3  4.3   AST 15 - 41 U/L '30  22  27   '$ ALT 0 - 44 U/L '21  24  25   '$ Alk Phosphatase 38 - 126 U/L 48  55  55   Total Bilirubin 0.3 - 1.2 mg/dL 0.7  0.6  0.7   Bilirubin, Direct 0.0 - 0.2 mg/dL  0.1      Lab Results  Component Value Date/Time   TSH 3.226 01/22/2016 12:12 AM       Latest Ref Rng & Units 11/20/2022    4:14 AM 11/14/2022    11:02 AM 06/29/2022    2:04 PM  CBC  WBC 4.0 - 10.5 K/uL 12.0  6.8  8.5   Hemoglobin 13.0 - 17.0 g/dL 12.7  15.3  14.9   Hematocrit 39.0 - 52.0 % 39.0  47.7  44.7   Platelets 150 - 400 K/uL 188  213  208     No results found for: "VD25OH", "VITAMINB12"  Clinical ASCVD: No  The ASCVD Risk score (Arnett DK, et al., 2019) failed to calculate for the following reasons:   The 2019 ASCVD risk score is only valid for ages 69 to 57    ***Other: (CHADS2VASc if Afib, MMRC or CAT for COPD, ACT, DEXA)     10/16/2022   11:32 AM 07/28/2022   12:32 PM 02/07/2022    8:57 AM  Depression screen PHQ 2/9  Decreased Interest 0 0 0  Down, Depressed, Hopeless 0 0 0  PHQ - 2 Score 0 0 0     Social History   Tobacco Use  Smoking Status Former   Packs/day: 1.50   Years: 54.00   Total pack years: 81.00  Types: Cigarettes   Quit date: 12/01/2001   Years since quitting: 21.0  Smokeless Tobacco Never   BP Readings from Last 3 Encounters:  12/19/22 126/70  12/18/22 134/75  12/09/22 126/72   Pulse Readings from Last 3 Encounters:  12/19/22 65  12/18/22 68  12/09/22 61   Wt Readings from Last 3 Encounters:  12/19/22 126 lb 9.6 oz (57.4 kg)  12/18/22 130 lb (59 kg)  12/09/22 126 lb (57.2 kg)   BMI Readings from Last 3 Encounters:  12/19/22 21.07 kg/m  12/18/22 21.63 kg/m  12/09/22 20.97 kg/m    Allergies  Allergen Reactions   Benzyl Alcohol Itching   Amlodipine Swelling    Swelling in feet   Aspirin Other (See Comments)    Upset stomach.   Other Hypertension    Hazelnuts   Penicillins Hives    Has patient had a PCN reaction causing immediate rash, facial/tongue/throat swelling, SOB or lightheadedness with hypotension: Yes Has patient had a PCN reaction causing severe rash involving mucus membranes or skin necrosis: No Has patient had a PCN reaction that required hospitalization No Has patient had a PCN reaction occurring within the last 10 years: No If all of the above answers  are "NO", then may proceed with Cephalosporin use.    Sulfa Antibiotics Hives   Tylenol [Acetaminophen] Other (See Comments)    Pt reports he has had liver issues and was told not to take tylenol     Medications Reviewed Today     Reviewed by Chriss Driver, LPN (Licensed Practical Nurse) on 12/19/22 at 1117  Med List Status: <None>   Medication Order Taking? Sig Documenting Provider Last Dose Status Informant  albuterol (VENTOLIN HFA) 108 (90 Base) MCG/ACT inhaler [id number] Yes INHALE 1-2 PUFFS BY MOUTH EVERY 6 HOURS AS NEEDED FOR WHEEZE OR SHORTNESS OF BREATH  Patient taking differently: Inhale 2 puffs into the lungs every 6 (six) hours as needed for wheezing or shortness of breath.   [name], MD Taking Active Self, Pharmacy Records, Family Member  Ascorbic Acid (VITAMIN C) 1000 MG tablet [id number] Yes Take 1,000 mg by mouth daily. [provider] Taking Active Self, Pharmacy Records, Family Member  Budeson-Glycopyrrol-Formoterol (BREZTRI AEROSPHERE) 160-9-4.8 MCG/ACT AERO [id number] Yes Inhale 2 puffs into the lungs in the morning and at bedtime. [name], MD Taking Active   cholecalciferol (VITAMIN D) 25 MCG (1000 UNIT) tablet [id number] Yes Take 3,000 Units by mouth daily. [provider] Taking Active Self, Pharmacy Records, Family Member  cyanocobalamin 2000 MCG tablet [id number] Yes Take 1,000 mcg by mouth daily. [provider] Taking Active Self, Pharmacy Records, Family Member           Med Note ([place] Jan 22, 2016  9:20 AM)    Multiple Vitamin (MULTIVITAMIN) tablet [id number] Yes Take 1 tablet by mouth daily. Men's health [provider] Taking Active Self, Pharmacy Records, Family Member  Nebulizer [place] [id number] Yes 1 each by Does not apply route daily as needed. PLEASE PROVIDE NEBULIZER KIT TO INCLUDE TUBING/MASK/MOUTHPIECE [name], MD Taking Active Self, Pharmacy Records, Family Member  Omega-3  Fatty Acids (FISH OIL CONCENTRATE PO) [id number] Yes Take 1,000 mg by mouth daily. [provider] Taking Active Self, Pharmacy Records, Family Member  ondansetron (ZOFRAN-ODT) 4 MG disintegrating tablet [id number] Yes Take 1 tablet (4 mg total) by mouth every 6 (six) hours as needed for nausea. [name], MD Taking Active  oxyCODONE (ROXICODONE) 5 MG immediate release tablet [id number] Yes Take 1 tablet (5 mg total) by mouth every 4 (four) hours as needed for severe pain or breakthrough pain. [name], MD Taking Active   pyridoxine (B-6) 100 MG tablet [id number] Yes Take 100 mg by mouth daily. [provider] Taking Active Self, Pharmacy Records, Family Member  triamcinolone cream (KENALOG) 0.1 % [id number] Yes Apply 1 Application topically 2 (two) times daily. [name], MD Taking Active             SDOH:  (Social Determinants of Health) assessments and interventions performed: {yes/no:20286} SDOH Interventions    Flowsheet Row Clinical Support from 02/07/2022 in [place] Interventions   Food Insecurity Interventions Intervention Not Indicated  Housing Interventions Intervention Not Indicated  Transportation Interventions Intervention Not Indicated  Financial Strain Interventions Intervention Not Indicated  Physical Activity Interventions Other (Comments)  [Pt states he works in yard and around house.]  Stress Interventions Intervention Not Indicated  Social Connections Interventions Other (Comment)  [Lives with sister. Seens children often per pt.]       Medication Assistance: {MEDASSISTANCEINFO:25044}  Medication Access: Within the past 30 days, how often has patient missed a dose of medication? *** Is a pillbox or other method used to improve adherence? {YES/NO:21197} Factors that may affect medication adherence? {CHL DESC; BARRIERS:21522} Are meds synced by current pharmacy? {YES/NO:21197} Are meds delivered by  current pharmacy? {YES/NO:21197} Does patient experience delays in picking up medications due to transportation concerns? {YES/NO:21197}  [facility] Reviewed: Is patient disadvantaged to use [facility]?: {YES/NO:21197} Current Rx insurance plan: *** Name and location of Current pharmacy:  [id number] [place]Phone: [phone]: [phone] [facility] services reviewed with patient today?: {YES/NO:21197} Patient requests to transfer care to [facility]?: {YES/NO:21197} Reason patient declined to change pharmacies: {US patient preference:27474}  Compliance/Adherence/Medication fill history: Care Gaps: ***  Star-Rating Drugs: ***   Assessment/Plan   Patient Care Plan: General Pharmacy (Adult)       Hypertension (BP goal <140/90) -Controlled -Current treatment: None -Medications previously tried: hydralazine, losartan, HCTZ  -Current home readings: not checking at home -Current dietary habits: avoids salt, drinks mostly water.  Sister reports that his diet is very good and he does not eat anything with oils, etc. -Current exercise habits: walking around the house, to mailbox and back (about 400 yards) -Denies hypotensive/hypertensive symptoms -Educated on BP goals and benefits of medications for prevention of heart attack, stroke and kidney damage; Daily salt intake goal < 2300 mg; Exercise goal of 150 minutes per week; Importance of home blood pressure monitoring; -Counseled to monitor BP at home a few times per week, document, and provide log at future appointments -Recommended to continue current medication  COPD (Goal: control symptoms and prevent exacerbations) -Controlled -Current treatment  Breztri 160-9-4.8 mcg Appropriate, Effective, Safe, Accessible Albuterol HFA 962m Appropriate, Effective, Safe, Accessible -Medications previously  tried: Duoneb, combivent, Symbicort, Spiriva  -Gold Grade: Gold 2 (FEV1 50-79%) -Current COPD Classification:  A (low sx, <2 exacerbations/yr) -MMRC/CAT score:     11/29/2021    9:24 AM  CAT Score  Total CAT Score 25  -Pulmonary function testing: Pulmonary Functions Testing Results:  No results found for: FEV1, FVC, FEV1FVC, TLC, DLCO  -Exacerbations requiring treatment in last 6 months: One - admitted -Patient reports consistent use of maintenance inhaler -Frequency of rescue inhaler  use: twice daily -Counseled on Proper inhaler technique; Benefits of consistent maintenance inhaler use When to use rescue inhaler Differences between maintenance and rescue inhalers -Recommended to continue current medication Recently started [facility] after most recent hospital stay.  Reports his breathing has gotten much better since this.  He is currently on samples.  Provided my contact number for them to call in case when he goes to pick up rx the copay is a burden.  Stressed importance of adherence moving forward.  Hyperglycemia (Goal: Control A1c) -Controlled -Current treatment  None -Medications previously tried: none -Steroids due to COPD exacerbations playing role in elevated sugars. Control breathing will hopefully prevent spikes in sugar with steroids.  -Recommended continue current management, no changes at thist ime  Patient Goals/Self-Care Activities Patient will:  - focus on medication adherence by using inhalers twice daily as directed  Follow Up Plan: The care management team will reach out to the patient again over the next 180 days.        Beverly Milch, PharmD, CPP Clinical Pharmacist Practitioner [facility] [phone]

## 2023-01-01 DIAGNOSIS — J449 Chronic obstructive pulmonary disease, unspecified: Secondary | ICD-10-CM | POA: Diagnosis not present

## 2023-01-01 DIAGNOSIS — J441 Chronic obstructive pulmonary disease with (acute) exacerbation: Secondary | ICD-10-CM | POA: Diagnosis not present

## 2023-01-06 DIAGNOSIS — I700 Atherosclerosis of aorta: Secondary | ICD-10-CM | POA: Diagnosis not present

## 2023-01-06 DIAGNOSIS — Z9181 History of falling: Secondary | ICD-10-CM | POA: Diagnosis not present

## 2023-01-06 DIAGNOSIS — J449 Chronic obstructive pulmonary disease, unspecified: Secondary | ICD-10-CM | POA: Diagnosis not present

## 2023-01-06 DIAGNOSIS — Z87891 Personal history of nicotine dependence: Secondary | ICD-10-CM | POA: Diagnosis not present

## 2023-01-06 DIAGNOSIS — G4733 Obstructive sleep apnea (adult) (pediatric): Secondary | ICD-10-CM | POA: Diagnosis not present

## 2023-01-06 DIAGNOSIS — Z9981 Dependence on supplemental oxygen: Secondary | ICD-10-CM | POA: Diagnosis not present

## 2023-01-06 DIAGNOSIS — I10 Essential (primary) hypertension: Secondary | ICD-10-CM | POA: Diagnosis not present

## 2023-01-06 DIAGNOSIS — Z48815 Encounter for surgical aftercare following surgery on the digestive system: Secondary | ICD-10-CM | POA: Diagnosis not present

## 2023-01-06 DIAGNOSIS — J439 Emphysema, unspecified: Secondary | ICD-10-CM | POA: Diagnosis not present

## 2023-01-06 DIAGNOSIS — M1990 Unspecified osteoarthritis, unspecified site: Secondary | ICD-10-CM | POA: Diagnosis not present

## 2023-01-06 DIAGNOSIS — J342 Deviated nasal septum: Secondary | ICD-10-CM | POA: Diagnosis not present

## 2023-01-06 DIAGNOSIS — Z7951 Long term (current) use of inhaled steroids: Secondary | ICD-10-CM | POA: Diagnosis not present

## 2023-01-06 DIAGNOSIS — Z85828 Personal history of other malignant neoplasm of skin: Secondary | ICD-10-CM | POA: Diagnosis not present

## 2023-01-07 IMAGING — CT CT ABD-PELV W/ CM
2 of 5 series · 16 of 46 positions shown, 18 images · IV contrast (Omnipaque or Isovue)
Comparison: None

CLINICAL DATA: Acute abdominal pain, groin pain

EXAM:
CT ABDOMEN AND PELVIS WITH CONTRAST
TECHNIQUE: Multidetector CT imaging of the abdomen and pelvis was performed
using the standard protocol following bolus administration of
intravenous contrast.
CONTRAST:  100mL OMNIPAQUE IOHEXOL 300 MG/ML  SOLN

[Series 2: axial st · axial · 0.69mm/px · z∈[-223,+167]mm · 13 of 90 slices shown, 15 images]
[im 6/90  soft-tissue]
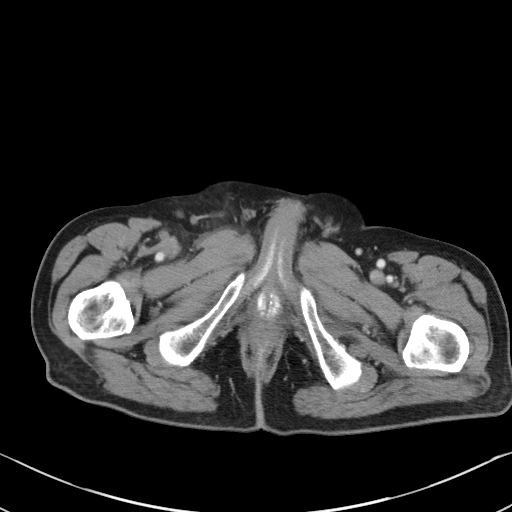
[im 6/90  bone]
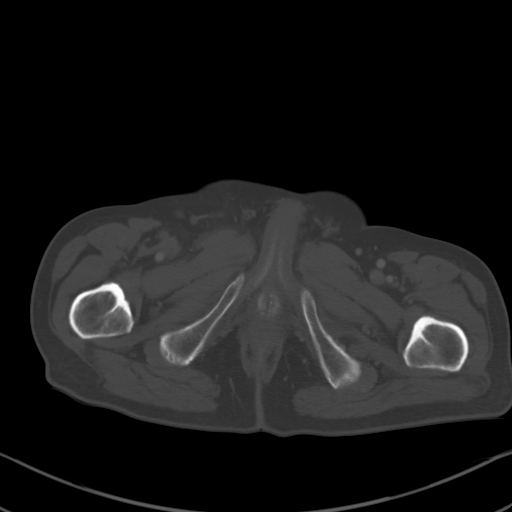
[im 11/90  soft-tissue]
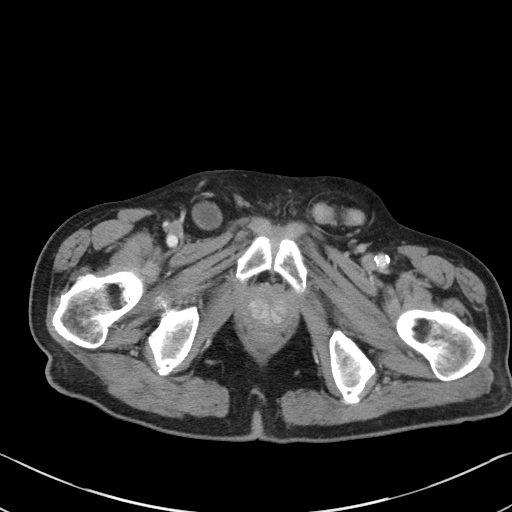
[im 21/90  soft-tissue]
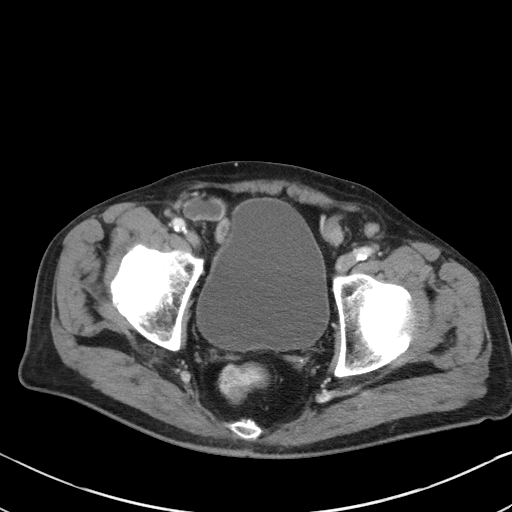
[im 27/90  soft-tissue]
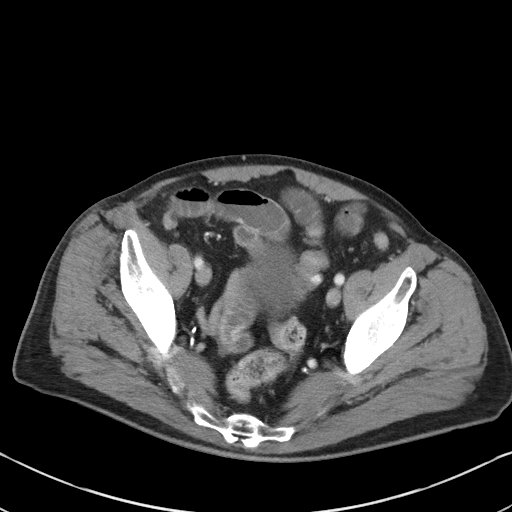
[im 32/90  soft-tissue]
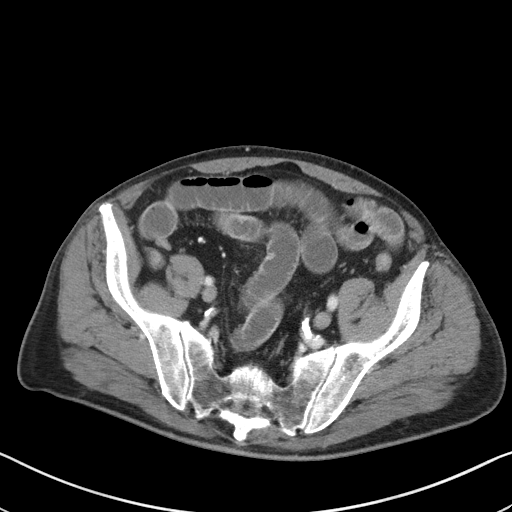
[im 37/90  soft-tissue]
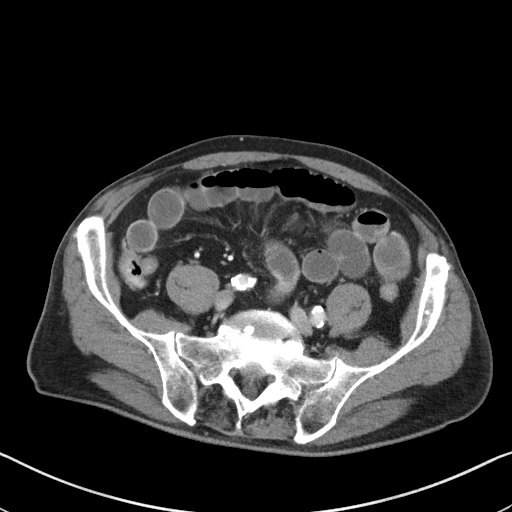
[im 48/90  soft-tissue]
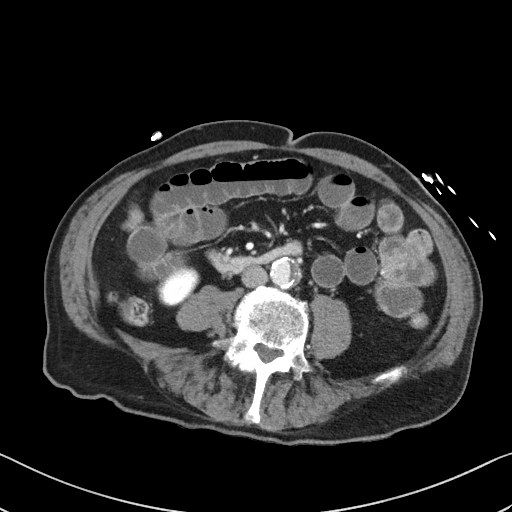
[im 53/90  soft-tissue]
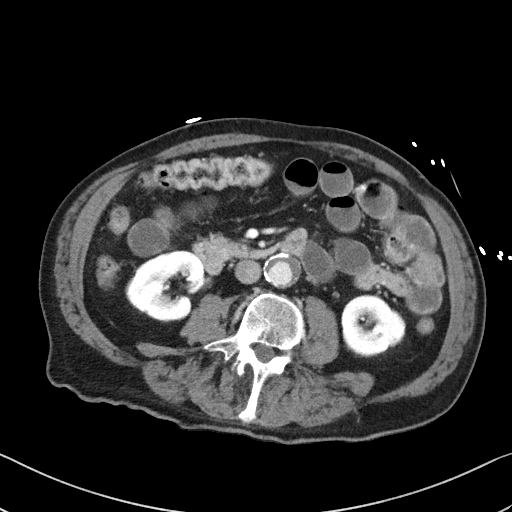
[im 58/90  soft-tissue]
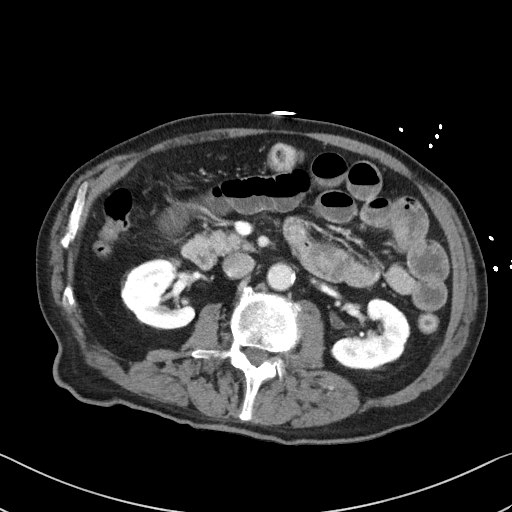
[im 58/90  bone]
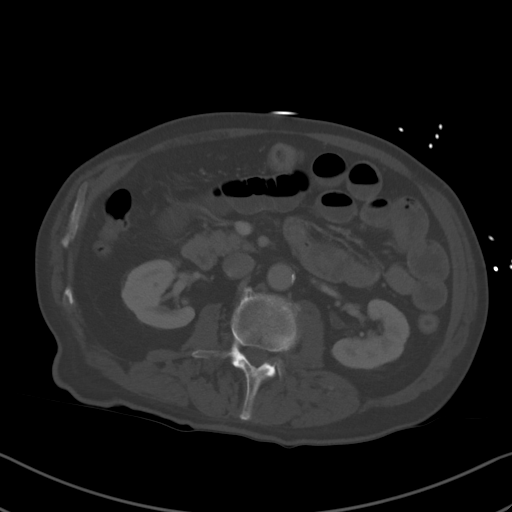
[im 63/90  soft-tissue]
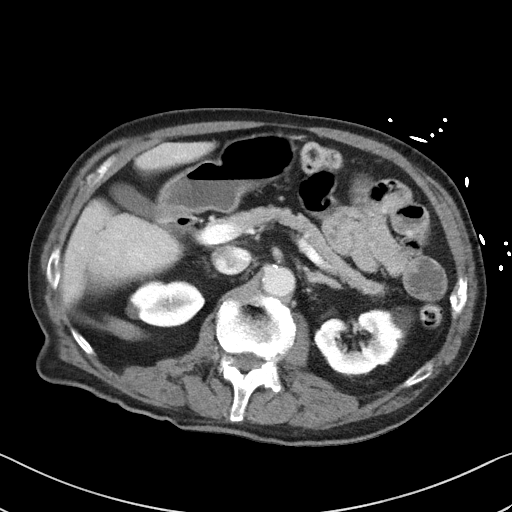
[im 69/90  soft-tissue]
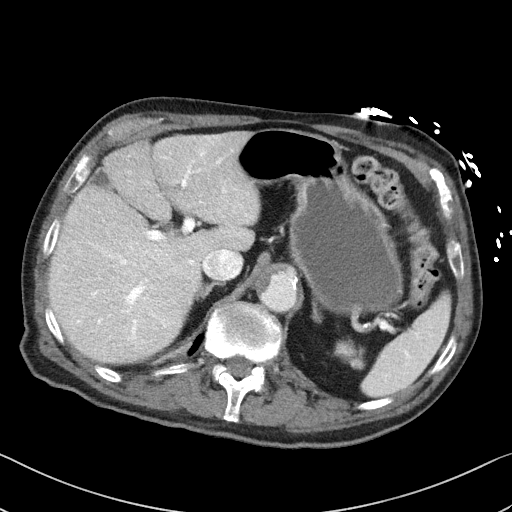
[im 79/90  soft-tissue]
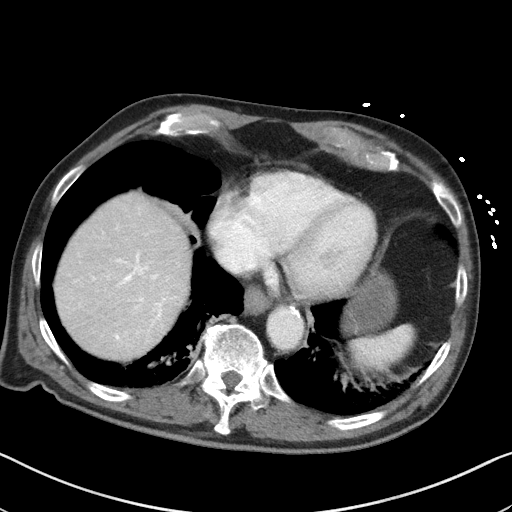
[im 84/90  soft-tissue]
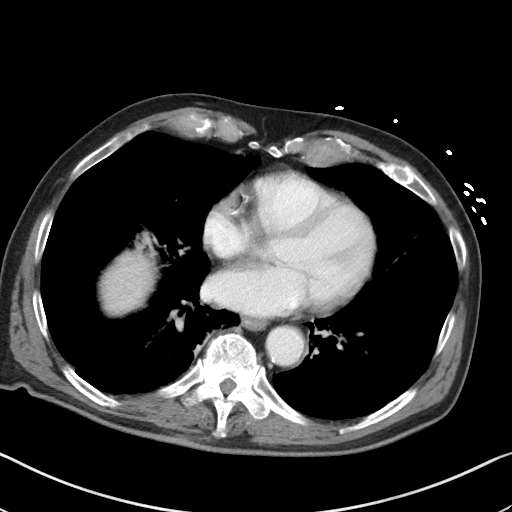

[Series 4: coronal st · coronal · 0.72mm/px · 3 of 75 slices shown]
[im 25/75  soft-tissue]
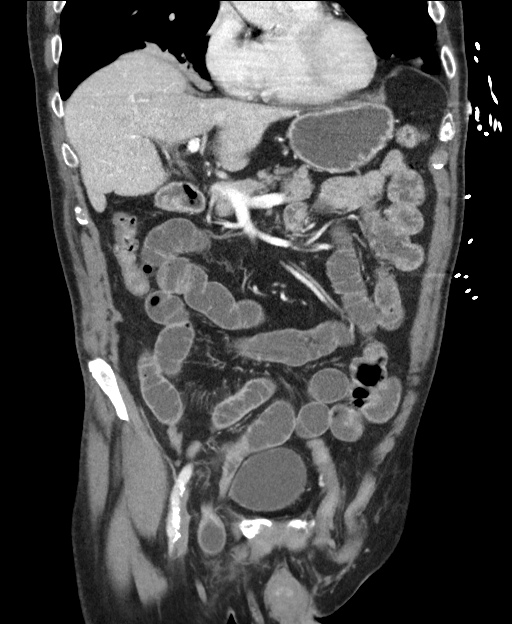
[im 33/75  soft-tissue]
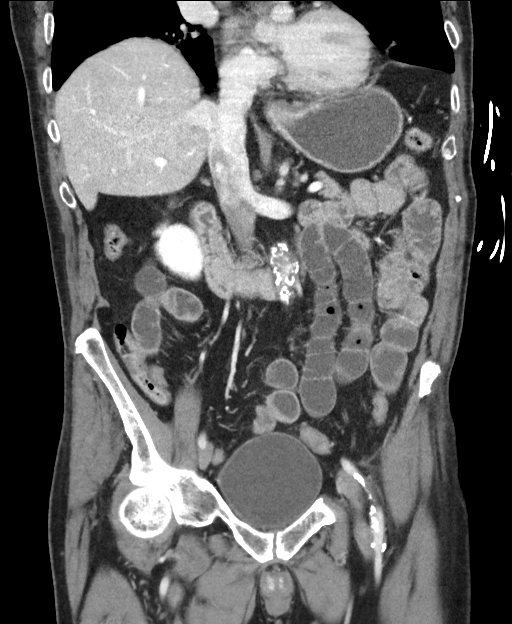
[im 42/75  soft-tissue]
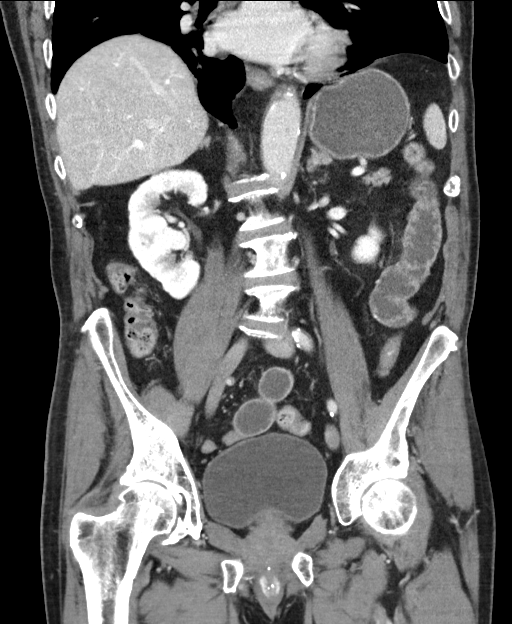

[16 of 46 positions shown; findings below may reference images not displayed]

FINDINGS: Lower chest: Advanced emphysematous changes within the visualized
lung bases. Superimposed mild bibasilar parenchymal scarring. Mild
coronary artery calcification. Global cardiac size within normal
limits.

Hepatobiliary: No focal liver abnormality is seen. No gallstones,
gallbladder wall thickening, or biliary dilatation.

Pancreas: Unremarkable

Spleen: Unremarkable

Adrenals/Urinary Tract: The adrenal glands are unremarkable. The
kidneys are normal in size and position. Simple cortical cyst noted
bilaterally. The kidneys are otherwise unremarkable. Bladder
unremarkable.

Stomach/Bowel: Bilateral small inguinal hernias are present. On the
left, the hernia sac contains a loop of unremarkable sigmoid colon.
On the right, the hernia sac contains a single loop of distal small
bowel and results in a distal small-bowel obstruction with the point
of transition seen at the internal ring. The proximal small bowel is
mildly dilated and fluid-filled. The distal small bowel and colon is
decompressed. No free intraperitoneal gas or fluid. The appendix is
absent.

Vascular/Lymphatic: Extensive mixed aortoiliac atherosclerotic
plaque. No aortic aneurysm. Moderate plaque at the proximal superior
mesenteric artery results in a roughly 50% stenosis of this vessel
focally. No pathologic adenopathy within the abdomen and pelvis.

Reproductive: Prostate is unremarkable.

Other: Rectum unremarkable.

Musculoskeletal: Degenerative changes are seen within the lumbar
spine. No acute bone abnormality.
IMPRESSION: Right inguinal hernia containing a single loop of distal small bowel
with resultant distal small bowel obstruction with point of
transition at the internal ring.

Small left inguinal hernia containing and unremarkable loop of
sigmoid colon.

Severe emphysema.

Mild coronary artery calcification.

Peripheral vascular disease with suspected 50% stenosis of the
superior mesenteric artery proximally.

Aortic Atherosclerosis (3PVO0-17Z.Z) and Emphysema (3PVO0-ZNG.1).

## 2023-01-08 ENCOUNTER — Ambulatory Visit: Payer: 59 | Admitting: Pharmacist

## 2023-01-08 NOTE — Progress Notes (Signed)
Care Management & Coordination Services Pharmacy Note  01/08/2023 Name:  Michael Evans MRN:  [id number] DOB:  04-04-1941  Summary: PharmD FU visit.  Patient had recent COPD exacerbation and dysuria.  Counseled in importance of adherence with Breztri.  He says cost is not a barrier.  Mentions slight increased mucus.  Sister knows to call Monday if not improved and come see PCP.  Urinary output has improved per patient.  Recommendations/Changes made from today's visit: No changes  Follow up plan: FU 6 months   Subjective: Michael Evans is an 82 y.o. year old male who is a primary patient of Pickard, Cammie Mcgee, MD.  The care coordination team was consulted for assistance with disease management and care coordination needs.    Engaged with patient by telephone for follow up visit.  Recent office visits:  10/16/2022 OV (PCP) [name], MD; Cipro 500 mg p.o. twice daily.    Recent consult visits:  10/21/2022 OV (Gen Surgery) [name], MD; no medication changes indicated.   Hospital visits:  None since last Adherence Cal   Objective:  Lab Results  Component Value Date   CREATININE 1.11 11/20/2022   BUN 28 (H) 11/20/2022   EGFR 73 10/04/2021   GFRNONAA >60 11/20/2022   GFRAA 95 11/11/2018   NA 140 11/20/2022   K 4.2 11/20/2022   CALCIUM 9.3 11/20/2022   CO2 27 11/20/2022   GLUCOSE 112 (H) 11/20/2022    Lab Results  Component Value Date/Time   HGBA1C 6.5 (H) 05/23/2022 07:03 PM   HGBA1C 6.7 (H) 09/26/2021 05:38 AM    Last diabetic Eye exam: No results found for: "HMDIABEYEEXA"  Last diabetic Foot exam: No results found for: "HMDIABFOOTEX"   No results found for: "CHOL", "HDL", "LDLCALC", "LDLDIRECT", "TRIG", "CHOLHDL"     Latest Ref Rng & Units 06/29/2022    2:04 PM 05/23/2022    1:19 PM 01/27/2022    3:01 PM  Hepatic Function  Total Protein 6.5 - 8.1 g/dL 6.6  7.0  7.3   Albumin 3.5 - 5.0 g/dL 3.7  3.3  4.3   AST 15 - 41 U/L '30  22  27   '$ ALT 0 -  44 U/L '21  24  25   '$ Alk Phosphatase 38 - 126 U/L 48  55  55   Total Bilirubin 0.3 - 1.2 mg/dL 0.7  0.6  0.7   Bilirubin, Direct 0.0 - 0.2 mg/dL  0.1      Lab Results  Component Value Date/Time   TSH 3.226 01/22/2016 12:12 AM       Latest Ref Rng & Units 11/20/2022    4:14 AM 11/14/2022   11:02 AM 06/29/2022    2:04 PM  CBC  WBC 4.0 - 10.5 K/uL 12.0  6.8  8.5   Hemoglobin 13.0 - 17.0 g/dL 12.7  15.3  14.9   Hematocrit 39.0 - 52.0 % 39.0  47.7  44.7   Platelets 150 - 400 K/uL 188  213  208     No results found for: "VD25OH", "VITAMINB12"  Clinical ASCVD: Yes  The ASCVD Risk score (Arnett DK, et al., 2019) failed to calculate for the following reasons:   The 2019 ASCVD risk score is only valid for ages 37 to 28        10/16/2022   11:32 AM 07/28/2022   12:32 PM 02/07/2022    8:57 AM  Depression screen PHQ 2/9  Decreased Interest 0 0 0  Down,  Depressed, Hopeless 0 0 0  PHQ - 2 Score 0 0 0     Social History   Tobacco Use  Smoking Status Former   Packs/day: 1.50   Years: 54.00   Total pack years: 81.00   Types: Cigarettes   Quit date: 12/01/2001   Years since quitting: 21.1  Smokeless Tobacco Never   BP Readings from Last 3 Encounters:  12/19/22 126/70  12/18/22 134/75  12/09/22 126/72   Pulse Readings from Last 3 Encounters:  12/19/22 65  12/18/22 68  12/09/22 61   Wt Readings from Last 3 Encounters:  12/19/22 126 lb 9.6 oz (57.4 kg)  12/18/22 130 lb (59 kg)  12/09/22 126 lb (57.2 kg)   BMI Readings from Last 3 Encounters:  12/19/22 21.07 kg/m  12/18/22 21.63 kg/m  12/09/22 20.97 kg/m    Allergies  Allergen Reactions   Benzyl Alcohol Itching   Amlodipine Swelling    Swelling in feet   Aspirin Other (See Comments)    Upset stomach.   Other Hypertension    Hazelnuts   Penicillins Hives    Has patient had a PCN reaction causing immediate rash, facial/tongue/throat swelling, SOB or lightheadedness with hypotension: Yes Has patient had a PCN  reaction causing severe rash involving mucus membranes or skin necrosis: No Has patient had a PCN reaction that required hospitalization No Has patient had a PCN reaction occurring within the last 10 years: No If all of the above answers are "NO", then may proceed with Cephalosporin use.    Sulfa Antibiotics Hives   Tylenol [Acetaminophen] Other (See Comments)    Pt reports he has had liver issues and was told not to take tylenol     Medications Reviewed Today     Reviewed by Chriss Driver, LPN (Licensed Practical Nurse) on 12/19/22 at 1117  Med List Status: <None>   Medication Order Taking? Sig Documenting Provider Last Dose Status Informant  albuterol (VENTOLIN HFA) 108 (90 Base) MCG/ACT inhaler [id number] Yes INHALE 1-2 PUFFS BY MOUTH EVERY 6 HOURS AS NEEDED FOR WHEEZE OR SHORTNESS OF BREATH  Patient taking differently: Inhale 2 puffs into the lungs every 6 (six) hours as needed for wheezing or shortness of breath.   [name], MD Taking Active Self, Pharmacy Records, Family Member  Ascorbic Acid (VITAMIN C) 1000 MG tablet [id number] Yes Take 1,000 mg by mouth daily. [provider] Taking Active Self, Pharmacy Records, Family Member  Budeson-Glycopyrrol-Formoterol (BREZTRI AEROSPHERE) 160-9-4.8 MCG/ACT AERO [id number] Yes Inhale 2 puffs into the lungs in the morning and at bedtime. [name], MD Taking Active   cholecalciferol (VITAMIN D) 25 MCG (1000 UNIT) tablet [id number] Yes Take 3,000 Units by mouth daily. [provider] Taking Active Self, Pharmacy Records, Family Member  cyanocobalamin 2000 MCG tablet [id number] Yes Take 1,000 mcg by mouth daily. [provider] Taking Active Self, Pharmacy Records, Family Member           Med Note ([place] Jan 22, 2016  9:20 AM)    Multiple Vitamin (MULTIVITAMIN) tablet [id number] Yes Take 1 tablet by mouth daily. Men's health [provider] Taking Active Self, Pharmacy Records,  Family Member  Nebulizer [place] [id number] Yes 1 each by Does not apply route daily as needed. PLEASE PROVIDE NEBULIZER KIT TO INCLUDE TUBING/MASK/MOUTHPIECE [name], MD Taking Active Self, Pharmacy Records, Family Member  Omega-3 Fatty Acids (FISH OIL CONCENTRATE PO) [id number] Yes Take 1,000 mg by mouth daily.  [provider] Taking Active Self, Pharmacy Records, Family Member  ondansetron (ZOFRAN-ODT) 4 MG disintegrating tablet [id number] Yes Take 1 tablet (4 mg total) by mouth every 6 (six) hours as needed for nausea. [name], MD Taking Active   oxyCODONE (ROXICODONE) 5 MG immediate release tablet [id number] Yes Take 1 tablet (5 mg total) by mouth every 4 (four) hours as needed for severe pain or breakthrough pain. [name], MD Taking Active   pyridoxine (B-6) 100 MG tablet [id number] Yes Take 100 mg by mouth daily. [provider] Taking Active Self, Pharmacy Records, Family Member  triamcinolone cream (KENALOG) 0.1 % [id number] Yes Apply 1 Application topically 2 (two) times daily. [name], MD Taking Active             SDOH:  (Social Determinants of Health) assessments and interventions performed: No, done within the year  Food Insecurity: No Food Insecurity (11/19/2022)   Hunger Vital Sign    Worried About Running Out of Food in the Last Year: Never true    Ran Out of Food in the Last Year: Never true    SDOH Interventions    Flowsheet Row Clinical Support from 02/07/2022 in [place] Interventions   Food Insecurity Interventions Intervention Not Indicated  Housing Interventions Intervention Not Indicated  Transportation Interventions Intervention Not Indicated  Financial Strain Interventions Intervention Not Indicated  Physical Activity Interventions Other (Comments)  [Pt states he works in yard and around house.]  Stress Interventions Intervention Not Indicated  Social Connections Interventions Other  (Comment)  [Lives with sister. Seens children often per pt.]       Medication Assistance: None required.  Patient affirms current coverage meets needs.  Medication Access: Within the past 30 days, how often has patient missed a dose of medication? 0 Is a pillbox or other method used to improve adherence? Yes  Factors that may affect medication adherence? no barriers identified Are meds synced by current pharmacy? No  Are meds delivered by current pharmacy? No  Does patient experience delays in picking up medications due to transportation concerns? No   [facility] Reviewed: Is patient disadvantaged to use [facility]?: No  Current Rx insurance plan: [facility] Name and location of Current pharmacy:  [id number] [place] [id number]: [phone]: [phone] [facility] services reviewed with patient today?: No  Patient requests to transfer care to [facility]?: No  Reason patient declined to change pharmacies: Loyalty to other pharmacy/Patient preference  Compliance/Adherence/Medication fill history: Care Gaps: Due for AWV  Star-Rating Drugs: N/A   Assessment/Plan    Hypertension (BP goal <140/90) -Controlled -Current treatment: None -Medications previously tried: hydralazine, losartan, HCTZ  -Current home readings: not checking at home -Current dietary habits: avoids salt, drinks mostly water.  Sister reports that his diet is very good and he does not eat anything with oils, etc. -Current exercise habits: walking around the house, to mailbox and back (about 400 yards) -Denies hypotensive/hypertensive symptoms -Educated on BP goals and benefits of medications for prevention of heart attack, stroke and kidney damage; Daily salt intake goal < 2300 mg; Exercise goal of 150 minutes per week; Importance of home blood pressure monitoring; -Counseled to monitor BP  at home a few times per week, document, and provide log at future appointments -Recommended to continue current medication  COPD (Goal: control symptoms and prevent exacerbations) 01/08/23 -Controlled, having  some shortness of breath -Current treatment  Breztri 160-9-4.8 mcg Appropriate, Effective, Safe, Accessible Albuterol HFA 56mg Appropriate, Effective, Safe, Accessible -Medications previously tried: Duoneb, combivent, Symbicort, Spiriva  -Gold Grade: Gold 2 (FEV1 50-79%) -Current COPD Classification:  A (low sx, <2 exacerbations/yr) -MMRC/CAT score:     11/29/2021    9:24 AM  CAT Score  Total CAT Score 25  -Pulmonary function testing: Pulmonary Functions Testing Results:  No results found for: FEV1, FVC, FEV1FVC, TLC, DLCO -Exacerbations requiring treatment in last 6 months: One  -Patient reports consistent use of maintenance inhaler -Frequency of rescue inhaler use: twice daily -Counseled on Proper inhaler technique; Benefits of consistent maintenance inhaler use When to use rescue inhaler Differences between maintenance and rescue inhalers -Confirmed he was using Breztri inhaler as prescribed.  Using twice daily as prescribed.  Counseled on importance of adherence to this.  Completed ABX course and prednisone recently for COPD exacerbation. If he has increased sputum or SOB please call or come see PCP.   Hyperglycemia (Goal: Control A1c) -Controlled -Current treatment  None -Medications previously tried: none -Steroids due to COPD exacerbations playing role in elevated sugars. Control breathing will hopefully prevent spikes in sugar with steroids.  -Recommended continue current management, no changes at thist [place] PharmD, CPP Clinical Pharmacist Practitioner [phone]

## 2023-01-12 DIAGNOSIS — J439 Emphysema, unspecified: Secondary | ICD-10-CM | POA: Diagnosis not present

## 2023-01-12 DIAGNOSIS — Z87891 Personal history of nicotine dependence: Secondary | ICD-10-CM | POA: Diagnosis not present

## 2023-01-12 DIAGNOSIS — Z9981 Dependence on supplemental oxygen: Secondary | ICD-10-CM | POA: Diagnosis not present

## 2023-01-12 DIAGNOSIS — M1990 Unspecified osteoarthritis, unspecified site: Secondary | ICD-10-CM | POA: Diagnosis not present

## 2023-01-12 DIAGNOSIS — J449 Chronic obstructive pulmonary disease, unspecified: Secondary | ICD-10-CM | POA: Diagnosis not present

## 2023-01-12 DIAGNOSIS — Z85828 Personal history of other malignant neoplasm of skin: Secondary | ICD-10-CM | POA: Diagnosis not present

## 2023-01-12 DIAGNOSIS — Z48815 Encounter for surgical aftercare following surgery on the digestive system: Secondary | ICD-10-CM | POA: Diagnosis not present

## 2023-01-12 DIAGNOSIS — G4733 Obstructive sleep apnea (adult) (pediatric): Secondary | ICD-10-CM | POA: Diagnosis not present

## 2023-01-12 DIAGNOSIS — I700 Atherosclerosis of aorta: Secondary | ICD-10-CM | POA: Diagnosis not present

## 2023-01-12 DIAGNOSIS — Z9181 History of falling: Secondary | ICD-10-CM | POA: Diagnosis not present

## 2023-01-12 DIAGNOSIS — J342 Deviated nasal septum: Secondary | ICD-10-CM | POA: Diagnosis not present

## 2023-01-12 DIAGNOSIS — I10 Essential (primary) hypertension: Secondary | ICD-10-CM | POA: Diagnosis not present

## 2023-01-12 DIAGNOSIS — Z7951 Long term (current) use of inhaled steroids: Secondary | ICD-10-CM | POA: Diagnosis not present

## 2023-01-14 ENCOUNTER — Encounter: Payer: 59 | Admitting: General Surgery

## 2023-01-20 ENCOUNTER — Other Ambulatory Visit: Payer: Self-pay

## 2023-01-20 ENCOUNTER — Encounter (HOSPITAL_COMMUNITY): Payer: Self-pay | Admitting: Emergency Medicine

## 2023-01-20 ENCOUNTER — Inpatient Hospital Stay (HOSPITAL_COMMUNITY)
Admission: EM | Admit: 2023-01-20 | Discharge: 2023-01-29 | DRG: 515 | Disposition: A | Discharge status: Skilled Nursing Facility | Payer: 59 | Attending: Family Medicine | Admitting: Family Medicine

## 2023-01-20 ENCOUNTER — Emergency Department (HOSPITAL_COMMUNITY): Payer: 59

## 2023-01-20 ENCOUNTER — Inpatient Hospital Stay (HOSPITAL_COMMUNITY): Payer: 59

## 2023-01-20 DIAGNOSIS — W11XXXA Fall on and from ladder, initial encounter: Secondary | ICD-10-CM | POA: Diagnosis present

## 2023-01-20 DIAGNOSIS — Z9181 History of falling: Secondary | ICD-10-CM | POA: Diagnosis not present

## 2023-01-20 DIAGNOSIS — Z87891 Personal history of nicotine dependence: Secondary | ICD-10-CM | POA: Diagnosis not present

## 2023-01-20 DIAGNOSIS — Z9889 Other specified postprocedural states: Secondary | ICD-10-CM

## 2023-01-20 DIAGNOSIS — Y92018 Other place in single-family (private) house as the place of occurrence of the external cause: Secondary | ICD-10-CM | POA: Diagnosis not present

## 2023-01-20 DIAGNOSIS — U071 COVID-19: Secondary | ICD-10-CM | POA: Diagnosis not present

## 2023-01-20 DIAGNOSIS — K219 Gastro-esophageal reflux disease without esophagitis: Secondary | ICD-10-CM | POA: Diagnosis present

## 2023-01-20 DIAGNOSIS — S82042D Displaced comminuted fracture of left patella, subsequent encounter for closed fracture with routine healing: Secondary | ICD-10-CM | POA: Diagnosis not present

## 2023-01-20 DIAGNOSIS — Z8616 Personal history of COVID-19: Secondary | ICD-10-CM | POA: Diagnosis not present

## 2023-01-20 DIAGNOSIS — R739 Hyperglycemia, unspecified: Secondary | ICD-10-CM | POA: Diagnosis not present

## 2023-01-20 DIAGNOSIS — T380X5A Adverse effect of glucocorticoids and synthetic analogues, initial encounter: Secondary | ICD-10-CM | POA: Diagnosis not present

## 2023-01-20 DIAGNOSIS — M1990 Unspecified osteoarthritis, unspecified site: Secondary | ICD-10-CM | POA: Diagnosis not present

## 2023-01-20 DIAGNOSIS — S80919A Unspecified superficial injury of unspecified knee, initial encounter: Secondary | ICD-10-CM | POA: Diagnosis not present

## 2023-01-20 DIAGNOSIS — D509 Iron deficiency anemia, unspecified: Secondary | ICD-10-CM | POA: Diagnosis present

## 2023-01-20 DIAGNOSIS — Z888 Allergy status to other drugs, medicaments and biological substances status: Secondary | ICD-10-CM

## 2023-01-20 DIAGNOSIS — J1282 Pneumonia due to coronavirus disease 2019: Secondary | ICD-10-CM | POA: Diagnosis not present

## 2023-01-20 DIAGNOSIS — Z9981 Dependence on supplemental oxygen: Secondary | ICD-10-CM | POA: Diagnosis not present

## 2023-01-20 DIAGNOSIS — Z882 Allergy status to sulfonamides status: Secondary | ICD-10-CM

## 2023-01-20 DIAGNOSIS — N400 Enlarged prostate without lower urinary tract symptoms: Secondary | ICD-10-CM | POA: Diagnosis not present

## 2023-01-20 DIAGNOSIS — J449 Chronic obstructive pulmonary disease, unspecified: Secondary | ICD-10-CM | POA: Diagnosis not present

## 2023-01-20 DIAGNOSIS — R079 Chest pain, unspecified: Secondary | ICD-10-CM | POA: Diagnosis not present

## 2023-01-20 DIAGNOSIS — S82042A Displaced comminuted fracture of left patella, initial encounter for closed fracture: Principal | ICD-10-CM | POA: Diagnosis present

## 2023-01-20 DIAGNOSIS — Z880 Allergy status to penicillin: Secondary | ICD-10-CM | POA: Diagnosis not present

## 2023-01-20 DIAGNOSIS — G4733 Obstructive sleep apnea (adult) (pediatric): Secondary | ICD-10-CM | POA: Diagnosis present

## 2023-01-20 DIAGNOSIS — Z886 Allergy status to analgesic agent status: Secondary | ICD-10-CM

## 2023-01-20 DIAGNOSIS — I10 Essential (primary) hypertension: Secondary | ICD-10-CM | POA: Diagnosis present

## 2023-01-20 DIAGNOSIS — J9611 Chronic respiratory failure with hypoxia: Secondary | ICD-10-CM | POA: Diagnosis not present

## 2023-01-20 DIAGNOSIS — R54 Age-related physical debility: Secondary | ICD-10-CM | POA: Diagnosis not present

## 2023-01-20 DIAGNOSIS — E876 Hypokalemia: Secondary | ICD-10-CM | POA: Diagnosis present

## 2023-01-20 DIAGNOSIS — H9190 Unspecified hearing loss, unspecified ear: Secondary | ICD-10-CM | POA: Diagnosis present

## 2023-01-20 DIAGNOSIS — J439 Emphysema, unspecified: Secondary | ICD-10-CM | POA: Diagnosis present

## 2023-01-20 DIAGNOSIS — S82002A Unspecified fracture of left patella, initial encounter for closed fracture: Secondary | ICD-10-CM | POA: Diagnosis not present

## 2023-01-20 DIAGNOSIS — Z801 Family history of malignant neoplasm of trachea, bronchus and lung: Secondary | ICD-10-CM | POA: Diagnosis not present

## 2023-01-20 DIAGNOSIS — G8918 Other acute postprocedural pain: Secondary | ICD-10-CM | POA: Diagnosis not present

## 2023-01-20 DIAGNOSIS — Z85828 Personal history of other malignant neoplasm of skin: Secondary | ICD-10-CM | POA: Diagnosis not present

## 2023-01-20 DIAGNOSIS — Z4789 Encounter for other orthopedic aftercare: Secondary | ICD-10-CM | POA: Diagnosis not present

## 2023-01-20 DIAGNOSIS — S82092A Other fracture of left patella, initial encounter for closed fracture: Secondary | ICD-10-CM | POA: Diagnosis not present

## 2023-01-20 DIAGNOSIS — W19XXXA Unspecified fall, initial encounter: Secondary | ICD-10-CM | POA: Diagnosis not present

## 2023-01-20 DIAGNOSIS — M25562 Pain in left knee: Secondary | ICD-10-CM | POA: Diagnosis not present

## 2023-01-20 LAB — CBC WITH DIFFERENTIAL/PLATELET
Abs Immature Granulocytes: 0.06 10*3/uL (ref 0.00–0.07)
Basophils Absolute: 0 10*3/uL (ref 0.0–0.1)
Basophils Relative: 0 %
Eosinophils Absolute: 0 10*3/uL (ref 0.0–0.5)
Eosinophils Relative: 0 %
HCT: 45.6 % (ref 39.0–52.0)
Hemoglobin: 15 g/dL (ref 13.0–17.0)
Immature Granulocytes: 1 %
Lymphocytes Relative: 14 %
Lymphs Abs: 1.1 10*3/uL (ref 0.7–4.0)
MCH: 29.4 pg (ref 26.0–34.0)
MCHC: 32.9 g/dL (ref 30.0–36.0)
MCV: 89.2 fL (ref 80.0–100.0)
Monocytes Absolute: 0.7 10*3/uL (ref 0.1–1.0)
Monocytes Relative: 9 %
Neutro Abs: 6 10*3/uL (ref 1.7–7.7)
Neutrophils Relative %: 76 %
Platelets: 246 10*3/uL (ref 150–400)
RBC: 5.11 MIL/uL (ref 4.22–5.81)
RDW: 13.7 % (ref 11.5–15.5)
WBC: 7.9 10*3/uL (ref 4.0–10.5)
nRBC: 0 % (ref 0.0–0.2)

## 2023-01-20 LAB — URINALYSIS, ROUTINE W REFLEX MICROSCOPIC
Bilirubin Urine: NEGATIVE
Glucose, UA: NEGATIVE mg/dL
Hgb urine dipstick: NEGATIVE
Ketones, ur: NEGATIVE mg/dL
Leukocytes,Ua: NEGATIVE
Nitrite: NEGATIVE
Protein, ur: NEGATIVE mg/dL
Specific Gravity, Urine: 1.01 (ref 1.005–1.030)
pH: 6 (ref 5.0–8.0)

## 2023-01-20 LAB — COMPREHENSIVE METABOLIC PANEL
ALT: 20 U/L (ref 0–44)
AST: 28 U/L (ref 15–41)
Albumin: 3.9 g/dL (ref 3.5–5.0)
Alkaline Phosphatase: 62 U/L (ref 38–126)
Anion gap: 9 (ref 5–15)
BUN: 31 mg/dL — ABNORMAL HIGH (ref 8–23)
CO2: 28 mmol/L (ref 22–32)
Calcium: 10.4 mg/dL — ABNORMAL HIGH (ref 8.9–10.3)
Chloride: 103 mmol/L (ref 98–111)
Creatinine, Ser: 0.96 mg/dL (ref 0.61–1.24)
GFR, Estimated: >60 mL/min (ref >60)
Glucose, Bld: 118 mg/dL — ABNORMAL HIGH (ref 70–99)
Potassium: 4.2 mmol/L (ref 3.5–5.1)
Sodium: 140 mmol/L (ref 135–145)
Total Bilirubin: 0.4 mg/dL (ref 0.3–1.2)
Total Protein: 7 g/dL (ref 6.5–8.1)

## 2023-01-20 LAB — PROTIME-INR
INR: 0.9 (ref 0.8–1.2)
Prothrombin Time: 12.3 seconds (ref 11.4–15.2)

## 2023-01-20 MED ORDER — PNEUMOCOCCAL 20-VAL CONJ VACC 0.5 ML IM SUSY: 0.5000 mL | PREFILLED_SYRINGE | INTRAMUSCULAR | Status: DC

## 2023-01-20 MED ORDER — LABETALOL HCL 5 MG/ML IV SOLN: 10.0000 mg | INTRAVENOUS | Status: DC | PRN

## 2023-01-20 MED ORDER — ACETAMINOPHEN 650 MG RE SUPP: 650.0000 mg | Freq: Four times a day (QID) | RECTAL | Status: DC | PRN

## 2023-01-20 MED ORDER — TAMSULOSIN HCL 0.4 MG PO CAPS
0.8000 mg | ORAL_CAPSULE | Freq: Every day | ORAL | Status: DC
  Administered 2023-01-21: 0.8 mg via ORAL
  Filled 2023-01-20 (×2): qty 2

## 2023-01-20 MED ORDER — MORPHINE SULFATE (PF) 2 MG/ML IV SOLN
2.0000 mg | Freq: Once | INTRAVENOUS | Status: AC
  Administered 2023-01-20: 2 mg via INTRAVENOUS
  Filled 2023-01-20: qty 1

## 2023-01-20 MED ORDER — ACETAMINOPHEN 325 MG PO TABS: 650.0000 mg | ORAL_TABLET | Freq: Four times a day (QID) | ORAL | Status: DC | PRN

## 2023-01-20 MED ORDER — ONDANSETRON HCL 4 MG/2ML IJ SOLN: 4.0000 mg | Freq: Four times a day (QID) | INTRAMUSCULAR | Status: DC | PRN

## 2023-01-20 MED ORDER — OXYCODONE HCL 5 MG PO TABS
5.0000 mg | ORAL_TABLET | Freq: Once | ORAL | Status: AC
  Administered 2023-01-20: 5 mg via ORAL
  Filled 2023-01-20: qty 1

## 2023-01-20 MED ORDER — INFLUENZA VAC A&B SA ADJ QUAD 0.5 ML IM PRSY: 0.5000 mL | PREFILLED_SYRINGE | INTRAMUSCULAR | Status: DC

## 2023-01-20 MED ORDER — POLYETHYLENE GLYCOL 3350 17 G PO PACK
17.0000 g | PACK | Freq: Two times a day (BID) | ORAL | Status: DC
  Administered 2023-01-21 – 2023-01-28 (×9): 17 g via ORAL
  Filled 2023-01-20 (×16): qty 1

## 2023-01-20 MED ORDER — ONDANSETRON HCL 4 MG PO TABS: 4.0000 mg | ORAL_TABLET | Freq: Four times a day (QID) | ORAL | Status: DC | PRN

## 2023-01-20 MED ORDER — UMECLIDINIUM BROMIDE 62.5 MCG/ACT IN AEPB
1.0000 | INHALATION_SPRAY | Freq: Every day | RESPIRATORY_TRACT | Status: DC
  Administered 2023-01-21 – 2023-01-29 (×8): 1 via RESPIRATORY_TRACT
  Filled 2023-01-20 (×2): qty 7

## 2023-01-20 MED ORDER — IPRATROPIUM-ALBUTEROL 0.5-2.5 (3) MG/3ML IN SOLN
3.0000 mL | RESPIRATORY_TRACT | Status: DC | PRN
  Administered 2023-01-23 – 2023-01-29 (×9): 3 mL via RESPIRATORY_TRACT
  Filled 2023-01-20 (×9): qty 3

## 2023-01-20 MED ORDER — BUDESON-GLYCOPYRROL-FORMOTEROL 160-9-4.8 MCG/ACT IN AERO: 2.0000 | INHALATION_SPRAY | Freq: Two times a day (BID) | RESPIRATORY_TRACT | Status: DC

## 2023-01-20 MED ORDER — MOMETASONE FURO-FORMOTEROL FUM 100-5 MCG/ACT IN AERO
2.0000 | INHALATION_SPRAY | Freq: Two times a day (BID) | RESPIRATORY_TRACT | Status: DC
  Administered 2023-01-21 – 2023-01-29 (×17): 2 via RESPIRATORY_TRACT
  Filled 2023-01-20 (×2): qty 8.8

## 2023-01-20 MED ORDER — POLYETHYLENE GLYCOL 3350 17 G PO PACK
17.0000 g | PACK | Freq: Every day | ORAL | Status: DC
  Administered 2023-01-20: 17 g via ORAL
  Filled 2023-01-20: qty 1

## 2023-01-20 MED ORDER — LACTATED RINGERS IV SOLN: INTRAVENOUS | Status: AC

## 2023-01-20 MED ORDER — OXYCODONE HCL 5 MG PO TABS: 5.0000 mg | ORAL_TABLET | ORAL | Status: DC | PRN

## 2023-01-20 MED ORDER — MORPHINE SULFATE (PF) 2 MG/ML IV SOLN
2.0000 mg | INTRAVENOUS | Status: DC | PRN
  Administered 2023-01-21 – 2023-01-28 (×8): 2 mg via INTRAVENOUS
  Filled 2023-01-20 (×9): qty 1

## 2023-01-20 NOTE — Assessment & Plan Note (Addendum)
Status post mechanical fall from ladder, patient lost his balance.  He did not hit his head.  He is not on anticoagulation.  X-ray left knee shows comminuted distracted patellar fracture. - EDP talked to Dr. Amedeo Kinsman, will see in consult in a.m, knee immobilizer. -IV morphine 2 mg every 4 hourly as needed -N.p.o. midnight - LR 75cc/hr x 1 day

## 2023-01-20 NOTE — Assessment & Plan Note (Addendum)
Resume tamsulosin.  - Med rec pending - PRN Labetalol [id number] for systolic > [id number]

## 2023-01-20 NOTE — ED Triage Notes (Signed)
Pt [facility] for fall from ladder, per pt he didn't lose footing or consciousness, denies LOC, fell approx 7 ft, striking steps with left knee, currently splinted.

## 2023-01-20 NOTE — Assessment & Plan Note (Signed)
Stable.  With chronic respiratory failure on 2 L.  Sats 94%. -Resume home regimen

## 2023-01-20 NOTE — ED Provider Notes (Signed)
[facility] Provider Note   CSN: [id number] Arrival date & time: 01/20/23  1701     History {Add pertinent medical, surgical, social history, OB history to HPI:1} Chief Complaint  Patient presents with   Michael Evans is a 82 y.o. male.   Fall  Patient presents for fall.  Medical history includes HTN, COPD, OSA, GERD, arthritis.  He is not prescribed any blood thinning medication.***.     Home Medications Prior to Admission medications   Medication Sig Start Date End Date Taking? Authorizing Provider  albuterol (VENTOLIN HFA) 108 (90 Base) MCG/ACT inhaler INHALE 1-2 PUFFS BY MOUTH EVERY 6 HOURS AS NEEDED FOR WHEEZE OR SHORTNESS OF BREATH Patient taking differently: Inhale 2 puffs into the lungs every 6 (six) hours as needed for wheezing or shortness of breath. 03/20/22   [name], MD  Ascorbic Acid (VITAMIN C) 1000 MG tablet Take 1,000 mg by mouth daily.    [provider]  Budeson-Glycopyrrol-Formoterol (BREZTRI AEROSPHERE) 160-9-4.8 MCG/ACT AERO Inhale 2 puffs into the lungs in the morning and at bedtime. 12/09/22   [name], MD  cholecalciferol (VITAMIN D) 25 MCG (1000 UNIT) tablet Take 3,000 Units by mouth daily.    [provider]  cyanocobalamin 2000 MCG tablet Take 1,000 mcg by mouth daily.    [provider]  finasteride (PROSCAR) 5 MG tablet Take 1 tablet (5 mg total) by mouth daily. 12/19/22   [name], MD  Multiple Vitamin (MULTIVITAMIN) tablet Take 1 tablet by mouth daily. Men's health    [provider]  Nebulizer MISC 1 each by Does not apply route daily as needed. PLEASE PROVIDE NEBULIZER KIT TO INCLUDE TUBING/MASK/MOUTHPIECE 10/04/21   [name], MD  Omega-3 Fatty Acids (FISH OIL CONCENTRATE PO) Take 1,000 mg by mouth daily.    [provider]  ondansetron (ZOFRAN-ODT) 4 MG disintegrating tablet Take 1 tablet (4 mg total) by mouth every 6  (six) hours as needed for nausea. 11/20/22   [name], MD  oxyCODONE (ROXICODONE) 5 MG immediate release tablet Take 1 tablet (5 mg total) by mouth every 4 (four) hours as needed for severe pain or breakthrough pain. 12/18/22   [name], MD  pyridoxine (B-6) 100 MG tablet Take 100 mg by mouth daily.    [provider]  tamsulosin (FLOMAX) 0.4 MG CAPS capsule Take 2 capsules (0.8 mg total) by mouth daily. 12/19/22   [name], MD  triamcinolone cream (KENALOG) 0.1 % Apply 1 Application topically 2 (two) times daily. 07/28/22   [name], MD      Allergies    Benzyl alcohol, Amlodipine, Aspirin, Other, Penicillins, Sulfa antibiotics, and Tylenol [acetaminophen]    Review of Systems   Review of Systems  Physical Exam Updated Vital Signs There were no vitals taken for this visit. Physical Exam  ED Results / Procedures / Treatments   Labs (all labs ordered are listed, but only abnormal results are displayed) Labs Reviewed - No data to display  EKG None  Radiology No results found.  Procedures Procedures  {Document cardiac monitor, telemetry assessment procedure when appropriate:1}  Medications Ordered in ED Medications - No data to display  ED Course/ Medical Decision Making/ A&P   {   Click here for ABCD2, HEART and other calculatorsREFRESH Note before signing :1}  Medical Decision Making  ***  {Document critical care time when appropriate:1} {Document review of labs and clinical decision tools ie heart score, Chads2Vasc2 etc:1}  {Document your independent review of radiology images, and any outside records:1} {Document your discussion with family members, caretakers, and with consultants:1} {Document social determinants of health affecting pt's care:1} {Document your decision making why or why not admission, treatments were needed:1} Final Clinical Impression(s) / ED Diagnoses Final diagnoses:  None     Rx / DC Orders ED Discharge Orders     None

## 2023-01-20 NOTE — H&P (Signed)
History and Physical    Michael Evans [id number] DOB: 17-Apr-1941 DOA: 01/20/2023  PCP: [name], MD   Patient coming from: Home  I have personally briefly reviewed patient's old medical records in [place]  Chief Complaint: Fall off Ladder  HPI: Michael Evans is a 82 y.o. male with medical history significant for COPD with chronic respiratory failure on 2 L, hypertension, BPH. Patient presented to the ED with complaints of 7 feet fall off a ladder.  Patient is hard of hearing. Patient reports he was removing a beds nest when he lost his balance and fell.  He did not hit his head.  He did not lose consciousness.  No chest pain or difficulty breathing.  ED Course: Blood pressure 171/81 otherwise stable vitals.  X-ray of the left knee shows comminuted distracted patella fracture. EDP talked to Dr. Amedeo Kinsman, considering patient's home situation, he does not have social support at home, recommended admission, knee immobilizer and will see in consult in AM.  Review of Systems: As per HPI all other systems reviewed and negative.  Past Medical History:  Diagnosis Date   Arthritis    Chronic rhinitis    Complication of anesthesia    patient usually hs to be intubated with anesthesia   COPD (chronic obstructive pulmonary disease) ([place])    PFT 06-26-09 FEV1  1.75( 71%), FVC 3.65( 101%), FEV1% 48, TLC 5.53(104%), DLCO 55%, no BD   Dyspnea    Hypertension    Nasal septal deviation    Nocturia    On home O2    2L N/C   OSA (obstructive sleep apnea) 04/21/2011   Skin cancer     Past Surgical History:  Procedure Laterality Date   APPENDECTOMY     BACK SURGERY     CATARACT EXTRACTION W/PHACO  01/06/2013   Procedure: CATARACT EXTRACTION PHACO AND INTRAOCULAR LENS PLACEMENT ([place]);  Surgeon: Tonny Branch, MD;  Location: [facility];  Service: Ophthalmology;  Laterality: Right;  CDE: 22.17   CATARACT EXTRACTION W/PHACO Left 06/20/2022   Procedure: CATARACT EXTRACTION PHACO AND  INTRAOCULAR LENS PLACEMENT ([facility]);  Surgeon: Baruch Goldmann, MD;  Location: [facility];  Service: Ophthalmology;  Laterality: Left;  CDE 20.85   FEMORAL HERNIA REPAIR Right 11/19/2022   Procedure: HERNIA REPAIR FEMORAL WITH MESH;  Surgeon: [name], MD;  Location: [facility];  Service: General;  Laterality: Right;   INGUINAL HERNIA REPAIR Left 12/09/2021   Procedure: HERNIA REPAIR INGUINAL ADULT WITH MESH;  Surgeon: [name], MD;  Location: [facility];  Service: General;  Laterality: Left;   INGUINAL HERNIA REPAIR Right 03/19/2022   Procedure: HERNIA REPAIR INGUINAL ADULT WITH MESH;  Surgeon: [name], MD;  Location: [facility];  Service: General;  Laterality: Right;     reports that he quit smoking about 21 years ago. His smoking use included cigarettes. He has a 81.00 pack-year smoking history. He has never used smokeless tobacco. He reports that he does not drink alcohol and does not use drugs.  Allergies  Allergen Reactions   Benzyl Alcohol Itching   Amlodipine Swelling    Swelling in feet   Aspirin Other (See Comments)    Upset stomach.   Other Hypertension    Hazelnuts   Penicillins Hives    Has patient had a PCN reaction causing immediate rash, facial/tongue/throat swelling, SOB or lightheadedness with hypotension: Yes Has patient had a PCN reaction causing severe rash involving mucus membranes or skin necrosis: No Has  patient had a PCN reaction that required hospitalization No Has patient had a PCN reaction occurring within the last 10 years: No If all of the above answers are "NO", then may proceed with Cephalosporin use.    Sulfa Antibiotics Hives   Tylenol [Acetaminophen] Other (See Comments)    Pt reports he has had liver issues and was told not to take tylenol     Family History  Problem Relation Age of Onset   Lung cancer Mother     Prior to Admission medications   Medication Sig Start Date End Date Taking? Authorizing Provider  albuterol (VENTOLIN HFA)  108 (90 Base) MCG/ACT inhaler INHALE 1-2 PUFFS BY MOUTH EVERY 6 HOURS AS NEEDED FOR WHEEZE OR SHORTNESS OF BREATH Patient taking differently: Inhale 2 puffs into the lungs every 6 (six) hours as needed for wheezing or shortness of breath. 03/20/22   [name], MD  Ascorbic Acid (VITAMIN C) 1000 MG tablet Take 1,000 mg by mouth daily.    [provider]  Budeson-Glycopyrrol-Formoterol (BREZTRI AEROSPHERE) 160-9-4.8 MCG/ACT AERO Inhale 2 puffs into the lungs in the morning and at bedtime. 12/09/22   [name], MD  cholecalciferol (VITAMIN D) 25 MCG (1000 UNIT) tablet Take 3,000 Units by mouth daily.    [provider]  cyanocobalamin 2000 MCG tablet Take 1,000 mcg by mouth daily.    [provider]  finasteride (PROSCAR) 5 MG tablet Take 1 tablet (5 mg total) by mouth daily. 12/19/22   [name], MD  Multiple Vitamin (MULTIVITAMIN) tablet Take 1 tablet by mouth daily. Men's health    [provider]  Nebulizer MISC 1 each by Does not apply route daily as needed. PLEASE PROVIDE NEBULIZER KIT TO INCLUDE TUBING/MASK/MOUTHPIECE 10/04/21   [name], MD  Omega-3 Fatty Acids (FISH OIL CONCENTRATE PO) Take 1,000 mg by mouth daily.    [provider]  ondansetron (ZOFRAN-ODT) 4 MG disintegrating tablet Take 1 tablet (4 mg total) by mouth every 6 (six) hours as needed for nausea. 11/20/22   [name], MD  oxyCODONE (ROXICODONE) 5 MG immediate release tablet Take 1 tablet (5 mg total) by mouth every 4 (four) hours as needed for severe pain or breakthrough pain. 12/18/22   [name], MD  pyridoxine (B-6) 100 MG tablet Take 100 mg by mouth daily.    [provider]  tamsulosin (FLOMAX) 0.4 MG CAPS capsule Take 2 capsules (0.8 mg total) by mouth daily. 12/19/22   [name], MD  triamcinolone cream (KENALOG) 0.1 % Apply 1 Application topically 2 (two) times daily. 07/28/22   [name], MD    Physical  Exam: Vitals:   01/20/23 1713 01/20/23 1713  BP:  (!) 171/81  Pulse:  84  Resp:  17  Temp:  97.7 F (36.5 C)  TempSrc:  Oral  SpO2:  94%  Weight: 57.2 kg   Height: 5' 5"$  (1.651 m)     Constitutional: NAD, calm, comfortable Vitals:   01/20/23 1713 01/20/23 1713  BP:  (!) 171/81  Pulse:  84  Resp:  17  Temp:  97.7 F (36.5 C)  TempSrc:  Oral  SpO2:  94%  Weight: 57.2 kg   Height: 5' 5"$  (1.651 m)    Eyes: PERRL, lids and conjunctivae normal ENMT: Mucous membranes are moist. .  Neck: normal, supple, no masses, no thyromegaly Respiratory: clear to auscultation bilaterally, no wheezing, no crackles. Normal respiratory effort. No accessory muscle use.  Cardiovascular: Regular rate and rhythm, no murmurs / rubs / gallops. No extremity edema.  Extremities warm. Abdomen: no tenderness, no masses palpated. No hepatosplenomegaly. Bowel sounds positive.  Musculoskeletal: no clubbing / cyanosis.  Swelling and tenderness to left knee, closed fracture.  Scratches/bruises to left lower extremity. Skin: no rashes, lesions, ulcers. No induration Neurologic: Hard of hearing, no facial asymmetry, speech clear and fluent, moving extremities spontaneously, left lower extremity not tested. Psychiatric: Normal judgment and insight. Alert and oriented x 3. Normal mood.   Labs on Admission: I have personally reviewed following labs and imaging studies  CBC: Recent Labs  Lab 01/20/23 1825  WBC 7.9  NEUTROABS 6.0  HGB 15.0  HCT 45.6  MCV 89.2  PLT 0000000   Basic Metabolic Panel: Recent Labs  Lab 01/20/23 1825  NA 140  K 4.2  CL 103  CO2 28  GLUCOSE 118*  BUN 31*  CREATININE 0.96  CALCIUM 10.4*   GFR: Estimated Creatinine Clearance: 46.3 mL/min (by C-G formula based on SCr of 0.96 mg/dL). Liver Function Tests: Recent Labs  Lab 01/20/23 1825  AST 28  ALT 20  ALKPHOS 62  BILITOT 0.4  PROT 7.0  ALBUMIN 3.9   Coagulation Profile: Recent Labs  Lab 01/20/23 1825  INR 0.9    Urine analysis:    Component Value Date/Time   COLORURINE YELLOW (A) 01/20/2023 1925   APPEARANCEUR CLEAR 01/20/2023 1925   LABSPEC 1.010 01/20/2023 1925   PHURINE 6.0 01/20/2023 1925   GLUCOSEU NEGATIVE 01/20/2023 1925   HGBUR NEGATIVE 01/20/2023 1925   BILIRUBINUR NEGATIVE 01/20/2023 1925   KETONESUR NEGATIVE 01/20/2023 1925   PROTEINUR NEGATIVE 01/20/2023 1925   NITRITE NEGATIVE 01/20/2023 1925   LEUKOCYTESUR NEGATIVE 01/20/2023 1925    Radiological Exams on Admission: DG Chest 1 View  Result Date: 01/20/2023 CLINICAL DATA:  Fall, pt on O2, left knee pain EXAM: CHEST  1 VIEW COMPARISON:  07/28/2022 FINDINGS: Lungs are clear. Heart size and mediastinal contours are within normal limits. Aortic Atherosclerosis (ICD10-170.0). No effusion. Visualized bones unremarkable. IMPRESSION: No acute cardiopulmonary disease. Electronically Signed   By: Lucrezia Europe M.D.   On: 01/20/2023 18:21   DG Knee Complete 4 Views Left  Result Date: 01/20/2023 CLINICAL DATA:  Pain post fall EXAM: LEFT KNEE - COMPLETE 4+ VIEW FINDINGS: Comminuted patellar fracture, dominant fracture fragments distracted approximately 6 cm. The distal femur and proximal tibia appear intact. IMPRESSION: Comminuted, distracted patellar fracture. Electronically Signed   By: Lucrezia Europe M.D.   On: 01/20/2023 18:21    EKG: Pending  Assessment/Plan Principal Problem:   Comminuted fracture of left patella Active Problems:   Essential hypertension   COPD with emphysema (HCC)   Chronic respiratory failure with hypoxia (HCC)   Assessment and Plan: * Comminuted fracture of left patella Status post mechanical fall from ladder, patient lost his balance.  He did not hit his head.  He is not on anticoagulation.  X-ray left knee shows comminuted distracted patellar fracture. - EDP talked to Dr. Amedeo Kinsman, will see in consult in a.m, knee immobilizer. -IV morphine 2 mg every 4 hourly as needed -N.p.o. midnight - LR 75cc/hr x 1  day  COPD with emphysema (HCC) Stable.  With chronic respiratory failure on 2 L.  Sats 94%. -Resume home regimen  Essential hypertension Resume tamsulosin.  - Med rec pending - PRN Labetalol [id number] for systolic > [id number]   DVT prophylaxis:  SCDs Code Status:  FULL code Family Communication:  None at bedside Disposition  Plan: ~ 2 days Consults called: Ortho- Dr. Amedeo Kinsman Admission status: Inpt Medsurg I certify that at the point of admission it is my clinical judgment that the patient will require inpatient hospital care spanning beyond 2 midnights from the point of admission due to high intensity of service, high risk for further deterioration and high frequency of surveillance required.   Author: Bethena Roys, MD 01/20/2023 9:08 PM  For on call review [email or web address].

## 2023-01-21 DIAGNOSIS — S82042A Displaced comminuted fracture of left patella, initial encounter for closed fracture: Secondary | ICD-10-CM | POA: Diagnosis not present

## 2023-01-21 LAB — BASIC METABOLIC PANEL
Anion gap: 5 (ref 5–15)
BUN: 26 mg/dL — ABNORMAL HIGH (ref 8–23)
CO2: 29 mmol/L (ref 22–32)
Calcium: 9.3 mg/dL (ref 8.9–10.3)
Chloride: 106 mmol/L (ref 98–111)
Creatinine, Ser: 0.97 mg/dL (ref 0.61–1.24)
GFR, Estimated: >60 mL/min (ref >60)
Glucose, Bld: 102 mg/dL — ABNORMAL HIGH (ref 70–99)
Potassium: 3.6 mmol/L (ref 3.5–5.1)
Sodium: 140 mmol/L (ref 135–145)

## 2023-01-21 LAB — CBC
HCT: 38.3 % — ABNORMAL LOW (ref 39.0–52.0)
Hemoglobin: 12.3 g/dL — ABNORMAL LOW (ref 13.0–17.0)
MCH: 29.1 pg (ref 26.0–34.0)
MCHC: 32.1 g/dL (ref 30.0–36.0)
MCV: 90.8 fL (ref 80.0–100.0)
Platelets: 230 10*3/uL (ref 150–400)
RBC: 4.22 MIL/uL (ref 4.22–5.81)
RDW: 13.6 % (ref 11.5–15.5)
WBC: 10.3 10*3/uL (ref 4.0–10.5)
nRBC: 0 % (ref 0.0–0.2)

## 2023-01-21 MED ORDER — CEFAZOLIN SODIUM-DEXTROSE 2-4 GM/100ML-% IV SOLN
2.0000 g | INTRAVENOUS | Status: AC
  Administered 2023-01-22: 2 g via INTRAVENOUS

## 2023-01-21 MED ORDER — FINASTERIDE 5 MG PO TABS
5.0000 mg | ORAL_TABLET | Freq: Every day | ORAL | Status: DC
  Filled 2023-01-21: qty 1

## 2023-01-21 MED ORDER — TRANEXAMIC ACID-NACL 1000-0.7 MG/100ML-% IV SOLN
1000.0000 mg | INTRAVENOUS | Status: AC
  Administered 2023-01-22: 1000 mg via INTRAVENOUS

## 2023-01-21 NOTE — Consult Note (Signed)
ORTHOPAEDIC CONSULTATION  REQUESTING PHYSICIAN: Domenic Polite, MD  ASSESSMENT AND PLAN: 82 y.o. male with the following: Comminuted left patella fracture  Orthopedics recommends admission to a medical service and we will provide consultation and follow along  Patient to remain in a knee immobilizer.  Proceed with ORIF L patella 01/22/2023  - Weight Bearing Status/Activity: NWB LLE  - Additional recommended labs/tests: CBC, BMP, Chest XR, PT/INR, EKG  -VTE Prophylaxis: At discretion of primary team  - Pain control: As needed  - Follow-up plan: 2 weeks postop  -Procedures: ORIF Left patella  Chief Complaint: Left knee injury   HPI: Michael Evans is a 82 y.o. male with past medical history as listed below who presented to the ED last night after falling off a ladder.  He states he fell off three steps and landed on his knee, directly on to concrete. Presented via EMS.  No pain elsewhere.  He lives at home with his sister.  No numbness or tingling.  He did not hit his head.   Past Medical History:  Diagnosis Date   Arthritis    Chronic rhinitis    Complication of anesthesia    patient usually hs to be intubated with anesthesia   COPD (chronic obstructive pulmonary disease) ([place])    PFT 06-26-09 FEV1  1.75( 71%), FVC 3.65( 101%), FEV1% 48, TLC 5.53(104%), DLCO 55%, no BD   Dyspnea    Hypertension    Nasal septal deviation    Nocturia    On home O2    2L N/C   OSA (obstructive sleep apnea) 04/21/2011   Skin cancer    Past Surgical History:  Procedure Laterality Date   APPENDECTOMY     BACK SURGERY     CATARACT EXTRACTION W/PHACO  01/06/2013   Procedure: CATARACT EXTRACTION PHACO AND INTRAOCULAR LENS PLACEMENT ([place]);  Surgeon: Tonny Branch, MD;  Location: [facility];  Service: Ophthalmology;  Laterality: Right;  CDE: 22.17   CATARACT EXTRACTION W/PHACO Left 06/20/2022   Procedure: CATARACT EXTRACTION PHACO AND INTRAOCULAR LENS PLACEMENT ([facility]);  Surgeon: Baruch Goldmann, MD;   Location: [facility];  Service: Ophthalmology;  Laterality: Left;  CDE 20.85   FEMORAL HERNIA REPAIR Right 11/19/2022   Procedure: HERNIA REPAIR FEMORAL WITH MESH;  Surgeon: [name], MD;  Location: [facility];  Service: General;  Laterality: Right;   INGUINAL HERNIA REPAIR Left 12/09/2021   Procedure: HERNIA REPAIR INGUINAL ADULT WITH MESH;  Surgeon: [name], MD;  Location: [facility];  Service: General;  Laterality: Left;   INGUINAL HERNIA REPAIR Right 03/19/2022   Procedure: HERNIA REPAIR INGUINAL ADULT WITH MESH;  Surgeon: [name], MD;  Location: [facility];  Service: General;  Laterality: Right;   Social History   Socioeconomic History   Marital status: Divorced    Spouse name: Not on file   Number of children: 3   Years of education: Not on file   Highest education level: Not on file  Occupational History   Occupation: Neurosurgeon: RETIRED  Tobacco Use   Smoking status: Former    Packs/day: 1.50    Years: 54.00    Total pack years: 81.00    Types: Cigarettes    Quit date: 12/01/2001    Years since quitting: 21.1   Smokeless tobacco: Never  Vaping Use   Vaping Use: Never used  Substance and Sexual Activity   Alcohol use: No   Drug use: No   Sexual activity: Not  on file  Other Topics Concern   Not on file  Social History Narrative   Lives with sister.    Social Determinants of Health   Financial Resource Strain: Low Risk  (02/07/2022)   Overall Financial Resource Strain (CARDIA)    Difficulty of Paying Living Expenses: Not hard at all  Food Insecurity: No Food Insecurity (01/20/2023)   Hunger Vital Sign    Worried About Running Out of Food in the Last Year: Never true    Ran Out of Food in the Last Year: Never true  Transportation Needs: No Transportation Needs (01/20/2023)   PRAPARE - Hydrologist (Medical): No    Lack of Transportation (Non-Medical): No  Physical Activity: Insufficiently Active (02/07/2022)    Exercise Vital Sign    Days of Exercise per Week: 3 days    Minutes of Exercise per Session: 20 min  Stress: No Stress Concern Present (02/07/2022)   [place]    Feeling of Stress : Not at all  Social Connections: Socially Isolated (02/07/2022)   Social Connection and Isolation Panel [NHANES]    Frequency of Communication with Friends and Family: More than three times a week    Frequency of Social Gatherings with Friends and Family: More than three times a week    Attends Religious Services: Never    Marine scientist or Organizations: No    Attends Music therapist: Never    Marital Status: Divorced   Family History  Problem Relation Age of Onset   Lung cancer Mother    Allergies  Allergen Reactions   Benzyl Alcohol Itching   Amlodipine Swelling    Swelling in feet   Aspirin Other (See Comments)    Upset stomach.   Other Hypertension    Hazelnuts   Penicillins Hives    Has patient had a PCN reaction causing immediate rash, facial/tongue/throat swelling, SOB or lightheadedness with hypotension: Yes Has patient had a PCN reaction causing severe rash involving mucus membranes or skin necrosis: No Has patient had a PCN reaction that required hospitalization No Has patient had a PCN reaction occurring within the last 10 years: No If all of the above answers are "NO", then may proceed with Cephalosporin use.    Sulfa Antibiotics Hives   Tylenol [Acetaminophen] Other (See Comments)    Pt reports he has had liver issues and was told not to take tylenol    Prior to Admission medications   Medication Sig Start Date End Date Taking? Authorizing Provider  albuterol (VENTOLIN HFA) 108 (90 Base) MCG/ACT inhaler INHALE 1-2 PUFFS BY MOUTH EVERY 6 HOURS AS NEEDED FOR WHEEZE OR SHORTNESS OF BREATH Patient taking differently: Inhale 2 puffs into the lungs every 6 (six) hours as needed for wheezing or shortness  of breath. 03/20/22  Yes [name], MD  Ascorbic Acid (VITAMIN C) 1000 MG tablet Take 1,000 mg by mouth daily.   Yes [provider]  Budeson-Glycopyrrol-Formoterol (BREZTRI AEROSPHERE) 160-9-4.8 MCG/ACT AERO Inhale 2 puffs into the lungs in the morning and at bedtime. 12/09/22  Yes [name], MD  cholecalciferol (VITAMIN D) 25 MCG (1000 UNIT) tablet Take 3,000 Units by mouth daily.   Yes [provider]  cyanocobalamin 2000 MCG tablet Take 1,000 mcg by mouth daily.   Yes [provider]  finasteride (PROSCAR) 5 MG tablet Take 1 tablet (5 mg total) by mouth daily. 12/19/22  Yes Pickard,  Cammie Mcgee, MD  Multiple Vitamin (MULTIVITAMIN) tablet Take 1 tablet by mouth daily. Men's health   Yes [provider]  Omega-3 Fatty Acids (FISH OIL CONCENTRATE PO) Take 1,000 mg by mouth daily.   Yes [provider]  pyridoxine (B-6) 100 MG tablet Take 100 mg by mouth daily.   Yes [provider]  tamsulosin (FLOMAX) 0.4 MG CAPS capsule Take 2 capsules (0.8 mg total) by mouth daily. 12/19/22  Yes [name], MD  Nebulizer MISC 1 each by Does not apply route daily as needed. PLEASE PROVIDE NEBULIZER KIT TO INCLUDE TUBING/MASK/MOUTHPIECE 10/04/21   [name], MD  ondansetron (ZOFRAN-ODT) 4 MG disintegrating tablet Take 1 tablet (4 mg total) by mouth every 6 (six) hours as needed for nausea. Patient not taking: Reported on 01/20/2023 11/20/22   [name], MD  oxyCODONE (ROXICODONE) 5 MG immediate release tablet Take 1 tablet (5 mg total) by mouth every 4 (four) hours as needed for severe pain or breakthrough pain. Patient not taking: Reported on 01/20/2023 12/18/22   [name], MD   DG Knee 2 Views Left  Result Date: 01/20/2023 CLINICAL DATA:  Patellar fracture. EXAM: LEFT KNEE - 1-2 VIEW COMPARISON:  None Available. FINDINGS: Status post knee immobilization. There is a comminuted distracted patellar fracture now  approximately 3.1 cm distraction between the fracture fragments. Marked soft tissue swelling and edema about the anterior aspect of the patella. IMPRESSION: Comminuted distracted patellar fracture status post immobilization. Electronically Signed   By: Keane Police D.O.   On: 01/20/2023 23:08   DG Chest 1 View  Result Date: 01/20/2023 CLINICAL DATA:  Fall, pt on O2, left knee pain EXAM: CHEST  1 VIEW COMPARISON:  07/28/2022 FINDINGS: Lungs are clear. Heart size and mediastinal contours are within normal limits. Aortic Atherosclerosis (ICD10-170.0). No effusion. Visualized bones unremarkable. IMPRESSION: No acute cardiopulmonary disease. Electronically Signed   By: Lucrezia Europe M.D.   On: 01/20/2023 18:21   DG Knee Complete 4 Views Left  Result Date: 01/20/2023 CLINICAL DATA:  Pain post fall EXAM: LEFT KNEE - COMPLETE 4+ VIEW FINDINGS: Comminuted patellar fracture, dominant fracture fragments distracted approximately 6 cm. The distal femur and proximal tibia appear intact. IMPRESSION: Comminuted, distracted patellar fracture. Electronically Signed   By: Lucrezia Europe M.D.   On: 01/20/2023 18:21    Family History Reviewed and non-contributory, no pertinent history of problems with bleeding or anesthesia    Review of Systems No fevers or chills No numbness or tingling No chest pain No shortness of breath No bowel or bladder dysfunction No GI distress No headaches    OBJECTIVE  Vitals:Patient Vitals for the past 8 hrs:  BP Temp Pulse Resp SpO2  01/21/23 1248 127/66 98.5 F (36.9 C) 72 18 96 %   General: Alert, no acute distress Cardiovascular: Warm extremities noted Respiratory: No cyanosis, no use of accessory musculature GI: No organomegaly, abdomen is soft and non-tender Skin: No lesions in the area of chief complaint other than those listed below in MSK exam.  Neurologic: Sensation intact distally save for the below mentioned MSK exam Psychiatric: Patient is competent for consent  with normal mood and affect Lymphatic: No swelling obvious and reported other than the area involved in the exam below Extremities   [id number] knee with effusion.  Tenderness to palpation over the knee.  Held in slightly flexed position.  Defect to mid patella.  Sensation intact distally.  Toes are warm and well perfused.  This  male    Test Results Imaging Comminuted fracture of the left patella with wide distraction.   Labs cbc Recent Labs    01/20/23 1825 01/21/23 0500  WBC 7.9 10.3  HGB 15.0 12.3*  HCT 45.6 38.3*  PLT 246 230    Labs coag Recent Labs    01/20/23 1825  INR 0.9    Recent Labs    01/20/23 1825 01/21/23 0500  NA 140 140  K 4.2 3.6  CL 103 106  CO2 28 29  GLUCOSE 118* 102*  BUN 31* 26*  CREATININE 0.96 0.97  CALCIUM 10.4* 9.3

## 2023-01-21 NOTE — Progress Notes (Signed)
  Transition of Care [facility]) Screening Note   Patient Details  Name: Michael Evans Date of Birth: 07/15/1941   [facility]) CM/SW Contact:    Iona Beard, [place] Phone Number: 01/21/2023, 10:35 AM    Transition of Care Department [facility]) has reviewed patient and no TOC needs have been identified at this time. We will continue to monitor patient advancement through interdisciplinary progression rounds. If new patient transition needs arise, please place a TOC consult.

## 2023-01-21 NOTE — Plan of Care (Signed)
  Problem: Education: Goal: Knowledge of General Education information will improve Description Including pain rating scale, medication(s)/side effects and non-pharmacologic comfort measures Outcome: Progressing   

## 2023-01-21 NOTE — Progress Notes (Signed)
PROGRESS NOTE    Michael Evans  [id number] DOB: 15-Feb-1941 DOA: 01/20/2023 PCP: [name], MD  82/M with history of COPD, chronic respiratory failure on 2 L home O2, hypertension, BPH, presented to the ED after a fall from 7 feet off a ladder, reportedly lost his balance and fell, did not hit his head, LOC no loss of consciousness, developed severe left knee pain, x-rays noted comminuted distracted patella fracture and blood pressure of 171/81, on account of age and frailty he was admitted overnight, with plan for orthopedic consult today  Subjective: -Has pain in his leg at patella, no other issues, breathing is stable  Assessment and Plan:  Comminuted fracture of left patella -Following mechanical fall, X-ray left knee shows comminuted distracted patellar fracture. -Left knee in immobilizer at this time, orthopedic consult pending -Pain control -Will need PT eval pending Ortho recommendations  COPD with emphysema ([place]) -  With chronic respiratory failure on 2 L.  Stable -Resume home regimen  BPH -Resume Flomax and Proscar  DVT prophylaxis: SCDs Code Status: Full code Family Communication: No family at bedside Disposition Plan: To be determined  Consultants: Orthopedics Dr. Amedeo Kinsman   Procedures:   Antimicrobials:    Objective: Vitals:   01/20/23 1713 01/20/23 2229 01/21/23 0303 01/21/23 0733  BP: (!) 171/81 (!) 167/84 (!) 117/59   Pulse: 84 63 78   Resp: 17 20 16   $ Temp: 97.7 F (36.5 C) 97.8 F (36.6 C) 98.4 F (36.9 C)   TempSrc: Oral Oral Oral   SpO2: 94% 100% 95% 95%  Weight:      Height:        Intake/Output Summary (Last 24 hours) at 01/21/2023 1024 Last data filed at 01/21/2023 1000 Gross per 24 hour  Intake 411.88 ml  Output 675 ml  Net -263.12 ml   Filed Weights   01/20/23 1713  Weight: 57.2 kg    Examination:  General exam: Chronically ill male, AAOx3, no distress CVS: S1-S2, regular rhythm Lungs: Poor air movement  bilaterally Abdomen: Soft, nontender, bowel sounds present Extremities: No edema, left knee in immobilizer Skin: No rashes Psychiatry:  Mood & affect appropriate.     Data Reviewed:   CBC: Recent Labs  Lab 01/20/23 1825 01/21/23 0500  WBC 7.9 10.3  NEUTROABS 6.0  --   HGB 15.0 12.3*  HCT 45.6 38.3*  MCV 89.2 90.8  PLT 246 [id number]   Basic Metabolic Panel: Recent Labs  Lab 01/20/23 1825 01/21/23 0500  NA 140 140  K 4.2 3.6  CL 103 106  CO2 28 29  GLUCOSE 118* 102*  BUN 31* 26*  CREATININE 0.96 0.97  CALCIUM 10.4* 9.3   GFR: Estimated Creatinine Clearance: 45.9 mL/min (by C-G formula based on SCr of 0.97 mg/dL). Liver Function Tests: Recent Labs  Lab 01/20/23 1825  AST 28  ALT 20  ALKPHOS 62  BILITOT 0.4  PROT 7.0  ALBUMIN 3.9   No results for input(s): "LIPASE", "AMYLASE" in the last 168 hours. No results for input(s): "AMMONIA" in the last 168 hours. Coagulation Profile: Recent Labs  Lab 01/20/23 1825  INR 0.9   Cardiac Enzymes: No results for input(s): "CKTOTAL", "CKMB", "CKMBINDEX", "TROPONINI" in the last 168 hours. BNP (last 3 results) No results for input(s): "PROBNP" in the last 8760 hours. HbA1C: No results for input(s): "HGBA1C" in the last 72 hours. CBG: No results for input(s): "GLUCAP" in the last 168 hours. Lipid Profile: No results for input(s): "CHOL", "HDL", "LDLCALC", "TRIG", "  CHOLHDL", "LDLDIRECT" in the last 72 hours. Thyroid Function Tests: No results for input(s): "TSH", "T4TOTAL", "FREET4", "T3FREE", "THYROIDAB" in the last 72 hours. Anemia Panel: No results for input(s): "VITAMINB12", "FOLATE", "FERRITIN", "TIBC", "IRON", "RETICCTPCT" in the last 72 hours. Urine analysis:    Component Value Date/Time   COLORURINE YELLOW (A) 01/20/2023 1925   APPEARANCEUR CLEAR 01/20/2023 1925   LABSPEC 1.010 01/20/2023 1925   PHURINE 6.0 01/20/2023 1925   GLUCOSEU NEGATIVE 01/20/2023 1925   HGBUR NEGATIVE 01/20/2023 1925   BILIRUBINUR  NEGATIVE 01/20/2023 1925   KETONESUR NEGATIVE 01/20/2023 1925   PROTEINUR NEGATIVE 01/20/2023 1925   NITRITE NEGATIVE 01/20/2023 1925   LEUKOCYTESUR NEGATIVE 01/20/2023 1925   Sepsis Labs: @LABRCNTIP$ (procalcitonin:4,lacticidven:4)  )No results found for this or any previous visit (from the past 240 hour(s)).   Radiology Studies: DG Knee 2 Views Left  Result Date: 01/20/2023 CLINICAL DATA:  Patellar fracture. EXAM: LEFT KNEE - 1-2 VIEW COMPARISON:  None Available. FINDINGS: Status post knee immobilization. There is a comminuted distracted patellar fracture now approximately 3.1 cm distraction between the fracture fragments. Marked soft tissue swelling and edema about the anterior aspect of the patella. IMPRESSION: Comminuted distracted patellar fracture status post immobilization. Electronically Signed   By: Keane Police D.O.   On: 01/20/2023 23:08   DG Chest 1 View  Result Date: 01/20/2023 CLINICAL DATA:  Fall, pt on O2, left knee pain EXAM: CHEST  1 VIEW COMPARISON:  07/28/2022 FINDINGS: Lungs are clear. Heart size and mediastinal contours are within normal limits. Aortic Atherosclerosis (ICD10-170.0). No effusion. Visualized bones unremarkable. IMPRESSION: No acute cardiopulmonary disease. Electronically Signed   By: Lucrezia Europe M.D.   On: 01/20/2023 18:21   DG Knee Complete 4 Views Left  Result Date: 01/20/2023 CLINICAL DATA:  Pain post fall EXAM: LEFT KNEE - COMPLETE 4+ VIEW FINDINGS: Comminuted patellar fracture, dominant fracture fragments distracted approximately 6 cm. The distal femur and proximal tibia appear intact. IMPRESSION: Comminuted, distracted patellar fracture. Electronically Signed   By: Lucrezia Europe M.D.   On: 01/20/2023 18:21     Scheduled Meds:  influenza vaccine adjuvanted  0.5 mL Intramuscular Tomorrow-1000   mometasone-formoterol  2 puff Inhalation BID   pneumococcal 20-valent conjugate vaccine  0.5 mL Intramuscular Tomorrow-1000   polyethylene glycol  17 g Oral BID    tamsulosin  0.8 mg Oral Daily   umeclidinium bromide  1 puff Inhalation Daily   Continuous Infusions:  lactated ringers 75 mL/hr at 01/21/23 0448     LOS: 1 day    Time spent: 27mn    PDomenic Polite MD [facility]   01/21/2023, 10:24 AM

## 2023-01-22 ENCOUNTER — Inpatient Hospital Stay (HOSPITAL_COMMUNITY): Payer: 59 | Admitting: Anesthesiology

## 2023-01-22 ENCOUNTER — Encounter (HOSPITAL_COMMUNITY): Payer: Self-pay | Admitting: Internal Medicine

## 2023-01-22 ENCOUNTER — Inpatient Hospital Stay (HOSPITAL_COMMUNITY): Payer: 59

## 2023-01-22 ENCOUNTER — Encounter (HOSPITAL_COMMUNITY)
Admission: EM | Disposition: A | Discharge status: Skilled Nursing Facility | Payer: Self-pay | Source: Home / Self Care | Attending: Internal Medicine

## 2023-01-22 ENCOUNTER — Other Ambulatory Visit: Payer: Self-pay

## 2023-01-22 DIAGNOSIS — N400 Enlarged prostate without lower urinary tract symptoms: Secondary | ICD-10-CM

## 2023-01-22 DIAGNOSIS — I10 Essential (primary) hypertension: Secondary | ICD-10-CM

## 2023-01-22 DIAGNOSIS — J449 Chronic obstructive pulmonary disease, unspecified: Secondary | ICD-10-CM

## 2023-01-22 DIAGNOSIS — S82002A Unspecified fracture of left patella, initial encounter for closed fracture: Secondary | ICD-10-CM

## 2023-01-22 DIAGNOSIS — Z87891 Personal history of nicotine dependence: Secondary | ICD-10-CM

## 2023-01-22 DIAGNOSIS — J9611 Chronic respiratory failure with hypoxia: Secondary | ICD-10-CM | POA: Diagnosis not present

## 2023-01-22 DIAGNOSIS — S82042A Displaced comminuted fracture of left patella, initial encounter for closed fracture: Secondary | ICD-10-CM | POA: Diagnosis not present

## 2023-01-22 DIAGNOSIS — J439 Emphysema, unspecified: Secondary | ICD-10-CM | POA: Diagnosis not present

## 2023-01-22 HISTORY — PX: ORIF PATELLA: SHX5033

## 2023-01-22 LAB — BASIC METABOLIC PANEL
Anion gap: 6 (ref 5–15)
BUN: 21 mg/dL (ref 8–23)
CO2: 27 mmol/L (ref 22–32)
Calcium: 8.7 mg/dL — ABNORMAL LOW (ref 8.9–10.3)
Chloride: 103 mmol/L (ref 98–111)
Creatinine, Ser: 0.89 mg/dL (ref 0.61–1.24)
GFR, Estimated: >60 mL/min (ref >60)
Glucose, Bld: 135 mg/dL — ABNORMAL HIGH (ref 70–99)
Potassium: 3.7 mmol/L (ref 3.5–5.1)
Sodium: 136 mmol/L (ref 135–145)

## 2023-01-22 LAB — CBC
HCT: 35.9 % — ABNORMAL LOW (ref 39.0–52.0)
Hemoglobin: 11.6 g/dL — ABNORMAL LOW (ref 13.0–17.0)
MCH: 29.1 pg (ref 26.0–34.0)
MCHC: 32.3 g/dL (ref 30.0–36.0)
MCV: 90.2 fL (ref 80.0–100.0)
Platelets: 203 10*3/uL (ref 150–400)
RBC: 3.98 MIL/uL — ABNORMAL LOW (ref 4.22–5.81)
RDW: 13.4 % (ref 11.5–15.5)
WBC: 10.9 10*3/uL — ABNORMAL HIGH (ref 4.0–10.5)
nRBC: 0 % (ref 0.0–0.2)

## 2023-01-22 LAB — SURGICAL PCR SCREEN
MRSA, PCR: NEGATIVE
Staphylococcus aureus: NEGATIVE

## 2023-01-22 LAB — GLUCOSE, CAPILLARY: Glucose-Capillary: 177 mg/dL — ABNORMAL HIGH (ref 70–99)

## 2023-01-22 SURGERY — OPEN REDUCTION INTERNAL FIXATION (ORIF) PATELLA
Anesthesia: General | Site: Knee | Laterality: Left

## 2023-01-22 MED ORDER — FINASTERIDE 5 MG PO TABS
5.0000 mg | ORAL_TABLET | Freq: Every day | ORAL | Status: DC
  Administered 2023-01-22 – 2023-01-28 (×7): 5 mg via ORAL
  Filled 2023-01-22 (×7): qty 1

## 2023-01-22 MED ORDER — ONDANSETRON HCL 4 MG/2ML IJ SOLN
INTRAMUSCULAR | Status: AC
  Filled 2023-01-22: qty 4

## 2023-01-22 MED ORDER — CHLORHEXIDINE GLUCONATE 0.12 % MT SOLN
15.0000 mL | Freq: Once | OROMUCOSAL | Status: AC
  Administered 2023-01-22: 15 mL via OROMUCOSAL

## 2023-01-22 MED ORDER — TAMSULOSIN HCL 0.4 MG PO CAPS
0.8000 mg | ORAL_CAPSULE | Freq: Every day | ORAL | Status: DC
  Administered 2023-01-22 – 2023-01-28 (×7): 0.8 mg via ORAL
  Filled 2023-01-22 (×7): qty 2

## 2023-01-22 MED ORDER — LACTATED RINGERS IV SOLN: INTRAVENOUS | Status: DC

## 2023-01-22 MED ORDER — PHENYLEPHRINE 80 MCG/ML (10ML) SYRINGE FOR IV PUSH (FOR BLOOD PRESSURE SUPPORT)
PREFILLED_SYRINGE | INTRAVENOUS | Status: AC
  Filled 2023-01-22: qty 30

## 2023-01-22 MED ORDER — FENTANYL CITRATE (PF) 100 MCG/2ML IJ SOLN
INTRAMUSCULAR | Status: DC | PRN
  Administered 2023-01-22: 50 ug via INTRAVENOUS
  Administered 2023-01-22 (×2): 25 ug via INTRAVENOUS

## 2023-01-22 MED ORDER — EPHEDRINE SULFATE-NACL 50-0.9 MG/10ML-% IV SOSY
PREFILLED_SYRINGE | INTRAVENOUS | Status: DC | PRN
  Administered 2023-01-22: 10 mg via INTRAVENOUS

## 2023-01-22 MED ORDER — ORAL CARE MOUTH RINSE: 15.0000 mL | Freq: Once | OROMUCOSAL | Status: AC

## 2023-01-22 MED ORDER — CEFAZOLIN SODIUM-DEXTROSE 2-4 GM/100ML-% IV SOLN
INTRAVENOUS | Status: AC
  Filled 2023-01-22: qty 100

## 2023-01-22 MED ORDER — BUPIVACAINE-EPINEPHRINE (PF) 0.5% -1:200000 IJ SOLN
INTRAMUSCULAR | Status: AC
  Filled 2023-01-22: qty 60

## 2023-01-22 MED ORDER — CHLORHEXIDINE GLUCONATE CLOTH 2 % EX PADS
6.0000 | MEDICATED_PAD | Freq: Every day | CUTANEOUS | Status: DC
  Administered 2023-01-22 – 2023-01-28 (×5): 6 via TOPICAL

## 2023-01-22 MED ORDER — FENTANYL CITRATE PF 50 MCG/ML IJ SOSY: 25.0000 ug | PREFILLED_SYRINGE | Freq: Once | INTRAMUSCULAR | Status: DC | PRN

## 2023-01-22 MED ORDER — PHENYLEPHRINE HCL-NACL 20-0.9 MG/250ML-% IV SOLN
INTRAVENOUS | Status: DC | PRN
  Administered 2023-01-22: 50 ug/min via INTRAVENOUS

## 2023-01-22 MED ORDER — FENTANYL CITRATE PF 50 MCG/ML IJ SOSY
PREFILLED_SYRINGE | INTRAMUSCULAR | Status: AC
  Filled 2023-01-22: qty 1

## 2023-01-22 MED ORDER — HYDROMORPHONE HCL 1 MG/ML IJ SOLN: 0.2500 mg | INTRAMUSCULAR | Status: DC | PRN

## 2023-01-22 MED ORDER — MUPIROCIN 2 % EX OINT
1.0000 | TOPICAL_OINTMENT | Freq: Two times a day (BID) | CUTANEOUS | Status: AC
  Administered 2023-01-22 – 2023-01-26 (×9): 1 via NASAL
  Filled 2023-01-22 (×2): qty 22

## 2023-01-22 MED ORDER — IPRATROPIUM-ALBUTEROL 0.5-2.5 (3) MG/3ML IN SOLN
3.0000 mL | Freq: Once | RESPIRATORY_TRACT | Status: AC
  Administered 2023-01-22: 3 mL via RESPIRATORY_TRACT

## 2023-01-22 MED ORDER — ROPIVACAINE HCL 5 MG/ML IJ SOLN
INTRAMUSCULAR | Status: DC | PRN
  Administered 2023-01-22: 17 mL via PERINEURAL

## 2023-01-22 MED ORDER — DEXAMETHASONE SODIUM PHOSPHATE 10 MG/ML IJ SOLN
INTRAMUSCULAR | Status: AC
  Filled 2023-01-22: qty 1

## 2023-01-22 MED ORDER — DEXAMETHASONE SODIUM PHOSPHATE 4 MG/ML IJ SOLN
INTRAMUSCULAR | Status: DC | PRN
  Administered 2023-01-22: 4 mg via INTRAVENOUS

## 2023-01-22 MED ORDER — CEFAZOLIN SODIUM-DEXTROSE 2-4 GM/100ML-% IV SOLN
2.0000 g | Freq: Three times a day (TID) | INTRAVENOUS | Status: AC
  Administered 2023-01-22 – 2023-01-23 (×3): 2 g via INTRAVENOUS
  Filled 2023-01-22 (×3): qty 100

## 2023-01-22 MED ORDER — ROPIVACAINE HCL 5 MG/ML IJ SOLN
INTRAMUSCULAR | Status: AC
  Filled 2023-01-22: qty 30

## 2023-01-22 MED ORDER — PHENYLEPHRINE HCL (PRESSORS) 10 MG/ML IV SOLN
INTRAVENOUS | Status: DC | PRN
  Administered 2023-01-22 (×2): 320 ug via INTRAVENOUS
  Administered 2023-01-22 (×2): 160 ug via INTRAVENOUS

## 2023-01-22 MED ORDER — DEXAMETHASONE SODIUM PHOSPHATE 4 MG/ML IJ SOLN
INTRAMUSCULAR | Status: AC
  Filled 2023-01-22: qty 1

## 2023-01-22 MED ORDER — LIDOCAINE HCL (PF) 2 % IJ SOLN
INTRAMUSCULAR | Status: AC
  Filled 2023-01-22: qty 5

## 2023-01-22 MED ORDER — PROPOFOL 10 MG/ML IV BOLUS
INTRAVENOUS | Status: AC
  Filled 2023-01-22: qty 20

## 2023-01-22 MED ORDER — SUCCINYLCHOLINE CHLORIDE 200 MG/10ML IV SOSY
PREFILLED_SYRINGE | INTRAVENOUS | Status: AC
  Filled 2023-01-22: qty 10

## 2023-01-22 MED ORDER — LIDOCAINE HCL (PF) 1 % IJ SOLN
INTRAMUSCULAR | Status: DC | PRN
  Administered 2023-01-22: 3 mL

## 2023-01-22 MED ORDER — EPHEDRINE 5 MG/ML INJ
INTRAVENOUS | Status: AC
  Filled 2023-01-22: qty 15

## 2023-01-22 MED ORDER — TRANEXAMIC ACID-NACL 1000-0.7 MG/100ML-% IV SOLN
INTRAVENOUS | Status: AC
  Filled 2023-01-22: qty 100

## 2023-01-22 MED ORDER — PROPOFOL 10 MG/ML IV BOLUS
INTRAVENOUS | Status: DC | PRN
  Administered 2023-01-22: 70 mg via INTRAVENOUS

## 2023-01-22 MED ORDER — DEXAMETHASONE SODIUM PHOSPHATE 4 MG/ML IJ SOLN
INTRAMUSCULAR | Status: DC | PRN
  Administered 2023-01-22: 4 mg via PERINEURAL

## 2023-01-22 MED ORDER — LIDOCAINE HCL (PF) 1 % IJ SOLN
INTRAMUSCULAR | Status: AC
  Filled 2023-01-22: qty 30

## 2023-01-22 MED ORDER — ROCURONIUM BROMIDE 10 MG/ML (PF) SYRINGE
PREFILLED_SYRINGE | INTRAVENOUS | Status: AC
  Filled 2023-01-22: qty 10

## 2023-01-22 MED ORDER — SODIUM CHLORIDE 0.9 % IR SOLN
Status: DC | PRN
  Administered 2023-01-22: 1000 mL

## 2023-01-22 MED ORDER — SUCCINYLCHOLINE CHLORIDE 200 MG/10ML IV SOSY
PREFILLED_SYRINGE | INTRAVENOUS | Status: DC | PRN
  Administered 2023-01-22: 20 mg via INTRAVENOUS
  Administered 2023-01-22: 80 mg via INTRAVENOUS
  Administered 2023-01-22 (×2): 25 mg via INTRAVENOUS

## 2023-01-22 MED ORDER — FENTANYL CITRATE (PF) 250 MCG/5ML IJ SOLN
INTRAMUSCULAR | Status: AC
  Filled 2023-01-22: qty 5

## 2023-01-22 SURGICAL SUPPLY — 55 items
BANDAGE ESMARK 6X9 LF (GAUZE/BANDAGES/DRESSINGS) ×1 IMPLANT
BLADE SURG SZ10 CARB STEEL (BLADE) ×1 IMPLANT
BNDG CMPR 9X6 STRL LF SNTH (GAUZE/BANDAGES/DRESSINGS) ×1
BNDG COHESIVE 4X5 TAN STRL (GAUZE/BANDAGES/DRESSINGS) ×1 IMPLANT
BNDG ELASTIC 4X5.8 VLCR STR LF (GAUZE/BANDAGES/DRESSINGS) IMPLANT
BNDG ELASTIC 6X5.8 VLCR STR LF (GAUZE/BANDAGES/DRESSINGS) IMPLANT
BNDG ESMARK 6X9 LF (GAUZE/BANDAGES/DRESSINGS) ×1
CLOTH BEACON ORANGE TIMEOUT ST (SAFETY) ×1 IMPLANT
COVER LIGHT HANDLE STERIS (MISCELLANEOUS) ×2 IMPLANT
CUFF TOURN SGL QUICK 34 (TOURNIQUET CUFF) ×1
CUFF TRNQT CYL 34X4.125X (TOURNIQUET CUFF) IMPLANT
DRAPE C-ARM FOLDED MOBILE STRL (DRAPES) ×1 IMPLANT
DRAPE C-ARMOR (DRAPES) IMPLANT
DRSG AQUACEL AG ADV 3.5X10 (GAUZE/BANDAGES/DRESSINGS) IMPLANT
ELECT REM PT RETURN 9FT ADLT (ELECTROSURGICAL) ×1
ELECTRODE REM PT RTRN 9FT ADLT (ELECTROSURGICAL) ×1 IMPLANT
FIBERTAPE CERCLAGE TLINK SUT (SUTURE) IMPLANT
GAUZE SPONGE 4X4 12PLY STRL (GAUZE/BANDAGES/DRESSINGS) IMPLANT
GLOVE BIO SURGEON STRL SZ8 (GLOVE) ×3 IMPLANT
GLOVE BIOGEL PI IND STRL 7.0 (GLOVE) ×2 IMPLANT
GLOVE SRG 8 PF TXTR STRL LF DI (GLOVE) ×1 IMPLANT
GLOVE SURG UNDER POLY LF SZ8 (GLOVE) ×1
GOWN STRL REUS W/ TWL XL LVL3 (GOWN DISPOSABLE) ×1 IMPLANT
GOWN STRL REUS W/TWL LRG LVL3 (GOWN DISPOSABLE) ×2 IMPLANT
GOWN STRL REUS W/TWL XL LVL3 (GOWN DISPOSABLE) ×1
GUIDEWIRE 1.6 (WIRE) ×2
GUIDEWIRE ORTH 157X1.6XTROC (WIRE) IMPLANT
INST SET MINOR BONE (KITS) ×1 IMPLANT
K-WIRE 1.6 (Wire) IMPLANT
K-WIRE BB-TAK (WIRE) ×2
KIT TURNOVER KIT A (KITS) ×1 IMPLANT
KWIRE BB-TAK (WIRE) IMPLANT
MANIFOLD NEPTUNE II (INSTRUMENTS) ×1 IMPLANT
NDL HYPO 21X1.5 SAFETY (NEEDLE) ×1 IMPLANT
NEEDLE HYPO 21X1.5 SAFETY (NEEDLE) ×1 IMPLANT
NS IRRIG 1000ML POUR BTL (IV SOLUTION) ×1 IMPLANT
PACK BASIC LIMB (CUSTOM PROCEDURE TRAY) ×1 IMPLANT
PAD ARMBOARD 7.5X6 YLW CONV (MISCELLANEOUS) ×1 IMPLANT
PADDING CAST COTTON 6X4 STRL (CAST SUPPLIES) IMPLANT
PLATE SUT PATELLA LG (Plate) IMPLANT
SCREW KREULOCK 3.0X10 (Screw) IMPLANT
SCREW LOCK COMP 3X16 (Screw) IMPLANT
SCREW VAL KREULOCK 3.0X18 TI (Screw) IMPLANT
SCREW VAL KREULOCK 3X20 (Screw) IMPLANT
SET BASIN LINEN APH (SET/KITS/TRAYS/PACK) ×1 IMPLANT
SPONGE T-LAP 18X18 ~~LOC~~+RFID (SPONGE) ×1 IMPLANT
STAPLER VISISTAT (STAPLE) ×1 IMPLANT
SUT MNCRL AB 4-0 PS2 18 (SUTURE) IMPLANT
SUT MON AB 2-0 CT1 36 (SUTURE) IMPLANT
SUT VIC AB 1 CT1 27 (SUTURE) ×1
SUT VIC AB 1 CT1 27XBRD ANTBC (SUTURE) ×1 IMPLANT
SYR 30ML LL (SYRINGE) IMPLANT
SYR BULB IRRIG 60ML STRL (SYRINGE) ×1 IMPLANT
TENSIONER FIBERTAPE CERCLAGE (DISPOSABLE) IMPLANT
TOWEL OR 17X26 4PK STRL BLUE (TOWEL DISPOSABLE) ×1 IMPLANT

## 2023-01-22 NOTE — Op Note (Signed)
Orthopaedic Surgery Operative Note (CSN: [id number])  Michael Evans  1941/08/14 Date of Surgery: 01/22/2023   Diagnoses:  Comminuted Left patella fracture  Procedure: Operative fixation of comminuted left patella fracture   Operative Finding Successful completion of the planned procedure.  Fracture was secured with a star-shaped plate, with [employer] distally for the distal pole of the patella, as well as tension band construct using K wires and a cerclage tape.   Post-Op Diagnosis: Same Surgeons:Primary: Mordecai Rasmussen, MD Assistants: Franklyn Lor Location: [facility] OR ROOM 4 Anesthesia: General with regional anesthesia Antibiotics: Ancef 2 g Tourniquet time:  Total Tourniquet Time Documented: Thigh (Left) - 91 minutes Total: Thigh (Left) - 91 minutes  Estimated Blood Loss: [id number] cc Complications: None Specimens: None Implants: Implant Name Type Inv. Item Serial No. Manufacturer Lot No. LRB No. Used Action  PLATE SUT PATELLA LG - [id number] Plate PLATE SUT PATELLA LG  [employer] [id number] Left 1 Implanted  SCREW VAL KREULOCK 3X20 - [id number] Screw SCREW VAL KREULOCK 3X20  [employer] STERILE ON SET Left 2 Implanted  SCREW VAL KREULOCK 3.0X18 TI - [id number] Screw SCREW VAL KREULOCK 3.0X18 TI  [employer] STERILE ON SET Left 2 Implanted  SCREW LOCK COMP 3X16 - [id number] Screw SCREW LOCK COMP 3X16  [employer] STERILE ON SET Left 2 Implanted  SCREW KREULOCK 3.0X10 - [id number] Screw SCREW KREULOCK 3.0X10  [employer] STERILE ON SET Left 1 Implanted  K-WIRE 1.6 Wire   [employer] STERILE ON SET Left 2 Implanted    Indications for Surgery:   Michael Evans is a 82 y.o. male who fell off a ladder, couple of days ago.  He landed directly onto his left knee.  He sustained a comminuted left patella fracture.  He lives at home, with his sister, with limited assistance otherwise.  As such, he was admitted to the hospital.  I discussed proceeding with surgery to repair his patella fracture.   Benefits and risks of operative and nonoperative management were discussed prior to surgery with the patient and informed consent form was completed.  Specific risks including infection, need for additional surgery, bleeding, stiffness, persistent pain, exacerbation of existing arthritis, nonunion, malunion and more severe complications associated with anesthesia.  All questions were answered.  He elected to proceed.   Procedure:   The patient was identified properly. Informed consent was obtained and the surgical site was marked. The patient was taken to the OR where general anesthesia was induced.  The patient was positioned supine, with his left leg on bone foam.  The left leg was prepped and draped in the usual sterile fashion.  Timeout was performed before the beginning of the case.  Tourniquet was used for the above duration.  He received appropriate antibiotics, as well as TXA prior to making incision.  We made a longitudinal incision centered over the patella.  We dissected sharply through skin.  We then dissected bluntly through subcutaneous tissue.  There was a large hematoma formed already.  There were large chunks of hematoma as well.  These were removed with a combination of suction, irrigation and rongeurs.  Both bony ends were cleared of hematoma.  The distal pole was smaller.  There were multiple fragments.  In addition, there appeared to be some impaction, and bone loss.  We attempted to reduce the fracture under direct visualization using a fracture reduction clamp.  However, this resulted in over reduction of the fracture.  Subsequently, we selected a star-shaped plate, with  prongs distally that we use to appropriately reduce the fracture.  This was provisionally placed.  The prongs were placed through the patella tendon into the distal pole of the patella.  Fluoroscopic confirmed an excellent reduction.  We then proceeded to place multiple screws in the proximal fragment.  We then placed  multiple screws in the distal fragment through the plate.  This did secure the plate, and maintain our reduction.  However, there was still some distraction to the fracture site, and loss of reduction, especially laterally.  We used a clamp in the lateral portion of the fracture, to maintain reduction.  We then placed 2 K wires that were approximately parallel through the fragments, and use the cerclage tape in a figure-of-eight fashion to help secure the remaining fragments.  The cerclage tape was tensioned appropriately.  Final reduction was evaluated under fluoroscopy.  We then flexed the knee up to approximately 60 degrees and did not appreciate gapping of the fracture.  We then irrigated the wound very well.  The medial lateral retinacular tears were sutured with #1 Vicryl.  The wound was then closed in a layered fashion, with staples on the skin.   Sterile dressing was placed followed by a knee immobilizer.  Patient was awoken taken to PACU in stable condition.  He will be admitted to the ICU for overnight observation due to existing medical comorbidities.   Post-operative plan:  The patient will be NWB on the operative extremity Discharge home from the PACU once they have recovered DVT prophylaxis per primary team, no orthopedic contraindications.   Please hold DVT prophylaxis until postoperative day #1. Pain control with PRN pain medication preferring oral medicines.   Follow up plan will be scheduled in approximately 10-14 days for incision check and XR.

## 2023-01-22 NOTE — Anesthesia Preprocedure Evaluation (Addendum)
Anesthesia Evaluation  Patient identified by MRN, date of birth, ID band Patient awake    Reviewed: Allergy & Precautions, NPO status , Patient's Chart, lab work & pertinent test results  History of Anesthesia Complications (+) history of anesthetic complications (patient usually hs to be intubated with anesthesia)  Airway Mallampati: II  TM Distance: >3 FB Neck ROM: Full    Dental  (+) Dental Advisory Given, Edentulous Upper, Edentulous Lower   Pulmonary shortness of breath, with exertion and Long-Term Oxygen Therapy, sleep apnea , COPD,  COPD inhaler and oxygen dependent, former smoker   breath sounds clear to auscultation + decreased breath sounds      Cardiovascular Exercise Tolerance: Poor hypertension, Pt. on medications Normal cardiovascular exam Rhythm:Regular Rate:Normal     Neuro/Psych    GI/Hepatic ,GERD  Medicated,,  Endo/Other    Renal/GU      Musculoskeletal  (+) Arthritis , Osteoarthritis,    Abdominal   Peds  Hematology   Anesthesia Other Findings   Reproductive/Obstetrics                             Anesthesia Physical Anesthesia Plan  ASA: 4  Anesthesia Plan: General   Post-op Pain Management: Regional block* and Dilaudid IV   Induction: Intravenous  PONV Risk Score and Plan: 3 and Ondansetron and Dexamethasone  Airway Management Planned: Oral ETT  Additional Equipment:   Intra-op Plan:   Post-operative Plan: Possible Post-op intubation/ventilation and Extubation in OR  Informed Consent: I have reviewed the patients History and Physical, chart, labs and discussed the procedure including the risks, benefits and alternatives for the proposed anesthesia with the patient or authorized representative who has indicated his/her understanding and acceptance.       Plan Discussed with: CRNA and Surgeon  Anesthesia Plan Comments:         Anesthesia Quick  Evaluation

## 2023-01-22 NOTE — Anesthesia Procedure Notes (Signed)
Procedure Name: Intubation Date/Time: 01/22/2023 2:17 PM  Performed by: Purcell Nails, RNPre-anesthesia Checklist: Patient identified, Emergency Drugs available, Suction available and Patient being monitored Patient Re-evaluated:Patient Re-evaluated prior to induction Oxygen Delivery Method: Circle system utilized Preoxygenation: Pre-oxygenation with 100% oxygen Induction Type: IV induction Laryngoscope Size: Miller and 3 Grade View: Grade I Tube type: Oral Tube size: 8.0 mm Number of attempts: 1 Airway Equipment and Method: Stylet and Oral airway Placement Confirmation: ETT inserted through vocal cords under direct vision, positive ETCO2 and breath sounds checked- equal and bilateral Secured at: 21 cm Tube secured with: Tape Dental Injury: Teeth and Oropharynx as per pre-operative assessment

## 2023-01-22 NOTE — Progress Notes (Signed)
PROGRESS NOTE  Michael Evans [id number] DOB: [birth year]   PCP: [name], MD  Patient is from: Home.  Lives with his wife.  Independently ambulates at baseline.  DOA: 01/20/2023 LOS: 2  Chief complaints Chief Complaint  Patient presents with   Fall     Brief Narrative / Interim history: 82/M with history of COPD, chronic respiratory failure on 2 L home O2, hypertension, BPH, presented to the ED after a fall from 7 feet off a ladder, reportedly lost his balance and fell, did not hit his head, LOC no loss of consciousness, developed severe left knee pain, x-rays noted comminuted distracted patella fracture and blood pressure of 171/81.  Orthopedic surgery consulted.  Plan for surgical repair.  Subjective: Seen and examined earlier this morning before he went down for surgery.  No major events overnight of this morning.  Reports significant pain in his left knee.  Denies numbness or tingling.  Denies chest pain or dyspnea.  Objective: Vitals:   01/21/23 1248 01/21/23 2054 01/22/23 0618 01/22/23 0757  BP: 127/66 120/60 135/63   Pulse: 72 74 92 95  Resp: 18 16 19 18  $ Temp: 98.5 F (36.9 C) 97.9 F (36.6 C) 98.1 F (36.7 C)   TempSrc:  Oral    SpO2: 96% 96% 96% 95%  Weight:      Height:        Examination:  GENERAL: No apparent distress.  Nontoxic. HEENT: MMM.  Vision and hearing grossly intact.  NECK: Supple.  No apparent JVD.  RESP:  No IWOB.  Fair aeration bilaterally. CVS:  RRR. Heart sounds normal.  ABD/GI/GU: BS+. Abd soft, NTND.  MSK/EXT:  Moves extremities.  Left leg in knee immobilizer.  Neurovascular intact. SKIN: no apparent skin lesion or wound NEURO: Awake, alert and oriented appropriately.  No apparent focal neuro deficit. PSYCH: Calm. Normal affect.   Procedures:  None  Microbiology summarized: MRSA PCR screen negative.  Assessment and plan: Principal Problem:   Comminuted fracture of left patella Active Problems:   Essential  hypertension   COPD with emphysema ([facility])   Chronic respiratory failure with hypoxia ([facility])  Accidental fall off the ladder, 7 ft high-no head trauma or LOC.  No prodromes. Comminuted fracture of left patella due to fall. -In knee immobilizers pending surgical repair. -Pain control and VTE prophylaxis per surgery. -Bowel regimen   COPD with emphysema/chronic hypoxic RF on 2 L.  Stable. -Continue home regiment   BPH: Stable. -Resume Flomax and Proscar  Body mass index is 20.97 kg/m.          DVT prophylaxis:  SCDs Start: 01/20/23 2228  Code Status: Full code Family Communication: None at bedside Level of care: Med-Surg Status is: Inpatient Remains inpatient appropriate because: Left knee fracture   Final disposition: TBD Consultants:  Orthopedic surgery  35 minutes with more than 50% spent in reviewing records, counseling patient/family and coordinating care.   Sch Meds:  Scheduled Meds:  [MAR Hold] finasteride  5 mg Oral Daily   influenza vaccine adjuvanted  0.5 mL Intramuscular Tomorrow-1000   [MAR Hold] mometasone-formoterol  2 puff Inhalation BID   [MAR Hold] mupirocin ointment  1 Application Nasal BID   [MAR Hold] pneumococcal 20-valent conjugate vaccine  0.5 mL Intramuscular Tomorrow-1000   [MAR Hold] polyethylene glycol  17 g Oral BID   [MAR Hold] tamsulosin  0.8 mg Oral Daily   [MAR Hold] umeclidinium bromide  1 puff Inhalation Daily   Continuous Infusions:  [MAR Hold]  ceFAZolin (ANCEF) IV     [MAR Hold] tranexamic acid     PRN Meds:.[MAR Hold] ipratropium-albuterol, [MAR Hold] labetalol, [MAR Hold]  morphine injection, [MAR Hold] ondansetron **OR** [MAR Hold] ondansetron (ZOFRAN) IV  Antimicrobials: Anti-infectives (From admission, onward)    Start     Dose/Rate Route Frequency Ordered Stop   01/22/23 1200  [MAR Hold]  ceFAZolin (ANCEF) IVPB 2g/100 mL premix        (MAR Hold since Thu 01/22/2023 at 1257.Hold Reason: Transfer to a Procedural area)    2 g 200 mL/hr over 30 Minutes Intravenous On call to O.R. 01/21/23 1540 01/23/23 0559        I have personally reviewed the following labs and images: CBC: Recent Labs  Lab 01/20/23 1825 01/21/23 0500 01/22/23 0512  WBC 7.9 10.3 10.9*  NEUTROABS 6.0  --   --   HGB 15.0 12.3* 11.6*  HCT 45.6 38.3* 35.9*  MCV 89.2 90.8 90.2  PLT 246 230 203   BMP &GFR Recent Labs  Lab 01/20/23 1825 01/21/23 0500 01/22/23 0512  NA 140 140 136  K 4.2 3.6 3.7  CL 103 106 103  CO2 28 29 27  $ GLUCOSE 118* 102* 135*  BUN 31* 26* 21  CREATININE 0.96 0.97 0.89  CALCIUM 10.4* 9.3 8.7*   Estimated Creatinine Clearance: 50 mL/min (by C-G formula based on SCr of 0.89 mg/dL). Liver & Pancreas: Recent Labs  Lab 01/20/23 1825  AST 28  ALT 20  ALKPHOS 62  BILITOT 0.4  PROT 7.0  ALBUMIN 3.9   No results for input(s): "LIPASE", "AMYLASE" in the last 168 hours. No results for input(s): "AMMONIA" in the last 168 hours. Diabetic: No results for input(s): "HGBA1C" in the last 72 hours. No results for input(s): "GLUCAP" in the last 168 hours. Cardiac Enzymes: No results for input(s): "CKTOTAL", "CKMB", "CKMBINDEX", "TROPONINI" in the last 168 hours. No results for input(s): "PROBNP" in the last 8760 hours. Coagulation Profile: Recent Labs  Lab 01/20/23 1825  INR 0.9   Thyroid Function Tests: No results for input(s): "TSH", "T4TOTAL", "FREET4", "T3FREE", "THYROIDAB" in the last 72 hours. Lipid Profile: No results for input(s): "CHOL", "HDL", "LDLCALC", "TRIG", "CHOLHDL", "LDLDIRECT" in the last 72 hours. Anemia Panel: No results for input(s): "VITAMINB12", "FOLATE", "FERRITIN", "TIBC", "IRON", "RETICCTPCT" in the last 72 hours. Urine analysis:    Component Value Date/Time   COLORURINE YELLOW (A) 01/20/2023 1925   APPEARANCEUR CLEAR 01/20/2023 1925   LABSPEC 1.010 01/20/2023 1925   PHURINE 6.0 01/20/2023 1925   GLUCOSEU NEGATIVE 01/20/2023 1925   HGBUR NEGATIVE 01/20/2023 1925    BILIRUBINUR NEGATIVE 01/20/2023 1925   KETONESUR NEGATIVE 01/20/2023 1925   PROTEINUR NEGATIVE 01/20/2023 1925   NITRITE NEGATIVE 01/20/2023 1925   LEUKOCYTESUR NEGATIVE 01/20/2023 1925   Sepsis Labs: Invalid input(s): "PROCALCITONIN", "LACTICIDVEN"  Microbiology: Recent Results (from the past 240 hour(s))  Surgical PCR screen     Status: None   Collection Time: 01/22/23  1:02 AM   Specimen: Nasal Mucosa; Nasal Swab  Result Value Ref Range Status   MRSA, PCR NEGATIVE NEGATIVE Final   Staphylococcus aureus NEGATIVE NEGATIVE Final    Comment: (NOTE) The Xpert SA Assay (FDA approved for NASAL specimens in patients 68 years of age and older), is one component of a comprehensive surveillance program. It is not intended to diagnose infection nor to guide or monitor treatment. Performed at [facility], [address], [place], [place] [address]     Radiology Studies: No results found.  Caran Storck T. [place]  If 7PM-7AM, please contact night-coverage [email or web address] 01/22/2023, 1:10 PM

## 2023-01-22 NOTE — Anesthesia Procedure Notes (Signed)
Anesthesia Regional Block: Femoral nerve block   Pre-Anesthetic Checklist: , timeout performed,  Correct Patient, Correct Site, Correct Laterality,  Correct Procedure, Correct Position, site marked,  Risks and benefits discussed,  Surgical consent,  Pre-op evaluation,  At surgeon's request and post-op pain management  Laterality: Left and Lower  Prep: chloraprep       Needles:  Injection technique: Single-shot  Needle Type: Echogenic Stimulator Needle     Needle Length: 10cm  Needle Gauge: 20   Needle insertion depth: 5 cm   Additional Needles:   Procedures:, nerve stimulator,,, ultrasound used (permanent image in chart),,     Nerve Stimulator or Paresthesia:  Response: Twitch elicited, 0.5 mA, 0.3 ms, 5 cm  Additional Responses:   Narrative:  Start time: 01/22/2023 1:48 PM End time: 01/22/2023 1:55 PM Injection made incrementally with aspirations every 5 mL.  Performed by: Personally  Anesthesiologist: Denese Killings, MD  Additional Notes: BP cuff, EKG monitors applied. Sedation begun. After nerve location anesthetic injected incrementally, slowly , and after neg aspirations. Tolerated well.

## 2023-01-22 NOTE — Progress Notes (Signed)
Patient transferred from PACU to unit. Patient alert and oriented x4. No current complaints of pain, shortness of breath, chest pain, dizziness, nausea or vomiting. Patient able to move all extremities. Able to wiggle left foot toes and states he can feel writer's touch to both feet. Pulses felt. Patient ate some dinner tray, drinking adequately. Dr Cyndia Skeeters made aware of patient being transferred to unit and updated on vital signs and patient status. Patient has a wet, productive cough with thick, clear sputum. Dr Cyndia Skeeters made aware. Chest xray ordered. PO Flomax and Proscar rescheduled to be given a little later (at 2200) due to blood pressure being 97/68 (map 76). Dr Cyndia Skeeters made aware. Will continue to monitor. Patient urinated in urinal 365m. No current complaints.

## 2023-01-22 NOTE — Transfer of Care (Signed)
Immediate Anesthesia Transfer of Care Note  Patient: Michael Evans  Procedure(s) Performed: OPEN REDUCTION INTERNAL FIXATION (ORIF) PATELLA (Left: Knee)  Patient Location: PACU  Anesthesia Type:General  Level of Consciousness: awake and patient cooperative  Airway & Oxygen Therapy: Patient Spontanous Breathing and Patient connected to nasal cannula oxygen  Post-op Assessment: Report given to RN and Post -op Vital signs reviewed and stable  Post vital signs: Reviewed and stable  Last Vitals:  Vitals Value Taken Time  BP 102/52 01/22/23 1635  Temp 98.3   Pulse 86 01/22/23 1638  Resp 21 01/22/23 1638  SpO2 94 % 01/22/23 1638  Vitals shown include unvalidated device data.  Last Pain:  Vitals:   01/22/23 1320  TempSrc: Oral  PainSc: 9       Patients Stated Pain Goal: 0 ([id number])  Complications: No notable events documented.

## 2023-01-22 NOTE — Progress Notes (Signed)
   ORTHOPAEDIC PROGRESS NOTE  Scheduled for ORIF of left patella fracture  DOS: 01/22/2023  SUBJECTIVE: No issues overnight.  Nothing to eat or drink since midnight.  Pain is controlled.  No numbness or tingling.  OBJECTIVE: PE:  Elderly male.  No acute distress.  Thin male.  Left knee in a knee immobilizer. Effusion over the left knee. Toes are warm and well-perfused. Active motion intact to his toes and ankle. Sensation intact over the dorsum of his foot.  Vitals:   01/22/23 0618 01/22/23 0757  BP: 135/63   Pulse: 92 95  Resp: 19 18  Temp: 98.1 F (36.7 C)   SpO2: 96% 95%       Latest Ref Rng & Units 01/22/2023    5:12 AM 01/21/2023    5:00 AM 01/20/2023    6:25 PM  CBC  WBC 4.0 - 10.5 K/uL 10.9  10.3  7.9   Hemoglobin 13.0 - 17.0 g/dL 11.6  12.3  15.0   Hematocrit 39.0 - 52.0 % 35.9  38.3  45.6   Platelets 150 - 400 K/uL 203  230  246       ASSESSMENT: Michael Evans is a 82 y.o. male doing well.  Ready for OR.  NPO since midnight.   PLAN: Weightbearing: NWB LLE Insicional and dressing care: Reinforce dressings as needed; none currently Orthopedic device(s): None VTE prophylaxis: Recommend Aspirin 47m BID; to begin POD#1 Pain control: PO pain medications as needed; judicious use of narcotics Follow - up plan: 2 weeks   Contact information:     Janeese Mcgloin A. CAmedeo Kinsman MD MEast FalmouthRLansdowne[address] Mill [address] StreetRBensley  [place]  [address]Phone: [phone]: [phone]

## 2023-01-23 LAB — IRON AND TIBC
Iron: 19 ug/dL — ABNORMAL LOW (ref 45–182)
Saturation Ratios: 6 % — ABNORMAL LOW (ref 17.9–39.5)
TIBC: 320 ug/dL (ref 250–450)
UIBC: 301 ug/dL

## 2023-01-23 LAB — RENAL FUNCTION PANEL
Albumin: 2.7 g/dL — ABNORMAL LOW (ref 3.5–5.0)
Anion gap: 9 (ref 5–15)
BUN: 17 mg/dL (ref 8–23)
CO2: 24 mmol/L (ref 22–32)
Calcium: 8.6 mg/dL — ABNORMAL LOW (ref 8.9–10.3)
Chloride: 101 mmol/L (ref 98–111)
Creatinine, Ser: 0.84 mg/dL (ref 0.61–1.24)
GFR, Estimated: >60 mL/min (ref >60)
Glucose, Bld: 149 mg/dL — ABNORMAL HIGH (ref 70–99)
Phosphorus: 2.4 mg/dL — ABNORMAL LOW (ref 2.5–4.6)
Potassium: 3.9 mmol/L (ref 3.5–5.1)
Sodium: 134 mmol/L — ABNORMAL LOW (ref 135–145)

## 2023-01-23 LAB — CBC
HCT: 34 % — ABNORMAL LOW (ref 39.0–52.0)
Hemoglobin: 11.3 g/dL — ABNORMAL LOW (ref 13.0–17.0)
MCH: 30.1 pg (ref 26.0–34.0)
MCHC: 33.2 g/dL (ref 30.0–36.0)
MCV: 90.7 fL (ref 80.0–100.0)
Platelets: 166 10*3/uL (ref 150–400)
RBC: 3.75 MIL/uL — ABNORMAL LOW (ref 4.22–5.81)
RDW: 13 % (ref 11.5–15.5)
WBC: 13 10*3/uL — ABNORMAL HIGH (ref 4.0–10.5)
nRBC: 0 % (ref 0.0–0.2)

## 2023-01-23 LAB — SARS CORONAVIRUS 2 BY RT PCR: SARS Coronavirus 2 by RT PCR: POSITIVE — AB

## 2023-01-23 LAB — MAGNESIUM: Magnesium: 1.9 mg/dL (ref 1.7–2.4)

## 2023-01-23 LAB — RETICULOCYTES
Immature Retic Fract: 12.7 % (ref 2.3–15.9)
RBC.: 3.7 MIL/uL — ABNORMAL LOW (ref 4.22–5.81)
Retic Count, Absolute: 46.6 10*3/uL (ref 19.0–186.0)
Retic Ct Pct: 1.3 % (ref 0.4–3.1)

## 2023-01-23 LAB — VITAMIN B12: Vitamin B-12: 837 pg/mL (ref 180–914)

## 2023-01-23 LAB — FERRITIN: Ferritin: 72 ng/mL (ref 24–336)

## 2023-01-23 LAB — FOLATE: Folate: 15.5 ng/mL (ref >5.9)

## 2023-01-23 MED ORDER — ASPIRIN 81 MG PO TBEC: 81.0000 mg | DELAYED_RELEASE_TABLET | Freq: Two times a day (BID) | ORAL | 0 refills | Status: DC

## 2023-01-23 MED ORDER — OXYCODONE HCL 5 MG PO TABS: 5.0000 mg | ORAL_TABLET | ORAL | 0 refills | Status: DC | PRN

## 2023-01-23 MED ORDER — IBUPROFEN 400 MG PO TABS
400.0000 mg | ORAL_TABLET | Freq: Once | ORAL | Status: AC
  Administered 2023-01-23: 400 mg via ORAL
  Filled 2023-01-23: qty 1

## 2023-01-23 MED ORDER — ASPIRIN 81 MG PO TBEC
81.0000 mg | DELAYED_RELEASE_TABLET | Freq: Two times a day (BID) | ORAL | Status: DC
  Administered 2023-01-23 – 2023-01-29 (×13): 81 mg via ORAL
  Filled 2023-01-23 (×13): qty 1

## 2023-01-23 NOTE — Plan of Care (Signed)
  Problem: Acute Rehab OT Goals (only OT should resolve) Goal: Pt. Will Perform Lower Body Bathing Flowsheets (Taken 01/23/2023 1257) Pt Will Perform Lower Body Bathing:  sitting/lateral leans  with min guard assist  with min assist Goal: Pt. Will Perform Lower Body Dressing Flowsheets (Taken 01/23/2023 1257) Pt Will Perform Lower Body Dressing:  with min guard assist  with min assist  sitting/lateral leans Goal: Pt. Will Transfer To Toilet Flowsheets (Taken 01/23/2023 1257) Pt Will Transfer to Toilet:  with supervision  ambulating Goal: Pt. Will Perform Toileting-Clothing Manipulation Flowsheets (Taken 01/23/2023 1257) Pt Will Perform Toileting - Clothing Manipulation and hygiene:  with modified independence  sitting/lateral leans Goal: Pt/Caregiver Will Perform Home Exercise Program Flowsheets (Taken 01/23/2023 1257) Pt/caregiver will Perform Home Exercise Program:  Increased strength  Both right and left upper extremity  Independently  Alianis Trimmer OT, MOT

## 2023-01-23 NOTE — Discharge Summary (Signed)
Physician Discharge Summary  Michael Evans [id number] DOB: 03/04/1941 DOA: 01/20/2023  PCP: [name], MD  Admit date: 01/20/2023  Discharge date: 01/23/2023  Admitted From:Home  Disposition:  SNF  Recommendations for Outpatient Follow-up:  Follow up with PCP in 1-2 weeks Follow-up with orthopedics Dr. Amedeo Kinsman in 2 weeks as recommended Continue on aspirin 81 mg twice daily for DVT prophylaxis as prescribed Oxycodone printed prescription for 10 tablets and 0 refills as needed for pain management  Home Health: None  Equipment/Devices: Left knee immobilizer  Discharge Condition:Stable  CODE STATUS: Full  Diet recommendation: Heart Healthy  Brief/Interim Summary:  82/M with history of COPD, chronic respiratory failure on 2 L home O2, hypertension, BPH, presented to the ED after a fall from 7 feet off a ladder, reportedly lost his balance and fell, did not hit his head, LOC no loss of consciousness, developed severe left knee pain, x-rays noted comminuted distracted patella fracture and blood pressure of 171/81.  Orthopedic surgery consulted and he underwent ORIF of left patellar fracture 2/22.  He now remains in a knee immobilizer and has been seen by PT/OT with recommendations for SNF placement.  No other acute events noted during the course of this admission and he is stable for discharge.  Discharge Diagnoses:  Principal Problem:   Comminuted fracture of left patella Active Problems:   Essential hypertension   COPD with emphysema (HCC)   Chronic respiratory failure with hypoxia (HCC)  Principal discharge diagnosis: Comminuted fracture of left patella due to accidental fall off of a ladder status post ORIF 2/22.  Discharge Instructions  Discharge Instructions     Ambulatory referral to Orthopedics   Complete by: As directed    Diet - low sodium heart healthy   Complete by: As directed    Diet - low sodium heart healthy   Complete by: As directed    Increase  activity slowly   Complete by: As directed    Increase activity slowly   Complete by: As directed       Allergies as of 01/23/2023       Reactions   Benzyl Alcohol Itching   Amlodipine Swelling   Swelling in feet   Aspirin Other (See Comments)   Upset stomach.   Other Hypertension   Hazelnuts   Penicillins Hives   Has patient had a PCN reaction causing immediate rash, facial/tongue/throat swelling, SOB or lightheadedness with hypotension: Yes Has patient had a PCN reaction causing severe rash involving mucus membranes or skin necrosis: No Has patient had a PCN reaction that required hospitalization No Has patient had a PCN reaction occurring within the last 10 years: No If all of the above answers are "NO", then may proceed with Cephalosporin use.   Sulfa Antibiotics Hives   Tylenol [acetaminophen] Other (See Comments)   Pt reports he has had liver issues and was told not to take tylenol         Medication List     TAKE these medications    albuterol 108 (90 Base) MCG/ACT inhaler Commonly known as: VENTOLIN HFA INHALE 1-2 PUFFS BY MOUTH EVERY 6 HOURS AS NEEDED FOR WHEEZE OR SHORTNESS OF BREATH What changed: See the new instructions.   aspirin EC 81 MG tablet Take 1 tablet (81 mg total) by mouth 2 (two) times daily for 14 days. Swallow whole.   Breztri Aerosphere 160-9-4.8 MCG/ACT Aero Generic drug: Budeson-Glycopyrrol-Formoterol Inhale 2 puffs into the lungs in the morning and at bedtime.   cholecalciferol  25 MCG (1000 UT) tablet Generic drug: Cholecalciferol Take 3,000 Units by mouth daily.   cyanocobalamin 2000 MCG tablet Take 1,000 mcg by mouth daily.   finasteride 5 MG tablet Commonly known as: Proscar Take 1 tablet (5 mg total) by mouth daily.   FISH OIL CONCENTRATE PO Take 1,000 mg by mouth daily.   multivitamin tablet Take 1 tablet by mouth daily. Men's health   Nebulizer Misc 1 each by Does not apply route daily as needed. PLEASE PROVIDE  NEBULIZER KIT TO INCLUDE TUBING/MASK/MOUTHPIECE   ondansetron 4 MG disintegrating tablet Commonly known as: ZOFRAN-ODT Take 1 tablet (4 mg total) by mouth every 6 (six) hours as needed for nausea.   oxyCODONE 5 MG immediate release tablet Commonly known as: Roxicodone Take 1 tablet (5 mg total) by mouth every 4 (four) hours as needed for severe pain or breakthrough pain.   pyridoxine 100 MG tablet Commonly known as: B-6 Take 100 mg by mouth daily.   tamsulosin 0.4 MG Caps capsule Commonly known as: FLOMAX Take 2 capsules (0.8 mg total) by mouth daily.   vitamin C 1000 MG tablet Take 1,000 mg by mouth daily.        Follow-up Information     [name], MD. Schedule an appointment as soon as possible for a visit in 1 week(s).   Specialty: Family Medicine Contact information: [address] [place] [address] [phone]         Mordecai Rasmussen, MD Follow up in 2 week(s).   Specialties: Orthopedic Surgery, Sports Medicine Contact information: [place]. [address] [place] [address] [phone]                Allergies  Allergen Reactions   Benzyl Alcohol Itching   Amlodipine Swelling    Swelling in feet   Aspirin Other (See Comments)    Upset stomach.   Other Hypertension    Hazelnuts   Penicillins Hives    Has patient had a PCN reaction causing immediate rash, facial/tongue/throat swelling, SOB or lightheadedness with hypotension: Yes Has patient had a PCN reaction causing severe rash involving mucus membranes or skin necrosis: No Has patient had a PCN reaction that required hospitalization No Has patient had a PCN reaction occurring within the last 10 years: No If all of the above answers are "NO", then may proceed with Cephalosporin use.    Sulfa Antibiotics Hives   Tylenol [Acetaminophen] Other (See Comments)    Pt reports he has had liver issues and was told not to take tylenol      Consultations: Orthopedics   Procedures/Studies: DG Knee 1-2 Views Left  Result Date: 01/22/2023 CLINICAL DATA:  Postop after patellar fracture repair EXAM: LEFT KNEE - 1-2 VIEW COMPARISON:  Radiographs 01/20/2023 FINDINGS: Expected postoperative change after ORIF of the left patella fracture. Cutaneous staples. Mild adjacent edema. No radiographic evidence of hardware loosening. IMPRESSION: Expected postoperative change after ORIF of the left patella fracture. Electronically Signed   By: Placido Sou M.D.   On: 01/22/2023 17:39   DG Knee 1-2 Views Left  Result Date: 01/22/2023 CLINICAL DATA:  Fluoro guidance provided EXAM: LEFT KNEE - 1-2 VIEW FINDINGS: Dose: 2.9 mGy Fluoro time 41 s IMPRESSION: C-arm fluoro guidance provided. Electronically Signed   By: Sammie Bench M.D.   On: 01/22/2023 16:44   DG C-Arm 1-60 Min-No Report  Result Date: 01/22/2023 Fluoroscopy was utilized by the requesting physician.  No radiographic interpretation.   DG Knee  2 Views Left  Result Date: 01/20/2023 CLINICAL DATA:  Patellar fracture. EXAM: LEFT KNEE - 1-2 VIEW COMPARISON:  None Available. FINDINGS: Status post knee immobilization. There is a comminuted distracted patellar fracture now approximately 3.1 cm distraction between the fracture fragments. Marked soft tissue swelling and edema about the anterior aspect of the patella. IMPRESSION: Comminuted distracted patellar fracture status post immobilization. Electronically Signed   By: Keane Police D.O.   On: 01/20/2023 23:08   DG Chest 1 View  Result Date: 01/20/2023 CLINICAL DATA:  Fall, pt on O2, left knee pain EXAM: CHEST  1 VIEW COMPARISON:  07/28/2022 FINDINGS: Lungs are clear. Heart size and mediastinal contours are within normal limits. Aortic Atherosclerosis (ICD10-170.0). No effusion. Visualized bones unremarkable. IMPRESSION: No acute cardiopulmonary disease. Electronically Signed   By: Lucrezia Europe M.D.   On: 01/20/2023 18:21   DG Knee  Complete 4 Views Left  Result Date: 01/20/2023 CLINICAL DATA:  Pain post fall EXAM: LEFT KNEE - COMPLETE 4+ VIEW FINDINGS: Comminuted patellar fracture, dominant fracture fragments distracted approximately 6 cm. The distal femur and proximal tibia appear intact. IMPRESSION: Comminuted, distracted patellar fracture. Electronically Signed   By: Lucrezia Europe M.D.   On: 01/20/2023 18:21     Discharge Exam: Vitals:   01/23/23 1100 01/23/23 1200  BP: (!) 95/38 (!) 87/37  Pulse: 72 85  Resp: 20 16  Temp:    SpO2: 93% 100%   Vitals:   01/23/23 0900 01/23/23 1000 01/23/23 1100 01/23/23 1200  BP: (!) 109/50 (!) 84/40 (!) 95/38 (!) 87/37  Pulse: 86  72 85  Resp: (!) '22 18 20 16  '$ Temp:      TempSrc:      SpO2: 97%  93% 100%  Weight:      Height:        General: Pt is alert, awake, not in acute distress Cardiovascular: RRR, S1/S2 +, no rubs, no gallops Respiratory: CTA bilaterally, no wheezing, no rhonchi Abdominal: Soft, NT, ND, bowel sounds + Extremities: no edema, no cyanosis    The results of significant diagnostics from this hospitalization (including imaging, microbiology, ancillary and laboratory) are listed below for reference.     Microbiology: Recent Results (from the past 240 hour(s))  Surgical PCR screen     Status: None   Collection Time: 01/22/23  1:02 AM   Specimen: Nasal Mucosa; Nasal Swab  Result Value Ref Range Status   MRSA, PCR NEGATIVE NEGATIVE Final   Staphylococcus aureus NEGATIVE NEGATIVE Final    Comment: (NOTE) The Xpert SA Assay (FDA approved for NASAL specimens in patients 44 years of age and older), is one component of a comprehensive surveillance program. It is not intended to diagnose infection nor to guide or monitor treatment. Performed at [facility] [address], [place], [place] [address]      Labs: BNP (last 3 results) No results for input(s): "BNP" in the last 8760 hours. Basic Metabolic Panel: Recent Labs  Lab 01/20/23 1825  01/21/23 0500 01/22/23 0512 01/23/23 0514  NA 140 140 136 134*  K 4.2 3.6 3.7 3.9  CL 103 106 103 101  CO2 '28 29 27 24  '$ GLUCOSE 118* 102* 135* 149*  BUN 31* 26* 21 17  CREATININE 0.96 0.97 0.89 0.84  CALCIUM 10.4* 9.3 8.7* 8.6*  MG  --   --   --  1.9  PHOS  --   --   --  2.4*   Liver Function Tests: Recent Labs  Lab  01/20/23 1825 01/23/23 0514  AST 28  --   ALT 20  --   ALKPHOS 62  --   BILITOT 0.4  --   PROT 7.0  --   ALBUMIN 3.9 2.7*   No results for input(s): "LIPASE", "AMYLASE" in the last 168 hours. No results for input(s): "AMMONIA" in the last 168 hours. CBC: Recent Labs  Lab 01/20/23 1825 01/21/23 0500 01/22/23 0512 01/23/23 0514  WBC 7.9 10.3 10.9* 13.0*  NEUTROABS 6.0  --   --   --   HGB 15.0 12.3* 11.6* 11.3*  HCT 45.6 38.3* 35.9* 34.0*  MCV 89.2 90.8 90.2 90.7  PLT 246 230 203 166   Cardiac Enzymes: No results for input(s): "CKTOTAL", "CKMB", "CKMBINDEX", "TROPONINI" in the last 168 hours. BNP: Invalid input(s): "POCBNP" CBG: Recent Labs  Lab 01/22/23 1822  GLUCAP 177*   D-Dimer No results for input(s): "DDIMER" in the last 72 hours. Hgb A1c No results for input(s): "HGBA1C" in the last 72 hours. Lipid Profile No results for input(s): "CHOL", "HDL", "LDLCALC", "TRIG", "CHOLHDL", "LDLDIRECT" in the last 72 hours. Thyroid function studies No results for input(s): "TSH", "T4TOTAL", "T3FREE", "THYROIDAB" in the last 72 hours.  Invalid input(s): "FREET3" Anemia work up Recent Labs    01/23/23 0514  VITAMINB12 837  FOLATE 15.5  FERRITIN 72  TIBC 320  IRON 19*  RETICCTPCT 1.3   Urinalysis    Component Value Date/Time   COLORURINE YELLOW (A) 01/20/2023 1925   APPEARANCEUR CLEAR 01/20/2023 1925   LABSPEC 1.010 01/20/2023 1925   PHURINE 6.0 01/20/2023 1925   GLUCOSEU NEGATIVE 01/20/2023 1925   HGBUR NEGATIVE 01/20/2023 1925   BILIRUBINUR NEGATIVE 01/20/2023 1925   KETONESUR NEGATIVE 01/20/2023 1925   PROTEINUR NEGATIVE 01/20/2023  1925   NITRITE NEGATIVE 01/20/2023 1925   LEUKOCYTESUR NEGATIVE 01/20/2023 1925   Sepsis Labs Recent Labs  Lab 01/20/23 1825 01/21/23 0500 01/22/23 0512 01/23/23 0514  WBC 7.9 10.3 10.9* 13.0*   Microbiology Recent Results (from the past 240 hour(s))  Surgical PCR screen     Status: None   Collection Time: 01/22/23  1:02 AM   Specimen: Nasal Mucosa; Nasal Swab  Result Value Ref Range Status   MRSA, PCR NEGATIVE NEGATIVE Final   Staphylococcus aureus NEGATIVE NEGATIVE Final    Comment: (NOTE) The Xpert SA Assay (FDA approved for NASAL specimens in patients 62 years of age and older), is one component of a comprehensive surveillance program. It is not intended to diagnose infection nor to guide or monitor treatment. Performed at [facility], [address], [place], [place] [address]      Time coordinating discharge: 35 minutes  SIGNED:   Rodena Goldmann, DO [facility] 01/23/2023, 1:53 PM  If 7PM-7AM, please contact night-coverage [email or web address]

## 2023-01-23 NOTE — Evaluation (Signed)
Physical Therapy Evaluation Patient Details Name: DARRIE SMUTNY MRN: [id number] DOB: 01/22/1941 Today's Date: 01/23/2023  History of Present Illness  ARPAN ARNWINE is a 82 y.o. male with medical history significant for COPD with chronic respiratory failure on 2 L, hypertension, BPH.  Patient presented to the ED with complaints of 7 feet fall off a ladder.  Patient is hard of hearing.          Patient reports he was removing a beds nest when he lost his balance and fell.  He did not hit his head.  He did not lose consciousness.  No chest pain or difficulty breathing.   Clinical Impression  Patient demonstrates slow labored movement for sitting up at bedside with fair/good return for moving LLE, unsteady on feet having to lean on armrest of chair for support during transfer without AD, fair/good return for maintaining NWB on LLE during ambulation using RW, but had difficulty making turns with near loss of balance.  Patient tolerated sitting up in chair after therapy - nursing staff notified.  Patient will benefit from continued skilled physical therapy in hospital and recommended venue below to increase strength, balance, endurance for safe ADLs and gait.         Recommendations for follow up therapy are one component of a multi-disciplinary discharge planning process, led by the attending physician.  Recommendations may be updated based on patient status, additional functional criteria and insurance authorization.  Follow Up Recommendations Skilled nursing-short term rehab (<3 hours/day) Can patient physically be transported by private vehicle: Yes    Assistance Recommended at Discharge    Patient can return home with the following  A little help with walking and/or transfers;A little help with bathing/dressing/bathroom;Help with stairs or ramp for entrance;Assistance with cooking/housework    Equipment Recommendations Rolling walker (2 wheels)  Recommendations for Other Services        Functional Status Assessment Patient has had a recent decline in their functional status and demonstrates the ability to make significant improvements in function in a reasonable and predictable amount of time.     Precautions / Restrictions Precautions Precautions: Fall Required Braces or Orthoses: Knee Immobilizer - Left Knee Immobilizer - Left: On when out of bed or walking Restrictions Weight Bearing Restrictions: Yes LLE Weight Bearing: Non weight bearing      Mobility  Bed Mobility Overal bed mobility: Needs Assistance Bed Mobility: Supine to Sit     Supine to sit: Supervision, Min guard     General bed mobility comments: increased time labored movement, fair/good return for moving LLE    Transfers Overall transfer level: Needs assistance Equipment used: Rolling walker (2 wheels) Transfers: Sit to/from Stand, Bed to chair/wheelchair/BSC Sit to Stand: Min guard, Min assist   Step pivot transfers: Min guard, Min assist       General transfer comment: unsteady labored movement    Ambulation/Gait Ambulation/Gait assistance: Herbalist (Feet): 40 Feet Assistive device: Rolling walker (2 wheels) Gait Pattern/deviations: Decreased step length - right, Decreased step length - left, Decreased stride length, Antalgic Gait velocity: decreased     General Gait Details: slow labord movement with fair/good return for keeping LLE of floor, had most difficulty making turns and limited due to fatigue, on 2 LPM with SpO2 at 94%  Stairs            Wheelchair Mobility    Modified Rankin (Stroke Patients Only)       Balance Overall balance assessment: Needs assistance  Sitting-balance support: Feet supported, No upper extremity supported Sitting balance-Leahy Scale: Fair Sitting balance - Comments: fair/good seated at EOB   Standing balance support: During functional activity, Bilateral upper extremity supported Standing balance-Leahy Scale:  Fair Standing balance comment: using RW                             Pertinent Vitals/Pain Pain Assessment Pain Assessment: Faces Faces Pain Scale: Hurts little more Pain Location: left knee Pain Descriptors / Indicators: Discomfort, Sore Pain Intervention(s): Limited activity within patient's tolerance, Monitored during session, Repositioned    Home Living Family/patient expects to be discharged to:: Private residence Living Arrangements: Other relatives Available Help at Discharge: Family;Available PRN/intermittently Type of Home: House Home Access: Stairs to enter Entrance Stairs-Rails: Right Entrance Stairs-Number of Steps: 5 Alternate Level Stairs-Number of Steps: Patient states he does not go upstairs Home Layout: Two level;Able to live on main level with bedroom/bathroom;Full bath on main level Home Equipment: Advice worker (2 wheels)      Prior Function Prior Level of Function : Independent/Modified Independent             Mobility Comments: Community ambulator without AD, drives, on 2 LPM constant home O2 ADLs Comments: Independent     Hand Dominance   Dominant Hand: Right    Extremity/Trunk Assessment   Upper Extremity Assessment Upper Extremity Assessment: Defer to OT evaluation    Lower Extremity Assessment Lower Extremity Assessment: Generalized weakness;LLE deficits/detail LLE Deficits / Details: grossly -4/5 LLE: Unable to fully assess due to immobilization LLE Sensation: WNL LLE Coordination: WNL    Cervical / Trunk Assessment Cervical / Trunk Assessment: Normal  Communication   Communication: No difficulties  Cognition Arousal/Alertness: Awake/alert Behavior During Therapy: WFL for tasks assessed/performed Overall Cognitive Status: Within Functional Limits for tasks assessed                                          General Comments      Exercises     Assessment/Plan    PT Assessment  Patient needs continued PT services  PT Problem List Decreased strength;Decreased activity tolerance;Decreased balance;Decreased mobility       PT Treatment Interventions DME instruction;Gait training;Stair training;Functional mobility training;Therapeutic activities;Therapeutic exercise;Patient/family education;Balance training    PT Goals (Current goals can be found in the Care Plan section)  Acute Rehab PT Goals Patient Stated Goal: return home after rehab PT Goal Formulation: With patient Time For Goal Achievement: 02/06/23 Potential to Achieve Goals: Good    Frequency Min 3X/week     Co-evaluation PT/OT/SLP Co-Evaluation/Treatment: Yes Reason for Co-Treatment: To address functional/ADL transfers PT goals addressed during session: Mobility/safety with mobility;Balance;Proper use of DME         AM-PAC PT "6 Clicks" Mobility  Outcome Measure Help needed turning from your back to your side while in a flat bed without using bedrails?: None Help needed moving from lying on your back to sitting on the side of a flat bed without using bedrails?: A Little Help needed moving to and from a bed to a chair (including a wheelchair)?: A Little Help needed standing up from a chair using your arms (e.g., wheelchair or bedside chair)?: A Little Help needed to walk in hospital room?: A Lot Help needed climbing 3-5 steps with a railing? : A Lot 6 Click Score: 17  End of Session Equipment Utilized During Treatment: Oxygen Activity Tolerance: Patient tolerated treatment well;Patient limited by fatigue Patient left: in chair;with call bell/phone within reach Nurse Communication: Mobility status PT Visit Diagnosis: Unsteadiness on feet (R26.81);Other abnormalities of gait and mobility (R26.89);Muscle weakness (generalized) (M62.81)    Time: [id number] PT Time Calculation (min) (ACUTE ONLY): 20 min   Charges:   PT Evaluation $PT Eval Moderate Complexity: 1 Mod PT  Treatments $Therapeutic Activity: 8-22 mins        1:33 PM, 01/23/23 Lonell Grandchild, MPT Physical Therapist with [facility] 336 [phone] office [phone] mobile phone

## 2023-01-23 NOTE — Progress Notes (Signed)
PROGRESS NOTE    Michael Evans  [id number] DOB: Nov 26, 1941 DOA: 01/20/2023 PCP: [name], MD   Brief Narrative:    82/M with history of COPD, chronic respiratory failure on 2 L home O2, hypertension, BPH, presented to the ED after a fall from 7 feet off a ladder, reportedly lost his balance and fell, did not hit his head, LOC no loss of consciousness, developed severe left knee pain, x-rays noted comminuted distracted patella fracture and blood pressure of 171/81.  Orthopedic surgery consulted and he underwent ORIF of left patellar fracture 2/22.  He now remains in a knee immobilizer and has been seen by PT/OT with recommendations for SNF placement.  Assessment & Plan:   Principal Problem:   Comminuted fracture of left patella Active Problems:   Essential hypertension   COPD with emphysema (HCC)   Chronic respiratory failure with hypoxia (HCC)  Assessment and Plan:   Accidental fall off the ladder, 7 ft high-no head trauma or LOC.  No prodromes. Comminuted fracture of left patella due to fall. -Status post ORIF 2/22 -PT recommending SNF placement In knee immobilizers pending surgical repair. -Pain control and VTE prophylaxis with aspirin twice daily to start 2/23 -Bowel regimen   COPD with emphysema/chronic hypoxic RF on 2 L.  Stable. -Continue home regimen   BPH: Stable. -Resume Flomax and Proscar    DVT prophylaxis: Aspirin twice daily Code Status: Full Family Communication: None at bedside Disposition Plan: Placement to SNF Status is: Inpatient Remains inpatient appropriate because: Need for IV medications  Consultants:  Orthopedics  Procedures:  Left patella ORIF 2/22  Antimicrobials:  Anti-infectives (From admission, onward)    Start     Dose/Rate Route Frequency Ordered Stop   01/22/23 2200  ceFAZolin (ANCEF) IVPB 2g/100 mL premix        2 g 200 mL/hr over 30 Minutes Intravenous Every 8 hours 01/22/23 1733 01/23/23 2159   01/22/23 1406   ceFAZolin (ANCEF) 2-4 GM/100ML-% IVPB       Note to Pharmacy: Abbie Sons S: cabinet override      01/22/23 1406 01/22/23 1443   01/22/23 1200  ceFAZolin (ANCEF) IVPB 2g/100 mL premix        2 g 200 mL/hr over 30 Minutes Intravenous On call to O.R. 01/21/23 1540 01/22/23 1445       Subjective: Patient seen and evaluated today with no new acute complaints or concerns. No acute concerns or events noted overnight.  He denies any significant pain.  Objective: Vitals:   01/23/23 0800 01/23/23 0900 01/23/23 1000 01/23/23 1100  BP:  (!) 109/50 (!) 84/40 (!) 95/38  Pulse:  86  72  Resp:  (!) '22 18 20  '$ Temp: 97.7 F (36.5 C)     TempSrc: Oral     SpO2:  97%  93%  Weight:      Height:        Intake/Output Summary (Last 24 hours) at 01/23/2023 1220 Last data filed at 01/23/2023 0755 Gross per 24 hour  Intake 1100 ml  Output 400 ml  Net 700 ml   Filed Weights   01/22/23 1320 01/22/23 1746 01/23/23 0538  Weight: 57.1 kg 62.3 kg 64 kg    Examination:  General exam: Appears calm and comfortable  Respiratory system: Clear to auscultation. Respiratory effort normal. Cardiovascular system: S1 & S2 heard, RRR.  Gastrointestinal system: Abdomen is soft Central nervous system: Alert and awake Extremities: Left knee dressings C/D/I with knee immobilizer present Skin: No  significant lesions noted Psychiatry: Flat affect.    Data Reviewed: I have personally reviewed following labs and imaging studies  CBC: Recent Labs  Lab 01/20/23 1825 01/21/23 0500 01/22/23 0512 01/23/23 0514  WBC 7.9 10.3 10.9* 13.0*  NEUTROABS 6.0  --   --   --   HGB 15.0 12.3* 11.6* 11.3*  HCT 45.6 38.3* 35.9* 34.0*  MCV 89.2 90.8 90.2 90.7  PLT 246 230 203 [id number]   Basic Metabolic Panel: Recent Labs  Lab 01/20/23 1825 01/21/23 0500 01/22/23 0512 01/23/23 0514  NA 140 140 136 134*  K 4.2 3.6 3.7 3.9  CL 103 106 103 101  CO2 '28 29 27 24  '$ GLUCOSE 118* 102* 135* 149*  BUN 31* 26* 21 17   CREATININE 0.96 0.97 0.89 0.84  CALCIUM 10.4* 9.3 8.7* 8.6*  MG  --   --   --  1.9  PHOS  --   --   --  2.4*   GFR: Estimated Creatinine Clearance: 56.9 mL/min (by C-G formula based on SCr of 0.84 mg/dL). Liver Function Tests: Recent Labs  Lab 01/20/23 1825 01/23/23 0514  AST 28  --   ALT 20  --   ALKPHOS 62  --   BILITOT 0.4  --   PROT 7.0  --   ALBUMIN 3.9 2.7*   No results for input(s): "LIPASE", "AMYLASE" in the last 168 hours. No results for input(s): "AMMONIA" in the last 168 hours. Coagulation Profile: Recent Labs  Lab 01/20/23 1825  INR 0.9   Cardiac Enzymes: No results for input(s): "CKTOTAL", "CKMB", "CKMBINDEX", "TROPONINI" in the last 168 hours. BNP (last 3 results) No results for input(s): "PROBNP" in the last 8760 hours. HbA1C: No results for input(s): "HGBA1C" in the last 72 hours. CBG: Recent Labs  Lab 01/22/23 1822  GLUCAP 177*   Lipid Profile: No results for input(s): "CHOL", "HDL", "LDLCALC", "TRIG", "CHOLHDL", "LDLDIRECT" in the last 72 hours. Thyroid Function Tests: No results for input(s): "TSH", "T4TOTAL", "FREET4", "T3FREE", "THYROIDAB" in the last 72 hours. Anemia Panel: Recent Labs    01/23/23 0514  VITAMINB12 837  FOLATE 15.5  FERRITIN 72  TIBC 320  IRON 19*  RETICCTPCT 1.3   Sepsis Labs: No results for input(s): "PROCALCITON", "LATICACIDVEN" in the last 168 hours.  Recent Results (from the past 240 hour(s))  Surgical PCR screen     Status: None   Collection Time: 01/22/23  1:02 AM   Specimen: Nasal Mucosa; Nasal Swab  Result Value Ref Range Status   MRSA, PCR NEGATIVE NEGATIVE Final   Staphylococcus aureus NEGATIVE NEGATIVE Final    Comment: (NOTE) The Xpert SA Assay (FDA approved for NASAL specimens in patients 46 years of age and older), is one component of a comprehensive surveillance program. It is not intended to diagnose infection nor to guide or monitor treatment. Performed at [facility], [address]  [place], [place] [address]          Radiology Studies: DG Knee 1-2 Views Left  Result Date: 01/22/2023 CLINICAL DATA:  Postop after patellar fracture repair EXAM: LEFT KNEE - 1-2 VIEW COMPARISON:  Radiographs 01/20/2023 FINDINGS: Expected postoperative change after ORIF of the left patella fracture. Cutaneous staples. Mild adjacent edema. No radiographic evidence of hardware loosening. IMPRESSION: Expected postoperative change after ORIF of the left patella fracture. Electronically Signed   By: Placido Sou M.D.   On: 01/22/2023 17:39   DG Knee 1-2 Views Left  Result Date: 01/22/2023 CLINICAL DATA:  Fluoro  guidance provided EXAM: LEFT KNEE - 1-2 VIEW FINDINGS: Dose: 2.9 mGy Fluoro time 41 s IMPRESSION: C-arm fluoro guidance provided. Electronically Signed   By: Sammie Bench M.D.   On: 01/22/2023 16:44   DG C-Arm 1-60 Min-No Report  Result Date: 01/22/2023 Fluoroscopy was utilized by the requesting physician.  No radiographic interpretation.        Scheduled Meds:  aspirin EC  81 mg Oral BID   Chlorhexidine Gluconate Cloth  6 each Topical Daily   finasteride  5 mg Oral Daily   influenza vaccine adjuvanted  0.5 mL Intramuscular Tomorrow-1000   mometasone-formoterol  2 puff Inhalation BID   mupirocin ointment  1 Application Nasal BID   pneumococcal 20-valent conjugate vaccine  0.5 mL Intramuscular Tomorrow-1000   polyethylene glycol  17 g Oral BID   tamsulosin  0.8 mg Oral Daily   umeclidinium bromide  1 puff Inhalation Daily   Continuous Infusions:   ceFAZolin (ANCEF) IV 2 g (01/23/23 0506)     LOS: 3 days    Time spent: 35 minutes    Raeshawn Tafolla Darleen Crocker, DO [facility]  If 7PM-7AM, please contact night-coverage [email or web address] 01/23/2023, 12:20 PM

## 2023-01-23 NOTE — Progress Notes (Signed)
Patient Covid test come back positive. MD Manuella Ghazi made aware. Precautions placed on patients room door. Family and patient made aware.

## 2023-01-23 NOTE — TOC Initial Note (Signed)
Transition of Care [facility]) - Initial/Assessment Note    Patient Details  Name: Michael Evans MRN: [id number] Date of Birth: [birth year]  Transition of [facility]) CM/SW Contact:    Shade Flood, LCSW Phone Number: 01/23/2023, 1:14 PM  Clinical Narrative:                  Pt admitted from home. PT recommending SNF rehab. Met with pt at bedside to review dc planning.   Per pt, he has been staying with his sister at her home in [place]. He is agreeable to SNF rehab. CMS provider options reviewed. Will refer as requested. Updated pt's sister at his request.  Will start insurance auth and follow for dc planning.  Expected Discharge Plan: Skilled Nursing Facility Barriers to Discharge: Continued Medical Work up   Patient Goals and CMS Choice Patient states their goals for this hospitalization and ongoing recovery are:: rehab CMS Medicare.gov Compare Post Acute Care list provided to:: Patient Choice offered to / list presented to : Patient [place] ownership interest in [facility].provided to:: Patient    Expected Discharge Plan and Services In-house Referral: Clinical Social Work   Post Acute Care Choice: [place] Living arrangements for the past 2 months: [place]                                      Prior Living Arrangements/Services Living arrangements for the past 2 months: Single Family Home Lives with:: Siblings Patient language and need for interpreter reviewed:: Yes Do you feel safe going back to the place where you live?: Yes      Need for Family Participation in Patient Care: No (Comment) Care giver support system in place?: Yes (comment)   Criminal Activity/Legal Involvement Pertinent to Current Situation/Hospitalization: No - Comment as needed  Activities of Daily Living Home Assistive Devices/Equipment: None ADL Screening (condition at time of admission) Patient's cognitive ability adequate to safely complete  daily activities?: Yes Is the patient deaf or have difficulty hearing?: Yes Does the patient have difficulty seeing, even when wearing glasses/contacts?: Yes Does the patient have difficulty concentrating, remembering, or making decisions?: No Patient able to express need for assistance with ADLs?: No Does the patient have difficulty dressing or bathing?: No Independently performs ADLs?: No Communication: Independent Dressing (OT): Needs assistance Is this a change from baseline?: Change from baseline, expected to last <3days Grooming: Needs assistance Is this a change from baseline?: Change from baseline, expected to last <3 days Feeding: Independent Bathing: Independent Toileting: Needs assistance Is this a change from baseline?: Change from baseline, expected to last <3 days In/Out Bed: Dependent Is this a change from baseline?: Change from baseline, expected to last <3 days Walks in Home: Needs assistance Is this a change from baseline?: Change from baseline, expected to last <3 days Does the patient have difficulty walking or climbing stairs?: Yes Weakness of Legs: Both Weakness of Arms/Hands: None  Permission Sought/Granted Permission sought to share information with : Chartered certified accountant granted to share information with : Yes, Verbal Permission Granted     Permission granted to share info w AGENCY: snfs        Emotional Assessment Appearance:: Appears stated age Attitude/Demeanor/Rapport: Engaged Affect (typically observed): Pleasant Orientation: : Oriented to Self, Oriented to Place, Oriented to  Time, Oriented to Situation Alcohol / Substance Use: Not Applicable Psych Involvement: No (  comment)  Admission diagnosis:  Comminuted fracture of left patella [S82.042A] Patient Active Problem List   Diagnosis Date Noted   Comminuted fracture of left patella 01/20/2023   Physical deconditioning 11/20/2022   Femoral hernia 11/19/2022   Sepsis ([place])  05/23/2022   Sinus bradycardia 03/20/2022   GERD (gastroesophageal reflux disease) 03/19/2022   Preop pulmonary/respiratory exam 11/29/2021   Left inguinal hernia 11/14/2021   Upper airway cough syndrome 10/31/2021   Partial small bowel obstruction ([place]) 10/18/2021   Dehydration 10/18/2021   Hypertensive urgency 10/18/2021   Chronic respiratory failure with hypoxia ([place]) 10/18/2021   Abdominal pain 10/18/2021   Bilateral inguinal hernia with obstruction and without gangrene    Femoral hernia of right side 10/08/2021   COPD GOLD 2 09/26/2021   Acute on chronic respiratory failure ([place]) 09/24/2021   Tachycardia 09/26/2017   Influenza A 01/24/2016   Acute on chronic respiratory failure with hypoxia ([place]) 01/22/2016   SIRS (systemic inflammatory response syndrome) ([place]) 01/22/2016   Leukocytosis 01/22/2016   Hyperglycemia 01/22/2016   COPD exacerbation ([place]) 02/14/2015   Shingles rash 08/26/2014   Hypoxia 08/26/2014   OSA (obstructive sleep apnea) 04/21/2011   HOARSENESS 10/03/2009   RHINITIS 08/02/2009   COPD with emphysema ([place]) 07/05/2009   Essential hypertension 06/07/2009   PCP:  [name], MD Pharmacy:   [id number] [id number]: [phone]: [phone]    Social Determinants of Health (SDOH) Social History: SDOH Screenings   Food Insecurity: No Food Insecurity (01/20/2023)  Housing: Low Risk  (01/20/2023)  Transportation Needs: No Transportation Needs (01/20/2023)  Utilities: Not At Risk (01/20/2023)  Alcohol Screen: Low Risk  (02/07/2022)  Depression (PHQ2-9): Low Risk  (10/16/2022)  Financial Resource Strain: Low Risk  (02/07/2022)  Physical Activity: Insufficiently Active (02/07/2022)  Social Connections: Socially Isolated (02/07/2022)  Stress: No Stress Concern Present (02/07/2022)  Tobacco Use: Medium Risk (01/22/2023)   SDOH Interventions:      Readmission Risk Interventions    01/23/2023    1:13 PM  Readmission Risk Prevention Plan  Medication Screening Complete  Transportation Screening Complete

## 2023-01-23 NOTE — Progress Notes (Signed)
Patient wheezing, requesting breathing treatment. Respiratory made aware. Will continue to monitor.

## 2023-01-23 NOTE — Plan of Care (Signed)
  Problem: Acute Rehab PT Goals(only PT should resolve) Goal: Pt Will Go Supine/Side To Sit Outcome: Progressing Flowsheets (Taken 01/23/2023 1334) Pt will go Supine/Side to Sit: with modified independence Goal: Patient Will Transfer Sit To/From Stand Outcome: Progressing Flowsheets (Taken 01/23/2023 1334) Patient will transfer sit to/from stand:  with min guard assist  with minimal assist Goal: Pt Will Transfer Bed To Chair/Chair To Bed Outcome: Progressing Flowsheets (Taken 01/23/2023 1334) Pt will Transfer Bed to Chair/Chair to Bed:  min guard assist  with min assist Goal: Pt Will Ambulate Outcome: Progressing Flowsheets (Taken 01/23/2023 1334) Pt will Ambulate:  50 feet  with min guard assist  with minimal assist  with rolling walker   1:35 PM, 01/23/23 Lonell Grandchild, MPT Physical Therapist with [facility] [phone] office [phone] mobile phone

## 2023-01-23 NOTE — NC FL2 (Signed)
[place] LEVEL OF CARE FORM     IDENTIFICATION  Patient Name: Michael Evans Birthdate: 04-May-1941 Sex: male Admission Date (Current Location): 01/20/2023  [facility] and [place] Number:  [employer] and Address:  [place] [address] [place]      Provider Number: [id number]  Attending Physician Name and Address:  Rodena Goldmann, DO  Relative Name and Phone Number:       Current Level of Care: Hospital Recommended Level of Care: [place] Prior Approval Number:    Date Approved/Denied:   PASRR Number: [id number]  Discharge Plan: SNF    Current Diagnoses: Patient Active Problem List   Diagnosis Date Noted   Comminuted fracture of left patella 01/20/2023   Physical deconditioning 11/20/2022   Femoral hernia 11/19/2022   Sepsis ([place]) 05/23/2022   Sinus bradycardia 03/20/2022   GERD (gastroesophageal reflux disease) 03/19/2022   Preop pulmonary/respiratory exam 11/29/2021   Left inguinal hernia 11/14/2021   Upper airway cough syndrome 10/31/2021   Partial small bowel obstruction ([place]) 10/18/2021   Dehydration 10/18/2021   Hypertensive urgency 10/18/2021   Chronic respiratory failure with hypoxia ([place]) 10/18/2021   Abdominal pain 10/18/2021   Bilateral inguinal hernia with obstruction and without gangrene    Femoral hernia of right side 10/08/2021   COPD GOLD 2 09/26/2021   Acute on chronic respiratory failure ([place]) 09/24/2021   Tachycardia 09/26/2017   Influenza A 01/24/2016   Acute on chronic respiratory failure with hypoxia (HCC) 01/22/2016   SIRS (systemic inflammatory response syndrome) ([place]) 01/22/2016   Leukocytosis 01/22/2016   Hyperglycemia 01/22/2016   COPD exacerbation (HCC) 02/14/2015   Shingles rash 08/26/2014   Hypoxia 08/26/2014   OSA (obstructive sleep apnea) 04/21/2011   HOARSENESS 10/03/2009   RHINITIS 08/02/2009   COPD with emphysema ([place]) 07/05/2009   Essential  hypertension 06/07/2009    Orientation RESPIRATION BLADDER Height & Weight     Self, Situation, Time, Place  O2 (see dc summary) Continent Weight: 141 lb 1.5 oz (64 kg) Height:  '5\' 5"'$  (165.1 cm)  BEHAVIORAL SYMPTOMS/MOOD NEUROLOGICAL BOWEL NUTRITION STATUS      Continent Diet (see dc summary)  AMBULATORY STATUS COMMUNICATION OF NEEDS Skin   Extensive Assist Verbally Normal                       Personal Care Assistance Level of Assistance  Bathing, Feeding, Dressing Bathing Assistance: Limited assistance Feeding assistance: Independent Dressing Assistance: Limited assistance     Functional Limitations Info  Sight, Hearing, Speech Sight Info: Impaired Hearing Info: Impaired Speech Info: Adequate    SPECIAL CARE FACTORS FREQUENCY  PT (By licensed PT), OT (By licensed OT)     PT Frequency: 5x week OT Frequency: 3x week            Contractures Contractures Info: Not present    Additional Factors Info  Code Status, Allergies Code Status Info: Full Allergies Info: Benzyl Alcohol, Amlodipine, Aspirin, Other, Penicillins, Sulfa Antibiotics, Tylenol (Acetaminophen)           Current Medications (01/23/2023):  This is the current hospital active medication list Current Facility-Administered Medications  Medication Dose Route Frequency Provider Last Rate Last Admin   aspirin EC tablet 81 mg  81 mg Oral BID Heath Lark D, DO   81 mg at 01/23/23 1126   ceFAZolin (ANCEF) IVPB 2g/100 mL premix  2 g Intravenous Q8H Mordecai Rasmussen, MD 200 mL/hr  at 01/23/23 0506 2 g at 01/23/23 0506   Chlorhexidine Gluconate Cloth 2 % PADS 6 each  6 each Topical Daily Wendee Beavers T, MD   6 each at 01/22/23 1748   finasteride (PROSCAR) tablet 5 mg  5 mg Oral Daily Mordecai Rasmussen, MD   5 mg at 01/22/23 2241   influenza vaccine adjuvanted (FLUAD) injection 0.5 mL  0.5 mL Intramuscular Tomorrow-1000 Emokpae, Ejiroghene E, MD       ipratropium-albuterol (DUONEB) 0.5-2.5 (3) MG/3ML nebulizer  solution 3 mL  3 mL Nebulization Q4H PRN Mordecai Rasmussen, MD       labetalol (NORMODYNE) injection 10 mg  10 mg Intravenous Q2H PRN Mordecai Rasmussen, MD       mometasone-formoterol [facility]) 100-5 MCG/ACT inhaler 2 puff  2 puff Inhalation BID Mordecai Rasmussen, MD   2 puff at 01/23/23 0753   morphine (PF) 2 MG/ML injection 2 mg  2 mg Intravenous Q4H PRN Mordecai Rasmussen, MD   2 mg at 01/22/23 2132   mupirocin ointment (BACTROBAN) 2 % 1 Application  1 Application Nasal BID Mordecai Rasmussen, MD   1 Application at [id number] 1126   ondansetron (ZOFRAN) tablet 4 mg  4 mg Oral Q6H PRN Mordecai Rasmussen, MD       Or   ondansetron [facility]) injection 4 mg  4 mg Intravenous Q6H PRN Mordecai Rasmussen, MD       pneumococcal 20-valent conjugate vaccine (PREVNAR 20) injection 0.5 mL  0.5 mL Intramuscular Tomorrow-1000 Larena Glassman A, MD       polyethylene glycol (MIRALAX / GLYCOLAX) packet 17 g  17 g Oral BID Mordecai Rasmussen, MD   17 g at 01/23/23 1126   tamsulosin (FLOMAX) capsule 0.8 mg  0.8 mg Oral Daily Larena Glassman A, MD   0.8 mg at 01/22/23 2241   umeclidinium bromide (INCRUSE ELLIPTA) 62.5 MCG/ACT 1 puff  1 puff Inhalation Daily Mordecai Rasmussen, MD   1 puff at 01/22/23 [id number]     Discharge Medications: Please see discharge summary for a list of discharge medications.  Relevant Imaging Results:  Relevant Lab Results:   Additional Information SSN: [id number] LCSW

## 2023-01-23 NOTE — Progress Notes (Signed)
   ORTHOPAEDIC PROGRESS NOTE  S/p ORIF of left patella fracture  DOS: 01/22/2023  SUBJECTIVE: ICU over night.  No problems.  Pain is controlled.  Eating and drinking well.  No fevers or chills.  OBJECTIVE: PE:  Elderly male.  No acute distress.  Thin male.  Left knee in a knee immobilizer. Dressing is clean, dry and intact.  Toes are warm and well-perfused. Active motion intact to his toes and ankle. Sensation intact over the dorsum of his foot.  Vitals:   01/23/23 0700 01/23/23 0800  BP: (!) 102/44   Pulse: 76   Resp: 20   Temp:  97.7 F (36.5 C)  SpO2: 97%        Latest Ref Rng & Units 01/23/2023    5:14 AM 01/22/2023    5:12 AM 01/21/2023    5:00 AM  CBC  WBC 4.0 - 10.5 K/uL 13.0  10.9  10.3   Hemoglobin 13.0 - 17.0 g/dL 11.3  11.6  12.3   Hematocrit 39.0 - 52.0 % 34.0  35.9  38.3   Platelets 150 - 400 K/uL 166  203  230       ASSESSMENT: Michael Evans is a 82 y.o. male doing well. Breathing at baseline.  Stable POD#1  PLAN: Weightbearing: NWB LLE Insicional and dressing care: Reinforce dressings as needed; knee immobilizer to remain in place Orthopedic device(s): Knee immobilizer VTE prophylaxis: Recommend Aspirin 54m BID; to begin POD#1 Pain control: PO pain medications as needed; judicious use of narcotics Follow - up plan: 2 weeks  Work with PT/OT; dispo pending  Contact information:     Dimitri Shakespeare A. CAmedeo Kinsman MD [address]  [place]  [id number]: [phone]: [phone]

## 2023-01-23 NOTE — Anesthesia Postprocedure Evaluation (Signed)
Anesthesia Post Note  Patient: Michael Evans  Procedure(s) Performed: OPEN REDUCTION INTERNAL FIXATION (ORIF) PATELLA (Left: Knee)  Patient location during evaluation: ICU Anesthesia Type: General Level of consciousness: awake and alert and oriented Pain management: pain level controlled Vital Signs Assessment: post-procedure vital signs reviewed and stable Respiratory status: spontaneous breathing, nonlabored ventilation, respiratory function stable and patient connected to nasal cannula oxygen Cardiovascular status: blood pressure returned to baseline and stable Postop Assessment: no apparent nausea or vomiting Anesthetic complications: no  No notable events documented.   Last Vitals:  Vitals:   01/23/23 1300 01/23/23 1400  BP: (!) 95/41 (!) 97/46  Pulse: 73 93  Resp: (!) 22 (!) 25  Temp:    SpO2: 93% 91%    Last Pain:  Vitals:   01/23/23 0800  TempSrc: Oral  PainSc:                  [name] [name]

## 2023-01-23 NOTE — Evaluation (Signed)
Occupational Therapy Evaluation Patient Details Name: Michael Evans MRN: [id number] DOB: 07/05/1941 Today's Date: 01/23/2023   History of Present Illness Michael Evans is a 82 y.o. male with medical history significant for COPD with chronic respiratory failure on 2 L, hypertension, BPH.  Patient presented to the ED with complaints of 7 feet fall off a ladder.  Patient is hard of hearing.          Patient reports he was removing a beds nest when he lost his balance and fell.  He did not hit his head.  He did not lose consciousness.  No chest pain or difficulty breathing.   Clinical Impression   Pt agreeable to OT and PT co-evaluation. Pt demonstrated need for min G to min A for mobility with NWB status on L LE. Pt demonstrates WFL A/ROM of B UE. Limiting factor for ADL's is spline and NWB with L LE.  Moderate assist for lower body dressing. Pt was left in the chair with call bell within reach. Pt will benefit from continued OT in the hospital and recommended venue below to increase strength, balance, and endurance for safe ADL's.        Recommendations for follow up therapy are one component of a multi-disciplinary discharge planning process, led by the attending physician.  Recommendations may be updated based on patient status, additional functional criteria and insurance authorization.   Follow Up Recommendations  Skilled nursing-short term rehab (<3 hours/day)     Assistance Recommended at Discharge Intermittent Supervision/Assistance  Patient can return home with the following A little help with walking and/or transfers;A lot of help with bathing/dressing/bathroom;Assistance with cooking/housework;Assist for transportation;Help with stairs or ramp for entrance    Functional Status Assessment  Patient has had a recent decline in their functional status and demonstrates the ability to make significant improvements in function in a reasonable and predictable amount of time.  Equipment  Recommendations  None recommended by OT    Recommendations for Other Services Other (comment) (Optometrist evaluation. Reports long standing blurry vision.)     Precautions / Restrictions Precautions Precautions: Fall Required Braces or Orthoses: Knee Immobilizer - Left Knee Immobilizer - Left: On when out of bed or walking Restrictions Weight Bearing Restrictions: Yes LLE Weight Bearing: Non weight bearing      Mobility Bed Mobility Overal bed mobility: Needs Assistance Bed Mobility: Supine to Sit     Supine to sit: Supervision, Min guard     General bed mobility comments: increased time labored movement, fair/good return for moving LLE    Transfers Overall transfer level: Needs assistance Equipment used: Rolling walker (2 wheels) Transfers: Sit to/from Stand, Bed to chair/wheelchair/BSC Sit to Stand: Min guard, Min assist     Step pivot transfers: Min guard, Min assist     General transfer comment: unsteady labored movement      Balance      01/23/23 1232  Balance  Overall balance assessment Needs assistance  Sitting-balance support Feet supported;No upper extremity supported  Sitting balance-Leahy Scale Fair  Sitting balance - Comments fair/good seated at EOB  Standing balance support During functional activity;Bilateral upper extremity supported  Standing balance-Leahy Scale Fair  Standing balance comment using RW                                         ADL either performed or assessed with clinical judgement  ADL Overall ADL's : Needs assistance/impaired     Grooming: Set up;Sitting   Upper Body Bathing: Set up;Sitting   Lower Body Bathing: Moderate assistance;Sitting/lateral leans       Lower Body Dressing: Moderate assistance;Sitting/lateral leans   Toilet Transfer: Minimal assistance;Min guard;Ambulation;Rolling walker (2 wheels) Toilet Transfer Details (indicate cue type and reason): Simulated via EOB to chair with  RW Toileting- Clothing Manipulation and Hygiene: Set up;Sitting/lateral lean       Functional mobility during ADLs: Min guard;Minimal assistance;Rolling walker (2 wheels)       Vision Baseline Vision/History:  (Blurry vision but does not have glasses.) Ability to See in Adequate Light: 1 Impaired Patient Visual Report: No change from baseline Vision Assessment?:  (Blurry vision at baseline.)                Pertinent Vitals/Pain Pain Assessment Pain Assessment: 0-10 Pain Score: 10-Worst pain ever Pain Location: left knee Pain Descriptors / Indicators: Sharp Pain Intervention(s): Limited activity within patient's tolerance, Monitored during session, Repositioned     Hand Dominance Right   Extremity/Trunk Assessment Upper Extremity Assessment Upper Extremity Assessment: Generalized weakness   Lower Extremity Assessment Lower Extremity Assessment: Defer to PT evaluation LLE Deficits / Details: grossly -4/5 LLE: Unable to fully assess due to immobilization LLE Sensation: WNL LLE Coordination: WNL   Cervical / Trunk Assessment Cervical / Trunk Assessment: Normal   Communication Communication Communication: No difficulties   Cognition Arousal/Alertness: Awake/alert Behavior During Therapy: WFL for tasks assessed/performed Overall Cognitive Status: Within Functional Limits for tasks assessed                                                        Home Living Family/patient expects to be discharged to:: Private residence Living Arrangements: Other relatives Available Help at Discharge: Family;Available PRN/intermittently Type of Home: House Home Access: Stairs to enter [employer] of Steps: 5 Entrance Stairs-Rails: Right Home Layout: Two level;Able to live on main level with bedroom/bathroom;Full bath on main level Alternate Level Stairs-Number of Steps: Patient states he does not go upstairs   Bathroom Shower/Tub: Arts administrator: Standard Bathroom Accessibility:  (Pt is unsure)   Home Equipment: Cane - single point          Prior Functioning/Environment Prior Level of Function : Independent/Modified Independent             Mobility Comments: Community ambulator without AD, drives, on 2 LPM constant home O2 ADLs Comments: Independent        OT Problem List: Decreased strength;Decreased range of motion;Decreased activity tolerance;Impaired balance (sitting and/or standing);Pain      OT Treatment/Interventions: Self-care/ADL training;Therapeutic exercise;Therapeutic activities;Patient/family education;Balance training;DME and/or AE instruction    OT Goals(Current goals can be found in the care plan section) Acute Rehab OT Goals Patient Stated Goal: Return home. OT Goal Formulation: With patient Time For Goal Achievement: 02/06/23 Potential to Achieve Goals: Good  OT Frequency: Min 2X/week    Co-evaluation PT/OT/SLP Co-Evaluation/Treatment: Yes Reason for Co-Treatment: To address functional/ADL transfers PT goals addressed during session: Mobility/safety with mobility;Balance;Proper use of DME OT goals addressed during session: ADL's and self-care                       End of Session Equipment Utilized During Treatment: Rolling  walker (2 wheels)  Activity Tolerance: Patient tolerated treatment well Patient left: in chair;with call bell/phone within reach  OT Visit Diagnosis: Unsteadiness on feet (R26.81);Other abnormalities of gait and mobility (R26.89);Muscle weakness (generalized) (M62.81);Pain Pain - Right/Left: Left Pain - part of body: Knee                Time: [id number] OT Time Calculation (min): 31 min Charges:  OT General Charges $OT Visit: 1 Visit OT Evaluation $OT Eval Low Complexity: 1 Low  Ajanee Buren OT, MOT   Larey Seat 01/23/2023, 12:54 PM

## 2023-01-23 NOTE — Progress Notes (Addendum)
   01/23/23 2004  Vitals  Temp (!) 101.1 F (38.4 C)  BP (!) 103/51  MAP (mmHg) 66  BP Location Right Arm  BP Method Automatic  Patient Position (if appropriate) Lying  Pulse Rate 99  Pulse Rate Source Monitor  Resp 20  MEWS COLOR  MEWS Score Color Green  Oxygen Therapy  SpO2 93 %  O2 Device Nasal Cannula  O2 Flow Rate (L/min) 3 L/min  MEWS Score  MEWS Temp 1  MEWS Systolic 0  MEWS Pulse 0  MEWS RR 0  MEWS LOC 0  MEWS Score 1   Oncall provider Fuller Plan MD made aware of pt temperature. Received order for one time dose Ibuprofen and if temperature is greater than 100.9 to obtain blood cultures. Will continue to monitor.

## 2023-01-23 NOTE — TOC Progression Note (Signed)
Transition of Care [facility]) - Progression Note    Patient Details  Name: CLYNTON SHANHOLTZ MRN: [id number] Date of Birth: [birth year]  Transition of Care [facility]) CM/SW Contact  Shade Flood, LCSW Phone Number: 01/23/2023, 3:38 PM  Clinical Narrative:     Met with pt to review bed offers. Pt selects [facility]. Updated Kerri at [facility] who states pt can transfer once he has insurance auth and negative covid test.  Insurance Josem Kaufmann is still pending.  Updated MD. Donella Stade will follow.  Expected Discharge Plan: [place] Barriers to Discharge: Continued Medical Work up  Expected Discharge Plan and Services In-house Referral: Clinical Social Work   Post Acute Care Choice: [place] Living arrangements for the past 2 months: Single Family Home Expected Discharge Date: 01/23/23                                     Social Determinants of Health (SDOH) Interventions SDOH Screenings   Food Insecurity: No Food Insecurity (01/20/2023)  Housing: Low Risk  (01/20/2023)  Transportation Needs: No Transportation Needs (01/20/2023)  Utilities: Not At Risk (01/20/2023)  Alcohol Screen: Low Risk  (02/07/2022)  Depression (PHQ2-9): Low Risk  (10/16/2022)  Financial Resource Strain: Low Risk  (02/07/2022)  Physical Activity: Insufficiently Active (02/07/2022)  Social Connections: Socially Isolated (02/07/2022)  Stress: No Stress Concern Present (02/07/2022)  Tobacco Use: Medium Risk (01/22/2023)    Readmission Risk Interventions    01/23/2023    1:13 PM  Readmission Risk Prevention Plan  Medication Screening Complete  Transportation Screening Complete

## 2023-01-23 NOTE — Care Management Important Message (Signed)
Important Message  Patient Details  Name: Michael Evans MRN: [id number] Date of Birth: 05-02-1941   Medicare Important Message Given:  Yes     Tommy Medal 01/23/2023, 10:38 AM

## 2023-01-23 NOTE — Progress Notes (Signed)
Patient having pain in his L lower leg. Pain 10/10. His systolic is greater than [id number]. PRN morphine given. Will continue to monitor.

## 2023-01-24 ENCOUNTER — Inpatient Hospital Stay (HOSPITAL_COMMUNITY): Payer: 59

## 2023-01-24 LAB — BASIC METABOLIC PANEL
Anion gap: 6 (ref 5–15)
BUN: 20 mg/dL (ref 8–23)
CO2: 28 mmol/L (ref 22–32)
Calcium: 8.2 mg/dL — ABNORMAL LOW (ref 8.9–10.3)
Chloride: 100 mmol/L (ref 98–111)
Creatinine, Ser: 0.91 mg/dL (ref 0.61–1.24)
GFR, Estimated: >60 mL/min (ref >60)
Glucose, Bld: 152 mg/dL — ABNORMAL HIGH (ref 70–99)
Potassium: 3.7 mmol/L (ref 3.5–5.1)
Sodium: 134 mmol/L — ABNORMAL LOW (ref 135–145)

## 2023-01-24 LAB — CBC
HCT: 32.3 % — ABNORMAL LOW (ref 39.0–52.0)
Hemoglobin: 10.5 g/dL — ABNORMAL LOW (ref 13.0–17.0)
MCH: 29.5 pg (ref 26.0–34.0)
MCHC: 32.5 g/dL (ref 30.0–36.0)
MCV: 90.7 fL (ref 80.0–100.0)
Platelets: 165 10*3/uL (ref 150–400)
RBC: 3.56 MIL/uL — ABNORMAL LOW (ref 4.22–5.81)
RDW: 13.2 % (ref 11.5–15.5)
WBC: 8.1 10*3/uL (ref 4.0–10.5)
nRBC: 0 % (ref 0.0–0.2)

## 2023-01-24 LAB — MAGNESIUM: Magnesium: 1.8 mg/dL (ref 1.7–2.4)

## 2023-01-24 LAB — PROCALCITONIN: Procalcitonin: <0.1 ng/mL

## 2023-01-24 MED ORDER — ACETAMINOPHEN 325 MG PO TABS
650.0000 mg | ORAL_TABLET | Freq: Four times a day (QID) | ORAL | Status: DC | PRN
  Administered 2023-01-24 – 2023-01-29 (×2): 650 mg via ORAL
  Filled 2023-01-24 (×3): qty 2

## 2023-01-24 MED ORDER — VITAMIN C 500 MG PO TABS
500.0000 mg | ORAL_TABLET | Freq: Every day | ORAL | Status: DC
  Administered 2023-01-24 – 2023-01-29 (×6): 500 mg via ORAL
  Filled 2023-01-24 (×6): qty 1

## 2023-01-24 MED ORDER — GUAIFENESIN-DM 100-10 MG/5ML PO SYRP: 10.0000 mL | ORAL_SOLUTION | ORAL | Status: DC | PRN

## 2023-01-24 MED ORDER — NIRMATRELVIR/RITONAVIR (PAXLOVID)TABLET
3.0000 | ORAL_TABLET | Freq: Two times a day (BID) | ORAL | Status: AC
  Administered 2023-01-24 – 2023-01-28 (×10): 3 via ORAL
  Filled 2023-01-24: qty 30

## 2023-01-24 MED ORDER — SODIUM CHLORIDE 0.9 % IV BOLUS
250.0000 mL | Freq: Once | INTRAVENOUS | Status: AC
  Administered 2023-01-24: 250 mL via INTRAVENOUS

## 2023-01-24 MED ORDER — HYDROCOD POLI-CHLORPHE POLI ER 10-8 MG/5ML PO SUER: 5.0000 mL | Freq: Two times a day (BID) | ORAL | Status: DC | PRN

## 2023-01-24 MED ORDER — ZINC SULFATE 220 (50 ZN) MG PO CAPS
220.0000 mg | ORAL_CAPSULE | Freq: Every day | ORAL | Status: DC
  Administered 2023-01-24 – 2023-01-29 (×6): 220 mg via ORAL
  Filled 2023-01-24 (×6): qty 1

## 2023-01-24 MED ORDER — METHYLPREDNISOLONE SODIUM SUCC 40 MG IJ SOLR
40.0000 mg | Freq: Two times a day (BID) | INTRAMUSCULAR | Status: DC
  Administered 2023-01-24 – 2023-01-26 (×6): 40 mg via INTRAVENOUS
  Filled 2023-01-24 (×6): qty 1

## 2023-01-24 NOTE — Progress Notes (Signed)
   01/24/23 1408  Vitals  Temp (!) 100.5 F (38.1 C)  Temp Source Oral  BP 126/60  MAP (mmHg) 80  Pulse Rate 93  Resp (!) 28  Level of Consciousness  Level of Consciousness Alert  MEWS COLOR  MEWS Score Color Yellow  Oxygen Therapy  SpO2 98 %  O2 Device Nasal Cannula  O2 Flow Rate (L/min) 3 L/min  MEWS Score  MEWS Temp 1  MEWS Systolic 0  MEWS Pulse 0  MEWS RR 2  MEWS LOC 0  MEWS Score 3   MD Manuella Ghazi notified.

## 2023-01-24 NOTE — TOC Progression Note (Signed)
Transition of Care [facility]) - Progression Note    Patient Details  Name: Michael Evans MRN: [id number] Date of Birth: 07-10-1941  Transition of Care [facility]) CM/SW Contact  Boneta Lucks, RN Phone Number: 01/24/2023, 8:29 AM  Clinical Narrative:   Insurance he approved 2/23 - 2/27, next review 2/27 Candace Cruise id [id number] .  Patient tested COVID positive, [place] updated. TOC to follow on Monday.    Expected Discharge Plan: [place] Barriers to Discharge: Continued Medical Work up  Expected Discharge Plan and Services In-house Referral: Clinical Social Work   Post Acute Care Choice: [place] Living arrangements for the past 2 months: Single Family Home Expected Discharge Date: 01/23/23                   Social Determinants of Health (SDOH) Interventions SDOH Screenings   Food Insecurity: No Food Insecurity (01/20/2023)  Housing: Low Risk  (01/20/2023)  Transportation Needs: No Transportation Needs (01/20/2023)  Utilities: Not At Risk (01/20/2023)  Alcohol Screen: Low Risk  (02/07/2022)  Depression (PHQ2-9): Low Risk  (10/16/2022)  Financial Resource Strain: Low Risk  (02/07/2022)  Physical Activity: Insufficiently Active (02/07/2022)  Social Connections: Socially Isolated (02/07/2022)  Stress: No Stress Concern Present (02/07/2022)  Tobacco Use: Medium Risk (01/22/2023)    Readmission Risk Interventions    01/23/2023    1:13 PM  Readmission Risk Prevention Plan  Medication Screening Complete  Transportation Screening Complete

## 2023-01-24 NOTE — Progress Notes (Signed)
PROGRESS NOTE    Michael Evans  [id number] DOB: 26-Apr-1941 DOA: 01/20/2023 PCP: [name], MD   Brief Narrative:    82/M with history of COPD, chronic respiratory failure on 2 L home O2, hypertension, BPH, presented to the ED after a fall from 7 feet off a ladder, reportedly lost his balance and fell, did not hit his head, LOC no loss of consciousness, developed severe left knee pain, x-rays noted comminuted distracted patella fracture and blood pressure of 171/81.  Orthopedic surgery consulted and he underwent ORIF of left patellar fracture 2/22.  He now remains in a knee immobilizer and has been seen by PT/OT with recommendations for SNF placement.  He tested COVID-positive 2/23 and has been started on medications with Paxlovid and Solu-Medrol.  Assessment & Plan:   Principal Problem:   Comminuted fracture of left patella Active Problems:   Essential hypertension   COPD with emphysema (HCC)   Chronic respiratory failure with hypoxia (HCC)  Assessment and Plan:   Accidental fall off the ladder, 7 ft high-no head trauma or LOC.  No prodromes. Comminuted fracture of left patella due to fall. -Status post ORIF 2/22 -PT recommending SNF placement In knee immobilizers pending surgical repair. -Pain control and VTE prophylaxis with aspirin twice daily to start 2/23 -Bowel regimen  COVID infection -Currently with ongoing symptoms of cough and wheezing -Also noted to have bilateral infiltrates on chest x-ray -Start Paxlovid and maintain on airborne precautions -Start Solu-Medrol IV twice daily -SNF placement will be delayed   COPD with emphysema/chronic hypoxic RF on 2 L.  Stable. -Continue home regimen   BPH: Stable. -Resume Flomax and Proscar    DVT prophylaxis: Aspirin twice daily Code Status: Full Family Communication: None at bedside Disposition Plan: Placement to SNF Status is: Inpatient Remains inpatient appropriate because: Need for IV  medications  Consultants:  Orthopedics  Procedures:  Left patella ORIF 2/22  Antimicrobials:  Anti-infectives (From admission, onward)    Start     Dose/Rate Route Frequency Ordered Stop   01/24/23 1000  nirmatrelvir/ritonavir (PAXLOVID) 3 tablet        3 tablet Oral 2 times daily 01/24/23 0704 01/29/23 0959   01/22/23 2200  ceFAZolin (ANCEF) IVPB 2g/100 mL premix        2 g 200 mL/hr over 30 Minutes Intravenous Every 8 hours 01/22/23 1733 01/23/23 1436   01/22/23 1406  ceFAZolin (ANCEF) 2-4 GM/100ML-% IVPB       Note to Pharmacy: Abbie Sons S: cabinet override      01/22/23 1406 01/22/23 1443   01/22/23 1200  ceFAZolin (ANCEF) IVPB 2g/100 mL premix        2 g 200 mL/hr over 30 Minutes Intravenous On call to O.R. 01/21/23 1540 01/22/23 1445       Subjective: Patient seen and evaluated today with no new acute complaints or concerns. No acute concerns or events noted overnight.  He continues to have some mild intermittent pain.  Objective: Vitals:   01/24/23 0155 01/24/23 0558 01/24/23 0800 01/24/23 0838  BP: (!) 108/48 119/62 (!) 127/52   Pulse: 89 (!) 103 (!) 102   Resp:  20 20   Temp: 99.4 F (37.4 C) 100.2 F (37.9 C) 98.8 F (37.1 C)   TempSrc: Oral  Oral   SpO2:  96% 95% 95%  Weight:      Height:        Intake/Output Summary (Last 24 hours) at 01/24/2023 1051 Last data filed at 01/24/2023  [id number] Gross per 24 hour  Intake 593.08 ml  Output 925 ml  Net -331.92 ml   Filed Weights   01/22/23 1320 01/22/23 1746 01/23/23 0538  Weight: 57.1 kg 62.3 kg 64 kg    Examination:  General exam: Appears calm and comfortable  Respiratory system: Clear to auscultation. Respiratory effort normal.  3 L nasal cannula Cardiovascular system: S1 & S2 heard, RRR.  Gastrointestinal system: Abdomen is soft Central nervous system: Alert and awake Extremities: Left knee dressings C/D/I with knee immobilizer present Skin: No significant lesions noted Psychiatry: Flat  affect.    Data Reviewed: I have personally reviewed following labs and imaging studies  CBC: Recent Labs  Lab 01/20/23 1825 01/21/23 0500 01/22/23 0512 01/23/23 0514 01/24/23 0318  WBC 7.9 10.3 10.9* 13.0* 8.1  NEUTROABS 6.0  --   --   --   --   HGB 15.0 12.3* 11.6* 11.3* 10.5*  HCT 45.6 38.3* 35.9* 34.0* 32.3*  MCV 89.2 90.8 90.2 90.7 90.7  PLT 246 230 203 166 [id number]   Basic Metabolic Panel: Recent Labs  Lab 01/20/23 1825 01/21/23 0500 01/22/23 0512 01/23/23 0514 01/24/23 0318  NA 140 140 136 134* 134*  K 4.2 3.6 3.7 3.9 3.7  CL 103 106 103 101 100  CO2 '28 29 27 24 28  '$ GLUCOSE 118* 102* 135* 149* 152*  BUN 31* 26* '21 17 20  '$ CREATININE 0.96 0.97 0.89 0.84 0.91  CALCIUM 10.4* 9.3 8.7* 8.6* 8.2*  MG  --   --   --  1.9 1.8  PHOS  --   --   --  2.4*  --    GFR: Estimated Creatinine Clearance: 52.6 mL/min (by C-G formula based on SCr of 0.91 mg/dL). Liver Function Tests: Recent Labs  Lab 01/20/23 1825 01/23/23 0514  AST 28  --   ALT 20  --   ALKPHOS 62  --   BILITOT 0.4  --   PROT 7.0  --   ALBUMIN 3.9 2.7*   No results for input(s): "LIPASE", "AMYLASE" in the last 168 hours. No results for input(s): "AMMONIA" in the last 168 hours. Coagulation Profile: Recent Labs  Lab 01/20/23 1825  INR 0.9   Cardiac Enzymes: No results for input(s): "CKTOTAL", "CKMB", "CKMBINDEX", "TROPONINI" in the last 168 hours. BNP (last 3 results) No results for input(s): "PROBNP" in the last 8760 hours. HbA1C: No results for input(s): "HGBA1C" in the last 72 hours. CBG: Recent Labs  Lab 01/22/23 1822  GLUCAP 177*   Lipid Profile: No results for input(s): "CHOL", "HDL", "LDLCALC", "TRIG", "CHOLHDL", "LDLDIRECT" in the last 72 hours. Thyroid Function Tests: No results for input(s): "TSH", "T4TOTAL", "FREET4", "T3FREE", "THYROIDAB" in the last 72 hours. Anemia Panel: Recent Labs    01/23/23 0514  VITAMINB12 837  FOLATE 15.5  FERRITIN 72  TIBC 320  IRON 19*   RETICCTPCT 1.3   Sepsis Labs: Recent Labs  Lab 01/24/23 0318  PROCALCITON <0.10    Recent Results (from the past 240 hour(s))  Surgical PCR screen     Status: None   Collection Time: 01/22/23  1:02 AM   Specimen: Nasal Mucosa; Nasal Swab  Result Value Ref Range Status   MRSA, PCR NEGATIVE NEGATIVE Final   Staphylococcus aureus NEGATIVE NEGATIVE Final    Comment: (NOTE) The Xpert SA Assay (FDA approved for NASAL specimens in patients 58 years of age and older), is one component of a comprehensive surveillance program. It is not intended to diagnose infection nor  to guide or monitor treatment. Performed at [facility], [address], [place], [place] [address]   SARS Coronavirus 2 by RT PCR (hospital order, performed in [facility] hospital lab) *cepheid single result test* Anterior Nasal Swab     Status: Abnormal   Collection Time: 01/23/23  3:50 PM   Specimen: Anterior Nasal Swab  Result Value Ref Range Status   SARS Coronavirus 2 by RT PCR POSITIVE (A) NEGATIVE Final    Comment: (NOTE) SARS-CoV-2 target nucleic acids are DETECTED  SARS-CoV-2 RNA is generally detectable in upper respiratory specimens  during the acute phase of infection.  Positive results are indicative  of the presence of the identified virus, but do not rule out bacterial infection or co-infection with other pathogens not detected by the test.  Clinical correlation with patient history and  other diagnostic information is necessary to determine patient infection status.  The expected result is negative.  Fact Sheet for Patients:   https://www.patel.info/   Fact Sheet for Healthcare Providers:   https://hall.com/    This test is not yet approved or cleared by the [place] FDA and  has been authorized for detection and/or diagnosis of SARS-CoV-2 by FDA under an Emergency Use Authorization (EUA).  This EUA will remain in effect (meaning this test can be  used) for the duration of  the COVID-19 declaration under Section 564(b)(1)  of the Act, 21 U.S.C. section 360-bbb-3(b)(1), unless the authorization is terminated or revoked sooner.   Performed at [facility], [address], [place], [place] [address]          Radiology Studies: DG CHEST PORT 1 VIEW  Result Date: 01/24/2023 CLINICAL DATA:  COVID-19 EXAM: PORTABLE CHEST 1 VIEW COMPARISON:  01/20/2023 FINDINGS: Stable cardiomediastinal contours. Aortic atherosclerosis. Chronic emphysematous lung changes. Increasing interstitial markings in the perihilar and bibasilar regions. Additional bandlike bibasilar opacities, likely atelectasis. Possible trace right pleural effusion. No pneumothorax. IMPRESSION: Increasing interstitial markings in the perihilar and bibasilar regions, which may reflect developing atypical/viral infection. Electronically Signed   By: Davina Poke D.O.   On: 01/24/2023 09:18   DG Knee 1-2 Views Left  Result Date: 01/22/2023 CLINICAL DATA:  Postop after patellar fracture repair EXAM: LEFT KNEE - 1-2 VIEW COMPARISON:  Radiographs 01/20/2023 FINDINGS: Expected postoperative change after ORIF of the left patella fracture. Cutaneous staples. Mild adjacent edema. No radiographic evidence of hardware loosening. IMPRESSION: Expected postoperative change after ORIF of the left patella fracture. Electronically Signed   By: Placido Sou M.D.   On: 01/22/2023 17:39   DG Knee 1-2 Views Left  Result Date: 01/22/2023 CLINICAL DATA:  Fluoro guidance provided EXAM: LEFT KNEE - 1-2 VIEW FINDINGS: Dose: 2.9 mGy Fluoro time 41 s IMPRESSION: C-arm fluoro guidance provided. Electronically Signed   By: Sammie Bench M.D.   On: 01/22/2023 16:44   DG C-Arm 1-60 Min-No Report  Result Date: 01/22/2023 Fluoroscopy was utilized by the requesting physician.  No radiographic interpretation.        Scheduled Meds:  vitamin C  500 mg Oral Daily   aspirin EC  81 mg Oral BID    Chlorhexidine Gluconate Cloth  6 each Topical Daily   finasteride  5 mg Oral Daily   influenza vaccine adjuvanted  0.5 mL Intramuscular Tomorrow-1000   methylPREDNISolone (SOLU-MEDROL) injection  40 mg Intravenous Q12H   mometasone-formoterol  2 puff Inhalation BID   mupirocin ointment  1 Application Nasal BID   nirmatrelvir/ritonavir  3 tablet Oral BID  pneumococcal 20-valent conjugate vaccine  0.5 mL Intramuscular Tomorrow-1000   polyethylene glycol  17 g Oral BID   tamsulosin  0.8 mg Oral Daily   umeclidinium bromide  1 puff Inhalation Daily   zinc sulfate  220 mg Oral Daily      LOS: 4 days    Time spent: 35 minutes    Devlon Dosher Darleen Crocker, DO [facility]  If 7PM-7AM, please contact night-coverage [email or web address] 01/24/2023, 10:51 AM

## 2023-01-24 NOTE — Progress Notes (Signed)
   01/23/23 2338  Vitals  Temp 98.8 F (37.1 C)  BP (!) 90/37 (RN notified)  MAP (mmHg) (!) 54  BP Location Right Arm  BP Method Automatic  Patient Position (if appropriate) Lying  Pulse Rate 99  Resp 20  MEWS COLOR  MEWS Score Color Green  Oxygen Therapy  SpO2 93 %  O2 Device Nasal Cannula  O2 Flow Rate (L/min) 3 L/min  MEWS Score  MEWS Temp 0  MEWS Systolic 1  MEWS Pulse 0  MEWS RR 0  MEWS LOC 0  MEWS Score 1    Oncall provider MD Fuller Plan made aware of low BP. Received order for 250 NS Bolus. Will continue to monitor.

## 2023-01-25 LAB — CBC WITH DIFFERENTIAL/PLATELET
Abs Immature Granulocytes: 0.04 10*3/uL (ref 0.00–0.07)
Basophils Absolute: 0 10*3/uL (ref 0.0–0.1)
Basophils Relative: 0 %
Eosinophils Absolute: 0 10*3/uL (ref 0.0–0.5)
Eosinophils Relative: 0 %
HCT: 33.2 % — ABNORMAL LOW (ref 39.0–52.0)
Hemoglobin: 10.8 g/dL — ABNORMAL LOW (ref 13.0–17.0)
Immature Granulocytes: 1 %
Lymphocytes Relative: 14 %
Lymphs Abs: 0.7 10*3/uL (ref 0.7–4.0)
MCH: 29.1 pg (ref 26.0–34.0)
MCHC: 32.5 g/dL (ref 30.0–36.0)
MCV: 89.5 fL (ref 80.0–100.0)
Monocytes Absolute: 0.5 10*3/uL (ref 0.1–1.0)
Monocytes Relative: 10 %
Neutro Abs: 3.5 10*3/uL (ref 1.7–7.7)
Neutrophils Relative %: 75 %
Platelets: 190 10*3/uL (ref 150–400)
RBC: 3.71 MIL/uL — ABNORMAL LOW (ref 4.22–5.81)
RDW: 13.4 % (ref 11.5–15.5)
WBC: 4.7 10*3/uL (ref 4.0–10.5)
nRBC: 0 % (ref 0.0–0.2)

## 2023-01-25 LAB — COMPREHENSIVE METABOLIC PANEL
ALT: 17 U/L (ref 0–44)
AST: 39 U/L (ref 15–41)
Albumin: 2.7 g/dL — ABNORMAL LOW (ref 3.5–5.0)
Alkaline Phosphatase: 43 U/L (ref 38–126)
Anion gap: 7 (ref 5–15)
BUN: 21 mg/dL (ref 8–23)
CO2: 27 mmol/L (ref 22–32)
Calcium: 8.3 mg/dL — ABNORMAL LOW (ref 8.9–10.3)
Chloride: 103 mmol/L (ref 98–111)
Creatinine, Ser: 0.85 mg/dL (ref 0.61–1.24)
GFR, Estimated: >60 mL/min (ref >60)
Glucose, Bld: 204 mg/dL — ABNORMAL HIGH (ref 70–99)
Potassium: 3.9 mmol/L (ref 3.5–5.1)
Sodium: 137 mmol/L (ref 135–145)
Total Bilirubin: 0.8 mg/dL (ref 0.3–1.2)
Total Protein: 5.7 g/dL — ABNORMAL LOW (ref 6.5–8.1)

## 2023-01-25 LAB — MAGNESIUM: Magnesium: 2 mg/dL (ref 1.7–2.4)

## 2023-01-25 LAB — C-REACTIVE PROTEIN: CRP: 12.4 mg/dL — ABNORMAL HIGH (ref <1.0)

## 2023-01-25 LAB — D-DIMER, QUANTITATIVE: D-Dimer, Quant: 2.12 ug/mL-FEU — ABNORMAL HIGH (ref 0.00–0.50)

## 2023-01-25 LAB — GLUCOSE, CAPILLARY
Glucose-Capillary: 188 mg/dL — ABNORMAL HIGH (ref 70–99)
Glucose-Capillary: 244 mg/dL — ABNORMAL HIGH (ref 70–99)
Glucose-Capillary: 239 mg/dL — ABNORMAL HIGH (ref 70–99)
Glucose-Capillary: 184 mg/dL — ABNORMAL HIGH (ref 70–99)

## 2023-01-25 LAB — FERRITIN: Ferritin: 103 ng/mL (ref 24–336)

## 2023-01-25 MED ORDER — SODIUM CHLORIDE 0.9 % IV SOLN
250.0000 mg | Freq: Every day | INTRAVENOUS | Status: AC
  Administered 2023-01-25 – 2023-01-26 (×2): 250 mg via INTRAVENOUS
  Filled 2023-01-25 (×2): qty 250

## 2023-01-25 MED ORDER — INSULIN ASPART 100 UNIT/ML IJ SOLN
0.0000 [IU] | Freq: Three times a day (TID) | INTRAMUSCULAR | Status: DC
  Administered 2023-01-25 (×2): 3 [IU] via SUBCUTANEOUS
  Administered 2023-01-25: 2 [IU] via SUBCUTANEOUS
  Administered 2023-01-26: 5 [IU] via SUBCUTANEOUS

## 2023-01-25 MED ORDER — ENSURE ENLIVE PO LIQD: 237.0000 mL | Freq: Two times a day (BID) | ORAL | Status: DC

## 2023-01-25 MED ORDER — ENSURE ENLIVE PO LIQD
237.0000 mL | Freq: Three times a day (TID) | ORAL | Status: DC
  Administered 2023-01-25 – 2023-01-29 (×13): 237 mL via ORAL

## 2023-01-25 MED ORDER — INSULIN ASPART 100 UNIT/ML IJ SOLN: 0.0000 [IU] | Freq: Every day | INTRAMUSCULAR | Status: DC

## 2023-01-25 NOTE — Progress Notes (Signed)
PROGRESS NOTE    Michael Evans  [id number] DOB: Nov 20, 1941 DOA: 01/20/2023 PCP: [name], MD   Brief Narrative:    82/M with history of COPD, chronic respiratory failure on 2 L home O2, hypertension, BPH, presented to the ED after a fall from 7 feet off a ladder, reportedly lost his balance and fell, did not hit his head, LOC no loss of consciousness, developed severe left knee pain, x-rays noted comminuted distracted patella fracture and blood pressure of 171/81.  Orthopedic surgery consulted and he underwent ORIF of left patellar fracture 2/22.  He now remains in a knee immobilizer and has been seen by PT/OT with recommendations for SNF placement.  He tested COVID-positive 2/23 and has been started on medications with Paxlovid and Solu-Medrol.  Assessment & Plan:   Principal Problem:   Comminuted fracture of left patella Active Problems:   Essential hypertension   COPD with emphysema (HCC)   Chronic respiratory failure with hypoxia (HCC)  Assessment and Plan:   Accidental fall off the ladder, 7 ft high-no head trauma or LOC.  No prodromes. Comminuted fracture of left patella due to fall. -Status post ORIF 2/22 -PT recommending SNF placement In knee immobilizers pending surgical repair. -Pain control and VTE prophylaxis with aspirin twice daily started 2/23 -Bowel regimen  COVID infection -Currently with ongoing symptoms of cough and wheezing -Also noted to have bilateral infiltrates on chest x-ray -Start Paxlovid and maintain on airborne precautions -Start Solu-Medrol IV twice daily -SNF placement will be delayed  Iron deficiency anemia -Plan to give IV iron starting 2/25  Mild hyperglycemia-steroid-induced -Coverage with SSI   COPD with emphysema/chronic hypoxic RF on 2 L.  Stable. -Continue home regimen   BPH: Stable. -Resume Flomax and Proscar    DVT prophylaxis: Aspirin twice daily Code Status: Full Family Communication: None at  bedside Disposition Plan: Placement to SNF Status is: Inpatient Remains inpatient appropriate because: Need for IV medications  Consultants:  Orthopedics  Procedures:  Left patella ORIF 2/22  Antimicrobials:  Anti-infectives (From admission, onward)    Start     Dose/Rate Route Frequency Ordered Stop   01/24/23 1000  nirmatrelvir/ritonavir (PAXLOVID) 3 tablet        3 tablet Oral 2 times daily 01/24/23 0704 01/29/23 0959   01/22/23 2200  ceFAZolin (ANCEF) IVPB 2g/100 mL premix        2 g 200 mL/hr over 30 Minutes Intravenous Every 8 hours 01/22/23 1733 01/23/23 1436   01/22/23 1406  ceFAZolin (ANCEF) 2-4 GM/100ML-% IVPB       Note to Pharmacy: Abbie Sons S: cabinet override      01/22/23 1406 01/22/23 1443   01/22/23 1200  ceFAZolin (ANCEF) IVPB 2g/100 mL premix        2 g 200 mL/hr over 30 Minutes Intravenous On call to O.R. 01/21/23 1540 01/22/23 1445       Subjective: Patient seen and evaluated today with no new acute complaints or concerns. No acute concerns or events noted overnight.  He continues to have some mild intermittent pain.  Objective: Vitals:   01/24/23 2007 01/25/23 0213 01/25/23 0532 01/25/23 0857  BP: (!) 118/58 122/64 136/79   Pulse: 80 69 67   Resp: (!) 22 (!) 22 20   Temp: 98 F (36.7 C) 98.4 F (36.9 C) 98.2 F (36.8 C)   TempSrc: Oral Oral Oral   SpO2: 95% 95% 98% 98%  Weight:      Height:  Intake/Output Summary (Last 24 hours) at 01/25/2023 1002 Last data filed at 01/25/2023 0606 Gross per 24 hour  Intake 240 ml  Output 950 ml  Net -710 ml   Filed Weights   01/22/23 1320 01/22/23 1746 01/23/23 0538  Weight: 57.1 kg 62.3 kg 64 kg    Examination:  General exam: Appears calm and comfortable  Respiratory system: Clear to auscultation. Respiratory effort normal.  3 L nasal cannula Cardiovascular system: S1 & S2 heard, RRR.  Gastrointestinal system: Abdomen is soft Central nervous system: Alert and awake Extremities: Left  knee dressings C/D/I with knee immobilizer present Skin: No significant lesions noted Psychiatry: Flat affect.    Data Reviewed: I have personally reviewed following labs and imaging studies  CBC: Recent Labs  Lab 01/20/23 1825 01/21/23 0500 01/22/23 0512 01/23/23 0514 01/24/23 0318 01/25/23 0547  WBC 7.9 10.3 10.9* 13.0* 8.1 4.7  NEUTROABS 6.0  --   --   --   --  3.5  HGB 15.0 12.3* 11.6* 11.3* 10.5* 10.8*  HCT 45.6 38.3* 35.9* 34.0* 32.3* 33.2*  MCV 89.2 90.8 90.2 90.7 90.7 89.5  PLT 246 230 203 166 165 [id number]   Basic Metabolic Panel: Recent Labs  Lab 01/21/23 0500 01/22/23 0512 01/23/23 0514 01/24/23 0318 01/25/23 0547  NA 140 136 134* 134* 137  K 3.6 3.7 3.9 3.7 3.9  CL 106 103 101 100 103  CO2 '29 27 24 28 27  '$ GLUCOSE 102* 135* 149* 152* 204*  BUN 26* '21 17 20 21  '$ CREATININE 0.97 0.89 0.84 0.91 0.85  CALCIUM 9.3 8.7* 8.6* 8.2* 8.3*  MG  --   --  1.9 1.8 2.0  PHOS  --   --  2.4*  --   --    GFR: Estimated Creatinine Clearance: 56.3 mL/min (by C-G formula based on SCr of 0.85 mg/dL). Liver Function Tests: Recent Labs  Lab 01/20/23 1825 01/23/23 0514 01/25/23 0547  AST 28  --  39  ALT 20  --  17  ALKPHOS 62  --  43  BILITOT 0.4  --  0.8  PROT 7.0  --  5.7*  ALBUMIN 3.9 2.7* 2.7*   No results for input(s): "LIPASE", "AMYLASE" in the last 168 hours. No results for input(s): "AMMONIA" in the last 168 hours. Coagulation Profile: Recent Labs  Lab 01/20/23 1825  INR 0.9   Cardiac Enzymes: No results for input(s): "CKTOTAL", "CKMB", "CKMBINDEX", "TROPONINI" in the last 168 hours. BNP (last 3 results) No results for input(s): "PROBNP" in the last 8760 hours. HbA1C: No results for input(s): "HGBA1C" in the last 72 hours. CBG: Recent Labs  Lab 01/22/23 1822 01/25/23 0747  GLUCAP 177* 188*   Lipid Profile: No results for input(s): "CHOL", "HDL", "LDLCALC", "TRIG", "CHOLHDL", "LDLDIRECT" in the last 72 hours. Thyroid Function Tests: No results for  input(s): "TSH", "T4TOTAL", "FREET4", "T3FREE", "THYROIDAB" in the last 72 hours. Anemia Panel: Recent Labs    01/23/23 0514 01/25/23 0547  VITAMINB12 837  --   FOLATE 15.5  --   FERRITIN 72 103  TIBC 320  --   IRON 19*  --   RETICCTPCT 1.3  --    Sepsis Labs: Recent Labs  Lab 01/24/23 0318  PROCALCITON <0.10    Recent Results (from the past 240 hour(s))  Surgical PCR screen     Status: None   Collection Time: 01/22/23  1:02 AM   Specimen: Nasal Mucosa; Nasal Swab  Result Value Ref Range Status   MRSA, PCR  NEGATIVE NEGATIVE Final   Staphylococcus aureus NEGATIVE NEGATIVE Final    Comment: (NOTE) The Xpert SA Assay (FDA approved for NASAL specimens in patients 76 years of age and older), is one component of a comprehensive surveillance program. It is not intended to diagnose infection nor to guide or monitor treatment. Performed at [facility], [address], [place], [place] [address]   SARS Coronavirus 2 by RT PCR (hospital order, performed in [facility] hospital lab) *cepheid single result test* Anterior Nasal Swab     Status: Abnormal   Collection Time: 01/23/23  3:50 PM   Specimen: Anterior Nasal Swab  Result Value Ref Range Status   SARS Coronavirus 2 by RT PCR POSITIVE (A) NEGATIVE Final    Comment: (NOTE) SARS-CoV-2 target nucleic acids are DETECTED  SARS-CoV-2 RNA is generally detectable in upper respiratory specimens  during the acute phase of infection.  Positive results are indicative  of the presence of the identified virus, but do not rule out bacterial infection or co-infection with other pathogens not detected by the test.  Clinical correlation with patient history and  other diagnostic information is necessary to determine patient infection status.  The expected result is negative.  Fact Sheet for Patients:   https://www.patel.info/   Fact Sheet for Healthcare Providers:   https://hall.com/    This  test is not yet approved or cleared by the [place] FDA and  has been authorized for detection and/or diagnosis of SARS-CoV-2 by FDA under an Emergency Use Authorization (EUA).  This EUA will remain in effect (meaning this test can be used) for the duration of  the COVID-19 declaration under Section 564(b)(1)  of the Act, 21 U.S.C. section 360-bbb-3(b)(1), unless the authorization is terminated or revoked sooner.   Performed at [facility], [address] [place], [place] [address]          Radiology Studies: DG CHEST PORT 1 VIEW  Result Date: 01/24/2023 CLINICAL DATA:  COVID-19 EXAM: PORTABLE CHEST 1 VIEW COMPARISON:  01/20/2023 FINDINGS: Stable cardiomediastinal contours. Aortic atherosclerosis. Chronic emphysematous lung changes. Increasing interstitial markings in the perihilar and bibasilar regions. Additional bandlike bibasilar opacities, likely atelectasis. Possible trace right pleural effusion. No pneumothorax. IMPRESSION: Increasing interstitial markings in the perihilar and bibasilar regions, which may reflect developing atypical/viral infection. Electronically Signed   By: Davina Poke D.O.   On: 01/24/2023 09:18        Scheduled Meds:  vitamin C  500 mg Oral Daily   aspirin EC  81 mg Oral BID   Chlorhexidine Gluconate Cloth  6 each Topical Daily   feeding supplement  237 mL Oral TID BM   finasteride  5 mg Oral Daily   influenza vaccine adjuvanted  0.5 mL Intramuscular Tomorrow-1000   insulin aspart  0-5 Units Subcutaneous QHS   insulin aspart  0-9 Units Subcutaneous TID WC   methylPREDNISolone (SOLU-MEDROL) injection  40 mg Intravenous BID   mometasone-formoterol  2 puff Inhalation BID   mupirocin ointment  1 Application Nasal BID   nirmatrelvir/ritonavir  3 tablet Oral BID   pneumococcal 20-valent conjugate vaccine  0.5 mL Intramuscular Tomorrow-1000   polyethylene glycol  17 g Oral BID   tamsulosin  0.8 mg Oral Daily   umeclidinium bromide  1 puff  Inhalation Daily   zinc sulfate  220 mg Oral Daily      LOS: 5 days    Time spent: 35 minutes    Jonita Hirota Darleen Crocker, DO [facility]  If 7PM-7AM,  please contact night-coverage [email or web address] 01/25/2023, 10:02 AM

## 2023-01-25 NOTE — Progress Notes (Signed)
Patient alert and verbal. Tolerated medications whole. Ensure given per MD order. Patient verbalized no complaints of pain to left knee. Knee immobilizer in place. Patient still has cough, non-productive.

## 2023-01-26 LAB — CBC WITH DIFFERENTIAL/PLATELET
Abs Immature Granulocytes: 0.06 10*3/uL (ref 0.00–0.07)
Basophils Absolute: 0 10*3/uL (ref 0.0–0.1)
Basophils Relative: 0 %
Eosinophils Absolute: 0 10*3/uL (ref 0.0–0.5)
Eosinophils Relative: 0 %
HCT: 33.4 % — ABNORMAL LOW (ref 39.0–52.0)
Hemoglobin: 11 g/dL — ABNORMAL LOW (ref 13.0–17.0)
Immature Granulocytes: 1 %
Lymphocytes Relative: 8 %
Lymphs Abs: 0.8 10*3/uL (ref 0.7–4.0)
MCH: 29.3 pg (ref 26.0–34.0)
MCHC: 32.9 g/dL (ref 30.0–36.0)
MCV: 89.1 fL (ref 80.0–100.0)
Monocytes Absolute: 1 10*3/uL (ref 0.1–1.0)
Monocytes Relative: 10 %
Neutro Abs: 8 10*3/uL — ABNORMAL HIGH (ref 1.7–7.7)
Neutrophils Relative %: 81 %
Platelets: 206 10*3/uL (ref 150–400)
RBC: 3.75 MIL/uL — ABNORMAL LOW (ref 4.22–5.81)
RDW: 13.3 % (ref 11.5–15.5)
WBC: 9.8 10*3/uL (ref 4.0–10.5)
nRBC: 0 % (ref 0.0–0.2)

## 2023-01-26 LAB — MAGNESIUM: Magnesium: 2 mg/dL (ref 1.7–2.4)

## 2023-01-26 LAB — COMPREHENSIVE METABOLIC PANEL
ALT: 21 U/L (ref 0–44)
AST: 39 U/L (ref 15–41)
Albumin: 2.6 g/dL — ABNORMAL LOW (ref 3.5–5.0)
Alkaline Phosphatase: 41 U/L (ref 38–126)
Anion gap: 9 (ref 5–15)
BUN: 27 mg/dL — ABNORMAL HIGH (ref 8–23)
CO2: 26 mmol/L (ref 22–32)
Calcium: 8.4 mg/dL — ABNORMAL LOW (ref 8.9–10.3)
Chloride: 101 mmol/L (ref 98–111)
Creatinine, Ser: 0.82 mg/dL (ref 0.61–1.24)
GFR, Estimated: >60 mL/min (ref >60)
Glucose, Bld: 230 mg/dL — ABNORMAL HIGH (ref 70–99)
Potassium: 3.7 mmol/L (ref 3.5–5.1)
Sodium: 136 mmol/L (ref 135–145)
Total Bilirubin: 0.6 mg/dL (ref 0.3–1.2)
Total Protein: 5.8 g/dL — ABNORMAL LOW (ref 6.5–8.1)

## 2023-01-26 LAB — GLUCOSE, CAPILLARY
Glucose-Capillary: 251 mg/dL — ABNORMAL HIGH (ref 70–99)
Glucose-Capillary: 302 mg/dL — ABNORMAL HIGH (ref 70–99)
Glucose-Capillary: 260 mg/dL — ABNORMAL HIGH (ref 70–99)
Glucose-Capillary: 259 mg/dL — ABNORMAL HIGH (ref 70–99)

## 2023-01-26 LAB — D-DIMER, QUANTITATIVE: D-Dimer, Quant: 1.98 ug/mL-FEU — ABNORMAL HIGH (ref 0.00–0.50)

## 2023-01-26 LAB — C-REACTIVE PROTEIN: CRP: 6.2 mg/dL — ABNORMAL HIGH (ref <1.0)

## 2023-01-26 LAB — HEMOGLOBIN A1C
Hgb A1c MFr Bld: 6.8 % — ABNORMAL HIGH (ref 4.8–5.6)
Mean Plasma Glucose: 148 mg/dL

## 2023-01-26 LAB — FERRITIN: Ferritin: 252 ng/mL (ref 24–336)

## 2023-01-26 MED ORDER — INSULIN ASPART 100 UNIT/ML IJ SOLN
0.0000 [IU] | Freq: Three times a day (TID) | INTRAMUSCULAR | Status: DC
  Administered 2023-01-26: 8 [IU] via SUBCUTANEOUS
  Administered 2023-01-26: 11 [IU] via SUBCUTANEOUS
  Administered 2023-01-27: 2 [IU] via SUBCUTANEOUS
  Administered 2023-01-27: 5 [IU] via SUBCUTANEOUS
  Administered 2023-01-27: 8 [IU] via SUBCUTANEOUS
  Administered 2023-01-28 (×3): 5 [IU] via SUBCUTANEOUS

## 2023-01-26 MED ORDER — INSULIN ASPART 100 UNIT/ML IJ SOLN
3.0000 [IU] | Freq: Three times a day (TID) | INTRAMUSCULAR | Status: DC
  Administered 2023-01-26 – 2023-01-28 (×7): 3 [IU] via SUBCUTANEOUS

## 2023-01-26 MED ORDER — INSULIN ASPART 100 UNIT/ML IJ SOLN
0.0000 [IU] | Freq: Every day | INTRAMUSCULAR | Status: DC
  Administered 2023-01-26: 3 [IU] via SUBCUTANEOUS
  Administered 2023-01-27: 2 [IU] via SUBCUTANEOUS

## 2023-01-26 NOTE — Progress Notes (Signed)
Patient alert and rested well.  Bath given. No complaints of pain from patient, vitals stable. No acute events overnight.

## 2023-01-26 NOTE — TOC Progression Note (Signed)
Transition of Care [facility]) - Progression Note    Patient Details  Name: Michael Evans MRN: [id number] Date of Birth: 12/31/1940  Transition of [facility]) CM/SW Contact  Salome Arnt,  Phone Number: 01/26/2023, 11:01 AM  Clinical Narrative:  Pt tested positive for COVID on 2/23. LCSW confirmed with [facility] that pt can admit on Thursday 2/29. MD and pt's brother, [name] updated. TOC will start auth closer to d/c.      Expected Discharge Plan: [place] Barriers to Discharge: Continued Medical Work up  Expected Discharge Plan and Services In-house Referral: Clinical Social Work   Post Acute Care Choice: [place] Living arrangements for the past 2 months: Single Family Home Expected Discharge Date: 01/23/23                                     Social Determinants of Health (SDOH) Interventions SDOH Screenings   Food Insecurity: No Food Insecurity (01/20/2023)  Housing: Low Risk  (01/20/2023)  Transportation Needs: No Transportation Needs (01/20/2023)  Utilities: Not At Risk (01/20/2023)  Alcohol Screen: Low Risk  (02/07/2022)  Depression (PHQ2-9): Low Risk  (10/16/2022)  Financial Resource Strain: Low Risk  (02/07/2022)  Physical Activity: Insufficiently Active (02/07/2022)  Social Connections: Socially Isolated (02/07/2022)  Stress: No Stress Concern Present (02/07/2022)  Tobacco Use: Medium Risk (01/22/2023)    Readmission Risk Interventions    01/23/2023    1:13 PM  Readmission Risk Prevention Plan  Medication Screening Complete  Transportation Screening Complete

## 2023-01-26 NOTE — Progress Notes (Signed)
PROGRESS NOTE    Michael Evans  [id number] DOB: 1941/02/01 DOA: 01/20/2023 PCP: [name], MD   Brief Narrative:    82/M with history of COPD, chronic respiratory failure on 2 L home O2, hypertension, BPH, presented to the ED after a fall from 7 feet off a ladder, reportedly lost his balance and fell, did not hit his head, LOC no loss of consciousness, developed severe left knee pain, x-rays noted comminuted distracted patella fracture and blood pressure of 171/81.  Orthopedic surgery consulted and he underwent ORIF of left patellar fracture 2/22.  He now remains in a knee immobilizer and has been seen by PT/OT with recommendations for SNF placement.  He tested COVID-positive 2/23 and has been started on medications with Paxlovid and Solu-Medrol.  Assessment & Plan:   Principal Problem:   Comminuted fracture of left patella Active Problems:   Essential hypertension   COPD with emphysema (HCC)   Chronic respiratory failure with hypoxia (HCC)  Assessment and Plan:   Accidental fall off the ladder, 7 ft high-no head trauma or LOC.  No prodromes. Comminuted fracture of left patella due to fall. -Status post ORIF 2/22 -PT recommending SNF placement In knee immobilizers pending surgical repair. -Pain control and VTE prophylaxis with aspirin twice daily started 2/23 -Bowel regimen  COVID infection -Currently with ongoing symptoms of cough and wheezing -Also noted to have bilateral infiltrates on chest x-ray -Start Paxlovid and maintain on airborne precautions -Continue Solu-Medrol IV twice daily -SNF placement will be delayed  Iron deficiency anemia -Plan to give IV iron starting 2/25  Mild hyperglycemia-steroid-induced -Coverage with SSI, increased on 2/26 with mealtime insulin added   COPD with emphysema/chronic hypoxic RF on 2 L.  Stable. -Continue home regimen   BPH: Stable. -Resume Flomax and Proscar    DVT prophylaxis: Aspirin twice daily Code Status:  Full Family Communication: None at bedside Disposition Plan: Placement to SNF Status is: Inpatient Remains inpatient appropriate because: Need for IV medications  Consultants:  Orthopedics  Procedures:  Left patella ORIF 2/22  Antimicrobials:  Anti-infectives (From admission, onward)    Start     Dose/Rate Route Frequency Ordered Stop   01/24/23 1000  nirmatrelvir/ritonavir (PAXLOVID) 3 tablet        3 tablet Oral 2 times daily 01/24/23 0704 01/29/23 0959   01/22/23 2200  ceFAZolin (ANCEF) IVPB 2g/100 mL premix        2 g 200 mL/hr over 30 Minutes Intravenous Every 8 hours 01/22/23 1733 01/23/23 1436   01/22/23 1406  ceFAZolin (ANCEF) 2-4 GM/100ML-% IVPB       Note to Pharmacy: Abbie Sons S: cabinet override      01/22/23 1406 01/22/23 1443   01/22/23 1200  ceFAZolin (ANCEF) IVPB 2g/100 mL premix        2 g 200 mL/hr over 30 Minutes Intravenous On call to O.R. 01/21/23 1540 01/22/23 1445       Subjective: Patient seen and evaluated today with no new acute complaints or concerns. No acute concerns or events noted overnight.  He continues to have some mild intermittent pain.  Objective: Vitals:   01/25/23 2033 01/25/23 2036 01/26/23 0342 01/26/23 0801  BP: 114/63  (!) 127/57   Pulse: 76 78 (!) 55   Resp: '18 18 18   '$ Temp: 98.1 F (36.7 C)  97.6 F (36.4 C)   TempSrc:      SpO2: 93% 96% 99% 99%  Weight:      Height:  Intake/Output Summary (Last 24 hours) at 01/26/2023 0919 Last data filed at 01/26/2023 [id number] Gross per 24 hour  Intake 482.35 ml  Output 1700 ml  Net -1217.65 ml   Filed Weights   01/22/23 1320 01/22/23 1746 01/23/23 0538  Weight: 57.1 kg 62.3 kg 64 kg    Examination:  General exam: Appears calm and comfortable  Respiratory system: Clear to auscultation. Respiratory effort normal.  3 L nasal cannula Cardiovascular system: S1 & S2 heard, RRR.  Gastrointestinal system: Abdomen is soft Central nervous system: Alert and  awake Extremities: Left knee dressings C/D/I with knee immobilizer present Skin: No significant lesions noted Psychiatry: Flat affect.    Data Reviewed: I have personally reviewed following labs and imaging studies  CBC: Recent Labs  Lab 01/20/23 1825 01/21/23 0500 01/22/23 0512 01/23/23 0514 01/24/23 0318 01/25/23 0547 01/26/23 0511  WBC 7.9   < > 10.9* 13.0* 8.1 4.7 9.8  NEUTROABS 6.0  --   --   --   --  3.5 8.0*  HGB 15.0   < > 11.6* 11.3* 10.5* 10.8* 11.0*  HCT 45.6   < > 35.9* 34.0* 32.3* 33.2* 33.4*  MCV 89.2   < > 90.2 90.7 90.7 89.5 89.1  PLT 246   < > 203 166 165 190 206   < > = values in this interval not displayed.   Basic Metabolic Panel: Recent Labs  Lab 01/22/23 0512 01/23/23 0514 01/24/23 0318 01/25/23 0547 01/26/23 0511  NA 136 134* 134* 137 136  K 3.7 3.9 3.7 3.9 3.7  CL 103 101 100 103 101  CO2 '27 24 28 27 26  '$ GLUCOSE 135* 149* 152* 204* 230*  BUN '21 17 20 21 '$ 27*  CREATININE 0.89 0.84 0.91 0.85 0.82  CALCIUM 8.7* 8.6* 8.2* 8.3* 8.4*  MG  --  1.9 1.8 2.0 2.0  PHOS  --  2.4*  --   --   --    GFR: Estimated Creatinine Clearance: 58.3 mL/min (by C-G formula based on SCr of 0.82 mg/dL). Liver Function Tests: Recent Labs  Lab 01/20/23 1825 01/23/23 0514 01/25/23 0547 01/26/23 0511  AST 28  --  39 39  ALT 20  --  17 21  ALKPHOS 62  --  43 41  BILITOT 0.4  --  0.8 0.6  PROT 7.0  --  5.7* 5.8*  ALBUMIN 3.9 2.7* 2.7* 2.6*   No results for input(s): "LIPASE", "AMYLASE" in the last 168 hours. No results for input(s): "AMMONIA" in the last 168 hours. Coagulation Profile: Recent Labs  Lab 01/20/23 1825  INR 0.9   Cardiac Enzymes: No results for input(s): "CKTOTAL", "CKMB", "CKMBINDEX", "TROPONINI" in the last 168 hours. BNP (last 3 results) No results for input(s): "PROBNP" in the last 8760 hours. HbA1C: No results for input(s): "HGBA1C" in the last 72 hours. CBG: Recent Labs  Lab 01/25/23 0747 01/25/23 1136 01/25/23 1715  01/25/23 2034 01/26/23 0822  GLUCAP 188* 244* 239* 184* 251*   Lipid Profile: No results for input(s): "CHOL", "HDL", "LDLCALC", "TRIG", "CHOLHDL", "LDLDIRECT" in the last 72 hours. Thyroid Function Tests: No results for input(s): "TSH", "T4TOTAL", "FREET4", "T3FREE", "THYROIDAB" in the last 72 hours. Anemia Panel: Recent Labs    01/25/23 0547 01/26/23 0511  FERRITIN 103 252   Sepsis Labs: Recent Labs  Lab 01/24/23 0318  PROCALCITON <0.10    Recent Results (from the past 240 hour(s))  Surgical PCR screen     Status: None   Collection Time: 01/22/23  1:02 AM   Specimen: Nasal Mucosa; Nasal Swab  Result Value Ref Range Status   MRSA, PCR NEGATIVE NEGATIVE Final   Staphylococcus aureus NEGATIVE NEGATIVE Final    Comment: (NOTE) The Xpert SA Assay (FDA approved for NASAL specimens in patients 74 years of age and older), is one component of a comprehensive surveillance program. It is not intended to diagnose infection nor to guide or monitor treatment. Performed at [facility], [address] [place], [place] [address]   SARS Coronavirus 2 by RT PCR (hospital order, performed in [facility] hospital lab) *cepheid single result test* Anterior Nasal Swab     Status: Abnormal   Collection Time: 01/23/23  3:50 PM   Specimen: Anterior Nasal Swab  Result Value Ref Range Status   SARS Coronavirus 2 by RT PCR POSITIVE (A) NEGATIVE Final    Comment: (NOTE) SARS-CoV-2 target nucleic acids are DETECTED  SARS-CoV-2 RNA is generally detectable in upper respiratory specimens  during the acute phase of infection.  Positive results are indicative  of the presence of the identified virus, but do not rule out bacterial infection or co-infection with other pathogens not detected by the test.  Clinical correlation with patient history and  other diagnostic information is necessary to determine patient infection status.  The expected result is negative.  Fact Sheet for Patients:    https://www.patel.info/   Fact Sheet for Healthcare Providers:   https://hall.com/    This test is not yet approved or cleared by the [place] FDA and  has been authorized for detection and/or diagnosis of SARS-CoV-2 by FDA under an Emergency Use Authorization (EUA).  This EUA will remain in effect (meaning this test can be used) for the duration of  the COVID-19 declaration under Section 564(b)(1)  of the Act, 21 U.S.C. section 360-bbb-3(b)(1), unless the authorization is terminated or revoked sooner.   Performed at [facility], [address] [place], [place] [address]          Radiology Studies: No results found.      Scheduled Meds:  vitamin C  500 mg Oral Daily   aspirin EC  81 mg Oral BID   Chlorhexidine Gluconate Cloth  6 each Topical Daily   feeding supplement  237 mL Oral TID BM   finasteride  5 mg Oral Daily   influenza vaccine adjuvanted  0.5 mL Intramuscular Tomorrow-1000   insulin aspart  0-15 Units Subcutaneous TID WC   insulin aspart  0-5 Units Subcutaneous QHS   insulin aspart  3 Units Subcutaneous TID WC   methylPREDNISolone (SOLU-MEDROL) injection  40 mg Intravenous BID   mometasone-formoterol  2 puff Inhalation BID   mupirocin ointment  1 Application Nasal BID   nirmatrelvir/ritonavir  3 tablet Oral BID   pneumococcal 20-valent conjugate vaccine  0.5 mL Intramuscular Tomorrow-1000   polyethylene glycol  17 g Oral BID   tamsulosin  0.8 mg Oral Daily   umeclidinium bromide  1 puff Inhalation Daily   zinc sulfate  220 mg Oral Daily      LOS: 6 days    Time spent: 35 minutes    Danilynn Jemison Darleen Crocker, DO [facility]  If 7PM-7AM, please contact night-coverage [email or web address] 01/26/2023, 9:19 AM

## 2023-01-26 NOTE — Plan of Care (Signed)
  Problem: Education: Goal: Knowledge of General Education information will improve Description Including pain rating scale, medication(s)/side effects and non-pharmacologic comfort measures Outcome: Progressing   

## 2023-01-27 LAB — CBC WITH DIFFERENTIAL/PLATELET
Abs Immature Granulocytes: 0.12 10*3/uL — ABNORMAL HIGH (ref 0.00–0.07)
Basophils Absolute: 0 10*3/uL (ref 0.0–0.1)
Basophils Relative: 0 %
Eosinophils Absolute: 0 10*3/uL (ref 0.0–0.5)
Eosinophils Relative: 0 %
HCT: 34.4 % — ABNORMAL LOW (ref 39.0–52.0)
Hemoglobin: 11.2 g/dL — ABNORMAL LOW (ref 13.0–17.0)
Immature Granulocytes: 1 %
Lymphocytes Relative: 7 %
Lymphs Abs: 0.8 10*3/uL (ref 0.7–4.0)
MCH: 28.9 pg (ref 26.0–34.0)
MCHC: 32.6 g/dL (ref 30.0–36.0)
MCV: 88.9 fL (ref 80.0–100.0)
Monocytes Absolute: 0.6 10*3/uL (ref 0.1–1.0)
Monocytes Relative: 6 %
Neutro Abs: 9.5 10*3/uL — ABNORMAL HIGH (ref 1.7–7.7)
Neutrophils Relative %: 86 %
Platelets: 215 10*3/uL (ref 150–400)
RBC: 3.87 MIL/uL — ABNORMAL LOW (ref 4.22–5.81)
RDW: 13.4 % (ref 11.5–15.5)
WBC: 11 10*3/uL — ABNORMAL HIGH (ref 4.0–10.5)
nRBC: 0 % (ref 0.0–0.2)

## 2023-01-27 LAB — COMPREHENSIVE METABOLIC PANEL
ALT: 22 U/L (ref 0–44)
AST: 39 U/L (ref 15–41)
Albumin: 2.5 g/dL — ABNORMAL LOW (ref 3.5–5.0)
Alkaline Phosphatase: 36 U/L — ABNORMAL LOW (ref 38–126)
Anion gap: 9 (ref 5–15)
BUN: 24 mg/dL — ABNORMAL HIGH (ref 8–23)
CO2: 28 mmol/L (ref 22–32)
Calcium: 8.9 mg/dL (ref 8.9–10.3)
Chloride: 101 mmol/L (ref 98–111)
Creatinine, Ser: 0.78 mg/dL (ref 0.61–1.24)
GFR, Estimated: >60 mL/min (ref >60)
Glucose, Bld: 169 mg/dL — ABNORMAL HIGH (ref 70–99)
Potassium: 3.4 mmol/L — ABNORMAL LOW (ref 3.5–5.1)
Sodium: 138 mmol/L (ref 135–145)
Total Bilirubin: 0.3 mg/dL (ref 0.3–1.2)
Total Protein: 5.6 g/dL — ABNORMAL LOW (ref 6.5–8.1)

## 2023-01-27 LAB — FERRITIN: Ferritin: 408 ng/mL — ABNORMAL HIGH (ref 24–336)

## 2023-01-27 LAB — GLUCOSE, CAPILLARY
Glucose-Capillary: 223 mg/dL — ABNORMAL HIGH (ref 70–99)
Glucose-Capillary: 254 mg/dL — ABNORMAL HIGH (ref 70–99)
Glucose-Capillary: 150 mg/dL — ABNORMAL HIGH (ref 70–99)
Glucose-Capillary: 202 mg/dL — ABNORMAL HIGH (ref 70–99)

## 2023-01-27 LAB — D-DIMER, QUANTITATIVE: D-Dimer, Quant: 1.69 ug/mL-FEU — ABNORMAL HIGH (ref 0.00–0.50)

## 2023-01-27 LAB — MAGNESIUM: Magnesium: 1.9 mg/dL (ref 1.7–2.4)

## 2023-01-27 LAB — C-REACTIVE PROTEIN: CRP: 3 mg/dL — ABNORMAL HIGH (ref <1.0)

## 2023-01-27 MED ORDER — PREDNISONE 20 MG PO TABS
40.0000 mg | ORAL_TABLET | Freq: Every day | ORAL | Status: DC
  Administered 2023-01-27 – 2023-01-28 (×2): 40 mg via ORAL
  Filled 2023-01-27 (×3): qty 2

## 2023-01-27 MED ORDER — PANTOPRAZOLE SODIUM 40 MG PO TBEC
40.0000 mg | DELAYED_RELEASE_TABLET | Freq: Every day | ORAL | Status: DC
  Administered 2023-01-27 – 2023-01-29 (×3): 40 mg via ORAL
  Filled 2023-01-27 (×3): qty 1

## 2023-01-27 MED ORDER — POTASSIUM CHLORIDE CRYS ER 20 MEQ PO TBCR
40.0000 meq | EXTENDED_RELEASE_TABLET | Freq: Once | ORAL | Status: AC
  Administered 2023-01-27: 40 meq via ORAL
  Filled 2023-01-27: qty 2

## 2023-01-27 NOTE — Progress Notes (Signed)
Patient slept most of the night. Patient had no complaints of pain or discomfort. Left knee immobilizers in place. Plan of care ongoing.

## 2023-01-27 NOTE — Plan of Care (Signed)
  Problem: Education: Goal: Knowledge of General Education information will improve Description Including pain rating scale, medication(s)/side effects and non-pharmacologic comfort measures Outcome: Progressing   Problem: Health Behavior/Discharge Planning: Goal: Ability to manage health-related needs will improve Outcome: Progressing   

## 2023-01-27 NOTE — Care Management Important Message (Signed)
Important Message  Patient Details  Name: Michael Evans MRN: [id number] Date of Birth: 01/20/1941   Medicare Important Message Given:  Yes     Tommy Medal 01/27/2023, 10:36 AM

## 2023-01-27 NOTE — Progress Notes (Signed)
Physical Therapy Treatment Patient Details Name: Michael Evans MRN: [id number] DOB: [birth year] Today's Date: 01/27/2023   History of Present Illness Michael Evans is a 82 y.o. male with medical history significant for COPD with chronic respiratory failure on 2 L, hypertension, BPH.  Patient presented to the ED with complaints of 7 feet fall off a ladder.  Patient is hard of hearing. Patient reports he was removing a beds nest when he lost his balance and fell.  He did not hit his head.  He did not lose consciousness.  No chest pain or difficulty breathing.    PT Comments    Patient demonstrates slow labored movement for sitting up at bedside requiring assistance for moving LLE due to increasing knee pain, fair/good return for completing BLE ROM/strengthening exercises requiring active assistance for LLE due to weakness/pain, able to ambulate in room without loss of balance and limited for gait training mostly due to fatigue with SpO2 at 93% while on 2 LPM O2.  Patient tolerated completing 10 triceps dips using armrest of BSC and tolerated staying up on [facility] to attempt bowel movement after therapy - nursing staff notified.  Patient will benefit from continued skilled physical therapy in hospital and recommended venue below to increase strength, balance, endurance for safe ADLs and gait.     Recommendations for follow up therapy are one component of a multi-disciplinary discharge planning process, led by the attending physician.  Recommendations may be updated based on patient status, additional functional criteria and insurance authorization.  Follow Up Recommendations  Skilled nursing-short term rehab (<3 hours/day) Can patient physically be transported by private vehicle: Yes   Assistance Recommended at Discharge    Patient can return home with the following A little help with bathing/dressing/bathroom;Help with stairs or ramp for entrance;Assistance with cooking/housework;A lot of help with  walking and/or transfers   Equipment Recommendations  Rolling walker (2 wheels)    Recommendations for Other Services       Precautions / Restrictions Precautions Precautions: Fall Required Braces or Orthoses: Knee Immobilizer - Left Knee Immobilizer - Left: On when out of bed or walking Restrictions Weight Bearing Restrictions: Yes LLE Weight Bearing: Non weight bearing     Mobility  Bed Mobility Overal bed mobility: Needs Assistance Bed Mobility: Supine to Sit     Supine to sit: Supervision, Min guard     General bed mobility comments: required assistance for moving LLE due to increasing knee pain    Transfers Overall transfer level: Needs assistance Equipment used: Rolling walker (2 wheels) Transfers: Sit to/from Stand, Bed to chair/wheelchair/BSC Sit to Stand: Min assist, Mod assist   Step pivot transfers: Min assist       General transfer comment: slow labored movement with c/o increased pain left knee    Ambulation/Gait Ambulation/Gait assistance: Min assist Gait Distance (Feet): 30 Feet Assistive device: Rolling walker (2 wheels) Gait Pattern/deviations: Decreased step length - right, Decreased step length - left, Decreased stride length, Antalgic Gait velocity: decreased     General Gait Details: slow labored cadence with good return for maintaining NWB on LLE without loss of balance, but requires increased assistance when making turns   Michael Evans (Stroke Patients Only)       Balance Overall balance assessment: Needs assistance Sitting-balance support: Feet supported, No upper extremity supported Sitting balance-Leahy Scale: Fair Sitting balance - Comments: fair/good seated at EOB  Standing balance support: During functional activity, Bilateral upper extremity supported Standing balance-Leahy Scale: Fair Standing balance comment: using RW                             Cognition Arousal/Alertness: Awake/alert Behavior During Therapy: WFL for tasks assessed/performed Overall Cognitive Status: Within Functional Limits for tasks assessed                                          Exercises General Exercises - Lower Extremity Ankle Circles/Pumps: Seated, AROM, Strengthening, 15 reps Long Arc Quad: Seated, AROM, Strengthening, Right, 15 reps Straight Leg Raises: AAROM, Strengthening, Left, Seated, 15 reps Hip Flexion/Marching: Seated, AROM, Strengthening, 15 reps, Right Heel Raises: Seated, AROM, Strengthening, Right, 15 reps Other Exercises Other Exercises: triceps dips using armrest of BSC X 10 Reps    General Comments        Pertinent Vitals/Pain Pain Assessment Pain Assessment: Faces Faces Pain Scale: Hurts little more Pain Location: left knee Pain Descriptors / Indicators: Grimacing, Discomfort, Guarding, Sore Pain Intervention(s): Limited activity within patient's tolerance, Monitored during session, Repositioned    Home Living                          Prior Function            PT Goals (current goals can now be found in the care plan section) Acute Rehab PT Goals Patient Stated Goal: return home after rehab PT Goal Formulation: With patient Time For Goal Achievement: 02/06/23 Potential to Achieve Goals: Good Progress towards PT goals: Progressing toward goals    Frequency    Min 3X/week      PT Plan      Co-evaluation              AM-PAC PT "6 Clicks" Mobility   Outcome Measure  Help needed turning from your back to your side while in a flat bed without using bedrails?: A Little Help needed moving from lying on your back to sitting on the side of a flat bed without using bedrails?: A Little Help needed moving to and from a bed to a chair (including a wheelchair)?: A Little Help needed standing up from a chair using your arms (e.g., wheelchair or bedside chair)?: A Lot Help needed to  walk in hospital room?: A Lot Help needed climbing 3-5 steps with a railing? : A Lot 6 Click Score: 15    End of Session Equipment Utilized During Treatment: Left knee immobilizer;Oxygen Activity Tolerance: Patient tolerated treatment well;Patient limited by fatigue Patient left: with call bell/phone within reach;Other (comment) (left seated on [facility]) Nurse Communication: Mobility status PT Visit Diagnosis: Unsteadiness on feet (R26.81);Other abnormalities of gait and mobility (R26.89);Muscle weakness (generalized) (M62.81)     Time: [id number] PT Time Calculation (min) (ACUTE ONLY): 34 min  Charges:  $Gait Training: 8-22 mins $Therapeutic Exercise: 8-22 mins                     12:17 PM, 01/27/23 Lonell Grandchild, MPT Physical Therapist with [facility] 336 [phone] office [phone] mobile phone

## 2023-01-27 NOTE — Progress Notes (Signed)
PROGRESS NOTE    Michael Evans  [id number] DOB: May 25, 1941 DOA: 01/20/2023 PCP: [name], MD   Brief Narrative:    82/M with history of COPD, chronic respiratory failure on 2 L home O2, hypertension, BPH, presented to the ED after a fall from 7 feet off a ladder, reportedly lost his balance and fell, did not hit his head, LOC no loss of consciousness, developed severe left knee pain, x-rays noted comminuted distracted patella fracture and blood pressure of 171/81.  Orthopedic surgery consulted and he underwent ORIF of left patellar fracture 2/22.  He now remains in a knee immobilizer and has been seen by PT/OT with recommendations for SNF placement.  He tested COVID-positive 2/23 and has been started on medications with Paxlovid and Solu-Medrol.  He has been changed to oral prednisone 2/27 and plans are for discharge to SNF by 2/29.  Assessment & Plan:   Principal Problem:   Comminuted fracture of left patella Active Problems:   Essential hypertension   COPD with emphysema (HCC)   Chronic respiratory failure with hypoxia (HCC)  Assessment and Plan:   Accidental fall off the ladder, 7 ft high-no head trauma or LOC.  No prodromes. Comminuted fracture of left patella due to fall. -Status post ORIF 2/22 -PT recommending SNF placement In knee immobilizers pending surgical repair. -Pain control and VTE prophylaxis with aspirin twice daily started 2/23 -Bowel regimen  COVID infection -Currently with ongoing symptoms of cough and wheezing -Also noted to have bilateral infiltrates on chest x-ray -Start Paxlovid and maintain on airborne precautions -Continue now on prednisone daily -SNF placement will be delayed, plans [facility] 2/29  Iron deficiency anemia-stable -Plan to give IV iron starting 2/25  Mild hyperglycemia-steroid-induced -Coverage with SSI, increased on 2/26 with mealtime insulin added -IV Solu-Medrol to prednisone today   COPD with emphysema/chronic  hypoxic RF on 2 L.  Stable. -Continue home regimen   BPH: Stable. -Resumed Flomax and Proscar  Hypokalemia -Replete and reevaluate in a.m.    DVT prophylaxis: Aspirin twice daily Code Status: Full Family Communication: None at bedside Disposition Plan: Placement to SNF Status is: Inpatient Remains inpatient appropriate because: Need for IV medications  Consultants:  Orthopedics  Procedures:  Left patella ORIF 2/22  Antimicrobials:  Anti-infectives (From admission, onward)    Start     Dose/Rate Route Frequency Ordered Stop   01/24/23 1000  nirmatrelvir/ritonavir (PAXLOVID) 3 tablet        3 tablet Oral 2 times daily 01/24/23 0704 01/29/23 0959   01/22/23 2200  ceFAZolin (ANCEF) IVPB 2g/100 mL premix        2 g 200 mL/hr over 30 Minutes Intravenous Every 8 hours 01/22/23 1733 01/23/23 1436   01/22/23 1406  ceFAZolin (ANCEF) 2-4 GM/100ML-% IVPB       Note to Pharmacy: Abbie Sons S: cabinet override      01/22/23 1406 01/22/23 1443   01/22/23 1200  ceFAZolin (ANCEF) IVPB 2g/100 mL premix        2 g 200 mL/hr over 30 Minutes Intravenous On call to O.R. 01/21/23 1540 01/22/23 1445       Subjective: Patient seen and evaluated today with no new acute complaints or concerns. No acute concerns or events noted overnight.  He continues to have some mild intermittent pain.  Objective: Vitals:   01/26/23 0801 01/26/23 1947 01/27/23 0339 01/27/23 0847  BP:  122/68 128/65   Pulse:  (!) 58 62 (!) 57  Resp:  20 18 18  Temp:  98.2 F (36.8 C) 98.5 F (36.9 C)   TempSrc:      SpO2: 99% 97% 97% 99%  Weight:      Height:        Intake/Output Summary (Last 24 hours) at 01/27/2023 0927 Last data filed at 01/27/2023 0900 Gross per 24 hour  Intake 1220.06 ml  Output 2075 ml  Net -854.94 ml   Filed Weights   01/22/23 1320 01/22/23 1746 01/23/23 0538  Weight: 57.1 kg 62.3 kg 64 kg    Examination:  General exam: Appears calm and comfortable  Respiratory system: Clear  to auscultation. Respiratory effort normal.  3 L nasal cannula Cardiovascular system: S1 & S2 heard, RRR.  Gastrointestinal system: Abdomen is soft Central nervous system: Alert and awake Extremities: Left knee dressings C/D/I with knee immobilizer present Skin: No significant lesions noted Psychiatry: Flat affect.    Data Reviewed: I have personally reviewed following labs and imaging studies  CBC: Recent Labs  Lab 01/20/23 1825 01/21/23 0500 01/23/23 0514 01/24/23 0318 01/25/23 0547 01/26/23 0511 01/27/23 0353  WBC 7.9   < > 13.0* 8.1 4.7 9.8 11.0*  NEUTROABS 6.0  --   --   --  3.5 8.0* 9.5*  HGB 15.0   < > 11.3* 10.5* 10.8* 11.0* 11.2*  HCT 45.6   < > 34.0* 32.3* 33.2* 33.4* 34.4*  MCV 89.2   < > 90.7 90.7 89.5 89.1 88.9  PLT 246   < > 166 165 190 206 215   < > = values in this interval not displayed.   Basic Metabolic Panel: Recent Labs  Lab 01/23/23 0514 01/24/23 0318 01/25/23 0547 01/26/23 0511 01/27/23 0353  NA 134* 134* 137 136 138  K 3.9 3.7 3.9 3.7 3.4*  CL 101 100 103 101 101  CO2 '24 28 27 26 28  '$ GLUCOSE 149* 152* 204* 230* 169*  BUN '17 20 21 '$ 27* 24*  CREATININE 0.84 0.91 0.85 0.82 0.78  CALCIUM 8.6* 8.2* 8.3* 8.4* 8.9  MG 1.9 1.8 2.0 2.0 1.9  PHOS 2.4*  --   --   --   --    GFR: Estimated Creatinine Clearance: 59.8 mL/min (by C-G formula based on SCr of 0.78 mg/dL). Liver Function Tests: Recent Labs  Lab 01/20/23 1825 01/23/23 0514 01/25/23 0547 01/26/23 0511 01/27/23 0353  AST 28  --  39 39 39  ALT 20  --  '17 21 22  '$ ALKPHOS 62  --  43 41 36*  BILITOT 0.4  --  0.8 0.6 0.3  PROT 7.0  --  5.7* 5.8* 5.6*  ALBUMIN 3.9 2.7* 2.7* 2.6* 2.5*   No results for input(s): "LIPASE", "AMYLASE" in the last 168 hours. No results for input(s): "AMMONIA" in the last 168 hours. Coagulation Profile: Recent Labs  Lab 01/20/23 1825  INR 0.9   Cardiac Enzymes: No results for input(s): "CKTOTAL", "CKMB", "CKMBINDEX", "TROPONINI" in the last 168  hours. BNP (last 3 results) No results for input(s): "PROBNP" in the last 8760 hours. HbA1C: Recent Labs    01/25/23 0709  HGBA1C 6.8*   CBG: Recent Labs  Lab 01/25/23 2034 01/26/23 0822 01/26/23 1123 01/26/23 1658 01/26/23 1948  GLUCAP 184* 251* 302* 260* 259*   Lipid Profile: No results for input(s): "CHOL", "HDL", "LDLCALC", "TRIG", "CHOLHDL", "LDLDIRECT" in the last 72 hours. Thyroid Function Tests: No results for input(s): "TSH", "T4TOTAL", "FREET4", "T3FREE", "THYROIDAB" in the last 72 hours. Anemia Panel: Recent Labs    01/26/23 0511 01/27/23  [place]   Sepsis Labs: Recent Labs  Lab 01/24/23 0318  PROCALCITON <0.10    Recent Results (from the past 240 hour(s))  Surgical PCR screen     Status: None   Collection Time: 01/22/23  1:02 AM   Specimen: Nasal Mucosa; Nasal Swab  Result Value Ref Range Status   MRSA, PCR NEGATIVE NEGATIVE Final   Staphylococcus aureus NEGATIVE NEGATIVE Final    Comment: (NOTE) The Xpert SA Assay (FDA approved for NASAL specimens in patients 89 years of age and older), is one component of a comprehensive surveillance program. It is not intended to diagnose infection nor to guide or monitor treatment. Performed at [facility], [address], [place], [place] [address]   SARS Coronavirus 2 by RT PCR (hospital order, performed in [facility] hospital lab) *cepheid single result test* Anterior Nasal Swab     Status: Abnormal   Collection Time: 01/23/23  3:50 PM   Specimen: Anterior Nasal Swab  Result Value Ref Range Status   SARS Coronavirus 2 by RT PCR POSITIVE (A) NEGATIVE Final    Comment: (NOTE) SARS-CoV-2 target nucleic acids are DETECTED  SARS-CoV-2 RNA is generally detectable in upper respiratory specimens  during the acute phase of infection.  Positive results are indicative  of the presence of the identified virus, but do not rule out bacterial infection or co-infection with other pathogens not detected  by the test.  Clinical correlation with patient history and  other diagnostic information is necessary to determine patient infection status.  The expected result is negative.  Fact Sheet for Patients:   https://www.patel.info/   Fact Sheet for Healthcare Providers:   https://hall.com/    This test is not yet approved or cleared by the [place] FDA and  has been authorized for detection and/or diagnosis of SARS-CoV-2 by FDA under an Emergency Use Authorization (EUA).  This EUA will remain in effect (meaning this test can be used) for the duration of  the COVID-19 declaration under Section 564(b)(1)  of the Act, 21 U.S.C. section 360-bbb-3(b)(1), unless the authorization is terminated or revoked sooner.   Performed at [facility], [address] [place], [place] [address]          Radiology Studies: No results found.      Scheduled Meds:  vitamin C  500 mg Oral Daily   aspirin EC  81 mg Oral BID   Chlorhexidine Gluconate Cloth  6 each Topical Daily   feeding supplement  237 mL Oral TID BM   finasteride  5 mg Oral Daily   influenza vaccine adjuvanted  0.5 mL Intramuscular Tomorrow-1000   insulin aspart  0-15 Units Subcutaneous TID WC   insulin aspart  0-5 Units Subcutaneous QHS   insulin aspart  3 Units Subcutaneous TID WC   mometasone-formoterol  2 puff Inhalation BID   nirmatrelvir/ritonavir  3 tablet Oral BID   pantoprazole  40 mg Oral Daily   pneumococcal 20-valent conjugate vaccine  0.5 mL Intramuscular Tomorrow-1000   polyethylene glycol  17 g Oral BID   potassium chloride  40 mEq Oral Once   predniSONE  40 mg Oral Q breakfast   tamsulosin  0.8 mg Oral Daily   umeclidinium bromide  1 puff Inhalation Daily   zinc sulfate  220 mg Oral Daily      LOS: 7 days    Time spent: 35 minutes    Ormand Senn Darleen Crocker, DO [facility]  If 7PM-7AM, please contact night-coverage  [email or web address] 01/27/2023, 9:27 AM

## 2023-01-28 DIAGNOSIS — E876 Hypokalemia: Secondary | ICD-10-CM

## 2023-01-28 DIAGNOSIS — S82042D Displaced comminuted fracture of left patella, subsequent encounter for closed fracture with routine healing: Secondary | ICD-10-CM

## 2023-01-28 LAB — COMPREHENSIVE METABOLIC PANEL
ALT: 24 U/L (ref 0–44)
AST: 34 U/L (ref 15–41)
Albumin: 2.6 g/dL — ABNORMAL LOW (ref 3.5–5.0)
Alkaline Phosphatase: 39 U/L (ref 38–126)
Anion gap: 7 (ref 5–15)
BUN: 25 mg/dL — ABNORMAL HIGH (ref 8–23)
CO2: 27 mmol/L (ref 22–32)
Calcium: 8.5 mg/dL — ABNORMAL LOW (ref 8.9–10.3)
Chloride: 100 mmol/L (ref 98–111)
Creatinine, Ser: 0.71 mg/dL (ref 0.61–1.24)
GFR, Estimated: >60 mL/min (ref >60)
Glucose, Bld: 153 mg/dL — ABNORMAL HIGH (ref 70–99)
Potassium: 3.7 mmol/L (ref 3.5–5.1)
Sodium: 134 mmol/L — ABNORMAL LOW (ref 135–145)
Total Bilirubin: 0.6 mg/dL (ref 0.3–1.2)
Total Protein: 5.6 g/dL — ABNORMAL LOW (ref 6.5–8.1)

## 2023-01-28 LAB — CBC WITH DIFFERENTIAL/PLATELET
Abs Immature Granulocytes: 0.56 10*3/uL — ABNORMAL HIGH (ref 0.00–0.07)
Basophils Absolute: 0.1 10*3/uL (ref 0.0–0.1)
Basophils Relative: 1 %
Eosinophils Absolute: 0 10*3/uL (ref 0.0–0.5)
Eosinophils Relative: 0 %
HCT: 34 % — ABNORMAL LOW (ref 39.0–52.0)
Hemoglobin: 11.4 g/dL — ABNORMAL LOW (ref 13.0–17.0)
Immature Granulocytes: 5 %
Lymphocytes Relative: 9 %
Lymphs Abs: 1 10*3/uL (ref 0.7–4.0)
MCH: 29.6 pg (ref 26.0–34.0)
MCHC: 33.5 g/dL (ref 30.0–36.0)
MCV: 88.3 fL (ref 80.0–100.0)
Monocytes Absolute: 1.1 10*3/uL — ABNORMAL HIGH (ref 0.1–1.0)
Monocytes Relative: 10 %
Neutro Abs: 8.3 10*3/uL — ABNORMAL HIGH (ref 1.7–7.7)
Neutrophils Relative %: 75 %
Platelets: 217 10*3/uL (ref 150–400)
RBC: 3.85 MIL/uL — ABNORMAL LOW (ref 4.22–5.81)
RDW: 13.5 % (ref 11.5–15.5)
WBC: 11 10*3/uL — ABNORMAL HIGH (ref 4.0–10.5)
nRBC: 0.3 % — ABNORMAL HIGH (ref 0.0–0.2)

## 2023-01-28 LAB — GLUCOSE, CAPILLARY
Glucose-Capillary: 241 mg/dL — ABNORMAL HIGH (ref 70–99)
Glucose-Capillary: 250 mg/dL — ABNORMAL HIGH (ref 70–99)
Glucose-Capillary: 208 mg/dL — ABNORMAL HIGH (ref 70–99)
Glucose-Capillary: 170 mg/dL — ABNORMAL HIGH (ref 70–99)

## 2023-01-28 LAB — C-REACTIVE PROTEIN: CRP: 1.5 mg/dL — ABNORMAL HIGH (ref <1.0)

## 2023-01-28 LAB — D-DIMER, QUANTITATIVE: D-Dimer, Quant: 1.64 ug/mL-FEU — ABNORMAL HIGH (ref 0.00–0.50)

## 2023-01-28 LAB — MAGNESIUM: Magnesium: 1.9 mg/dL (ref 1.7–2.4)

## 2023-01-28 LAB — FERRITIN: Ferritin: 613 ng/mL — ABNORMAL HIGH (ref 24–336)

## 2023-01-28 MED ORDER — INSULIN ASPART 100 UNIT/ML IJ SOLN
8.0000 [IU] | Freq: Three times a day (TID) | INTRAMUSCULAR | Status: DC
  Administered 2023-01-28: 8 [IU] via SUBCUTANEOUS

## 2023-01-28 NOTE — Plan of Care (Signed)
  Problem: Education: Goal: Knowledge of General Education information will improve Description Including pain rating scale, medication(s)/side effects and non-pharmacologic comfort measures Outcome: Progressing   

## 2023-01-28 NOTE — Progress Notes (Signed)
PROGRESS NOTE    Michael Evans  [id number] DOB: [birth year] DOA: 01/20/2023 PCP: [name], MD   Brief Narrative:    82/M with history of COPD, chronic respiratory failure on 2 L home O2, hypertension, BPH, presented to the ED after a fall from 7 feet off a ladder, reportedly lost his balance and fell, did not hit his head, LOC no loss of consciousness, developed severe left knee pain, x-rays noted comminuted distracted patella fracture and blood pressure of 171/81.  Orthopedic surgery consulted and he underwent ORIF of left patellar fracture 2/22.  He now remains in a knee immobilizer and has been seen by PT/OT with recommendations for SNF placement.  He tested COVID-positive 2/23 and has been started on medications with Paxlovid and Solu-Medrol.  He has been changed to oral prednisone 2/27 and plans are for discharge to SNF by 2/29.  Assessment & Plan:   Principal Problem:   Comminuted fracture of left patella Active Problems:   Essential hypertension   COPD with emphysema (HCC)   Chronic respiratory failure with hypoxia (HCC)  Assessment and Plan:   Accidental fall off the ladder, 7 ft high-no head trauma or LOC.  No prodromes. Comminuted fracture of left patella due to fall. -Status post ORIF 2/22 -PT recommending SNF placement In knee immobilizers pending surgical repair. -Pain control and VTE prophylaxis with aspirin twice daily started 2/23 -Bowel regimen ordered   COVID infection -Currently with ongoing symptoms of cough and wheezing -Also noted to have bilateral infiltrates on chest x-ray -Start Paxlovid and maintain on airborne precautions -Continue now on prednisone daily for 10 day course of treatment  -SNF placement plans for [facility] 2/29  Iron deficiency anemia-stable -s/p IV iron starting 2/25  Hyperglycemia-steroid-induced -Coverage with SSI, increased on 2/26 with mealtime insulin added -IV Solu-Medrol to prednisone  -increased prandial coverage to  8 units TID with meals>50%   COPD with emphysema/chronic hypoxic RF on 2 L.  Stable. -Continue home regimen   BPH: Stable. -Resumed Flomax and Proscar  Hypokalemia -Repleted     DVT prophylaxis: Aspirin twice daily Code Status: Full Family Communication: None at bedside Disposition Plan: Placement to SNF Status is: Inpatient Remains inpatient appropriate because: Need for IV medications  Consultants:  Orthopedics  Procedures:  Left patella ORIF 2/22  Antimicrobials:  Anti-infectives (From admission, onward)    Start     Dose/Rate Route Frequency Ordered Stop   01/24/23 1000  nirmatrelvir/ritonavir (PAXLOVID) 3 tablet        3 tablet Oral 2 times daily 01/24/23 0704 01/29/23 0959   01/22/23 2200  ceFAZolin (ANCEF) IVPB 2g/100 mL premix        2 g 200 mL/hr over 30 Minutes Intravenous Every 8 hours 01/22/23 1733 01/23/23 1436   01/22/23 1406  ceFAZolin (ANCEF) 2-4 GM/100ML-% IVPB       Note to Pharmacy: Abbie Sons S: cabinet override      01/22/23 1406 01/22/23 1443   01/22/23 1200  ceFAZolin (ANCEF) IVPB 2g/100 mL premix        2 g 200 mL/hr over 30 Minutes Intravenous On call to O.R. 01/21/23 1540 01/22/23 1445       Subjective: No specific complaints.  Objective: Vitals:   01/28/23 0750 01/28/23 0804 01/28/23 0805 01/28/23 1333  BP: 121/68   121/62  Pulse: 68   73  Resp: 18     Temp: 97.6 F (36.4 C)   98 F (36.7 C)  TempSrc: Oral   Oral  SpO2: 97% 96% 98% 94%  Weight:      Height:        Intake/Output Summary (Last 24 hours) at 01/28/2023 1531 Last data filed at 01/28/2023 1333 Gross per 24 hour  Intake 920 ml  Output 1750 ml  Net -830 ml   Filed Weights   01/22/23 1320 01/22/23 1746 01/23/23 0538  Weight: 57.1 kg 62.3 kg 64 kg    Examination:  General exam: Appears calm and comfortable  Respiratory system: Clear to auscultation. Respiratory effort normal.  3 L nasal cannula Cardiovascular system: S1 & S2 heard  normal. Gastrointestinal system: Abdomen is soft Central nervous system: Alert and awake Extremities: Left knee dressings C/D/I with knee immobilizer present Skin: No significant lesions noted Psychiatry: normal affect.  Data Reviewed: I have personally reviewed following labs and imaging studies  CBC: Recent Labs  Lab 01/24/23 0318 01/25/23 0547 01/26/23 0511 01/27/23 0353 01/28/23 0353  WBC 8.1 4.7 9.8 11.0* 11.0*  NEUTROABS  --  3.5 8.0* 9.5* 8.3*  HGB 10.5* 10.8* 11.0* 11.2* 11.4*  HCT 32.3* 33.2* 33.4* 34.4* 34.0*  MCV 90.7 89.5 89.1 88.9 88.3  PLT 165 190 206 215 [id number]   Basic Metabolic Panel: Recent Labs  Lab 01/23/23 0514 01/24/23 0318 01/25/23 0547 01/26/23 0511 01/27/23 0353 01/28/23 0353  NA 134* 134* 137 136 138 134*  K 3.9 3.7 3.9 3.7 3.4* 3.7  CL 101 100 103 101 101 100  CO2 '24 28 27 26 28 27  '$ GLUCOSE 149* 152* 204* 230* 169* 153*  BUN '17 20 21 '$ 27* 24* 25*  CREATININE 0.84 0.91 0.85 0.82 0.78 0.71  CALCIUM 8.6* 8.2* 8.3* 8.4* 8.9 8.5*  MG 1.9 1.8 2.0 2.0 1.9 1.9  PHOS 2.4*  --   --   --   --   --    GFR: Estimated Creatinine Clearance: 59.8 mL/min (by C-G formula based on SCr of 0.71 mg/dL). Liver Function Tests: Recent Labs  Lab 01/23/23 0514 01/25/23 0547 01/26/23 0511 01/27/23 0353 01/28/23 0353  AST  --  39 39 39 34  ALT  --  '17 21 22 24  '$ ALKPHOS  --  43 41 36* 39  BILITOT  --  0.8 0.6 0.3 0.6  PROT  --  5.7* 5.8* 5.6* 5.6*  ALBUMIN 2.7* 2.7* 2.6* 2.5* 2.6*   No results for input(s): "LIPASE", "AMYLASE" in the last 168 hours. No results for input(s): "AMMONIA" in the last 168 hours. Coagulation Profile: No results for input(s): "INR", "PROTIME" in the last 168 hours.  Cardiac Enzymes: No results for input(s): "CKTOTAL", "CKMB", "CKMBINDEX", "TROPONINI" in the last 168 hours. BNP (last 3 results) No results for input(s): "PROBNP" in the last 8760 hours. HbA1C: No results for input(s): "HGBA1C" in the last 72 hours.  CBG: Recent  Labs  Lab 01/27/23 1145 01/27/23 1618 01/27/23 2151 01/28/23 0725 01/28/23 1117  GLUCAP 254* 150* 202* 241* 250*   Lipid Profile: No results for input(s): "CHOL", "HDL", "LDLCALC", "TRIG", "CHOLHDL", "LDLDIRECT" in the last 72 hours. Thyroid Function Tests: No results for input(s): "TSH", "T4TOTAL", "FREET4", "T3FREE", "THYROIDAB" in the last 72 hours. Anemia Panel: Recent Labs    01/27/23 0353 01/28/23 0353  FERRITIN 408* 613*   Sepsis Labs: Recent Labs  Lab 01/24/23 0318  PROCALCITON <0.10    Recent Results (from the past 240 hour(s))  Surgical PCR screen     Status: None   Collection Time: 01/22/23  1:02 AM   Specimen: Nasal Mucosa; Nasal Swab  Result Value Ref Range Status   MRSA, PCR NEGATIVE NEGATIVE Final   Staphylococcus aureus NEGATIVE NEGATIVE Final    Comment: (NOTE) The Xpert SA Assay (FDA approved for NASAL specimens in patients 35 years of age and older), is one component of a comprehensive surveillance program. It is not intended to diagnose infection nor to guide or monitor treatment. Performed at [facility], [address] [place], [place] [address]   SARS Coronavirus 2 by RT PCR (hospital order, performed in [facility] hospital lab) *cepheid single result test* Anterior Nasal Swab     Status: Abnormal   Collection Time: 01/23/23  3:50 PM   Specimen: Anterior Nasal Swab  Result Value Ref Range Status   SARS Coronavirus 2 by RT PCR POSITIVE (A) NEGATIVE Final    Comment: (NOTE) SARS-CoV-2 target nucleic acids are DETECTED  SARS-CoV-2 RNA is generally detectable in upper respiratory specimens  during the acute phase of infection.  Positive results are indicative  of the presence of the identified virus, but do not rule out bacterial infection or co-infection with other pathogens not detected by the test.  Clinical correlation with patient history and  other diagnostic information is necessary to determine patient infection status.  The expected  result is negative.  Fact Sheet for Patients:   https://www.patel.info/   Fact Sheet for Healthcare Providers:   https://hall.com/    This test is not yet approved or cleared by the [place] FDA and  has been authorized for detection and/or diagnosis of SARS-CoV-2 by FDA under an Emergency Use Authorization (EUA).  This EUA will remain in effect (meaning this test can be used) for the duration of  the COVID-19 declaration under Section 564(b)(1)  of the Act, 21 U.S.C. section 360-bbb-3(b)(1), unless the authorization is terminated or revoked sooner.   Performed at [facility], [address], [place], [place] [address]      Radiology Studies: No results found.  Scheduled Meds:  vitamin C  500 mg Oral Daily   aspirin EC  81 mg Oral BID   Chlorhexidine Gluconate Cloth  6 each Topical Daily   feeding supplement  237 mL Oral TID BM   finasteride  5 mg Oral Daily   influenza vaccine adjuvanted  0.5 mL Intramuscular Tomorrow-1000   insulin aspart  0-15 Units Subcutaneous TID WC   insulin aspart  0-5 Units Subcutaneous QHS   insulin aspart  3 Units Subcutaneous TID WC   mometasone-formoterol  2 puff Inhalation BID   nirmatrelvir/ritonavir  3 tablet Oral BID   pantoprazole  40 mg Oral Daily   pneumococcal 20-valent conjugate vaccine  0.5 mL Intramuscular Tomorrow-1000   polyethylene glycol  17 g Oral BID   predniSONE  40 mg Oral Q breakfast   tamsulosin  0.8 mg Oral Daily   umeclidinium bromide  1 puff Inhalation Daily   zinc sulfate  220 mg Oral Daily      LOS: 8 days    Time spent: 35 minutes  Jeris Easterly, MD  [facility]  If 7PM-7AM, please contact night-coverage [email or web address] 01/28/2023, 3:31 PM

## 2023-01-28 NOTE — TOC Progression Note (Signed)
Transition of Care [facility]) - Progression Note    Patient Details  Name: Michael Evans MRN: [id number] Date of Birth: 1941-05-05  Transition of Care [facility]) CM/SW Contact  Salome Arnt,  Phone Number: 01/28/2023, 10:24 AM  Clinical Narrative:  LCSW confirmed plan with [facility] for pt to d/c tomorrow. CMA starting authorization today.      Expected Discharge Plan: [place] Barriers to Discharge: Continued Medical Work up  Expected Discharge Plan and Services In-house Referral: Clinical Social Work   Post Acute Care Choice: [place] Living arrangements for the past 2 months: Single Family Home Expected Discharge Date: 01/23/23                                     Social Determinants of Health (SDOH) Interventions SDOH Screenings   Food Insecurity: No Food Insecurity (01/20/2023)  Housing: Low Risk  (01/20/2023)  Transportation Needs: No Transportation Needs (01/20/2023)  Utilities: Not At Risk (01/20/2023)  Alcohol Screen: Low Risk  (02/07/2022)  Depression (PHQ2-9): Low Risk  (10/16/2022)  Financial Resource Strain: Low Risk  (02/07/2022)  Physical Activity: Insufficiently Active (02/07/2022)  Social Connections: Socially Isolated (02/07/2022)  Stress: No Stress Concern Present (02/07/2022)  Tobacco Use: Medium Risk (01/22/2023)    Readmission Risk Interventions    01/23/2023    1:13 PM  Readmission Risk Prevention Plan  Medication Screening Complete  Transportation Screening Complete

## 2023-01-29 ENCOUNTER — Encounter (HOSPITAL_COMMUNITY): Payer: Self-pay | Admitting: Internal Medicine

## 2023-01-29 DIAGNOSIS — J1282 Pneumonia due to coronavirus disease 2019: Secondary | ICD-10-CM | POA: Diagnosis not present

## 2023-01-29 DIAGNOSIS — S82042D Displaced comminuted fracture of left patella, subsequent encounter for closed fracture with routine healing: Secondary | ICD-10-CM | POA: Diagnosis not present

## 2023-01-29 DIAGNOSIS — Z8616 Personal history of COVID-19: Secondary | ICD-10-CM | POA: Diagnosis not present

## 2023-01-29 DIAGNOSIS — J439 Emphysema, unspecified: Secondary | ICD-10-CM | POA: Diagnosis not present

## 2023-01-29 DIAGNOSIS — J449 Chronic obstructive pulmonary disease, unspecified: Secondary | ICD-10-CM | POA: Diagnosis not present

## 2023-01-29 DIAGNOSIS — I10 Essential (primary) hypertension: Secondary | ICD-10-CM | POA: Diagnosis not present

## 2023-01-29 DIAGNOSIS — U071 COVID-19: Secondary | ICD-10-CM | POA: Diagnosis not present

## 2023-01-29 DIAGNOSIS — Z4789 Encounter for other orthopedic aftercare: Secondary | ICD-10-CM | POA: Diagnosis not present

## 2023-01-29 DIAGNOSIS — K219 Gastro-esophageal reflux disease without esophagitis: Secondary | ICD-10-CM | POA: Diagnosis not present

## 2023-01-29 DIAGNOSIS — J9611 Chronic respiratory failure with hypoxia: Secondary | ICD-10-CM | POA: Diagnosis not present

## 2023-01-29 DIAGNOSIS — Z9181 History of falling: Secondary | ICD-10-CM | POA: Diagnosis not present

## 2023-01-29 DIAGNOSIS — Z9981 Dependence on supplemental oxygen: Secondary | ICD-10-CM | POA: Diagnosis not present

## 2023-01-29 LAB — GLUCOSE, CAPILLARY
Glucose-Capillary: 130 mg/dL — ABNORMAL HIGH (ref 70–99)
Glucose-Capillary: 109 mg/dL — ABNORMAL HIGH (ref 70–99)

## 2023-01-29 LAB — CBC WITH DIFFERENTIAL/PLATELET
Abs Immature Granulocytes: 0.7 10*3/uL — ABNORMAL HIGH (ref 0.00–0.07)
Band Neutrophils: 2 %
Basophils Absolute: 0 10*3/uL (ref 0.0–0.1)
Basophils Relative: 0 %
Eosinophils Absolute: 0 10*3/uL (ref 0.0–0.5)
Eosinophils Relative: 0 %
HCT: 35.8 % — ABNORMAL LOW (ref 39.0–52.0)
Hemoglobin: 11.5 g/dL — ABNORMAL LOW (ref 13.0–17.0)
Lymphocytes Relative: 10 %
Lymphs Abs: 1.2 10*3/uL (ref 0.7–4.0)
MCH: 29 pg (ref 26.0–34.0)
MCHC: 32.1 g/dL (ref 30.0–36.0)
MCV: 90.4 fL (ref 80.0–100.0)
Metamyelocytes Relative: 3 %
Monocytes Absolute: 1.1 10*3/uL — ABNORMAL HIGH (ref 0.1–1.0)
Monocytes Relative: 9 %
Myelocytes: 3 %
Neutro Abs: 8.8 10*3/uL — ABNORMAL HIGH (ref 1.7–7.7)
Neutrophils Relative %: 73 %
Platelets: 235 10*3/uL (ref 150–400)
RBC: 3.96 MIL/uL — ABNORMAL LOW (ref 4.22–5.81)
RDW: 13.2 % (ref 11.5–15.5)
WBC: 11.7 10*3/uL — ABNORMAL HIGH (ref 4.0–10.5)
nRBC: 0.4 % — ABNORMAL HIGH (ref 0.0–0.2)

## 2023-01-29 LAB — COMPREHENSIVE METABOLIC PANEL
ALT: 22 U/L (ref 0–44)
AST: 23 U/L (ref 15–41)
Albumin: 2.6 g/dL — ABNORMAL LOW (ref 3.5–5.0)
Alkaline Phosphatase: 39 U/L (ref 38–126)
Anion gap: 7 (ref 5–15)
BUN: 32 mg/dL — ABNORMAL HIGH (ref 8–23)
CO2: 31 mmol/L (ref 22–32)
Calcium: 8.6 mg/dL — ABNORMAL LOW (ref 8.9–10.3)
Chloride: 100 mmol/L (ref 98–111)
Creatinine, Ser: 0.88 mg/dL (ref 0.61–1.24)
GFR, Estimated: >60 mL/min (ref >60)
Glucose, Bld: 117 mg/dL — ABNORMAL HIGH (ref 70–99)
Potassium: 3.5 mmol/L (ref 3.5–5.1)
Sodium: 138 mmol/L (ref 135–145)
Total Bilirubin: 0.6 mg/dL (ref 0.3–1.2)
Total Protein: 5.7 g/dL — ABNORMAL LOW (ref 6.5–8.1)

## 2023-01-29 LAB — MAGNESIUM: Magnesium: 2.1 mg/dL (ref 1.7–2.4)

## 2023-01-29 LAB — D-DIMER, QUANTITATIVE: D-Dimer, Quant: 1.45 ug/mL-FEU — ABNORMAL HIGH (ref 0.00–0.50)

## 2023-01-29 LAB — C-REACTIVE PROTEIN: CRP: 1.1 mg/dL — ABNORMAL HIGH (ref <1.0)

## 2023-01-29 LAB — FERRITIN: Ferritin: 550 ng/mL — ABNORMAL HIGH (ref 24–336)

## 2023-01-29 MED ORDER — GUAIFENESIN-DM 100-10 MG/5ML PO SYRP: 10.0000 mL | ORAL_SOLUTION | ORAL | 0 refills | Status: DC | PRN

## 2023-01-29 MED ORDER — PANTOPRAZOLE SODIUM 40 MG PO TBEC: 40.0000 mg | DELAYED_RELEASE_TABLET | Freq: Every day | ORAL | Status: DC

## 2023-01-29 MED ORDER — ENSURE ENLIVE PO LIQD: 237.0000 mL | Freq: Three times a day (TID) | ORAL | 12 refills | Status: AC

## 2023-01-29 MED ORDER — PREDNISONE 20 MG PO TABS: 20.0000 mg | ORAL_TABLET | Freq: Every day | ORAL | Status: DC

## 2023-01-29 MED ORDER — POLYETHYLENE GLYCOL 3350 17 G PO PACK: 17.0000 g | PACK | Freq: Every day | ORAL | 0 refills | Status: DC

## 2023-01-29 MED ORDER — IPRATROPIUM-ALBUTEROL 0.5-2.5 (3) MG/3ML IN SOLN: 3.0000 mL | Freq: Four times a day (QID) | RESPIRATORY_TRACT | Status: DC | PRN

## 2023-01-29 MED ORDER — OXYCODONE HCL 5 MG PO TABS: 5.0000 mg | ORAL_TABLET | ORAL | 0 refills | Status: DC | PRN

## 2023-01-29 MED ORDER — ZINC SULFATE 220 (50 ZN) MG PO CAPS: 220.0000 mg | ORAL_CAPSULE | Freq: Every day | ORAL | 0 refills | Status: AC

## 2023-01-29 MED ORDER — PREDNISONE 20 MG PO TABS
20.0000 mg | ORAL_TABLET | Freq: Every day | ORAL | Status: DC
  Administered 2023-01-29: 20 mg via ORAL
  Filled 2023-01-29: qty 1

## 2023-01-29 MED ORDER — PREDNISONE 20 MG PO TABS: 20.0000 mg | ORAL_TABLET | Freq: Every day | ORAL | 0 refills | Status: AC

## 2023-01-29 MED ORDER — ASPIRIN 81 MG PO TBEC: 81.0000 mg | DELAYED_RELEASE_TABLET | Freq: Two times a day (BID) | ORAL | 0 refills | Status: AC

## 2023-01-29 NOTE — Progress Notes (Signed)
Report called to receiving nurse at [facility].

## 2023-01-29 NOTE — Discharge Summary (Signed)
Physician Discharge Summary  Michael Evans [id number] DOB: 1941/08/02 DOA: 01/20/2023  PCP: [name], MD Orthopedics: Dr. Sherald Hess  Admit date: 01/20/2023 Discharge date: 01/29/2023  Disposition:  SNF Rehab  Recommendations for Outpatient Follow-up:  Follow up with Dr. Amedeo Kinsman - orthopedics in 7-10 days for postop check up Please obtain BMP/CBC in 1-2 weeks Please continue aspirin 81 mg BID for DVT prophylaxis until follow up with orthopedics  Discharge Condition: STABLE   CODE STATUS: FULL  DIET:  regular    Brief Hospitalization Summary: Please see all hospital notes, images, labs for full details of the hospitalization. ADMISSION HPI:  82 y.o. male with medical history significant for COPD with chronic respiratory failure on 2 L, hypertension, BPH.  Patient presented to the ED with complaints of 7 feet fall off a ladder.  Patient is hard of hearing.   Patient reports he was removing a beds nest when he lost his balance and fell.  He did not hit his head.  He did not lose consciousness.  No chest pain or difficulty breathing.   ED Course: Blood pressure 171/81 otherwise stable vitals.  X-ray of the left knee shows comminuted distracted patella fracture.  EDP talked to Dr. Amedeo Kinsman, considering patient's home situation, he does not have social support at home, recommended admission, knee immobilizer and orthopedics consultation.   HOSPITAL COURSE BY PROBLEM LIST    Accidental fall off the ladder, 7 ft high-no head trauma or LOC.  No prodromes. Comminuted fracture of left patella due to fall. -Status post ORIF 2/22 by Dr. Amedeo Kinsman -PT recommending SNF placement -Pain control and VTE prophylaxis with aspirin 81 mg twice daily started 2/23 per orthopedics - added daily protonix 40 mg for GI protection -Bowel regimen ordered  -He will need to follow up with Dr. Amedeo Kinsman in 1 week (2 weeks post procedure) -continue aspirin DVT prophylaxis until follow up with orthopedics for further  recommendation   COVID infection - treated  -resolved symptoms of cough and wheezing -Also noted to have bilateral infiltrates on chest x-ray -treated with a course of Paxlovid and maintained on airborne precautions -Continue now on prednisone daily for 10 day course of treatment (only 3 more days remaining after discharge to complete course). -SNF placement for [facility] 2/29   Iron deficiency anemia-stable -s/p IV iron starting 2/25 -Hg up to 11.5 -recommend rechecking CBC in 1-2 weeks    Hyperglycemia-steroid-induced -Coverage with [employer], increased on 2/26 with mealtime insulin added -IV Solu-Medrol to prednisone  -weaning down steroids, he has only 3 more doses to complete and will not continue prednisone  CBG (last 3)  Recent Labs    01/28/23 2059 01/29/23 0316 01/29/23 0717  GLUCAP 170* 130* 109*    COPD with emphysema/chronic hypoxic RF on 2 L.  Stable. -Continue home regimen with PRN bronchodilators as needed    BPH: Stable. -Resumed home Flomax and Proscar   Hypokalemia -Repleted    Discharge Diagnoses:  Principal Problem:   Comminuted fracture of left patella Active Problems:   Essential hypertension   COPD with emphysema (HCC)   Chronic respiratory failure with hypoxia [facility])   Discharge Instructions: Discharge Instructions     Ambulatory referral to Orthopedics   Complete by: As directed    Diet - low sodium heart healthy   Complete by: As directed    Diet - low sodium heart healthy   Complete by: As directed    Increase activity slowly   Complete by: As directed  Increase activity slowly   Complete by: As directed       Allergies as of 01/29/2023       Reactions   Benzyl Alcohol Itching   Amlodipine Swelling   Swelling in feet   Aspirin Other (See Comments)   Upset stomach.   Other Hypertension   Hazelnuts   Penicillins Hives   Has patient had a PCN reaction causing immediate rash, facial/tongue/throat swelling, SOB or lightheadedness with  hypotension: Yes Has patient had a PCN reaction causing severe rash involving mucus membranes or skin necrosis: No Has patient had a PCN reaction that required hospitalization No Has patient had a PCN reaction occurring within the last 10 years: No If all of the above answers are "NO", then may proceed with Cephalosporin use.   Sulfa Antibiotics Hives   Tylenol [acetaminophen] Other (See Comments)   Pt reports he has had liver issues and was told not to take tylenol         Medication List     STOP taking these medications    ondansetron 4 MG disintegrating tablet Commonly known as: ZOFRAN-ODT       TAKE these medications    albuterol 108 (90 Base) MCG/ACT inhaler Commonly known as: VENTOLIN HFA INHALE 1-2 PUFFS BY MOUTH EVERY 6 HOURS AS NEEDED FOR WHEEZE OR SHORTNESS OF BREATH What changed: See the new instructions.   aspirin EC 81 MG tablet Take 1 tablet (81 mg total) by mouth 2 (two) times daily for 14 days. Swallow whole.   Breztri Aerosphere 160-9-4.8 MCG/ACT Aero Generic drug: Budeson-Glycopyrrol-Formoterol Inhale 2 puffs into the lungs in the morning and at bedtime.   cholecalciferol 25 MCG (1000 UT) tablet Generic drug: Cholecalciferol Take 3,000 Units by mouth daily.   cyanocobalamin 2000 MCG tablet Take 1,000 mcg by mouth daily.   feeding supplement Liqd Take 237 mLs by mouth 3 (three) times daily between meals.   finasteride 5 MG tablet Commonly known as: Proscar Take 1 tablet (5 mg total) by mouth daily.   FISH OIL CONCENTRATE PO Take 1,000 mg by mouth daily.   guaiFENesin-dextromethorphan 100-10 MG/5ML syrup Commonly known as: ROBITUSSIN DM Take 10 mLs by mouth every 4 (four) hours as needed for cough.   ipratropium-albuterol 0.5-2.5 (3) MG/3ML Soln Commonly known as: DUONEB Take 3 mLs by nebulization every 6 (six) hours as needed (wheezing, shortness of breath).   multivitamin tablet Take 1 tablet by mouth daily. Men's health   Nebulizer  Misc 1 each by Does not apply route daily as needed. PLEASE PROVIDE NEBULIZER KIT TO INCLUDE TUBING/MASK/MOUTHPIECE   oxyCODONE 5 MG immediate release tablet Commonly known as: Roxicodone Take 1 tablet (5 mg total) by mouth every 4 (four) hours as needed for severe pain or breakthrough pain.   pantoprazole 40 MG tablet Commonly known as: PROTONIX Take 1 tablet (40 mg total) by mouth daily.   polyethylene glycol 17 g packet Commonly known as: MIRALAX / GLYCOLAX Take 17 g by mouth daily.   predniSONE 20 MG tablet Commonly known as: DELTASONE Take 1 tablet (20 mg total) by mouth daily with breakfast for 3 days. Start taking on: January 30, 2023   pyridoxine 100 MG tablet Commonly known as: B-6 Take 100 mg by mouth daily.   tamsulosin 0.4 MG Caps capsule Commonly known as: FLOMAX Take 2 capsules (0.8 mg total) by mouth daily.   vitamin C 1000 MG tablet Take 1,000 mg by mouth daily.   zinc sulfate 220 (50 Zn) MG  capsule Take 1 capsule (220 mg total) by mouth daily.        Contact information for follow-up providers     [name], MD. Schedule an appointment as soon as possible for a visit in 1 week(s).   Specialty: Family Medicine Contact information: [address] [place] [address] [phone]         Mordecai Rasmussen, MD. Schedule an appointment as soon as possible for a visit in 1 week(s).   Specialties: Orthopedic Surgery, Sports Medicine Why: Hospital Follow Up Contact information: [place]. [address] [place] [address] [phone]              Contact information for after-discharge care     [facility] .   Service: Skilled Nursing Contact information: [address] [place] [address] [phone]                    Allergies  Allergen Reactions   Benzyl Alcohol Itching   Amlodipine Swelling    Swelling in feet   Aspirin Other (See Comments)    Upset  stomach.   Other Hypertension    Hazelnuts   Penicillins Hives    Has patient had a PCN reaction causing immediate rash, facial/tongue/throat swelling, SOB or lightheadedness with hypotension: Yes Has patient had a PCN reaction causing severe rash involving mucus membranes or skin necrosis: No Has patient had a PCN reaction that required hospitalization No Has patient had a PCN reaction occurring within the last 10 years: No If all of the above answers are "NO", then may proceed with Cephalosporin use.    Sulfa Antibiotics Hives   Tylenol [Acetaminophen] Other (See Comments)    Pt reports he has had liver issues and was told not to take tylenol    Allergies as of 01/29/2023       Reactions   Benzyl Alcohol Itching   Amlodipine Swelling   Swelling in feet   Aspirin Other (See Comments)   Upset stomach.   Other Hypertension   Hazelnuts   Penicillins Hives   Has patient had a PCN reaction causing immediate rash, facial/tongue/throat swelling, SOB or lightheadedness with hypotension: Yes Has patient had a PCN reaction causing severe rash involving mucus membranes or skin necrosis: No Has patient had a PCN reaction that required hospitalization No Has patient had a PCN reaction occurring within the last 10 years: No If all of the above answers are "NO", then may proceed with Cephalosporin use.   Sulfa Antibiotics Hives   Tylenol [acetaminophen] Other (See Comments)   Pt reports he has had liver issues and was told not to take tylenol         Medication List     STOP taking these medications    ondansetron 4 MG disintegrating tablet Commonly known as: ZOFRAN-ODT       TAKE these medications    albuterol 108 (90 Base) MCG/ACT inhaler Commonly known as: VENTOLIN HFA INHALE 1-2 PUFFS BY MOUTH EVERY 6 HOURS AS NEEDED FOR WHEEZE OR SHORTNESS OF BREATH What changed: See the new instructions.   aspirin EC 81 MG tablet Take 1 tablet (81 mg total) by mouth 2 (two) times daily  for 14 days. Swallow whole.   Breztri Aerosphere 160-9-4.8 MCG/ACT Aero Generic drug: Budeson-Glycopyrrol-Formoterol Inhale 2 puffs into the lungs in the morning and at bedtime.   cholecalciferol 25 MCG (1000 UT) tablet Generic  drug: Cholecalciferol Take 3,000 Units by mouth daily.   cyanocobalamin 2000 MCG tablet Take 1,000 mcg by mouth daily.   feeding supplement Liqd Take 237 mLs by mouth 3 (three) times daily between meals.   finasteride 5 MG tablet Commonly known as: Proscar Take 1 tablet (5 mg total) by mouth daily.   FISH OIL CONCENTRATE PO Take 1,000 mg by mouth daily.   guaiFENesin-dextromethorphan 100-10 MG/5ML syrup Commonly known as: ROBITUSSIN DM Take 10 mLs by mouth every 4 (four) hours as needed for cough.   ipratropium-albuterol 0.5-2.5 (3) MG/3ML Soln Commonly known as: DUONEB Take 3 mLs by nebulization every 6 (six) hours as needed (wheezing, shortness of breath).   multivitamin tablet Take 1 tablet by mouth daily. Men's health   Nebulizer Misc 1 each by Does not apply route daily as needed. PLEASE PROVIDE NEBULIZER KIT TO INCLUDE TUBING/MASK/MOUTHPIECE   oxyCODONE 5 MG immediate release tablet Commonly known as: Roxicodone Take 1 tablet (5 mg total) by mouth every 4 (four) hours as needed for severe pain or breakthrough pain.   pantoprazole 40 MG tablet Commonly known as: PROTONIX Take 1 tablet (40 mg total) by mouth daily.   polyethylene glycol 17 g packet Commonly known as: MIRALAX / GLYCOLAX Take 17 g by mouth daily.   predniSONE 20 MG tablet Commonly known as: DELTASONE Take 1 tablet (20 mg total) by mouth daily with breakfast for 3 days. Start taking on: January 30, 2023   pyridoxine 100 MG tablet Commonly known as: B-6 Take 100 mg by mouth daily.   tamsulosin 0.4 MG Caps capsule Commonly known as: FLOMAX Take 2 capsules (0.8 mg total) by mouth daily.   vitamin C 1000 MG tablet Take 1,000 mg by mouth daily.   zinc sulfate 220 (50  Zn) MG capsule Take 1 capsule (220 mg total) by mouth daily.        Procedures/Studies: DG CHEST PORT 1 VIEW  Result Date: 01/24/2023 CLINICAL DATA:  COVID-19 EXAM: PORTABLE CHEST 1 VIEW COMPARISON:  01/20/2023 FINDINGS: Stable cardiomediastinal contours. Aortic atherosclerosis. Chronic emphysematous lung changes. Increasing interstitial markings in the perihilar and bibasilar regions. Additional bandlike bibasilar opacities, likely atelectasis. Possible trace right pleural effusion. No pneumothorax. IMPRESSION: Increasing interstitial markings in the perihilar and bibasilar regions, which may reflect developing atypical/viral infection. Electronically Signed   By: Davina Poke D.O.   On: 01/24/2023 09:18   DG Knee 1-2 Views Left  Result Date: 01/22/2023 CLINICAL DATA:  Postop after patellar fracture repair EXAM: LEFT KNEE - 1-2 VIEW COMPARISON:  Radiographs 01/20/2023 FINDINGS: Expected postoperative change after ORIF of the left patella fracture. Cutaneous staples. Mild adjacent edema. No radiographic evidence of hardware loosening. IMPRESSION: Expected postoperative change after ORIF of the left patella fracture. Electronically Signed   By: Placido Sou M.D.   On: 01/22/2023 17:39   DG Knee 1-2 Views Left  Result Date: 01/22/2023 CLINICAL DATA:  Fluoro guidance provided EXAM: LEFT KNEE - 1-2 VIEW FINDINGS: Dose: 2.9 mGy Fluoro time 41 s IMPRESSION: C-arm fluoro guidance provided. Electronically Signed   By: Sammie Bench M.D.   On: 01/22/2023 16:44   DG C-Arm 1-60 Min-No Report  Result Date: 01/22/2023 Fluoroscopy was utilized by the requesting physician.  No radiographic interpretation.   DG Knee 2 Views Left  Result Date: 01/20/2023 CLINICAL DATA:  Patellar fracture. EXAM: LEFT KNEE - 1-2 VIEW COMPARISON:  None Available. FINDINGS: Status post knee immobilization. There is a comminuted distracted patellar fracture now approximately 3.1 cm distraction  between the fracture  fragments. Marked soft tissue swelling and edema about the anterior aspect of the patella. IMPRESSION: Comminuted distracted patellar fracture status post immobilization. Electronically Signed   By: Keane Police D.O.   On: 01/20/2023 23:08   DG Chest 1 View  Result Date: 01/20/2023 CLINICAL DATA:  Fall, pt on O2, left knee pain EXAM: CHEST  1 VIEW COMPARISON:  07/28/2022 FINDINGS: Lungs are clear. Heart size and mediastinal contours are within normal limits. Aortic Atherosclerosis (ICD10-170.0). No effusion. Visualized bones unremarkable. IMPRESSION: No acute cardiopulmonary disease. Electronically Signed   By: Lucrezia Europe M.D.   On: 01/20/2023 18:21   DG Knee Complete 4 Views Left  Result Date: 01/20/2023 CLINICAL DATA:  Pain post fall EXAM: LEFT KNEE - COMPLETE 4+ VIEW FINDINGS: Comminuted patellar fracture, dominant fracture fragments distracted approximately 6 cm. The distal femur and proximal tibia appear intact. IMPRESSION: Comminuted, distracted patellar fracture. Electronically Signed   By: Lucrezia Europe M.D.   On: 01/20/2023 18:21     Subjective: Pt reports that pain is controlled. He is agreeable to SNF.  He denies CP and denies SOB.    Discharge Exam: Vitals:   01/29/23 0722 01/29/23 0753  BP:    Pulse:    Resp:    Temp:    SpO2: 97% 97%   Vitals:   01/29/23 0315 01/29/23 0716 01/29/23 0722 01/29/23 0753  BP: 122/65     Pulse: 63     Resp: 20     Temp: 98.1 F (36.7 C)     TempSrc:      SpO2: 95% 97% 97% 97%  Weight:      Height:       General: Pt is alert, awake, not in acute distress Cardiovascular: normal S1/S2 +, no rubs, no gallops Respiratory: CTA bilaterally, no wheezing, no rhonchi Abdominal: Soft, NT, ND, bowel sounds + Extremities: left lower leg in knee brace    The results of significant diagnostics from this hospitalization (including imaging, microbiology, ancillary and laboratory) are listed below for reference.     Microbiology: Recent Results (from  the past 240 hour(s))  Surgical PCR screen     Status: None   Collection Time: 01/22/23  1:02 AM   Specimen: Nasal Mucosa; Nasal Swab  Result Value Ref Range Status   MRSA, PCR NEGATIVE NEGATIVE Final   Staphylococcus aureus NEGATIVE NEGATIVE Final    Comment: (NOTE) The Xpert SA Assay (FDA approved for NASAL specimens in patients 56 years of age and older), is one component of a comprehensive surveillance program. It is not intended to diagnose infection nor to guide or monitor treatment. Performed at [facility], [address] [place], [place] [address]   SARS Coronavirus 2 by RT PCR (hospital order, performed in [facility] hospital lab) *cepheid single result test* Anterior Nasal Swab     Status: Abnormal   Collection Time: 01/23/23  3:50 PM   Specimen: Anterior Nasal Swab  Result Value Ref Range Status   SARS Coronavirus 2 by RT PCR POSITIVE (A) NEGATIVE Final    Comment: (NOTE) SARS-CoV-2 target nucleic acids are DETECTED  SARS-CoV-2 RNA is generally detectable in upper respiratory specimens  during the acute phase of infection.  Positive results are indicative  of the presence of the identified virus, but do not rule out bacterial infection or co-infection with other pathogens not detected by the test.  Clinical correlation with patient history and  other diagnostic information is necessary to determine patient infection  status.  The expected result is negative.  Fact Sheet for Patients:   https://www.patel.info/   Fact Sheet for Healthcare Providers:   https://hall.com/    This test is not yet approved or cleared by the [place] FDA and  has been authorized for detection and/or diagnosis of SARS-CoV-2 by FDA under an Emergency Use Authorization (EUA).  This EUA will remain in effect (meaning this test can be used) for the duration of  the COVID-19 declaration under Section 564(b)(1)  of the Act, 21 U.S.C. section  360-bbb-3(b)(1), unless the authorization is terminated or revoked sooner.   Performed at [facility], [address] [place], [place] [address]      Labs: BNP (last 3 results) No results for input(s): "BNP" in the last 8760 hours. Basic Metabolic Panel: Recent Labs  Lab 01/23/23 0514 01/24/23 0318 01/25/23 0547 01/26/23 0511 01/27/23 0353 01/28/23 0353 01/29/23 0354  NA 134*   < > 137 136 138 134* 138  K 3.9   < > 3.9 3.7 3.4* 3.7 3.5  CL 101   < > 103 101 101 100 100  CO2 24   < > '27 26 28 27 31  '$ GLUCOSE 149*   < > 204* 230* 169* 153* 117*  BUN 17   < > 21 27* 24* 25* 32*  CREATININE 0.84   < > 0.85 0.82 0.78 0.71 0.88  CALCIUM 8.6*   < > 8.3* 8.4* 8.9 8.5* 8.6*  MG 1.9   < > 2.0 2.0 1.9 1.9 2.1  PHOS 2.4*  --   --   --   --   --   --    < > = values in this interval not displayed.   Liver Function Tests: Recent Labs  Lab 01/25/23 0547 01/26/23 0511 01/27/23 0353 01/28/23 0353 01/29/23 0354  AST 39 39 39 34 23  ALT '17 21 22 24 22  '$ ALKPHOS 43 41 36* 39 39  BILITOT 0.8 0.6 0.3 0.6 0.6  PROT 5.7* 5.8* 5.6* 5.6* 5.7*  ALBUMIN 2.7* 2.6* 2.5* 2.6* 2.6*   No results for input(s): "LIPASE", "AMYLASE" in the last 168 hours. No results for input(s): "AMMONIA" in the last 168 hours. CBC: Recent Labs  Lab 01/25/23 0547 01/26/23 0511 01/27/23 0353 01/28/23 0353 01/29/23 0354  WBC 4.7 9.8 11.0* 11.0* 11.7*  NEUTROABS 3.5 8.0* 9.5* 8.3* 8.8*  HGB 10.8* 11.0* 11.2* 11.4* 11.5*  HCT 33.2* 33.4* 34.4* 34.0* 35.8*  MCV 89.5 89.1 88.9 88.3 90.4  PLT 190 206 215 217 235   Cardiac Enzymes: No results for input(s): "CKTOTAL", "CKMB", "CKMBINDEX", "TROPONINI" in the last 168 hours. BNP: Invalid input(s): "POCBNP" CBG: Recent Labs  Lab 01/28/23 1117 01/28/23 1617 01/28/23 2059 01/29/23 0316 01/29/23 0717  GLUCAP 250* 208* 170* 130* 109*   D-Dimer Recent Labs    01/28/23 0353 01/29/23 0354  DDIMER 1.64* 1.45*   Hgb A1c No results for input(s): "HGBA1C"  in the last 72 hours. Lipid Profile No results for input(s): "CHOL", "HDL", "LDLCALC", "TRIG", "CHOLHDL", "LDLDIRECT" in the last 72 hours. Thyroid function studies No results for input(s): "TSH", "T4TOTAL", "T3FREE", "THYROIDAB" in the last 72 hours.  Invalid input(s): "FREET3" Anemia work up Recent Labs    01/28/23 0353 01/29/23 0354  FERRITIN 613* 550*   Urinalysis    Component Value Date/Time   COLORURINE YELLOW (A) 01/20/2023 1925   APPEARANCEUR CLEAR 01/20/2023 1925   LABSPEC 1.010 01/20/2023 1925   PHURINE 6.0 01/20/2023 1925   GLUCOSEU  NEGATIVE 01/20/2023 1925   HGBUR NEGATIVE 01/20/2023 1925   BILIRUBINUR NEGATIVE 01/20/2023 1925   KETONESUR NEGATIVE 01/20/2023 1925   PROTEINUR NEGATIVE 01/20/2023 1925   NITRITE NEGATIVE 01/20/2023 1925   LEUKOCYTESUR NEGATIVE 01/20/2023 1925   Sepsis Labs Recent Labs  Lab 01/26/23 0511 01/27/23 0353 01/28/23 0353 01/29/23 0354  WBC 9.8 11.0* 11.0* 11.7*   Microbiology Recent Results (from the past 240 hour(s))  Surgical PCR screen     Status: None   Collection Time: 01/22/23  1:02 AM   Specimen: Nasal Mucosa; Nasal Swab  Result Value Ref Range Status   MRSA, PCR NEGATIVE NEGATIVE Final   Staphylococcus aureus NEGATIVE NEGATIVE Final    Comment: (NOTE) The Xpert SA Assay (FDA approved for NASAL specimens in patients 43 years of age and older), is one component of a comprehensive surveillance program. It is not intended to diagnose infection nor to guide or monitor treatment. Performed at [facility], [address], [place], [place] [address]   SARS Coronavirus 2 by RT PCR (hospital order, performed in [facility] hospital lab) *cepheid single result test* Anterior Nasal Swab     Status: Abnormal   Collection Time: 01/23/23  3:50 PM   Specimen: Anterior Nasal Swab  Result Value Ref Range Status   SARS Coronavirus 2 by RT PCR POSITIVE (A) NEGATIVE Final    Comment: (NOTE) SARS-CoV-2 target nucleic acids are  DETECTED  SARS-CoV-2 RNA is generally detectable in upper respiratory specimens  during the acute phase of infection.  Positive results are indicative  of the presence of the identified virus, but do not rule out bacterial infection or co-infection with other pathogens not detected by the test.  Clinical correlation with patient history and  other diagnostic information is necessary to determine patient infection status.  The expected result is negative.  Fact Sheet for Patients:   https://www.patel.info/   Fact Sheet for Healthcare Providers:   https://hall.com/    This test is not yet approved or cleared by the [place] FDA and  has been authorized for detection and/or diagnosis of SARS-CoV-2 by FDA under an Emergency Use Authorization (EUA).  This EUA will remain in effect (meaning this test can be used) for the duration of  the COVID-19 declaration under Section 564(b)(1)  of the Act, 21 U.S.C. section 360-bbb-3(b)(1), unless the authorization is terminated or revoked sooner.   Performed at [facility], [address], [place],  [address]    Time coordinating discharge: 43 mins   SIGNED:  Irwin Brakeman, MD  [facility] 01/29/2023, 8:45 AM How to contact the [facility] Attending or Consulting provider [place] or covering provider during after hours [place], for this patient?  Check the care team in [facility] and look for a) attending/consulting [facility] provider listed and b) the [facility] team listed Log into [email or web address] and use 's universal password to access. If you do not have the password, please contact the hospital operator. Locate the [facility] provider you are looking for under [facility] and page to a number that you can be directly reached. If you still have difficulty reaching the provider, please page the [facility] (Director on Call) for the Hospitalists listed on amion for assistance.

## 2023-01-29 NOTE — TOC Transition Note (Signed)
Transition of Care [facility]) - CM/SW Discharge Note   Patient Details  Name: Michael Evans MRN: [id number] Date of Birth: [birth year]  Transition of Care [facility]) CM/SW Contact:  Shade Flood, LCSW Phone Number: 01/29/2023, 9:22 AM   Clinical Narrative:     Pt has auth for transfer to SNF at [facility] today. Updated Kerri at [facility] and they can admit.   DC clinical sent electronically. RN to call report.   HIPAA compliant VMM left for pt's sister to update. There are no other TOC needs for dc.  Final next level of care: Skilled Nursing Facility Barriers to Discharge: Barriers Resolved   Patient Goals and CMS Choice CMS Medicare.gov Compare Post Acute Care list provided to:: Patient Choice offered to / list presented to : Patient  Discharge Placement                Patient chooses bed at: [facility] Patient to be transferred to facility by: w/c Name of family member notified: Christine Patient and family notified of of transfer: 01/29/23  Discharge Plan and Services Additional resources added to the After Visit Summary for   In-house Referral: Clinical Social Work   Post Acute Care Choice: [facility]                               Social Determinants of Health ([place]) Interventions SDOH Screenings   Food Insecurity: No Food Insecurity (01/20/2023)  Housing: Low Risk  (01/20/2023)  Transportation Needs: No Transportation Needs (01/20/2023)  Utilities: Not At Risk (01/20/2023)  Alcohol Screen: Low Risk  (02/07/2022)  Depression (PHQ2-9): Low Risk  (10/16/2022)  Financial Resource Strain: Low Risk  (02/07/2022)  Physical Activity: Insufficiently Active (02/07/2022)  Social Connections: Socially Isolated (02/07/2022)  Stress: No Stress Concern Present (02/07/2022)  Tobacco Use: Medium Risk (01/29/2023)     Readmission Risk Interventions    01/23/2023    1:13 PM  Readmission Risk Prevention Plan  Medication Screening Complete  Transportation Screening  Complete

## 2023-01-29 NOTE — Discharge Instructions (Signed)
IMPORTANT INFORMATION: PAY CLOSE ATTENTION   PHYSICIAN DISCHARGE INSTRUCTIONS  Follow with Primary care provider  Pickard, Warren T, MD  and other consultants as instructed by your Hospitalist Physician  SEEK MEDICAL CARE OR RETURN TO EMERGENCY ROOM IF SYMPTOMS COME BACK, WORSEN OR NEW PROBLEM DEVELOPS   Please note: You were cared for by a hospitalist during your hospital stay. Every effort will be made to forward records to your primary care provider.  You can request that your primary care provider send for your hospital records if they have not received them.  Once you are discharged, your primary care physician will handle any further medical issues. Please note that NO REFILLS for any discharge medications will be authorized once you are discharged, as it is imperative that you return to your primary care physician (or establish a relationship with a primary care physician if you do not have one) for your post hospital discharge needs so that they can reassess your need for medications and monitor your lab values.  Please get a complete blood count and chemistry panel checked by your Primary MD at your next visit, and again as instructed by your Primary MD.  Get Medicines reviewed and adjusted: Please take all your medications with you for your next visit with your Primary MD  Laboratory/radiological data: Please request your Primary MD to go over all hospital tests and procedure/radiological results at the follow up, please ask your primary care provider to get all Hospital records sent to his/her office.  In some cases, they will be blood work, cultures and biopsy results pending at the time of your discharge. Please request that your primary care provider follow up on these results.  If you are diabetic, please bring your blood sugar readings with you to your follow up appointment with primary care.    Please call and make your follow up appointments as soon as possible.    Also  Note the following: If you experience worsening of your admission symptoms, develop shortness of breath, life threatening emergency, suicidal or homicidal thoughts you must seek medical attention immediately by calling 911 or calling your MD immediately  if symptoms less severe.  You must read complete instructions/literature along with all the possible adverse reactions/side effects for all the Medicines you take and that have been prescribed to you. Take any new Medicines after you have completely understood and accpet all the possible adverse reactions/side effects.   Do not drive when taking Pain medications or sleeping medications (Benzodiazepines)  Do not take more than prescribed Pain, Sleep and Anxiety Medications. It is not advisable to combine anxiety,sleep and pain medications without talking with your primary care practitioner  Special Instructions: If you have smoked or chewed Tobacco  in the last 2 yrs please stop smoking, stop any regular Alcohol  and or any Recreational drug use.  Wear Seat belts while driving.  Do not drive if taking any narcotic, mind altering or controlled substances or recreational drugs or alcohol.       

## 2023-01-30 ENCOUNTER — Non-Acute Institutional Stay (SKILLED_NURSING_FACILITY): Payer: 59 | Admitting: Adult Health

## 2023-01-30 ENCOUNTER — Encounter: Payer: Self-pay | Admitting: Adult Health

## 2023-01-30 DIAGNOSIS — I10 Essential (primary) hypertension: Secondary | ICD-10-CM

## 2023-01-30 DIAGNOSIS — K219 Gastro-esophageal reflux disease without esophagitis: Secondary | ICD-10-CM

## 2023-01-30 DIAGNOSIS — R739 Hyperglycemia, unspecified: Secondary | ICD-10-CM

## 2023-01-30 DIAGNOSIS — D62 Acute posthemorrhagic anemia: Secondary | ICD-10-CM | POA: Insufficient documentation

## 2023-01-30 DIAGNOSIS — J9611 Chronic respiratory failure with hypoxia: Secondary | ICD-10-CM

## 2023-01-30 DIAGNOSIS — E43 Unspecified severe protein-calorie malnutrition: Secondary | ICD-10-CM

## 2023-01-30 DIAGNOSIS — N401 Enlarged prostate with lower urinary tract symptoms: Secondary | ICD-10-CM

## 2023-01-30 DIAGNOSIS — S82042S Displaced comminuted fracture of left patella, sequela: Secondary | ICD-10-CM | POA: Diagnosis not present

## 2023-01-30 DIAGNOSIS — D509 Iron deficiency anemia, unspecified: Secondary | ICD-10-CM | POA: Insufficient documentation

## 2023-01-30 DIAGNOSIS — J439 Emphysema, unspecified: Secondary | ICD-10-CM

## 2023-01-30 DIAGNOSIS — I700 Atherosclerosis of aorta: Secondary | ICD-10-CM | POA: Insufficient documentation

## 2023-01-30 DIAGNOSIS — T380X5A Adverse effect of glucocorticoids and synthetic analogues, initial encounter: Secondary | ICD-10-CM

## 2023-01-30 DIAGNOSIS — K5909 Other constipation: Secondary | ICD-10-CM

## 2023-01-30 DIAGNOSIS — N138 Other obstructive and reflux uropathy: Secondary | ICD-10-CM | POA: Insufficient documentation

## 2023-01-30 NOTE — Progress Notes (Signed)
Location:  [facility] Room Number: 133 P Place of Service:  SNF (31)   CODE STATUS: FULL CODE  Allergies  Allergen Reactions   Benzyl Alcohol Itching   Amlodipine Swelling    Swelling in feet   Aspirin Other (See Comments)    Upset stomach.   Other Hypertension    Hazelnuts   Penicillins Hives    Has patient had a PCN reaction causing immediate rash, facial/tongue/throat swelling, SOB or lightheadedness with hypotension: Yes Has patient had a PCN reaction causing severe rash involving mucus membranes or skin necrosis: No Has patient had a PCN reaction that required hospitalization No Has patient had a PCN reaction occurring within the last 10 years: No If all of the above answers are "NO", then may proceed with Cephalosporin use.    Sulfa Antibiotics Hives   Tylenol [Acetaminophen] Other (See Comments)    Pt reports he has had liver issues and was told not to take tylenol     Chief Complaint  Patient presents with   Hospitalization [facility] follow up    HPI:  He is a 82 year old man who has been hospitalized from 01-20-23 through 01-29-23. His medical history included: COPD with chronic respiratory failure on 02; hypertension; BPH. He presented to the ED after falling 7 feet off a ladder. He suffered a comminuted fracture of left patella: is status post ORIF of left patella on 01-22-23. He developed COVID and has been treated for cough and wheezing. He is on a 10 day coarse of prednisone for which he needs 3 more days. He is here for short term rehab with his goal to return back home. He will continue to be followed for his chronic illnesses including:   Aortic atherosclerosis  Essential hypertension:Pulmonary emphysema unspecified emphysema chronic respiratory failure with hypoxia    Past Medical History:  Diagnosis Date   Arthritis    Chronic rhinitis    Complication of anesthesia    patient usually hs to be intubated with anesthesia    COPD (chronic obstructive pulmonary disease) ([place])    PFT 06-26-09 FEV1  1.75( 71%), FVC 3.65( 101%), FEV1% 48, TLC 5.53(104%), DLCO 55%, no BD   Dyspnea    Hypertension    Nasal septal deviation    Nocturia    On home O2    2L N/C   OSA (obstructive sleep apnea) 04/21/2011   Skin cancer     Past Surgical History:  Procedure Laterality Date   APPENDECTOMY     BACK SURGERY     CATARACT EXTRACTION W/PHACO  01/06/2013   Procedure: CATARACT EXTRACTION PHACO AND INTRAOCULAR LENS PLACEMENT ([place]);  Surgeon: Tonny Branch, MD;  Location: [facility];  Service: Ophthalmology;  Laterality: Right;  CDE: 22.17   CATARACT EXTRACTION W/PHACO Left 06/20/2022   Procedure: CATARACT EXTRACTION PHACO AND INTRAOCULAR LENS PLACEMENT (I[facility]);  Surgeon: Baruch Goldmann, MD;  Location: [facility];  Service: Ophthalmology;  Laterality: Left;  CDE 20.85   FEMORAL HERNIA REPAIR Right 11/19/2022   Procedure: HERNIA REPAIR FEMORAL WITH MESH;  Surgeon: [name], MD;  Location: [facility];  Service: General;  Laterality: Right;   INGUINAL HERNIA REPAIR Left 12/09/2021   Procedure: HERNIA REPAIR INGUINAL ADULT WITH MESH;  Surgeon: [name], MD;  Location: [facility];  Service: General;  Laterality: Left;   INGUINAL HERNIA REPAIR Right 03/19/2022   Procedure: HERNIA REPAIR INGUINAL ADULT WITH MESH;  Surgeon: [name], MD;  Location: [facility];  Service: General;  Laterality: Right;   ORIF PATELLA Left 01/22/2023   Procedure: OPEN REDUCTION INTERNAL FIXATION (ORIF) PATELLA;  Surgeon: Mordecai Rasmussen, MD;  Location: [facility];  Service: Orthopedics;  Laterality: Left;    Social History   Socioeconomic History   Marital status: Divorced    Spouse name: Not on file   Number of children: 3   Years of education: Not on file   Highest education level: Not on file  Occupational History   Occupation: Neurosurgeon: RETIRED  Tobacco Use   Smoking status: Former    Packs/day: 1.50    Years: 54.00    Total pack  years: 81.00    Types: Cigarettes    Quit date: 12/01/2001    Years since quitting: 21.1   Smokeless tobacco: Never  Vaping Use   Vaping Use: Never used  Substance and Sexual Activity   Alcohol use: No   Drug use: No   Sexual activity: Not on file  Other Topics Concern   Not on file  Social History Narrative   Lives with sister.    Social Determinants of Health   Financial Resource Strain: Low Risk  (02/07/2022)   Overall Financial Resource Strain (CARDIA)    Difficulty of Paying Living Expenses: Not hard at all  Food Insecurity: No Food Insecurity (01/20/2023)   Hunger Vital Sign    Worried About Running Out of Food in the Last Year: Never true    Ran Out of Food in the Last Year: Never true  Transportation Needs: No Transportation Needs (01/20/2023)   PRAPARE - Hydrologist (Medical): No    Lack of Transportation (Non-Medical): No  Physical Activity: Insufficiently Active (02/07/2022)   Exercise Vital Sign    Days of Exercise per Week: 3 days    Minutes of Exercise per Session: 20 min  Stress: No Stress Concern Present (02/07/2022)   [place]    Feeling of Stress : Not at all  Social Connections: Socially Isolated (02/07/2022)   Social Connection and Isolation Panel [NHANES]    Frequency of Communication with Friends and Family: More than three times a week    Frequency of Social Gatherings with Friends and Family: More than three times a week    Attends Religious Services: Never    Marine scientist or Organizations: No    Attends Archivist Meetings: Never    Marital Status: Divorced  Human resources officer Violence: Not At Risk (01/20/2023)   Humiliation, Afraid, Rape, and Kick questionnaire    Fear of Current or Ex-Partner: No    Emotionally Abused: No    Physically Abused: No    Sexually Abused: No   Family History  Problem Relation Age of Onset   Lung cancer  Mother       VITAL SIGNS BP 138/78   Pulse 82   Temp 97.7 F (36.5 C)   Resp 20   Ht '5\' 5"'$  (1.651 m)   Wt 128 lb 6.4 oz (58.2 kg)   SpO2 94%   BMI 21.37 kg/m   Outpatient Encounter Medications as of 01/30/2023  Medication Sig   albuterol (VENTOLIN HFA) 108 (90 Base) MCG/ACT inhaler Inhale 2 puffs into the lungs every 6 (six) hours as needed for wheezing or shortness of breath.   ascorbic acid (VITAMIN C) 500 MG tablet Take 1,000 mg by mouth daily.  aspirin EC 81 MG tablet Take 1 tablet (81 mg total) by mouth 2 (two) times daily for 14 days. Swallow whole.   Budeson-Glycopyrrol-Formoterol (BREZTRI AEROSPHERE) 160-9-4.8 MCG/ACT AERO Inhale 2 puffs into the lungs in the morning and at bedtime.   cholecalciferol (VITAMIN D) 25 MCG (1000 UNIT) tablet Take 3,000 Units by mouth daily.   cyanocobalamin 1000 MCG tablet Take 2,000 mcg by mouth daily.   feeding supplement (ENSURE ENLIVE / ENSURE PLUS) LIQD Take 237 mLs by mouth 3 (three) times daily between meals.   finasteride (PROSCAR) 5 MG tablet Take 1 tablet (5 mg total) by mouth daily.   guaiFENesin-dextromethorphan (ROBITUSSIN DM) 100-10 MG/5ML syrup Take 10 mLs by mouth every 4 (four) hours as needed for cough.   ipratropium-albuterol (DUONEB) 0.5-2.5 (3) MG/3ML SOLN Take 3 mLs by nebulization every 6 (six) hours as needed (wheezing, shortness of breath).   Multiple Vitamin (MULTIVITAMIN) tablet Take 1 tablet by mouth daily. Men's health   Nebulizer MISC 1 each by Does not apply route daily as needed. PLEASE PROVIDE NEBULIZER KIT TO INCLUDE TUBING/MASK/MOUTHPIECE   Omega-3 Fatty Acids (FISH OIL CONCENTRATE PO) Take 1,000 mg by mouth daily.   oxyCODONE (OXY IR/ROXICODONE) 5 MG immediate release tablet Take 5 mg by mouth every 6 (six) hours as needed for severe pain. Pain 8-10 out of 10   pantoprazole (PROTONIX) 40 MG tablet Take 1 tablet (40 mg total) by mouth daily.   polyethylene glycol (MIRALAX / GLYCOLAX) 17 g packet Take 17 g by  mouth daily.   predniSONE (DELTASONE) 20 MG tablet Take 1 tablet (20 mg total) by mouth daily with breakfast for 3 days.   pyridoxine (B-6) 100 MG tablet Take 100 mg by mouth daily.   tamsulosin (FLOMAX) 0.4 MG CAPS capsule Take 2 capsules (0.8 mg total) by mouth daily.   zinc sulfate 220 (50 Zn) MG capsule Take 1 capsule (220 mg total) by mouth daily.   [DISCONTINUED] albuterol (VENTOLIN HFA) 108 (90 Base) MCG/ACT inhaler INHALE 1-2 PUFFS BY MOUTH EVERY 6 HOURS AS NEEDED FOR WHEEZE OR SHORTNESS OF BREATH (Patient taking differently: Inhale 1 puff into the lungs every 6 (six) hours as needed for wheezing or shortness of breath.)   [DISCONTINUED] oxyCODONE (ROXICODONE) 5 MG immediate release tablet Take 1 tablet (5 mg total) by mouth every 4 (four) hours as needed for severe pain or breakthrough pain. (Patient taking differently: Take 5 mg by mouth every 6 (six) hours as needed for severe pain or breakthrough pain.)   No facility-administered encounter medications on file as of 01/30/2023.     SIGNIFICANT DIAGNOSTIC EXAMS  LABS REVIEWED;   01-20-23; wbc 7.9 hgb 15.0; hct 45.6; mcv 89.2 plt 246; glucose 118 bun 31; creat 0.96; k+ 4.2; na++ 140; ca 10.4; gfr >60; protein 7.0; albumin 3.9 01-23-23; wbc 13.0; hgb 11.3; hct 34.0; mcv 90.7 plt 166;glucose 149; bun 17; creat 0.84; k+ 3.9; na++ 134; ca 8.6; gfr >60; phos 2.4 albumin 2.7; mag 1.9; vitamin B12: 837; folate 15.5; iron 19; tibc 320; ferritin 72.  01-25-23: hgb A1c 6.8 01-27-23: wbc 11.0; hgb 11.2; hct 34.4; mcv 88.9 plt 215; glucose 169; bun 24; creat 0.72; k+ 3.4; na++ 138; ca 8.9; gfr >60; protein 5.6; albumin 2.5; mag 1.9  Review of Systems  Constitutional:  Negative for malaise/fatigue.  Respiratory:  Negative for cough and shortness of breath.   Cardiovascular:  Negative for chest pain, palpitations and leg swelling.  Gastrointestinal:  Negative for abdominal pain, constipation and  heartburn.  Musculoskeletal:  Negative for back pain,  joint pain and myalgias.  Skin: Negative.   Neurological:  Negative for dizziness.  Psychiatric/Behavioral:  The patient is not nervous/anxious.    Physical Exam Constitutional:      General: He is not in acute distress.    Appearance: He is well-developed. He is not diaphoretic.  Neck:     Thyroid: No thyromegaly.  Cardiovascular:     Rate and Rhythm: Normal rate and regular rhythm.     Heart sounds: Normal heart sounds.  Pulmonary:     Effort: Pulmonary effort is normal. No respiratory distress.     Breath sounds: Wheezing and rhonchi present.  Abdominal:     General: Bowel sounds are normal. There is no distension.     Palpations: Abdomen is soft.     Tenderness: There is no abdominal tenderness.  Musculoskeletal:        General: Normal range of motion.     Cervical back: Neck supple.     Comments: Left knee in splint with ace wrap   Lymphadenopathy:     Cervical: No cervical adenopathy.  Skin:    General: Skin is warm and dry.  Neurological:     Mental Status: He is alert. Mental status is at baseline.  Psychiatric:        Mood and Affect: Mood normal.       ASSESSMENT/ PLAN:  TODAY  Closed displaced comminuted fracture of left patella sequela: will continue therapy as directed. Will follow up with orthopedics as indicated. Will continue oxycodone 5 mg every 6 hours as needed   2. Aortic atherosclerosis (ct 05-23-22)   3. Essential hypertension: b/p 138/78 is on asa 81 mg daily   4. Pulmonary emphysema unspecified emphysema/chronic respiratory failure with hypoxia : has 81 pack year history   will continue breztri aerosphere 160-9-4.8 mcg 2 puffs twice daily albuterol neb or 2 puffs every 6 hours as needed. Needs total of 3 days of prednisone 20 mg daily to complete taper.   5. Gastroesophageal reflux disease without esophagitis: will continue protonix 40 mg daily   6. BPH with obstruction/urinary symptoms: will continue proscar 5 mg daily and flomax 0.8 mg  daily   7. Protein calorie malnutrition severe: protein 5.6 albumin 2.5 will continue ensure three times daily and will begin prostat 30 mL three times daily   8. Chronic constipation: will continue miralax daily   9. Acute blood loss anemia/iron deficiency anemia: hgb 11.2 iron 19 will begin iron three days weekly; is status post iron infusion at hospital.   10. Hyperglycemia steroid induced: hgb A1c is 6.8; at this time will monitor cbg readings and will treat as indicated in the future. Will check cbg readings daily.      Ok Edwards NP [facility]  call [phone]

## 2023-02-02 ENCOUNTER — Non-Acute Institutional Stay (SKILLED_NURSING_FACILITY): Payer: 59 | Admitting: Internal Medicine

## 2023-02-02 ENCOUNTER — Encounter: Payer: Self-pay | Admitting: Internal Medicine

## 2023-02-02 DIAGNOSIS — J9611 Chronic respiratory failure with hypoxia: Secondary | ICD-10-CM | POA: Diagnosis not present

## 2023-02-02 DIAGNOSIS — S82042S Displaced comminuted fracture of left patella, sequela: Secondary | ICD-10-CM

## 2023-02-02 DIAGNOSIS — U071 COVID-19: Secondary | ICD-10-CM | POA: Diagnosis not present

## 2023-02-02 DIAGNOSIS — E43 Unspecified severe protein-calorie malnutrition: Secondary | ICD-10-CM

## 2023-02-02 DIAGNOSIS — R739 Hyperglycemia, unspecified: Secondary | ICD-10-CM

## 2023-02-02 DIAGNOSIS — S82042A Displaced comminuted fracture of left patella, initial encounter for closed fracture: Secondary | ICD-10-CM | POA: Diagnosis not present

## 2023-02-02 DIAGNOSIS — I10 Essential (primary) hypertension: Secondary | ICD-10-CM

## 2023-02-02 DIAGNOSIS — D62 Acute posthemorrhagic anemia: Secondary | ICD-10-CM

## 2023-02-02 DIAGNOSIS — J1282 Pneumonia due to coronavirus disease 2019: Secondary | ICD-10-CM | POA: Insufficient documentation

## 2023-02-02 DIAGNOSIS — T380X5A Adverse effect of glucocorticoids and synthetic analogues, initial encounter: Secondary | ICD-10-CM

## 2023-02-02 NOTE — Assessment & Plan Note (Signed)
By history he had peripheral edema with the vasodilating calcium channel blocker.  Low-dose ARB will be initiated with blood pressure monitor.

## 2023-02-02 NOTE — Assessment & Plan Note (Addendum)
Current albumin 2.6 and total protein 5.7.  He describes chronic weakness in the upper extremities.  Limb atrophy is present, especially of the lower extremities.  Nutritionist to consult.

## 2023-02-02 NOTE — Assessment & Plan Note (Signed)
On physical he has a silent lungs advanced COPD/emphysema.  Cough is intermittently productive of thick yellow sputum.  Doxycycline will be ordered for 7 days with repeat chest x-ray based on clinical response.

## 2023-02-02 NOTE — Progress Notes (Incomplete)
NURSING HOME LOCATION:  [facility] ROOM NUMBER:  133 P  CODE STATUS: Full Code  PCP: Jenna Luo MD  This is a comprehensive admission note to this SNFperformed on this date less than 30 days from date of admission. Included are preadmission medical/surgical history; reconciled medication list; family history; social history and comprehensive review of systems.  Corrections and additions to the records were documented. Comprehensive physical exam was also performed. Additionally a clinical summary was entered for each active diagnosis pertinent to this admission in the Problem List to enhance continuity of care.  HPI: He was hospitalized 2/20 - 01/29/2023 with comminuted fracture of the left patella sustained in a mechanical 7 foot fall from a ladder.  Apparently the patient was trying to remove a bird's nest when he lost his balance and fell.  He denied any cardiac or neurologic prodrome prior to the fall.Dr. Amedeo Kinsman completed ORIF 2/22.  Postop VTE prophylaxis was to be with 81 mg of aspirin twice daily beginning 2/23.  PPI was added for GI protection.  PT/OT consulted and recommended SNF placement for rehab as the patient's only support at home is a sister with health issues. Hospital course was complicated by COVID infection associated with bilateral increases in perihilar and bibasilar markings suggesting early infiltrates superimposed on O2 dependent COPD.  He was symptomatic with cough and wheezing.  D-dimer peak was 2.12  with a final value of 1.45.  C-reactive protein peak was 12.5 with a final value of 1.1.  He received Paxlovid and IV Solu-Medrol.  The latter was transitioned to prednisone for total of 10 days. Iron deficiency anemia was present and he received IV iron followed by oral supplementation.  Preop H/H was 15/45.6.  Postop nadir H/H was 10.5/32.3 with normochromic, normocytic indices.  Protein/caloric malnutrition was present as evidenced by an albumin of  2.6 and total protein of 5.7. While hospitalized glucoses ranged 102 up to 230 and he received sliding scale insulin.  A1c was 6.8%. Clinically he was stable & PT/OT recommended SNF placement for rehab.  Past medical and surgical history: Includes degenerative joint disease, oxygen dependent emphysema, essential hypertension, OSA and history of skin cancer. Surgeries include femoral and inguinal hernia repairs as well as back surgery.  Social history: Nondrinker; former smoker with total consumption of 81 pack years by history.  Family history: Noncontributory due to advanced age.   Review of systems: He does confirm that there was no neurologic or cardiac prodrome prior to the fall from the ladder but that he simply lost his balance.  He states that he is normally not employing a ladder.  He is on home O2 but left the oxygen at the base of the latter before climbing up.  He is aware that he had the fracture stating "my kneecap bursted."  Initially he did not validate having had COVID until I mentioned it.  He was not vaccinated PTA.  He does describe the production of yellow phlegm in significant amounts.His major concern is weakness in the upper extremities.  He requests "something to strengthen up."  He denies any bleeding dyscrasias.  Constitutional: No fever, significant weight change  Eyes: No redness, discharge, pain, vision change ENT/mouth: No nasal congestion, purulent discharge, earache, change in hearing, sore throat  Cardiovascular: No chest pain, palpitations, paroxysmal nocturnal dyspnea, claudication, edema  Respiratory: No hemoptysis,significant snoring, apnea  Gastrointestinal: No heartburn, dysphagia, abdominal pain, nausea /vomiting, rectal bleeding, melena, change in bowels Genitourinary: No dysuria, hematuria, pyuria,  incontinence, nocturia Dermatologic: No rash, pruritus, change in appearance of skin Neurologic: No dizziness, headache, syncope, seizures, numbness,  tingling Psychiatric: No significant anxiety, depression, insomnia, anorexia Endocrine: No change in hair/skin/nails, excessive thirst, excessive hunger, excessive urination  Hematologic/lymphatic: No significant bruising, lymphadenopathy, abnormal bleeding Allergy/immunology: No itchy/watery eyes, significant sneezing, urticaria, angioedema  Physical exam:  Pertinent or positive findings: He appears his age and somewhat chronically ill.  His full head of hair is disheveled and there is pattern alopecia over the crown.  Facies tend to be somewhat blank.  He is on nasal oxygen.  He is completely edentulous.  There is a small abrasion over the lower lip medially.  He exhibits a raspy nonproductive cough with intermittent production of yellow sputum.  Chest is silent except for isolated expiratory wheezes on the left anteriorly.  Heart sounds are distant. Pedal pulses are not palpable.  Clubbing of nailbeds is present.  He has atrophy of the limbs especially in the lower limbs. LLE is wrapped & in a brace. He has isolated bruising over the forearms. The R ear appears hyperemic. Although he denied anxiety or depression; he became teary-eyed when his sister and her stepson visited him.  General appearance: no acute distress, increased work of breathing is present.   Lymphatic: No lymphadenopathy about the head, neck, axilla. Eyes: No conjunctival inflammation or lid edema is present. There is no scleral icterus. Ears:  External ear exam shows no significant lesions or deformities.   Nose:  External nasal examination shows no deformity or inflammation. Nasal mucosa are pink and moist without lesions, exudates Oral exam: Lips and gums are healthy appearing.There is no oropharyngeal erythema or exudate. Neck:  No thyromegaly, masses, tenderness noted.    Heart:  No gallop, murmur, click, rub present.  Lungs:As noted without rhonchi, rales, rubs. Abdomen: Bowel sounds are normal.  Abdomen is soft and  nontender with no organomegaly, hernias, masses. GU: Deferred  Extremities:  No cyanosis, edema. Neurologic exam: Balance, Rhomberg, finger to nose testing could not be completed due to clinical state Skin warm & dry.No significant lesions or rash present.  See clinical summary under each active problem in the Problem List with associated updated therapeutic plan

## 2023-02-02 NOTE — Patient Instructions (Signed)
See assessment and plan under each diagnosis in the problem list and acutely for this visit 

## 2023-02-02 NOTE — Assessment & Plan Note (Signed)
O2 sats are adequate on 2 L of nasal oxygen.  He has classic findings of emphysema with silent chest.  He does have a cough which is for the most part nonproductive but intermittently productive of thick yellow material.  A 7-day course of doxycycline will be prescribed for the acute bronchitis in the context of advanced oxygen dependent COPD/emphysema.

## 2023-02-02 NOTE — Assessment & Plan Note (Signed)
ORIF 01/22/2023 for comminuted fracture of the left patella.  Preop H/H 15/45.6.  Postop nadir H/H 10.5/30.3.  Serum iron and iron saturation low.  He received IV iron and was transitioned to oral supplement.  Monitor H/H at SNF.

## 2023-02-02 NOTE — Assessment & Plan Note (Signed)
While hospitalized with comminuted fracture of the left patella 2/20 - 01/29/2023 glucoses ranged from 102 up to 230.  Sliding scale insulin employed.  A1c was 6.8%.  Monitor fasting blood sugar at SNF as steroids weaned and discontinued.

## 2023-02-02 NOTE — Assessment & Plan Note (Signed)
Orthopedic follow-up as scheduled.  PT/OT at SNF as tolerated.

## 2023-02-04 ENCOUNTER — Encounter: Payer: Self-pay | Admitting: Orthopedic Surgery

## 2023-02-04 ENCOUNTER — Encounter: Payer: Self-pay | Admitting: Adult Health

## 2023-02-04 ENCOUNTER — Non-Acute Institutional Stay (SKILLED_NURSING_FACILITY): Payer: 59 | Admitting: Adult Health

## 2023-02-04 ENCOUNTER — Ambulatory Visit (INDEPENDENT_AMBULATORY_CARE_PROVIDER_SITE_OTHER): Payer: 59

## 2023-02-04 ENCOUNTER — Ambulatory Visit (INDEPENDENT_AMBULATORY_CARE_PROVIDER_SITE_OTHER): Payer: 59 | Admitting: Orthopedic Surgery

## 2023-02-04 ENCOUNTER — Other Ambulatory Visit: Payer: Self-pay | Admitting: Adult Health

## 2023-02-04 DIAGNOSIS — S82042S Displaced comminuted fracture of left patella, sequela: Secondary | ICD-10-CM

## 2023-02-04 DIAGNOSIS — S82042A Displaced comminuted fracture of left patella, initial encounter for closed fracture: Secondary | ICD-10-CM | POA: Diagnosis not present

## 2023-02-04 DIAGNOSIS — S82042D Displaced comminuted fracture of left patella, subsequent encounter for closed fracture with routine healing: Secondary | ICD-10-CM

## 2023-02-04 MED ORDER — TRAMADOL HCL 50 MG PO TABS: 25.0000 mg | ORAL_TABLET | Freq: Four times a day (QID) | ORAL | 0 refills | Status: AC

## 2023-02-04 NOTE — Progress Notes (Signed)
Location:  [facility] Room Number: 133- P Place of Service:  SNF (31)   CODE STATUS: Full Code   Allergies  Allergen Reactions   Benzyl Alcohol Itching   Amlodipine Swelling    Swelling in feet   Aspirin Other (See Comments)    Upset stomach.   Other Hypertension    Hazelnuts   Penicillins Hives    Has patient had a PCN reaction causing immediate rash, facial/tongue/throat swelling, SOB or lightheadedness with hypotension: Yes Has patient had a PCN reaction causing severe rash involving mucus membranes or skin necrosis: No Has patient had a PCN reaction that required hospitalization No Has patient had a PCN reaction occurring within the last 10 years: No If all of the above answers are "NO", then may proceed with Cephalosporin use.    Sulfa Antibiotics Hives   Tylenol [Acetaminophen] Other (See Comments)    Pt reports he has had liver issues and was told not to take tylenol     Chief Complaint  Patient presents with   Acute Visit    Pain management     HPI:  He had been on oxycodone for his pain related to his patella fracture. He states that the pain is 8/10 with oxycodone. This medication has ran out. We have had a prolonged discussion with regards to his pain management issues. We are not restarting the oxycodone but will start tramadol. He is unable to take tylenol.   Past Medical History:  Diagnosis Date   Arthritis    Chronic rhinitis    Complication of anesthesia    patient usually hs to be intubated with anesthesia   COPD (chronic obstructive pulmonary disease) ([place])    PFT 06-26-09 FEV1  1.75( 71%), FVC 3.65( 101%), FEV1% 48, TLC 5.53(104%), DLCO 55%, no BD   Dyspnea    Hypertension    Nasal septal deviation    Nocturia    On home O2    2L N/C   OSA (obstructive sleep apnea) 04/21/2011   Partial small bowel obstruction ([place]) 10/18/2021   Skin cancer     Past Surgical History:  Procedure Laterality Date   APPENDECTOMY     BACK  SURGERY     CATARACT EXTRACTION W/PHACO  01/06/2013   Procedure: CATARACT EXTRACTION PHACO AND INTRAOCULAR LENS PLACEMENT ([place]);  Surgeon: Tonny Branch, MD;  Location: [facility];  Service: Ophthalmology;  Laterality: Right;  CDE: 22.17   CATARACT EXTRACTION W/PHACO Left 06/20/2022   Procedure: CATARACT EXTRACTION PHACO AND INTRAOCULAR LENS PLACEMENT ([facility]);  Surgeon: Baruch Goldmann, MD;  Location: [facility];  Service: Ophthalmology;  Laterality: Left;  CDE 20.85   FEMORAL HERNIA REPAIR Right 11/19/2022   Procedure: HERNIA REPAIR FEMORAL WITH MESH;  Surgeon: [name], MD;  Location: [facility];  Service: General;  Laterality: Right;   INGUINAL HERNIA REPAIR Left 12/09/2021   Procedure: HERNIA REPAIR INGUINAL ADULT WITH MESH;  Surgeon: [name], MD;  Location: [facility];  Service: General;  Laterality: Left;   INGUINAL HERNIA REPAIR Right 03/19/2022   Procedure: HERNIA REPAIR INGUINAL ADULT WITH MESH;  Surgeon: [name], MD;  Location: [facility];  Service: General;  Laterality: Right;   ORIF PATELLA Left 01/22/2023   Procedure: OPEN REDUCTION INTERNAL FIXATION (ORIF) PATELLA;  Surgeon: Mordecai Rasmussen, MD;  Location: [facility];  Service: Orthopedics;  Laterality: Left;    Social History   Socioeconomic History   Marital status: Divorced    Spouse name: Not  on file   Number of children: 3   Years of education: Not on file   Highest education level: Not on file  Occupational History   Occupation: Neurosurgeon: RETIRED  Tobacco Use   Smoking status: Former    Packs/day: 1.50    Years: 54.00    Total pack years: 81.00    Types: Cigarettes    Quit date: 12/01/2001    Years since quitting: 21.1   Smokeless tobacco: Never  Vaping Use   Vaping Use: Never used  Substance and Sexual Activity   Alcohol use: No   Drug use: No   Sexual activity: Not on file  Other Topics Concern   Not on file  Social History Narrative   Lives with sister.    Social Determinants of Health    Financial Resource Strain: Low Risk  (02/07/2022)   Overall Financial Resource Strain (CARDIA)    Difficulty of Paying Living Expenses: Not hard at all  Food Insecurity: No Food Insecurity (01/20/2023)   Hunger Vital Sign    Worried About Running Out of Food in the Last Year: Never true    Ran Out of Food in the Last Year: Never true  Transportation Needs: No Transportation Needs (01/20/2023)   PRAPARE - Hydrologist (Medical): No    Lack of Transportation (Non-Medical): No  Physical Activity: Insufficiently Active (02/07/2022)   Exercise Vital Sign    Days of Exercise per Week: 3 days    Minutes of Exercise per Session: 20 min  Stress: No Stress Concern Present (02/07/2022)   [place]    Feeling of Stress : Not at all  Social Connections: Socially Isolated (02/07/2022)   Social Connection and Isolation Panel [NHANES]    Frequency of Communication with Friends and Family: More than three times a week    Frequency of Social Gatherings with Friends and Family: More than three times a week    Attends Religious Services: Never    Marine scientist or Organizations: No    Attends Archivist Meetings: Never    Marital Status: Divorced  Human resources officer Violence: Not At Risk (01/20/2023)   Humiliation, Afraid, Rape, and Kick questionnaire    Fear of Current or Ex-Partner: No    Emotionally Abused: No    Physically Abused: No    Sexually Abused: No   Family History  Problem Relation Age of Onset   Lung cancer Mother       VITAL SIGNS BP 114/68   Temp 98.1 F (36.7 C)   Resp 20   Ht '5\' 5"'$  (1.651 m)   Wt 128 lb 6.4 oz (58.2 kg)   SpO2 91%   BMI 21.37 kg/m   Outpatient Encounter Medications as of 02/04/2023  Medication Sig   albuterol (VENTOLIN HFA) 108 (90 Base) MCG/ACT inhaler Inhale 2 puffs into the lungs every 6 (six) hours as needed for wheezing or shortness of  breath.   Albuterol Sulfate 2.5 MG/0.5ML NEBU Inhale 1 each into the lungs 3 (three) times daily.   Amino Acids-Protein Hydrolys (FEEDING SUPPLEMENT, PRO-STAT SUGAR FREE 64,) LIQD Take 30 mLs by mouth 3 (three) times daily with meals.   ascorbic acid (VITAMIN C) 500 MG tablet Take 1,000 mg by mouth daily.   aspirin EC 81 MG tablet Take 1 tablet (81 mg total) by mouth 2 (two) times daily for 14 days. Swallow whole.  Budeson-Glycopyrrol-Formoterol (BREZTRI AEROSPHERE) 160-9-4.8 MCG/ACT AERO Inhale 2 puffs into the lungs in the morning and at bedtime.   cholecalciferol (VITAMIN D) 25 MCG (1000 UNIT) tablet Take 3,000 Units by mouth daily.   cyanocobalamin 1000 MCG tablet Take 2,000 mcg by mouth daily.   doxycycline (ADOXA) 100 MG tablet Take 100 mg by mouth 2 (two) times daily.   feeding supplement (ENSURE ENLIVE / ENSURE PLUS) LIQD Take 237 mLs by mouth 3 (three) times daily between meals.   ferrous sulfate 325 (65 FE) MG EC tablet Take 325 mg by mouth every Monday, Wednesday, and Friday.   finasteride (PROSCAR) 5 MG tablet Take 1 tablet (5 mg total) by mouth daily.   guaiFENesin-dextromethorphan (ROBITUSSIN DM) 100-10 MG/5ML syrup Take 10 mLs by mouth every 4 (four) hours as needed for cough.   ipratropium-albuterol (DUONEB) 0.5-2.5 (3) MG/3ML SOLN Take 3 mLs by nebulization every 6 (six) hours as needed (wheezing, shortness of breath).   losartan (COZAAR) 25 MG tablet Take 12.5 mg by mouth daily.   Multiple Vitamin (MULTIVITAMIN) tablet Take 1 tablet by mouth daily. Men's health   Nebulizer MISC 1 each by Does not apply route daily as needed. PLEASE PROVIDE NEBULIZER KIT TO INCLUDE TUBING/MASK/MOUTHPIECE   Omega-3 Fatty Acids (FISH OIL CONCENTRATE PO) Take 1,000 mg by mouth daily.   OXYGEN Inhale 2 L into the lungs as directed. Every Shift; Day, Evening, Night   pantoprazole (PROTONIX) 40 MG tablet Take 1 tablet (40 mg total) by mouth daily.   pyridoxine (B-6) 100 MG tablet Take 100 mg by mouth  daily.   tamsulosin (FLOMAX) 0.4 MG CAPS capsule Take 2 capsules (0.8 mg total) by mouth daily.   traMADol (ULTRAM) 50 MG tablet Take 0.5 tablets (25 mg total) by mouth every 6 (six) hours for 12 days.   UNABLE TO FIND Diet: Dysphagia 3/Thin Liquids, Consistent carbohydrate   zinc sulfate 220 (50 Zn) MG capsule Take 1 capsule (220 mg total) by mouth daily.   [DISCONTINUED] polyethylene glycol (MIRALAX / GLYCOLAX) 17 g packet Take 17 g by mouth daily.   No facility-administered encounter medications on file as of 02/04/2023.     SIGNIFICANT DIAGNOSTIC EXAMS  LABS REVIEWED; PREVIOUS    01-20-23; wbc 7.9 hgb 15.0; hct 45.6; mcv 89.2 plt 246; glucose 118 bun 31; creat 0.96; k+ 4.2; na++ 140; ca 10.4; gfr >60; protein 7.0; albumin 3.9 01-23-23; wbc 13.0; hgb 11.3; hct 34.0; mcv 90.7 plt 166;glucose 149; bun 17; creat 0.84; k+ 3.9; na++ 134; ca 8.6; gfr >60; phos 2.4 albumin 2.7; mag 1.9; vitamin B12: 837; folate 15.5; iron 19; tibc 320; ferritin 72.  01-25-23: hgb A1c 6.8 01-27-23: wbc 11.0; hgb 11.2; hct 34.4; mcv 88.9 plt 215; glucose 169; bun 24; creat 0.72; k+ 3.4; na++ 138; ca 8.9; gfr >60; protein 5.6; albumin 2.5; mag 1.9  NO NEW LABS.   Review of Systems  Constitutional:  Negative for malaise/fatigue.  Respiratory:  Negative for cough and shortness of breath.   Cardiovascular:  Negative for chest pain, palpitations and leg swelling.  Gastrointestinal:  Negative for abdominal pain, constipation and heartburn.  Musculoskeletal:  Positive for joint pain. Negative for back pain and myalgias.  Skin: Negative.   Neurological:  Negative for dizziness.  Psychiatric/Behavioral:  The patient is not nervous/anxious.    Physical Exam Constitutional:      General: He is not in acute distress.    Appearance: He is well-developed. He is not diaphoretic.  Neck:  Thyroid: No thyromegaly.  Cardiovascular:     Rate and Rhythm: Normal rate and regular rhythm.     Pulses: Normal pulses.     Heart  sounds: Normal heart sounds.  Pulmonary:     Effort: Pulmonary effort is normal. No respiratory distress.     Breath sounds: Wheezing present.  Abdominal:     General: Bowel sounds are normal. There is no distension.     Palpations: Abdomen is soft.     Tenderness: There is no abdominal tenderness.  Musculoskeletal:     Cervical back: Neck supple.     Right lower leg: No edema.     Left lower leg: No edema.     Comments: Left knee in splint   Lymphadenopathy:     Cervical: No cervical adenopathy.  Skin:    General: Skin is warm and dry.  Neurological:     Mental Status: He is alert. Mental status is at baseline.  Psychiatric:        Mood and Affect: Mood normal.        ASSESSMENT/ PLAN:  TODAY  Closed displaced comminuted fracture left patella sequela:  will stop oxycodone; will begin tramadol 25 mg every 6 hours routinely.    Ok Edwards NP [facility]   call [phone]

## 2023-02-04 NOTE — Patient Instructions (Addendum)
Non weightbearing  No range of motion  Medications as needed  Follow up in 2 weeks

## 2023-02-04 NOTE — Progress Notes (Signed)
Orthopaedic Postop Note  Assessment: Michael Evans is a 82 y.o. male s/p operative fixation of left patella fracture  DOS: 01/22/2023  Plan: Jodell Cipro were removed.  Steri-Strips placed.  Radiographs are stable.  He is to continue nonweightbearing.  No range of motion.  Knee immobilizer at all times.  Medications as needed.  Follow-up in 2 weeks.   Follow-up: Return in about 2 weeks (around 02/18/2023). XR at next visit: Left knee  Subjective:  Chief Complaint  Patient presents with   Post-op Follow-up    Left knee     History of Present Illness: Michael Evans is a 82 y.o. male who presents following the above stated procedure.  Surgery was approximately 2 weeks ago.  He has been discharged to a nursing facility.  He was diagnosed with COVID during his hospital stay.  He is stable otherwise.  Pain is controlled.  No numbness or tingling distally.  Review of Systems: No fevers or chills No numbness or tingling No Chest Pain + shortness of breath   Objective: There were no vitals taken for this visit.  Physical Exam:  Elderly male.  Seated in wheelchair.  Alert and oriented.  Left knee with anterior based surgical incision which is healing well.  No surrounding erythema or drainage.  Swelling is improved.  He is able to achieve full extension.  Tolerates approximately 30 degrees of flexion.  Toes are warm and well-perfused.  IMAGING: I personally ordered and reviewed the following images:   X-rays of the left knee were obtained in clinic today.  Comminuted fracture of the patella is visible.  Plate and screws remain in good position.  No interval displacement.  K wires have also remained in a stable position.  No hardware failure.  No backing out.  No bony lesions.  Impression: Stable left patella fracture following operative fixation.  Mordecai Rasmussen, MD 02/04/2023 2:16 PM

## 2023-02-05 ENCOUNTER — Encounter: Payer: Self-pay | Admitting: Adult Health

## 2023-02-05 DIAGNOSIS — F0150 Vascular dementia without behavioral disturbance: Secondary | ICD-10-CM | POA: Insufficient documentation

## 2023-02-13 ENCOUNTER — Encounter: Payer: Self-pay | Admitting: Adult Health

## 2023-02-13 ENCOUNTER — Non-Acute Institutional Stay (SKILLED_NURSING_FACILITY): Payer: 59 | Admitting: Adult Health

## 2023-02-13 DIAGNOSIS — J439 Emphysema, unspecified: Secondary | ICD-10-CM

## 2023-02-13 DIAGNOSIS — S82042S Displaced comminuted fracture of left patella, sequela: Secondary | ICD-10-CM | POA: Diagnosis not present

## 2023-02-13 DIAGNOSIS — J9611 Chronic respiratory failure with hypoxia: Secondary | ICD-10-CM | POA: Diagnosis not present

## 2023-02-13 DIAGNOSIS — I700 Atherosclerosis of aorta: Secondary | ICD-10-CM | POA: Diagnosis not present

## 2023-02-13 NOTE — Progress Notes (Signed)
Location:  [facility] Number: [id number] Place of Service:  SNF (31)   CODE STATUS: full   Allergies  Allergen Reactions   Benzyl Alcohol Itching   Amlodipine Swelling    Swelling in feet   Aspirin Other (See Comments)    Upset stomach.   Other Hypertension    Hazelnuts   Penicillins Hives    Has patient had a PCN reaction causing immediate rash, facial/tongue/throat swelling, SOB or lightheadedness with hypotension: Yes Has patient had a PCN reaction causing severe rash involving mucus membranes or skin necrosis: No Has patient had a PCN reaction that required hospitalization No Has patient had a PCN reaction occurring within the last 10 years: No If all of the above answers are "NO", then may proceed with Cephalosporin use.    Sulfa Antibiotics Hives   Tylenol [Acetaminophen] Other (See Comments)    Pt reports he has had liver issues and was told not to take tylenol     Chief Complaint  Patient presents with   Acute Visit    Care plan meeting     HPI:  We have come together for his care plan meeting. Family present.  BIMS 11/15 mood 6/30: not sleeping well; nervous at times. He is nonambulatory with no falls since admission. He requires supervision to max assist with his adl care. He is occasionally incontinent of bladder and frequently incontinent of bowel. Dietary: D3; diet appetite poor at 1-25% weight is 126.6 pounds. On ensure and protein supplements three times daily. Therapy: is non-weight bearing complications with house; bed mobility independent; stand pivot contact guard; hop 55 feet with a walker with contact guard; no stairs; upper body contact guard; lower body and brp contact guard. ST: cognition; on soft diet. Activities: does not participate. He will continue to be followed for his chronic illnesses including:  Chronic respiratory failure with hypoxia  Closed displaced comminuted fracture of  left patella sequela  Aortic atherosclerosis   Pulmonary emphysema unspecified emphysema type  Past Medical History:  Diagnosis Date   Arthritis    Chronic rhinitis    Complication of anesthesia    patient usually hs to be intubated with anesthesia   COPD (chronic obstructive pulmonary disease) ([place])    PFT 06-26-09 FEV1  1.75( 71%), FVC 3.65( 101%), FEV1% 48, TLC 5.53(104%), DLCO 55%, no BD   Dyspnea    Hypertension    Nasal septal deviation    Nocturia    On home O2    2L N/C   OSA (obstructive sleep apnea) 04/21/2011   Partial small bowel obstruction ([place]) 10/18/2021   Skin cancer     Past Surgical History:  Procedure Laterality Date   APPENDECTOMY     BACK SURGERY     CATARACT EXTRACTION W/PHACO  01/06/2013   Procedure: CATARACT EXTRACTION PHACO AND INTRAOCULAR LENS PLACEMENT ([place]);  Surgeon: Tonny Branch, MD;  Location: [facility];  Service: Ophthalmology;  Laterality: Right;  CDE: 22.17   CATARACT EXTRACTION W/PHACO Left 06/20/2022   Procedure: CATARACT EXTRACTION PHACO AND INTRAOCULAR LENS PLACEMENT ([facility]);  Surgeon: Baruch Goldmann, MD;  Location: [facility];  Service: Ophthalmology;  Laterality: Left;  CDE 20.85   FEMORAL HERNIA REPAIR Right 11/19/2022   Procedure: HERNIA REPAIR FEMORAL WITH MESH;  Surgeon: [name], MD;  Location: [facility];  Service: General;  Laterality: Right;   INGUINAL HERNIA REPAIR Left 12/09/2021   Procedure: HERNIA REPAIR INGUINAL ADULT WITH MESH;  Surgeon: [name], MD;  Location: [facility];  Service: General;  Laterality: Left;   INGUINAL HERNIA REPAIR Right 03/19/2022   Procedure: HERNIA REPAIR INGUINAL ADULT WITH MESH;  Surgeon: [name], MD;  Location: [facility];  Service: General;  Laterality: Right;   ORIF PATELLA Left 01/22/2023   Procedure: OPEN REDUCTION INTERNAL FIXATION (ORIF) PATELLA;  Surgeon: Mordecai Rasmussen, MD;  Location: [facility];  Service: Orthopedics;  Laterality: Left;    Social History   Socioeconomic History   Marital status: Divorced    Spouse name: Not on file    Number of children: 3   Years of education: Not on file   Highest education level: Not on file  Occupational History   Occupation: Neurosurgeon: RETIRED  Tobacco Use   Smoking status: Former    Packs/day: 1.50    Years: 54.00    Additional pack years: 0.00    Total pack years: 81.00    Types: Cigarettes    Quit date: 12/01/2001    Years since quitting: 21.2   Smokeless tobacco: Never  Vaping Use   Vaping Use: Never used  Substance and Sexual Activity   Alcohol use: No   Drug use: No   Sexual activity: Not on file  Other Topics Concern   Not on file  Social History Narrative   Lives with sister.    Social Determinants of Health   Financial Resource Strain: Low Risk  (02/07/2022)   Overall Financial Resource Strain (CARDIA)    Difficulty of Paying Living Expenses: Not hard at all  Food Insecurity: No Food Insecurity (01/20/2023)   Hunger Vital Sign    Worried About Running Out of Food in the Last Year: Never true    Ran Out of Food in the Last Year: Never true  Transportation Needs: No Transportation Needs (01/20/2023)   PRAPARE - Hydrologist (Medical): No    Lack of Transportation (Non-Medical): No  Physical Activity: Insufficiently Active (02/07/2022)   Exercise Vital Sign    Days of Exercise per Week: 3 days    Minutes of Exercise per Session: 20 min  Stress: No Stress Concern Present (02/07/2022)   [place]    Feeling of Stress : Not at all  Social Connections: Socially Isolated (02/07/2022)   Social Connection and Isolation Panel [NHANES]    Frequency of Communication with Friends and Family: More than three times a week    Frequency of Social Gatherings with Friends and Family: More than three times a week    Attends Religious Services: Never    Marine scientist or Organizations: No    Attends Archivist Meetings: Never    Marital Status:  Divorced  Human resources officer Violence: Not At Risk (01/20/2023)   Humiliation, Afraid, Rape, and Kick questionnaire    Fear of Current or Ex-Partner: No    Emotionally Abused: No    Physically Abused: No    Sexually Abused: No   Family History  Problem Relation Age of Onset   Lung cancer Mother       VITAL SIGNS BP 118/64   Pulse 68   Temp 98.1 F (36.7 C)   Resp 20   Ht 5\' 5"  (1.651 m)   Wt 126 lb 9.6 oz (57.4 kg)   SpO2 92%   BMI 21.07 kg/m   Outpatient Encounter Medications as of 02/13/2023  Medication Sig   albuterol (VENTOLIN HFA)  108 (90 Base) MCG/ACT inhaler Inhale 2 puffs into the lungs every 6 (six) hours as needed for wheezing or shortness of breath.   Albuterol Sulfate 2.5 MG/0.5ML NEBU Inhale 1 each into the lungs 3 (three) times daily.   Amino Acids-Protein Hydrolys (FEEDING SUPPLEMENT, PRO-STAT SUGAR FREE 64,) LIQD Take 30 mLs by mouth 3 (three) times daily with meals.   ascorbic acid (VITAMIN C) 500 MG tablet Take 1,000 mg by mouth daily.   Budeson-Glycopyrrol-Formoterol (BREZTRI AEROSPHERE) 160-9-4.8 MCG/ACT AERO Inhale 2 puffs into the lungs in the morning and at bedtime.   cholecalciferol (VITAMIN D) 25 MCG (1000 UNIT) tablet Take 3,000 Units by mouth daily.   cyanocobalamin 1000 MCG tablet Take 2,000 mcg by mouth daily.   doxycycline (ADOXA) 100 MG tablet Take 100 mg by mouth 2 (two) times daily.   feeding supplement (ENSURE ENLIVE / ENSURE PLUS) LIQD Take 237 mLs by mouth 3 (three) times daily between meals.   ferrous sulfate 325 (65 FE) MG EC tablet Take 325 mg by mouth every Monday, Wednesday, and Friday.   finasteride (PROSCAR) 5 MG tablet Take 1 tablet (5 mg total) by mouth daily.   guaiFENesin-dextromethorphan (ROBITUSSIN DM) 100-10 MG/5ML syrup Take 10 mLs by mouth every 4 (four) hours as needed for cough.   ipratropium-albuterol (DUONEB) 0.5-2.5 (3) MG/3ML SOLN Take 3 mLs by nebulization every 6 (six) hours as needed (wheezing, shortness of breath).    losartan (COZAAR) 25 MG tablet Take 12.5 mg by mouth daily.   Multiple Vitamin (MULTIVITAMIN) tablet Take 1 tablet by mouth daily. Men's health   Nebulizer MISC 1 each by Does not apply route daily as needed. PLEASE PROVIDE NEBULIZER KIT TO INCLUDE TUBING/MASK/MOUTHPIECE   Omega-3 Fatty Acids (FISH OIL CONCENTRATE PO) Take 1,000 mg by mouth daily.   OXYGEN Inhale 2 L into the lungs as directed. Every Shift; Day, Evening, Night   pantoprazole (PROTONIX) 40 MG tablet Take 1 tablet (40 mg total) by mouth daily.   pyridoxine (B-6) 100 MG tablet Take 100 mg by mouth daily.   tamsulosin (FLOMAX) 0.4 MG CAPS capsule Take 2 capsules (0.8 mg total) by mouth daily.   traMADol (ULTRAM) 50 MG tablet Take 0.5 tablets (25 mg total) by mouth every 6 (six) hours for 12 days.   UNABLE TO FIND Diet: Dysphagia 3/Thin Liquids, Consistent carbohydrate   zinc sulfate 220 (50 Zn) MG capsule Take 1 capsule (220 mg total) by mouth daily.   No facility-administered encounter medications on file as of 02/13/2023.     SIGNIFICANT DIAGNOSTIC EXAMS  LABS REVIEWED; PREVIOUS    01-20-23; wbc 7.9 hgb 15.0; hct 45.6; mcv 89.2 plt 246; glucose 118 bun 31; creat 0.96; k+ 4.2; na++ 140; ca 10.4; gfr >60; protein 7.0; albumin 3.9 01-23-23; wbc 13.0; hgb 11.3; hct 34.0; mcv 90.7 plt 166;glucose 149; bun 17; creat 0.84; k+ 3.9; na++ 134; ca 8.6; gfr >60; phos 2.4 albumin 2.7; mag 1.9; vitamin B12: 837; folate 15.5; iron 19; tibc 320; ferritin 72.  01-25-23: hgb A1c 6.8 01-27-23: wbc 11.0; hgb 11.2; hct 34.4; mcv 88.9 plt 215; glucose 169; bun 24; creat 0.72; k+ 3.4; na++ 138; ca 8.9; gfr >60; protein 5.6; albumin 2.5; mag 1.9  NO NEW LABS.   Review of Systems  Constitutional:  Negative for malaise/fatigue.  Respiratory:  Negative for cough and shortness of breath.   Cardiovascular:  Negative for chest pain, palpitations and leg swelling.  Gastrointestinal:  Negative for abdominal pain, constipation and heartburn.  Musculoskeletal:  Negative for back pain, joint pain and myalgias.  Skin: Negative.   Neurological:  Negative for dizziness.  Psychiatric/Behavioral:  The patient is not nervous/anxious.     Physical Exam Constitutional:      General: He is not in acute distress.    Appearance: He is well-developed. He is not diaphoretic.  Neck:     Thyroid: No thyromegaly.  Cardiovascular:     Rate and Rhythm: Normal rate and regular rhythm.     Pulses: Normal pulses.     Heart sounds: Normal heart sounds.  Pulmonary:     Effort: Pulmonary effort is normal. No respiratory distress.     Breath sounds: Normal breath sounds.  Abdominal:     General: Bowel sounds are normal. There is no distension.     Palpations: Abdomen is soft.     Tenderness: There is no abdominal tenderness.  Musculoskeletal:     Cervical back: Neck supple.     Right lower leg: No edema.     Left lower leg: No edema.     Comments: Left knee in splint   Lymphadenopathy:     Cervical: No cervical adenopathy.  Skin:    General: Skin is warm and dry.  Neurological:     Mental Status: He is alert. Mental status is at baseline.  Psychiatric:        Mood and Affect: Mood normal.       ASSESSMENT/ PLAN:  TODAY  Chronic respiratory failure with hypoxia Closed displaced comminuted fracture of  left patella sequela Aortic atherosclerosis Pulmonary emphysema unspecified emphysema type  Will continue therapy as directed Will continue to monitor his status  Will change his ultram to 25 mg every 6 hours as needed The goal of care at this time is long term care either in SNF or assisted living  Time spent with patient: 40 minutes: medications; therapy; goals of care    Ok Edwards NP [facility]   call [phone]

## 2023-02-17 ENCOUNTER — Telehealth: Payer: Self-pay | Admitting: Orthopedic Surgery

## 2023-02-17 ENCOUNTER — Ambulatory Visit (INDEPENDENT_AMBULATORY_CARE_PROVIDER_SITE_OTHER): Payer: 59 | Admitting: Orthopedic Surgery

## 2023-02-17 ENCOUNTER — Other Ambulatory Visit (INDEPENDENT_AMBULATORY_CARE_PROVIDER_SITE_OTHER): Payer: 59

## 2023-02-17 ENCOUNTER — Telehealth: Payer: Self-pay | Admitting: Pharmacist

## 2023-02-17 DIAGNOSIS — S82042D Displaced comminuted fracture of left patella, subsequent encounter for closed fracture with routine healing: Secondary | ICD-10-CM

## 2023-02-17 NOTE — Progress Notes (Signed)
Care Management & Coordination Services Pharmacy Team  Reason for Encounter: General adherence update   Contacted patient for general health update and medication adherence call.  Spoke with family on 02/17/2023    I briefly spoke with patients sister. She states the patient fell recently while on a ladder trying to fix a bird feeder. The patient fell and broke his knee cap. He had surgery for repairs. He is temporarily in a nursing home while healing. She states he is still using Beztri inhaler twice daily She is unsure if the patient is having any shortness of breath or increased sputum. She will give [place] a call with any questions or concerns.  Chart Updates:  Recent office visits:  None since last PharmD call  Recent consult visits:  02/04/2023 OV (Orthopedics) Mordecai Rasmussen, MD; no medication changes indicated.  Hospital visits:  01/20/2023 ED to Hospital Admission for closed displaced comminuted fracture of left patella Surgery OPEN REDUCTION INTERNAL FIXATION PATELLA  Medications: Outpatient Encounter Medications as of 02/17/2023  Medication Sig   albuterol (VENTOLIN HFA) 108 (90 Base) MCG/ACT inhaler Inhale 2 puffs into the lungs every 6 (six) hours as needed for wheezing or shortness of breath.   Albuterol Sulfate 2.5 MG/0.5ML NEBU Inhale 1 each into the lungs 3 (three) times daily.   Amino Acids-Protein Hydrolys (FEEDING SUPPLEMENT, PRO-STAT SUGAR FREE 64,) LIQD Take 30 mLs by mouth 3 (three) times daily with meals.   ascorbic acid (VITAMIN C) 500 MG tablet Take 1,000 mg by mouth daily.   Budeson-Glycopyrrol-Formoterol (BREZTRI AEROSPHERE) 160-9-4.8 MCG/ACT AERO Inhale 2 puffs into the lungs in the morning and at bedtime.   cholecalciferol (VITAMIN D) 25 MCG (1000 UNIT) tablet Take 3,000 Units by mouth daily.   cyanocobalamin 1000 MCG tablet Take 2,000 mcg by mouth daily.   doxycycline (ADOXA) 100 MG tablet Take 100 mg by mouth 2 (two) times daily.   feeding supplement (ENSURE  ENLIVE / ENSURE PLUS) LIQD Take 237 mLs by mouth 3 (three) times daily between meals.   ferrous sulfate 325 (65 FE) MG EC tablet Take 325 mg by mouth every Monday, Wednesday, and Friday.   finasteride (PROSCAR) 5 MG tablet Take 1 tablet (5 mg total) by mouth daily.   guaiFENesin-dextromethorphan (ROBITUSSIN DM) 100-10 MG/5ML syrup Take 10 mLs by mouth every 4 (four) hours as needed for cough.   ipratropium-albuterol (DUONEB) 0.5-2.5 (3) MG/3ML SOLN Take 3 mLs by nebulization every 6 (six) hours as needed (wheezing, shortness of breath).   losartan (COZAAR) 25 MG tablet Take 12.5 mg by mouth daily.   Multiple Vitamin (MULTIVITAMIN) tablet Take 1 tablet by mouth daily. Men's health   Nebulizer MISC 1 each by Does not apply route daily as needed. PLEASE PROVIDE NEBULIZER KIT TO INCLUDE TUBING/MASK/MOUTHPIECE   Omega-3 Fatty Acids (FISH OIL CONCENTRATE PO) Take 1,000 mg by mouth daily.   OXYGEN Inhale 2 L into the lungs as directed. Every Shift; Day, Evening, Night   pantoprazole (PROTONIX) 40 MG tablet Take 1 tablet (40 mg total) by mouth daily.   pyridoxine (B-6) 100 MG tablet Take 100 mg by mouth daily.   tamsulosin (FLOMAX) 0.4 MG CAPS capsule Take 2 capsules (0.8 mg total) by mouth daily.   UNABLE TO FIND Diet: Dysphagia 3/Thin Liquids, Consistent carbohydrate   zinc sulfate 220 (50 Zn) MG capsule Take 1 capsule (220 mg total) by mouth daily.   No facility-administered encounter medications on file as of 02/17/2023.    Recent vitals BP Readings from  Last 3 Encounters:  02/13/23 118/64  02/04/23 114/68  02/02/23 (!) 156/72   Pulse Readings from Last 3 Encounters:  02/13/23 68  02/02/23 62  01/30/23 82   Wt Readings from Last 3 Encounters:  02/13/23 126 lb 9.6 oz (57.4 kg)  02/04/23 128 lb 6.4 oz (58.2 kg)  02/02/23 128 lb 6.4 oz (58.2 kg)   BMI Readings from Last 3 Encounters:  02/13/23 21.07 kg/m  02/04/23 21.37 kg/m  02/02/23 21.37 kg/m    Recent lab results     Component Value Date/Time   NA 138 01/29/2023 0354   K 3.5 01/29/2023 0354   CL 100 01/29/2023 0354   CO2 31 01/29/2023 0354   GLUCOSE 117 (H) 01/29/2023 0354   BUN 32 (H) 01/29/2023 0354   CREATININE 0.88 01/29/2023 0354   CREATININE 1.02 10/04/2021 1016   CALCIUM 8.6 (L) 01/29/2023 0354    Lab Results  Component Value Date   CREATININE 0.88 01/29/2023   EGFR 73 10/04/2021   GFRNONAA >60 01/29/2023   GFRAA 95 11/11/2018   Lab Results  Component Value Date/Time   HGBA1C 6.8 (H) 01/25/2023 07:09 AM   HGBA1C 6.5 (H) 05/23/2022 07:03 PM    No results found for: "CHOL", "HDL", "LDLCALC", "LDLDIRECT", "TRIG", "CHOLHDL"  Care Gaps: Annual wellness visit in last year? No  Star Rating Drugs:  Losartan - no fill date   Future Appointments  Date Time Provider [place]  02/17/2023  2:45 PM Mordecai Rasmussen, MD OCR-OCR None  05/14/2023  2:30 PM Edythe Clarity, [place] None   April D Calhoun, [place] Pharmacist Assistant [phone]

## 2023-02-17 NOTE — Telephone Encounter (Signed)
Dr. Amedeo Kinsman patient - Asa Lente [phone] at [facility] called to speak with Leta. Patient was seen today.

## 2023-02-17 NOTE — Patient Instructions (Signed)
Nonweightbearing right leg  Okay to remove the brace for gentle range of motion of the right knee.  Follow-up in 2 weeks

## 2023-02-18 ENCOUNTER — Encounter: Payer: 59 | Admitting: Orthopedic Surgery

## 2023-02-18 ENCOUNTER — Encounter: Payer: Self-pay | Admitting: Orthopedic Surgery

## 2023-02-18 NOTE — Progress Notes (Signed)
Orthopaedic Postop Note  Assessment: Michael Evans is a 82 y.o. male s/p operative fixation of left patella fracture  DOS: 01/22/2023  Plan: Surgical incision is healing appropriately.  Radiographs are stable.  Minimal displacement of the comminuted patella fracture.  On physical exam, the hardware is prominent.  He tolerates passive full extension, as well as flexion to approximately 45 degrees.  Continue nonweightbearing.  Continue use of the knee immobilizer.  I will see him back in 2 weeks with repeat x-rays.   Follow-up: Return in about 2 weeks (around 03/03/2023). XR at next visit: Left knee  Subjective:  Chief Complaint  Patient presents with   Routine Post Op    L patella DOS: 01/22/2023    History of Present Illness: Michael Evans is a 82 y.o. male who presents following the above stated procedure.  Surgery was approximately 4 weeks ago.  He remains in a nursing facility.  He has tolerated the brace.  He is not putting weight on his left lower extremity.  No issues with his incisions.  He denies fevers or chills.   Review of Systems: No fevers or chills No numbness or tingling No Chest Pain + shortness of breath   Objective: There were no vitals taken for this visit.  Physical Exam:  Elderly male.  Seated in wheelchair.  Alert and oriented.  Left knee surgical incision is healing well.  No surrounding erythema or drainage.  Minimal swelling about the knee.  Some of the hardware is palpable on exam.  Able to passively get to full extension.  He tolerates flexion to approximately 45 degrees.  IMAGING: I personally ordered and reviewed the following images:   AP and lateral x-rays of the left knee were obtained in clinic today.  These are compared to prior x-rays.  There has been no interval displacement of the comminuted patella fracture.  Hardware remains intact.  No evidence of screws backing out.  K wires are intact.  No bony lesions.  Impression: Healing left  comminuted patella fracture in stable alignment.  Michael Rasmussen, MD 02/18/2023 4:04 PM

## 2023-02-19 NOTE — Telephone Encounter (Signed)
Attempted to call, no answer.

## 2023-02-25 NOTE — Telephone Encounter (Signed)
No answer, closing call at this time.

## 2023-03-02 DIAGNOSIS — Z4789 Encounter for other orthopedic aftercare: Secondary | ICD-10-CM | POA: Diagnosis not present

## 2023-03-02 DIAGNOSIS — Z8616 Personal history of COVID-19: Secondary | ICD-10-CM | POA: Diagnosis not present

## 2023-03-02 DIAGNOSIS — J441 Chronic obstructive pulmonary disease with (acute) exacerbation: Secondary | ICD-10-CM | POA: Diagnosis not present

## 2023-03-02 DIAGNOSIS — Z9981 Dependence on supplemental oxygen: Secondary | ICD-10-CM | POA: Diagnosis not present

## 2023-03-02 DIAGNOSIS — K219 Gastro-esophageal reflux disease without esophagitis: Secondary | ICD-10-CM | POA: Diagnosis not present

## 2023-03-02 DIAGNOSIS — F0150 Vascular dementia without behavioral disturbance: Secondary | ICD-10-CM | POA: Diagnosis not present

## 2023-03-02 DIAGNOSIS — D509 Iron deficiency anemia, unspecified: Secondary | ICD-10-CM | POA: Diagnosis not present

## 2023-03-02 DIAGNOSIS — S82042D Displaced comminuted fracture of left patella, subsequent encounter for closed fracture with routine healing: Secondary | ICD-10-CM | POA: Diagnosis not present

## 2023-03-02 DIAGNOSIS — Z9181 History of falling: Secondary | ICD-10-CM | POA: Diagnosis not present

## 2023-03-02 DIAGNOSIS — J9611 Chronic respiratory failure with hypoxia: Secondary | ICD-10-CM | POA: Diagnosis not present

## 2023-03-02 DIAGNOSIS — R262 Difficulty in walking, not elsewhere classified: Secondary | ICD-10-CM | POA: Diagnosis not present

## 2023-03-02 DIAGNOSIS — R488 Other symbolic dysfunctions: Secondary | ICD-10-CM | POA: Diagnosis not present

## 2023-03-02 DIAGNOSIS — R2681 Unsteadiness on feet: Secondary | ICD-10-CM | POA: Diagnosis not present

## 2023-03-02 DIAGNOSIS — R1312 Dysphagia, oropharyngeal phase: Secondary | ICD-10-CM | POA: Diagnosis not present

## 2023-03-02 DIAGNOSIS — R627 Adult failure to thrive: Secondary | ICD-10-CM | POA: Diagnosis not present

## 2023-03-02 DIAGNOSIS — R498 Other voice and resonance disorders: Secondary | ICD-10-CM | POA: Diagnosis not present

## 2023-03-02 DIAGNOSIS — I10 Essential (primary) hypertension: Secondary | ICD-10-CM | POA: Diagnosis not present

## 2023-03-02 DIAGNOSIS — J449 Chronic obstructive pulmonary disease, unspecified: Secondary | ICD-10-CM | POA: Diagnosis not present

## 2023-03-02 DIAGNOSIS — M6281 Muscle weakness (generalized): Secondary | ICD-10-CM | POA: Diagnosis not present

## 2023-03-03 ENCOUNTER — Encounter: Payer: Self-pay | Admitting: Orthopedic Surgery

## 2023-03-03 ENCOUNTER — Other Ambulatory Visit (INDEPENDENT_AMBULATORY_CARE_PROVIDER_SITE_OTHER): Payer: 59

## 2023-03-03 ENCOUNTER — Ambulatory Visit (INDEPENDENT_AMBULATORY_CARE_PROVIDER_SITE_OTHER): Payer: 59 | Admitting: Orthopedic Surgery

## 2023-03-03 DIAGNOSIS — S82042D Displaced comminuted fracture of left patella, subsequent encounter for closed fracture with routine healing: Secondary | ICD-10-CM

## 2023-03-03 NOTE — Patient Instructions (Signed)
Ok to initiate ROM   WB is ok, recommend using a walker

## 2023-03-04 NOTE — Progress Notes (Signed)
Orthopaedic Postop Note  Assessment: Michael Evans is a 82 y.o. male s/p operative fixation of left patella fracture  DOS: 01/22/2023  Plan: Mr. Wiederholt is doing well.  He tolerates flexion after removal of the brace.  He easily gets to 80 degrees.  Surgical incision is healed.  Hardware remains intact.  Radiographs are stable.  Some of the hardware is already prominent.  His pain is controlled.  Will allow him to start working on range of motion, with gradual increase in his weightbearing.  He states understanding.  I will see him in about 6 weeks.   Follow-up: Return in about 6 weeks (around 04/14/2023). XR at next visit: Left knee  Subjective:  Chief Complaint  Patient presents with   Routine Post Op    L knee DOS 01/22/23    History of Present Illness: Michael Evans is a 82 y.o. male who presents following the above stated procedure.  Surgery was approximately 6 weeks ago.  He is still in the nursing facility.  He is working with physical therapy.  He states his knee feels better.  He has no pain.  No issues with his incision.  He continues to use the brace.  He has not been bearing weight.  Review of Systems: No fevers or chills No numbness or tingling No Chest Pain + shortness of breath   Objective: There were no vitals taken for this visit.  Physical Exam:  Elderly male.  Seated in wheelchair.  Mask and supplemental oxygen in place.  Alert and oriented.  Left knee surgical incision is healing well.  No surrounding erythema or drainage.  Minimal swelling about the knee.  Hardware is palpable.  Able to passively get to full extension.  He tolerates flexion to approximately 80-85 degrees.  IMAGING: I personally ordered and reviewed the following images:   AP and lateral x-rays left knee were obtained in clinic today.  These have been compared to prior x-rays.  No acute injuries are noted.  Well-positioned hardware.  K wires are not backing out.  Screws are remain in good  position.  Fracture remains appropriately aligned.  Mild step-off appreciated within the patella.  There has been interval consolidation.  No bony lesions.  Impression: Left comminuted patella fracture following operative fixation, without hardware failure  Mordecai Rasmussen, MD 03/04/2023 1:52 PM

## 2023-03-11 ENCOUNTER — Encounter: Payer: Self-pay | Admitting: Internal Medicine

## 2023-03-11 ENCOUNTER — Non-Acute Institutional Stay (SKILLED_NURSING_FACILITY): Payer: 59 | Admitting: Internal Medicine

## 2023-03-11 DIAGNOSIS — F0150 Vascular dementia without behavioral disturbance: Secondary | ICD-10-CM | POA: Diagnosis not present

## 2023-03-11 DIAGNOSIS — J9611 Chronic respiratory failure with hypoxia: Secondary | ICD-10-CM

## 2023-03-11 DIAGNOSIS — R627 Adult failure to thrive: Secondary | ICD-10-CM | POA: Diagnosis not present

## 2023-03-11 NOTE — Assessment & Plan Note (Signed)
O2 dependent  He states that his breathing is "pretty good"" but he exhibits silent lungs except for expiratory rhonchi @ bases. Clinically he is poorly compensated with high risk of exacerbation of COPD.  Should that occur;pulmonary toilet will be intensified and steroids prescribed.

## 2023-03-11 NOTE — Assessment & Plan Note (Addendum)
TSH, VDRL, and B12 level will also be performed to rule out other comorbidities beyond vascular dementia.

## 2023-03-11 NOTE — Patient Instructions (Signed)
See assessment and plan under each diagnosis in the problem list and acutely for this visit 

## 2023-03-11 NOTE — Assessment & Plan Note (Signed)
His sister apparently is unable to care for him unless he is independent.  Labs will be updated to assess status of comorbidities.

## 2023-03-11 NOTE — Progress Notes (Unsigned)
NURSING HOME LOCATION:  [facility] ROOM NUMBER: [id number] P  CODE STATUS:  Full Code  PCP: Burnell Blanks, MD  This is a nursing facility follow up visit for specific acute issue of to document compliance with Regulation 483.30 (c) in The Long Term Care Survey Manual Phase 2 which mandates caregiver visit ( visits can alternate among physician, PA or NP as per statutes) within 10 days of 30 days / 60 days/ 90 days post admission to SNF date   Adult failure to thrive with delayed response to PT/OT. Interim medical record and care since last SNF visit was updated with review of diagnostic studies and change in clinical status since last visit were documented.  HPI: Progression with PT/OT rehab has been slow. There is decreased range of motion of the knee.  Dr. Charlesetta Ivory had authorized removal of the brace with gradual WBAT and range of motion as tolerated as of 4/2.  PT/OT states that there is moderate independence with transfers with contact assistance.  They are working with him on navigation of stairs with bilateral rails and walking with a rolling walker.  They feel that he needs additional therapy to address ambulation on uneven terrain, community distance ambulation, and automotive transfer. His sister apparently is homebound and is unable to take care of him unless he is independent.  He states she is disabled because of multiple health problems including heart issues. The failure to thrive syndrome is in the context of him having COVID as an inpatient. Imaging had revealed increased perihilar and bibasilar markings suggesting early infiltrates superimposed on his oxygen dependent COPD. D-dimer had peaked at 2.12 with a final value of 1.45. Peak CRP was 6.2 with a final value of 1.1 .COVID was treated with Paxlovid and IV Solu-Medrol followed by oral steroid taper.   Labs have not been performed since 2/29.  At that time protein/caloric malnutrition was present with albumin of 2.6  and total protein of 5.7.  Renal function was stage II with a creatinine of 0.88 and GFR greater than 60.  Slight prerenal azotemia was present with a BUN of 32.  At that time he continued to exhibit leukocytosis with a white count of 11,700 left shift.  Normochromic, normocytic anemia was present with H/H of 11.5/35.8. Additionally there is concern for dementia.  SLUMS MMSE was originally done on 01/30/2023.  His score of 5 in the context of an 8th grade education was compatible with severe dementia.  Repeat MMSE on 3/20 revealed a score of 15.  Although this is a significant improvement; this would still be dementia in the context of his eighth grade education.  Review of systems: He continues to complain of pain in the knee which "keeps me awake."  Otherwise he states he is "doing just great."  He states that his breathing is stable and actually "pretty good."  Constitutional: No fever, significant weight change, fatigue  Eyes: No redness, discharge, pain, vision change ENT/mouth: No nasal congestion,  purulent discharge, earache, change in hearing, sore throat  Cardiovascular: No chest pain, palpitations, paroxysmal nocturnal dyspnea, claudication, edema  Respiratory: No cough, sputum production, hemoptysis, DOE, significant snoring, apnea   Gastrointestinal: No heartburn, dysphagia, abdominal pain, nausea /vomiting, rectal bleeding, melena, change in bowels Genitourinary: No dysuria, hematuria, pyuria, incontinence, nocturia Dermatologic: No rash, pruritus, change in appearance of skin Neurologic: No dizziness, headache, syncope, seizures, numbness, tingling Psychiatric: No significant anxiety, depression, insomnia, anorexia Endocrine: No change in hair/skin/nails, excessive thirst, excessive hunger,  excessive urination  Hematologic/lymphatic: No significant bruising, lymphadenopathy, abnormal bleeding Allergy/immunology: No itchy/watery eyes, significant sneezing, urticaria,  angioedema  Physical exam:  Pertinent or positive findings: He appears his age and chronically ill.  Hair is disheveled.  During the interview his focus was on a partially peeled banana on which he was applying peanut butter from an large open jar.  His nasal oxygen was under his chin.  Mild facial erythema was present at the site where the O2 tubing crosses the malar areas. He is edentulous. Breath sounds were markedly distant; he had low-grade expiratory rhonchi.  Slight tachycardia was present.  Pedal pulses are decreased.  He has a tremor of the left upper extremity.  Eschar and ecchymoses are noted over the right shin.  Limbs are atrophic.  General appearance: no acute distress, increased work of breathing is present.   Lymphatic: No lymphadenopathy about the head, neck, axilla. Eyes: No conjunctival inflammation or lid edema is present. There is no scleral icterus. Ears:  External ear exam shows no significant lesions or deformities.   Nose:  External nasal examination shows no deformity or inflammation. Nasal mucosa are pink and moist without lesions, exudates Neck:  No thyromegaly, masses, tenderness noted.    Heart:  No murmur, click, rub .  Lungs: without wheezes,rales, rubs. Abdomen: Bowel sounds are normal. Abdomen is soft and nontender with no organomegaly, hernias, masses. GU: Deferred  Extremities:  No cyanosis, clubbing, edema  Neurologic exam :Balance, Rhomberg, finger to nose testing could not be completed due to clinical state Skin: Warm & dry w/o tenting.  See summary under each active problem in the Problem List with associated updated therapeutic plan

## 2023-03-12 ENCOUNTER — Other Ambulatory Visit (HOSPITAL_COMMUNITY)
Admission: RE | Admit: 2023-03-12 | Discharge: 2023-03-12 | Disposition: A | Discharge status: Home / Self Care | Payer: 59 | Source: Skilled Nursing Facility | Attending: Adult Health | Admitting: Adult Health

## 2023-03-12 DIAGNOSIS — F0150 Vascular dementia without behavioral disturbance: Secondary | ICD-10-CM | POA: Insufficient documentation

## 2023-03-12 LAB — COMPREHENSIVE METABOLIC PANEL
ALT: 27 U/L (ref 0–44)
AST: 22 U/L (ref 15–41)
Albumin: 3.1 g/dL — ABNORMAL LOW (ref 3.5–5.0)
Alkaline Phosphatase: 58 U/L (ref 38–126)
Anion gap: 9 (ref 5–15)
BUN: 24 mg/dL — ABNORMAL HIGH (ref 8–23)
CO2: 27 mmol/L (ref 22–32)
Calcium: 9.1 mg/dL (ref 8.9–10.3)
Chloride: 99 mmol/L (ref 98–111)
Creatinine, Ser: 0.89 mg/dL (ref 0.61–1.24)
GFR, Estimated: >60 mL/min (ref >60)
Glucose, Bld: 114 mg/dL — ABNORMAL HIGH (ref 70–99)
Potassium: 4 mmol/L (ref 3.5–5.1)
Sodium: 135 mmol/L (ref 135–145)
Total Bilirubin: 0.9 mg/dL (ref 0.3–1.2)
Total Protein: 6.6 g/dL (ref 6.5–8.1)

## 2023-03-12 LAB — CBC WITH DIFFERENTIAL/PLATELET
Abs Immature Granulocytes: 0.11 10*3/uL — ABNORMAL HIGH (ref 0.00–0.07)
Basophils Absolute: 0 10*3/uL (ref 0.0–0.1)
Basophils Relative: 1 %
Eosinophils Absolute: 0.1 10*3/uL (ref 0.0–0.5)
Eosinophils Relative: 1 %
HCT: 38.6 % — ABNORMAL LOW (ref 39.0–52.0)
Hemoglobin: 12.3 g/dL — ABNORMAL LOW (ref 13.0–17.0)
Immature Granulocytes: 1 %
Lymphocytes Relative: 32 %
Lymphs Abs: 2.5 10*3/uL (ref 0.7–4.0)
MCH: 29.1 pg (ref 26.0–34.0)
MCHC: 31.9 g/dL (ref 30.0–36.0)
MCV: 91.3 fL (ref 80.0–100.0)
Monocytes Absolute: 1.3 10*3/uL — ABNORMAL HIGH (ref 0.1–1.0)
Monocytes Relative: 17 %
Neutro Abs: 3.8 10*3/uL (ref 1.7–7.7)
Neutrophils Relative %: 48 %
Platelets: 289 10*3/uL (ref 150–400)
RBC: 4.23 MIL/uL (ref 4.22–5.81)
RDW: 14.6 % (ref 11.5–15.5)
WBC: 7.8 10*3/uL (ref 4.0–10.5)
nRBC: 0 % (ref 0.0–0.2)

## 2023-03-12 LAB — TSH: TSH: 3.506 u[IU]/mL (ref 0.350–4.500)

## 2023-03-12 LAB — VITAMIN B12: Vitamin B-12: 1393 pg/mL — ABNORMAL HIGH (ref 180–914)

## 2023-03-13 LAB — RPR: RPR Ser Ql: NONREACTIVE

## 2023-03-14 DIAGNOSIS — S82042D Displaced comminuted fracture of left patella, subsequent encounter for closed fracture with routine healing: Secondary | ICD-10-CM | POA: Diagnosis not present

## 2023-03-14 DIAGNOSIS — R488 Other symbolic dysfunctions: Secondary | ICD-10-CM | POA: Diagnosis not present

## 2023-03-14 DIAGNOSIS — R498 Other voice and resonance disorders: Secondary | ICD-10-CM | POA: Diagnosis not present

## 2023-03-14 DIAGNOSIS — R1312 Dysphagia, oropharyngeal phase: Secondary | ICD-10-CM | POA: Diagnosis not present

## 2023-03-14 DIAGNOSIS — J9611 Chronic respiratory failure with hypoxia: Secondary | ICD-10-CM | POA: Diagnosis not present

## 2023-03-16 ENCOUNTER — Other Ambulatory Visit: Payer: Self-pay | Admitting: Family Medicine

## 2023-03-16 ENCOUNTER — Non-Acute Institutional Stay (SKILLED_NURSING_FACILITY): Payer: 59 | Admitting: Adult Health

## 2023-03-16 ENCOUNTER — Encounter: Payer: Self-pay | Admitting: Adult Health

## 2023-03-16 DIAGNOSIS — R498 Other voice and resonance disorders: Secondary | ICD-10-CM | POA: Diagnosis not present

## 2023-03-16 DIAGNOSIS — J439 Emphysema, unspecified: Secondary | ICD-10-CM | POA: Diagnosis not present

## 2023-03-16 DIAGNOSIS — I700 Atherosclerosis of aorta: Secondary | ICD-10-CM

## 2023-03-16 DIAGNOSIS — I10 Essential (primary) hypertension: Secondary | ICD-10-CM | POA: Diagnosis not present

## 2023-03-16 DIAGNOSIS — S82042D Displaced comminuted fracture of left patella, subsequent encounter for closed fracture with routine healing: Secondary | ICD-10-CM | POA: Diagnosis not present

## 2023-03-16 DIAGNOSIS — R488 Other symbolic dysfunctions: Secondary | ICD-10-CM | POA: Diagnosis not present

## 2023-03-16 DIAGNOSIS — R1312 Dysphagia, oropharyngeal phase: Secondary | ICD-10-CM | POA: Diagnosis not present

## 2023-03-16 DIAGNOSIS — J9611 Chronic respiratory failure with hypoxia: Secondary | ICD-10-CM

## 2023-03-16 NOTE — Progress Notes (Signed)
Location:  [facility] Room Number: 133 Place of Service:  SNF (31)   CODE STATUS: Full Code  Allergies  Allergen Reactions   Benzyl Alcohol Itching   Amlodipine Swelling    Swelling in feet   Aspirin Other (See Comments)    Upset stomach.   Other Hypertension    Hazelnuts   Penicillins Hives    Has patient had a PCN reaction causing immediate rash, facial/tongue/throat swelling, SOB or lightheadedness with hypotension: Yes Has patient had a PCN reaction causing severe rash involving mucus membranes or skin necrosis: No Has patient had a PCN reaction that required hospitalization No Has patient had a PCN reaction occurring within the last 10 years: No If all of the above answers are "NO", then may proceed with Cephalosporin use.    Sulfa Antibiotics Hives   Tylenol [Acetaminophen] Other (See Comments)    Pt reports he has had liver issues and was told not to take tylenol     Chief Complaint  Patient presents with   Medical Management of Chronic Issues                     Closed displaced comminuted fracture of left patella sequela:   Aortic atherosclerosis  Essential hypertension:   Pulmonary emphysema unspecified emphysema/chronic respiratory failure with hypoxia :     HPI:  He is a 82 year old long term resident of this facility being seen for the management of his chronic illnesses: Closed displaced comminuted fracture of left patella sequela:   Aortic atherosclerosis  Essential hypertension:   Pulmonary emphysema unspecified emphysema/chronic respiratory failure with hypoxia. There are no reports of uncontrolled pain. He does get out of bed daily. There are no reports of anxiety or depressive thoughts.   Past Medical History:  Diagnosis Date   Arthritis    Chronic rhinitis    Complication of anesthesia    patient usually hs to be intubated with anesthesia   COPD (chronic obstructive pulmonary disease)    PFT 06-26-09 FEV1  1.75( 71%), FVC 3.65(  101%), FEV1% 48, TLC 5.53(104%), DLCO 55%, no BD   Dyspnea    Hypertension    Nasal septal deviation    Nocturia    On home O2    2L N/C   OSA (obstructive sleep apnea) 04/21/2011   Partial small bowel obstruction 10/18/2021   Skin cancer     Past Surgical History:  Procedure Laterality Date   APPENDECTOMY     BACK SURGERY     CATARACT EXTRACTION W/PHACO  01/06/2013   Procedure: CATARACT EXTRACTION PHACO AND INTRAOCULAR LENS PLACEMENT (IOC);  Surgeon: Gemma Payor, MD;  Location: [facility];  Service: Ophthalmology;  Laterality: Right;  CDE: 22.17   CATARACT EXTRACTION W/PHACO Left 06/20/2022   Procedure: CATARACT EXTRACTION PHACO AND INTRAOCULAR LENS PLACEMENT (IOC);  Surgeon: Fabio Pierce, MD;  Location: [facility];  Service: Ophthalmology;  Laterality: Left;  CDE 20.85   FEMORAL HERNIA REPAIR Right 11/19/2022   Procedure: HERNIA REPAIR FEMORAL WITH MESH;  Surgeon: Lucretia Roers, MD;  Location: [facility];  Service: General;  Laterality: Right;   INGUINAL HERNIA REPAIR Left 12/09/2021   Procedure: HERNIA REPAIR INGUINAL ADULT WITH MESH;  Surgeon: Lucretia Roers, MD;  Location: [facility];  Service: General;  Laterality: Left;   INGUINAL HERNIA REPAIR Right 03/19/2022   Procedure: HERNIA REPAIR INGUINAL ADULT WITH MESH;  Surgeon: Lucretia Roers, MD;  Location: [facility];  Service: General;  Laterality: Right;   ORIF PATELLA Left 01/22/2023   Procedure: OPEN REDUCTION INTERNAL FIXATION (ORIF) PATELLA;  Surgeon: [name], MD;  Location: [facility];  Service: Orthopedics;  Laterality: Left;    Social History   Socioeconomic History   Marital status: Divorced    Spouse name: Not on file   Number of children: 3   Years of education: Not on file   Highest education level: Not on file  Occupational History   Occupation: Armed forces technical officer: RETIRED  Tobacco Use   Smoking status: Former    Packs/day: 1.50    Years: 54.00    Additional pack years: 0.00    Total pack years: 81.00     Types: Cigarettes    Quit date: 12/01/2001    Years since quitting: 21.3   Smokeless tobacco: Never  Vaping Use   Vaping Use: Never used  Substance and Sexual Activity   Alcohol use: No   Drug use: No   Sexual activity: Not on file  Other Topics Concern   Not on file  Social History Narrative   Lives with sister.    Social Determinants of Health   Financial Resource Strain: Low Risk  (02/07/2022)   Overall Financial Resource Strain (CARDIA)    Difficulty of Paying Living Expenses: Not hard at all  Food Insecurity: No Food Insecurity (01/20/2023)   Hunger Vital Sign    Worried About Running Out of Food in the Last Year: Never true    Ran Out of Food in the Last Year: Never true  Transportation Needs: No Transportation Needs (01/20/2023)   PRAPARE - Administrator, Civil Service (Medical): No    Lack of Transportation (Non-Medical): No  Physical Activity: Insufficiently Active (02/07/2022)   Exercise Vital Sign    Days of Exercise per Week: 3 days    Minutes of Exercise per Session: 20 min  Stress: No Stress Concern Present (02/07/2022)   [employer] of Occupational Health - Occupational Stress Questionnaire    Feeling of Stress : Not at all  Social Connections: Socially Isolated (02/07/2022)   Social Connection and Isolation Panel [NHANES]    Frequency of Communication with Friends and Family: More than three times a week    Frequency of Social Gatherings with Friends and Family: More than three times a week    Attends Religious Services: Never    Database administrator or Organizations: No    Attends Banker Meetings: Never    Marital Status: Divorced  Catering manager Violence: Not At Risk (01/20/2023)   Humiliation, Afraid, Rape, and Kick questionnaire    Fear of Current or Ex-Partner: No    Emotionally Abused: No    Physically Abused: No    Sexually Abused: No   Family History  Problem Relation Age of Onset   Lung cancer Mother        VITAL SIGNS BP 116/68   Pulse 77   Temp (!) 97.1 F (36.2 C)   Resp 20   Ht 5\' 5"  (1.651 m)   Wt 127 lb 8 oz (57.8 kg)   SpO2 94%   BMI 21.22 kg/m   Outpatient Encounter Medications as of 03/16/2023  Medication Sig   albuterol (VENTOLIN HFA) 108 (90 Base) MCG/ACT inhaler Inhale 2 puffs into the lungs every 6 (six) hours as needed for wheezing or shortness of breath.   Albuterol Sulfate 2.5 MG/0.5ML NEBU Inhale 1 each into the lungs 3 (three)  times daily.   Amino Acids-Protein Hydrolys (FEEDING SUPPLEMENT, PRO-STAT SUGAR FREE 64,) LIQD Take 30 mLs by mouth 3 (three) times daily with meals.   ascorbic acid (VITAMIN C) 500 MG tablet Take 1,000 mg by mouth daily.   Budeson-Glycopyrrol-Formoterol (BREZTRI AEROSPHERE) 160-9-4.8 MCG/ACT AERO Inhale 2 puffs into the lungs in the morning and at bedtime.   cholecalciferol (VITAMIN D) 25 MCG (1000 UNIT) tablet Take 3,000 Units by mouth daily.   feeding supplement (ENSURE ENLIVE / ENSURE PLUS) LIQD Take 237 mLs by mouth 3 (three) times daily between meals.   ferrous sulfate 325 (65 FE) MG EC tablet Take 325 mg by mouth every Monday, Wednesday, and Friday.   finasteride (PROSCAR) 5 MG tablet Take 1 tablet (5 mg total) by mouth daily.   guaiFENesin-dextromethorphan (ROBITUSSIN DM) 100-10 MG/5ML syrup Take 10 mLs by mouth every 4 (four) hours as needed for cough.   ipratropium-albuterol (DUONEB) 0.5-2.5 (3) MG/3ML SOLN Take 3 mLs by nebulization every 6 (six) hours as needed (wheezing, shortness of breath).   losartan (COZAAR) 25 MG tablet Take 12.5 mg by mouth daily.   methocarbamol (ROBAXIN) 500 MG tablet Take 500 mg by mouth 3 (three) times daily.   Multiple Vitamin (MULTIVITAMIN) tablet Take 1 tablet by mouth daily. Men's health   Nebulizer MISC 1 each by Does not apply route daily as needed. PLEASE PROVIDE NEBULIZER KIT TO INCLUDE TUBING/MASK/MOUTHPIECE   Omega-3 Fatty Acids (FISH OIL CONCENTRATE PO) Take 1,000 mg by mouth daily.    OXYGEN Inhale 2 L into the lungs as directed. Every Shift; Day, Evening, Night   pantoprazole (PROTONIX) 40 MG tablet Take 1 tablet (40 mg total) by mouth daily.   polyethylene glycol (MIRALAX / GLYCOLAX) 17 g packet Take 17 g by mouth daily as needed.   pyridoxine (B-6) 100 MG tablet Take 100 mg by mouth daily.   tamsulosin (FLOMAX) 0.4 MG CAPS capsule Take 2 capsules (0.8 mg total) by mouth daily.   UNABLE TO FIND Diet: Dysphagia 3/Thin Liquids, Consistent carbohydrate   zinc gluconate 50 MG tablet Take 50 mg by mouth daily.   [DISCONTINUED] cyanocobalamin 1000 MCG tablet Take 2,000 mcg by mouth daily.   [DISCONTINUED] doxycycline (ADOXA) 100 MG tablet Take 100 mg by mouth 2 (two) times daily.   No facility-administered encounter medications on file as of 03/16/2023.     SIGNIFICANT DIAGNOSTIC EXAMS  LABS REVIEWED; PREVIOUS   01-20-23; wbc 7.9 hgb 15.0; hct 45.6; mcv 89.2 plt 246; glucose 118 bun 31; creat 0.96; k+ 4.2; na++ 140; ca 10.4; gfr >60; protein 7.0; albumin 3.9 01-23-23; wbc 13.0; hgb 11.3; hct 34.0; mcv 90.7 plt 166;glucose 149; bun 17; creat 0.84; k+ 3.9; na++ 134; ca 8.6; gfr >60; phos 2.4 albumin 2.7; mag 1.9; vitamin B12: 837; folate 15.5; iron 19; tibc 320; ferritin 72.  01-25-23: hgb A1c 6.8 01-27-23: wbc 11.0; hgb 11.2; hct 34.4; mcv 88.9 plt 215; glucose 169; bun 24; creat 0.72; k+ 3.4; na++ 138; ca 8.9; gfr >60; protein 5.6; albumin 2.5; mag 1.9  TODAY  01-29-23; wbc 11.7; hgb 11.5; hct 35.8; mcv 90.4 plt 235; glucose 117; bun 32; creat 0.88; k+ 3.5; na++ 138; ca 8.6; gfr >60; protein 5.7 albumin 2.6 mag 2.1 03-12-23 wbc 7.8; hgb 12.3; hct 38.6; mcv 91.3 plt 289; glucose 114; bun 24; creat 0.89; k+ 4.0; na++ 135; ca 9.1 gfr >60; protein 6.6 albumin 3.1 tsh 3.506; vitamin B 12: 1393 rpr: nr    Review of Systems  Constitutional:  Negative for malaise/fatigue.  Respiratory:  Negative for cough and shortness of breath.   Cardiovascular:  Negative for chest pain,  palpitations and leg swelling.  Gastrointestinal:  Negative for abdominal pain, constipation and heartburn.  Musculoskeletal:  Negative for back pain, joint pain and myalgias.  Skin: Negative.   Neurological:  Negative for dizziness.  Psychiatric/Behavioral:  The patient is not nervous/anxious.    Physical Exam Constitutional:      General: He is not in acute distress.    Appearance: He is well-developed. He is not diaphoretic.  Neck:     Thyroid: No thyromegaly.  Cardiovascular:     Rate and Rhythm: Normal rate and regular rhythm.     Pulses: Normal pulses.     Heart sounds: Normal heart sounds.  Pulmonary:     Effort: Pulmonary effort is normal. No respiratory distress.     Breath sounds: Normal breath sounds.  Abdominal:     General: Bowel sounds are normal. There is no distension.     Palpations: Abdomen is soft.     Tenderness: There is no abdominal tenderness.  Musculoskeletal:        General: Normal range of motion.     Cervical back: Neck supple.  Lymphadenopathy:     Cervical: No cervical adenopathy.  Skin:    General: Skin is warm and dry.  Neurological:     Mental Status: He is alert. Mental status is at baseline.  Psychiatric:        Mood and Affect: Mood normal.       ASSESSMENT/ PLAN:  TODAY  Closed displaced comminuted fracture of left patella sequela: will continue to monitor his status  2. Aortic atherosclerosis (ct 05-23-22)   3. Essential hypertension: b/p 116/68 is on asa 81 mg daily   4. Pulmonary emphysema unspecified emphysema/chronic respiratory failure with hypoxia : has 81 pack year history   will continue breztri aerosphere 160-9-4.8 mcg 2 puffs twice daily albuterol neb or 2 puffs every 6 hours as needed.   PREVIOUS   5. Gastroesophageal reflux disease without esophagitis: will continue protonix 40 mg daily   6. BPH with obstruction/urinary symptoms: will continue proscar 5 mg daily and flomax 0.8 mg daily   7. Protein calorie  malnutrition severe: protein 5.6 albumin 2.5 will continue ensure three times daily and  prostat 30 mL three times daily   8. Chronic constipation: will continue miralax daily   9. Acute blood loss anemia/iron deficiency anemia: hgb 11.2 iron 19  iron three days weekly; is status post iron infusion at hospital.   10. Hyperglycemia steroid induced: hgb A1c is 6.8; will monitor    11. Vascular dementia without behavioral disturbance: weight is 127 pounds; will monitor      Synthia Innocent NP [facility]   call [phone]

## 2023-03-17 DIAGNOSIS — S82042D Displaced comminuted fracture of left patella, subsequent encounter for closed fracture with routine healing: Secondary | ICD-10-CM | POA: Diagnosis not present

## 2023-03-17 DIAGNOSIS — R498 Other voice and resonance disorders: Secondary | ICD-10-CM | POA: Diagnosis not present

## 2023-03-17 DIAGNOSIS — R488 Other symbolic dysfunctions: Secondary | ICD-10-CM | POA: Diagnosis not present

## 2023-03-17 DIAGNOSIS — R1312 Dysphagia, oropharyngeal phase: Secondary | ICD-10-CM | POA: Diagnosis not present

## 2023-03-17 DIAGNOSIS — J9611 Chronic respiratory failure with hypoxia: Secondary | ICD-10-CM | POA: Diagnosis not present

## 2023-03-17 NOTE — Telephone Encounter (Signed)
Requested medications are due for refill today.  yes  Requested medications are on the active medications list.  yes  Last refill. 12/19/2022 #60 3 rf  Future visit scheduled.   no  Notes to clinic.  Labs are expired.     Requested Prescriptions  Pending Prescriptions Disp Refills   tamsulosin (FLOMAX) 0.4 MG CAPS capsule [Pharmacy Med Name: TAMSULOSIN HCL 0.4 MG CAPSULE] 180 capsule 1    Sig: TAKE 2 CAPSULES BY MOUTH EVERY DAY     Urology: Alpha-Adrenergic Blocker Failed - 03/16/2023  2:15 AM      Failed - PSA in normal range and within 360 days    PSA  Date Value Ref Range Status  11/11/2018 1.2 < OR = 4.0 ng/mL Final    Comment:    The total PSA value from this assay system is  standardized against the WHO standard. The test  result will be approximately 20% lower when compared  to the equimolar-standardized total PSA (Beckman  Coulter). Comparison of serial PSA results should be  interpreted with this fact in mind. . This test was performed using the Siemens  chemiluminescent method. Values obtained from  different assay methods cannot be used interchangeably. PSA levels, regardless of value, should not be interpreted as absolute evidence of the presence or absence of disease.          Failed - Valid encounter within last 12 months    Recent Outpatient Visits           1 year ago Callus of palm of hand   [facility] Pickard, Priscille Heidelberg, MD   1 year ago COPD exacerbation [facility])   [facility] Tanya Nones, Priscille Heidelberg, MD   1 year ago COPD exacerbation [facility])   [facility] Tanya Nones, Priscille Heidelberg, MD   1 year ago Hyperkalemia   [facility] Tanya Nones, Priscille Heidelberg, MD   1 year ago AK (actinic keratosis)   [facility] Pickard, Priscille Heidelberg, MD       Future Appointments             In 4 weeks [name], MD [facility]            Passed - Last BP in normal range    BP  Readings from Last 1 Encounters:  03/16/23 116/68

## 2023-03-23 ENCOUNTER — Encounter: Payer: Self-pay | Admitting: Adult Health

## 2023-03-23 ENCOUNTER — Non-Acute Institutional Stay (SKILLED_NURSING_FACILITY): Payer: 59 | Admitting: Adult Health

## 2023-03-23 DIAGNOSIS — J441 Chronic obstructive pulmonary disease with (acute) exacerbation: Secondary | ICD-10-CM

## 2023-03-23 NOTE — Progress Notes (Unsigned)
Location:  [facility] Room Number: 107D Place of Service:  SNF (31)   CODE STATUS: Full Code  Allergies  Allergen Reactions   Benzyl Alcohol Itching   Amlodipine Swelling    Swelling in feet   Aspirin Other (See Comments)    Upset stomach.   Other Hypertension    Hazelnuts   Penicillins Hives    Has patient had a PCN reaction causing immediate rash, facial/tongue/throat swelling, SOB or lightheadedness with hypotension: Yes Has patient had a PCN reaction causing severe rash involving mucus membranes or skin necrosis: No Has patient had a PCN reaction that required hospitalization No Has patient had a PCN reaction occurring within the last 10 years: No If all of the above answers are "NO", then may proceed with Cephalosporin use.    Sulfa Antibiotics Hives   Tylenol [Acetaminophen] Other (See Comments)    Pt reports he has had liver issues and was told not to take tylenol     Chief Complaint  Patient presents with   Acute Visit    Cough and Congestion    HPI:  Staff reports that he is having a worsening cough with yellow sputum production. There are no fevers present. He tells me that he is more short of breath; no chest pain; no wheezing.   Past Medical History:  Diagnosis Date   Arthritis    Chronic rhinitis    Complication of anesthesia    patient usually hs to be intubated with anesthesia   COPD (chronic obstructive pulmonary disease)    PFT 06-26-09 FEV1  1.75( 71%), FVC 3.65( 101%), FEV1% 48, TLC 5.53(104%), DLCO 55%, no BD   Dyspnea    Hypertension    Nasal septal deviation    Nocturia    On home O2    2L N/C   OSA (obstructive sleep apnea) 04/21/2011   Partial small bowel obstruction 10/18/2021   Skin cancer     Past Surgical History:  Procedure Laterality Date   APPENDECTOMY     BACK SURGERY     CATARACT EXTRACTION W/PHACO  01/06/2013   Procedure: CATARACT EXTRACTION PHACO AND INTRAOCULAR LENS PLACEMENT (IOC);  Surgeon: Gemma Payor, MD;  Location: [facility];  Service: Ophthalmology;  Laterality: Right;  CDE: 22.17   CATARACT EXTRACTION W/PHACO Left 06/20/2022   Procedure: CATARACT EXTRACTION PHACO AND INTRAOCULAR LENS PLACEMENT (IOC);  Surgeon: Fabio Pierce, MD;  Location: [facility];  Service: Ophthalmology;  Laterality: Left;  CDE 20.85   FEMORAL HERNIA REPAIR Right 11/19/2022   Procedure: HERNIA REPAIR FEMORAL WITH MESH;  Surgeon: Lucretia Roers, MD;  Location: [facility];  Service: General;  Laterality: Right;   INGUINAL HERNIA REPAIR Left 12/09/2021   Procedure: HERNIA REPAIR INGUINAL ADULT WITH MESH;  Surgeon: Lucretia Roers, MD;  Location: [facility];  Service: General;  Laterality: Left;   INGUINAL HERNIA REPAIR Right 03/19/2022   Procedure: HERNIA REPAIR INGUINAL ADULT WITH MESH;  Surgeon: Lucretia Roers, MD;  Location: [facility];  Service: General;  Laterality: Right;   ORIF PATELLA Left 01/22/2023   Procedure: OPEN REDUCTION INTERNAL FIXATION (ORIF) PATELLA;  Surgeon: [name], MD;  Location: [facility];  Service: Orthopedics;  Laterality: Left;    Social History   Socioeconomic History   Marital status: Divorced    Spouse name: Not on file   Number of children: 3   Years of education: Not on file   Highest education level: Not on file  Occupational History  Occupation: Armed forces technical officer: RETIRED  Tobacco Use   Smoking status: Former    Packs/day: 1.50    Years: 54.00    Additional pack years: 0.00    Total pack years: 81.00    Types: Cigarettes    Quit date: 12/01/2001    Years since quitting: 21.3   Smokeless tobacco: Never  Vaping Use   Vaping Use: Never used  Substance and Sexual Activity   Alcohol use: No   Drug use: No   Sexual activity: Not on file  Other Topics Concern   Not on file  Social History Narrative   Lives with sister.    Social Determinants of Health   Financial Resource Strain: Low Risk  (02/07/2022)   Overall Financial Resource Strain (CARDIA)    Difficulty of  Paying Living Expenses: Not hard at all  Food Insecurity: No Food Insecurity (01/20/2023)   Hunger Vital Sign    Worried About Running Out of Food in the Last Year: Never true    Ran Out of Food in the Last Year: Never true  Transportation Needs: No Transportation Needs (01/20/2023)   PRAPARE - Administrator, Civil Service (Medical): No    Lack of Transportation (Non-Medical): No  Physical Activity: Insufficiently Active (02/07/2022)   Exercise Vital Sign    Days of Exercise per Week: 3 days    Minutes of Exercise per Session: 20 min  Stress: No Stress Concern Present (02/07/2022)   [employer] of Occupational Health - Occupational Stress Questionnaire    Feeling of Stress : Not at all  Social Connections: Socially Isolated (02/07/2022)   Social Connection and Isolation Panel [NHANES]    Frequency of Communication with Friends and Family: More than three times a week    Frequency of Social Gatherings with Friends and Family: More than three times a week    Attends Religious Services: Never    Database administrator or Organizations: No    Attends Banker Meetings: Never    Marital Status: Divorced  Catering manager Violence: Not At Risk (01/20/2023)   Humiliation, Afraid, Rape, and Kick questionnaire    Fear of Current or Ex-Partner: No    Emotionally Abused: No    Physically Abused: No    Sexually Abused: No   Family History  Problem Relation Age of Onset   Lung cancer Mother       VITAL SIGNS BP (!) 116/52   Pulse 60   Temp 98 F (36.7 C)   Resp 20   Ht  (1.651 m)   Wt 127 lb 8 oz (57.8 kg)   SpO2 95%   BMI 21.22 kg/m   Outpatient Encounter Medications as of 03/23/2023  Medication Sig   albuterol (VENTOLIN HFA) 108 (90 Base) MCG/ACT inhaler Inhale 2 puffs into the lungs every 6 (six) hours as needed for wheezing or shortness of breath.   Albuterol Sulfate 2.5 MG/0.5ML NEBU Inhale 1 each into the lungs 3 (three) times daily.   Amino  Acids-Protein Hydrolys (FEEDING SUPPLEMENT, PRO-STAT SUGAR FREE 64,) LIQD Take 30 mLs by mouth 3 (three) times daily with meals.   ascorbic acid (VITAMIN C) 500 MG tablet Take 1,000 mg by mouth daily.   Budeson-Glycopyrrol-Formoterol (BREZTRI AEROSPHERE) 160-9-4.8 MCG/ACT AERO Inhale 2 puffs into the lungs in the morning and at bedtime.   cholecalciferol (VITAMIN D) 25 MCG (1000 UNIT) tablet Take 3,000 Units by mouth daily.   feeding supplement (ENSURE ENLIVE /  ENSURE PLUS) LIQD Take 237 mLs by mouth 3 (three) times daily between meals.   ferrous sulfate 325 (65 FE) MG EC tablet Take 325 mg by mouth every Monday, Wednesday, and Friday.   finasteride (PROSCAR) 5 MG tablet Take 1 tablet (5 mg total) by mouth daily.   guaiFENesin-dextromethorphan (ROBITUSSIN DM) 100-10 MG/5ML syrup Take 10 mLs by mouth every 4 (four) hours as needed for cough.   ipratropium-albuterol (DUONEB) 0.5-2.5 (3) MG/3ML SOLN Take 3 mLs by nebulization every 6 (six) hours as needed (wheezing, shortness of breath).   losartan (COZAAR) 25 MG tablet Take 12.5 mg by mouth daily.   methocarbamol (ROBAXIN) 500 MG tablet Take 500 mg by mouth 3 (three) times daily.   Multiple Vitamin (MULTIVITAMIN) tablet Take 1 tablet by mouth daily. Men's health   Nebulizer MISC 1 each by Does not apply route daily as needed. PLEASE PROVIDE NEBULIZER KIT TO INCLUDE TUBING/MASK/MOUTHPIECE   Omega-3 Fatty Acids (FISH OIL CONCENTRATE PO) Take 1,000 mg by mouth daily.   OXYGEN Inhale 2 L into the lungs as directed. Every Shift; Day, Evening, Night   pantoprazole (PROTONIX) 40 MG tablet Take 1 tablet (40 mg total) by mouth daily.   polyethylene glycol (MIRALAX / GLYCOLAX) 17 g packet Take 17 g by mouth daily as needed.   pyridoxine (B-6) 100 MG tablet Take 100 mg by mouth daily.   tamsulosin (FLOMAX) 0.4 MG CAPS capsule Take 2 capsules (0.8 mg total) by mouth daily.   UNABLE TO FIND Diet: Dysphagia 3/Thin Liquids, Consistent carbohydrate   zinc  gluconate 50 MG tablet Take 50 mg by mouth daily.   No facility-administered encounter medications on file as of 03/23/2023.     SIGNIFICANT DIAGNOSTIC EXAMS  LABS REVIEWED; PREVIOUS   01-20-23; wbc 7.9 hgb 15.0; hct 45.6; mcv 89.2 plt 246; glucose 118 bun 31; creat 0.96; k+ 4.2; na++ 140; ca 10.4; gfr >60; protein 7.0; albumin 3.9 01-23-23; wbc 13.0; hgb 11.3; hct 34.0; mcv 90.7 plt 166;glucose 149; bun 17; creat 0.84; k+ 3.9; na++ 134; ca 8.6; gfr >60; phos 2.4 albumin 2.7; mag 1.9; vitamin B12: 837; folate 15.5; iron 19; tibc 320; ferritin 72.  01-25-23: hgb A1c 6.8 01-27-23: wbc 11.0; hgb 11.2; hct 34.4; mcv 88.9 plt 215; glucose 169; bun 24; creat 0.72; k+ 3.4; na++ 138; ca 8.9; gfr >60; protein 5.6; albumin 2.5; mag 1.9 01-29-23; wbc 11.7; hgb 11.5; hct 35.8; mcv 90.4 plt 235; glucose 117; bun 32; creat 0.88; k+ 3.5; na++ 138; ca 8.6; gfr >60; protein 5.7 albumin 2.6 mag 2.1 03-12-23 wbc 7.8; hgb 12.3; hct 38.6; mcv 91.3 plt 289; glucose 114; bun 24; creat 0.89; k+ 4.0; na++ 135; ca 9.1 gfr >60; protein 6.6 albumin 3.1 tsh 3.506; vitamin B 12: 1393 rpr: nr   NO NEW LABS.   Review of Systems  Constitutional:  Negative for malaise/fatigue.  Respiratory:  Positive for cough, sputum production and shortness of breath.   Cardiovascular:  Negative for chest pain, palpitations and leg swelling.  Gastrointestinal:  Negative for abdominal pain, constipation and heartburn.  Musculoskeletal:  Negative for back pain, joint pain and myalgias.  Skin: Negative.   Neurological:  Negative for dizziness.  Psychiatric/Behavioral:  The patient is not nervous/anxious.     Physical Exam Constitutional:      General: He is not in acute distress.    Appearance: He is well-developed. He is not diaphoretic.  Neck:     Thyroid: No thyromegaly.  Cardiovascular:  Rate and Rhythm: Normal rate and regular rhythm.     Pulses: Normal pulses.     Heart sounds: Normal heart sounds.  Pulmonary:     Effort:  Pulmonary effort is normal. No respiratory distress.     Breath sounds: Wheezing and rhonchi present.  Abdominal:     General: Bowel sounds are normal. There is no distension.     Palpations: Abdomen is soft.     Tenderness: There is no abdominal tenderness.  Musculoskeletal:        General: Normal range of motion.     Cervical back: Neck supple.     Right lower leg: No edema.     Left lower leg: No edema.  Lymphadenopathy:     Cervical: No cervical adenopathy.  Skin:    General: Skin is warm and dry.  Neurological:     Mental Status: He is alert. Mental status is at baseline.  Psychiatric:        Mood and Affect: Mood normal.      ASSESSMENT/ PLAN:  TODAY  COPD with acute exacerbation: will change duoneb to every 6 hours through 03-28-23; wil begin doxycycline 100 mg twice daily through 03-28-23. Will monitor his status    Synthia Innocent NP [facility]   call [phone]

## 2023-03-24 ENCOUNTER — Non-Acute Institutional Stay (SKILLED_NURSING_FACILITY): Payer: 59 | Admitting: Adult Health

## 2023-03-24 ENCOUNTER — Encounter: Payer: Self-pay | Admitting: Adult Health

## 2023-03-24 DIAGNOSIS — J441 Chronic obstructive pulmonary disease with (acute) exacerbation: Secondary | ICD-10-CM | POA: Insufficient documentation

## 2023-03-24 DIAGNOSIS — Z0000 Encounter for general adult medical examination without abnormal findings: Secondary | ICD-10-CM | POA: Diagnosis not present

## 2023-03-24 NOTE — Progress Notes (Signed)
Subjective:   Michael Evans is a 82 y.o. male who presents for Medicare Annual/Subsequent preventive examination.  Review of Systems    Review of Systems  Constitutional:  Negative for malaise/fatigue.  Respiratory:  Positive for cough, sputum production, shortness of breath and wheezing.   Cardiovascular:  Negative for chest pain, palpitations and leg swelling.  Gastrointestinal:  Negative for abdominal pain, constipation and heartburn.  Musculoskeletal:  Negative for back pain, joint pain and myalgias.  Skin: Negative.   Neurological:  Negative for dizziness.  Psychiatric/Behavioral:  The patient is not nervous/anxious.     Cardiac Risk Factors include: advanced age (>63men, >75 women);male gender;sedentary lifestyle     Objective:    Today's Vitals   03/24/23 0925  BP: (!) 116/52  Pulse: 60  Resp: 20  Temp: 98 F (36.7 C)  SpO2: 95%  Weight: 127 lb 12.8 oz (58 kg)  Height: 5\' 5"  (1.651 m)   Body mass index is 21.27 kg/m.     03/24/2023    9:34 AM 03/23/2023   12:25 PM 03/16/2023    8:56 AM 02/04/2023   11:22 AM 01/30/2023   11:00 AM 01/22/2023    1:12 PM 01/20/2023   10:39 PM  Advanced Directives  Does Patient Have a Medical Advance Directive? No No No No No Yes Yes  Type of Advance Directive      Living will Living will  Does patient want to make changes to medical advance directive? No - Patient declined No - Patient declined No - Patient declined No - Patient declined No - Patient declined No - Guardian declined No - Patient declined    Current Medications (verified) Outpatient Encounter Medications as of 03/24/2023  Medication Sig   albuterol (VENTOLIN HFA) 108 (90 Base) MCG/ACT inhaler Inhale 2 puffs into the lungs every 6 (six) hours as needed for wheezing or shortness of breath.   Albuterol Sulfate 2.5 MG/0.5ML NEBU Inhale 1 each into the lungs 3 (three) times daily.   Amino Acids-Protein Hydrolys (FEEDING SUPPLEMENT, PRO-STAT SUGAR FREE 64,) LIQD Take 30 mLs  by mouth 3 (three) times daily with meals.   ascorbic acid (VITAMIN C) 500 MG tablet Take 1,000 mg by mouth daily.   Budeson-Glycopyrrol-Formoterol (BREZTRI AEROSPHERE) 160-9-4.8 MCG/ACT AERO Inhale 2 puffs into the lungs in the morning and at bedtime.   cholecalciferol (VITAMIN D) 25 MCG (1000 UNIT) tablet Take 3,000 Units by mouth daily.   DOXYCYCLINE HYCLATE PO Take 100 mg by mouth in the morning and at bedtime.   feeding supplement (ENSURE ENLIVE / ENSURE PLUS) LIQD Take 237 mLs by mouth 3 (three) times daily between meals.   ferrous sulfate 325 (65 FE) MG EC tablet Take 325 mg by mouth every Monday, Wednesday, and Friday.   finasteride (PROSCAR) 5 MG tablet Take 1 tablet (5 mg total) by mouth daily.   guaiFENesin-dextromethorphan (ROBITUSSIN DM) 100-10 MG/5ML syrup Take 10 mLs by mouth every 4 (four) hours as needed for cough.   ipratropium-albuterol (DUONEB) 0.5-2.5 (3) MG/3ML SOLN Take 3 mLs by nebulization every 6 (six) hours as needed (wheezing, shortness of breath).   ipratropium-albuterol (DUONEB) 0.5-2.5 (3) MG/3ML SOLN Take 3 mLs by nebulization every 6 (six) hours.   losartan (COZAAR) 25 MG tablet Take 12.5 mg by mouth daily.   methocarbamol (ROBAXIN) 500 MG tablet Take 500 mg by mouth 3 (three) times daily.   Multiple Vitamin (MULTIVITAMIN) tablet Take 1 tablet by mouth daily. Men's health   Omega-3 Fatty Acids (FISH  OIL CONCENTRATE PO) Take 1,000 mg by mouth daily.   OXYGEN Inhale 2 L into the lungs as directed. Every Shift; Day, Evening, Night   pantoprazole (PROTONIX) 40 MG tablet Take 1 tablet (40 mg total) by mouth daily.   polyethylene glycol (MIRALAX / GLYCOLAX) 17 g packet Take 17 g by mouth daily as needed.   pyridoxine (B-6) 100 MG tablet Take 100 mg by mouth daily.   tamsulosin (FLOMAX) 0.4 MG CAPS capsule Take 2 capsules (0.8 mg total) by mouth daily.   UNABLE TO FIND Diet: Dysphagia 3/Thin Liquids, Consistent carbohydrate   ZINC SULFATE PO Take 50 mg by mouth daily.    [DISCONTINUED] Nebulizer MISC 1 each by Does not apply route daily as needed. PLEASE PROVIDE NEBULIZER KIT TO INCLUDE TUBING/MASK/MOUTHPIECE   [DISCONTINUED] zinc gluconate 50 MG tablet Take 50 mg by mouth daily.   No facility-administered encounter medications on file as of 03/24/2023.    Allergies (verified) Benzyl alcohol, Amlodipine, Aspirin, Other, Penicillins, Sulfa antibiotics, and Tylenol [acetaminophen]   History: Past Medical History:  Diagnosis Date   Arthritis    Chronic rhinitis    Complication of anesthesia    patient usually hs to be intubated with anesthesia   COPD (chronic obstructive pulmonary disease)    PFT 06-26-09 FEV1  1.75( 71%), FVC 3.65( 101%), FEV1% 48, TLC 5.53(104%), DLCO 55%, no BD   Dyspnea    Hypertension    Nasal septal deviation    Nocturia    On home O2    2L N/C   OSA (obstructive sleep apnea) 04/21/2011   Partial small bowel obstruction 10/18/2021   Skin cancer    Past Surgical History:  Procedure Laterality Date   APPENDECTOMY     BACK SURGERY     CATARACT EXTRACTION W/PHACO  01/06/2013   Procedure: CATARACT EXTRACTION PHACO AND INTRAOCULAR LENS PLACEMENT (IOC);  Surgeon: Gemma Payor, MD;  Location: [facility];  Service: Ophthalmology;  Laterality: Right;  CDE: 22.17   CATARACT EXTRACTION W/PHACO Left 06/20/2022   Procedure: CATARACT EXTRACTION PHACO AND INTRAOCULAR LENS PLACEMENT (IOC);  Surgeon: Fabio Pierce, MD;  Location: [facility];  Service: Ophthalmology;  Laterality: Left;  CDE 20.85   FEMORAL HERNIA REPAIR Right 11/19/2022   Procedure: HERNIA REPAIR FEMORAL WITH MESH;  Surgeon: Lucretia Roers, MD;  Location: [facility];  Service: General;  Laterality: Right;   INGUINAL HERNIA REPAIR Left 12/09/2021   Procedure: HERNIA REPAIR INGUINAL ADULT WITH MESH;  Surgeon: Lucretia Roers, MD;  Location: [facility];  Service: General;  Laterality: Left;   INGUINAL HERNIA REPAIR Right 03/19/2022   Procedure: HERNIA REPAIR INGUINAL ADULT WITH MESH;   Surgeon: Lucretia Roers, MD;  Location: [facility];  Service: General;  Laterality: Right;   ORIF PATELLA Left 01/22/2023   Procedure: OPEN REDUCTION INTERNAL FIXATION (ORIF) PATELLA;  Surgeon: [name], MD;  Location: [facility];  Service: Orthopedics;  Laterality: Left;   Family History  Problem Relation Age of Onset   Lung cancer Mother    Social History   Socioeconomic History   Marital status: Divorced    Spouse name: Not on file   Number of children: 3   Years of education: Not on file   Highest education level: Not on file  Occupational History   Occupation: Armed forces technical officer: RETIRED  Tobacco Use   Smoking status: Former    Packs/day: 1.50    Years: 54.00    Additional pack years: 0.00  Total pack years: 81.00    Types: Cigarettes    Quit date: 12/01/2001    Years since quitting: 21.3   Smokeless tobacco: Never  Vaping Use   Vaping Use: Never used  Substance and Sexual Activity   Alcohol use: No   Drug use: No   Sexual activity: Not on file  Other Topics Concern   Not on file  Social History Narrative   Lives with sister.    Social Determinants of Health   Financial Resource Strain: Low Risk  (02/07/2022)   Overall Financial Resource Strain (CARDIA)    Difficulty of Paying Living Expenses: Not hard at all  Food Insecurity: No Food Insecurity (01/20/2023)   Hunger Vital Sign    Worried About Running Out of Food in the Last Year: Never true    Ran Out of Food in the Last Year: Never true  Transportation Needs: No Transportation Needs (01/20/2023)   PRAPARE - Administrator, Civil Service (Medical): No    Lack of Transportation (Non-Medical): No  Physical Activity: Insufficiently Active (02/07/2022)   Exercise Vital Sign    Days of Exercise per Week: 3 days    Minutes of Exercise per Session: 20 min  Stress: No Stress Concern Present (02/07/2022)   [employer] of Occupational Health - Occupational Stress Questionnaire    Feeling of  Stress : Not at all  Social Connections: Socially Isolated (02/07/2022)   Social Connection and Isolation Panel [NHANES]    Frequency of Communication with Friends and Family: More than three times a week    Frequency of Social Gatherings with Friends and Family: More than three times a week    Attends Religious Services: Never    Database administrator or Organizations: No    Attends Engineer, structural: Never    Marital Status: Divorced    Tobacco Counseling Counseling given: Not Answered   Clinical Intake:  Pre-visit preparation completed: Yes  Pain : No/denies pain     BMI - recorded: 21.27 Nutritional Status: BMI of 19-24  Normal Nutritional Risks: Unintentional weight loss Diabetes: No  How often do you need to have someone help you when you read instructions, pamphlets, or other written materials from your doctor or pharmacy?: 5 - Always  Diabetic?no  Interpreter Needed?: No  Comments: long term resident of [facility]   Activities of Daily Living    03/24/2023   10:44 AM 01/20/2023   10:39 PM  In your present state of health, do you have any difficulty performing the following activities:  Hearing? 0 1  Vision? 0 1  Difficulty concentrating or making decisions? 1 0  Walking or climbing stairs? 1 1  Dressing or bathing? 1 0  Doing errands, shopping? 1 0  Preparing Food and eating ? Y   Using the Toilet? Y   In the past six months, have you accidently leaked urine? Y   Do you have problems with loss of bowel control? Y   Managing your Medications? Y   Managing your Finances? Y   Housekeeping or managing your Housekeeping? Y     Patient Care Team: Sharee Holster, NP as PCP - [facility]  Indicate any recent Medical Services you may have received from other than [facility] providers in the past year (date may be approximate).     Assessment:   This is a routine wellness examination for  Zackry.  Hearing/Vision screen No results found.  Dietary issues and exercise activities discussed: Current Exercise Habits: The patient does not participate in regular exercise at present, Exercise limited by: respiratory conditions(s)   Goals Addressed             This Visit's Progress    Absence of Fall and Fall-Related Injury       Evidence-based guidance:  Assess fall risk using a validated tool when available. Consider balance and gait impairment, muscle weakness, diminished vision or hearing, environmental hazards, presence of urinary or bowel urgency and/or incontinence.  Communicate fall injury risk to interprofessional healthcare team.  Develop a fall prevention plan with the patient and family.  Promote use of personal vision and auditory aids.  Promote reorientation, appropriate sensory stimulation, and routines to decrease risk of fall when changes in mental status are present.  Assess assistance level required for safe and effective self-care; consider referral for home care.  Encourage physical activity, such as performance of self-care at highest level of ability, strength and balance exercise program, and provision of appropriate assistive devices; refer to rehabilitation therapy.  Refer to community-based fall prevention program where available.  If fall occurs, determine the cause and revise fall injury prevention plan.  Regularly review medication contribution to fall risk; consider risk related to polypharmacy and age.  Refer to pharmacist for consultation when concerns about medications are revealed.  Balance adequate pain management with potential for oversedation.  Provide guidance related to environmental modifications.  Consider supplementation with Vitamin D.   Notes:      DIET - INCREASE WATER INTAKE       General - Client will not be readmitted within 30 days ([facility])         Depression Screen    03/24/2023   10:43 AM 10/16/2022   11:32 AM  07/28/2022   12:32 PM 02/07/2022    8:57 AM 12/07/2020   11:32 AM 01/05/2020    2:35 PM 10/03/2015   11:51 AM  PHQ 2/9 Scores  PHQ - 2 Score 0 0 0 0 0 0 0  PHQ- 9 Score       0    Fall Risk    03/24/2023   10:43 AM 10/16/2022   11:32 AM 02/07/2022    9:00 AM 10/08/2021    9:20 AM 12/07/2020   11:32 AM  Fall Risk   Falls in the past year? 0 0 0 0 0  Number falls in past yr: 0 0 0  0  Injury with Fall? 0 0 0  0  Risk for fall due to : Impaired mobility;Impaired balance/gait No Fall Risks Impaired balance/gait;Impaired mobility    Follow up Falls evaluation completed Falls prevention discussed Falls prevention discussed Falls evaluation completed     FALL RISK PREVENTION PERTAINING TO THE HOME:  Any stairs in or around the home? Yes  If so, are there any without handrails? No  Home free of loose throw rugs in walkways, pet beds, electrical cords, etc? Yes  Adequate lighting in your home to reduce risk of falls? Yes   ASSISTIVE DEVICES UTILIZED TO PREVENT FALLS:  Life alert? No  Use of a cane, walker or w/c? Yes  Grab bars in the bathroom? Yes  Shower chair or bench in shower? Yes  Elevated toilet seat or a handicapped toilet? Yes   TIMED UP AND GO:  Was the test performed? No .    Cognitive Function:        02/07/2022    9:03 AM  6CIT  Screen  What Year? 0 points  What month? 0 points  What time? 0 points  Count back from 20 0 points  Months in reverse 4 points  Repeat phrase 10 points  Total Score 14 points    Immunizations Immunization History  Administered Date(s) Administered   Fluad Quad(high Dose 65+) 08/23/2019, 12/07/2020, 11/29/2021   Influenza Whole 09/18/2009, 11/27/2010   Influenza, High Dose Seasonal PF 09/21/2017, 11/04/2018   Influenza,inj,Quad PF,6+ Mos 09/13/2013, 01/23/2016, 08/15/2016   Moderna Sars-Covid-2 Vaccination 02/06/2020, 03/07/2020, 02/02/2023   Pneumococcal Conjugate (Pcv15) 02/04/2023   Pneumococcal Polysaccharide-23 09/18/2009,  01/23/2016    TDAP status: Due, Education has been provided regarding the importance of this vaccine. Advised may receive this vaccine at local pharmacy or Health Dept. Aware to provide a copy of the vaccination record if obtained from local pharmacy or Health Dept. Verbalized acceptance and understanding.  Flu Vaccine status: Up to date  Pneumococcal vaccine status: Up to date  Covid-19 vaccine status: Completed vaccines  Qualifies for Shingles Vaccine? No   Zostavax completed No   Shingrix Completed?: No.    Education has been provided regarding the importance of this vaccine. Patient has been advised to call insurance company to determine out of pocket expense if they have not yet received this vaccine. Advised may also receive vaccine at local pharmacy or Health Dept. Verbalized acceptance and understanding.  Screening Tests Health Maintenance  Topic Date Due   Medicare Annual Wellness (AWV)  02/08/2023   Zoster Vaccines- Shingrix (1 of 2) 06/15/2023 (Originally 01/18/1960)   INFLUENZA VACCINE  07/02/2023   HPV VACCINES  Aged Out   DTaP/Tdap/Td  Discontinued   Pneumonia Vaccine 14+ Years old  Discontinued   COVID-19 Vaccine  Discontinued    Health Maintenance  Health Maintenance Due  Topic Date Due   Medicare Annual Wellness (AWV)  02/08/2023    Colorectal cancer screening: No longer required.   Lung Cancer Screening: (Low Dose CT Chest recommended if Age 49-80 years, 30 pack-year currently smoking OR have quit w/in 15years.) does not qualify.   Lung Cancer Screening Referral: n/a  Additional Screening:  Hepatitis C Screening: does not qualify; Completed   Vision Screening: Recommended annual ophthalmology exams for early detection of glaucoma and other disorders of the eye. Is the patient up to date with their annual eye exam?  No  Who is the provider or what is the name of the office in which the patient attends annual eye exams?  If pt is not established with a  provider, would they like to be referred to a provider to establish care? No .   Dental Screening: Recommended annual dental exams for proper oral hygiene  Community Resource Referral / Chronic Care Management: CRR required this visit?  No   CCM required this visit?  No      Plan:     I have personally reviewed and noted the following in the patient's chart:   Medical and social history Use of alcohol, tobacco or illicit drugs  Current medications and supplements including opioid prescriptions. Patient is not currently taking opioid prescriptions. Functional ability and status Nutritional status Physical activity Advanced directives List of other physicians Hospitalizations, surgeries, and ER visits in previous 12 months Vitals Screenings to include cognitive, depression, and falls Referrals and appointments  In addition, I have reviewed and discussed with patient certain preventive protocols, quality metrics, and best practice recommendations. A written personalized care plan for preventive services as well as general preventive health recommendations  were provided to patient.     Sharee Holster, NP   03/24/2023   Nurse Notes: this exam was performed by myself at this facility.

## 2023-03-24 NOTE — Progress Notes (Signed)
Location:  [facility] Room Number: 107-D Place of Service:  SNF (31) Provider: Synthia Innocent, NP  CODE STATUS: FULL CODE  Allergies  Allergen Reactions   Benzyl Alcohol Itching   Amlodipine Swelling    Swelling in feet   Aspirin Other (See Comments)    Upset stomach.   Other Hypertension    Hazelnuts   Penicillins Hives    Has patient had a PCN reaction causing immediate rash, facial/tongue/throat swelling, SOB or lightheadedness with hypotension: Yes Has patient had a PCN reaction causing severe rash involving mucus membranes or skin necrosis: No Has patient had a PCN reaction that required hospitalization No Has patient had a PCN reaction occurring within the last 10 years: No If all of the above answers are "NO", then may proceed with Cephalosporin use.    Sulfa Antibiotics Hives   Tylenol [Acetaminophen] Other (See Comments)    Pt reports he has had liver issues and was told not to take tylenol     Chief Complaint  Patient presents with   Medicare Wellness    Annual wellness visit.     HPI:    Past Medical History:  Diagnosis Date   Arthritis    Chronic rhinitis    Complication of anesthesia    patient usually hs to be intubated with anesthesia   COPD (chronic obstructive pulmonary disease)    PFT 06-26-09 FEV1  1.75( 71%), FVC 3.65( 101%), FEV1% 48, TLC 5.53(104%), DLCO 55%, no BD   Dyspnea    Hypertension    Nasal septal deviation    Nocturia    On home O2    2L N/C   OSA (obstructive sleep apnea) 04/21/2011   Partial small bowel obstruction 10/18/2021   Skin cancer     Past Surgical History:  Procedure Laterality Date   APPENDECTOMY     BACK SURGERY     CATARACT EXTRACTION W/PHACO  01/06/2013   Procedure: CATARACT EXTRACTION PHACO AND INTRAOCULAR LENS PLACEMENT (IOC);  Surgeon: Gemma Payor, MD;  Location: [facility];  Service: Ophthalmology;  Laterality: Right;  CDE: 22.17   CATARACT EXTRACTION W/PHACO Left 06/20/2022   Procedure:  CATARACT EXTRACTION PHACO AND INTRAOCULAR LENS PLACEMENT (IOC);  Surgeon: Fabio Pierce, MD;  Location: [facility];  Service: Ophthalmology;  Laterality: Left;  CDE 20.85   FEMORAL HERNIA REPAIR Right 11/19/2022   Procedure: HERNIA REPAIR FEMORAL WITH MESH;  Surgeon: Lucretia Roers, MD;  Location: [facility];  Service: General;  Laterality: Right;   INGUINAL HERNIA REPAIR Left 12/09/2021   Procedure: HERNIA REPAIR INGUINAL ADULT WITH MESH;  Surgeon: Lucretia Roers, MD;  Location: [facility];  Service: General;  Laterality: Left;   INGUINAL HERNIA REPAIR Right 03/19/2022   Procedure: HERNIA REPAIR INGUINAL ADULT WITH MESH;  Surgeon: Lucretia Roers, MD;  Location: [facility];  Service: General;  Laterality: Right;   ORIF PATELLA Left 01/22/2023   Procedure: OPEN REDUCTION INTERNAL FIXATION (ORIF) PATELLA;  Surgeon: [name], MD;  Location: [facility];  Service: Orthopedics;  Laterality: Left;    Social History   Socioeconomic History   Marital status: Divorced    Spouse name: Not on file   Number of children: 3   Years of education: Not on file   Highest education level: Not on file  Occupational History   Occupation: Armed forces technical officer: RETIRED  Tobacco Use   Smoking status: Former    Packs/day: 1.50    Years: 54.00  Additional pack years: 0.00    Total pack years: 81.00    Types: Cigarettes    Quit date: 12/01/2001    Years since quitting: 21.3   Smokeless tobacco: Never  Vaping Use   Vaping Use: Never used  Substance and Sexual Activity   Alcohol use: No   Drug use: No   Sexual activity: Not on file  Other Topics Concern   Not on file  Social History Narrative   Lives with sister.    Social Determinants of Health   Financial Resource Strain: Low Risk  (02/07/2022)   Overall Financial Resource Strain (CARDIA)    Difficulty of Paying Living Expenses: Not hard at all  Food Insecurity: No Food Insecurity (01/20/2023)   Hunger Vital Sign    Worried About Running Out of  Food in the Last Year: Never true    Ran Out of Food in the Last Year: Never true  Transportation Needs: No Transportation Needs (01/20/2023)   PRAPARE - Administrator, Civil Service (Medical): No    Lack of Transportation (Non-Medical): No  Physical Activity: Insufficiently Active (02/07/2022)   Exercise Vital Sign    Days of Exercise per Week: 3 days    Minutes of Exercise per Session: 20 min  Stress: No Stress Concern Present (02/07/2022)   [employer] of Occupational Health - Occupational Stress Questionnaire    Feeling of Stress : Not at all  Social Connections: Socially Isolated (02/07/2022)   Social Connection and Isolation Panel [NHANES]    Frequency of Communication with Friends and Family: More than three times a week    Frequency of Social Gatherings with Friends and Family: More than three times a week    Attends Religious Services: Never    Database administrator or Organizations: No    Attends Banker Meetings: Never    Marital Status: Divorced  Catering manager Violence: Not At Risk (01/20/2023)   Humiliation, Afraid, Rape, and Kick questionnaire    Fear of Current or Ex-Partner: No    Emotionally Abused: No    Physically Abused: No    Sexually Abused: No   Family History  Problem Relation Age of Onset   Lung cancer Mother       VITAL SIGNS BP (!) 116/52   Pulse 60   Temp 98 F (36.7 C)   Resp 20   Ht  (1.651 m)   Wt 127 lb 12.8 oz (58 kg)   SpO2 95%   BMI 21.27 kg/m   Outpatient Encounter Medications as of 03/24/2023  Medication Sig   albuterol (VENTOLIN HFA) 108 (90 Base) MCG/ACT inhaler Inhale 2 puffs into the lungs every 6 (six) hours as needed for wheezing or shortness of breath.   Albuterol Sulfate 2.5 MG/0.5ML NEBU Inhale 1 each into the lungs 3 (three) times daily.   Amino Acids-Protein Hydrolys (FEEDING SUPPLEMENT, PRO-STAT SUGAR FREE 64,) LIQD Take 30 mLs by mouth 3 (three) times daily with meals.   ascorbic  acid (VITAMIN C) 500 MG tablet Take 1,000 mg by mouth daily.   Budeson-Glycopyrrol-Formoterol (BREZTRI AEROSPHERE) 160-9-4.8 MCG/ACT AERO Inhale 2 puffs into the lungs in the morning and at bedtime.   cholecalciferol (VITAMIN D) 25 MCG (1000 UNIT) tablet Take 3,000 Units by mouth daily.   DOXYCYCLINE HYCLATE PO Take 100 mg by mouth in the morning and at bedtime.   feeding supplement (ENSURE ENLIVE / ENSURE PLUS) LIQD Take 237 mLs by mouth 3 (three) times daily  between meals.   ferrous sulfate 325 (65 FE) MG EC tablet Take 325 mg by mouth every Monday, Wednesday, and Friday.   finasteride (PROSCAR) 5 MG tablet Take 1 tablet (5 mg total) by mouth daily.   guaiFENesin-dextromethorphan (ROBITUSSIN DM) 100-10 MG/5ML syrup Take 10 mLs by mouth every 4 (four) hours as needed for cough.   ipratropium-albuterol (DUONEB) 0.5-2.5 (3) MG/3ML SOLN Take 3 mLs by nebulization every 6 (six) hours as needed (wheezing, shortness of breath).   ipratropium-albuterol (DUONEB) 0.5-2.5 (3) MG/3ML SOLN Take 3 mLs by nebulization every 6 (six) hours.   losartan (COZAAR) 25 MG tablet Take 12.5 mg by mouth daily.   methocarbamol (ROBAXIN) 500 MG tablet Take 500 mg by mouth 3 (three) times daily.   Multiple Vitamin (MULTIVITAMIN) tablet Take 1 tablet by mouth daily. Men's health   Omega-3 Fatty Acids (FISH OIL CONCENTRATE PO) Take 1,000 mg by mouth daily.   OXYGEN Inhale 2 L into the lungs as directed. Every Shift; Day, Evening, Night   pantoprazole (PROTONIX) 40 MG tablet Take 1 tablet (40 mg total) by mouth daily.   polyethylene glycol (MIRALAX / GLYCOLAX) 17 g packet Take 17 g by mouth daily as needed.   pyridoxine (B-6) 100 MG tablet Take 100 mg by mouth daily.   tamsulosin (FLOMAX) 0.4 MG CAPS capsule Take 2 capsules (0.8 mg total) by mouth daily.   UNABLE TO FIND Diet: Dysphagia 3/Thin Liquids, Consistent carbohydrate   ZINC SULFATE PO Take 50 mg by mouth daily.   [DISCONTINUED] Nebulizer MISC 1 each by Does not  apply route daily as needed. PLEASE PROVIDE NEBULIZER KIT TO INCLUDE TUBING/MASK/MOUTHPIECE   [DISCONTINUED] zinc gluconate 50 MG tablet Take 50 mg by mouth daily.   No facility-administered encounter medications on file as of 03/24/2023.     SIGNIFICANT DIAGNOSTIC EXAMS       ASSESSMENT/ PLAN:     Synthia Innocent NP [facility]  Contact [phone] Monday through Friday 8am- 5pm  After hours call [phone]

## 2023-03-24 NOTE — Patient Instructions (Signed)
   [name] , Thank you for taking time to come for your Medicare Wellness Visit. I appreciate your ongoing commitment to your health goals. Please review the following plan we discussed and let me know if I can assist you in the future.   These are the goals we discussed:  Goals      Absence of Fall and Fall-Related Injury     Evidence-based guidance:  Assess fall risk using a validated tool when available. Consider balance and gait impairment, muscle weakness, diminished vision or hearing, environmental hazards, presence of urinary or bowel urgency and/or incontinence.  Communicate fall injury risk to interprofessional healthcare team.  Develop a fall prevention plan with the patient and family.  Promote use of personal vision and auditory aids.  Promote reorientation, appropriate sensory stimulation, and routines to decrease risk of fall when changes in mental status are present.  Assess assistance level required for safe and effective self-care; consider referral for home care.  Encourage physical activity, such as performance of self-care at highest level of ability, strength and balance exercise program, and provision of appropriate assistive devices; refer to rehabilitation therapy.  Refer to community-based fall prevention program where available.  If fall occurs, determine the cause and revise fall injury prevention plan.  Regularly review medication contribution to fall risk; consider risk related to polypharmacy and age.  Refer to pharmacist for consultation when concerns about medications are revealed.  Balance adequate pain management with potential for oversedation.  Provide guidance related to environmental modifications.  Consider supplementation with Vitamin D.   Notes:      DIET - INCREASE WATER INTAKE     General - Client will not be readmitted within 30 days (C-SNP)        This is a list of the screening recommended for you and due dates:  Health Maintenance  Topic  Date Due   Zoster (Shingles) Vaccine (1 of 2) 06/15/2023*   Flu Shot  07/02/2023   Medicare Annual Wellness Visit  03/23/2024   HPV Vaccine  Aged Out   DTaP/Tdap/Td vaccine  Discontinued   Pneumonia Vaccine  Discontinued   COVID-19 Vaccine  Discontinued  *Topic was postponed. The date shown is not the original due date.

## 2023-04-14 ENCOUNTER — Encounter: Payer: Self-pay | Admitting: Adult Health

## 2023-04-14 ENCOUNTER — Other Ambulatory Visit (INDEPENDENT_AMBULATORY_CARE_PROVIDER_SITE_OTHER): Payer: 59

## 2023-04-14 ENCOUNTER — Ambulatory Visit (INDEPENDENT_AMBULATORY_CARE_PROVIDER_SITE_OTHER): Payer: 59 | Admitting: Orthopedic Surgery

## 2023-04-14 ENCOUNTER — Encounter: Payer: Self-pay | Admitting: Orthopedic Surgery

## 2023-04-14 ENCOUNTER — Non-Acute Institutional Stay (SKILLED_NURSING_FACILITY): Payer: 59 | Admitting: Adult Health

## 2023-04-14 DIAGNOSIS — E43 Unspecified severe protein-calorie malnutrition: Secondary | ICD-10-CM | POA: Diagnosis not present

## 2023-04-14 DIAGNOSIS — N138 Other obstructive and reflux uropathy: Secondary | ICD-10-CM | POA: Diagnosis not present

## 2023-04-14 DIAGNOSIS — K219 Gastro-esophageal reflux disease without esophagitis: Secondary | ICD-10-CM

## 2023-04-14 DIAGNOSIS — N401 Enlarged prostate with lower urinary tract symptoms: Secondary | ICD-10-CM | POA: Diagnosis not present

## 2023-04-14 DIAGNOSIS — S82042D Displaced comminuted fracture of left patella, subsequent encounter for closed fracture with routine healing: Secondary | ICD-10-CM

## 2023-04-14 NOTE — Progress Notes (Signed)
Location:  [facility] Room Number: 107D Place of Service:  SNF (31)   CODE STATUS: Full Code   Allergies  Allergen Reactions   Benzyl Alcohol Itching   Amlodipine Swelling    Swelling in feet   Aspirin Other (See Comments)    Upset stomach.   Other Hypertension    Hazelnuts   Penicillins Hives    Has patient had a PCN reaction causing immediate rash, facial/tongue/throat swelling, SOB or lightheadedness with hypotension: Yes Has patient had a PCN reaction causing severe rash involving mucus membranes or skin necrosis: No Has patient had a PCN reaction that required hospitalization No Has patient had a PCN reaction occurring within the last 10 years: No If all of the above answers are "NO", then may proceed with Cephalosporin use.    Sulfa Antibiotics Hives   Tylenol [Acetaminophen] Other (See Comments)    Pt reports he has had liver issues and was told not to take tylenol     Chief Complaint  Patient presents with   Medical Management of Chronic Issues                   Gastroesophageal reflux disease without esophagitis: BPH with obstruction/urinary symptoms: Protein calorie malnutrition severe;      HPI:  He is a 82 year old long term resident of this facility being seen for the management of his chronic illnesses:  Gastroesophageal reflux disease without esophagitis: BPH with obstruction/urinary symptoms: Protein calorie malnutrition severe. He denies any worsening cough or shortness of breath; no uncontrolled pain.   Past Medical History:  Diagnosis Date   Arthritis    Chronic rhinitis    Complication of anesthesia    patient usually hs to be intubated with anesthesia   COPD (chronic obstructive pulmonary disease) (HCC)    PFT 06-26-09 FEV1  1.75( 71%), FVC 3.65( 101%), FEV1% 48, TLC 5.53(104%), DLCO 55%, no BD   Dyspnea    Hypertension    Nasal septal deviation    Nocturia    On home O2    2L N/C   OSA (obstructive sleep apnea)  04/21/2011   Partial small bowel obstruction (HCC) 10/18/2021   Skin cancer     Past Surgical History:  Procedure Laterality Date   APPENDECTOMY     BACK SURGERY     CATARACT EXTRACTION W/PHACO  01/06/2013   Procedure: CATARACT EXTRACTION PHACO AND INTRAOCULAR LENS PLACEMENT (IOC);  Surgeon: Gemma Payor, MD;  Location: [facility];  Service: Ophthalmology;  Laterality: Right;  CDE: 22.17   CATARACT EXTRACTION W/PHACO Left 06/20/2022   Procedure: CATARACT EXTRACTION PHACO AND INTRAOCULAR LENS PLACEMENT (IOC);  Surgeon: Fabio Pierce, MD;  Location: [facility];  Service: Ophthalmology;  Laterality: Left;  CDE 20.85   FEMORAL HERNIA REPAIR Right 11/19/2022   Procedure: HERNIA REPAIR FEMORAL WITH MESH;  Surgeon: Lucretia Roers, MD;  Location: [facility];  Service: General;  Laterality: Right;   INGUINAL HERNIA REPAIR Left 12/09/2021   Procedure: HERNIA REPAIR INGUINAL ADULT WITH MESH;  Surgeon: Lucretia Roers, MD;  Location: [facility];  Service: General;  Laterality: Left;   INGUINAL HERNIA REPAIR Right 03/19/2022   Procedure: HERNIA REPAIR INGUINAL ADULT WITH MESH;  Surgeon: Lucretia Roers, MD;  Location: [facility];  Service: General;  Laterality: Right;   ORIF PATELLA Left 01/22/2023   Procedure: OPEN REDUCTION INTERNAL FIXATION (ORIF) PATELLA;  Surgeon: [name], MD;  Location: [facility];  Service: Orthopedics;  Laterality: Left;  Social History   Socioeconomic History   Marital status: Divorced    Spouse name: Not on file   Number of children: 3   Years of education: Not on file   Highest education level: Not on file  Occupational History   Occupation: Armed forces technical officer: RETIRED  Tobacco Use   Smoking status: Former    Packs/day: 1.50    Years: 54.00    Additional pack years: 0.00    Total pack years: 81.00    Types: Cigarettes    Quit date: 12/01/2001    Years since quitting: 21.3   Smokeless tobacco: Never  Vaping Use   Vaping Use: Never used  Substance and Sexual Activity    Alcohol use: No   Drug use: No   Sexual activity: Not on file  Other Topics Concern   Not on file  Social History Narrative   Lives with sister.    Social Determinants of Health   Financial Resource Strain: Low Risk  (02/07/2022)   Overall Financial Resource Strain (CARDIA)    Difficulty of Paying Living Expenses: Not hard at all  Food Insecurity: No Food Insecurity (01/20/2023)   Hunger Vital Sign    Worried About Running Out of Food in the Last Year: Never true    Ran Out of Food in the Last Year: Never true  Transportation Needs: No Transportation Needs (01/20/2023)   PRAPARE - Administrator, Civil Service (Medical): No    Lack of Transportation (Non-Medical): No  Physical Activity: Insufficiently Active (02/07/2022)   Exercise Vital Sign    Days of Exercise per Week: 3 days    Minutes of Exercise per Session: 20 min  Stress: No Stress Concern Present (02/07/2022)   [employer] of Occupational Health - Occupational Stress Questionnaire    Feeling of Stress : Not at all  Social Connections: Socially Isolated (02/07/2022)   Social Connection and Isolation Panel [NHANES]    Frequency of Communication with Friends and Family: More than three times a week    Frequency of Social Gatherings with Friends and Family: More than three times a week    Attends Religious Services: Never    Database administrator or Organizations: No    Attends Banker Meetings: Never    Marital Status: Divorced  Catering manager Violence: Not At Risk (01/20/2023)   Humiliation, Afraid, Rape, and Kick questionnaire    Fear of Current or Ex-Partner: No    Emotionally Abused: No    Physically Abused: No    Sexually Abused: No   Family History  Problem Relation Age of Onset   Lung cancer Mother       VITAL SIGNS BP 108/62   Pulse 84   Temp 97.9 F (36.6 C) (Temporal)   Resp 20   Ht 5\' 5"  (1.651 m)   Wt 132 lb 3.2 oz (60 kg)   SpO2 94%   BMI 22.00 kg/m    Outpatient Encounter Medications as of 04/14/2023  Medication Sig   albuterol (VENTOLIN HFA) 108 (90 Base) MCG/ACT inhaler Inhale 2 puffs into the lungs every 6 (six) hours as needed for wheezing or shortness of breath.   Albuterol Sulfate 2.5 MG/0.5ML NEBU Inhale 1 each into the lungs 3 (three) times daily.   Amino Acids-Protein Hydrolys (FEEDING SUPPLEMENT, PRO-STAT SUGAR FREE 64,) LIQD Take 30 mLs by mouth 3 (three) times daily with meals.   ascorbic acid (VITAMIN C) 500 MG tablet Take 1,000  mg by mouth daily.   Budeson-Glycopyrrol-Formoterol (BREZTRI AEROSPHERE) 160-9-4.8 MCG/ACT AERO Inhale 2 puffs into the lungs in the morning and at bedtime.   cholecalciferol (VITAMIN D) 25 MCG (1000 UNIT) tablet Take 3,000 Units by mouth daily.   feeding supplement (ENSURE ENLIVE / ENSURE PLUS) LIQD Take 237 mLs by mouth 3 (three) times daily between meals.   ferrous sulfate 325 (65 FE) MG EC tablet Take 325 mg by mouth every Monday, Wednesday, and Friday.   finasteride (PROSCAR) 5 MG tablet Take 1 tablet (5 mg total) by mouth daily.   guaiFENesin-dextromethorphan (ROBITUSSIN DM) 100-10 MG/5ML syrup Take 10 mLs by mouth every 4 (four) hours as needed for cough.   ipratropium-albuterol (DUONEB) 0.5-2.5 (3) MG/3ML SOLN Take 3 mLs by nebulization every 6 (six) hours as needed (wheezing, shortness of breath).   losartan (COZAAR) 25 MG tablet Take 12.5 mg by mouth daily.   methocarbamol (ROBAXIN) 500 MG tablet Take 500 mg by mouth 3 (three) times daily.   Multiple Vitamin (MULTIVITAMIN) tablet Take 1 tablet by mouth daily. Men's health   Omega-3 Fatty Acids (FISH OIL CONCENTRATE PO) Take 1,000 mg by mouth daily.   OXYGEN Inhale 2 L into the lungs as directed. Every Shift; Day, Evening, Night   pantoprazole (PROTONIX) 40 MG tablet Take 1 tablet (40 mg total) by mouth daily.   polyethylene glycol (MIRALAX / GLYCOLAX) 17 g packet Take 17 g by mouth daily as needed.   pyridoxine (B-6) 100 MG tablet Take 100 mg  by mouth daily.   tamsulosin (FLOMAX) 0.4 MG CAPS capsule Take 2 capsules (0.8 mg total) by mouth daily.   UNABLE TO FIND Diet: Dysphagia 3/Thin Liquids, Consistent carbohydrate   ZINC SULFATE PO Take 50 mg by mouth daily.   DOXYCYCLINE HYCLATE PO Take 100 mg by mouth in the morning and at bedtime. (Patient not taking: Reported on 04/14/2023)   ipratropium-albuterol (DUONEB) 0.5-2.5 (3) MG/3ML SOLN Take 3 mLs by nebulization every 6 (six) hours. (Patient not taking: Reported on 04/14/2023)   No facility-administered encounter medications on file as of 04/14/2023.     SIGNIFICANT DIAGNOSTIC EXAMS  LABS REVIEWED; PREVIOUS   01-20-23; wbc 7.9 hgb 15.0; hct 45.6; mcv 89.2 plt 246; glucose 118 bun 31; creat 0.96; k+ 4.2; na++ 140; ca 10.4; gfr >60; protein 7.0; albumin 3.9 01-23-23; wbc 13.0; hgb 11.3; hct 34.0; mcv 90.7 plt 166;glucose 149; bun 17; creat 0.84; k+ 3.9; na++ 134; ca 8.6; gfr >60; phos 2.4 albumin 2.7; mag 1.9; vitamin B12: 837; folate 15.5; iron 19; tibc 320; ferritin 72.  01-25-23: hgb A1c 6.8 01-27-23: wbc 11.0; hgb 11.2; hct 34.4; mcv 88.9 plt 215; glucose 169; bun 24; creat 0.72; k+ 3.4; na++ 138; ca 8.9; gfr >60; protein 5.6; albumin 2.5; mag 1.9 01-29-23; wbc 11.7; hgb 11.5; hct 35.8; mcv 90.4 plt 235; glucose 117; bun 32; creat 0.88; k+ 3.5; na++ 138; ca 8.6; gfr >60; protein 5.7 albumin 2.6 mag 2.1 03-12-23 wbc 7.8; hgb 12.3; hct 38.6; mcv 91.3 plt 289; glucose 114; bun 24; creat 0.89; k+ 4.0; na++ 135; ca 9.1 gfr >60; protein 6.6 albumin 3.1 tsh 3.506; vitamin B 12: 1393 rpr: nr   NO NEW LABS.    Review of Systems  Constitutional:  Negative for malaise/fatigue.  Respiratory:  Negative for cough and shortness of breath.   Cardiovascular:  Negative for chest pain, palpitations and leg swelling.  Gastrointestinal:  Negative for abdominal pain, constipation and heartburn.  Musculoskeletal:  Negative for back  pain, joint pain and myalgias.  Skin: Negative.   Neurological:   Negative for dizziness.  Psychiatric/Behavioral:  The patient is not nervous/anxious.    Physical Exam Constitutional:      General: He is not in acute distress.    Appearance: He is well-developed. He is not diaphoretic.  Neck:     Thyroid: No thyromegaly.  Cardiovascular:     Rate and Rhythm: Normal rate and regular rhythm.     Pulses: Normal pulses.     Heart sounds: Normal heart sounds.  Pulmonary:     Effort: Pulmonary effort is normal. No respiratory distress.     Breath sounds: Normal breath sounds.  Abdominal:     General: Bowel sounds are normal. There is no distension.     Palpations: Abdomen is soft.     Tenderness: There is no abdominal tenderness.  Musculoskeletal:        General: Normal range of motion.     Cervical back: Neck supple.  Lymphadenopathy:     Cervical: No cervical adenopathy.  Skin:    General: Skin is warm and dry.  Neurological:     Mental Status: He is alert. Mental status is at baseline.  Psychiatric:        Mood and Affect: Mood normal.       ASSESSMENT/ PLAN:  TODAY  Gastroesophageal reflux disease without esophagitis: will continue protonix 40 mg daily   2. BPH with obstruction/urinary symptoms: will continue proscar 5 mg daily and flomax 0.8 mg daily   3. Protein calorie malnutrition severe; protein 5.6 albumin 2.5 will continue ensure three times daily and prostat three times daily    PREVIOUS   4. Chronic constipation: will continue miralax daily   5. Acute blood loss anemia/iron deficiency anemia: hgb 11.2 iron 19  iron three days weekly; is status post iron infusion at hospital.   6. Hyperglycemia steroid induced: hgb A1c is 6.8; will monitor    7. Vascular dementia without behavioral disturbance: weight is 132 pounds; will monitor   8. Closed displaced comminuted fracture of left patella sequela: will continue to monitor his status   9. Aortic atherosclerosis (ct 05-23-22)   10. Essential hypertension: b/p 116/68 is on  asa 81 mg daily   11. Pulmonary emphysema unspecified emphysema/chronic respiratory failure with hypoxia : has 81 pack year history   will continue breztri aerosphere 160-9-4.8 mcg 2 puffs twice daily albuterol neb or 2 puffs every 6 hours as needed.     Synthia Innocent NP [facility]  call [phone]

## 2023-04-14 NOTE — Progress Notes (Signed)
Orthopaedic Postop Note  Assessment: Michael Evans is a 82 y.o. male s/p operative fixation of left patella fracture  DOS: 01/22/2023  Plan: Michael Evans is improving.  He has pretty good range of motion.  He is lacking approximately 10-15 degrees of full extension.  Easily tolerates flexion beyond 90 degrees.  He does have some prominence of hardware, especially laterally.  We will try knee brace to see if this provides some additional protection.  We can consider removing the hardware at some point in the future.  He should focus on achieving full extension.  He has atrophy of the quadriceps, and should focus on strengthening.  Walking will be excellent physical therapy.  Follow-up: No follow-ups on file. XR at next visit: Left knee  Subjective:  Chief Complaint  Patient presents with   Routine Post Op    L Patella DOS 01/22/23    History of Present Illness: Michael Evans is a 82 y.o. male who presents following the above stated procedure.  Surgery was approximately 10 weeks ago.  He remains in the nursing facility.  He states he is feeling better.  He notes prominent hardware laterally over the knee.  He is asking for a "guard" for the knee.  He states he is walking using a walker.  He is here today in a wheelchair.  He has some irritation in his nose due to the nasal cannula.  Review of Systems: No fevers or chills No numbness or tingling No Chest Pain + shortness of breath   Objective: There were no vitals taken for this visit.  Physical Exam:  Elderly male.  Seated in wheelchair.  Nasal cannula and supplemental oxygen in place.  Alert and oriented.  Surgical incision is healed.  No surrounding erythema or drainage.  There is some prominence of the hardware, especially laterally.  No overlying skin changes.  No erythema in this area.  There is some mild tenderness to palpation laterally.  Approximately 10 degrees short of full extension.  He is able to flex the knee beyond 90  degrees. IMAGING: I personally ordered and reviewed the following images:  AP and lateral x-rays of the left knee were obtained in clinic today.  These are compared to prior x-rays.  The hardware remains intact.  K wires are not backing out.  Fracture remains in stable alignment.  There has been no interval displacement.  There is a mild step-off within the joint. No bony lesions.  Impression: Healed left patella fracture following operative fixation, without hardware failure.  Michael Barre, MD 04/14/2023 1:51 PM

## 2023-04-18 IMAGING — CT CT ABD-PELV W/ CM
2 of 5 series · 15 of 46 positions shown, 17 images · IV contrast (Omnipaque or Isovue)
Comparison: CT scan 10/18/2021

CLINICAL DATA: Right groin pain.

EXAM:
CT ABDOMEN AND PELVIS WITH CONTRAST
TECHNIQUE: Multidetector CT imaging of the abdomen and pelvis was performed
using the standard protocol following bolus administration of
intravenous contrast.

[Series 2: axial st · axial · 0.80mm/px · z∈[+1546,+1941]mm · 12 of 89 slices shown, 14 images]
[im 5/89  soft-tissue]
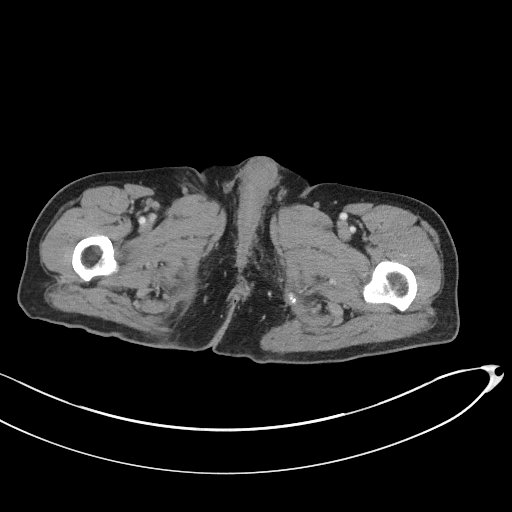
[im 5/89  bone]
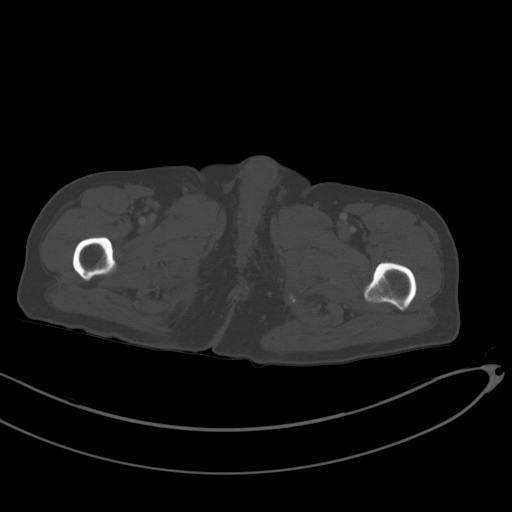
[im 14/89  soft-tissue]
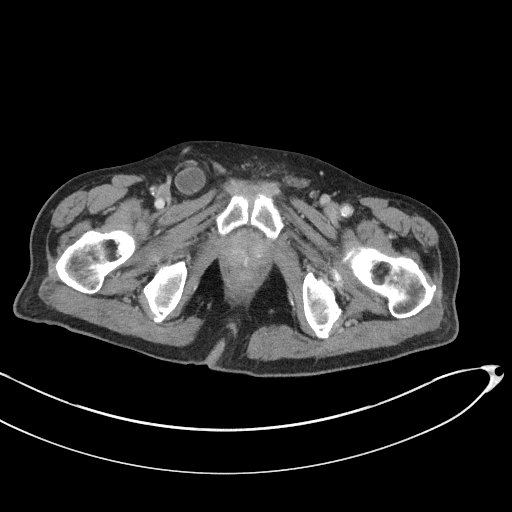
[im 19/89  soft-tissue]
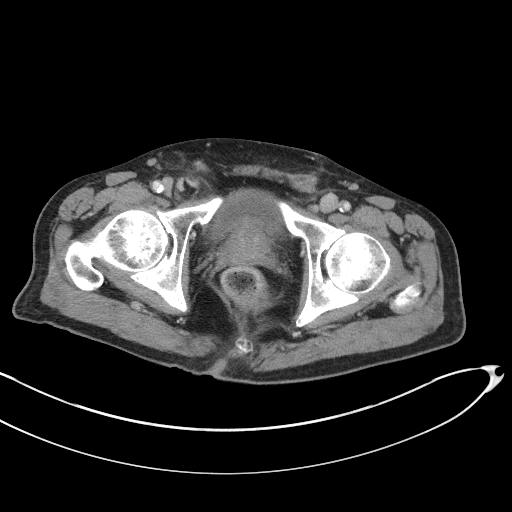
[im 28/89  soft-tissue]
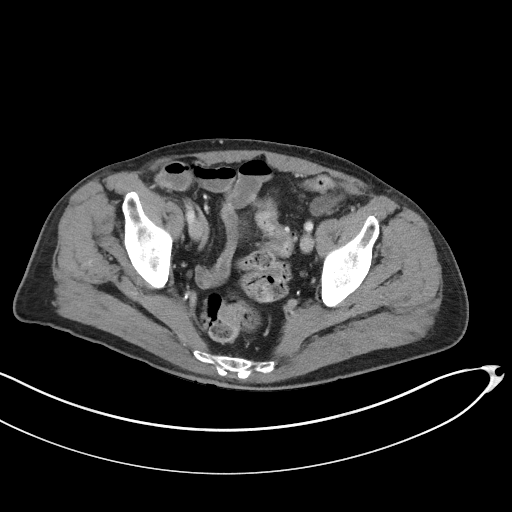
[im 33/89  soft-tissue]
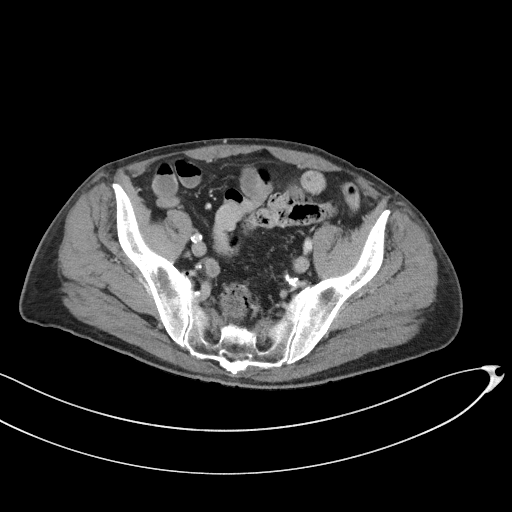
[im 42/89  soft-tissue]
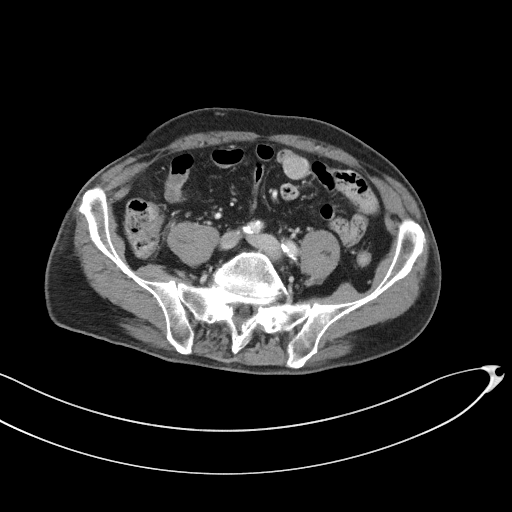
[im 47/89  soft-tissue]
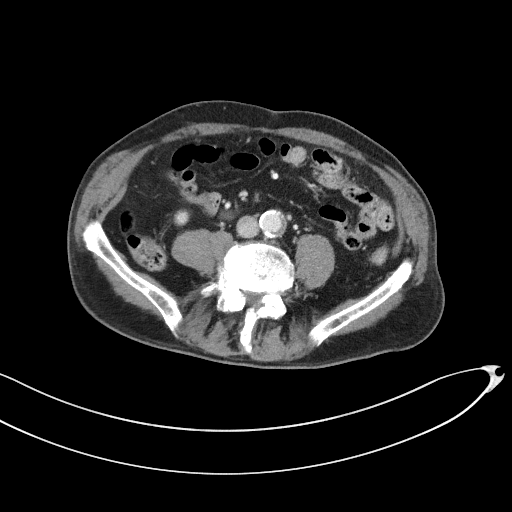
[im 56/89  soft-tissue]
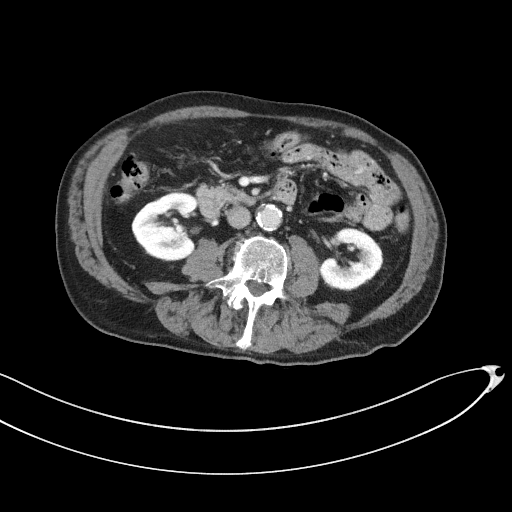
[im 61/89  soft-tissue]
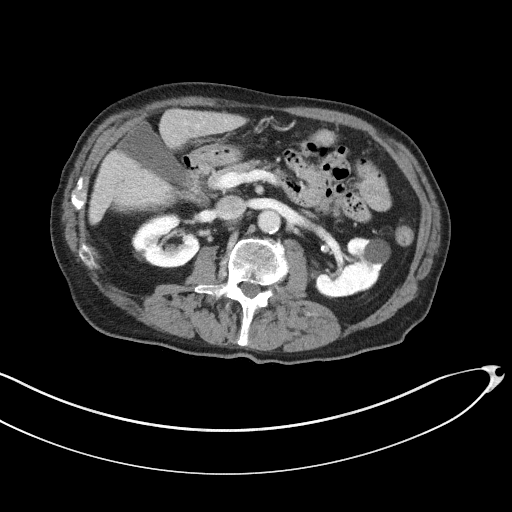
[im 61/89  bone]
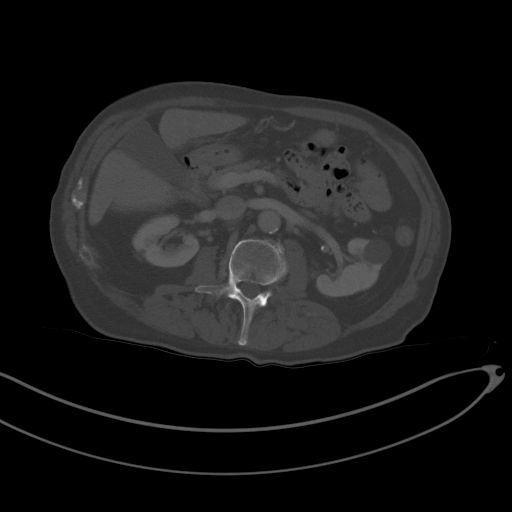
[im 70/89  soft-tissue]
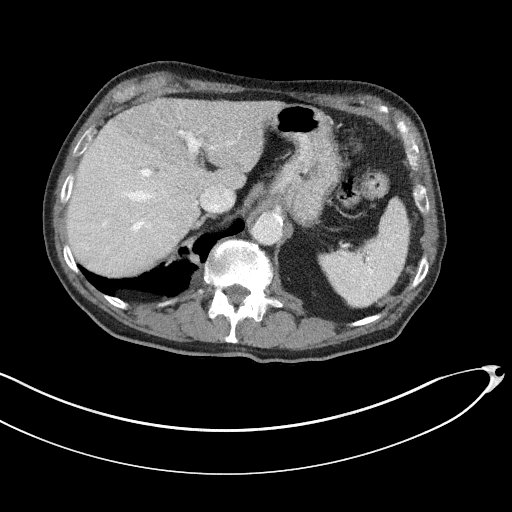
[im 75/89  soft-tissue]
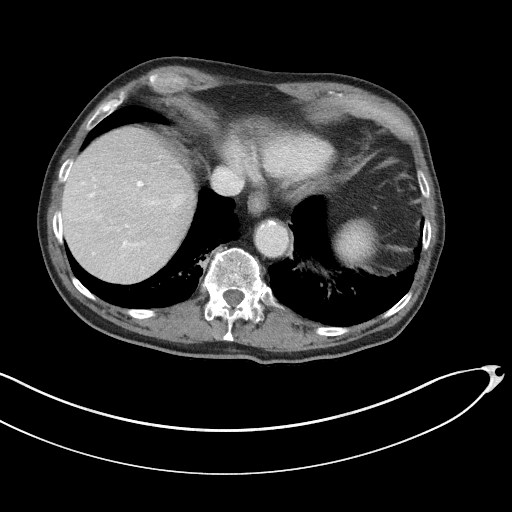
[im 84/89  soft-tissue]
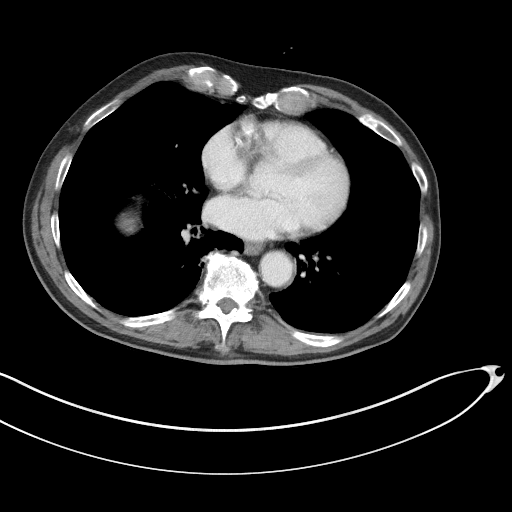

[Series 5: coronal st · coronal · 0.78mm/px · 3 of 98 slices shown]
[im 33/98  soft-tissue]
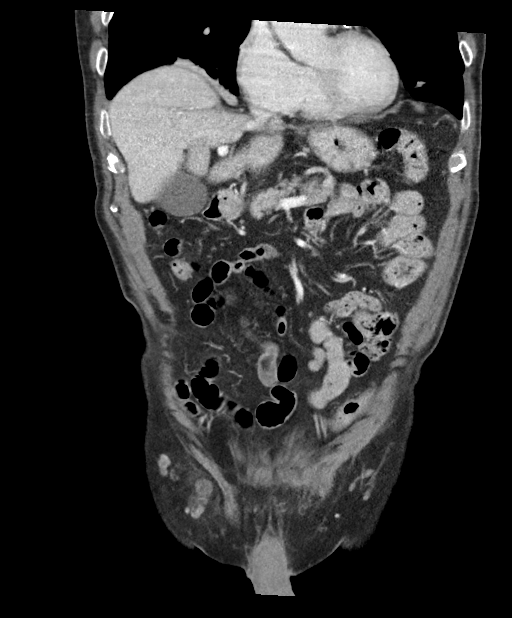
[im 44/98  soft-tissue]
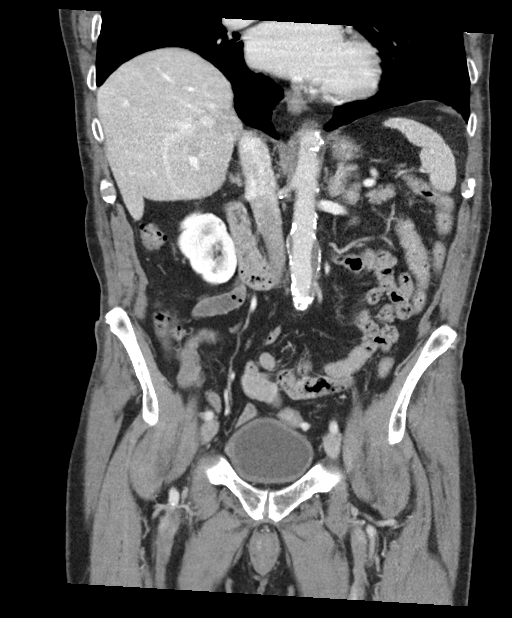
[im 54/98  soft-tissue]
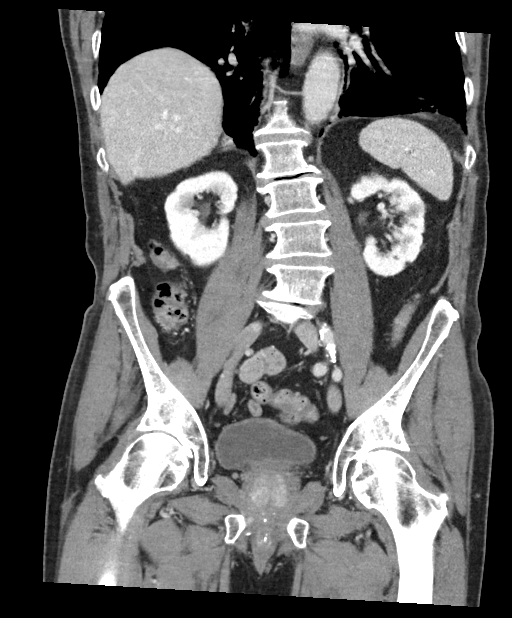

[15 of 46 positions shown; findings below may reference images not displayed]

RADIATION DOSE REDUCTION: This exam was performed according to the
departmental dose-optimization program which includes automated
exposure control, adjustment of the mA and/or kV according to
patient size and/or use of iterative reconstruction technique.

CONTRAST:  100mL OMNIPAQUE IOHEXOL 300 MG/ML  SOLN
FINDINGS: Lower chest: Stable emphysematous changes and pulmonary scarring.
The heart is normal in size. No pericardial effusion.

Hepatobiliary: No hepatic lesions or intrahepatic biliary
dilatation. The gallbladder is unremarkable. No common bile duct
dilatation.

Pancreas: No mass, inflammation or ductal dilatation.

Spleen: Normal size.  No focal lesions.

Adrenals/Urinary Tract: Adrenal glands and kidneys are unremarkable
and stable. Stable left renal cyst. The bladder is unremarkable.

Stomach/Bowel: The stomach, duodenum, small bowel and colon are
unremarkable. There is a right inguinal hernia containing a slightly
dilated fluid-filled loop of small bowel. Both limbs are somewhat
constricted in the hernia and there are slightly dilated loops of
small bowel proximally. No surrounding inflammatory changes or fluid
in the hernia sac. Very similar appearance on the prior CT scan. The
left inguinal hernia seen on the prior CT scan has been fixed
surgically.

Vascular/Lymphatic: Stable advanced atherosclerotic calcifications
involving the aorta and branch vessels but no focal aneurysm or
dissection. The major venous structures are patent. No mesenteric or
retroperitoneal mass or adenopathy.

Reproductive: Mildly enlarged prostate gland appears stable. The
seminal vesicles are unremarkable.

Other: No pelvic mass or adenopathy. No free pelvic fluid
collections. No inguinal mass or adenopathy. No abdominal wall
hernia or subcutaneous lesions.

Musculoskeletal: No significant bony findings.
IMPRESSION: 1. Right inguinal hernia containing a fluid-filled small bowel lobe
with early obstructive findings.
2. Status post left inguinal hernia repair.
3. Stable advanced atherosclerotic calcifications involving the
aorta and branch vessels.

Aortic Atherosclerosis ([id number]).

## 2023-04-20 ENCOUNTER — Encounter: Payer: Self-pay | Admitting: Adult Health

## 2023-04-20 NOTE — Progress Notes (Signed)
This encounter was created in error - please disregard.

## 2023-04-20 NOTE — Progress Notes (Signed)
Location:  [facility] Room Number: North/107/D Place of Service:  SNF (31)   CODE STATUS: Full Code  Allergies  Allergen Reactions   Benzyl Alcohol Itching   Amlodipine Swelling    Swelling in feet   Aspirin Other (See Comments)    Upset stomach.   Other Hypertension    Hazelnuts   Penicillins Hives    Has patient had a PCN reaction causing immediate rash, facial/tongue/throat swelling, SOB or lightheadedness with hypotension: Yes Has patient had a PCN reaction causing severe rash involving mucus membranes or skin necrosis: No Has patient had a PCN reaction that required hospitalization No Has patient had a PCN reaction occurring within the last 10 years: No If all of the above answers are "NO", then may proceed with Cephalosporin use.    Sulfa Antibiotics Hives   Tylenol [Acetaminophen] Other (See Comments)    Pt reports he has had liver issues and was told not to take tylenol     Chief Complaint  Patient presents with   Medicare Wellness    Medicare Annual Wellness    HPI:    Past Medical History:  Diagnosis Date   Arthritis    Chronic rhinitis    Complication of anesthesia    patient usually hs to be intubated with anesthesia   COPD (chronic obstructive pulmonary disease) (HCC)    PFT 06-26-09 FEV1  1.75( 71%), FVC 3.65( 101%), FEV1% 48, TLC 5.53(104%), DLCO 55%, no BD   Dyspnea    Hypertension    Nasal septal deviation    Nocturia    On home O2    2L N/C   OSA (obstructive sleep apnea) 04/21/2011   Partial small bowel obstruction (HCC) 10/18/2021   Skin cancer     Past Surgical History:  Procedure Laterality Date   APPENDECTOMY     BACK SURGERY     CATARACT EXTRACTION W/PHACO  01/06/2013   Procedure: CATARACT EXTRACTION PHACO AND INTRAOCULAR LENS PLACEMENT (IOC);  Surgeon: Gemma Payor, MD;  Location: [facility];  Service: Ophthalmology;  Laterality: Right;  CDE: 22.17   CATARACT EXTRACTION W/PHACO Left 06/20/2022   Procedure: CATARACT  EXTRACTION PHACO AND INTRAOCULAR LENS PLACEMENT (IOC);  Surgeon: Fabio Pierce, MD;  Location: [facility];  Service: Ophthalmology;  Laterality: Left;  CDE 20.85   FEMORAL HERNIA REPAIR Right 11/19/2022   Procedure: HERNIA REPAIR FEMORAL WITH MESH;  Surgeon: Lucretia Roers, MD;  Location: [facility];  Service: General;  Laterality: Right;   INGUINAL HERNIA REPAIR Left 12/09/2021   Procedure: HERNIA REPAIR INGUINAL ADULT WITH MESH;  Surgeon: Lucretia Roers, MD;  Location: [facility];  Service: General;  Laterality: Left;   INGUINAL HERNIA REPAIR Right 03/19/2022   Procedure: HERNIA REPAIR INGUINAL ADULT WITH MESH;  Surgeon: Lucretia Roers, MD;  Location: [facility];  Service: General;  Laterality: Right;   ORIF PATELLA Left 01/22/2023   Procedure: OPEN REDUCTION INTERNAL FIXATION (ORIF) PATELLA;  Surgeon: [name], MD;  Location: [facility];  Service: Orthopedics;  Laterality: Left;    Social History   Socioeconomic History   Marital status: Divorced    Spouse name: Not on file   Number of children: 3   Years of education: Not on file   Highest education level: Not on file  Occupational History   Occupation: Armed forces technical officer: RETIRED  Tobacco Use   Smoking status: Former    Packs/day: 1.50    Years: 54.00  Additional pack years: 0.00    Total pack years: 81.00    Types: Cigarettes    Quit date: 12/01/2001    Years since quitting: 21.3   Smokeless tobacco: Never  Vaping Use   Vaping Use: Never used  Substance and Sexual Activity   Alcohol use: No   Drug use: No   Sexual activity: Not on file  Other Topics Concern   Not on file  Social History Narrative   Lives with sister.    Social Determinants of Health   Financial Resource Strain: Low Risk  (02/07/2022)   Overall Financial Resource Strain (CARDIA)    Difficulty of Paying Living Expenses: Not hard at all  Food Insecurity: No Food Insecurity (01/20/2023)   Hunger Vital Sign    Worried About Running Out of Food in the  Last Year: Never true    Ran Out of Food in the Last Year: Never true  Transportation Needs: No Transportation Needs (01/20/2023)   PRAPARE - Administrator, Civil Service (Medical): No    Lack of Transportation (Non-Medical): No  Physical Activity: Insufficiently Active (02/07/2022)   Exercise Vital Sign    Days of Exercise per Week: 3 days    Minutes of Exercise per Session: 20 min  Stress: No Stress Concern Present (02/07/2022)   [employer] of Occupational Health - Occupational Stress Questionnaire    Feeling of Stress : Not at all  Social Connections: Socially Isolated (02/07/2022)   Social Connection and Isolation Panel [NHANES]    Frequency of Communication with Friends and Family: More than three times a week    Frequency of Social Gatherings with Friends and Family: More than three times a week    Attends Religious Services: Never    Database administrator or Organizations: No    Attends Banker Meetings: Never    Marital Status: Divorced  Catering manager Violence: Not At Risk (01/20/2023)   Humiliation, Afraid, Rape, and Kick questionnaire    Fear of Current or Ex-Partner: No    Emotionally Abused: No    Physically Abused: No    Sexually Abused: No   Family History  Problem Relation Age of Onset   Lung cancer Mother       VITAL SIGNS BP (!) 120/58   Pulse 88   Temp 98 F (36.7 C)   Resp 20   Ht 5\' 5"  (1.651 m)   Wt 132 lb 3.2 oz (60 kg)   SpO2 96%   BMI 22.00 kg/m   Outpatient Encounter Medications as of 04/20/2023  Medication Sig   albuterol (VENTOLIN HFA) 108 (90 Base) MCG/ACT inhaler Inhale 2 puffs into the lungs every 6 (six) hours as needed for wheezing or shortness of breath.   Albuterol Sulfate 2.5 MG/0.5ML NEBU Inhale 1 each into the lungs 3 (three) times daily.   Amino Acids-Protein Hydrolys (FEEDING SUPPLEMENT, PRO-STAT SUGAR FREE 64,) LIQD Take 30 mLs by mouth 3 (three) times daily with meals.   ascorbic acid (VITAMIN  C) 500 MG tablet Take 1,000 mg by mouth daily.   Budeson-Glycopyrrol-Formoterol (BREZTRI AEROSPHERE) 160-9-4.8 MCG/ACT AERO Inhale 2 puffs into the lungs in the morning and at bedtime.   cholecalciferol (VITAMIN D) 25 MCG (1000 UNIT) tablet Take 3,000 Units by mouth daily.   feeding supplement (ENSURE ENLIVE / ENSURE PLUS) LIQD Take 237 mLs by mouth 3 (three) times daily between meals.   ferrous sulfate 325 (65 FE) MG EC tablet Take 325 mg by  mouth every Monday, Wednesday, and Friday.   finasteride (PROSCAR) 5 MG tablet Take 1 tablet (5 mg total) by mouth daily.   guaiFENesin-dextromethorphan (ROBITUSSIN DM) 100-10 MG/5ML syrup Take 10 mLs by mouth every 4 (four) hours as needed for cough.   ipratropium-albuterol (DUONEB) 0.5-2.5 (3) MG/3ML SOLN Take 3 mLs by nebulization every 6 (six) hours as needed (wheezing, shortness of breath).   ipratropium-albuterol (DUONEB) 0.5-2.5 (3) MG/3ML SOLN Take 3 mLs by nebulization every 6 (six) hours.   losartan (COZAAR) 25 MG tablet Take 12.5 mg by mouth daily.   methocarbamol (ROBAXIN) 500 MG tablet Take 500 mg by mouth 3 (three) times daily.   Multiple Vitamin (MULTIVITAMIN) tablet Take 1 tablet by mouth daily. Men's health   Omega-3 Fatty Acids (FISH OIL CONCENTRATE PO) Take 1,000 mg by mouth daily.   OXYGEN Inhale 2 L into the lungs as directed. Every Shift; Day, Evening, Night   pantoprazole (PROTONIX) 40 MG tablet Take 1 tablet (40 mg total) by mouth daily.   polyethylene glycol (MIRALAX / GLYCOLAX) 17 g packet Take 17 g by mouth daily as needed.   pyridoxine (B-6) 100 MG tablet Take 100 mg by mouth daily.   tamsulosin (FLOMAX) 0.4 MG CAPS capsule Take 2 capsules (0.8 mg total) by mouth daily.   UNABLE TO FIND Diet: Dysphagia 3/Thin Liquids, Consistent carbohydrate   ZINC SULFATE PO Take 50 mg by mouth daily.   [DISCONTINUED] DOXYCYCLINE HYCLATE PO Take 100 mg by mouth in the morning and at bedtime. (Patient not taking: Reported on 04/14/2023)   No  facility-administered encounter medications on file as of 04/20/2023.     SIGNIFICANT DIAGNOSTIC EXAMS       ASSESSMENT/ PLAN:     Synthia Innocent NP [facility]  Contact [phone] Monday through Friday 8am- 5pm  After hours call [phone]

## 2023-04-28 DIAGNOSIS — M6281 Muscle weakness (generalized): Secondary | ICD-10-CM | POA: Diagnosis not present

## 2023-04-28 DIAGNOSIS — S82042D Displaced comminuted fracture of left patella, subsequent encounter for closed fracture with routine healing: Secondary | ICD-10-CM | POA: Diagnosis not present

## 2023-04-28 DIAGNOSIS — R262 Difficulty in walking, not elsewhere classified: Secondary | ICD-10-CM | POA: Diagnosis not present

## 2023-04-28 DIAGNOSIS — Z9181 History of falling: Secondary | ICD-10-CM | POA: Diagnosis not present

## 2023-04-28 DIAGNOSIS — J9611 Chronic respiratory failure with hypoxia: Secondary | ICD-10-CM | POA: Diagnosis not present

## 2023-04-29 DIAGNOSIS — Z9181 History of falling: Secondary | ICD-10-CM | POA: Diagnosis not present

## 2023-04-29 DIAGNOSIS — R262 Difficulty in walking, not elsewhere classified: Secondary | ICD-10-CM | POA: Diagnosis not present

## 2023-04-29 DIAGNOSIS — J9611 Chronic respiratory failure with hypoxia: Secondary | ICD-10-CM | POA: Diagnosis not present

## 2023-04-29 DIAGNOSIS — M6281 Muscle weakness (generalized): Secondary | ICD-10-CM | POA: Diagnosis not present

## 2023-04-29 DIAGNOSIS — S82042D Displaced comminuted fracture of left patella, subsequent encounter for closed fracture with routine healing: Secondary | ICD-10-CM | POA: Diagnosis not present

## 2023-04-30 DIAGNOSIS — J9611 Chronic respiratory failure with hypoxia: Secondary | ICD-10-CM | POA: Diagnosis not present

## 2023-04-30 DIAGNOSIS — Z9181 History of falling: Secondary | ICD-10-CM | POA: Diagnosis not present

## 2023-04-30 DIAGNOSIS — M6281 Muscle weakness (generalized): Secondary | ICD-10-CM | POA: Diagnosis not present

## 2023-04-30 DIAGNOSIS — S82042D Displaced comminuted fracture of left patella, subsequent encounter for closed fracture with routine healing: Secondary | ICD-10-CM | POA: Diagnosis not present

## 2023-04-30 DIAGNOSIS — R262 Difficulty in walking, not elsewhere classified: Secondary | ICD-10-CM | POA: Diagnosis not present

## 2023-05-01 DIAGNOSIS — S82042D Displaced comminuted fracture of left patella, subsequent encounter for closed fracture with routine healing: Secondary | ICD-10-CM | POA: Diagnosis not present

## 2023-05-01 DIAGNOSIS — J9611 Chronic respiratory failure with hypoxia: Secondary | ICD-10-CM | POA: Diagnosis not present

## 2023-05-01 DIAGNOSIS — R262 Difficulty in walking, not elsewhere classified: Secondary | ICD-10-CM | POA: Diagnosis not present

## 2023-05-01 DIAGNOSIS — Z9181 History of falling: Secondary | ICD-10-CM | POA: Diagnosis not present

## 2023-05-01 DIAGNOSIS — M6281 Muscle weakness (generalized): Secondary | ICD-10-CM | POA: Diagnosis not present

## 2023-05-02 DIAGNOSIS — M6281 Muscle weakness (generalized): Secondary | ICD-10-CM | POA: Diagnosis not present

## 2023-05-02 DIAGNOSIS — S82042D Displaced comminuted fracture of left patella, subsequent encounter for closed fracture with routine healing: Secondary | ICD-10-CM | POA: Diagnosis not present

## 2023-05-02 DIAGNOSIS — Z9181 History of falling: Secondary | ICD-10-CM | POA: Diagnosis not present

## 2023-05-02 DIAGNOSIS — R262 Difficulty in walking, not elsewhere classified: Secondary | ICD-10-CM | POA: Diagnosis not present

## 2023-05-02 DIAGNOSIS — J9611 Chronic respiratory failure with hypoxia: Secondary | ICD-10-CM | POA: Diagnosis not present

## 2023-05-04 DIAGNOSIS — M6281 Muscle weakness (generalized): Secondary | ICD-10-CM | POA: Diagnosis not present

## 2023-05-04 DIAGNOSIS — S82042D Displaced comminuted fracture of left patella, subsequent encounter for closed fracture with routine healing: Secondary | ICD-10-CM | POA: Diagnosis not present

## 2023-05-04 DIAGNOSIS — R262 Difficulty in walking, not elsewhere classified: Secondary | ICD-10-CM | POA: Diagnosis not present

## 2023-05-04 DIAGNOSIS — Z9181 History of falling: Secondary | ICD-10-CM | POA: Diagnosis not present

## 2023-05-04 DIAGNOSIS — J9611 Chronic respiratory failure with hypoxia: Secondary | ICD-10-CM | POA: Diagnosis not present

## 2023-05-05 DIAGNOSIS — S82042D Displaced comminuted fracture of left patella, subsequent encounter for closed fracture with routine healing: Secondary | ICD-10-CM | POA: Diagnosis not present

## 2023-05-05 DIAGNOSIS — R262 Difficulty in walking, not elsewhere classified: Secondary | ICD-10-CM | POA: Diagnosis not present

## 2023-05-05 DIAGNOSIS — M6281 Muscle weakness (generalized): Secondary | ICD-10-CM | POA: Diagnosis not present

## 2023-05-05 DIAGNOSIS — J9611 Chronic respiratory failure with hypoxia: Secondary | ICD-10-CM | POA: Diagnosis not present

## 2023-05-05 DIAGNOSIS — Z9181 History of falling: Secondary | ICD-10-CM | POA: Diagnosis not present

## 2023-05-06 DIAGNOSIS — R262 Difficulty in walking, not elsewhere classified: Secondary | ICD-10-CM | POA: Diagnosis not present

## 2023-05-06 DIAGNOSIS — M6281 Muscle weakness (generalized): Secondary | ICD-10-CM | POA: Diagnosis not present

## 2023-05-06 DIAGNOSIS — S82042D Displaced comminuted fracture of left patella, subsequent encounter for closed fracture with routine healing: Secondary | ICD-10-CM | POA: Diagnosis not present

## 2023-05-06 DIAGNOSIS — J9611 Chronic respiratory failure with hypoxia: Secondary | ICD-10-CM | POA: Diagnosis not present

## 2023-05-06 DIAGNOSIS — Z9181 History of falling: Secondary | ICD-10-CM | POA: Diagnosis not present

## 2023-05-07 DIAGNOSIS — R262 Difficulty in walking, not elsewhere classified: Secondary | ICD-10-CM | POA: Diagnosis not present

## 2023-05-07 DIAGNOSIS — M6281 Muscle weakness (generalized): Secondary | ICD-10-CM | POA: Diagnosis not present

## 2023-05-07 DIAGNOSIS — S82042D Displaced comminuted fracture of left patella, subsequent encounter for closed fracture with routine healing: Secondary | ICD-10-CM | POA: Diagnosis not present

## 2023-05-07 DIAGNOSIS — Z9181 History of falling: Secondary | ICD-10-CM | POA: Diagnosis not present

## 2023-05-07 DIAGNOSIS — J9611 Chronic respiratory failure with hypoxia: Secondary | ICD-10-CM | POA: Diagnosis not present

## 2023-05-08 DIAGNOSIS — R262 Difficulty in walking, not elsewhere classified: Secondary | ICD-10-CM | POA: Diagnosis not present

## 2023-05-08 DIAGNOSIS — Z9181 History of falling: Secondary | ICD-10-CM | POA: Diagnosis not present

## 2023-05-08 DIAGNOSIS — M6281 Muscle weakness (generalized): Secondary | ICD-10-CM | POA: Diagnosis not present

## 2023-05-08 DIAGNOSIS — S82042D Displaced comminuted fracture of left patella, subsequent encounter for closed fracture with routine healing: Secondary | ICD-10-CM | POA: Diagnosis not present

## 2023-05-08 DIAGNOSIS — J9611 Chronic respiratory failure with hypoxia: Secondary | ICD-10-CM | POA: Diagnosis not present

## 2023-05-11 DIAGNOSIS — Z9181 History of falling: Secondary | ICD-10-CM | POA: Diagnosis not present

## 2023-05-11 DIAGNOSIS — R262 Difficulty in walking, not elsewhere classified: Secondary | ICD-10-CM | POA: Diagnosis not present

## 2023-05-11 DIAGNOSIS — M6281 Muscle weakness (generalized): Secondary | ICD-10-CM | POA: Diagnosis not present

## 2023-05-11 DIAGNOSIS — S82042D Displaced comminuted fracture of left patella, subsequent encounter for closed fracture with routine healing: Secondary | ICD-10-CM | POA: Diagnosis not present

## 2023-05-11 DIAGNOSIS — J9611 Chronic respiratory failure with hypoxia: Secondary | ICD-10-CM | POA: Diagnosis not present

## 2023-05-12 ENCOUNTER — Encounter: Payer: Self-pay | Admitting: Internal Medicine

## 2023-05-12 ENCOUNTER — Non-Acute Institutional Stay (SKILLED_NURSING_FACILITY): Payer: 59 | Admitting: Internal Medicine

## 2023-05-12 DIAGNOSIS — J029 Acute pharyngitis, unspecified: Secondary | ICD-10-CM

## 2023-05-12 DIAGNOSIS — I10 Essential (primary) hypertension: Secondary | ICD-10-CM | POA: Diagnosis not present

## 2023-05-12 DIAGNOSIS — J9611 Chronic respiratory failure with hypoxia: Secondary | ICD-10-CM | POA: Diagnosis not present

## 2023-05-12 NOTE — Patient Instructions (Signed)
See assessment and plan under each diagnosis in the problem list and acutely for this visit 

## 2023-05-12 NOTE — Progress Notes (Signed)
   NURSING HOME LOCATION:  [facility] ROOM NUMBER: [id number]  CODE STATUS: Full code  PCP:  Synthia Innocent NP  This is a nursing facility follow up visit of chronic medical diagnoses & to document compliance with Regulation 483.30 (c) in The Long Term Care Survey Manual Phase 2 which mandates caregiver visit ( visits can alternate among physician, PA or NP as per statutes) within 10 days of 30 days / 60 days/ 90 days post admission to SNF date    Interim medical record and care since last SNF visit was updated with review of diagnostic studies and change in clinical status since last visit were documented.  HPI: He is a permanent resident of this facility with medical diagnoses of advanced, oxygen dependent COPD, essential hypertension, OSA, history of small bowel obstruction, and history of skin cancer.  Review of systems: He describes a sore throat for 3-4 days.  His chronic respiratory insufficiency symptoms are stable; but these do impact sleep.  He is on oxygen 24 hours a day.  He describes a cough with clear sputum with "air bubbles".  He also describes loose stool without frank diarrhea.  He has some dysphagia.  He also describes chronic dysuria for the last 3 months.  Urology evaluation has been completed.  Constitutional: No fever, significant weight change  Eyes: No redness, discharge, pain, vision change ENT/mouth: No nasal congestion,  purulent discharge, earache, change in hearing  Cardiovascular: No chest pain, palpitations, paroxysmal nocturnal dyspnea, claudication, edema  Respiratory: No hemoptysis, significant snoring, apnea   Gastrointestinal: No heartburn, abdominal pain, nausea /vomiting, rectal bleeding, melena Genitourinary: No hematuria, pyuria, incontinence, nocturia Musculoskeletal: No joint stiffness, joint swelling, weakness, pain Dermatologic: No rash, pruritus, change in appearance of skin Neurologic: No dizziness, headache, syncope, seizures,  numbness, tingling Psychiatric: No significant anxiety, depression, anorexia Endocrine: No change in hair/skin/nails, excessive thirst, excessive hunger, excessive urination  Hematologic/lymphatic: No significant bruising, lymphadenopathy, abnormal bleeding Allergy/immunology: No itchy/watery eyes, significant sneezing, urticaria, angioedema  Physical exam:  Pertinent or positive findings: He appears his age and chronically ill.  Facies tend to be somewhat blank.  Hair is disheveled.  He is completely edentulous.  There is mild erythema of the posterior pharynx without exudate or evidence of Candida. First and second heart sounds are accentuated.  Breath sounds are markedly decreased.  Pedal pulses are not palpable.  Clubbing of the nailbeds is noted.  General appearance: no acute distress, increased work of breathing is present.   Lymphatic: No lymphadenopathy about the head, neck, axilla. Eyes: No conjunctival inflammation or lid edema is present. There is no scleral icterus. Ears:  External ear exam shows no significant lesions or deformities.   Nose:  External nasal examination shows no deformity or inflammation. Nasal mucosa are pink and moist without lesions, exudates Oral exam:  Lips and gums are healthy appearing. There is no oropharyngeal erythema or exudate. Neck:  No thyromegaly, masses, tenderness noted.    Heart:  Normal rate and regular rhythm without gallop, murmur, click, rub .  Lungs: without wheezes, rhonchi, rales, rubs. Abdomen: Bowel sounds are normal. Abdomen is soft and nontender with no organomegaly, hernias, masses. GU: Deferred  Extremities:  No cyanosis, edema  Neurologic exam :Balance, Rhomberg, finger to nose testing could not be completed due to clinical state Skin: Warm & dry w/o tenting. No significant lesions or rash.  See summary under each active problem in the Problem List with associated updated therapeutic plan

## 2023-05-12 NOTE — Assessment & Plan Note (Signed)
O2 sats are excellent on present nasal O2 flow and current pulmonary toilet.  On exam he has a silent lungs of advanced COPD.  No change indicated at this time.

## 2023-05-12 NOTE — Assessment & Plan Note (Signed)
BP controlled; no change in antihypertensive medications  

## 2023-05-13 DIAGNOSIS — Z9181 History of falling: Secondary | ICD-10-CM | POA: Diagnosis not present

## 2023-05-13 DIAGNOSIS — S82042D Displaced comminuted fracture of left patella, subsequent encounter for closed fracture with routine healing: Secondary | ICD-10-CM | POA: Diagnosis not present

## 2023-05-13 DIAGNOSIS — M6281 Muscle weakness (generalized): Secondary | ICD-10-CM | POA: Diagnosis not present

## 2023-05-13 DIAGNOSIS — R262 Difficulty in walking, not elsewhere classified: Secondary | ICD-10-CM | POA: Diagnosis not present

## 2023-05-13 DIAGNOSIS — J9611 Chronic respiratory failure with hypoxia: Secondary | ICD-10-CM | POA: Diagnosis not present

## 2023-05-14 ENCOUNTER — Encounter: Payer: 59 | Admitting: Pharmacist

## 2023-05-14 DIAGNOSIS — M6281 Muscle weakness (generalized): Secondary | ICD-10-CM | POA: Diagnosis not present

## 2023-05-14 DIAGNOSIS — J9611 Chronic respiratory failure with hypoxia: Secondary | ICD-10-CM | POA: Diagnosis not present

## 2023-05-14 DIAGNOSIS — R262 Difficulty in walking, not elsewhere classified: Secondary | ICD-10-CM | POA: Diagnosis not present

## 2023-05-14 DIAGNOSIS — Z9181 History of falling: Secondary | ICD-10-CM | POA: Diagnosis not present

## 2023-05-14 DIAGNOSIS — S82042D Displaced comminuted fracture of left patella, subsequent encounter for closed fracture with routine healing: Secondary | ICD-10-CM | POA: Diagnosis not present

## 2023-05-15 ENCOUNTER — Non-Acute Institutional Stay (SKILLED_NURSING_FACILITY): Payer: 59 | Admitting: Adult Health

## 2023-05-15 ENCOUNTER — Encounter: Payer: Self-pay | Admitting: Adult Health

## 2023-05-15 DIAGNOSIS — J9611 Chronic respiratory failure with hypoxia: Secondary | ICD-10-CM

## 2023-05-15 DIAGNOSIS — M6281 Muscle weakness (generalized): Secondary | ICD-10-CM | POA: Diagnosis not present

## 2023-05-15 DIAGNOSIS — I700 Atherosclerosis of aorta: Secondary | ICD-10-CM | POA: Diagnosis not present

## 2023-05-15 DIAGNOSIS — S82042D Displaced comminuted fracture of left patella, subsequent encounter for closed fracture with routine healing: Secondary | ICD-10-CM | POA: Diagnosis not present

## 2023-05-15 DIAGNOSIS — R262 Difficulty in walking, not elsewhere classified: Secondary | ICD-10-CM | POA: Diagnosis not present

## 2023-05-15 DIAGNOSIS — F0150 Vascular dementia without behavioral disturbance: Secondary | ICD-10-CM

## 2023-05-15 DIAGNOSIS — Z9181 History of falling: Secondary | ICD-10-CM | POA: Diagnosis not present

## 2023-05-15 NOTE — Progress Notes (Signed)
Location:  [facility] Room Number: 107 Place of Service:  SNF (31)   CODE STATUS: full   Allergies  Allergen Reactions   Benzyl Alcohol Itching   Amlodipine Swelling    Swelling in feet   Aspirin Other (See Comments)    Upset stomach.   Other Hypertension    Hazelnuts   Penicillins Hives    Has patient had a PCN reaction causing immediate rash, facial/tongue/throat swelling, SOB or lightheadedness with hypotension: Yes Has patient had a PCN reaction causing severe rash involving mucus membranes or skin necrosis: No Has patient had a PCN reaction that required hospitalization No Has patient had a PCN reaction occurring within the last 10 years: No If all of the above answers are "NO", then may proceed with Cephalosporin use.    Sulfa Antibiotics Hives   Tylenol [Acetaminophen] Other (See Comments)    Pt reports he has had liver issues and was told not to take tylenol     Chief Complaint  Patient presents with   Acute Visit    Care plan meeting     HPI:  We have come together for her care plan meeting.  BIMS 12/15 mood 6/30: not eating well; nervous at times. Does ambulate with a walker without falls. He requires independent to moderate assist with adls. He is occasionally incontinent of bladder and bowel. Dietary: feeds self D2 diet; weight is 141.2 pounds; appetite 50-100%. Therapy: PT: independent for all transfers; ambulates 300 feet no assistive can push own 02 tank. Activities: prefers to stay in room. He continues to be followed for his chronic illnesses including: Aortic atherosclerosis  Chronic respiratory failure with hypoxia   Vascular dementia without behavioral disturbance  Past Medical History:  Diagnosis Date   Arthritis    Chronic rhinitis    Complication of anesthesia    patient usually hs to be intubated with anesthesia   COPD (chronic obstructive pulmonary disease) (HCC)    PFT 06-26-09 FEV1  1.75( 71%), FVC 3.65( 101%), FEV1% 48,  TLC 5.53(104%), DLCO 55%, no BD   Dyspnea    Hypertension    Nasal septal deviation    Nocturia    On home O2    2L N/C   OSA (obstructive sleep apnea) 04/21/2011   Partial small bowel obstruction (HCC) 10/18/2021   Skin cancer     Past Surgical History:  Procedure Laterality Date   APPENDECTOMY     BACK SURGERY     CATARACT EXTRACTION W/PHACO  01/06/2013   Procedure: CATARACT EXTRACTION PHACO AND INTRAOCULAR LENS PLACEMENT (IOC);  Surgeon: Gemma Payor, MD;  Location: [facility];  Service: Ophthalmology;  Laterality: Right;  CDE: 22.17   CATARACT EXTRACTION W/PHACO Left 06/20/2022   Procedure: CATARACT EXTRACTION PHACO AND INTRAOCULAR LENS PLACEMENT (IOC);  Surgeon: Fabio Pierce, MD;  Location: [facility];  Service: Ophthalmology;  Laterality: Left;  CDE 20.85   FEMORAL HERNIA REPAIR Right 11/19/2022   Procedure: HERNIA REPAIR FEMORAL WITH MESH;  Surgeon: Lucretia Roers, MD;  Location: [facility];  Service: General;  Laterality: Right;   INGUINAL HERNIA REPAIR Left 12/09/2021   Procedure: HERNIA REPAIR INGUINAL ADULT WITH MESH;  Surgeon: Lucretia Roers, MD;  Location: [facility];  Service: General;  Laterality: Left;   INGUINAL HERNIA REPAIR Right 03/19/2022   Procedure: HERNIA REPAIR INGUINAL ADULT WITH MESH;  Surgeon: Lucretia Roers, MD;  Location: [facility];  Service: General;  Laterality: Right;   ORIF PATELLA Left 01/22/2023  Procedure: OPEN REDUCTION INTERNAL FIXATION (ORIF) PATELLA;  Surgeon: [name], MD;  Location: [facility];  Service: Orthopedics;  Laterality: Left;    Social History   Socioeconomic History   Marital status: Divorced    Spouse name: Not on file   Number of children: 3   Years of education: Not on file   Highest education level: Not on file  Occupational History   Occupation: Armed forces technical officer: RETIRED  Tobacco Use   Smoking status: Former    Packs/day: 1.50    Years: 54.00    Additional pack years: 0.00    Total pack years: 81.00    Types:  Cigarettes    Quit date: 12/01/2001    Years since quitting: 21.4   Smokeless tobacco: Never  Vaping Use   Vaping Use: Never used  Substance and Sexual Activity   Alcohol use: No   Drug use: No   Sexual activity: Not on file  Other Topics Concern   Not on file  Social History Narrative   Lives with sister.    Social Determinants of Health   Financial Resource Strain: Low Risk  (02/07/2022)   Overall Financial Resource Strain (CARDIA)    Difficulty of Paying Living Expenses: Not hard at all  Food Insecurity: No Food Insecurity (01/20/2023)   Hunger Vital Sign    Worried About Running Out of Food in the Last Year: Never true    Ran Out of Food in the Last Year: Never true  Transportation Needs: No Transportation Needs (01/20/2023)   PRAPARE - Administrator, Civil Service (Medical): No    Lack of Transportation (Non-Medical): No  Physical Activity: Insufficiently Active (02/07/2022)   Exercise Vital Sign    Days of Exercise per Week: 3 days    Minutes of Exercise per Session: 20 min  Stress: No Stress Concern Present (02/07/2022)   [employer] of Occupational Health - Occupational Stress Questionnaire    Feeling of Stress : Not at all  Social Connections: Socially Isolated (02/07/2022)   Social Connection and Isolation Panel [NHANES]    Frequency of Communication with Friends and Family: More than three times a week    Frequency of Social Gatherings with Friends and Family: More than three times a week    Attends Religious Services: Never    Database administrator or Organizations: No    Attends Banker Meetings: Never    Marital Status: Divorced  Catering manager Violence: Not At Risk (01/20/2023)   Humiliation, Afraid, Rape, and Kick questionnaire    Fear of Current or Ex-Partner: No    Emotionally Abused: No    Physically Abused: No    Sexually Abused: No   Family History  Problem Relation Age of Onset   Lung cancer Mother       VITAL  SIGNS BP 120/74   Pulse 78   Temp 98.6 F (37 C)   Resp (!) 22   Ht 5\' 5"  (1.651 m)   Wt 143 lb (64.9 kg)   SpO2 91%   BMI 23.80 kg/m   Outpatient Encounter Medications as of 05/15/2023  Medication Sig   albuterol (VENTOLIN HFA) 108 (90 Base) MCG/ACT inhaler Inhale 2 puffs into the lungs every 6 (six) hours as needed for wheezing or shortness of breath.   Albuterol Sulfate 2.5 MG/0.5ML NEBU Inhale 1 each into the lungs 3 (three) times daily.   Amino Acids-Protein Hydrolys (FEEDING SUPPLEMENT, PRO-STAT SUGAR FREE  64,) LIQD Take 30 mLs by mouth 3 (three) times daily with meals.   ascorbic acid (VITAMIN C) 500 MG tablet Take 1,000 mg by mouth daily.   Budeson-Glycopyrrol-Formoterol (BREZTRI AEROSPHERE) 160-9-4.8 MCG/ACT AERO Inhale 2 puffs into the lungs in the morning and at bedtime.   cholecalciferol (VITAMIN D) 25 MCG (1000 UNIT) tablet Take 3,000 Units by mouth daily.   feeding supplement (ENSURE ENLIVE / ENSURE PLUS) LIQD Take 237 mLs by mouth 3 (three) times daily between meals.   ferrous sulfate 325 (65 FE) MG EC tablet Take 325 mg by mouth every Monday, Wednesday, and Friday.   finasteride (PROSCAR) 5 MG tablet Take 1 tablet (5 mg total) by mouth daily.   guaiFENesin-dextromethorphan (ROBITUSSIN DM) 100-10 MG/5ML syrup Take 10 mLs by mouth every 4 (four) hours as needed for cough.   ipratropium-albuterol (DUONEB) 0.5-2.5 (3) MG/3ML SOLN Take 3 mLs by nebulization every 6 (six) hours as needed (wheezing, shortness of breath).   ipratropium-albuterol (DUONEB) 0.5-2.5 (3) MG/3ML SOLN Take 3 mLs by nebulization every 6 (six) hours.   losartan (COZAAR) 25 MG tablet Take 12.5 mg by mouth daily.   methocarbamol (ROBAXIN) 500 MG tablet Take 500 mg by mouth 3 (three) times daily.   Multiple Vitamin (MULTIVITAMIN) tablet Take 1 tablet by mouth daily. Men's health   Omega-3 Fatty Acids (FISH OIL CONCENTRATE PO) Take 1,000 mg by mouth daily.   OXYGEN Inhale 2 L into the lungs as directed. Every  Shift; Day, Evening, Night   pantoprazole (PROTONIX) 40 MG tablet Take 1 tablet (40 mg total) by mouth daily.   polyethylene glycol (MIRALAX / GLYCOLAX) 17 g packet Take 17 g by mouth daily as needed.   pyridoxine (B-6) 100 MG tablet Take 100 mg by mouth daily.   tamsulosin (FLOMAX) 0.4 MG CAPS capsule Take 2 capsules (0.8 mg total) by mouth daily.   UNABLE TO FIND Diet: Dysphagia 3/Thin Liquids, Consistent carbohydrate   ZINC SULFATE PO Take 50 mg by mouth daily.   No facility-administered encounter medications on file as of 05/15/2023.     SIGNIFICANT DIAGNOSTIC EXAMS  LABS REVIEWED; PREVIOUS   01-20-23; wbc 7.9 hgb 15.0; hct 45.6; mcv 89.2 plt 246; glucose 118 bun 31; creat 0.96; k+ 4.2; na++ 140; ca 10.4; gfr >60; protein 7.0; albumin 3.9 01-23-23; wbc 13.0; hgb 11.3; hct 34.0; mcv 90.7 plt 166;glucose 149; bun 17; creat 0.84; k+ 3.9; na++ 134; ca 8.6; gfr >60; phos 2.4 albumin 2.7; mag 1.9; vitamin B12: 837; folate 15.5; iron 19; tibc 320; ferritin 72.  01-25-23: hgb A1c 6.8 01-27-23: wbc 11.0; hgb 11.2; hct 34.4; mcv 88.9 plt 215; glucose 169; bun 24; creat 0.72; k+ 3.4; na++ 138; ca 8.9; gfr >60; protein 5.6; albumin 2.5; mag 1.9 01-29-23; wbc 11.7; hgb 11.5; hct 35.8; mcv 90.4 plt 235; glucose 117; bun 32; creat 0.88; k+ 3.5; na++ 138; ca 8.6; gfr >60; protein 5.7 albumin 2.6 mag 2.1 03-12-23 wbc 7.8; hgb 12.3; hct 38.6; mcv 91.3 plt 289; glucose 114; bun 24; creat 0.89; k+ 4.0; na++ 135; ca 9.1 gfr >60; protein 6.6 albumin 3.1 tsh 3.506; vitamin B 12: 1393 rpr: nr   NO NEW LABS.    Review of Systems  Constitutional:  Negative for malaise/fatigue.  Respiratory:  Negative for cough and shortness of breath.   Cardiovascular:  Negative for chest pain, palpitations and leg swelling.  Gastrointestinal:  Negative for abdominal pain, constipation and heartburn.  Musculoskeletal:  Negative for back pain, joint pain and  myalgias.  Skin: Negative.   Neurological:  Negative for dizziness.   Psychiatric/Behavioral:  The patient is not nervous/anxious.    Physical Exam Constitutional:      General: He is not in acute distress.    Appearance: He is well-developed. He is not diaphoretic.  Neck:     Thyroid: No thyromegaly.  Cardiovascular:     Rate and Rhythm: Normal rate and regular rhythm.     Pulses: Normal pulses.     Heart sounds: Normal heart sounds.  Pulmonary:     Effort: Pulmonary effort is normal. No respiratory distress.     Breath sounds: Normal breath sounds.     Comments: 02 Abdominal:     General: Bowel sounds are normal. There is no distension.     Palpations: Abdomen is soft.     Tenderness: There is no abdominal tenderness.  Musculoskeletal:        General: Normal range of motion.     Cervical back: Neck supple.  Lymphadenopathy:     Cervical: No cervical adenopathy.  Skin:    General: Skin is warm and dry.  Neurological:     Mental Status: He is alert and oriented to person, place, and time.  Psychiatric:        Mood and Affect: Mood normal.        ASSESSMENT/ PLAN:  TODAY  Aortic atherosclerosis Chronic respiratory failure with hypoxia Vascular dementia without behavioral disturbance  Will continue current medications Will continue current plan of care Will continue to monitor his status.   Time spent with patient: 40 minutes: medications; dietary; plan of care.    Synthia Innocent NP [facility]  call [phone]

## 2023-05-16 DIAGNOSIS — M6281 Muscle weakness (generalized): Secondary | ICD-10-CM | POA: Diagnosis not present

## 2023-05-16 DIAGNOSIS — R262 Difficulty in walking, not elsewhere classified: Secondary | ICD-10-CM | POA: Diagnosis not present

## 2023-05-16 DIAGNOSIS — Z9181 History of falling: Secondary | ICD-10-CM | POA: Diagnosis not present

## 2023-05-16 DIAGNOSIS — J9611 Chronic respiratory failure with hypoxia: Secondary | ICD-10-CM | POA: Diagnosis not present

## 2023-05-16 DIAGNOSIS — S82042D Displaced comminuted fracture of left patella, subsequent encounter for closed fracture with routine healing: Secondary | ICD-10-CM | POA: Diagnosis not present

## 2023-05-18 DIAGNOSIS — R262 Difficulty in walking, not elsewhere classified: Secondary | ICD-10-CM | POA: Diagnosis not present

## 2023-05-18 DIAGNOSIS — S82042D Displaced comminuted fracture of left patella, subsequent encounter for closed fracture with routine healing: Secondary | ICD-10-CM | POA: Diagnosis not present

## 2023-05-18 DIAGNOSIS — J9611 Chronic respiratory failure with hypoxia: Secondary | ICD-10-CM | POA: Diagnosis not present

## 2023-05-18 DIAGNOSIS — Z9181 History of falling: Secondary | ICD-10-CM | POA: Diagnosis not present

## 2023-05-18 DIAGNOSIS — M6281 Muscle weakness (generalized): Secondary | ICD-10-CM | POA: Diagnosis not present

## 2023-05-19 DIAGNOSIS — M6281 Muscle weakness (generalized): Secondary | ICD-10-CM | POA: Diagnosis not present

## 2023-05-19 DIAGNOSIS — Z9181 History of falling: Secondary | ICD-10-CM | POA: Diagnosis not present

## 2023-05-19 DIAGNOSIS — J9611 Chronic respiratory failure with hypoxia: Secondary | ICD-10-CM | POA: Diagnosis not present

## 2023-05-19 DIAGNOSIS — R262 Difficulty in walking, not elsewhere classified: Secondary | ICD-10-CM | POA: Diagnosis not present

## 2023-05-19 DIAGNOSIS — S82042D Displaced comminuted fracture of left patella, subsequent encounter for closed fracture with routine healing: Secondary | ICD-10-CM | POA: Diagnosis not present

## 2023-05-20 DIAGNOSIS — R1312 Dysphagia, oropharyngeal phase: Secondary | ICD-10-CM | POA: Diagnosis not present

## 2023-05-21 DIAGNOSIS — R1312 Dysphagia, oropharyngeal phase: Secondary | ICD-10-CM | POA: Diagnosis not present

## 2023-05-22 DIAGNOSIS — R1312 Dysphagia, oropharyngeal phase: Secondary | ICD-10-CM | POA: Diagnosis not present

## 2023-05-26 DIAGNOSIS — R1312 Dysphagia, oropharyngeal phase: Secondary | ICD-10-CM | POA: Diagnosis not present

## 2023-05-27 DIAGNOSIS — R1312 Dysphagia, oropharyngeal phase: Secondary | ICD-10-CM | POA: Diagnosis not present

## 2023-05-28 DIAGNOSIS — R1312 Dysphagia, oropharyngeal phase: Secondary | ICD-10-CM | POA: Diagnosis not present

## 2023-05-29 DIAGNOSIS — R1312 Dysphagia, oropharyngeal phase: Secondary | ICD-10-CM | POA: Diagnosis not present

## 2023-05-30 DIAGNOSIS — R1312 Dysphagia, oropharyngeal phase: Secondary | ICD-10-CM | POA: Diagnosis not present

## 2023-06-01 DIAGNOSIS — R1312 Dysphagia, oropharyngeal phase: Secondary | ICD-10-CM | POA: Diagnosis not present

## 2023-06-02 DIAGNOSIS — R1312 Dysphagia, oropharyngeal phase: Secondary | ICD-10-CM | POA: Diagnosis not present

## 2023-06-03 ENCOUNTER — Non-Acute Institutional Stay (SKILLED_NURSING_FACILITY): Payer: 59 | Admitting: Adult Health

## 2023-06-03 ENCOUNTER — Telehealth: Payer: Self-pay | Admitting: *Deleted

## 2023-06-03 ENCOUNTER — Encounter: Payer: Self-pay | Admitting: Adult Health

## 2023-06-03 DIAGNOSIS — D509 Iron deficiency anemia, unspecified: Secondary | ICD-10-CM | POA: Diagnosis not present

## 2023-06-03 DIAGNOSIS — K5909 Other constipation: Secondary | ICD-10-CM

## 2023-06-03 DIAGNOSIS — T380X5A Adverse effect of glucocorticoids and synthetic analogues, initial encounter: Secondary | ICD-10-CM

## 2023-06-03 DIAGNOSIS — R739 Hyperglycemia, unspecified: Secondary | ICD-10-CM | POA: Diagnosis not present

## 2023-06-03 NOTE — Telephone Encounter (Signed)
Patient came in to office to schedule appointment with surgeon. States that he has lesion in L side of nare that he would like removed. Patient is oxygen dependent and area may be ulceration from oxygen cannula.   Advised to contact ENT for appointment. Patient requested writer contact office to schedule as he is hard of hearing.   Call placed to [facility]: [facility] -  [address], [address] [place], [place] [address] [phone] telephone/ [phone]~ fax.   Appointment scheduled with Dione Housekeeper, MD,  Facial Plastic and Reconstructive SurgeryENT/Head and Neck Surgery for 06/29/2023 @ 9:45am.

## 2023-06-03 NOTE — Progress Notes (Unsigned)
Location:  [facility] Room Number: 107D Place of Service:  SNF (31)   CODE STATUS: Full Code  Allergies  Allergen Reactions   Benzyl Alcohol Itching   Amlodipine Swelling    Swelling in feet   Aspirin Other (See Comments)    Upset stomach.   Other Hypertension    Hazelnuts   Penicillins Hives    Has patient had a PCN reaction causing immediate rash, facial/tongue/throat swelling, SOB or lightheadedness with hypotension: Yes Has patient had a PCN reaction causing severe rash involving mucus membranes or skin necrosis: No Has patient had a PCN reaction that required hospitalization No Has patient had a PCN reaction occurring within the last 10 years: No If all of the above answers are "NO", then may proceed with Cephalosporin use.    Sulfa Antibiotics Hives   Tylenol [Acetaminophen] Other (See Comments)    Pt reports he has had liver issues and was told not to take tylenol     Chief Complaint  Patient presents with   Medical Management of Chronic Issues    HPI:    Past Medical History:  Diagnosis Date   Arthritis    Chronic rhinitis    Complication of anesthesia    patient usually hs to be intubated with anesthesia   COPD (chronic obstructive pulmonary disease) (HCC)    PFT 06-26-09 FEV1  1.75( 71%), FVC 3.65( 101%), FEV1% 48, TLC 5.53(104%), DLCO 55%, no BD   Dyspnea    Hypertension    Nasal septal deviation    Nocturia    On home O2    2L N/C   OSA (obstructive sleep apnea) 04/21/2011   Partial small bowel obstruction (HCC) 10/18/2021   Skin cancer     Past Surgical History:  Procedure Laterality Date   APPENDECTOMY     BACK SURGERY     CATARACT EXTRACTION W/PHACO  01/06/2013   Procedure: CATARACT EXTRACTION PHACO AND INTRAOCULAR LENS PLACEMENT (IOC);  Surgeon: Gemma Payor, MD;  Location: [facility];  Service: Ophthalmology;  Laterality: Right;  CDE: 22.17   CATARACT EXTRACTION W/PHACO Left 06/20/2022   Procedure: CATARACT EXTRACTION PHACO  AND INTRAOCULAR LENS PLACEMENT (IOC);  Surgeon: Fabio Pierce, MD;  Location: [facility];  Service: Ophthalmology;  Laterality: Left;  CDE 20.85   FEMORAL HERNIA REPAIR Right 11/19/2022   Procedure: HERNIA REPAIR FEMORAL WITH MESH;  Surgeon: Lucretia Roers, MD;  Location: [facility];  Service: General;  Laterality: Right;   INGUINAL HERNIA REPAIR Left 12/09/2021   Procedure: HERNIA REPAIR INGUINAL ADULT WITH MESH;  Surgeon: Lucretia Roers, MD;  Location: [facility];  Service: General;  Laterality: Left;   INGUINAL HERNIA REPAIR Right 03/19/2022   Procedure: HERNIA REPAIR INGUINAL ADULT WITH MESH;  Surgeon: Lucretia Roers, MD;  Location: [facility];  Service: General;  Laterality: Right;   ORIF PATELLA Left 01/22/2023   Procedure: OPEN REDUCTION INTERNAL FIXATION (ORIF) PATELLA;  Surgeon: [name], MD;  Location: [facility];  Service: Orthopedics;  Laterality: Left;    Social History   Socioeconomic History   Marital status: Divorced    Spouse name: Not on file   Number of children: 3   Years of education: Not on file   Highest education level: Not on file  Occupational History   Occupation: Armed forces technical officer: RETIRED  Tobacco Use   Smoking status: Former    Packs/day: 1.50    Years: 54.00    Additional pack years:  0.00    Total pack years: 81.00    Types: Cigarettes    Quit date: 12/01/2001    Years since quitting: 21.5   Smokeless tobacco: Never  Vaping Use   Vaping Use: Never used  Substance and Sexual Activity   Alcohol use: No   Drug use: No   Sexual activity: Not on file  Other Topics Concern   Not on file  Social History Narrative   Lives with sister.    Social Determinants of Health   Financial Resource Strain: Low Risk  (02/07/2022)   Overall Financial Resource Strain (CARDIA)    Difficulty of Paying Living Expenses: Not hard at all  Food Insecurity: No Food Insecurity (01/20/2023)   Hunger Vital Sign    Worried About Running Out of Food in the Last Year: Never  true    Ran Out of Food in the Last Year: Never true  Transportation Needs: No Transportation Needs (01/20/2023)   PRAPARE - Administrator, Civil Service (Medical): No    Lack of Transportation (Non-Medical): No  Physical Activity: Insufficiently Active (02/07/2022)   Exercise Vital Sign    Days of Exercise per Week: 3 days    Minutes of Exercise per Session: 20 min  Stress: No Stress Concern Present (02/07/2022)   [employer] of Occupational Health - Occupational Stress Questionnaire    Feeling of Stress : Not at all  Social Connections: Socially Isolated (02/07/2022)   Social Connection and Isolation Panel [NHANES]    Frequency of Communication with Friends and Family: More than three times a week    Frequency of Social Gatherings with Friends and Family: More than three times a week    Attends Religious Services: Never    Database administrator or Organizations: No    Attends Banker Meetings: Never    Marital Status: Divorced  Catering manager Violence: Not At Risk (01/20/2023)   Humiliation, Afraid, Rape, and Kick questionnaire    Fear of Current or Ex-Partner: No    Emotionally Abused: No    Physically Abused: No    Sexually Abused: No   Family History  Problem Relation Age of Onset   Lung cancer Mother       VITAL SIGNS BP (!) 149/63   Pulse 66   Temp 98.4 F (36.9 C)   Resp 16   Ht 5\' 5"  (1.651 m)   Wt 142 lb 3.2 oz (64.5 kg)   SpO2 92%   BMI 23.66 kg/m   Outpatient Encounter Medications as of 06/03/2023  Medication Sig   albuterol (VENTOLIN HFA) 108 (90 Base) MCG/ACT inhaler Inhale 2 puffs into the lungs every 6 (six) hours as needed for wheezing or shortness of breath.   Albuterol Sulfate 2.5 MG/0.5ML NEBU Inhale 1 each into the lungs 3 (three) times daily.   ascorbic acid (VITAMIN C) 500 MG tablet Take 1,000 mg by mouth daily.   benzocaine-menthol (CHLORASEPTIC) 6-10 MG lozenge Take 1 lozenge by mouth every 4 (four) hours as  needed for sore throat.   Budeson-Glycopyrrol-Formoterol (BREZTRI AEROSPHERE) 160-9-4.8 MCG/ACT AERO Inhale 2 puffs into the lungs in the morning and at bedtime.   cholecalciferol (VITAMIN D) 25 MCG (1000 UNIT) tablet Take 3,000 Units by mouth daily.   feeding supplement (ENSURE ENLIVE / ENSURE PLUS) LIQD Take 237 mLs by mouth 3 (three) times daily between meals.   ferrous sulfate 325 (65 FE) MG EC tablet Take 325 mg by mouth every Monday, Wednesday, and  Friday.   finasteride (PROSCAR) 5 MG tablet Take 1 tablet (5 mg total) by mouth daily.   ipratropium-albuterol (DUONEB) 0.5-2.5 (3) MG/3ML SOLN Take 3 mLs by nebulization every 6 (six) hours as needed (wheezing, shortness of breath).   ipratropium-albuterol (DUONEB) 0.5-2.5 (3) MG/3ML SOLN Take 3 mLs by nebulization every 6 (six) hours.   losartan (COZAAR) 25 MG tablet Take 12.5 mg by mouth daily.   methocarbamol (ROBAXIN) 500 MG tablet Take 500 mg by mouth 3 (three) times daily.   Multiple Vitamin (MULTIVITAMIN) tablet Take 1 tablet by mouth daily. Men's health   OXYGEN Inhale 2 L into the lungs as directed. Every Shift; Day, Evening, Night   pantoprazole (PROTONIX) 40 MG tablet Take 1 tablet (40 mg total) by mouth daily.   pyridoxine (B-6) 100 MG tablet Take 100 mg by mouth daily.   tamsulosin (FLOMAX) 0.4 MG CAPS capsule Take 2 capsules (0.8 mg total) by mouth daily.   UNABLE TO FIND Diet: Dysphagia 3/Thin Liquids, Consistent carbohydrate   white petrolatum ointment Apply topically once.   ZINC SULFATE PO Take 50 mg by mouth daily.   [DISCONTINUED] Amino Acids-Protein Hydrolys (FEEDING SUPPLEMENT, PRO-STAT SUGAR FREE 64,) LIQD Take 30 mLs by mouth 3 (three) times daily with meals.   [DISCONTINUED] guaiFENesin-dextromethorphan (ROBITUSSIN DM) 100-10 MG/5ML syrup Take 10 mLs by mouth every 4 (four) hours as needed for cough.   [DISCONTINUED] Omega-3 Fatty Acids (FISH OIL CONCENTRATE PO) Take 1,000 mg by mouth daily.   [DISCONTINUED]  polyethylene glycol (MIRALAX / GLYCOLAX) 17 g packet Take 17 g by mouth daily as needed.   No facility-administered encounter medications on file as of 06/03/2023.     SIGNIFICANT DIAGNOSTIC EXAMS       ASSESSMENT/ PLAN:     Synthia Innocent NP [facility]  Contact [phone] Monday through Friday 8am- 5pm  After hours call [phone]

## 2023-06-16 ENCOUNTER — Other Ambulatory Visit (INDEPENDENT_AMBULATORY_CARE_PROVIDER_SITE_OTHER): Payer: 59

## 2023-06-16 ENCOUNTER — Ambulatory Visit (INDEPENDENT_AMBULATORY_CARE_PROVIDER_SITE_OTHER): Payer: 59 | Admitting: Orthopedic Surgery

## 2023-06-16 ENCOUNTER — Encounter: Payer: Self-pay | Admitting: Orthopedic Surgery

## 2023-06-16 VITALS — BP 110/70 | HR 80 | Ht 65.0 in | Wt 142.0 lb

## 2023-06-16 DIAGNOSIS — S82042D Displaced comminuted fracture of left patella, subsequent encounter for closed fracture with routine healing: Secondary | ICD-10-CM

## 2023-06-16 NOTE — Progress Notes (Signed)
Orthopaedic Postop Note  Assessment: Michael Evans is a 82 y.o. male s/p operative fixation of left patella fracture  DOS: 01/22/2023  Plan: Michael Evans has healed his fracture.  Slight flexion contracture, lacking approximately 10 degrees of full extension.  He comfortably gets beyond 90 degrees.  He is ambulating without assistive device.  Hardware is prominent over the anterior knee, which is expected given his body stature.  Otherwise, I am pleased with his improvements.  He can return to clinic as needed.  Follow-up: Return if symptoms worsen or fail to improve. XR at next visit: Left knee  Subjective:  Chief Complaint  Patient presents with   Knee Pain    Left     History of Present Illness: Michael Evans is a 82 y.o. male who presents following the above stated procedure.  Surgery was approximately 5 months ago.  He remains in the nursing facility.  He is getting better.  Occasional sharp pains in the medial knee.  No fevers or chills.  He is ambulating without assistive device.   Review of Systems: No fevers or chills No numbness or tingling No Chest Pain + shortness of breath   Objective: BP 110/70   Pulse 80   Ht 5\' 5"  (1.651 m)   Wt 142 lb (64.4 kg)   BMI 23.63 kg/m   Physical Exam:  Elderly male.  Seated in wheelchair.  Nasal cannula and supplemental oxygen in place.  He has a productive cough.   Alert and oriented.  Surgical incision is healed.  No redness.  No swelling about the knee.   IMAGING: I personally ordered and reviewed the following images:  AP and lateral x-rays of the left knee were obtained in clinic today.  These compared to available x-rays.  Hardware is intact.  K wires remain evaluation.  There has been interval consolidation to the fracture site.  There is mild step-off within the patellofemoral joint.  No additional injuries.  Impression: Healed left comminuted patella fracture without hardware failure.  [name],  MD 06/16/2023 2:06 PM

## 2023-06-26 ENCOUNTER — Encounter: Payer: Self-pay | Admitting: Internal Medicine

## 2023-06-26 ENCOUNTER — Non-Acute Institutional Stay (SKILLED_NURSING_FACILITY): Payer: 59 | Admitting: Internal Medicine

## 2023-06-26 DIAGNOSIS — R0902 Hypoxemia: Secondary | ICD-10-CM

## 2023-06-26 DIAGNOSIS — H1010 Acute atopic conjunctivitis, unspecified eye: Secondary | ICD-10-CM | POA: Diagnosis not present

## 2023-06-26 DIAGNOSIS — J309 Allergic rhinitis, unspecified: Secondary | ICD-10-CM | POA: Diagnosis not present

## 2023-06-26 DIAGNOSIS — I10 Essential (primary) hypertension: Secondary | ICD-10-CM | POA: Diagnosis not present

## 2023-06-26 DIAGNOSIS — J9611 Chronic respiratory failure with hypoxia: Secondary | ICD-10-CM | POA: Diagnosis not present

## 2023-06-26 NOTE — Progress Notes (Signed)
   NURSING HOME LOCATION:  [facility] ROOM NUMBER:  [id number]  CODE STATUS:  Full Code  PCP:  Synthia Innocent NP  This is a nursing facility follow up visit for specific acute issue of increased cough and congestion noted the evening of 7/25.  This was associated with some shortness of breath but no clinical distress.  Nebulized albuterol treatment given as per prn orders at 1925.  O2 sats prior to the treatment was 84% on 2 L of nasal oxygen.  Immediately after the neb treatment O2 sats dropped into the 70s but he had no clinical distress.  He used his routine inhaler at 2000 and had no further desaturation below 90%. In addition to as needed bronchodilator therapy he is on maintenance Breztri 2 puffs twice daily, DuoNeb every 6 hours, and albuterol nebulization 3 times daily. He had been admitted to this facility 01/29/2023 after sustaining a left patella fracture after a mechanical fall from a ladder.  Significant past history includes oxygen dependent emphysema and OSA.  He has an 81-pack-year history of smoking.  Review of systems: He actually states that he has had to have the nebulizer treatments more frequently beginning 7/24.He states he has been producing yellow sputum possibly up to 2-3 tablespoons/day.  He describes itchy, watery eyes.  He states that he has to have a "sore" removed from the left intranasal area on 7/29. He cannot identify the physician.  He does describe some dysphagia which he relates to having difficulty choosing chewing his food as he is edentulous.  Constitutional: No fever, significant weight change Eyes: No redness, discharge, pain, vision change ENT/mouth: No purulent discharge, earache, new change in hearing, sore throat  Cardiovascular: No chest pain, palpitations, paroxysmal nocturnal dyspnea, claudication, edema  Respiratory: No hemoptysis,  significant snoring, apnea   Gastrointestinal: No heartburn, abdominal pain, nausea /vomiting, rectal  bleeding, melena, change in bowels Genitourinary: No dysuria, hematuria, pyuria, incontinence, nocturia Musculoskeletal: No joint stiffness, joint swelling, weakness Dermatologic: no pruritus, change in appearance of skin Neurologic: No dizziness, headache Psychiatric: No significant anxiety, depression, insomnia, anorexia Endocrine: No change in hair/skin/nails, excessive thirst, excessive hunger, excessive urination  Hematologic/lymphatic: No significant bruising, lymphadenopathy, abnormal bleeding Allergy/immunology: No significant sneezing, urticaria, angioedema  Physical exam:  Pertinent or positive findings: He appears chronically ill. He is on nasal O2. Marked auditory acuity deficit present. Dramatic rhinitis is present with copious clear drainage bilaterally.A small slightly erythematous polypoid lesion is present @ L inferior septum. He is edentulous.Heart sounds heard best @ LLSB.Low grade rhonchi @ RLL, otherwise markedly decreased breath sounds.Raspy upper airway sounds with cough.Clubbing of nailbeds present.  General appearance: no acute distress   Lymphatic: No lymphadenopathy about the head, neck, axilla. Eyes: No conjunctival inflammation or lid edema is present. There is no scleral icterus. Ears:  External ear exam shows no significant lesions or deformities.   Nose:  External nasal examination shows no deformity or inflammation.Neck:  No thyromegaly, masses, tenderness noted.    Heart:  No without gallop, murmur, click, rub .  Lungs:  without wheezes, rales, rubs. Abdomen: Bowel sounds are normal. Abdomen is soft and nontender with no organomegaly, hernias, masses. GU: Deferred  Extremities:  No cyanosis, edema  Skin: Warm & dry w/o tenting. No significant  rash.  See summary under each active problem in the Problem List with associated updated therapeutic plan

## 2023-06-26 NOTE — Patient Instructions (Signed)
See assessment and plan under each diagnosis in the problem list and acutely for this visit 

## 2023-06-27 NOTE — Assessment & Plan Note (Signed)
Possibly @ risk for dysrrhythmias with potential total albuterol doses with prn & maintenance doses. Assess response to oral steroid trial. Safest therapy may be maintenance low dose steroids. Pulmonary F/U with Dr Sherene Sires will be discussed with NP.

## 2023-06-27 NOTE — Assessment & Plan Note (Signed)
If mild systolic HTN persists low dose ARB will be increased.

## 2023-06-29 DIAGNOSIS — J3489 Other specified disorders of nose and nasal sinuses: Secondary | ICD-10-CM | POA: Diagnosis not present

## 2023-06-29 DIAGNOSIS — J342 Deviated nasal septum: Secondary | ICD-10-CM | POA: Diagnosis not present

## 2023-06-29 DIAGNOSIS — Z9981 Dependence on supplemental oxygen: Secondary | ICD-10-CM | POA: Diagnosis not present

## 2023-07-13 ENCOUNTER — Non-Acute Institutional Stay (SKILLED_NURSING_FACILITY): Payer: 59 | Admitting: Adult Health

## 2023-07-13 ENCOUNTER — Encounter: Payer: Self-pay | Admitting: Adult Health

## 2023-07-13 DIAGNOSIS — F0150 Vascular dementia without behavioral disturbance: Secondary | ICD-10-CM

## 2023-07-13 DIAGNOSIS — I700 Atherosclerosis of aorta: Secondary | ICD-10-CM | POA: Diagnosis not present

## 2023-07-13 DIAGNOSIS — I10 Essential (primary) hypertension: Secondary | ICD-10-CM

## 2023-07-13 DIAGNOSIS — E1165 Type 2 diabetes mellitus with hyperglycemia: Secondary | ICD-10-CM | POA: Insufficient documentation

## 2023-07-13 NOTE — Progress Notes (Signed)
Location:  [facility] Room Number: [id number] Place of Service:  SNF (31)   CODE STATUS: full   Allergies  Allergen Reactions   Benzyl Alcohol Itching   Amlodipine Swelling    Swelling in feet   Aspirin Other (See Comments)    Upset stomach.   Other Hypertension    Hazelnuts   Penicillins Hives    Has patient had a PCN reaction causing immediate rash, facial/tongue/throat swelling, SOB or lightheadedness with hypotension: Yes Has patient had a PCN reaction causing severe rash involving mucus membranes or skin necrosis: No Has patient had a PCN reaction that required hospitalization No Has patient had a PCN reaction occurring within the last 10 years: No If all of the above answers are "NO", then may proceed with Cephalosporin use.    Sulfa Antibiotics Hives   Tylenol [Acetaminophen] Other (See Comments)    Pt reports he has had liver issues and was told not to take tylenol     Chief Complaint  Patient presents with   Medical Management of Chronic Issues           Vascular dementia without behavioral disturbance:      Aortic atherosclerosis    Essential hypertension:      HPI:  He is a 82 year old long term resident of this facility being seen for the management of his chronic illnesses:  Vascular dementia without behavioral disturbance:      Aortic atherosclerosis    Essential hypertension. There have been no exacerbation of his COPD. There are no reports of pain present. He will not wear dentures.   Past Medical History:  Diagnosis Date   Arthritis    Chronic rhinitis    Complication of anesthesia    patient usually hs to be intubated with anesthesia   COPD (chronic obstructive pulmonary disease) (HCC)    PFT 06-26-09 FEV1  1.75( 71%), FVC 3.65( 101%), FEV1% 48, TLC 5.53(104%), DLCO 55%, no BD   Dyspnea    Hypertension    Nasal septal deviation    Nocturia    On home O2    2L N/C   OSA (obstructive sleep apnea) 04/21/2011   Partial small  bowel obstruction (HCC) 10/18/2021   Skin cancer     Past Surgical History:  Procedure Laterality Date   APPENDECTOMY     BACK SURGERY     CATARACT EXTRACTION W/PHACO  01/06/2013   Procedure: CATARACT EXTRACTION PHACO AND INTRAOCULAR LENS PLACEMENT (IOC);  Surgeon: Gemma Payor, MD;  Location: [facility];  Service: Ophthalmology;  Laterality: Right;  CDE: 22.17   CATARACT EXTRACTION W/PHACO Left 06/20/2022   Procedure: CATARACT EXTRACTION PHACO AND INTRAOCULAR LENS PLACEMENT (IOC);  Surgeon: Fabio Pierce, MD;  Location: [facility];  Service: Ophthalmology;  Laterality: Left;  CDE 20.85   FEMORAL HERNIA REPAIR Right 11/19/2022   Procedure: HERNIA REPAIR FEMORAL WITH MESH;  Surgeon: Lucretia Roers, MD;  Location: [facility];  Service: General;  Laterality: Right;   INGUINAL HERNIA REPAIR Left 12/09/2021   Procedure: HERNIA REPAIR INGUINAL ADULT WITH MESH;  Surgeon: Lucretia Roers, MD;  Location: [facility];  Service: General;  Laterality: Left;   INGUINAL HERNIA REPAIR Right 03/19/2022   Procedure: HERNIA REPAIR INGUINAL ADULT WITH MESH;  Surgeon: Lucretia Roers, MD;  Location: [facility];  Service: General;  Laterality: Right;   ORIF PATELLA Left 01/22/2023   Procedure: OPEN REDUCTION INTERNAL FIXATION (ORIF) PATELLA;  Surgeon: [name], MD;  Location:  [facility];  Service: Orthopedics;  Laterality: Left;    Social History   Socioeconomic History   Marital status: Divorced    Spouse name: Not on file   Number of children: 3   Years of education: Not on file   Highest education level: Not on file  Occupational History   Occupation: Armed forces technical officer: RETIRED  Tobacco Use   Smoking status: Former    Current packs/day: 0.00    Average packs/day: 1.5 packs/day for 54.0 years (81.0 ttl pk-yrs)    Types: Cigarettes    Start date: 12/02/1947    Quit date: 12/01/2001    Years since quitting: 21.6   Smokeless tobacco: Never  Vaping Use   Vaping status: Never Used  Substance and Sexual Activity    Alcohol use: No   Drug use: No   Sexual activity: Not on file  Other Topics Concern   Not on file  Social History Narrative   Lives with sister.    Social Determinants of Health   Financial Resource Strain: Low Risk  (02/07/2022)   Overall Financial Resource Strain (CARDIA)    Difficulty of Paying Living Expenses: Not hard at all  Food Insecurity: Low Risk  (06/29/2023)   Received from [facility]   Food vital sign    Within the past 12 months, you worried that your food would run out before you got money to buy more: Never true    Within the past 12 months, the food you bought just didn't last and you didn't have money to get more. : Never true  Transportation Needs: Not on file (06/29/2023)  Physical Activity: Insufficiently Active (02/07/2022)   Exercise Vital Sign    Days of Exercise per Week: 3 days    Minutes of Exercise per Session: 20 min  Stress: No Stress Concern Present (02/07/2022)   [employer] of Occupational Health - Occupational Stress Questionnaire    Feeling of Stress : Not at all  Social Connections: Socially Isolated (02/07/2022)   Social Connection and Isolation Panel [NHANES]    Frequency of Communication with Friends and Family: More than three times a week    Frequency of Social Gatherings with Friends and Family: More than three times a week    Attends Religious Services: Never    Database administrator or Organizations: No    Attends Banker Meetings: Never    Marital Status: Divorced  Catering manager Violence: Not At Risk (01/20/2023)   Humiliation, Afraid, Rape, and Kick questionnaire    Fear of Current or Ex-Partner: No    Emotionally Abused: No    Physically Abused: No    Sexually Abused: No   Family History  Problem Relation Age of Onset   Lung cancer Mother       VITAL SIGNS BP 135/66   Pulse 61   Temp 98.5 F (36.9 C)   Resp 20   Ht 5\' 5"  (1.651 m)   Wt 138 lb 6.4 oz (62.8 kg)   SpO2 96%   BMI 23.03 kg/m    Outpatient Encounter Medications as of 07/13/2023  Medication Sig   albuterol (VENTOLIN HFA) 108 (90 Base) MCG/ACT inhaler Inhale 2 puffs into the lungs every 6 (six) hours as needed for wheezing or shortness of breath.   Albuterol Sulfate 2.5 MG/0.5ML NEBU Inhale 1 each into the lungs 3 (three) times daily.   ascorbic acid (VITAMIN C) 500 MG tablet Take 1,000 mg by mouth daily.  benzocaine-menthol (CHLORASEPTIC) 6-10 MG lozenge Take 1 lozenge by mouth every 4 (four) hours as needed for sore throat.   Budeson-Glycopyrrol-Formoterol (BREZTRI AEROSPHERE) 160-9-4.8 MCG/ACT AERO Inhale 2 puffs into the lungs in the morning and at bedtime.   cholecalciferol (VITAMIN D) 25 MCG (1000 UNIT) tablet Take 3,000 Units by mouth daily.   feeding supplement (ENSURE ENLIVE / ENSURE PLUS) LIQD Take 237 mLs by mouth 3 (three) times daily between meals.   ferrous sulfate 325 (65 FE) MG EC tablet Take 325 mg by mouth every Monday, Wednesday, and Friday.   finasteride (PROSCAR) 5 MG tablet Take 1 tablet (5 mg total) by mouth daily.   ipratropium-albuterol (DUONEB) 0.5-2.5 (3) MG/3ML SOLN Take 3 mLs by nebulization every 6 (six) hours as needed (wheezing, shortness of breath).   ipratropium-albuterol (DUONEB) 0.5-2.5 (3) MG/3ML SOLN Take 3 mLs by nebulization every 6 (six) hours.   losartan (COZAAR) 25 MG tablet Take 12.5 mg by mouth daily.   methocarbamol (ROBAXIN) 500 MG tablet Take 500 mg by mouth 3 (three) times daily.   Multiple Vitamin (MULTIVITAMIN) tablet Take 1 tablet by mouth daily. Men's health   OXYGEN Inhale 2 L into the lungs as directed. Every Shift; Day, Evening, Night   pantoprazole (PROTONIX) 40 MG tablet Take 1 tablet (40 mg total) by mouth daily.   pyridoxine (B-6) 100 MG tablet Take 100 mg by mouth daily.   tamsulosin (FLOMAX) 0.4 MG CAPS capsule Take 2 capsules (0.8 mg total) by mouth daily.   UNABLE TO FIND Diet: Dysphagia 3/Thin Liquids, Consistent carbohydrate   white petrolatum ointment  Apply topically once.   ZINC SULFATE PO Take 50 mg by mouth daily.   No facility-administered encounter medications on file as of 07/13/2023.     SIGNIFICANT DIAGNOSTIC EXAMS  LABS REVIEWED; PREVIOUS   01-20-23; wbc 7.9 hgb 15.0; hct 45.6; mcv 89.2 plt 246; glucose 118 bun 31; creat 0.96; k+ 4.2; na++ 140; ca 10.4; gfr >60; protein 7.0; albumin 3.9 01-23-23; wbc 13.0; hgb 11.3; hct 34.0; mcv 90.7 plt 166;glucose 149; bun 17; creat 0.84; k+ 3.9; na++ 134; ca 8.6; gfr >60; phos 2.4 albumin 2.7; mag 1.9; vitamin B12: 837; folate 15.5; iron 19; tibc 320; ferritin 72.  01-25-23: hgb A1c 6.8 01-27-23: wbc 11.0; hgb 11.2; hct 34.4; mcv 88.9 plt 215; glucose 169; bun 24; creat 0.72; k+ 3.4; na++ 138; ca 8.9; gfr >60; protein 5.6; albumin 2.5; mag 1.9 01-29-23; wbc 11.7; hgb 11.5; hct 35.8; mcv 90.4 plt 235; glucose 117; bun 32; creat 0.88; k+ 3.5; na++ 138; ca 8.6; gfr >60; protein 5.7 albumin 2.6 mag 2.1 03-12-23 wbc 7.8; hgb 12.3; hct 38.6; mcv 91.3 plt 289; glucose 114; bun 24; creat 0.89; k+ 4.0; na++ 135; ca 9.1 gfr >60; protein 6.6 albumin 3.1 tsh 3.506; vitamin B 12: 1393 rpr: nr   NO NEW LABS.    Review of Systems  Constitutional:  Negative for malaise/fatigue.  Respiratory:  Negative for cough and shortness of breath.   Cardiovascular:  Negative for chest pain, palpitations and leg swelling.  Gastrointestinal:  Negative for abdominal pain, constipation and heartburn.  Musculoskeletal:  Negative for back pain, joint pain and myalgias.  Skin: Negative.   Neurological:  Negative for dizziness.  Psychiatric/Behavioral:  The patient is not nervous/anxious.    Physical Exam Constitutional:      General: He is not in acute distress.    Appearance: He is well-developed. He is not diaphoretic.  Neck:     Thyroid:  No thyromegaly.  Cardiovascular:     Rate and Rhythm: Normal rate and regular rhythm.     Pulses: Normal pulses.     Heart sounds: Normal heart sounds.  Pulmonary:     Effort:  Pulmonary effort is normal. No respiratory distress.     Breath sounds: Normal breath sounds.     Comments: 02 Abdominal:     General: Bowel sounds are normal. There is no distension.     Palpations: Abdomen is soft.     Tenderness: There is no abdominal tenderness.  Musculoskeletal:        General: Normal range of motion.     Cervical back: Neck supple.     Right lower leg: No edema.     Left lower leg: No edema.  Lymphadenopathy:     Cervical: No cervical adenopathy.  Skin:    General: Skin is warm and dry.  Neurological:     Mental Status: He is alert and oriented to person, place, and time.  Psychiatric:        Mood and Affect: Mood normal.         ASSESSMENT/ PLAN:  TODAY  Vascular dementia without behavioral disturbance: weight is 138 pounds will monitor   2. Aortic atherosclerosis (ct 05-23-22)   3. Essential hypertension: b/p 135/66: is on asa 81 mg daily    PREVIOUS   4. Pulmonary emphysema unspecified emphysema/chronic respiratory failure with hypoxia : has 81 pack year history   will continue breztri aerosphere 160-9-4.8 mcg 2 puffs twice daily albuterol neb or 2 puffs every 6 hours as needed.   5. Gastroesophageal reflux disease without esophagitis: will continue protonix 40 mg daily   7. BPH with obstruction/urinary symptoms: will continue proscar 5 mg daily and flomax 0.8 mg daily   8. Protein calorie malnutrition severe; protein 5.6 albumin 2.5 will continue ensure three times daily and prostat three times daily   9. Chronic constipation: will continue miralax daily   10. Iron deficiency anemia: hgb 12.3 iron 19; will continue iron three times weekly; is status post iron infusion.   11. Type 2 diabetes mellitus with hyperglycemia without long term current use of insulin: steroid inducedhgb A1c 6.8; will monitor     Synthia Innocent NP [facility]  call [phone]

## 2023-08-06 DIAGNOSIS — Z9981 Dependence on supplemental oxygen: Secondary | ICD-10-CM | POA: Diagnosis not present

## 2023-08-06 DIAGNOSIS — J3489 Other specified disorders of nose and nasal sinuses: Secondary | ICD-10-CM | POA: Diagnosis not present

## 2023-08-27 ENCOUNTER — Non-Acute Institutional Stay (SKILLED_NURSING_FACILITY): Payer: 59 | Admitting: Adult Health

## 2023-08-27 ENCOUNTER — Encounter: Payer: Self-pay | Admitting: Adult Health

## 2023-08-27 DIAGNOSIS — N401 Enlarged prostate with lower urinary tract symptoms: Secondary | ICD-10-CM | POA: Diagnosis not present

## 2023-08-27 DIAGNOSIS — J439 Emphysema, unspecified: Secondary | ICD-10-CM | POA: Diagnosis not present

## 2023-08-27 DIAGNOSIS — N138 Other obstructive and reflux uropathy: Secondary | ICD-10-CM

## 2023-08-27 DIAGNOSIS — J9611 Chronic respiratory failure with hypoxia: Secondary | ICD-10-CM | POA: Diagnosis not present

## 2023-08-27 DIAGNOSIS — K219 Gastro-esophageal reflux disease without esophagitis: Secondary | ICD-10-CM | POA: Diagnosis not present

## 2023-08-27 NOTE — Progress Notes (Signed)
Location:  [facility] Room Number: 107 Place of Service:  SNF (31)   CODE STATUS: full   Allergies  Allergen Reactions   Benzyl Alcohol Itching   Amlodipine Swelling    Swelling in feet   Aspirin Other (See Comments)    Upset stomach.   Other Hypertension    Hazelnuts   Penicillins Hives    Has patient had a PCN reaction causing immediate rash, facial/tongue/throat swelling, SOB or lightheadedness with hypotension: Yes Has patient had a PCN reaction causing severe rash involving mucus membranes or skin necrosis: No Has patient had a PCN reaction that required hospitalization No Has patient had a PCN reaction occurring within the last 10 years: No If all of the above answers are "NO", then may proceed with Cephalosporin use.    Sulfa Antibiotics Hives   Tylenol [Acetaminophen] Other (See Comments)    Pt reports he has had liver issues and was told not to take tylenol     Chief Complaint  Patient presents with   Medical Management of Chronic Issues           Pulmonary emphysema unspecified emphysema / chronic respiratory failure with hypoxia:      Gastroesophageal reflux disease without esophagitis:      BPH with obstruction/urinary symptoms:     HPI:  He is a 82 year old long term resident of this facility being seen for the management of his chronic illnesses; Pulmonary emphysema unspecified emphysema / chronic respiratory failure with hypoxia:      Gastroesophageal reflux disease without esophagitis:      BPH with obstruction/urinary symptoms. There are no reports of uncontrolled pain. He continues to ambulate daily. He denies any worsening shortness of breath or cough present.   Past Medical History:  Diagnosis Date   Arthritis    Chronic rhinitis    Complication of anesthesia    patient usually hs to be intubated with anesthesia   COPD (chronic obstructive pulmonary disease) (HCC)    PFT 06-26-09 FEV1  1.75( 71%), FVC 3.65( 101%), FEV1% 48,  TLC 5.53(104%), DLCO 55%, no BD   Dyspnea    Hypertension    Nasal septal deviation    Nocturia    On home O2    2L N/C   OSA (obstructive sleep apnea) 04/21/2011   Partial small bowel obstruction (HCC) 10/18/2021   Skin cancer     Past Surgical History:  Procedure Laterality Date   APPENDECTOMY     BACK SURGERY     CATARACT EXTRACTION W/PHACO  01/06/2013   Procedure: CATARACT EXTRACTION PHACO AND INTRAOCULAR LENS PLACEMENT (IOC);  Surgeon: Gemma Payor, MD;  Location: [facility];  Service: Ophthalmology;  Laterality: Right;  CDE: 22.17   CATARACT EXTRACTION W/PHACO Left 06/20/2022   Procedure: CATARACT EXTRACTION PHACO AND INTRAOCULAR LENS PLACEMENT (IOC);  Surgeon: Fabio Pierce, MD;  Location: [facility];  Service: Ophthalmology;  Laterality: Left;  CDE 20.85   FEMORAL HERNIA REPAIR Right 11/19/2022   Procedure: HERNIA REPAIR FEMORAL WITH MESH;  Surgeon: Lucretia Roers, MD;  Location: [facility];  Service: General;  Laterality: Right;   INGUINAL HERNIA REPAIR Left 12/09/2021   Procedure: HERNIA REPAIR INGUINAL ADULT WITH MESH;  Surgeon: Lucretia Roers, MD;  Location: [facility];  Service: General;  Laterality: Left;   INGUINAL HERNIA REPAIR Right 03/19/2022   Procedure: HERNIA REPAIR INGUINAL ADULT WITH MESH;  Surgeon: Lucretia Roers, MD;  Location: [facility];  Service: General;  Laterality:  Right;   ORIF PATELLA Left 01/22/2023   Procedure: OPEN REDUCTION INTERNAL FIXATION (ORIF) PATELLA;  Surgeon: [name], MD;  Location: [facility];  Service: Orthopedics;  Laterality: Left;    Social History   Socioeconomic History   Marital status: Divorced    Spouse name: Not on file   Number of children: 3   Years of education: Not on file   Highest education level: Not on file  Occupational History   Occupation: Armed forces technical officer: RETIRED  Tobacco Use   Smoking status: Former    Current packs/day: 0.00    Average packs/day: 1.5 packs/day for 54.0 years (81.0 ttl pk-yrs)    Types:  Cigarettes    Start date: 12/02/1947    Quit date: 12/01/2001    Years since quitting: 21.7   Smokeless tobacco: Never  Vaping Use   Vaping status: Never Used  Substance and Sexual Activity   Alcohol use: No   Drug use: No   Sexual activity: Not on file  Other Topics Concern   Not on file  Social History Narrative   Lives with sister.    Social Determinants of Health   Financial Resource Strain: Low Risk  (02/07/2022)   Overall Financial Resource Strain (CARDIA)    Difficulty of Paying Living Expenses: Not hard at all  Food Insecurity: Low Risk  (08/06/2023)   Received from [facility]   Hunger Vital Sign    Worried About Running Out of Food in the Last Year: Never true    Ran Out of Food in the Last Year: Never true  Transportation Needs: No Transportation Needs (08/06/2023)   Received from [facility]    In the past 12 months, has lack of reliable transportation kept you from medical appointments, meetings, work or from getting things needed for daily living? : No  Physical Activity: Insufficiently Active (02/07/2022)   Exercise Vital Sign    Days of Exercise per Week: 3 days    Minutes of Exercise per Session: 20 min  Stress: No Stress Concern Present (02/07/2022)   [employer] of Occupational Health - Occupational Stress Questionnaire    Feeling of Stress : Not at all  Social Connections: Socially Isolated (02/07/2022)   Social Connection and Isolation Panel [NHANES]    Frequency of Communication with Friends and Family: More than three times a week    Frequency of Social Gatherings with Friends and Family: More than three times a week    Attends Religious Services: Never    Database administrator or Organizations: No    Attends Banker Meetings: Never    Marital Status: Divorced  Catering manager Violence: Not At Risk (01/20/2023)   Humiliation, Afraid, Rape, and Kick questionnaire    Fear of Current or Ex-Partner: No    Emotionally  Abused: No    Physically Abused: No    Sexually Abused: No   Family History  Problem Relation Age of Onset   Lung cancer Mother       VITAL SIGNS BP 117/63   Pulse 72   Temp 98.9 F (37.2 C)   Resp (!) 22   Ht 5\' 5"  (1.651 m)   Wt 142 lb (64.4 kg)   SpO2 94%   BMI 23.63 kg/m   Outpatient Encounter Medications as of 08/27/2023  Medication Sig   albuterol (VENTOLIN HFA) 108 (90 Base) MCG/ACT inhaler Inhale 2 puffs into the lungs every 6 (six) hours as  needed for wheezing or shortness of breath.   Albuterol Sulfate 2.5 MG/0.5ML NEBU Inhale 1 each into the lungs 3 (three) times daily.   ascorbic acid (VITAMIN C) 500 MG tablet Take 1,000 mg by mouth daily.   benzocaine-menthol (CHLORASEPTIC) 6-10 MG lozenge Take 1 lozenge by mouth every 4 (four) hours as needed for sore throat.   Budeson-Glycopyrrol-Formoterol (BREZTRI AEROSPHERE) 160-9-4.8 MCG/ACT AERO Inhale 2 puffs into the lungs in the morning and at bedtime.   cholecalciferol (VITAMIN D) 25 MCG (1000 UNIT) tablet Take 3,000 Units by mouth daily.   feeding supplement (ENSURE ENLIVE / ENSURE PLUS) LIQD Take 237 mLs by mouth 3 (three) times daily between meals.   ferrous sulfate 325 (65 FE) MG EC tablet Take 325 mg by mouth every Monday, Wednesday, and Friday.   finasteride (PROSCAR) 5 MG tablet Take 1 tablet (5 mg total) by mouth daily.   ipratropium-albuterol (DUONEB) 0.5-2.5 (3) MG/3ML SOLN Take 3 mLs by nebulization every 6 (six) hours as needed (wheezing, shortness of breath).   ipratropium-albuterol (DUONEB) 0.5-2.5 (3) MG/3ML SOLN Take 3 mLs by nebulization every 6 (six) hours.   losartan (COZAAR) 25 MG tablet Take 12.5 mg by mouth daily.   methocarbamol (ROBAXIN) 500 MG tablet Take 500 mg by mouth 3 (three) times daily.   Multiple Vitamin (MULTIVITAMIN) tablet Take 1 tablet by mouth daily. Men's health   OXYGEN Inhale 2 L into the lungs as directed. Every Shift; Day, Evening, Night   pantoprazole (PROTONIX) 40 MG tablet  Take 1 tablet (40 mg total) by mouth daily.   pyridoxine (B-6) 100 MG tablet Take 100 mg by mouth daily.   tamsulosin (FLOMAX) 0.4 MG CAPS capsule Take 2 capsules (0.8 mg total) by mouth daily.   UNABLE TO FIND Diet: Dysphagia 3/Thin Liquids, Consistent carbohydrate   white petrolatum ointment Apply topically once.   ZINC SULFATE PO Take 50 mg by mouth daily.   No facility-administered encounter medications on file as of 08/27/2023.     SIGNIFICANT DIAGNOSTIC EXAMS  LABS REVIEWED; PREVIOUS   01-20-23; wbc 7.9 hgb 15.0; hct 45.6; mcv 89.2 plt 246; glucose 118 bun 31; creat 0.96; k+ 4.2; na++ 140; ca 10.4; gfr >60; protein 7.0; albumin 3.9 01-23-23; wbc 13.0; hgb 11.3; hct 34.0; mcv 90.7 plt 166;glucose 149; bun 17; creat 0.84; k+ 3.9; na++ 134; ca 8.6; gfr >60; phos 2.4 albumin 2.7; mag 1.9; vitamin B12: 837; folate 15.5; iron 19; tibc 320; ferritin 72.  01-25-23: hgb A1c 6.8 01-27-23: wbc 11.0; hgb 11.2; hct 34.4; mcv 88.9 plt 215; glucose 169; bun 24; creat 0.72; k+ 3.4; na++ 138; ca 8.9; gfr >60; protein 5.6; albumin 2.5; mag 1.9 01-29-23; wbc 11.7; hgb 11.5; hct 35.8; mcv 90.4 plt 235; glucose 117; bun 32; creat 0.88; k+ 3.5; na++ 138; ca 8.6; gfr >60; protein 5.7 albumin 2.6 mag 2.1 03-12-23 wbc 7.8; hgb 12.3; hct 38.6; mcv 91.3 plt 289; glucose 114; bun 24; creat 0.89; k+ 4.0; na++ 135; ca 9.1 gfr >60; protein 6.6 albumin 3.1 tsh 3.506; vitamin B 12: 1393 rpr: nr   NO NEW LABS.    Review of Systems  Constitutional:  Negative for malaise/fatigue.  Respiratory:  Negative for cough and shortness of breath.   Cardiovascular:  Negative for chest pain, palpitations and leg swelling.  Gastrointestinal:  Negative for abdominal pain, constipation and heartburn.  Musculoskeletal:  Negative for back pain, joint pain and myalgias.  Skin: Negative.   Neurological:  Negative for dizziness.  Psychiatric/Behavioral:  The patient is not nervous/anxious.    Physical Exam Constitutional:      General:  He is not in acute distress.    Appearance: He is well-developed. He is not diaphoretic.  Neck:     Thyroid: No thyromegaly.  Cardiovascular:     Rate and Rhythm: Normal rate and regular rhythm.     Pulses: Normal pulses.     Heart sounds: Normal heart sounds.  Pulmonary:     Effort: Pulmonary effort is normal. No respiratory distress.     Breath sounds: Normal breath sounds.     Comments: 02 Abdominal:     General: Bowel sounds are normal. There is no distension.     Palpations: Abdomen is soft.     Tenderness: There is no abdominal tenderness.  Musculoskeletal:        General: Normal range of motion.     Cervical back: Neck supple.     Right lower leg: No edema.     Left lower leg: No edema.  Lymphadenopathy:     Cervical: No cervical adenopathy.  Skin:    General: Skin is warm and dry.  Neurological:     Mental Status: He is alert and oriented to person, place, and time.  Psychiatric:        Mood and Affect: Mood normal.          ASSESSMENT/ PLAN:  TODAY  Pulmonary emphysema unspecified emphysema / chronic respiratory failure with hypoxia: has 81 pack year history. Will continue breztri 160-9-4.8  mcg 2 puffs twice daily albuterol neb or 2 puffs every 6 hours as needed  2. Gastroesophageal reflux disease without esophagitis: will continue protonix 40 mg daily   3. BPH with obstruction/urinary symptoms: will continue proscar 5mg  daily and flomax 0.8 mg daily     PREVIOUS   4. Protein calorie malnutrition severe; protein 5.6 albumin 2.5 will continue ensure three times daily and prostat three times daily   5. Chronic constipation: will continue miralax daily   6. Iron deficiency anemia: hgb 12.3 iron 19; will continue iron three times weekly; is status post iron infusion.   7. Type 2 diabetes mellitus with hyperglycemia without long term current use of insulin: steroid induced hgb A1c 6.8; will monitor   8. Vascular dementia without behavioral disturbance:  weight is 142 pounds will monitor   9. Aortic atherosclerosis (ct 05-23-22)   10. Essential hypertension: b/p 117/63: is on asa 81 mg daily     Synthia Innocent NP [facility]  call [phone]

## 2023-09-08 DIAGNOSIS — Z23 Encounter for immunization: Secondary | ICD-10-CM | POA: Diagnosis not present

## 2023-09-21 DIAGNOSIS — Z23 Encounter for immunization: Secondary | ICD-10-CM | POA: Diagnosis not present

## 2023-09-24 ENCOUNTER — Encounter: Payer: Self-pay | Admitting: Adult Health

## 2023-09-24 ENCOUNTER — Non-Acute Institutional Stay (SKILLED_NURSING_FACILITY): Payer: 59 | Admitting: Adult Health

## 2023-09-24 DIAGNOSIS — K5909 Other constipation: Secondary | ICD-10-CM

## 2023-09-24 DIAGNOSIS — E43 Unspecified severe protein-calorie malnutrition: Secondary | ICD-10-CM | POA: Diagnosis not present

## 2023-09-24 DIAGNOSIS — D509 Iron deficiency anemia, unspecified: Secondary | ICD-10-CM | POA: Diagnosis not present

## 2023-09-24 NOTE — Progress Notes (Signed)
Location:  [facility] Room Number: 107 Place of Service:  SNF (31)   CODE STATUS: dnr   Allergies  Allergen Reactions   Benzyl Alcohol Itching   Amlodipine Swelling    Swelling in feet   Aspirin Other (See Comments)    Upset stomach.   Other Hypertension    Hazelnuts   Penicillins Hives    Has patient had a PCN reaction causing immediate rash, facial/tongue/throat swelling, SOB or lightheadedness with hypotension: Yes Has patient had a PCN reaction causing severe rash involving mucus membranes or skin necrosis: No Has patient had a PCN reaction that required hospitalization No Has patient had a PCN reaction occurring within the last 10 years: No If all of the above answers are "NO", then may proceed with Cephalosporin use.    Sulfa Antibiotics Hives   Tylenol [Acetaminophen] Other (See Comments)    Pt reports he has had liver issues and was told not to take tylenol     Chief Complaint  Patient presents with   Medical Management of Chronic Issues       Protein calorie malnutrition, severe:     Chronic constipation:   Iron deficiency anemia     HPI:  He is a 82 year old long term resident of this facility being seen for the management of his chronic illnesses:  Protein calorie malnutrition, severe:     Chronic constipation:   Iron deficiency anemia. There are no reports of uncontrolled pain. There are no reports of worsening shortness of breath or sputum production.   Past Medical History:  Diagnosis Date   Arthritis    Chronic rhinitis    Complication of anesthesia    patient usually hs to be intubated with anesthesia   COPD (chronic obstructive pulmonary disease) (HCC)    PFT 06-26-09 FEV1  1.75( 71%), FVC 3.65( 101%), FEV1% 48, TLC 5.53(104%), DLCO 55%, no BD   Dyspnea    Hypertension    Nasal septal deviation    Nocturia    On home O2    2L N/C   OSA (obstructive sleep apnea) 04/21/2011   Partial small bowel obstruction (HCC)  10/18/2021   Skin cancer     Past Surgical History:  Procedure Laterality Date   APPENDECTOMY     BACK SURGERY     CATARACT EXTRACTION W/PHACO  01/06/2013   Procedure: CATARACT EXTRACTION PHACO AND INTRAOCULAR LENS PLACEMENT (IOC);  Surgeon: Gemma Payor, MD;  Location: [facility];  Service: Ophthalmology;  Laterality: Right;  CDE: 22.17   CATARACT EXTRACTION W/PHACO Left 06/20/2022   Procedure: CATARACT EXTRACTION PHACO AND INTRAOCULAR LENS PLACEMENT (IOC);  Surgeon: Fabio Pierce, MD;  Location: [facility];  Service: Ophthalmology;  Laterality: Left;  CDE 20.85   FEMORAL HERNIA REPAIR Right 11/19/2022   Procedure: HERNIA REPAIR FEMORAL WITH MESH;  Surgeon: Lucretia Roers, MD;  Location: [facility];  Service: General;  Laterality: Right;   INGUINAL HERNIA REPAIR Left 12/09/2021   Procedure: HERNIA REPAIR INGUINAL ADULT WITH MESH;  Surgeon: Lucretia Roers, MD;  Location: [facility];  Service: General;  Laterality: Left;   INGUINAL HERNIA REPAIR Right 03/19/2022   Procedure: HERNIA REPAIR INGUINAL ADULT WITH MESH;  Surgeon: Lucretia Roers, MD;  Location: [facility];  Service: General;  Laterality: Right;   ORIF PATELLA Left 01/22/2023   Procedure: OPEN REDUCTION INTERNAL FIXATION (ORIF) PATELLA;  Surgeon: [name], MD;  Location: [facility];  Service: Orthopedics;  Laterality: Left;  Social History   Socioeconomic History   Marital status: Divorced    Spouse name: Not on file   Number of children: 3   Years of education: Not on file   Highest education level: Not on file  Occupational History   Occupation: Armed forces technical officer: RETIRED  Tobacco Use   Smoking status: Former    Current packs/day: 0.00    Average packs/day: 1.5 packs/day for 54.0 years (81.0 ttl pk-yrs)    Types: Cigarettes    Start date: 12/02/1947    Quit date: 12/01/2001    Years since quitting: 21.8   Smokeless tobacco: Never  Vaping Use   Vaping status: Never Used  Substance and Sexual Activity   Alcohol use: No    Drug use: No   Sexual activity: Not on file  Other Topics Concern   Not on file  Social History Narrative   Lives with sister.    Social Determinants of Health   Financial Resource Strain: Low Risk  (02/07/2022)   Overall Financial Resource Strain (CARDIA)    Difficulty of Paying Living Expenses: Not hard at all  Food Insecurity: Low Risk  (08/06/2023)   Received from [facility]   Hunger Vital Sign    Worried About Running Out of Food in the Last Year: Never true    Ran Out of Food in the Last Year: Never true  Transportation Needs: No Transportation Needs (08/06/2023)   Received from [facility]    In the past 12 months, has lack of reliable transportation kept you from medical appointments, meetings, work or from getting things needed for daily living? : No  Physical Activity: Insufficiently Active (02/07/2022)   Exercise Vital Sign    Days of Exercise per Week: 3 days    Minutes of Exercise per Session: 20 min  Stress: No Stress Concern Present (02/07/2022)   [employer] of Occupational Health - Occupational Stress Questionnaire    Feeling of Stress : Not at all  Social Connections: Socially Isolated (02/07/2022)   Social Connection and Isolation Panel [NHANES]    Frequency of Communication with Friends and Family: More than three times a week    Frequency of Social Gatherings with Friends and Family: More than three times a week    Attends Religious Services: Never    Database administrator or Organizations: No    Attends Banker Meetings: Never    Marital Status: Divorced  Catering manager Violence: Not At Risk (01/20/2023)   Humiliation, Afraid, Rape, and Kick questionnaire    Fear of Current or Ex-Partner: No    Emotionally Abused: No    Physically Abused: No    Sexually Abused: No   Family History  Problem Relation Age of Onset   Lung cancer Mother       VITAL SIGNS BP 128/71   Pulse 72   Temp (!) 97.5 F (36.4 C)   Resp  20   Ht 5\' 5"  (1.651 m)   Wt 145 lb 3.2 oz (65.9 kg)   SpO2 96%   BMI 24.16 kg/m   Outpatient Encounter Medications as of 09/24/2023  Medication Sig   albuterol (VENTOLIN HFA) 108 (90 Base) MCG/ACT inhaler Inhale 2 puffs into the lungs every 6 (six) hours as needed for wheezing or shortness of breath.   Albuterol Sulfate 2.5 MG/0.5ML NEBU Inhale 1 each into the lungs 3 (three) times daily.   ascorbic acid (VITAMIN C) 500 MG tablet  Take 1,000 mg by mouth daily.   benzocaine-menthol (CHLORASEPTIC) 6-10 MG lozenge Take 1 lozenge by mouth every 4 (four) hours as needed for sore throat.   Budeson-Glycopyrrol-Formoterol (BREZTRI AEROSPHERE) 160-9-4.8 MCG/ACT AERO Inhale 2 puffs into the lungs in the morning and at bedtime.   cholecalciferol (VITAMIN D) 25 MCG (1000 UNIT) tablet Take 3,000 Units by mouth daily.   feeding supplement (ENSURE ENLIVE / ENSURE PLUS) LIQD Take 237 mLs by mouth 3 (three) times daily between meals.   ferrous sulfate 325 (65 FE) MG EC tablet Take 325 mg by mouth every Monday, Wednesday, and Friday.   finasteride (PROSCAR) 5 MG tablet Take 1 tablet (5 mg total) by mouth daily.   ipratropium-albuterol (DUONEB) 0.5-2.5 (3) MG/3ML SOLN Take 3 mLs by nebulization every 6 (six) hours as needed (wheezing, shortness of breath).   ipratropium-albuterol (DUONEB) 0.5-2.5 (3) MG/3ML SOLN Take 3 mLs by nebulization every 6 (six) hours.   losartan (COZAAR) 25 MG tablet Take 12.5 mg by mouth daily.   methocarbamol (ROBAXIN) 500 MG tablet Take 500 mg by mouth 3 (three) times daily.   OXYGEN Inhale 2 L into the lungs as directed. Every Shift; Day, Evening, Night   pyridoxine (B-6) 100 MG tablet Take 100 mg by mouth daily.   tamsulosin (FLOMAX) 0.4 MG CAPS capsule Take 2 capsules (0.8 mg total) by mouth daily.   UNABLE TO FIND Diet: Dysphagia 2/Thin Liquids, Consistent carbohydrate   white petrolatum ointment Apply topically once.   ZINC SULFATE PO Take 50 mg by mouth daily.    [DISCONTINUED] Multiple Vitamin (MULTIVITAMIN) tablet Take 1 tablet by mouth daily. Men's health   [DISCONTINUED] pantoprazole (PROTONIX) 40 MG tablet Take 1 tablet (40 mg total) by mouth daily.   No facility-administered encounter medications on file as of 09/24/2023.     SIGNIFICANT DIAGNOSTIC EXAMS  LABS REVIEWED; PREVIOUS   01-20-23; wbc 7.9 hgb 15.0; hct 45.6; mcv 89.2 plt 246; glucose 118 bun 31; creat 0.96; k+ 4.2; na++ 140; ca 10.4; gfr >60; protein 7.0; albumin 3.9 01-23-23; wbc 13.0; hgb 11.3; hct 34.0; mcv 90.7 plt 166;glucose 149; bun 17; creat 0.84; k+ 3.9; na++ 134; ca 8.6; gfr >60; phos 2.4 albumin 2.7; mag 1.9; vitamin B12: 837; folate 15.5; iron 19; tibc 320; ferritin 72.  01-25-23: hgb A1c 6.8 01-27-23: wbc 11.0; hgb 11.2; hct 34.4; mcv 88.9 plt 215; glucose 169; bun 24; creat 0.72; k+ 3.4; na++ 138; ca 8.9; gfr >60; protein 5.6; albumin 2.5; mag 1.9 01-29-23; wbc 11.7; hgb 11.5; hct 35.8; mcv 90.4 plt 235; glucose 117; bun 32; creat 0.88; k+ 3.5; na++ 138; ca 8.6; gfr >60; protein 5.7 albumin 2.6 mag 2.1 03-12-23 wbc 7.8; hgb 12.3; hct 38.6; mcv 91.3 plt 289; glucose 114; bun 24; creat 0.89; k+ 4.0; na++ 135; ca 9.1 gfr >60; protein 6.6 albumin 3.1 tsh 3.506; vitamin B 12: 1393 rpr: nr   NO NEW LABS.    Review of Systems  Constitutional:  Negative for malaise/fatigue.  Respiratory:  Negative for cough and shortness of breath.   Cardiovascular:  Negative for chest pain, palpitations and leg swelling.  Gastrointestinal:  Negative for abdominal pain, constipation and heartburn.  Musculoskeletal:  Negative for back pain, joint pain and myalgias.  Skin: Negative.   Neurological:  Negative for dizziness.  Psychiatric/Behavioral:  The patient is not nervous/anxious.    Physical Exam Constitutional:      General: He is not in acute distress.    Appearance: He is well-developed. He  is not diaphoretic.  Neck:     Thyroid: No thyromegaly.  Cardiovascular:     Rate and Rhythm:  Normal rate and regular rhythm.     Pulses: Normal pulses.     Heart sounds: Normal heart sounds.  Pulmonary:     Effort: Pulmonary effort is normal. No respiratory distress.     Breath sounds: Normal breath sounds.     Comments: 02 Abdominal:     General: Bowel sounds are normal. There is no distension.     Palpations: Abdomen is soft.     Tenderness: There is no abdominal tenderness.  Musculoskeletal:        General: Normal range of motion.     Cervical back: Neck supple.     Right lower leg: No edema.     Left lower leg: No edema.  Lymphadenopathy:     Cervical: No cervical adenopathy.  Skin:    General: Skin is warm and dry.  Neurological:     Mental Status: He is alert. Mental status is at baseline.     Comments: 02-18-23: SLUMS: 15/30  Psychiatric:        Mood and Affect: Mood normal.           ASSESSMENT/ PLAN:  TODAY  Protein calorie malnutrition, severe: protein 5.6 albumin 2.5 will continue ensure three times daily   2. Chronic constipation: will continue miralax daily   3. Iron deficiency anemia: hgb 12.3; iron 19; will continue iron three times weekly;   PREVIOUS   4. Type 2 diabetes mellitus with hyperglycemia without long term current use of insulin: steroid induced hgb A1c 6.8; will monitor   5. Vascular dementia without behavioral disturbance: weight is 145 pounds will monitor   6. Aortic atherosclerosis (ct 05-23-22)   7. Essential hypertension: b/p 128/71: is on asa 81 mg daily   8. Pulmonary emphysema unspecified emphysema / chronic respiratory failure with hypoxia: has 81 pack year history. Will continue breztri 160-9-4.8  mcg 2 puffs twice daily albuterol neb or 2 puffs every 6 hours as needed  9. Gastroesophageal reflux disease without esophagitis: will stop protonix   10. BPH with obstruction/urinary symptoms: will continue proscar 5mg  daily and flomax 0.8 mg daily     Will check cbc; cmp; iron vitamin D; tsh; hgb A1c; ACR   Synthia Innocent  NP [facility] Adult Medicine   call [phone]

## 2023-09-28 ENCOUNTER — Other Ambulatory Visit (HOSPITAL_COMMUNITY)
Admission: RE | Admit: 2023-09-28 | Discharge: 2023-09-28 | Disposition: A | Discharge status: Home / Self Care | Payer: 59 | Source: Skilled Nursing Facility | Attending: Internal Medicine | Admitting: Internal Medicine

## 2023-09-28 ENCOUNTER — Encounter: Payer: Self-pay | Admitting: Adult Health

## 2023-09-28 ENCOUNTER — Non-Acute Institutional Stay (SKILLED_NURSING_FACILITY): Payer: 59 | Admitting: Adult Health

## 2023-09-28 DIAGNOSIS — E785 Hyperlipidemia, unspecified: Secondary | ICD-10-CM | POA: Insufficient documentation

## 2023-09-28 DIAGNOSIS — S82042A Displaced comminuted fracture of left patella, initial encounter for closed fracture: Secondary | ICD-10-CM | POA: Insufficient documentation

## 2023-09-28 DIAGNOSIS — E039 Hypothyroidism, unspecified: Secondary | ICD-10-CM

## 2023-09-28 DIAGNOSIS — E1169 Type 2 diabetes mellitus with other specified complication: Secondary | ICD-10-CM | POA: Diagnosis not present

## 2023-09-28 DIAGNOSIS — I152 Hypertension secondary to endocrine disorders: Secondary | ICD-10-CM | POA: Diagnosis not present

## 2023-09-28 DIAGNOSIS — E1159 Type 2 diabetes mellitus with other circulatory complications: Secondary | ICD-10-CM | POA: Insufficient documentation

## 2023-09-28 DIAGNOSIS — D509 Iron deficiency anemia, unspecified: Secondary | ICD-10-CM | POA: Insufficient documentation

## 2023-09-28 DIAGNOSIS — N401 Enlarged prostate with lower urinary tract symptoms: Secondary | ICD-10-CM | POA: Diagnosis not present

## 2023-09-28 DIAGNOSIS — I10 Essential (primary) hypertension: Secondary | ICD-10-CM | POA: Diagnosis not present

## 2023-09-28 LAB — LIPID PANEL
Cholesterol: 258 mg/dL — ABNORMAL HIGH (ref 0–200)
HDL: 51 mg/dL (ref >40)
LDL Cholesterol: 174 mg/dL — ABNORMAL HIGH (ref 0–99)
Total CHOL/HDL Ratio: 5.1 {ratio}
Triglycerides: 163 mg/dL — ABNORMAL HIGH (ref <150)
VLDL: 33 mg/dL (ref 0–40)

## 2023-09-28 LAB — COMPREHENSIVE METABOLIC PANEL
ALT: 27 U/L (ref 0–44)
AST: 22 U/L (ref 15–41)
Albumin: 3.7 g/dL (ref 3.5–5.0)
Alkaline Phosphatase: 56 U/L (ref 38–126)
Anion gap: 15 (ref 5–15)
BUN: 28 mg/dL — ABNORMAL HIGH (ref 8–23)
CO2: 23 mmol/L (ref 22–32)
Calcium: 9.6 mg/dL (ref 8.9–10.3)
Chloride: 99 mmol/L (ref 98–111)
Creatinine, Ser: 0.83 mg/dL (ref 0.61–1.24)
GFR, Estimated: >60 mL/min (ref >60)
Glucose, Bld: 141 mg/dL — ABNORMAL HIGH (ref 70–99)
Potassium: 3.9 mmol/L (ref 3.5–5.1)
Sodium: 137 mmol/L (ref 135–145)
Total Bilirubin: 0.7 mg/dL (ref 0.3–1.2)
Total Protein: 7.1 g/dL (ref 6.5–8.1)

## 2023-09-28 LAB — IRON AND TIBC
Iron: 93 ug/dL (ref 45–182)
Saturation Ratios: 21 % (ref 17.9–39.5)
TIBC: 436 ug/dL (ref 250–450)
UIBC: 343 ug/dL

## 2023-09-28 LAB — CBC
HCT: 50.4 % (ref 39.0–52.0)
Hemoglobin: 16 g/dL (ref 13.0–17.0)
MCH: 30.2 pg (ref 26.0–34.0)
MCHC: 31.7 g/dL (ref 30.0–36.0)
MCV: 95.1 fL (ref 80.0–100.0)
Platelets: 251 10*3/uL (ref 150–400)
RBC: 5.3 MIL/uL (ref 4.22–5.81)
RDW: 13.8 % (ref 11.5–15.5)
WBC: 9.6 10*3/uL (ref 4.0–10.5)
nRBC: 0 % (ref 0.0–0.2)

## 2023-09-28 LAB — HEMOGLOBIN A1C
Hgb A1c MFr Bld: 6.9 % — ABNORMAL HIGH (ref 4.8–5.6)
Mean Plasma Glucose: 151.33 mg/dL

## 2023-09-28 LAB — PSA: Prostatic Specific Antigen: 0.6 ng/mL (ref 0.00–4.00)

## 2023-09-28 LAB — TSH: TSH: 6.055 u[IU]/mL — ABNORMAL HIGH (ref 0.350–4.500)

## 2023-09-28 NOTE — Progress Notes (Unsigned)
Location:  [facility] Room Number: [id number] Place of Service:  SNF (31)   CODE STATUS: dnr   Allergies  Allergen Reactions   Benzyl Alcohol Itching   Amlodipine Swelling    Swelling in feet   Aspirin Other (See Comments)    Upset stomach.   Other Hypertension    Hazelnuts   Penicillins Hives    Has patient had a PCN reaction causing immediate rash, facial/tongue/throat swelling, SOB or lightheadedness with hypotension: Yes Has patient had a PCN reaction causing severe rash involving mucus membranes or skin necrosis: No Has patient had a PCN reaction that required hospitalization No Has patient had a PCN reaction occurring within the last 10 years: No If all of the above answers are "NO", then may proceed with Cephalosporin use.    Sulfa Antibiotics Hives   Tylenol [Acetaminophen] Other (See Comments)    Pt reports he has had liver issues and was told not to take tylenol     Chief Complaint  Patient presents with   Acute Visit    Follow up labs     HPI:  He has an elevated tsh of 6.055. he is presently not taking any medication for hypothyroidism. He denies any significant change in energy; no lethargy. His cholesterol level are elevated; however due to his advanced age; there is no indication to start therapy.   Past Medical History:  Diagnosis Date   Arthritis    Chronic rhinitis    Complication of anesthesia    patient usually hs to be intubated with anesthesia   COPD (chronic obstructive pulmonary disease) (HCC)    PFT 06-26-09 FEV1  1.75( 71%), FVC 3.65( 101%), FEV1% 48, TLC 5.53(104%), DLCO 55%, no BD   Dyspnea    Hypertension    Nasal septal deviation    Nocturia    On home O2    2L N/C   OSA (obstructive sleep apnea) 04/21/2011   Partial small bowel obstruction (HCC) 10/18/2021   Skin cancer     Past Surgical History:  Procedure Laterality Date   APPENDECTOMY     BACK SURGERY     CATARACT EXTRACTION W/PHACO  01/06/2013    Procedure: CATARACT EXTRACTION PHACO AND INTRAOCULAR LENS PLACEMENT (IOC);  Surgeon: Gemma Payor, MD;  Location: [facility];  Service: Ophthalmology;  Laterality: Right;  CDE: 22.17   CATARACT EXTRACTION W/PHACO Left 06/20/2022   Procedure: CATARACT EXTRACTION PHACO AND INTRAOCULAR LENS PLACEMENT (IOC);  Surgeon: Fabio Pierce, MD;  Location: [facility];  Service: Ophthalmology;  Laterality: Left;  CDE 20.85   FEMORAL HERNIA REPAIR Right 11/19/2022   Procedure: HERNIA REPAIR FEMORAL WITH MESH;  Surgeon: Lucretia Roers, MD;  Location: [facility];  Service: General;  Laterality: Right;   INGUINAL HERNIA REPAIR Left 12/09/2021   Procedure: HERNIA REPAIR INGUINAL ADULT WITH MESH;  Surgeon: Lucretia Roers, MD;  Location: [facility];  Service: General;  Laterality: Left;   INGUINAL HERNIA REPAIR Right 03/19/2022   Procedure: HERNIA REPAIR INGUINAL ADULT WITH MESH;  Surgeon: Lucretia Roers, MD;  Location: [facility];  Service: General;  Laterality: Right;   ORIF PATELLA Left 01/22/2023   Procedure: OPEN REDUCTION INTERNAL FIXATION (ORIF) PATELLA;  Surgeon: [name], MD;  Location: [facility];  Service: Orthopedics;  Laterality: Left;    Social History   Socioeconomic History   Marital status: Divorced    Spouse name: Not on file   Number of children: 3   Years of education:  Not on file   Highest education level: Not on file  Occupational History   Occupation: Armed forces technical officer: RETIRED  Tobacco Use   Smoking status: Former    Current packs/day: 0.00    Average packs/day: 1.5 packs/day for 54.0 years (81.0 ttl pk-yrs)    Types: Cigarettes    Start date: 12/02/1947    Quit date: 12/01/2001    Years since quitting: 21.8   Smokeless tobacco: Never  Vaping Use   Vaping status: Never Used  Substance and Sexual Activity   Alcohol use: No   Drug use: No   Sexual activity: Not on file  Other Topics Concern   Not on file  Social History Narrative   Lives with sister.    Social Determinants of  Health   Financial Resource Strain: Low Risk  (02/07/2022)   Overall Financial Resource Strain (CARDIA)    Difficulty of Paying Living Expenses: Not hard at all  Food Insecurity: Low Risk  (08/06/2023)   Received from [facility]   Hunger Vital Sign    Worried About Running Out of Food in the Last Year: Never true    Ran Out of Food in the Last Year: Never true  Transportation Needs: No Transportation Needs (08/06/2023)   Received from [facility]    In the past 12 months, has lack of reliable transportation kept you from medical appointments, meetings, work or from getting things needed for daily living? : No  Physical Activity: Insufficiently Active (02/07/2022)   Exercise Vital Sign    Days of Exercise per Week: 3 days    Minutes of Exercise per Session: 20 min  Stress: No Stress Concern Present (02/07/2022)   [employer] of Occupational Health - Occupational Stress Questionnaire    Feeling of Stress : Not at all  Social Connections: Socially Isolated (02/07/2022)   Social Connection and Isolation Panel [NHANES]    Frequency of Communication with Friends and Family: More than three times a week    Frequency of Social Gatherings with Friends and Family: More than three times a week    Attends Religious Services: Never    Database administrator or Organizations: No    Attends Banker Meetings: Never    Marital Status: Divorced  Catering manager Violence: Not At Risk (01/20/2023)   Humiliation, Afraid, Rape, and Kick questionnaire    Fear of Current or Ex-Partner: No    Emotionally Abused: No    Physically Abused: No    Sexually Abused: No   Family History  Problem Relation Age of Onset   Lung cancer Mother       VITAL SIGNS BP 124/62   Pulse 62   Temp (!) 97.1 F (36.2 C)   Resp 18   Ht 5\' 5"  (1.651 m)   Wt 145 lb 3.2 oz (65.9 kg)   SpO2 96%   BMI 24.16 kg/m   Outpatient Encounter Medications as of 09/28/2023  Medication Sig    albuterol (VENTOLIN HFA) 108 (90 Base) MCG/ACT inhaler Inhale 2 puffs into the lungs every 6 (six) hours as needed for wheezing or shortness of breath.   Albuterol Sulfate 2.5 MG/0.5ML NEBU Inhale 1 each into the lungs 3 (three) times daily.   ascorbic acid (VITAMIN C) 500 MG tablet Take 1,000 mg by mouth daily.   benzocaine-menthol (CHLORASEPTIC) 6-10 MG lozenge Take 1 lozenge by mouth every 4 (four) hours as needed for sore throat.   Budeson-Glycopyrrol-Formoterol (  BREZTRI AEROSPHERE) 160-9-4.8 MCG/ACT AERO Inhale 2 puffs into the lungs in the morning and at bedtime.   cholecalciferol (VITAMIN D) 25 MCG (1000 UNIT) tablet Take 3,000 Units by mouth daily.   feeding supplement (ENSURE ENLIVE / ENSURE PLUS) LIQD Take 237 mLs by mouth 3 (three) times daily between meals.   ferrous sulfate 325 (65 FE) MG EC tablet Take 325 mg by mouth every Monday, Wednesday, and Friday.   finasteride (PROSCAR) 5 MG tablet Take 1 tablet (5 mg total) by mouth daily.   ipratropium-albuterol (DUONEB) 0.5-2.5 (3) MG/3ML SOLN Take 3 mLs by nebulization every 6 (six) hours as needed (wheezing, shortness of breath).   ipratropium-albuterol (DUONEB) 0.5-2.5 (3) MG/3ML SOLN Take 3 mLs by nebulization every 6 (six) hours.   losartan (COZAAR) 25 MG tablet Take 12.5 mg by mouth daily.   methocarbamol (ROBAXIN) 500 MG tablet Take 500 mg by mouth 3 (three) times daily.   OXYGEN Inhale 2 L into the lungs as directed. Every Shift; Day, Evening, Night   pyridoxine (B-6) 100 MG tablet Take 100 mg by mouth daily.   tamsulosin (FLOMAX) 0.4 MG CAPS capsule Take 2 capsules (0.8 mg total) by mouth daily.   UNABLE TO FIND Diet: Dysphagia 2/Thin Liquids, Consistent carbohydrate   white petrolatum ointment Apply topically once.   No facility-administered encounter medications on file as of 09/28/2023.     SIGNIFICANT DIAGNOSTIC EXAMS  LABS REVIEWED; PREVIOUS   01-20-23; wbc 7.9 hgb 15.0; hct 45.6; mcv 89.2 plt 246; glucose 118 bun 31;  creat 0.96; k+ 4.2; na++ 140; ca 10.4; gfr >60; protein 7.0; albumin 3.9 01-23-23; wbc 13.0; hgb 11.3; hct 34.0; mcv 90.7 plt 166;glucose 149; bun 17; creat 0.84; k+ 3.9; na++ 134; ca 8.6; gfr >60; phos 2.4 albumin 2.7; mag 1.9; vitamin B12: 837; folate 15.5; iron 19; tibc 320; ferritin 72.  01-25-23: hgb A1c 6.8 01-27-23: wbc 11.0; hgb 11.2; hct 34.4; mcv 88.9 plt 215; glucose 169; bun 24; creat 0.72; k+ 3.4; na++ 138; ca 8.9; gfr >60; protein 5.6; albumin 2.5; mag 1.9 01-29-23; wbc 11.7; hgb 11.5; hct 35.8; mcv 90.4 plt 235; glucose 117; bun 32; creat 0.88; k+ 3.5; na++ 138; ca 8.6; gfr >60; protein 5.7 albumin 2.6 mag 2.1 03-12-23 wbc 7.8; hgb 12.3; hct 38.6; mcv 91.3 plt 289; glucose 114; bun 24; creat 0.89; k+ 4.0; na++ 135; ca 9.1 gfr >60; protein 6.6 albumin 3.1 tsh 3.506; vitamin B 12: 1393 rpr: nr   TODAY  09-28-23: wbc 9.6; hgb 16.0; hgb 50.4 mcv 95.1 plt 251 glucose 141; bun 28; creat 0.83; k+ 3.9; na++ 137; ca 9.6 gfr >60; protein 7.1; albumin 3.7; hgb A1c 6.9 iron 93 tibc 436; chol 258; ldl 174; trig 163; hdl 51 psa 0.6 tsh 6.055; vitamin D 62.23; ACR 10.9  Review of Systems  Constitutional:  Negative for malaise/fatigue.  Respiratory:  Negative for cough and shortness of breath.   Cardiovascular:  Negative for chest pain, palpitations and leg swelling.  Gastrointestinal:  Negative for abdominal pain, constipation and heartburn.  Musculoskeletal:  Negative for back pain, joint pain and myalgias.  Skin: Negative.   Neurological:  Negative for dizziness.  Psychiatric/Behavioral:  The patient is not nervous/anxious.     Physical Exam Constitutional:      General: He is not in acute distress.    Appearance: He is well-developed. He is not diaphoretic.  Neck:     Thyroid: No thyromegaly.  Cardiovascular:     Rate and Rhythm: Normal  rate and regular rhythm.     Pulses: Normal pulses.     Heart sounds: Normal heart sounds.  Pulmonary:     Effort: Pulmonary effort is normal. No  respiratory distress.     Breath sounds: Normal breath sounds.  Abdominal:     General: Bowel sounds are normal. There is no distension.     Palpations: Abdomen is soft.     Tenderness: There is no abdominal tenderness.  Musculoskeletal:        General: Normal range of motion.     Cervical back: Neck supple.     Right lower leg: No edema.     Left lower leg: No edema.  Lymphadenopathy:     Cervical: No cervical adenopathy.  Skin:    General: Skin is warm and dry.  Neurological:     Mental Status: He is alert. Mental status is at baseline.     Comments: 02-18-23: SLUMS: 15/30  Psychiatric:        Mood and Affect: Mood normal.     ASSESSMENT/ PLAN:  TODAY  Acquired hypothyroidism Hyperlipidemia associated with type 2 diabetes mellitus  Will begin levothyroxine 50 mcg daily and will check labs on 11-09-23 Will not begin statin due to advanced age   Synthia Innocent NP [facility]   call [phone]

## 2023-09-29 LAB — MICROALBUMIN / CREATININE URINE RATIO
Creatinine, Urine: 88.5 mg/dL
Microalb Creat Ratio: 12 mg/g{creat} (ref 0–29)
Microalb, Ur: 10.9 ug/mL — ABNORMAL HIGH

## 2023-09-29 LAB — VITAMIN D 25 HYDROXY (VIT D DEFICIENCY, FRACTURES): Vit D, 25-Hydroxy: 62.23 ng/mL (ref 30–100)

## 2023-09-30 DIAGNOSIS — E785 Hyperlipidemia, unspecified: Secondary | ICD-10-CM | POA: Insufficient documentation

## 2023-09-30 DIAGNOSIS — E039 Hypothyroidism, unspecified: Secondary | ICD-10-CM | POA: Insufficient documentation

## 2023-10-01 DIAGNOSIS — R1312 Dysphagia, oropharyngeal phase: Secondary | ICD-10-CM | POA: Diagnosis not present

## 2023-10-02 DIAGNOSIS — R1312 Dysphagia, oropharyngeal phase: Secondary | ICD-10-CM | POA: Diagnosis not present

## 2023-10-05 DIAGNOSIS — R1312 Dysphagia, oropharyngeal phase: Secondary | ICD-10-CM | POA: Diagnosis not present

## 2023-10-06 ENCOUNTER — Non-Acute Institutional Stay (SKILLED_NURSING_FACILITY): Payer: 59 | Admitting: Adult Health

## 2023-10-06 DIAGNOSIS — R1312 Dysphagia, oropharyngeal phase: Secondary | ICD-10-CM | POA: Diagnosis not present

## 2023-10-06 DIAGNOSIS — R635 Abnormal weight gain: Secondary | ICD-10-CM | POA: Diagnosis not present

## 2023-10-06 DIAGNOSIS — J439 Emphysema, unspecified: Secondary | ICD-10-CM | POA: Diagnosis not present

## 2023-10-06 DIAGNOSIS — E039 Hypothyroidism, unspecified: Secondary | ICD-10-CM

## 2023-10-07 ENCOUNTER — Encounter: Payer: Self-pay | Admitting: Adult Health

## 2023-10-07 DIAGNOSIS — R1312 Dysphagia, oropharyngeal phase: Secondary | ICD-10-CM | POA: Diagnosis not present

## 2023-10-07 NOTE — Progress Notes (Signed)
Location:  [facility] Room Number: 107 Place of Service:  SNF (31)   CODE STATUS: dnr   Allergies  Allergen Reactions   Benzyl Alcohol Itching   Amlodipine Swelling    Swelling in feet   Aspirin Other (See Comments)    Upset stomach.   Other Hypertension    Hazelnuts   Penicillins Hives    Has patient had a PCN reaction causing immediate rash, facial/tongue/throat swelling, SOB or lightheadedness with hypotension: Yes Has patient had a PCN reaction causing severe rash involving mucus membranes or skin necrosis: No Has patient had a PCN reaction that required hospitalization No Has patient had a PCN reaction occurring within the last 10 years: No If all of the above answers are "NO", then may proceed with Cephalosporin use.    Sulfa Antibiotics Hives   Tylenol [Acetaminophen] Other (See Comments)    Pt reports he has had liver issues and was told not to take tylenol     Chief Complaint  Patient presents with   Acute Visit    Weight gain     HPI:  He has been gaining weight; his weight is up 8 pounds over the past 2 months. His current weight is 151.4 pounds. He states that he has an excessive appetite. There are no reports of lower extremity edema; no chest pain; no shortness of breath. Prior to his admission he was not eating; he did have food insecurity. He is due for tsh on 11-09-23.   Past Medical History:  Diagnosis Date   Arthritis    Chronic rhinitis    Complication of anesthesia    patient usually hs to be intubated with anesthesia   COPD (chronic obstructive pulmonary disease) (HCC)    PFT 06-26-09 FEV1  1.75( 71%), FVC 3.65( 101%), FEV1% 48, TLC 5.53(104%), DLCO 55%, no BD   Dyspnea    Hypertension    Nasal septal deviation    Nocturia    On home O2    2L N/C   OSA (obstructive sleep apnea) 04/21/2011   Partial small bowel obstruction (HCC) 10/18/2021   Skin cancer     Past Surgical History:  Procedure Laterality Date    APPENDECTOMY     BACK SURGERY     CATARACT EXTRACTION W/PHACO  01/06/2013   Procedure: CATARACT EXTRACTION PHACO AND INTRAOCULAR LENS PLACEMENT (IOC);  Surgeon: Gemma Payor, MD;  Location: [facility];  Service: Ophthalmology;  Laterality: Right;  CDE: 22.17   CATARACT EXTRACTION W/PHACO Left 06/20/2022   Procedure: CATARACT EXTRACTION PHACO AND INTRAOCULAR LENS PLACEMENT (IOC);  Surgeon: Fabio Pierce, MD;  Location: [facility];  Service: Ophthalmology;  Laterality: Left;  CDE 20.85   FEMORAL HERNIA REPAIR Right 11/19/2022   Procedure: HERNIA REPAIR FEMORAL WITH MESH;  Surgeon: Lucretia Roers, MD;  Location: [facility];  Service: General;  Laterality: Right;   INGUINAL HERNIA REPAIR Left 12/09/2021   Procedure: HERNIA REPAIR INGUINAL ADULT WITH MESH;  Surgeon: Lucretia Roers, MD;  Location: [facility];  Service: General;  Laterality: Left;   INGUINAL HERNIA REPAIR Right 03/19/2022   Procedure: HERNIA REPAIR INGUINAL ADULT WITH MESH;  Surgeon: Lucretia Roers, MD;  Location: [facility];  Service: General;  Laterality: Right;   ORIF PATELLA Left 01/22/2023   Procedure: OPEN REDUCTION INTERNAL FIXATION (ORIF) PATELLA;  Surgeon: [name], MD;  Location: [facility];  Service: Orthopedics;  Laterality: Left;    Social History   Socioeconomic History   Marital  status: Divorced    Spouse name: Not on file   Number of children: 3   Years of education: Not on file   Highest education level: Not on file  Occupational History   Occupation: Armed forces technical officer: RETIRED  Tobacco Use   Smoking status: Former    Current packs/day: 0.00    Average packs/day: 1.5 packs/day for 54.0 years (81.0 ttl pk-yrs)    Types: Cigarettes    Start date: 12/02/1947    Quit date: 12/01/2001    Years since quitting: 21.8   Smokeless tobacco: Never  Vaping Use   Vaping status: Never Used  Substance and Sexual Activity   Alcohol use: No   Drug use: No   Sexual activity: Not on file  Other Topics Concern   Not on file   Social History Narrative   Lives with sister.    Social Determinants of Health   Financial Resource Strain: Low Risk  (02/07/2022)   Overall Financial Resource Strain (CARDIA)    Difficulty of Paying Living Expenses: Not hard at all  Food Insecurity: Low Risk  (08/06/2023)   Received from [facility]   Hunger Vital Sign    Worried About Running Out of Food in the Last Year: Never true    Ran Out of Food in the Last Year: Never true  Transportation Needs: No Transportation Needs (08/06/2023)   Received from [facility]    In the past 12 months, has lack of reliable transportation kept you from medical appointments, meetings, work or from getting things needed for daily living? : No  Physical Activity: Insufficiently Active (02/07/2022)   Exercise Vital Sign    Days of Exercise per Week: 3 days    Minutes of Exercise per Session: 20 min  Stress: No Stress Concern Present (02/07/2022)   [employer] of Occupational Health - Occupational Stress Questionnaire    Feeling of Stress : Not at all  Social Connections: Socially Isolated (02/07/2022)   Social Connection and Isolation Panel [NHANES]    Frequency of Communication with Friends and Family: More than three times a week    Frequency of Social Gatherings with Friends and Family: More than three times a week    Attends Religious Services: Never    Database administrator or Organizations: No    Attends Banker Meetings: Never    Marital Status: Divorced  Catering manager Violence: Not At Risk (01/20/2023)   Humiliation, Afraid, Rape, and Kick questionnaire    Fear of Current or Ex-Partner: No    Emotionally Abused: No    Physically Abused: No    Sexually Abused: No   Family History  Problem Relation Age of Onset   Lung cancer Mother       VITAL SIGNS BP 124/62   Pulse 67   Temp 97.7 F (36.5 C)   Resp (!) 22   Ht 5\' 5"  (1.651 m)   Wt 151 lb 6.4 oz (68.7 kg)   SpO2 95%   BMI 25.19  kg/m   Outpatient Encounter Medications as of 10/06/2023  Medication Sig   albuterol (VENTOLIN HFA) 108 (90 Base) MCG/ACT inhaler Inhale 2 puffs into the lungs every 6 (six) hours as needed for wheezing or shortness of breath.   Albuterol Sulfate 2.5 MG/0.5ML NEBU Inhale 1 each into the lungs 3 (three) times daily.   ascorbic acid (VITAMIN C) 500 MG tablet Take 1,000 mg by mouth daily.   benzocaine-menthol (  CHLORASEPTIC) 6-10 MG lozenge Take 1 lozenge by mouth every 4 (four) hours as needed for sore throat.   Budeson-Glycopyrrol-Formoterol (BREZTRI AEROSPHERE) 160-9-4.8 MCG/ACT AERO Inhale 2 puffs into the lungs in the morning and at bedtime.   cholecalciferol (VITAMIN D) 25 MCG (1000 UNIT) tablet Take 3,000 Units by mouth daily.   feeding supplement (ENSURE ENLIVE / ENSURE PLUS) LIQD Take 237 mLs by mouth 3 (three) times daily between meals.   ferrous sulfate 325 (65 FE) MG EC tablet Take 325 mg by mouth every Monday, Wednesday, and Friday.   finasteride (PROSCAR) 5 MG tablet Take 1 tablet (5 mg total) by mouth daily.   ipratropium-albuterol (DUONEB) 0.5-2.5 (3) MG/3ML SOLN Take 3 mLs by nebulization every 6 (six) hours as needed (wheezing, shortness of breath).   ipratropium-albuterol (DUONEB) 0.5-2.5 (3) MG/3ML SOLN Take 3 mLs by nebulization every 6 (six) hours.   levothyroxine (SYNTHROID) 50 MCG tablet Take 50 mcg by mouth daily before breakfast.   losartan (COZAAR) 25 MG tablet Take 12.5 mg by mouth daily.   methocarbamol (ROBAXIN) 500 MG tablet Take 500 mg by mouth every 8 (eight) hours as needed for muscle spasms.   OXYGEN Inhale 2 L into the lungs as directed. Every Shift; Day, Evening, Night   pyridoxine (B-6) 100 MG tablet Take 100 mg by mouth daily.   tamsulosin (FLOMAX) 0.4 MG CAPS capsule Take 2 capsules (0.8 mg total) by mouth daily.   UNABLE TO FIND Diet: Dysphagia 2/Thin Liquids, Consistent carbohydrate   white petrolatum ointment Apply topically once.   No  facility-administered encounter medications on file as of 10/06/2023.     SIGNIFICANT DIAGNOSTIC EXAMS   LABS REVIEWED; PREVIOUS   01-20-23; wbc 7.9 hgb 15.0; hct 45.6; mcv 89.2 plt 246; glucose 118 bun 31; creat 0.96; k+ 4.2; na++ 140; ca 10.4; gfr >60; protein 7.0; albumin 3.9 01-23-23; wbc 13.0; hgb 11.3; hct 34.0; mcv 90.7 plt 166;glucose 149; bun 17; creat 0.84; k+ 3.9; na++ 134; ca 8.6; gfr >60; phos 2.4 albumin 2.7; mag 1.9; vitamin B12: 837; folate 15.5; iron 19; tibc 320; ferritin 72.  01-25-23: hgb A1c 6.8 01-27-23: wbc 11.0; hgb 11.2; hct 34.4; mcv 88.9 plt 215; glucose 169; bun 24; creat 0.72; k+ 3.4; na++ 138; ca 8.9; gfr >60; protein 5.6; albumin 2.5; mag 1.9 01-29-23; wbc 11.7; hgb 11.5; hct 35.8; mcv 90.4 plt 235; glucose 117; bun 32; creat 0.88; k+ 3.5; na++ 138; ca 8.6; gfr >60; protein 5.7 albumin 2.6 mag 2.1 03-12-23 wbc 7.8; hgb 12.3; hct 38.6; mcv 91.3 plt 289; glucose 114; bun 24; creat 0.89; k+ 4.0; na++ 135; ca 9.1 gfr >60; protein 6.6 albumin 3.1 tsh 3.506; vitamin B 12: 1393 rpr: nr   TODAY  09-28-23: wbc 9.6; hgb 16.0; hgb 50.4 mcv 95.1 plt 251 glucose 141; bun 28; creat 0.83; k+ 3.9; na++ 137; ca 9.6 gfr >60; protein 7.1; albumin 3.7; hgb A1c 6.9 iron 93 tibc 436; chol 258; ldl 174; trig 163; hdl 51 psa 0.6 tsh 6.055; vitamin D 62.23; ACR 10.9  Review of Systems  Constitutional:  Negative for malaise/fatigue.  Respiratory:  Negative for cough and shortness of breath.   Cardiovascular:  Negative for chest pain, palpitations and leg swelling.  Gastrointestinal:  Negative for abdominal pain, constipation and heartburn.  Musculoskeletal:  Negative for back pain, joint pain and myalgias.  Skin: Negative.   Neurological:  Negative for dizziness.  Endo/Heme/Allergies:        Has excessive appetite   Psychiatric/Behavioral:  The patient is not nervous/anxious.     Physical Exam Constitutional:      General: He is not in acute distress.    Appearance: He is  well-developed. He is not diaphoretic.  Neck:     Thyroid: No thyromegaly.  Cardiovascular:     Rate and Rhythm: Normal rate and regular rhythm.     Pulses: Normal pulses.     Heart sounds: Normal heart sounds.  Pulmonary:     Effort: Pulmonary effort is normal. No respiratory distress.     Breath sounds: Normal breath sounds.  Abdominal:     General: Bowel sounds are normal. There is no distension.     Palpations: Abdomen is soft.     Tenderness: There is no abdominal tenderness.  Musculoskeletal:        General: Normal range of motion.     Cervical back: Neck supple.  Lymphadenopathy:     Cervical: No cervical adenopathy.  Skin:    General: Skin is warm and dry.  Neurological:     Mental Status: He is alert. Mental status is at baseline.     Comments: 02-18-23: SLUMS: 15/30  Psychiatric:        Mood and Affect: Mood normal.      ASSESSMENT/ PLAN:  TODAY  Pulmonary emphysema  unspecified emphysema type Weight gain Hypothyroidism  At this time his weight gain is beneficial. Will not make any changes awaiting his thyroid labs.    Synthia Innocent NP [facility]   call [phone]

## 2023-10-08 DIAGNOSIS — R1312 Dysphagia, oropharyngeal phase: Secondary | ICD-10-CM | POA: Diagnosis not present

## 2023-10-09 DIAGNOSIS — R1312 Dysphagia, oropharyngeal phase: Secondary | ICD-10-CM | POA: Diagnosis not present

## 2023-10-09 DIAGNOSIS — I739 Peripheral vascular disease, unspecified: Secondary | ICD-10-CM | POA: Diagnosis not present

## 2023-10-09 DIAGNOSIS — L603 Nail dystrophy: Secondary | ICD-10-CM | POA: Diagnosis not present

## 2023-10-09 DIAGNOSIS — L602 Onychogryphosis: Secondary | ICD-10-CM | POA: Diagnosis not present

## 2023-10-12 DIAGNOSIS — R1312 Dysphagia, oropharyngeal phase: Secondary | ICD-10-CM | POA: Diagnosis not present

## 2023-10-12 DIAGNOSIS — R635 Abnormal weight gain: Secondary | ICD-10-CM | POA: Insufficient documentation

## 2023-10-13 DIAGNOSIS — R1312 Dysphagia, oropharyngeal phase: Secondary | ICD-10-CM | POA: Diagnosis not present

## 2023-10-14 ENCOUNTER — Encounter: Payer: Self-pay | Admitting: Internal Medicine

## 2023-10-14 ENCOUNTER — Non-Acute Institutional Stay (SKILLED_NURSING_FACILITY): Payer: 59 | Admitting: Internal Medicine

## 2023-10-14 DIAGNOSIS — E1169 Type 2 diabetes mellitus with other specified complication: Secondary | ICD-10-CM

## 2023-10-14 DIAGNOSIS — E039 Hypothyroidism, unspecified: Secondary | ICD-10-CM

## 2023-10-14 DIAGNOSIS — E785 Hyperlipidemia, unspecified: Secondary | ICD-10-CM

## 2023-10-14 DIAGNOSIS — J9611 Chronic respiratory failure with hypoxia: Secondary | ICD-10-CM | POA: Diagnosis not present

## 2023-10-14 DIAGNOSIS — I10 Essential (primary) hypertension: Secondary | ICD-10-CM

## 2023-10-14 DIAGNOSIS — D509 Iron deficiency anemia, unspecified: Secondary | ICD-10-CM | POA: Diagnosis not present

## 2023-10-14 NOTE — Assessment & Plan Note (Signed)
Prior H/H 12.3/38.6 has resolved with current values of 16/8 discontinue iron supplement and monitor.

## 2023-10-14 NOTE — Patient Instructions (Signed)
See assessment and plan under each diagnosis in the problem list and acutely for this visit 

## 2023-10-14 NOTE — Assessment & Plan Note (Signed)
BP controlled; no change in antihypertensive medications  

## 2023-10-14 NOTE — Assessment & Plan Note (Signed)
Currently O2 sats excellent on present levels of supplemental oxygen.  No change in pulmonary toilet.

## 2023-10-14 NOTE — Assessment & Plan Note (Signed)
On 09/28/2023 TSH range at 6.055.  L-thyroxine dose increased to 50 mcg.  Recheck TSH and 8 weeks.

## 2023-10-14 NOTE — Progress Notes (Unsigned)
NURSING HOME LOCATION:  [facility] ROOM NUMBER:  [id number]  CODE STATUS:  Full Code  PCP:  Synthia Innocent NP  This is a nursing facility follow up visit of chronic medical diagnoses & to document compliance with Regulation 483.30 (c) in The Long Term Care Survey Manual Phase 2 which mandates caregiver visit ( visits can alternate among physician, PA or NP as per statutes) within 10 days of 30 days / 60 days/ 90 days post admission to SNF date    Interim medical record and care since last SNF visit was updated with review of diagnostic studies and change in clinical status since last visit were documented.  HPI: He is a permanent resident of this facility with advanced COPD, essential hypertension, history of skin cancer, and history of partial small bowel obstruction. Labs are current as of 09/28/2023 and reveal mild prerenal azotemia with BUN of 28, creatinine 0.83, and GFR of > 60.Marland Kitchen LDL was 174 and total cholesterol 258.  Patient prior anemia of 12.3/38.6 has resolved with current values of 16/50.4.  A1c was 6.9% indicating excellent control.  TSH had risen from 3.506 up to 6.055 suggesting possible early hypothyroid  Review of systems: He states that his breathing is "slow."  He describes.  He realizes that his LDL is up and states "I want to try and bring it down and do everything I can help out."  He describes an irritated area in the left nostril related to the oxygen.  Stools are described as loose.  He had pain radiating down the inner aspects of each lower extremity down.  Constitutional: No fever, significant weight change, fatigue  Eyes: No redness, discharge, pain, vision change ENT/mouth: No nasal congestion,  purulent discharge, earache, change in hearing, sore throat  Cardiovascular: No chest pain, palpitations, paroxysmal nocturnal dyspnea, claudication, edema  Respiratory: No cough, sputum production, hemoptysis, DOE, significant snoring, apnea    Gastrointestinal: No heartburn, dysphagia, abdominal pain, nausea /vomiting, rectal bleeding, melena, change in bowels Genitourinary: No dysuria, hematuria, pyuria, incontinence, nocturia Musculoskeletal: No joint stiffness, joint swelling, weakness, pain Dermatologic: No rash, pruritus, change in appearance of skin Neurologic: No dizziness, headache, syncope, seizures, numbness, tingling Psychiatric: No significant anxiety, depression, insomnia, anorexia Endocrine: No change in hair/skin/nails, excessive thirst, excessive hunger, excessive urination  Hematologic/lymphatic: No significant bruising, lymphadenopathy, abnormal bleeding Allergy/immunology: No itchy/watery eyes, significant sneezing, urticaria, angioedema  Physical exam:  Pertinent or positive findings: He appears his age.  Hair is disheveled.  Eyebrows are decreased in density.  He is wearing nasal oxygen.  There is an erythematous area at the left distal nasal septum he is edentulous.  Lungs are silent.  There is respiratory variation to his heart rhythm.  Abdomen slightly protuberant.  Pedal pulses are not palpable.  There is slight clubbing of the nailbeds.  He has limb atrophy.  There is bland irregular hyperpigmentation and irregular distribution of the shins, greater on the right than General appearance: Adequately nourished; no acute distress, increased work of breathing is present.   Lymphatic: No lymphadenopathy about the head, neck, axilla. Eyes: No conjunctival inflammation or lid edema is present. There is no scleral icterus. Ears:  External ear exam shows no significant lesions or deformities.   Nose:  External nasal examination shows no deformity or inflammation. Nasal mucosa are pink and moist without lesions, exudates Oral exam:  Lips and gums are healthy appearing. There is no oropharyngeal erythema or exudate. Neck:  No thyromegaly, masses, tenderness noted.  Heart:  Normal rate and regular rhythm. S1 and S2  normal without gallop, murmur, click, rub .  Lungs: Chest clear to auscultation without wheezes, rhonchi, rales, rubs. Abdomen: Bowel sounds are normal. Abdomen is soft and nontender with no organomegaly, hernias, masses. GU: Deferred  Extremities:  No cyanosis, clubbing, edema  Neurologic exam : Cn 2-7 intact Strength equal  in upper & lower extremities Balance, Rhomberg, finger to nose testing could not be completed due to clinical state Deep tendon reflexes are equal Skin: Warm & dry w/o tenting. No significant lesions or rash.  See summary under each active problem in the Problem List with associated updated therapeutic plan

## 2023-10-14 NOTE — Assessment & Plan Note (Signed)
He denies history of CAD.  Michael Evans discussed benefit/risk low-dose rosuvastatin with the NP.

## 2023-10-15 ENCOUNTER — Encounter: Payer: Self-pay | Admitting: Internal Medicine

## 2023-10-27 ENCOUNTER — Encounter: Payer: Self-pay | Admitting: Adult Health

## 2023-10-27 NOTE — Progress Notes (Unsigned)
Location:  [facility] Room Number: 107 Place of Service:  SNF (31)   CODE STATUS: full   Allergies  Allergen Reactions   Benzyl Alcohol Itching   Amlodipine Swelling    Swelling in feet   Aspirin Other (See Comments)    Upset stomach.   Other Hypertension    Hazelnuts   Penicillins Hives    Has patient had a PCN reaction causing immediate rash, facial/tongue/throat swelling, SOB or lightheadedness with hypotension: Yes Has patient had a PCN reaction causing severe rash involving mucus membranes or skin necrosis: No Has patient had a PCN reaction that required hospitalization No Has patient had a PCN reaction occurring within the last 10 years: No If all of the above answers are "NO", then may proceed with Cephalosporin use.    Sulfa Antibiotics Hives   Tylenol [Acetaminophen] Other (See Comments)    Pt reports he has had liver issues and was told not to take tylenol     Chief Complaint  Patient presents with   Acute Visit    Care plan meeting     HPI:  We have come together for his care plan meeting. BIMS 12//15 mood 5/30: not sleeping well; nervous at times. He is ambulatory without falls. He is independent with his adls. He is occasionally incontinent of bladder and bowel. Dietary: D2 thin liquids; appetite 26-100%; feeds self; weight is 151.4 pounds. Therapy: none at this time. 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  Past Medical History:  Diagnosis Date   Arthritis    Chronic rhinitis    Complication of anesthesia    patient usually hs to be intubated with anesthesia   COPD (chronic obstructive pulmonary disease) (HCC)    PFT 06-26-09 FEV1  1.75( 71%), FVC 3.65( 101%), FEV1% 48, TLC 5.53(104%), DLCO 55%, no BD   Dyspnea    Hyperlipidemia    Hypertension    Nasal septal deviation     Nocturia    On home O2    2L N/C   OSA (obstructive sleep apnea) 04/21/2011   Partial small bowel obstruction (HCC) 10/18/2021   Skin cancer     Past Surgical History:  Procedure Laterality Date   APPENDECTOMY     BACK SURGERY     CATARACT EXTRACTION W/PHACO  01/06/2013   Procedure: CATARACT EXTRACTION PHACO AND INTRAOCULAR LENS PLACEMENT (IOC);  Surgeon: Gemma Payor, MD;  Location: [facility];  Service: Ophthalmology;  Laterality: Right;  CDE: 22.17   CATARACT EXTRACTION W/PHACO Left 06/20/2022   Procedure: CATARACT EXTRACTION PHACO AND INTRAOCULAR LENS PLACEMENT (IOC);  Surgeon: Fabio Pierce, MD;  Location: [facility];  Service: Ophthalmology;  Laterality: Left;  CDE 20.85   FEMORAL HERNIA REPAIR Right 11/19/2022   Procedure: HERNIA REPAIR FEMORAL WITH MESH;  Surgeon: Lucretia Roers, MD;  Location: [facility];  Service: General;  Laterality: Right;   INGUINAL HERNIA REPAIR Left 12/09/2021   Procedure: HERNIA REPAIR INGUINAL ADULT WITH MESH;  Surgeon: Lucretia Roers, MD;  Location: [facility];  Service: General;  Laterality: Left;   INGUINAL HERNIA REPAIR Right 03/19/2022   Procedure: HERNIA REPAIR INGUINAL ADULT WITH MESH;  Surgeon: Lucretia Roers, MD;  Location: [facility];  Service: General;  Laterality: Right;   ORIF PATELLA Left 01/22/2023   Procedure: OPEN REDUCTION INTERNAL FIXATION (ORIF) PATELLA;  Surgeon: Thane Edu  A, MD;  Location: [facility];  Service: Orthopedics;  Laterality: Left;    Social History   Socioeconomic History   Marital status: Divorced    Spouse name: Not on file   Number of children: 3   Years of education: Not on file   Highest education level: Not on file  Occupational History   Occupation: Armed forces technical officer: RETIRED  Tobacco Use   Smoking status: Former    Current packs/day: 0.00    Average packs/day: 1.5 packs/day for 54.0 years (81.0 ttl pk-yrs)    Types: Cigarettes    Start date: 12/02/1947    Quit date: 12/01/2001    Years since quitting: 21.9    Smokeless tobacco: Never  Vaping Use   Vaping status: Never Used  Substance and Sexual Activity   Alcohol use: No   Drug use: No   Sexual activity: Not on file  Other Topics Concern   Not on file  Social History Narrative   Lives with sister.    Social Determinants of Health   Financial Resource Strain: Low Risk  (02/07/2022)   Overall Financial Resource Strain (CARDIA)    Difficulty of Paying Living Expenses: Not hard at all  Food Insecurity: Low Risk  (08/06/2023)   Received from [facility]   Hunger Vital Sign    Worried About Running Out of Food in the Last Year: Never true    Ran Out of Food in the Last Year: Never true  Transportation Needs: No Transportation Needs (08/06/2023)   Received from [facility]    In the past 12 months, has lack of reliable transportation kept you from medical appointments, meetings, work or from getting things needed for daily living? : No  Physical Activity: Insufficiently Active (02/07/2022)   Exercise Vital Sign    Days of Exercise per Week: 3 days    Minutes of Exercise per Session: 20 min  Stress: No Stress Concern Present (02/07/2022)   [employer] of Occupational Health - Occupational Stress Questionnaire    Feeling of Stress : Not at all  Social Connections: Socially Isolated (02/07/2022)   Social Connection and Isolation Panel [NHANES]    Frequency of Communication with Friends and Family: More than three times a week    Frequency of Social Gatherings with Friends and Family: More than three times a week    Attends Religious Services: Never    Database administrator or Organizations: No    Attends Banker Meetings: Never    Marital Status: Divorced  Catering manager Violence: Not At Risk (01/20/2023)   Humiliation, Afraid, Rape, and Kick questionnaire    Fear of Current or Ex-Partner: No    Emotionally Abused: No    Physically Abused: No    Sexually Abused: No   Family History  Problem Relation  Age of Onset   Lung cancer Mother       VITAL SIGNS BP 131/74   Pulse 65   Temp (!) 97.5 F (36.4 C)   Resp 20   Ht 5\' 5"  (1.651 m)   Wt 151 lb 6.4 oz (68.7 kg)   SpO2 95%   BMI 25.19 kg/m   Outpatient Encounter Medications as of 10/28/2023  Medication Sig   albuterol (VENTOLIN HFA) 108 (90 Base) MCG/ACT inhaler Inhale 2 puffs into the lungs every 6 (six) hours as needed for wheezing or shortness of breath.   Albuterol Sulfate 2.5 MG/0.5ML NEBU Inhale 1 each into  the lungs 3 (three) times daily.   ascorbic acid (VITAMIN C) 500 MG tablet Take 1,000 mg by mouth daily.   benzocaine-menthol (CHLORASEPTIC) 6-10 MG lozenge Take 1 lozenge by mouth every 4 (four) hours as needed for sore throat.   Budeson-Glycopyrrol-Formoterol (BREZTRI AEROSPHERE) 160-9-4.8 MCG/ACT AERO Inhale 2 puffs into the lungs in the morning and at bedtime.   cholecalciferol (VITAMIN D) 25 MCG (1000 UNIT) tablet Take 3,000 Units by mouth daily.   feeding supplement (ENSURE ENLIVE / ENSURE PLUS) LIQD Take 237 mLs by mouth 3 (three) times daily between meals.   ferrous sulfate 325 (65 FE) MG EC tablet Take 325 mg by mouth every Monday, Wednesday, and Friday.   finasteride (PROSCAR) 5 MG tablet Take 1 tablet (5 mg total) by mouth daily.   ipratropium-albuterol (DUONEB) 0.5-2.5 (3) MG/3ML SOLN Take 3 mLs by nebulization every 6 (six) hours as needed (wheezing, shortness of breath).   ipratropium-albuterol (DUONEB) 0.5-2.5 (3) MG/3ML SOLN Take 3 mLs by nebulization every 6 (six) hours.   levothyroxine (SYNTHROID) 50 MCG tablet Take 50 mcg by mouth daily before breakfast.   losartan (COZAAR) 25 MG tablet Take 12.5 mg by mouth daily.   methocarbamol (ROBAXIN) 500 MG tablet Take 500 mg by mouth every 8 (eight) hours as needed for muscle spasms.   OXYGEN Inhale 2 L into the lungs as directed. Every Shift; Day, Evening, Night   pyridoxine (B-6) 100 MG tablet Take 100 mg by mouth daily.   tamsulosin (FLOMAX) 0.4 MG CAPS  capsule Take 2 capsules (0.8 mg total) by mouth daily.   UNABLE TO FIND Diet: Dysphagia 2/Thin Liquids, Consistent carbohydrate   white petrolatum ointment Apply topically once.   No facility-administered encounter medications on file as of 10/28/2023.     SIGNIFICANT DIAGNOSTIC EXAMS  LABS REVIEWED; PREVIOUS   01-20-23; wbc 7.9 hgb 15.0; hct 45.6; mcv 89.2 plt 246; glucose 118 bun 31; creat 0.96; k+ 4.2; na++ 140; ca 10.4; gfr >60; protein 7.0; albumin 3.9 01-23-23; wbc 13.0; hgb 11.3; hct 34.0; mcv 90.7 plt 166;glucose 149; bun 17; creat 0.84; k+ 3.9; na++ 134; ca 8.6; gfr >60; phos 2.4 albumin 2.7; mag 1.9; vitamin B12: 837; folate 15.5; iron 19; tibc 320; ferritin 72.  01-25-23: hgb A1c 6.8 01-27-23: wbc 11.0; hgb 11.2; hct 34.4; mcv 88.9 plt 215; glucose 169; bun 24; creat 0.72; k+ 3.4; na++ 138; ca 8.9; gfr >60; protein 5.6; albumin 2.5; mag 1.9 01-29-23; wbc 11.7; hgb 11.5; hct 35.8; mcv 90.4 plt 235; glucose 117; bun 32; creat 0.88; k+ 3.5; na++ 138; ca 8.6; gfr >60; protein 5.7 albumin 2.6 mag 2.1 03-12-23 wbc 7.8; hgb 12.3; hct 38.6; mcv 91.3 plt 289; glucose 114; bun 24; creat 0.89; k+ 4.0; na++ 135; ca 9.1 gfr >60; protein 6.6 albumin 3.1 tsh 3.506; vitamin B 12: 1393 rpr: nr  09-28-23: wbc 9.6; hgb 16.0; hgb 50.4 mcv 95.1 plt 251 glucose 141; bun 28; creat 0.83; k+ 3.9; na++ 137; ca 9.6 gfr >60; protein 7.1; albumin 3.7; hgb A1c 6.9 iron 93 tibc 436; chol 258; ldl 174; trig 163; hdl 51 psa 0.6 tsh 6.055; vitamin D 62.23; ACR 10.9  NO NEW LABS.    Review of Systems  Constitutional:  Negative for malaise/fatigue.  Respiratory:  Negative for cough and shortness of breath.   Cardiovascular:  Negative for chest pain, palpitations and leg swelling.  Gastrointestinal:  Negative for abdominal pain, constipation and heartburn.  Musculoskeletal:  Negative for back pain, joint pain and  myalgias.  Skin: Negative.   Neurological:  Negative for dizziness.  Psychiatric/Behavioral:  The patient  is not nervous/anxious.    Physical Exam Constitutional:      General: He is not in acute distress.    Appearance: He is well-developed. He is not diaphoretic.  Neck:     Thyroid: No thyromegaly.  Cardiovascular:     Rate and Rhythm: Normal rate and regular rhythm.     Pulses: Normal pulses.     Heart sounds: Normal heart sounds.  Pulmonary:     Effort: Pulmonary effort is normal. No respiratory distress.     Breath sounds: Normal breath sounds.  Abdominal:     General: Bowel sounds are normal. There is no distension.     Palpations: Abdomen is soft.     Tenderness: There is no abdominal tenderness.  Musculoskeletal:        General: Normal range of motion.     Cervical back: Neck supple.     Right lower leg: No edema.     Left lower leg: No edema.  Lymphadenopathy:     Cervical: No cervical adenopathy.  Skin:    General: Skin is warm and dry.  Neurological:     Mental Status: He is alert. Mental status is at baseline.     Comments: 02-18-23: SLUMS: 15/30   Psychiatric:        Mood and Affect: Mood normal.      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   Time spent with patient: 40 minutes: medications; dietary; overall health status.   Synthia Innocent NP [facility]  call [phone]

## 2023-10-28 ENCOUNTER — Non-Acute Institutional Stay (SKILLED_NURSING_FACILITY): Payer: 59 | Admitting: Adult Health

## 2023-10-28 DIAGNOSIS — F0150 Vascular dementia without behavioral disturbance: Secondary | ICD-10-CM | POA: Diagnosis not present

## 2023-10-28 DIAGNOSIS — J9611 Chronic respiratory failure with hypoxia: Secondary | ICD-10-CM | POA: Diagnosis not present

## 2023-10-28 DIAGNOSIS — R627 Adult failure to thrive: Secondary | ICD-10-CM | POA: Diagnosis not present

## 2023-11-09 ENCOUNTER — Other Ambulatory Visit (HOSPITAL_COMMUNITY)
Admission: RE | Admit: 2023-11-09 | Discharge: 2023-11-09 | Disposition: A | Discharge status: Home / Self Care | Payer: 59 | Source: Skilled Nursing Facility | Attending: Adult Health | Admitting: Adult Health

## 2023-11-09 DIAGNOSIS — E039 Hypothyroidism, unspecified: Secondary | ICD-10-CM | POA: Insufficient documentation

## 2023-11-09 LAB — TSH: TSH: 3.912 u[IU]/mL (ref 0.350–4.500)

## 2023-11-09 LAB — T4, FREE: Free T4: 1.17 ng/dL — ABNORMAL HIGH (ref 0.61–1.12)

## 2023-11-11 ENCOUNTER — Encounter: Payer: Self-pay | Admitting: Adult Health

## 2023-11-11 ENCOUNTER — Non-Acute Institutional Stay (SKILLED_NURSING_FACILITY): Payer: 59 | Admitting: Adult Health

## 2023-11-11 DIAGNOSIS — E1165 Type 2 diabetes mellitus with hyperglycemia: Secondary | ICD-10-CM | POA: Diagnosis not present

## 2023-11-11 DIAGNOSIS — F0150 Vascular dementia without behavioral disturbance: Secondary | ICD-10-CM

## 2023-11-11 DIAGNOSIS — I700 Atherosclerosis of aorta: Secondary | ICD-10-CM

## 2023-11-11 NOTE — Progress Notes (Signed)
Location:  [facility] Room Number: 107-D Place of Service:  SNF (31)   CODE STATUS: Full Code  Allergies  Allergen Reactions   Benzyl Alcohol Itching   Amlodipine Swelling    Swelling in feet   Aspirin Other (See Comments)    Upset stomach.   Other Hypertension    Hazelnuts   Penicillins Hives    Has patient had a PCN reaction causing immediate rash, facial/tongue/throat swelling, SOB or lightheadedness with hypotension: Yes Has patient had a PCN reaction causing severe rash involving mucus membranes or skin necrosis: No Has patient had a PCN reaction that required hospitalization No Has patient had a PCN reaction occurring within the last 10 years: No If all of the above answers are "NO", then may proceed with Cephalosporin use.    Sulfa Antibiotics Hives   Tylenol [Acetaminophen] Other (See Comments)    Pt reports he has had liver issues and was told not to take tylenol     Chief Complaint  Patient presents with   Medical Management of Chronic Issues                Type 2 diabetes with hyperglycemia without long term current use of insulin:  Vascular dementia without behavioral disturbance: Aortic atherosclerosis     HPI:  He is a 82 year old long term resident of this facility being seen for the management of his chronic illnesses;   Type 2 diabetes with hyperglycemia without long term current use of insulin:  Vascular dementia without behavioral disturbance: Aortic atherosclerosis.  He denies any cough or shortness of breath. There are no reports of uncontrolled pain.   Past Medical History:  Diagnosis Date   Arthritis    Chronic rhinitis    Complication of anesthesia    patient usually hs to be intubated with anesthesia   COPD (chronic obstructive pulmonary disease) (HCC)    PFT 06-26-09 FEV1  1.75( 71%), FVC 3.65( 101%), FEV1% 48, TLC 5.53(104%), DLCO 55%, no BD   Dyspnea    Hyperlipidemia    Hypertension    Nasal septal deviation     Nocturia    On home O2    2L N/C   OSA (obstructive sleep apnea) 04/21/2011   Partial small bowel obstruction (HCC) 10/18/2021   Skin cancer     Past Surgical History:  Procedure Laterality Date   APPENDECTOMY     BACK SURGERY     CATARACT EXTRACTION W/PHACO  01/06/2013   Procedure: CATARACT EXTRACTION PHACO AND INTRAOCULAR LENS PLACEMENT (IOC);  Surgeon: Gemma Payor, MD;  Location: [facility];  Service: Ophthalmology;  Laterality: Right;  CDE: 22.17   CATARACT EXTRACTION W/PHACO Left 06/20/2022   Procedure: CATARACT EXTRACTION PHACO AND INTRAOCULAR LENS PLACEMENT (IOC);  Surgeon: Fabio Pierce, MD;  Location: [facility];  Service: Ophthalmology;  Laterality: Left;  CDE 20.85   FEMORAL HERNIA REPAIR Right 11/19/2022   Procedure: HERNIA REPAIR FEMORAL WITH MESH;  Surgeon: Lucretia Roers, MD;  Location: [facility];  Service: General;  Laterality: Right;   INGUINAL HERNIA REPAIR Left 12/09/2021   Procedure: HERNIA REPAIR INGUINAL ADULT WITH MESH;  Surgeon: Lucretia Roers, MD;  Location: [facility];  Service: General;  Laterality: Left;   INGUINAL HERNIA REPAIR Right 03/19/2022   Procedure: HERNIA REPAIR INGUINAL ADULT WITH MESH;  Surgeon: Lucretia Roers, MD;  Location: [facility];  Service: General;  Laterality: Right;   ORIF PATELLA Left 01/22/2023   Procedure: OPEN REDUCTION INTERNAL FIXATION (  ORIF) PATELLA;  Surgeon: [name], MD;  Location: [facility];  Service: Orthopedics;  Laterality: Left;    Social History   Socioeconomic History   Marital status: Divorced    Spouse name: Not on file   Number of children: 3   Years of education: Not on file   Highest education level: Not on file  Occupational History   Occupation: Armed forces technical officer: RETIRED  Tobacco Use   Smoking status: Former    Current packs/day: 0.00    Average packs/day: 1.5 packs/day for 54.0 years (81.0 ttl pk-yrs)    Types: Cigarettes    Start date: 12/02/1947    Quit date: 12/01/2001    Years since quitting: 21.9    Smokeless tobacco: Never  Vaping Use   Vaping status: Never Used  Substance and Sexual Activity   Alcohol use: No   Drug use: No   Sexual activity: Not on file  Other Topics Concern   Not on file  Social History Narrative   Lives with sister.    Social Determinants of Health   Financial Resource Strain: Low Risk  (02/07/2022)   Overall Financial Resource Strain (CARDIA)    Difficulty of Paying Living Expenses: Not hard at all  Food Insecurity: Low Risk  (08/06/2023)   Received from [facility]   Hunger Vital Sign    Worried About Running Out of Food in the Last Year: Never true    Ran Out of Food in the Last Year: Never true  Transportation Needs: No Transportation Needs (08/06/2023)   Received from [facility]    In the past 12 months, has lack of reliable transportation kept you from medical appointments, meetings, work or from getting things needed for daily living? : No  Physical Activity: Insufficiently Active (02/07/2022)   Exercise Vital Sign    Days of Exercise per Week: 3 days    Minutes of Exercise per Session: 20 min  Stress: No Stress Concern Present (02/07/2022)   [employer] of Occupational Health - Occupational Stress Questionnaire    Feeling of Stress : Not at all  Social Connections: Socially Isolated (02/07/2022)   Social Connection and Isolation Panel [NHANES]    Frequency of Communication with Friends and Family: More than three times a week    Frequency of Social Gatherings with Friends and Family: More than three times a week    Attends Religious Services: Never    Database administrator or Organizations: No    Attends Banker Meetings: Never    Marital Status: Divorced  Catering manager Violence: Not At Risk (01/20/2023)   Humiliation, Afraid, Rape, and Kick questionnaire    Fear of Current or Ex-Partner: No    Emotionally Abused: No    Physically Abused: No    Sexually Abused: No   Family History  Problem Relation  Age of Onset   Lung cancer Mother       VITAL SIGNS BP 130/70   Pulse 74   Temp 98.6 F (37 C)   Resp (!) 24   Ht 5' 6.5" (1.689 m)   Wt 149 lb 9.6 oz (67.9 kg)   SpO2 96%   BMI 23.78 kg/m   Outpatient Encounter Medications as of 11/11/2023  Medication Sig   Albuterol Sulfate 2.5 MG/0.5ML NEBU Inhale 1 each into the lungs 3 (three) times daily.   ascorbic acid (VITAMIN C) 500 MG tablet Take 1,000 mg by mouth daily.  Budeson-Glycopyrrol-Formoterol (BREZTRI AEROSPHERE) 160-9-4.8 MCG/ACT AERO Inhale 2 puffs into the lungs in the morning and at bedtime.   cholecalciferol (VITAMIN D) 25 MCG (1000 UNIT) tablet Take 3,000 Units by mouth daily.   feeding supplement (ENSURE ENLIVE / ENSURE PLUS) LIQD Take 237 mLs by mouth 3 (three) times daily between meals.   ferrous sulfate 325 (65 FE) MG EC tablet Take 325 mg by mouth every Monday, Wednesday, and Friday.   finasteride (PROSCAR) 5 MG tablet Take 1 tablet (5 mg total) by mouth daily.   levothyroxine (SYNTHROID) 50 MCG tablet Take 50 mcg by mouth daily before breakfast.   losartan (COZAAR) 25 MG tablet Take 12.5 mg by mouth daily.   methocarbamol (ROBAXIN) 500 MG tablet Take 500 mg by mouth every 8 (eight) hours as needed for muscle spasms.   OXYGEN Inhale 2 L into the lungs as directed. Every Shift; Day, Evening, Night   pyridoxine (B-6) 100 MG tablet Take 100 mg by mouth daily.   tamsulosin (FLOMAX) 0.4 MG CAPS capsule Take 2 capsules (0.8 mg total) by mouth daily.   UNABLE TO FIND Diet: Dysphagia 2/Thin Liquids, Consistent carbohydrate   albuterol (VENTOLIN HFA) 108 (90 Base) MCG/ACT inhaler Inhale 2 puffs into the lungs every 6 (six) hours as needed for wheezing or shortness of breath. (Patient not taking: Reported on 11/11/2023)   benzocaine-menthol (CHLORASEPTIC) 6-10 MG lozenge Take 1 lozenge by mouth every 4 (four) hours as needed for sore throat. (Patient not taking: Reported on 11/11/2023)   ipratropium-albuterol (DUONEB)  0.5-2.5 (3) MG/3ML SOLN Take 3 mLs by nebulization every 6 (six) hours as needed (wheezing, shortness of breath). (Patient not taking: Reported on 11/11/2023)   ipratropium-albuterol (DUONEB) 0.5-2.5 (3) MG/3ML SOLN Take 3 mLs by nebulization every 6 (six) hours. (Patient not taking: Reported on 11/11/2023)   white petrolatum ointment Apply topically once. (Patient not taking: Reported on 11/11/2023)   No facility-administered encounter medications on file as of 11/11/2023.     SIGNIFICANT DIAGNOSTIC EXAMS  LABS REVIEWED; PREVIOUS   01-20-23; wbc 7.9 hgb 15.0; hct 45.6; mcv 89.2 plt 246; glucose 118 bun 31; creat 0.96; k+ 4.2; na++ 140; ca 10.4; gfr >60; protein 7.0; albumin 3.9 01-23-23; wbc 13.0; hgb 11.3; hct 34.0; mcv 90.7 plt 166;glucose 149; bun 17; creat 0.84; k+ 3.9; na++ 134; ca 8.6; gfr >60; phos 2.4 albumin 2.7; mag 1.9; vitamin B12: 837; folate 15.5; iron 19; tibc 320; ferritin 72.  01-25-23: hgb A1c 6.8 01-27-23: wbc 11.0; hgb 11.2; hct 34.4; mcv 88.9 plt 215; glucose 169; bun 24; creat 0.72; k+ 3.4; na++ 138; ca 8.9; gfr >60; protein 5.6; albumin 2.5; mag 1.9 01-29-23; wbc 11.7; hgb 11.5; hct 35.8; mcv 90.4 plt 235; glucose 117; bun 32; creat 0.88; k+ 3.5; na++ 138; ca 8.6; gfr >60; protein 5.7 albumin 2.6 mag 2.1 03-12-23 wbc 7.8; hgb 12.3; hct 38.6; mcv 91.3 plt 289; glucose 114; bun 24; creat 0.89; k+ 4.0; na++ 135; ca 9.1 gfr >60; protein 6.6 albumin 3.1 tsh 3.506; vitamin B 12: 1393 rpr: nr  09-28-23: wbc 9.6; hgb 16.0; hgb 50.4 mcv 95.1 plt 251 glucose 141; bun 28; creat 0.83; k+ 3.9; na++ 137; ca 9.6 gfr >60; protein 7.1; albumin 3.7; hgb A1c 6.9 iron 93 tibc 436; chol 258; ldl 174; trig 163; hdl 51 psa 0.6 tsh 6.055; vitamin D 62.23; ACR 10.9  TODAY  11-09-23: tsh 3.912 free t4: 1.17  Review of Systems  Constitutional:  Negative for malaise/fatigue.  Respiratory:  Negative  for cough and shortness of breath.   Cardiovascular:  Negative for chest pain, palpitations and leg  swelling.  Gastrointestinal:  Negative for abdominal pain, constipation and heartburn.  Musculoskeletal:  Negative for back pain, joint pain and myalgias.  Skin: Negative.   Neurological:  Negative for dizziness.  Psychiatric/Behavioral:  The patient is not nervous/anxious.    Physical Exam Constitutional:      General: He is not in acute distress.    Appearance: He is well-developed. He is not diaphoretic.  Neck:     Thyroid: No thyromegaly.  Cardiovascular:     Rate and Rhythm: Normal rate and regular rhythm.     Pulses: Normal pulses.     Heart sounds: Normal heart sounds.  Pulmonary:     Effort: Pulmonary effort is normal. No respiratory distress.     Breath sounds: Normal breath sounds.  Abdominal:     General: Bowel sounds are normal. There is no distension.     Palpations: Abdomen is soft.     Tenderness: There is no abdominal tenderness.  Musculoskeletal:        General: Normal range of motion.     Cervical back: Neck supple.     Right lower leg: No edema.     Left lower leg: No edema.  Lymphadenopathy:     Cervical: No cervical adenopathy.  Skin:    General: Skin is warm and dry.  Neurological:     Mental Status: He is alert. Mental status is at baseline.     Comments: 02-18-23: SLUMS: 15/30    Psychiatric:        Mood and Affect: Mood normal.           ASSESSMENT/ PLAN:  TODAY  Type 2 diabetes with hyperglycemia without long term current use of insulin: steroid induced; hgb A1c 6.8 will monitor  2. Vascular dementia without behavioral disturbance: weight is 149 pounds will monitor   3. Aortic atherosclerosis (ct 05-23-22) not on statin due to advanced age.   PREVIOUS   4. Essential hypertension: b/p 130/70: is on asa 81 mg daily   5. Pulmonary emphysema unspecified emphysema / chronic respiratory failure with hypoxia: has 81 pack year history. Will continue breztri 160-9-4.8  mcg 2 puffs twice daily albuterol neb or 2 puffs every 6 hours as needed  6.  Gastroesophageal reflux disease without esophagitis: off PPI    7. BPH with obstruction/urinary symptoms: will continue proscar 5mg  daily and flomax 0.8 mg daily    8. Protein calorie malnutrition, severe: protein 5.6 albumin 2.5 will continue ensure three times daily   9. Chronic constipation: will continue miralax daily   10. Iron deficiency anemia: hgb 12.3; iron 19; will continue iron three times weekly;    Synthia Innocent NP [facility]  call [phone]

## 2023-11-16 ENCOUNTER — Non-Acute Institutional Stay (SKILLED_NURSING_FACILITY): Payer: 59 | Admitting: Family Medicine

## 2023-11-16 DIAGNOSIS — D509 Iron deficiency anemia, unspecified: Secondary | ICD-10-CM | POA: Diagnosis not present

## 2023-11-16 DIAGNOSIS — H524 Presbyopia: Secondary | ICD-10-CM | POA: Diagnosis not present

## 2023-11-16 DIAGNOSIS — E039 Hypothyroidism, unspecified: Secondary | ICD-10-CM

## 2023-11-16 DIAGNOSIS — F0150 Vascular dementia without behavioral disturbance: Secondary | ICD-10-CM

## 2023-11-16 DIAGNOSIS — J439 Emphysema, unspecified: Secondary | ICD-10-CM

## 2023-11-16 DIAGNOSIS — E1165 Type 2 diabetes mellitus with hyperglycemia: Secondary | ICD-10-CM

## 2023-11-16 DIAGNOSIS — I10 Essential (primary) hypertension: Secondary | ICD-10-CM

## 2023-11-16 DIAGNOSIS — Z961 Presence of intraocular lens: Secondary | ICD-10-CM | POA: Diagnosis not present

## 2023-11-16 DIAGNOSIS — R627 Adult failure to thrive: Secondary | ICD-10-CM

## 2023-11-16 DIAGNOSIS — H35362 Drusen (degenerative) of macula, left eye: Secondary | ICD-10-CM | POA: Diagnosis not present

## 2023-11-16 DIAGNOSIS — N400 Enlarged prostate without lower urinary tract symptoms: Secondary | ICD-10-CM

## 2023-11-16 NOTE — Assessment & Plan Note (Signed)
TSH within normal limits. Continue Synthroid daily.

## 2023-11-16 NOTE — Assessment & Plan Note (Signed)
Respiratory status stable. Chronic hypoxic respiratory failure. Continue Breztri BID and 2L O2 [place] supplementation.

## 2023-11-16 NOTE — Addendum Note (Signed)
Addended by: Sharee Holster on: 11/16/2023 01:29 PM   Modules accepted: Level of Service

## 2023-11-16 NOTE — Assessment & Plan Note (Signed)
Continue Ferrous sulfate supplementation 3 times weekly. Repeat CBC and iron labs in 2 months.

## 2023-11-16 NOTE — Progress Notes (Signed)
Location:  [facility]  Nurs[facility] Home Room # 107-D   Place of Service:    SNF 31   CODE STATUS: Full Code  Allergies  Allergen Reactions   Benzyl Alcohol Itching   Amlodipine Swelling    Swelling in feet   Aspirin Other (See Comments)    Upset stomach.   Other Hypertension    Hazelnuts   Penicillins Hives    Has patient had a PCN reaction causing immediate rash, facial/tongue/throat swelling, SOB or lightheadedness with hypotension: Yes Has patient had a PCN reaction causing severe rash involving mucus membranes or skin necrosis: No Has patient had a PCN reaction that required hospitalization No Has patient had a PCN reaction occurring within the last 10 years: No If all of the above answers are "NO", then may proceed with Cephalosporin use.    Sulfa Antibiotics Hives   Tylenol [Acetaminophen] Other (See Comments)    Pt reports he has had liver issues and was told not to take tylenol     No chief complaint on file.   HPI:  Patient is an 82 y.o. long term resident of this facility being seen today for the management of his chronic illnesses. Regarding his COPD, he is stable on 2L [place] at all times. Reports that he does get short of breath with exertion. Has not had any sputum changes, or worsening cough. Notes that his legs are itchy because of his dry skin. Has been using on his legs but is unsure of how often.  Past Medical History:  Diagnosis Date   Arthritis    Chronic rhinitis    Complication of anesthesia    patient usually hs to be intubated with anesthesia   COPD (chronic obstructive pulmonary disease) (HCC)    PFT 06-26-09 FEV1  1.75( 71%), FVC 3.65( 101%), FEV1% 48, TLC 5.53(104%), DLCO 55%, no BD   Dyspnea    Hyperlipidemia    Hypertension    Nasal septal deviation    Nocturia    On home O2    2L N/C   OSA (obstructive sleep apnea) 04/21/2011   Partial small bowel obstruction (HCC) 10/18/2021   Skin cancer     Past Surgical History:  Procedure  Laterality Date   APPENDECTOMY     BACK SURGERY     CATARACT EXTRACTION W/PHACO  01/06/2013   Procedure: CATARACT EXTRACTION PHACO AND INTRAOCULAR LENS PLACEMENT (IOC);  Surgeon: Gemma Payor, MD;  Location: [facility];  Service: Ophthalmology;  Laterality: Right;  CDE: 22.17   CATARACT EXTRACTION W/PHACO Left 06/20/2022   Procedure: CATARACT EXTRACTION PHACO AND INTRAOCULAR LENS PLACEMENT (IOC);  Surgeon: Fabio Pierce, MD;  Location: [facility];  Service: Ophthalmology;  Laterality: Left;  CDE 20.85   FEMORAL HERNIA REPAIR Right 11/19/2022   Procedure: HERNIA REPAIR FEMORAL WITH MESH;  Surgeon: Lucretia Roers, MD;  Location: [facility];  Service: General;  Laterality: Right;   INGUINAL HERNIA REPAIR Left 12/09/2021   Procedure: HERNIA REPAIR INGUINAL ADULT WITH MESH;  Surgeon: Lucretia Roers, MD;  Location: [facility];  Service: General;  Laterality: Left;   INGUINAL HERNIA REPAIR Right 03/19/2022   Procedure: HERNIA REPAIR INGUINAL ADULT WITH MESH;  Surgeon: Lucretia Roers, MD;  Location: [facility];  Service: General;  Laterality: Right;   ORIF PATELLA Left 01/22/2023   Procedure: OPEN REDUCTION INTERNAL FIXATION (ORIF) PATELLA;  Surgeon: [name], MD;  Location: [facility];  Service: Orthopedics;  Laterality: Left;    Social History  Socioeconomic History   Marital status: Divorced    Spouse name: Not on file   Number of children: 3   Years of education: Not on file   Highest education level: Not on file  Occupational History   Occupation: Armed forces technical officer: RETIRED  Tobacco Use   Smoking status: Former    Current packs/day: 0.00    Average packs/day: 1.5 packs/day for 54.0 years (81.0 ttl pk-yrs)    Types: Cigarettes    Start date: 12/02/1947    Quit date: 12/01/2001    Years since quitting: 21.9   Smokeless tobacco: Never  Vaping Use   Vaping status: Never Used  Substance and Sexual Activity   Alcohol use: No   Drug use: No   Sexual activity: Not on file  Other Topics Concern    Not on file  Social History Narrative   Lives with sister.    Social Drivers of Corporate investment banker Strain: Low Risk  (02/07/2022)   Overall Financial Resource Strain (CARDIA)    Difficulty of Paying Living Expenses: Not hard at all  Food Insecurity: Low Risk  (08/06/2023)   Received from [facility]   Hunger Vital Sign    Worried About Running Out of Food in the Last Year: Never true    Ran Out of Food in the Last Year: Never true  Transportation Needs: No Transportation Needs (08/06/2023)   Received from [facility]    In the past 12 months, has lack of reliable transportation kept you from medical appointments, meetings, work or from getting things needed for daily living? : No  Physical Activity: Insufficiently Active (02/07/2022)   Exercise Vital Sign    Days of Exercise per Week: 3 days    Minutes of Exercise per Session: 20 min  Stress: No Stress Concern Present (02/07/2022)   [employer] of Occupational Health - Occupational Stress Questionnaire    Feeling of Stress : Not at all  Social Connections: Socially Isolated (02/07/2022)   Social Connection and Isolation Panel [NHANES]    Frequency of Communication with Friends and Family: More than three times a week    Frequency of Social Gatherings with Friends and Family: More than three times a week    Attends Religious Services: Never    Database administrator or Organizations: No    Attends Banker Meetings: Never    Marital Status: Divorced  Catering manager Violence: Not At Risk (01/20/2023)   Humiliation, Afraid, Rape, and Kick questionnaire    Fear of Current or Ex-Partner: No    Emotionally Abused: No    Physically Abused: No    Sexually Abused: No   Family History  Problem Relation Age of Onset   Lung cancer Mother       VITAL SIGNS There were no vitals taken for this visit.  Outpatient Encounter Medications as of 11/16/2023  Medication Sig   albuterol  (VENTOLIN HFA) 108 (90 Base) MCG/ACT inhaler Inhale 2 puffs into the lungs every 6 (six) hours as needed for wheezing or shortness of breath. (Patient not taking: Reported on 11/11/2023)   Albuterol Sulfate 2.5 MG/0.5ML NEBU Inhale 1 each into the lungs 3 (three) times daily.   ascorbic acid (VITAMIN C) 500 MG tablet Take 1,000 mg by mouth daily.   benzocaine-menthol (CHLORASEPTIC) 6-10 MG lozenge Take 1 lozenge by mouth every 4 (four) hours as needed for sore throat. (Patient not taking: Reported on 11/11/2023)  Budeson-Glycopyrrol-Formoterol (BREZTRI AEROSPHERE) 160-9-4.8 MCG/ACT AERO Inhale 2 puffs into the lungs in the morning and at bedtime.   cholecalciferol (VITAMIN D) 25 MCG (1000 UNIT) tablet Take 3,000 Units by mouth daily.   feeding supplement (ENSURE ENLIVE / ENSURE PLUS) LIQD Take 237 mLs by mouth 3 (three) times daily between meals.   ferrous sulfate 325 (65 FE) MG EC tablet Take 325 mg by mouth every Monday, Wednesday, and Friday.   finasteride (PROSCAR) 5 MG tablet Take 1 tablet (5 mg total) by mouth daily.   ipratropium-albuterol (DUONEB) 0.5-2.5 (3) MG/3ML SOLN Take 3 mLs by nebulization every 6 (six) hours as needed (wheezing, shortness of breath). (Patient not taking: Reported on 11/11/2023)   ipratropium-albuterol (DUONEB) 0.5-2.5 (3) MG/3ML SOLN Take 3 mLs by nebulization every 6 (six) hours. (Patient not taking: Reported on 11/11/2023)   levothyroxine (SYNTHROID) 50 MCG tablet Take 50 mcg by mouth daily before breakfast.   losartan (COZAAR) 25 MG tablet Take 12.5 mg by mouth daily.   methocarbamol (ROBAXIN) 500 MG tablet Take 500 mg by mouth every 8 (eight) hours as needed for muscle spasms.   OXYGEN Inhale 2 L into the lungs as directed. Every Shift; Day, Evening, Night   pyridoxine (B-6) 100 MG tablet Take 100 mg by mouth daily.   tamsulosin (FLOMAX) 0.4 MG CAPS capsule Take 2 capsules (0.8 mg total) by mouth daily.   UNABLE TO FIND Diet: Dysphagia 2/Thin Liquids,  Consistent carbohydrate   white petrolatum ointment Apply topically once. (Patient not taking: Reported on 11/11/2023)   No facility-administered encounter medications on file as of 11/16/2023.     SIGNIFICANT DIAGNOSTIC EXAMS  11/15/23 Vitals Signs - T97.6, O94, BP127/63, SpO2 98 Weight 145-150lbs  Recent labs reviewed. TSH - 3.912. Hgb A1c 6.9  General: A&O, NAD, lying comfortably in bed HEENT: No sign of trauma, EOM grossly intact, moist mucous membranes Cardiac: RRR, no m/r/g Respiratory: CTAB, normal WOB on 2L [place], no w/c/r GI: non-distended Extremities: NTTP, no peripheral edema, mild xerosis Neuro: Moves all four extremities appropriately. Psych: Appropriate mood and affect    ASSESSMENT/ PLAN:  Assessment & Plan Pulmonary emphysema, unspecified emphysema type (HCC) Respiratory status stable. Chronic hypoxic respiratory failure. Continue Breztri BID and 2L O2 [place] supplementation.  Controlled type 2 diabetes mellitus with hyperglycemia, without long-term current use of insulin (HCC) Repeat A1c January. Continue daily CBG monitoring. Vascular dementia without behavioral disturbance (HCC) A&O today. Weight is stable. Essential hypertension BP controlled. Continue Losartan 25mg  daily. Adult failure to thrive syndrome Weight stable. Continue nutrition supplementation. Acquired hypothyroidism TSH within normal limits. Continue Synthroid daily. Iron deficiency anemia, unspecified iron deficiency anemia type Continue Ferrous sulfate supplementation 3 times weekly. Repeat CBC and iron labs in 2 months. Benign prostatic hyperplasia, unspecified whether lower urinary tract symptoms present Continue Flomax and Finasteride daily.  On review of patient's administered medications.  -Receiving Vitamin B6 daily for unclear cause. Discontinue at this time.  -Consider checking Vitamin D level at future visit and consider need for further supplementation.  -Consider stopping  vitamin C supplementation.  Celine Mans, MD, PGY-2 [facility] 11:42 AM 11/16/2023    Synthia Innocent NP [facility]  Contact [phone] Monday through Friday 8am- 5pm  After hours call [phone]

## 2023-11-16 NOTE — Assessment & Plan Note (Signed)
A&O today. Weight is stable.

## 2023-11-16 NOTE — Assessment & Plan Note (Signed)
Repeat A1c January. Continue daily CBG monitoring.

## 2023-11-16 NOTE — Assessment & Plan Note (Signed)
Weight stable. Continue nutrition supplementation.

## 2023-11-16 NOTE — Assessment & Plan Note (Signed)
BP controlled. Continue Losartan 25mg  daily.

## 2023-11-27 ENCOUNTER — Other Ambulatory Visit (HOSPITAL_COMMUNITY)
Admission: RE | Admit: 2023-11-27 | Discharge: 2023-11-27 | Disposition: A | Discharge status: Home / Self Care | Payer: 59 | Source: Skilled Nursing Facility | Attending: Adult Health | Admitting: Adult Health

## 2023-11-27 DIAGNOSIS — I10 Essential (primary) hypertension: Secondary | ICD-10-CM | POA: Insufficient documentation

## 2023-11-27 LAB — BASIC METABOLIC PANEL
Anion gap: 6 (ref 5–15)
BUN: 23 mg/dL (ref 8–23)
CO2: 25 mmol/L (ref 22–32)
Calcium: 9 mg/dL (ref 8.9–10.3)
Chloride: 103 mmol/L (ref 98–111)
Creatinine, Ser: 0.7 mg/dL (ref 0.61–1.24)
GFR, Estimated: >60 mL/min (ref >60)
Glucose, Bld: 131 mg/dL — ABNORMAL HIGH (ref 70–99)
Potassium: 3.8 mmol/L (ref 3.5–5.1)
Sodium: 134 mmol/L — ABNORMAL LOW (ref 135–145)

## 2023-12-22 ENCOUNTER — Encounter: Payer: Self-pay | Admitting: Adult Health

## 2023-12-22 ENCOUNTER — Non-Acute Institutional Stay (SKILLED_NURSING_FACILITY): Payer: 59 | Admitting: Adult Health

## 2023-12-22 DIAGNOSIS — E1159 Type 2 diabetes mellitus with other circulatory complications: Secondary | ICD-10-CM

## 2023-12-22 DIAGNOSIS — J439 Emphysema, unspecified: Secondary | ICD-10-CM | POA: Diagnosis not present

## 2023-12-22 DIAGNOSIS — K219 Gastro-esophageal reflux disease without esophagitis: Secondary | ICD-10-CM

## 2023-12-22 DIAGNOSIS — J9611 Chronic respiratory failure with hypoxia: Secondary | ICD-10-CM

## 2023-12-22 DIAGNOSIS — I152 Hypertension secondary to endocrine disorders: Secondary | ICD-10-CM | POA: Diagnosis not present

## 2023-12-22 NOTE — Progress Notes (Signed)
Location:  [facility] Room Number: [id number] Place of Service:  SNF (31)   CODE STATUS: full   Allergies  Allergen Reactions   Benzyl Alcohol Itching   Amlodipine Swelling    Swelling in feet   Aspirin Other (See Comments)    Upset stomach.   Other Hypertension    Hazelnuts   Penicillins Hives    Has patient had a PCN reaction causing immediate rash, facial/tongue/throat swelling, SOB or lightheadedness with hypotension: Yes Has patient had a PCN reaction causing severe rash involving mucus membranes or skin necrosis: No Has patient had a PCN reaction that required hospitalization No Has patient had a PCN reaction occurring within the last 10 years: No If all of the above answers are "NO", then may proceed with Cephalosporin use.    Sulfa Antibiotics Hives   Tylenol [Acetaminophen] Other (See Comments)    Pt reports he has had liver issues and was told not to take tylenol     Chief Complaint  Patient presents with   Medical Management of Chronic Issues           Hypertension associated with type 2 diabetes mellitus:    Pulmonary emphysema unspecified emphysema / chronic respiratory failure with hpyoxia:  Gastroesophageal reflux disease:      HPI:  He is a 83 year old long term resident of this facility being seen for the management of his chronic illnesses:  Hypertension associated with type 2 diabetes mellitus:    Pulmonary emphysema unspecified emphysema / chronic respiratory failure with hpyoxia:  Gastroesophageal reflux disease. He is without significant change in status. He denies any uncontrolled pain. There are no reports of anxiety or depressive thoughts.   Past Medical History:  Diagnosis Date   Arthritis    Chronic rhinitis    Complication of anesthesia    patient usually hs to be intubated with anesthesia   COPD (chronic obstructive pulmonary disease) (HCC)    PFT 06-26-09 FEV1  1.75( 71%), FVC 3.65( 101%), FEV1% 48, TLC 5.53(104%), DLCO  55%, no BD   Dyspnea    Hyperlipidemia    Hypertension    Nasal septal deviation    Nocturia    On home O2    2L N/C   OSA (obstructive sleep apnea) 04/21/2011   Partial small bowel obstruction (HCC) 10/18/2021   Skin cancer     Past Surgical History:  Procedure Laterality Date   APPENDECTOMY     BACK SURGERY     CATARACT EXTRACTION W/PHACO  01/06/2013   Procedure: CATARACT EXTRACTION PHACO AND INTRAOCULAR LENS PLACEMENT (IOC);  Surgeon: Gemma Payor, MD;  Location: [facility];  Service: Ophthalmology;  Laterality: Right;  CDE: 22.17   CATARACT EXTRACTION W/PHACO Left 06/20/2022   Procedure: CATARACT EXTRACTION PHACO AND INTRAOCULAR LENS PLACEMENT (IOC);  Surgeon: Fabio Pierce, MD;  Location: [facility];  Service: Ophthalmology;  Laterality: Left;  CDE 20.85   FEMORAL HERNIA REPAIR Right 11/19/2022   Procedure: HERNIA REPAIR FEMORAL WITH MESH;  Surgeon: Lucretia Roers, MD;  Location: [facility];  Service: General;  Laterality: Right;   INGUINAL HERNIA REPAIR Left 12/09/2021   Procedure: HERNIA REPAIR INGUINAL ADULT WITH MESH;  Surgeon: Lucretia Roers, MD;  Location: [facility];  Service: General;  Laterality: Left;   INGUINAL HERNIA REPAIR Right 03/19/2022   Procedure: HERNIA REPAIR INGUINAL ADULT WITH MESH;  Surgeon: Lucretia Roers, MD;  Location: [facility];  Service: General;  Laterality: Right;   ORIF PATELLA  Left 01/22/2023   Procedure: OPEN REDUCTION INTERNAL FIXATION (ORIF) PATELLA;  Surgeon: [name], MD;  Location: [facility];  Service: Orthopedics;  Laterality: Left;    Social History   Socioeconomic History   Marital status: Divorced    Spouse name: Not on file   Number of children: 3   Years of education: Not on file   Highest education level: Not on file  Occupational History   Occupation: Armed forces technical officer: RETIRED  Tobacco Use   Smoking status: Former    Current packs/day: 0.00    Average packs/day: 1.5 packs/day for 54.0 years (81.0 ttl pk-yrs)    Types:  Cigarettes    Start date: 12/02/1947    Quit date: 12/01/2001    Years since quitting: 22.0   Smokeless tobacco: Never  Vaping Use   Vaping status: Never Used  Substance and Sexual Activity   Alcohol use: No   Drug use: No   Sexual activity: Not on file  Other Topics Concern   Not on file  Social History Narrative   Lives with sister.    Social Drivers of Corporate investment banker Strain: Low Risk  (02/07/2022)   Overall Financial Resource Strain (CARDIA)    Difficulty of Paying Living Expenses: Not hard at all  Food Insecurity: Low Risk  (08/06/2023)   Received from [facility]   Hunger Vital Sign    Worried About Running Out of Food in the Last Year: Never true    Ran Out of Food in the Last Year: Never true  Transportation Needs: No Transportation Needs (08/06/2023)   Received from [facility]    In the past 12 months, has lack of reliable transportation kept you from medical appointments, meetings, work or from getting things needed for daily living? : No  Physical Activity: Insufficiently Active (02/07/2022)   Exercise Vital Sign    Days of Exercise per Week: 3 days    Minutes of Exercise per Session: 20 min  Stress: No Stress Concern Present (02/07/2022)   [employer] of Occupational Health - Occupational Stress Questionnaire    Feeling of Stress : Not at all  Social Connections: Socially Isolated (02/07/2022)   Social Connection and Isolation Panel [NHANES]    Frequency of Communication with Friends and Family: More than three times a week    Frequency of Social Gatherings with Friends and Family: More than three times a week    Attends Religious Services: Never    Database administrator or Organizations: No    Attends Banker Meetings: Never    Marital Status: Divorced  Catering manager Violence: Not At Risk (01/20/2023)   Humiliation, Afraid, Rape, and Kick questionnaire    Fear of Current or Ex-Partner: No    Emotionally Abused:  No    Physically Abused: No    Sexually Abused: No   Family History  Problem Relation Age of Onset   Lung cancer Mother       VITAL SIGNS BP 110/68   Pulse 74   Temp 98.5 F (36.9 C)   Resp 20   Ht 5\' 6"  (1.676 m)   Wt 152 lb 3.2 oz (69 kg)   SpO2 96%   BMI 24.57 kg/m   Outpatient Encounter Medications as of 12/22/2023  Medication Sig   albuterol (VENTOLIN HFA) 108 (90 Base) MCG/ACT inhaler Inhale 2 puffs into the lungs every 6 (six) hours as needed for wheezing or  shortness of breath. (Patient not taking: Reported on 11/11/2023)   Albuterol Sulfate 2.5 MG/0.5ML NEBU Inhale 1 each into the lungs 3 (three) times daily.   ascorbic acid (VITAMIN C) 500 MG tablet Take 1,000 mg by mouth daily.   benzocaine-menthol (CHLORASEPTIC) 6-10 MG lozenge Take 1 lozenge by mouth every 4 (four) hours as needed for sore throat. (Patient not taking: Reported on 11/11/2023)   Budeson-Glycopyrrol-Formoterol (BREZTRI AEROSPHERE) 160-9-4.8 MCG/ACT AERO Inhale 2 puffs into the lungs in the morning and at bedtime.   cholecalciferol (VITAMIN D) 25 MCG (1000 UNIT) tablet Take 3,000 Units by mouth daily.   feeding supplement (ENSURE ENLIVE / ENSURE PLUS) LIQD Take 237 mLs by mouth 3 (three) times daily between meals.   ferrous sulfate 325 (65 FE) MG EC tablet Take 325 mg by mouth every Monday, Wednesday, and Friday.   finasteride (PROSCAR) 5 MG tablet Take 1 tablet (5 mg total) by mouth daily.   ipratropium-albuterol (DUONEB) 0.5-2.5 (3) MG/3ML SOLN Take 3 mLs by nebulization every 6 (six) hours as needed (wheezing, shortness of breath). (Patient not taking: Reported on 11/11/2023)   ipratropium-albuterol (DUONEB) 0.5-2.5 (3) MG/3ML SOLN Take 3 mLs by nebulization every 6 (six) hours. (Patient not taking: Reported on 11/11/2023)   levothyroxine (SYNTHROID) 50 MCG tablet Take 50 mcg by mouth daily before breakfast.   losartan (COZAAR) 25 MG tablet Take 12.5 mg by mouth daily.   methocarbamol (ROBAXIN) 500 MG  tablet Take 500 mg by mouth every 8 (eight) hours as needed for muscle spasms.   OXYGEN Inhale 2 L into the lungs as directed. Every Shift; Day, Evening, Night   tamsulosin (FLOMAX) 0.4 MG CAPS capsule Take 2 capsules (0.8 mg total) by mouth daily.   UNABLE TO FIND Diet: Dysphagia 2/Thin Liquids, Consistent carbohydrate   white petrolatum ointment Apply topically once. (Patient not taking: Reported on 11/11/2023)   No facility-administered encounter medications on file as of 12/22/2023.     SIGNIFICANT DIAGNOSTIC EXAMS  LABS REVIEWED; PREVIOUS   01-20-23; wbc 7.9 hgb 15.0; hct 45.6; mcv 89.2 plt 246; glucose 118 bun 31; creat 0.96; k+ 4.2; na++ 140; ca 10.4; gfr >60; protein 7.0; albumin 3.9 01-23-23; wbc 13.0; hgb 11.3; hct 34.0; mcv 90.7 plt 166;glucose 149; bun 17; creat 0.84; k+ 3.9; na++ 134; ca 8.6; gfr >60; phos 2.4 albumin 2.7; mag 1.9; vitamin B12: 837; folate 15.5; iron 19; tibc 320; ferritin 72.  01-25-23: hgb A1c 6.8 01-27-23: wbc 11.0; hgb 11.2; hct 34.4; mcv 88.9 plt 215; glucose 169; bun 24; creat 0.72; k+ 3.4; na++ 138; ca 8.9; gfr >60; protein 5.6; albumin 2.5; mag 1.9 01-29-23; wbc 11.7; hgb 11.5; hct 35.8; mcv 90.4 plt 235; glucose 117; bun 32; creat 0.88; k+ 3.5; na++ 138; ca 8.6; gfr >60; protein 5.7 albumin 2.6 mag 2.1 03-12-23 wbc 7.8; hgb 12.3; hct 38.6; mcv 91.3 plt 289; glucose 114; bun 24; creat 0.89; k+ 4.0; na++ 135; ca 9.1 gfr >60; protein 6.6 albumin 3.1 tsh 3.506; vitamin B 12: 1393 rpr: nr  09-28-23: wbc 9.6; hgb 16.0; hgb 50.4 mcv 95.1 plt 251 glucose 141; bun 28; creat 0.83; k+ 3.9; na++ 137; ca 9.6 gfr >60; protein 7.1; albumin 3.7; hgb A1c 6.9 iron 93 tibc 436; chol 258; ldl 174; trig 163; hdl 51 psa 0.6 tsh 6.055; vitamin D 62.23; ACR 10.9  TODAY  11-09-23: tsh 3.912 free t4: 1.17 11-27-23: glucose 131; bun 23; creat 0.70; k+ 3.8; na++ 134; ca 9.0 gfr >60   Review  of Systems  Constitutional:  Negative for malaise/fatigue.  Respiratory:  Negative for cough  and shortness of breath.   Cardiovascular:  Negative for chest pain, palpitations and leg swelling.  Gastrointestinal:  Negative for abdominal pain, constipation and heartburn.  Musculoskeletal:  Negative for back pain, joint pain and myalgias.  Skin: Negative.   Neurological:  Negative for dizziness.  Psychiatric/Behavioral:  The patient is not nervous/anxious.    Physical Exam Constitutional:      General: He is not in acute distress.    Appearance: He is well-developed. He is not diaphoretic.  Neck:     Thyroid: No thyromegaly.  Cardiovascular:     Rate and Rhythm: Normal rate and regular rhythm.     Heart sounds: Normal heart sounds.  Pulmonary:     Effort: Pulmonary effort is normal. No respiratory distress.     Breath sounds: Normal breath sounds.  Abdominal:     General: Bowel sounds are normal. There is no distension.     Palpations: Abdomen is soft.     Tenderness: There is no abdominal tenderness.  Musculoskeletal:        General: Normal range of motion.     Cervical back: Neck supple.     Right lower leg: No edema.     Left lower leg: No edema.  Lymphadenopathy:     Cervical: No cervical adenopathy.  Skin:    General: Skin is warm and dry.  Neurological:     Mental Status: He is alert. Mental status is at baseline.     Comments: 02-18-23: SLUMS: 15/30    Psychiatric:        Mood and Affect: Mood normal.    ASSESSMENT/ PLAN:  TODAY  Hypertension associated with type 2 diabetes mellitus: b/p 110/68; is on asa 81 mg daily   2. Pulmonary emphysema unspecified emphysema / chronic respiratory failure with hpyoxia: has 81 year pack history. Will continue breztri 160-9-4.8 mcg 2 puffs twice daily; has albuterol neg or 2 puffs every 6 hours as needed  3. Gastroesophageal reflux disease: is off PPI    PREVIOUS   4. BPH with obstruction/urinary symptoms: will continue proscar 5mg  daily and flomax 0.8 mg daily    5. Protein calorie malnutrition, severe: protein 5.6  albumin 2.5 will continue ensure three times daily   6. Chronic constipation: will continue miralax daily   7. Iron deficiency anemia: hgb 12.3; iron 19; will continue iron three times weekly;   8. Type 2 diabetes with hyperglycemia without long term current use of insulin: steroid induced; hgb A1c 6.9 will monitor  9. Vascular dementia without behavioral disturbance: weight is 152 pounds will monitor   10. Aortic atherosclerosis (ct 05-23-22) not on statin due to advanced age.    Synthia Innocent NP [facility]  call [phone]

## 2023-12-31 ENCOUNTER — Non-Acute Institutional Stay (SKILLED_NURSING_FACILITY): Payer: 59 | Admitting: Internal Medicine

## 2023-12-31 ENCOUNTER — Encounter: Payer: Self-pay | Admitting: Internal Medicine

## 2023-12-31 DIAGNOSIS — E039 Hypothyroidism, unspecified: Secondary | ICD-10-CM | POA: Diagnosis not present

## 2023-12-31 DIAGNOSIS — I700 Atherosclerosis of aorta: Secondary | ICD-10-CM

## 2023-12-31 DIAGNOSIS — E1169 Type 2 diabetes mellitus with other specified complication: Secondary | ICD-10-CM | POA: Diagnosis not present

## 2023-12-31 DIAGNOSIS — E43 Unspecified severe protein-calorie malnutrition: Secondary | ICD-10-CM

## 2023-12-31 DIAGNOSIS — L959 Vasculitis limited to the skin, unspecified: Secondary | ICD-10-CM | POA: Diagnosis not present

## 2023-12-31 DIAGNOSIS — J9611 Chronic respiratory failure with hypoxia: Secondary | ICD-10-CM

## 2023-12-31 DIAGNOSIS — I10 Essential (primary) hypertension: Secondary | ICD-10-CM | POA: Diagnosis not present

## 2023-12-31 DIAGNOSIS — E785 Hyperlipidemia, unspecified: Secondary | ICD-10-CM

## 2023-12-31 NOTE — Assessment & Plan Note (Addendum)
This is an imaging diagnosis.  He denies any chest pain & denies any history of heart attack or stroke.  He is under the impression that he is on cholesterol medication; but he is not.  Lipids & A1c will be updated to assess need for intervention. He remains a Full Code.

## 2023-12-31 NOTE — Assessment & Plan Note (Addendum)
Nadir albumin had been 2.6; now low normal at 3.7.  Nadir total protein was 5.6; it is now normal at 7.1.  Nutritionist continues to monitor at [facility]

## 2023-12-31 NOTE — Assessment & Plan Note (Addendum)
Silent lungs of advanced emphysema on exam.O2 sats are excellent on 2 L of nasal oxygen. No change in pulmonary toilet regimen; continue to monitor.

## 2023-12-31 NOTE — Assessment & Plan Note (Addendum)
On 09/28/2023 TSH was elevated to 6.055; it is now therapeutic at 3.912 on 50 mcg of L-thyroxine.  Continue to monitor every 6 months to verify stability.

## 2023-12-31 NOTE — Patient Instructions (Signed)
See assessment and plan under each diagnosis in the problem list and acutely for this visit

## 2023-12-31 NOTE — Progress Notes (Signed)
NURSING HOME LOCATION:  [facility] ROOM NUMBER:  [id number]  CODE STATUS: Full Code   PCP:  Synthia Innocent NP  This is a nursing facility follow up visit  of chronic medical diagnoses & to document compliance with Regulation 483.30 (c) in The Long Term Care Survey Manual Phase 2 which mandates caregiver visit ( visits can alternate among physician, PA or NP as per statutes) within 10 days of 30 days / 60 days/ 90 days post admission to SNF date    Interim medical record and care since last SNF visit was updated with review of diagnostic studies and change in clinical status since last visit were documented.  HPI: He is a permanent resident of this facility with oxygen dependent COPD in the context of 80+ pack year smoking history; dyslipidemia; essential hypertension; history of partial small bowel obstruction; atherosclerosis on imaging,and OSA. Current labs reveal mild hyperglycemia with glucose recordings up to 141.  A1c was 6.9% on 09/28/2023.  He also has developed mild hyponatremia with a sodium of 134 down from 137.  CKD stage II was present with a creatinine of 0.77 and GFR greater than 60.  His TSH has been 6.055 on 09/28/2023; this has been corrected and is now therapeutic at 3.912 on L-thyroxine 50 mcg daily.  Review of systems: He is under the impression that he has been on medicine for cholesterol.  He denies any history of stroke or heart attack.  He states that he tries to avoid any foods with grease.  He was unaware that he is a diabetic.  Overall he states that he is doing "pretty good."  He does have stable advanced oxygen dependent dyspnea.  He describes a cough productive of 3-4 tablespoons of slightly yellow sputum a day.  He describes incontinence.  He has pruritus of the posterior thorax for which cream is applied at night with benefit.  He denies other extrinsic symptoms except for sneezing approximately 3 times per day.  Constitutional: No fever, significant  weight change, fatigue  Eyes: No redness, discharge, pain, vision change ENT/mouth: No nasal congestion,  purulent discharge, earache, change in hearing, sore throat  Cardiovascular: No chest pain, palpitations, paroxysmal nocturnal dyspnea, claudication, edema  Respiratory: No  hemoptysis, significant snoring, apnea   Gastrointestinal: No heartburn, dysphagia, abdominal pain, nausea /vomiting, rectal bleeding, melena, change in bowels Genitourinary: No dysuria, hematuria, pyuria Musculoskeletal: No joint stiffness, joint swelling, weakness, pain Dermatologic: No rash, change in appearance of skin Neurologic: No dizziness, headache, syncope, seizures, numbness, tingling Psychiatric: No significant anxiety, depression, insomnia, anorexia Endocrine: No change in hair/skin/nails, excessive thirst, excessive hunger, excessive urination  Hematologic/lymphatic: No significant bruising, lymphadenopathy, abnormal bleeding Allergy/immunology: No itchy/watery eyes, urticaria, angioedema  Physical exam:  Pertinent or positive findings: He appears his age and chronically ill.  Initially he was asleep in the wheelchair with head slumped forward.  He exhibited hypopnea without snoring or frank apnea.  Eyebrow density is decreased.  There is faint erythema of the left nares without purulence.  He is edentulous.  There is an oblique hyperpigmented line, @ the anterior chin which he states is "soot" imbedded after remote trauma.  He exhibits the silent lungs of end-stage COPD.  Heart is heard best in the epigastrium and rhythm is slightly irregular.  Abdomen is protuberant. BLE pulses are not palpable. Slight clubbing suggested.  He has small faint erythematous changes over the mid posterior thorax which blanch to pressure. There are hyperpigmented lesions over the forearms  and ecchymosis.  Limb atrophy is present.  General appearance: no acute distress, increased work of breathing is present.   Lymphatic: No  lymphadenopathy about the head, neck, axilla. Eyes: No conjunctival inflammation or lid edema is present. There is no scleral icterus. Ears:  External ear exam shows no significant lesions or deformities.   Nose:  External nasal examination shows no deformity or inflammation.  Neck:  No thyromegaly, masses, tenderness noted.    Heart:  No gallop, murmur, click, rub .  Lungs:  without wheezes, rhonchi, rales, rubs. Abdomen: Bowel sounds are normal. Abdomen is soft and nontender with no organomegaly, hernias, masses. GU: Deferred  Extremities:  No cyanosis, edema  Neurologic exam :Balance, Rhomberg, finger to nose testing could not be completed due to clinical state Skin: Warm & dry w/o tenting.  See summary under each active problem in the Problem List with associated updated therapeutic plan

## 2023-12-31 NOTE — Assessment & Plan Note (Addendum)
On 09/28/2023 calculated LDL was 174.  Also on that day mild hypothyroidism was suggested by a TSH of 6.055.  On L-thyroxine 50 mcg the TSH is therapeutic as of 11/09/2023 with a value of 3.912.  It is possible, but not likely, that the LDL has decreased to minimal goal of < 100 with correction of the hypothyroidism.  Fasting lipids can be repeated and shared decision making conducted with Michael Evans as to possible low-dose rosuvastatin if the LDL remains above 100. Diabetes is well-controlled as documented by  A1c of 6.9% on 09/28/2023. It will be updated also.

## 2023-12-31 NOTE — Assessment & Plan Note (Signed)
BP controlled; no change in antihypertensive medications

## 2024-01-01 ENCOUNTER — Other Ambulatory Visit (HOSPITAL_COMMUNITY)
Admission: RE | Admit: 2024-01-01 | Discharge: 2024-01-01 | Disposition: A | Discharge status: Home / Self Care | Payer: 59 | Source: Skilled Nursing Facility | Attending: Internal Medicine | Admitting: Internal Medicine

## 2024-01-01 DIAGNOSIS — R739 Hyperglycemia, unspecified: Secondary | ICD-10-CM | POA: Diagnosis present

## 2024-01-01 DIAGNOSIS — E119 Type 2 diabetes mellitus without complications: Secondary | ICD-10-CM | POA: Diagnosis not present

## 2024-01-01 DIAGNOSIS — E785 Hyperlipidemia, unspecified: Secondary | ICD-10-CM | POA: Diagnosis not present

## 2024-01-01 LAB — HEMOGLOBIN A1C
Hgb A1c MFr Bld: 7.1 % — ABNORMAL HIGH (ref 4.8–5.6)
Mean Plasma Glucose: 157.07 mg/dL

## 2024-01-01 LAB — LIPID PANEL
Cholesterol: 203 mg/dL — ABNORMAL HIGH (ref 0–200)
HDL: 40 mg/dL — ABNORMAL LOW (ref >40)
LDL Cholesterol: 126 mg/dL — ABNORMAL HIGH (ref 0–99)
Total CHOL/HDL Ratio: 5.1 {ratio}
Triglycerides: 187 mg/dL — ABNORMAL HIGH (ref <150)
VLDL: 37 mg/dL (ref 0–40)

## 2024-01-15 DIAGNOSIS — L84 Corns and callosities: Secondary | ICD-10-CM | POA: Diagnosis not present

## 2024-01-15 DIAGNOSIS — L602 Onychogryphosis: Secondary | ICD-10-CM | POA: Diagnosis not present

## 2024-01-15 DIAGNOSIS — I739 Peripheral vascular disease, unspecified: Secondary | ICD-10-CM | POA: Diagnosis not present

## 2024-01-15 DIAGNOSIS — L603 Nail dystrophy: Secondary | ICD-10-CM | POA: Diagnosis not present

## 2024-01-18 ENCOUNTER — Non-Acute Institutional Stay (SKILLED_NURSING_FACILITY): Payer: 59 | Admitting: Family Medicine

## 2024-01-18 ENCOUNTER — Encounter: Payer: Self-pay | Admitting: Family Medicine

## 2024-01-18 DIAGNOSIS — E1169 Type 2 diabetes mellitus with other specified complication: Secondary | ICD-10-CM | POA: Diagnosis not present

## 2024-01-18 DIAGNOSIS — I10 Essential (primary) hypertension: Secondary | ICD-10-CM | POA: Diagnosis not present

## 2024-01-18 DIAGNOSIS — E785 Hyperlipidemia, unspecified: Secondary | ICD-10-CM | POA: Diagnosis not present

## 2024-01-18 DIAGNOSIS — J449 Chronic obstructive pulmonary disease, unspecified: Secondary | ICD-10-CM | POA: Diagnosis not present

## 2024-01-18 NOTE — Addendum Note (Signed)
Addended by: Sharee Holster on: 01/18/2024 12:36 PM   Modules accepted: Level of Service

## 2024-01-18 NOTE — Progress Notes (Signed)
. Location:  [facility]   Place of Service:   [facility]   CODE STATUS: Full (per prior)  Allergies  Allergen Reactions   Benzyl Alcohol Itching   Amlodipine Swelling    Swelling in feet   Aspirin Other (See Comments)    Upset stomach.   Other Hypertension    Hazelnuts   Penicillins Hives    Has patient had a PCN reaction causing immediate rash, facial/tongue/throat swelling, SOB or lightheadedness with hypotension: Yes Has patient had a PCN reaction causing severe rash involving mucus membranes or skin necrosis: No Has patient had a PCN reaction that required hospitalization No Has patient had a PCN reaction occurring within the last 10 years: No If all of the above answers are "NO", then may proceed with Cephalosporin use.    Sulfa Antibiotics Hives   Tylenol [Acetaminophen] Other (See Comments)    Pt reports he has had liver issues and was told not to take tylenol     No chief complaint on file.   HPI:  Michael Evans is an 83yo M w/ hx of COPD, HTN, hypothyroid, T2DM that p/f nursing home visit. - Pt reports having SOB from his COPD and coughing up yellow phlegm. This has been ongoing since he came to this facility, and has not recently changed. Reports taking his Breztri daily. - Pt is also requesting for CBG checks to be made less often. He is having discomfort in his fingers.    Past Medical History:  Diagnosis Date   Arthritis    Chronic rhinitis    Complication of anesthesia    patient usually hs to be intubated with anesthesia   COPD (chronic obstructive pulmonary disease) (HCC)    PFT 06-26-09 FEV1  1.75( 71%), FVC 3.65( 101%), FEV1% 48, TLC 5.53(104%), DLCO 55%, no BD   Dyspnea    Hyperlipidemia    Hypertension    Nasal septal deviation    Nocturia    On home O2    2L N/C   OSA (obstructive sleep apnea) 04/21/2011   Partial small bowel obstruction (HCC) 10/18/2021   Skin cancer     Past Surgical History:  Procedure Laterality Date    APPENDECTOMY     BACK SURGERY     CATARACT EXTRACTION W/PHACO  01/06/2013   Procedure: CATARACT EXTRACTION PHACO AND INTRAOCULAR LENS PLACEMENT (IOC);  Surgeon: Gemma Payor, MD;  Location: [facility];  Service: Ophthalmology;  Laterality: Right;  CDE: 22.17   CATARACT EXTRACTION W/PHACO Left 06/20/2022   Procedure: CATARACT EXTRACTION PHACO AND INTRAOCULAR LENS PLACEMENT (IOC);  Surgeon: Fabio Pierce, MD;  Location: [facility];  Service: Ophthalmology;  Laterality: Left;  CDE 20.85   FEMORAL HERNIA REPAIR Right 11/19/2022   Procedure: HERNIA REPAIR FEMORAL WITH MESH;  Surgeon: Lucretia Roers, MD;  Location: [facility];  Service: General;  Laterality: Right;   INGUINAL HERNIA REPAIR Left 12/09/2021   Procedure: HERNIA REPAIR INGUINAL ADULT WITH MESH;  Surgeon: Lucretia Roers, MD;  Location: [facility];  Service: General;  Laterality: Left;   INGUINAL HERNIA REPAIR Right 03/19/2022   Procedure: HERNIA REPAIR INGUINAL ADULT WITH MESH;  Surgeon: Lucretia Roers, MD;  Location: [facility];  Service: General;  Laterality: Right;   ORIF PATELLA Left 01/22/2023   Procedure: OPEN REDUCTION INTERNAL FIXATION (ORIF) PATELLA;  Surgeon: [name], MD;  Location: [facility];  Service: Orthopedics;  Laterality: Left;    Social History   Socioeconomic History   Marital status: Divorced  Spouse name: Not on file   Number of children: 3   Years of education: Not on file   Highest education level: Not on file  Occupational History   Occupation: Armed forces technical officer: RETIRED  Tobacco Use   Smoking status: Former    Current packs/day: 0.00    Average packs/day: 1.5 packs/day for 54.0 years (81.0 ttl pk-yrs)    Types: Cigarettes    Start date: 12/02/1947    Quit date: 12/01/2001    Years since quitting: 22.1   Smokeless tobacco: Never  Vaping Use   Vaping status: Never Used  Substance and Sexual Activity   Alcohol use: No   Drug use: No   Sexual activity: Not on file  Other Topics Concern   Not on file   Social History Narrative   Lives with sister.    Social Drivers of Corporate investment banker Strain: Low Risk  (02/07/2022)   Overall Financial Resource Strain (CARDIA)    Difficulty of Paying Living Expenses: Not hard at all  Food Insecurity: Low Risk  (08/06/2023)   Received from [facility]   Hunger Vital Sign    Worried About Running Out of Food in the Last Year: Never true    Ran Out of Food in the Last Year: Never true  Transportation Needs: No Transportation Needs (08/06/2023)   Received from [facility]    In the past 12 months, has lack of reliable transportation kept you from medical appointments, meetings, work or from getting things needed for daily living? : No  Physical Activity: Insufficiently Active (02/07/2022)   Exercise Vital Sign    Days of Exercise per Week: 3 days    Minutes of Exercise per Session: 20 min  Stress: No Stress Concern Present (02/07/2022)   [employer] of Occupational Health - Occupational Stress Questionnaire    Feeling of Stress : Not at all  Social Connections: Socially Isolated (02/07/2022)   Social Connection and Isolation Panel [NHANES]    Frequency of Communication with Friends and Family: More than three times a week    Frequency of Social Gatherings with Friends and Family: More than three times a week    Attends Religious Services: Never    Database administrator or Organizations: No    Attends Banker Meetings: Never    Marital Status: Divorced  Catering manager Violence: Not At Risk (01/20/2023)   Humiliation, Afraid, Rape, and Kick questionnaire    Fear of Current or Ex-Partner: No    Emotionally Abused: No    Physically Abused: No    Sexually Abused: No   Family History  Problem Relation Age of Onset   Lung cancer Mother       VITAL SIGNS There were no vitals taken for this visit.  Outpatient Encounter Medications as of 01/18/2024  Medication Sig   albuterol (VENTOLIN HFA) 108 (90  Base) MCG/ACT inhaler Inhale 2 puffs into the lungs every 6 (six) hours as needed for wheezing or shortness of breath. (Patient not taking: Reported on 11/11/2023)   Albuterol Sulfate 2.5 MG/0.5ML NEBU Inhale 1 each into the lungs 3 (three) times daily.   ascorbic acid (VITAMIN C) 500 MG tablet Take 1,000 mg by mouth daily.   Budeson-Glycopyrrol-Formoterol (BREZTRI AEROSPHERE) 160-9-4.8 MCG/ACT AERO Inhale 2 puffs into the lungs in the morning and at bedtime.   cholecalciferol (VITAMIN D) 25 MCG (1000 UNIT) tablet Take 3,000 Units by mouth daily.  feeding supplement (ENSURE ENLIVE / ENSURE PLUS) LIQD Take 237 mLs by mouth 3 (three) times daily between meals.   ferrous sulfate 325 (65 FE) MG EC tablet Take 325 mg by mouth every Monday, Wednesday, and Friday.   finasteride (PROSCAR) 5 MG tablet Take 1 tablet (5 mg total) by mouth daily.   levothyroxine (SYNTHROID) 50 MCG tablet Take 50 mcg by mouth daily before breakfast.   losartan (COZAAR) 25 MG tablet Take 12.5 mg by mouth daily.   methocarbamol (ROBAXIN) 500 MG tablet Take 500 mg by mouth every 8 (eight) hours as needed for muscle spasms.   OXYGEN Inhale 2 L into the lungs as directed. Every Shift; Day, Evening, Night   tamsulosin (FLOMAX) 0.4 MG CAPS capsule Take 2 capsules (0.8 mg total) by mouth daily.   UNABLE TO FIND Diet: Dysphagia 2/Thin Liquids, Consistent carbohydrate   No facility-administered encounter medications on file as of 01/18/2024.   General: Alert, chronically-ill, pleasant man. NAD. HEENT: NCAT. MMM. CV: RRR, no murmurs. Resp: CTAB, no wheezing or crackles. Normal WOB on 2L [place]. PT showed a cup of his phlegm, it appears yellowish-clear. Ext: Able to wheel himself around the room easily Skin: Warm, well perfused    ASSESSMENT/ PLAN:  Assessment & Plan Type 2 diabetes mellitus with other specified complication, without long-term current use of insulin (HCC) A1c 7.1 01/01/24, goal is <8. Diet controlled. Since pt is  controlled and not requiring insulin, it is appropriate to stop CBG checks. - CBG prn if concern for hypoglycemia. Stop daily CBG checks. - A1c in 2 months Primary hypertension At goal. Continue losartan.  Chronic obstructive pulmonary disease, unspecified COPD type (HCC) Chronic, controlled on Breztri. On baseline 2L [place], lung without wheezing. Not requiring albuterol prn. No concern for acute exacerbation at this time.  - Continue Breztri BID - Continue albuterol prn. Encouraged to ask for this if he feels SOB to see if it helps   Synthia Innocent NP [facility]  Contact [phone] Monday through Friday 8am- 5pm  After hours call [phone]

## 2024-02-08 ENCOUNTER — Non-Acute Institutional Stay (SKILLED_NURSING_FACILITY): Payer: Self-pay | Admitting: Adult Health

## 2024-02-08 DIAGNOSIS — N401 Enlarged prostate with lower urinary tract symptoms: Secondary | ICD-10-CM

## 2024-02-08 DIAGNOSIS — K5909 Other constipation: Secondary | ICD-10-CM

## 2024-02-08 DIAGNOSIS — N138 Other obstructive and reflux uropathy: Secondary | ICD-10-CM | POA: Diagnosis not present

## 2024-02-08 DIAGNOSIS — E43 Unspecified severe protein-calorie malnutrition: Secondary | ICD-10-CM | POA: Diagnosis not present

## 2024-02-09 ENCOUNTER — Encounter: Payer: Self-pay | Admitting: Adult Health

## 2024-02-09 NOTE — Progress Notes (Unsigned)
 Location:  [facility] Room Number: [id number] Place of Service:  SNF ([id number])   CODE STATUS: full   Allergies  Allergen Reactions   Benzyl Alcohol Itching   Amlodipine Swelling    Swelling in feet   Aspirin Other (See Comments)    Upset stomach.   Other Hypertension    Hazelnuts   Penicillins Hives    Has patient had a PCN reaction causing immediate rash, facial/tongue/throat swelling, SOB or lightheadedness with hypotension: Yes Has patient had a PCN reaction causing severe rash involving mucus membranes or skin necrosis: No Has patient had a PCN reaction that required hospitalization No Has patient had a PCN reaction occurring within the last 10 years: No If all of the above answers are "NO", then may proceed with Cephalosporin use.    Sulfa Antibiotics Hives   Tylenol [Acetaminophen] Other (See Comments)    Pt reports he has had liver issues and was told not to take tylenol     Chief Complaint  Patient presents with   Medical Management of Chronic Issues           BPH with obstruction urinary symptoms: Protein calorie malnutrition, severe:  Chronic constipation     HPI:  He is a 83 year old long term resident of this facility being seen for the management of his chronic illnesses: BPH with obstruction urinary symptoms: Protein calorie malnutrition, severe:  Chronic constipation. There are no reports of uncontrolled pain. There are no changes in his respiratory status; and his weight is without change.   Past Medical History:  Diagnosis Date   Arthritis    Chronic rhinitis    Complication of anesthesia    patient usually hs to be intubated with anesthesia   COPD (chronic obstructive pulmonary disease) (HCC)    PFT 06-26-09 FEV1  1.75( 71%), FVC 3.65( 101%), FEV1% 48, TLC 5.53(104%), DLCO 55%, no BD   Dyspnea    Hyperlipidemia    Hypertension    Nasal septal deviation    Nocturia    On home O2    2L N/C   OSA (obstructive sleep apnea) 04/21/2011    Partial small bowel obstruction (HCC) 10/18/2021   Skin cancer     Past Surgical History:  Procedure Laterality Date   APPENDECTOMY     BACK SURGERY     CATARACT EXTRACTION W/PHACO  01/06/2013   Procedure: CATARACT EXTRACTION PHACO AND INTRAOCULAR LENS PLACEMENT (IOC);  Surgeon: Gemma Payor, MD;  Location: [facility];  Service: Ophthalmology;  Laterality: Right;  CDE: 22.17   CATARACT EXTRACTION W/PHACO Left 06/20/2022   Procedure: CATARACT EXTRACTION PHACO AND INTRAOCULAR LENS PLACEMENT (IOC);  Surgeon: Fabio Pierce, MD;  Location: [facility];  Service: Ophthalmology;  Laterality: Left;  CDE 20.85   FEMORAL HERNIA REPAIR Right 11/19/2022   Procedure: HERNIA REPAIR FEMORAL WITH MESH;  Surgeon: Lucretia Roers, MD;  Location: [facility];  Service: General;  Laterality: Right;   INGUINAL HERNIA REPAIR Left 12/09/2021   Procedure: HERNIA REPAIR INGUINAL ADULT WITH MESH;  Surgeon: Lucretia Roers, MD;  Location: [facility];  Service: General;  Laterality: Left;   INGUINAL HERNIA REPAIR Right 03/19/2022   Procedure: HERNIA REPAIR INGUINAL ADULT WITH MESH;  Surgeon: Lucretia Roers, MD;  Location: [facility];  Service: General;  Laterality: Right;   ORIF PATELLA Left 01/22/2023   Procedure: OPEN REDUCTION INTERNAL FIXATION (ORIF) PATELLA;  Surgeon: [name], MD;  Location: [facility];  Service: Orthopedics;  Laterality:  Left;    Social History   Socioeconomic History   Marital status: Divorced    Spouse name: Not on file   Number of children: 3   Years of education: Not on file   Highest education level: Not on file  Occupational History   Occupation: Armed forces technical officer: RETIRED  Tobacco Use   Smoking status: Former    Current packs/day: 0.00    Average packs/day: 1.5 packs/day for 54.0 years (81.0 ttl pk-yrs)    Types: Cigarettes    Start date: 12/02/1947    Quit date: 12/01/2001    Years since quitting: 22.2   Smokeless tobacco: Never  Vaping Use   Vaping status: Never Used  Substance  and Sexual Activity   Alcohol use: No   Drug use: No   Sexual activity: Not on file  Other Topics Concern   Not on file  Social History Narrative   Lives with sister.    Social Drivers of Corporate investment banker Strain: Low Risk  (02/07/2022)   Overall Financial Resource Strain (CARDIA)    Difficulty of Paying Living Expenses: Not hard at all  Food Insecurity: Low Risk  (08/06/2023)   Received from [facility]   Hunger Vital Sign    Worried About Running Out of Food in the Last Year: Never true    Ran Out of Food in the Last Year: Never true  Transportation Needs: No Transportation Needs (08/06/2023)   Received from [facility]    In the past 12 months, has lack of reliable transportation kept you from medical appointments, meetings, work or from getting things needed for daily living? : No  Physical Activity: Insufficiently Active (02/07/2022)   Exercise Vital Sign    Days of Exercise per Week: 3 days    Minutes of Exercise per Session: 20 min  Stress: No Stress Concern Present (02/07/2022)   [employer] of Occupational Health - Occupational Stress Questionnaire    Feeling of Stress : Not at all  Social Connections: Socially Isolated (02/07/2022)   Social Connection and Isolation Panel [NHANES]    Frequency of Communication with Friends and Family: More than three times a week    Frequency of Social Gatherings with Friends and Family: More than three times a week    Attends Religious Services: Never    Database administrator or Organizations: No    Attends Banker Meetings: Never    Marital Status: Divorced  Catering manager Violence: Not At Risk (01/20/2023)   Humiliation, Afraid, Rape, and Kick questionnaire    Fear of Current or Ex-Partner: No    Emotionally Abused: No    Physically Abused: No    Sexually Abused: No   Family History  Problem Relation Age of Onset   Lung cancer Mother       VITAL SIGNS BP 132/68   Pulse 74    Temp (!) 96.5 F (35.8 C)   Resp 20   Ht 5' 6.5" (1.689 m)   Wt 145 lb 3.2 oz (65.9 kg)   SpO2 96%   BMI 23.08 kg/m   Outpatient Encounter Medications as of 02/08/2024  Medication Sig   albuterol (VENTOLIN HFA) 108 (90 Base) MCG/ACT inhaler Inhale 2 puffs into the lungs every 6 (six) hours as needed for wheezing or shortness of breath. (Patient not taking: Reported on 11/11/2023)   Albuterol Sulfate 2.5 MG/0.5ML NEBU Inhale 1 each into the lungs 3 (three) times  daily.   ascorbic acid (VITAMIN C) 500 MG tablet Take 1,000 mg by mouth daily.   Budeson-Glycopyrrol-Formoterol (BREZTRI AEROSPHERE) 160-9-4.8 MCG/ACT AERO Inhale 2 puffs into the lungs in the morning and at bedtime.   cholecalciferol (VITAMIN D) 25 MCG (1000 UNIT) tablet Take 3,000 Units by mouth daily.   feeding supplement (ENSURE ENLIVE / ENSURE PLUS) LIQD Take 237 mLs by mouth 3 (three) times daily between meals.   ferrous sulfate 325 (65 FE) MG EC tablet Take 325 mg by mouth every Monday, Wednesday, and Friday.   finasteride (PROSCAR) 5 MG tablet Take 1 tablet (5 mg total) by mouth daily.   levothyroxine (SYNTHROID) 50 MCG tablet Take 50 mcg by mouth daily before breakfast.   losartan (COZAAR) 25 MG tablet Take 12.5 mg by mouth daily.   methocarbamol (ROBAXIN) 500 MG tablet Take 500 mg by mouth every 8 (eight) hours as needed for muscle spasms.   OXYGEN Inhale 2 L into the lungs as directed. Every Shift; Day, Evening, Night   tamsulosin (FLOMAX) 0.4 MG CAPS capsule Take 2 capsules (0.8 mg total) by mouth daily.   UNABLE TO FIND Diet: Dysphagia 2/Thin Liquids, Consistent carbohydrate   No facility-administered encounter medications on file as of 02/08/2024.     SIGNIFICANT DIAGNOSTIC EXAMS   LABS REVIEWED; PREVIOUS   03-12-23 wbc 7.8; hgb 12.3; hct 38.6; mcv 91.3 plt 289; glucose 114; bun 24; creat 0.89; k+ 4.0; na++ 135; ca 9.1 gfr >60; protein 6.6 albumin 3.1 tsh 3.506; vitamin B 12: 1393 rpr: nr  09-28-23: wbc 9.6; hgb  16.0; hgb 50.4 mcv 95.1 plt 251 glucose 141; bun 28; creat 0.83; k+ 3.9; na++ 137; ca 9.6 gfr >60; protein 7.1; albumin 3.7; hgb A1c 6.9 iron 93 tibc 436; chol 258; ldl 174; trig 163; hdl 51 psa 0.6 tsh 6.055; vitamin D 62.23; ACR 10.9 11-09-23: tsh 3.912 free t4: 1.17 11-27-23: glucose 131; bun 23; creat 0.70; k+ 3.8; na++ 134; ca 9.0 gfr >60   TODAY  12-31-22: hgb A1c 7.1; chol 203; ldl 126; trig 187; hdl 40    Review of Systems  Constitutional:  Negative for malaise/fatigue.  Respiratory:  Negative for cough and shortness of breath.   Cardiovascular:  Negative for chest pain, palpitations and leg swelling.  Gastrointestinal:  Negative for abdominal pain, constipation and heartburn.  Musculoskeletal:  Negative for back pain, joint pain and myalgias.  Skin: Negative.   Neurological:  Negative for dizziness.  Psychiatric/Behavioral:  The patient is not nervous/anxious.    Physical Exam Constitutional:      General: He is not in acute distress.    Appearance: He is well-developed. He is not diaphoretic.  Neck:     Thyroid: No thyromegaly.  Cardiovascular:     Rate and Rhythm: Normal rate and regular rhythm.     Pulses: Normal pulses.     Heart sounds: Normal heart sounds.  Pulmonary:     Effort: Pulmonary effort is normal. No respiratory distress.     Breath sounds: Normal breath sounds.  Abdominal:     General: Bowel sounds are normal. There is no distension.     Palpations: Abdomen is soft.     Tenderness: There is no abdominal tenderness.  Musculoskeletal:        General: Normal range of motion.     Cervical back: Neck supple.     Right lower leg: No edema.     Left lower leg: No edema.  Lymphadenopathy:     Cervical:  No cervical adenopathy.  Skin:    General: Skin is warm and dry.  Neurological:     Mental Status: He is alert. Mental status is at baseline.     Comments:  SLUMS: 15/30     Psychiatric:        Mood and Affect: Mood normal.     ASSESSMENT/  PLAN:  TODAY  BPH with obstruction urinary symptoms: will continue proscar 5 mg daily and flomax 0.8 mg daily   2. Protein calorie malnutrition, severe: protein 5.6 albumin 2.5 will continue supplements as directed  3. Chronic constipation: is presently not on medications.   PREVIOUS   5. Iron deficiency anemia: hgb 12.3; iron 19; will continue iron three times weekly;   6. Type 2 diabetes with hyperglycemia without long term current use of insulin: steroid induced; hgb A1c 6.9 will monitor  7. Vascular dementia without behavioral disturbance: weight is 143 pounds will monitor   8. Aortic atherosclerosis (ct 05-23-22) not on statin due to advanced age.   9. Hypertension associated with type 2 diabetes mellitus: b/p 132/68; is on asa 81 mg daily   10. Pulmonary emphysema unspecified emphysema / chronic respiratory failure with hpyoxia: has 81 year pack history. Will continue breztri 160-9-4.8 mcg 2 puffs twice daily; has albuterol neg or 2 puffs every 6 hours as needed  11. Gastroesophageal reflux disease: is off PPI     Synthia Innocent NP [facility]   call [phone]

## 2024-02-29 ENCOUNTER — Non-Acute Institutional Stay: Payer: Self-pay | Admitting: Student

## 2024-02-29 DIAGNOSIS — J439 Emphysema, unspecified: Secondary | ICD-10-CM

## 2024-02-29 NOTE — Progress Notes (Signed)
 Location:  [facility]   Place of Service:      CODE STATUS: Full  Allergies  Allergen Reactions   Benzyl Alcohol Itching   Amlodipine Swelling    Swelling in feet   Aspirin Other (See Comments)    Upset stomach.   Other Hypertension    Hazelnuts   Penicillins Hives    Has patient had a PCN reaction causing immediate rash, facial/tongue/throat swelling, SOB or lightheadedness with hypotension: Yes Has patient had a PCN reaction causing severe rash involving mucus membranes or skin necrosis: No Has patient had a PCN reaction that required hospitalization No Has patient had a PCN reaction occurring within the last 10 years: No If all of the above answers are "NO", then may proceed with Cephalosporin use.    Sulfa Antibiotics Hives   Tylenol [Acetaminophen] Other (See Comments)    Pt reports he has had liver issues and was told not to take tylenol     No chief complaint on file.   HPI:  Pt with COPD on baseline of 2 L O2 presents with worsening SOB and needing more help with daily activities such as dressing and transfers.  He is currently taking Breztri BID and is prescribed albuterol to use TID as needed which he has not received.  Vitals reviewed and have been stable including RR, HR, BP and SPO2 has been between low to high 90's  Today pt admits to productive cough with yellow sputum which he states is at its baseline and has not increased. Note SOB with exertion. He denies any chest pain, fever/chills or feeling sick.     Past Medical History:  Diagnosis Date   Arthritis    Chronic rhinitis    Complication of anesthesia    patient usually hs to be intubated with anesthesia   COPD (chronic obstructive pulmonary disease) (HCC)    PFT 06-26-09 FEV1  1.75( 71%), FVC 3.65( 101%), FEV1% 48, TLC 5.53(104%), DLCO 55%, no BD   Dyspnea    Hyperlipidemia    Hypertension    Nasal septal deviation    Nocturia    On home O2    2L N/C   OSA (obstructive sleep  apnea) 04/21/2011   Partial small bowel obstruction (HCC) 10/18/2021   Skin cancer     Past Surgical History:  Procedure Laterality Date   APPENDECTOMY     BACK SURGERY     CATARACT EXTRACTION W/PHACO  01/06/2013   Procedure: CATARACT EXTRACTION PHACO AND INTRAOCULAR LENS PLACEMENT (IOC);  Surgeon: Gemma Payor, MD;  Location: [facility];  Service: Ophthalmology;  Laterality: Right;  CDE: 22.17   CATARACT EXTRACTION W/PHACO Left 06/20/2022   Procedure: CATARACT EXTRACTION PHACO AND INTRAOCULAR LENS PLACEMENT (IOC);  Surgeon: Fabio Pierce, MD;  Location: [facility];  Service: Ophthalmology;  Laterality: Left;  CDE 20.85   FEMORAL HERNIA REPAIR Right 11/19/2022   Procedure: HERNIA REPAIR FEMORAL WITH MESH;  Surgeon: Lucretia Roers, MD;  Location: [facility];  Service: General;  Laterality: Right;   INGUINAL HERNIA REPAIR Left 12/09/2021   Procedure: HERNIA REPAIR INGUINAL ADULT WITH MESH;  Surgeon: Lucretia Roers, MD;  Location: [facility];  Service: General;  Laterality: Left;   INGUINAL HERNIA REPAIR Right 03/19/2022   Procedure: HERNIA REPAIR INGUINAL ADULT WITH MESH;  Surgeon: Lucretia Roers, MD;  Location: [facility];  Service: General;  Laterality: Right;   ORIF PATELLA Left 01/22/2023   Procedure: OPEN REDUCTION INTERNAL FIXATION (ORIF) PATELLA;  Surgeon: Thane Edu  A, MD;  Location: [facility];  Service: Orthopedics;  Laterality: Left;    Social History   Socioeconomic History   Marital status: Divorced    Spouse name: Not on file   Number of children: 3   Years of education: Not on file   Highest education level: Not on file  Occupational History   Occupation: Armed forces technical officer: RETIRED  Tobacco Use   Smoking status: Former    Current packs/day: 0.00    Average packs/day: 1.5 packs/day for 54.0 years (81.0 ttl pk-yrs)    Types: Cigarettes    Start date: 12/02/1947    Quit date: 12/01/2001    Years since quitting: 22.2   Smokeless tobacco: Never  Vaping Use   Vaping status: Never  Used  Substance and Sexual Activity   Alcohol use: No   Drug use: No   Sexual activity: Not on file  Other Topics Concern   Not on file  Social History Narrative   Lives with sister.    Social Drivers of Corporate investment banker Strain: Low Risk  (02/07/2022)   Overall Financial Resource Strain (CARDIA)    Difficulty of Paying Living Expenses: Not hard at all  Food Insecurity: Low Risk  (08/06/2023)   Received from [facility]   Hunger Vital Sign    Worried About Running Out of Food in the Last Year: Never true    Ran Out of Food in the Last Year: Never true  Transportation Needs: No Transportation Needs (08/06/2023)   Received from [facility]    In the past 12 months, has lack of reliable transportation kept you from medical appointments, meetings, work or from getting things needed for daily living? : No  Physical Activity: Insufficiently Active (02/07/2022)   Exercise Vital Sign    Days of Exercise per Week: 3 days    Minutes of Exercise per Session: 20 min  Stress: No Stress Concern Present (02/07/2022)   [employer] of Occupational Health - Occupational Stress Questionnaire    Feeling of Stress : Not at all  Social Connections: Socially Isolated (02/07/2022)   Social Connection and Isolation Panel [NHANES]    Frequency of Communication with Friends and Family: More than three times a week    Frequency of Social Gatherings with Friends and Family: More than three times a week    Attends Religious Services: Never    Database administrator or Organizations: No    Attends Banker Meetings: Never    Marital Status: Divorced  Catering manager Violence: Not At Risk (01/20/2023)   Humiliation, Afraid, Rape, and Kick questionnaire    Fear of Current or Ex-Partner: No    Emotionally Abused: No    Physically Abused: No    Sexually Abused: No   Family History  Problem Relation Age of Onset   Lung cancer Mother       VITAL SIGNS There  were no vitals taken for this visit.  Outpatient Encounter Medications as of 02/29/2024  Medication Sig   albuterol (VENTOLIN HFA) 108 (90 Base) MCG/ACT inhaler Inhale 2 puffs into the lungs every 6 (six) hours as needed for wheezing or shortness of breath. (Patient not taking: Reported on 11/11/2023)   Albuterol Sulfate 2.5 MG/0.5ML NEBU Inhale 1 each into the lungs 3 (three) times daily.   ascorbic acid (VITAMIN C) 500 MG tablet Take 1,000 mg by mouth daily.   Budeson-Glycopyrrol-Formoterol (BREZTRI AEROSPHERE) 160-9-4.8 MCG/ACT AERO Inhale  2 puffs into the lungs in the morning and at bedtime.   cholecalciferol (VITAMIN D) 25 MCG (1000 UNIT) tablet Take 3,000 Units by mouth daily.   feeding supplement (ENSURE ENLIVE / ENSURE PLUS) LIQD Take 237 mLs by mouth 3 (three) times daily between meals.   ferrous sulfate 325 (65 FE) MG EC tablet Take 325 mg by mouth every Monday, Wednesday, and Friday.   finasteride (PROSCAR) 5 MG tablet Take 1 tablet (5 mg total) by mouth daily.   levothyroxine (SYNTHROID) 50 MCG tablet Take 50 mcg by mouth daily before breakfast.   losartan (COZAAR) 25 MG tablet Take 12.5 mg by mouth daily.   methocarbamol (ROBAXIN) 500 MG tablet Take 500 mg by mouth every 8 (eight) hours as needed for muscle spasms.   OXYGEN Inhale 2 L into the lungs as directed. Every Shift; Day, Evening, Night   tamsulosin (FLOMAX) 0.4 MG CAPS capsule Take 2 capsules (0.8 mg total) by mouth daily.   UNABLE TO FIND Diet: Dysphagia 2/Thin Liquids, Consistent carbohydrate   No facility-administered encounter medications on file as of 02/29/2024.     SIGNIFICANT DIAGNOSTIC EXAMS   General: sitting up, NAD, pleasant Cardio: RRR Lungs: diminished throughout with rhonchi at bilateral bases (R>L), breathing comfortably on 2 L Psych: mood and affect appropriate  Extremities: No edema BLEs    ASSESSMENT/ PLAN:  COPD No evidence of exacerbation.  Symptoms most likely related to chronic and  worsening COPD.  Reassuringly, he is breathing comfortably on baseline of 2 L with stable vitals.  Has nasal congestion and a lot of mucus causing cough and rhonchi.  Add Flonase 2 sprays each nostril daily.  If this does not help, can consider adding azelastine nasal spray.   [facility], PGY-3

## 2024-03-01 NOTE — Addendum Note (Signed)
 Addended by: Sharee Holster on: 03/01/2024 10:05 AM   Modules accepted: Level of Service

## 2024-03-08 DIAGNOSIS — J9611 Chronic respiratory failure with hypoxia: Secondary | ICD-10-CM | POA: Diagnosis not present

## 2024-03-08 DIAGNOSIS — R498 Other voice and resonance disorders: Secondary | ICD-10-CM | POA: Diagnosis not present

## 2024-03-08 DIAGNOSIS — J449 Chronic obstructive pulmonary disease, unspecified: Secondary | ICD-10-CM | POA: Diagnosis not present

## 2024-03-09 DIAGNOSIS — J9611 Chronic respiratory failure with hypoxia: Secondary | ICD-10-CM | POA: Diagnosis not present

## 2024-03-09 DIAGNOSIS — J449 Chronic obstructive pulmonary disease, unspecified: Secondary | ICD-10-CM | POA: Diagnosis not present

## 2024-03-09 DIAGNOSIS — R498 Other voice and resonance disorders: Secondary | ICD-10-CM | POA: Diagnosis not present

## 2024-03-10 ENCOUNTER — Non-Acute Institutional Stay (SKILLED_NURSING_FACILITY): Payer: Self-pay | Admitting: Adult Health

## 2024-03-10 ENCOUNTER — Encounter: Payer: Self-pay | Admitting: Adult Health

## 2024-03-10 DIAGNOSIS — J9611 Chronic respiratory failure with hypoxia: Secondary | ICD-10-CM | POA: Diagnosis not present

## 2024-03-10 DIAGNOSIS — E1165 Type 2 diabetes mellitus with hyperglycemia: Secondary | ICD-10-CM

## 2024-03-10 DIAGNOSIS — D509 Iron deficiency anemia, unspecified: Secondary | ICD-10-CM | POA: Diagnosis not present

## 2024-03-10 DIAGNOSIS — R498 Other voice and resonance disorders: Secondary | ICD-10-CM | POA: Diagnosis not present

## 2024-03-10 DIAGNOSIS — J449 Chronic obstructive pulmonary disease, unspecified: Secondary | ICD-10-CM | POA: Diagnosis not present

## 2024-03-10 DIAGNOSIS — F0150 Vascular dementia without behavioral disturbance: Secondary | ICD-10-CM

## 2024-03-10 NOTE — Progress Notes (Signed)
 Location:  [facility] Room Number: 107 Place of Service:  SNF (31)   CODE STATUS: full   Allergies  Allergen Reactions   Benzyl Alcohol Itching   Amlodipine Swelling    Swelling in feet   Aspirin Other (See Comments)    Upset stomach.   Other Hypertension    Hazelnuts   Penicillins Hives    Has patient had a PCN reaction causing immediate rash, facial/tongue/throat swelling, SOB or lightheadedness with hypotension: Yes Has patient had a PCN reaction causing severe rash involving mucus membranes or skin necrosis: No Has patient had a PCN reaction that required hospitalization No Has patient had a PCN reaction occurring within the last 10 years: No If all of the above answers are "NO", then may proceed with Cephalosporin use.    Sulfa Antibiotics Hives   Tylenol [Acetaminophen] Other (See Comments)    Pt reports he has had liver issues and was told not to take tylenol     Chief Complaint  Patient presents with   Medical Management of Chronic Issues          Iron deficiency anemia  Controlled type 2 diabetes mellitus with hyperglycemia Vascular dementia without behavioral disturbance      HPI:  He is a 83 y.o. long term resident of this facility being seen for the management of his chronic illnesses: Iron deficiency anemia  Controlled type 2 diabetes mellitus with hyperglycemia Vascular dementia without behavioral disturbance. There are no reports of uncontrolled pain. Thre are no reports of anxiety or depressive thoughts.    Past Medical History:  Diagnosis Date   Arthritis    Chronic rhinitis    Complication of anesthesia    patient usually hs to be intubated with anesthesia   COPD (chronic obstructive pulmonary disease) (HCC)    PFT 06-26-09 FEV1  1.75( 71%), FVC 3.65( 101%), FEV1% 48, TLC 5.53(104%), DLCO 55%, no BD   Dyspnea    Hyperlipidemia    Hypertension    Nasal septal deviation    Nocturia    On home O2    2L N/C   OSA  (obstructive sleep apnea) 04/21/2011   Partial small bowel obstruction (HCC) 10/18/2021   Skin cancer     Past Surgical History:  Procedure Laterality Date   APPENDECTOMY     BACK SURGERY     CATARACT EXTRACTION W/PHACO  01/06/2013   Procedure: CATARACT EXTRACTION PHACO AND INTRAOCULAR LENS PLACEMENT (IOC);  Surgeon: Anner Kill, MD;  Location: [facility];  Service: Ophthalmology;  Laterality: Right;  CDE: 22.17   CATARACT EXTRACTION W/PHACO Left 06/20/2022   Procedure: CATARACT EXTRACTION PHACO AND INTRAOCULAR LENS PLACEMENT (IOC);  Surgeon: Tarri Farm, MD;  Location: [facility];  Service: Ophthalmology;  Laterality: Left;  CDE 20.85   FEMORAL HERNIA REPAIR Right 11/19/2022   Procedure: HERNIA REPAIR FEMORAL WITH MESH;  Surgeon: Awilda Bogus, MD;  Location: [facility];  Service: General;  Laterality: Right;   INGUINAL HERNIA REPAIR Left 12/09/2021   Procedure: HERNIA REPAIR INGUINAL ADULT WITH MESH;  Surgeon: Awilda Bogus, MD;  Location: [facility];  Service: General;  Laterality: Left;   INGUINAL HERNIA REPAIR Right 03/19/2022   Procedure: HERNIA REPAIR INGUINAL ADULT WITH MESH;  Surgeon: Awilda Bogus, MD;  Location: [facility];  Service: General;  Laterality: Right;   ORIF PATELLA Left 01/22/2023   Procedure: OPEN REDUCTION INTERNAL FIXATION (ORIF) PATELLA;  Surgeon: Tonita Frater, MD;  Location: [facility];  Service: Orthopedics;  Laterality: Left;    Social History   Socioeconomic History   Marital status: Divorced    Spouse name: Not on file   Number of children: 3   Years of education: Not on file   Highest education level: Not on file  Occupational History   Occupation: Armed forces technical officer: RETIRED  Tobacco Use   Smoking status: Former    Current packs/day: 0.00    Average packs/day: 1.5 packs/day for 54.0 years (81.0 ttl pk-yrs)    Types: Cigarettes    Start date: 12/02/1947    Quit date: 12/01/2001    Years since quitting: 22.2   Smokeless tobacco: Never  Vaping Use    Vaping status: Never Used  Substance and Sexual Activity   Alcohol use: No   Drug use: No   Sexual activity: Not on file  Other Topics Concern   Not on file  Social History Narrative   Lives with sister.    Social Drivers of Corporate investment banker Strain: Low Risk  (02/07/2022)   Overall Financial Resource Strain (CARDIA)    Difficulty of Paying Living Expenses: Not hard at all  Food Insecurity: Low Risk  (08/06/2023)   Received from [facility]   Hunger Vital Sign    Worried About Running Out of Food in the Last Year: Never true    Ran Out of Food in the Last Year: Never true  Transportation Needs: No Transportation Needs (08/06/2023)   Received from [facility]    In the past 12 months, has lack of reliable transportation kept you from medical appointments, meetings, work or from getting things needed for daily living? : No  Physical Activity: Insufficiently Active (02/07/2022)   Exercise Vital Sign    Days of Exercise per Week: 3 days    Minutes of Exercise per Session: 20 min  Stress: No Stress Concern Present (02/07/2022)   [employer] of Occupational Health - Occupational Stress Questionnaire    Feeling of Stress : Not at all  Social Connections: Socially Isolated (02/07/2022)   Social Connection and Isolation Panel [NHANES]    Frequency of Communication with Friends and Family: More than three times a week    Frequency of Social Gatherings with Friends and Family: More than three times a week    Attends Religious Services: Never    Database administrator or Organizations: No    Attends Banker Meetings: Never    Marital Status: Divorced  Catering manager Violence: Not At Risk (01/20/2023)   Humiliation, Afraid, Rape, and Kick questionnaire    Fear of Current or Ex-Partner: No    Emotionally Abused: No    Physically Abused: No    Sexually Abused: No   Family History  Problem Relation Age of Onset   Lung cancer Mother        VITAL SIGNS BP 138/68   Pulse 88   Temp 97.8 F (36.6 C)   Resp 16   Ht 5\' 6"  (1.676 m)   Wt 143 lb 9.6 oz (65.1 kg)   SpO2 95%   BMI 23.18 kg/m   Outpatient Encounter Medications as of 03/10/2024  Medication Sig   albuterol (VENTOLIN HFA) 108 (90 Base) MCG/ACT inhaler Inhale 2 puffs into the lungs every 6 (six) hours as needed for wheezing or shortness of breath. (Patient not taking: Reported on 11/11/2023)   Albuterol Sulfate 2.5 MG/0.5ML NEBU Inhale 1 each into the lungs 3 (three) times  daily.   ascorbic acid (VITAMIN C) 500 MG tablet Take 1,000 mg by mouth daily.   Budeson-Glycopyrrol-Formoterol (BREZTRI AEROSPHERE) 160-9-4.8 MCG/ACT AERO Inhale 2 puffs into the lungs in the morning and at bedtime.   cholecalciferol (VITAMIN D) 25 MCG (1000 UNIT) tablet Take 3,000 Units by mouth daily.   feeding supplement (ENSURE ENLIVE / ENSURE PLUS) LIQD Take 237 mLs by mouth 3 (three) times daily between meals.   ferrous sulfate 325 (65 FE) MG EC tablet Take 325 mg by mouth every Monday, Wednesday, and Friday.   finasteride (PROSCAR) 5 MG tablet Take 1 tablet (5 mg total) by mouth daily.   levothyroxine (SYNTHROID) 50 MCG tablet Take 50 mcg by mouth daily before breakfast.   losartan (COZAAR) 25 MG tablet Take 12.5 mg by mouth daily.   methocarbamol (ROBAXIN) 500 MG tablet Take 500 mg by mouth every 8 (eight) hours as needed for muscle spasms.   OXYGEN Inhale 2 L into the lungs as directed. Every Shift; Day, Evening, Night   tamsulosin (FLOMAX) 0.4 MG CAPS capsule Take 2 capsules (0.8 mg total) by mouth daily.   UNABLE TO FIND Diet: Dysphagia 2/Thin Liquids, Consistent carbohydrate   No facility-administered encounter medications on file as of 03/10/2024.     SIGNIFICANT DIAGNOSTIC EXAMS  03-12-23 wbc 7.8; hgb 12.3; hct 38.6; mcv 91.3 plt 289; glucose 114; bun 24; creat 0.89; k+ 4.0; na++ 135; ca 9.1 gfr >60; protein 6.6 albumin 3.1 tsh 3.506; vitamin B 12: 1393 rpr: nr  09-28-23:  wbc 9.6; hgb 16.0; hgb 50.4 mcv 95.1 plt 251 glucose 141; bun 28; creat 0.83; k+ 3.9; na++ 137; ca 9.6 gfr >60; protein 7.1; albumin 3.7; hgb A1c 6.9 iron 93 tibc 436; chol 258; ldl 174; trig 163; hdl 51 psa 0.6 tsh 6.055; vitamin D 62.23; ACR 10.9 11-09-23: tsh 3.912 free t4: 1.17 11-27-23: glucose 131; bun 23; creat 0.70; k+ 3.8; na++ 134; ca 9.0 gfr >60  12-31-22: hgb A1c 7.1; chol 203; ldl 126; trig 187; hdl 40  NO NEW LABS.     Review of Systems  Constitutional:  Negative for malaise/fatigue.  Respiratory:  Negative for cough and shortness of breath.   Cardiovascular:  Negative for chest pain, palpitations and leg swelling.  Gastrointestinal:  Negative for abdominal pain, constipation and heartburn.  Musculoskeletal:  Negative for back pain, joint pain and myalgias.  Skin: Negative.   Neurological:  Negative for dizziness.  Psychiatric/Behavioral:  The patient is not nervous/anxious.    Physical Exam Constitutional:      General: He is not in acute distress.    Appearance: He is well-developed. He is not diaphoretic.  Neck:     Thyroid: No thyromegaly.  Cardiovascular:     Rate and Rhythm: Normal rate and regular rhythm.     Pulses: Normal pulses.     Heart sounds: Normal heart sounds.  Pulmonary:     Effort: Pulmonary effort is normal. No respiratory distress.     Breath sounds: Normal breath sounds.  Abdominal:     General: Bowel sounds are normal. There is no distension.     Palpations: Abdomen is soft.     Tenderness: There is no abdominal tenderness.  Musculoskeletal:        General: Normal range of motion.     Cervical back: Neck supple.  Lymphadenopathy:     Cervical: No cervical adenopathy.  Skin:    General: Skin is warm and dry.  Neurological:     Mental Status:  He is alert. Mental status is at baseline.     Comments: SLUMS: 15/30  Psychiatric:        Mood and Affect: Mood normal.     ASSESSMENT/ PLAN:  TODAY  Iron deficiency anemia  hgb 12.3; iron  19; will continue iron three times weekly;   Controlled type 2 diabetes mellitus with hyperglycemia (HCC) steroid induced; hgb A1c 6.9 will monitor  Vascular dementia without behavioral disturbance (HCC) weight is 143 pounds without significant change; will continue to monitor     PREVIOUS   4. Aortic atherosclerosis (ct 05-23-22) not on statin due to advanced age.   5. Hypertension associated with type 2 diabetes mellitus: b/p 132/68; is on asa 81 mg daily   6. Pulmonary emphysema unspecified emphysema / chronic respiratory failure with hpyoxia: has 81 year pack history. Will continue breztri 160-9-4.8 mcg 2 puffs twice daily; has albuterol neg or 2 puffs every 6 hours as needed  7. Gastroesophageal reflux disease: is off PPI    8. BPH with obstruction urinary symptoms: will continue proscar 5 mg daily and flomax 0.8 mg daily   9. Protein calorie malnutrition, severe: protein 5.6 albumin 2.5 will continue supplements as directed  10. Chronic constipation: is presently not on medications.    Britt Candle NP [facility]   call [phone]

## 2024-03-11 DIAGNOSIS — R498 Other voice and resonance disorders: Secondary | ICD-10-CM | POA: Diagnosis not present

## 2024-03-11 DIAGNOSIS — J9611 Chronic respiratory failure with hypoxia: Secondary | ICD-10-CM | POA: Diagnosis not present

## 2024-03-11 DIAGNOSIS — J449 Chronic obstructive pulmonary disease, unspecified: Secondary | ICD-10-CM | POA: Diagnosis not present

## 2024-03-14 ENCOUNTER — Other Ambulatory Visit (HOSPITAL_COMMUNITY)
Admission: RE | Admit: 2024-03-14 | Discharge: 2024-03-14 | Disposition: A | Discharge status: Home / Self Care | Source: Skilled Nursing Facility | Attending: Adult Health | Admitting: Adult Health

## 2024-03-14 DIAGNOSIS — R739 Hyperglycemia, unspecified: Secondary | ICD-10-CM | POA: Insufficient documentation

## 2024-03-14 DIAGNOSIS — J9611 Chronic respiratory failure with hypoxia: Secondary | ICD-10-CM | POA: Diagnosis not present

## 2024-03-14 DIAGNOSIS — J449 Chronic obstructive pulmonary disease, unspecified: Secondary | ICD-10-CM | POA: Diagnosis not present

## 2024-03-14 DIAGNOSIS — R498 Other voice and resonance disorders: Secondary | ICD-10-CM | POA: Diagnosis not present

## 2024-03-14 LAB — COMPREHENSIVE METABOLIC PANEL WITH GFR
ALT: 21 U/L (ref 0–44)
AST: 18 U/L (ref 15–41)
Albumin: 3.3 g/dL — ABNORMAL LOW (ref 3.5–5.0)
Alkaline Phosphatase: 67 U/L (ref 38–126)
Anion gap: 12 (ref 5–15)
BUN: 22 mg/dL (ref 8–23)
CO2: 26 mmol/L (ref 22–32)
Calcium: 9.7 mg/dL (ref 8.9–10.3)
Chloride: 101 mmol/L (ref 98–111)
Creatinine, Ser: 0.79 mg/dL (ref 0.61–1.24)
GFR, Estimated: >60 mL/min (ref >60)
Glucose, Bld: 148 mg/dL — ABNORMAL HIGH (ref 70–99)
Potassium: 4 mmol/L (ref 3.5–5.1)
Sodium: 139 mmol/L (ref 135–145)
Total Bilirubin: 0.6 mg/dL (ref 0.0–1.2)
Total Protein: 6.8 g/dL (ref 6.5–8.1)

## 2024-03-14 LAB — HEMOGLOBIN A1C
Hgb A1c MFr Bld: 7.2 % — ABNORMAL HIGH (ref 4.8–5.6)
Mean Plasma Glucose: 159.94 mg/dL

## 2024-03-14 LAB — CBC
HCT: 46.2 % (ref 39.0–52.0)
Hemoglobin: 15.1 g/dL (ref 13.0–17.0)
MCH: 29 pg (ref 26.0–34.0)
MCHC: 32.7 g/dL (ref 30.0–36.0)
MCV: 88.8 fL (ref 80.0–100.0)
Platelets: 227 10*3/uL (ref 150–400)
RBC: 5.2 MIL/uL (ref 4.22–5.81)
RDW: 13.2 % (ref 11.5–15.5)
WBC: 8 10*3/uL (ref 4.0–10.5)
nRBC: 0 % (ref 0.0–0.2)

## 2024-03-15 DIAGNOSIS — J449 Chronic obstructive pulmonary disease, unspecified: Secondary | ICD-10-CM | POA: Diagnosis not present

## 2024-03-15 DIAGNOSIS — R498 Other voice and resonance disorders: Secondary | ICD-10-CM | POA: Diagnosis not present

## 2024-03-15 DIAGNOSIS — J9611 Chronic respiratory failure with hypoxia: Secondary | ICD-10-CM | POA: Diagnosis not present

## 2024-03-15 NOTE — Assessment & Plan Note (Signed)
 steroid induced; hgb A1c 6.9 will monitor

## 2024-03-15 NOTE — Assessment & Plan Note (Signed)
 weight is 143 pounds without significant change; will continue to monitor

## 2024-03-15 NOTE — Assessment & Plan Note (Signed)
 hgb 12.3; iron 19; will continue iron three times weekly;

## 2024-03-16 DIAGNOSIS — R498 Other voice and resonance disorders: Secondary | ICD-10-CM | POA: Diagnosis not present

## 2024-03-16 DIAGNOSIS — J449 Chronic obstructive pulmonary disease, unspecified: Secondary | ICD-10-CM | POA: Diagnosis not present

## 2024-03-16 DIAGNOSIS — J9611 Chronic respiratory failure with hypoxia: Secondary | ICD-10-CM | POA: Diagnosis not present

## 2024-03-17 DIAGNOSIS — J9611 Chronic respiratory failure with hypoxia: Secondary | ICD-10-CM | POA: Diagnosis not present

## 2024-03-17 DIAGNOSIS — R498 Other voice and resonance disorders: Secondary | ICD-10-CM | POA: Diagnosis not present

## 2024-03-17 DIAGNOSIS — J449 Chronic obstructive pulmonary disease, unspecified: Secondary | ICD-10-CM | POA: Diagnosis not present

## 2024-03-18 DIAGNOSIS — J9611 Chronic respiratory failure with hypoxia: Secondary | ICD-10-CM | POA: Diagnosis not present

## 2024-03-18 DIAGNOSIS — R498 Other voice and resonance disorders: Secondary | ICD-10-CM | POA: Diagnosis not present

## 2024-03-18 DIAGNOSIS — J449 Chronic obstructive pulmonary disease, unspecified: Secondary | ICD-10-CM | POA: Diagnosis not present

## 2024-03-21 DIAGNOSIS — R498 Other voice and resonance disorders: Secondary | ICD-10-CM | POA: Diagnosis not present

## 2024-03-21 DIAGNOSIS — J449 Chronic obstructive pulmonary disease, unspecified: Secondary | ICD-10-CM | POA: Diagnosis not present

## 2024-03-21 DIAGNOSIS — J9611 Chronic respiratory failure with hypoxia: Secondary | ICD-10-CM | POA: Diagnosis not present

## 2024-03-22 DIAGNOSIS — R498 Other voice and resonance disorders: Secondary | ICD-10-CM | POA: Diagnosis not present

## 2024-03-22 DIAGNOSIS — J449 Chronic obstructive pulmonary disease, unspecified: Secondary | ICD-10-CM | POA: Diagnosis not present

## 2024-03-22 DIAGNOSIS — J9611 Chronic respiratory failure with hypoxia: Secondary | ICD-10-CM | POA: Diagnosis not present

## 2024-03-23 DIAGNOSIS — J449 Chronic obstructive pulmonary disease, unspecified: Secondary | ICD-10-CM | POA: Diagnosis not present

## 2024-03-23 DIAGNOSIS — R498 Other voice and resonance disorders: Secondary | ICD-10-CM | POA: Diagnosis not present

## 2024-03-23 DIAGNOSIS — J9611 Chronic respiratory failure with hypoxia: Secondary | ICD-10-CM | POA: Diagnosis not present

## 2024-03-30 ENCOUNTER — Encounter: Payer: Self-pay | Admitting: Adult Health

## 2024-03-30 ENCOUNTER — Non-Acute Institutional Stay (SKILLED_NURSING_FACILITY): Payer: Self-pay | Admitting: Adult Health

## 2024-03-30 DIAGNOSIS — Z0000 Encounter for general adult medical examination without abnormal findings: Secondary | ICD-10-CM

## 2024-03-30 NOTE — Patient Instructions (Signed)
  Mr. Kratovil , Thank you for taking time to come for your Medicare Wellness Visit. I appreciate your ongoing commitment to your health goals. Please review the following plan we discussed and let me know if I can assist you in the future.   These are the goals we discussed:  Goals      Absence of Fall and Fall-Related Injury     Evidence-based guidance:  Assess fall risk using a validated tool when available. Consider balance and gait impairment, muscle weakness, diminished vision or hearing, environmental hazards, presence of urinary or bowel urgency and/or incontinence.  Communicate fall injury risk to interprofessional healthcare team.  Develop a fall prevention plan with the patient and family.  Promote use of personal vision and auditory aids.  Promote reorientation, appropriate sensory stimulation, and routines to decrease risk of fall when changes in mental status are present.  Assess assistance level required for safe and effective self-care; consider referral for home care.  Encourage physical activity, such as performance of self-care at highest level of ability, strength and balance exercise program, and provision of appropriate assistive devices; refer to rehabilitation therapy.  Refer to community-based fall prevention program where available.  If fall occurs, determine the cause and revise fall injury prevention plan.  Regularly review medication contribution to fall risk; consider risk related to polypharmacy and age.  Refer to pharmacist for consultation when concerns about medications are revealed.  Balance adequate pain management with potential for oversedation.  Provide guidance related to environmental modifications.  Consider supplementation with Vitamin D .   Notes:      DIET - INCREASE WATER  INTAKE     General - Client will not be readmitted within 30 days (C-SNP)        This is a list of the screening recommended for you and due dates:  Health Maintenance  Topic  Date Due   Zoster (Shingles) Vaccine (1 of 2) Never done   Flu Shot  07/01/2024   Hemoglobin A1C  09/13/2024   Yearly kidney health urinalysis for diabetes  09/27/2024   Complete foot exam   10/08/2024   Eye exam for diabetics  11/15/2024   Yearly kidney function blood test for diabetes  03/14/2025   Medicare Annual Wellness Visit  03/30/2025   HPV Vaccine  Aged Out   Meningitis B Vaccine  Aged Out   DTaP/Tdap/Td vaccine  Discontinued   Pneumonia Vaccine  Discontinued   COVID-19 Vaccine  Discontinued

## 2024-03-30 NOTE — Progress Notes (Signed)
 Subjective:   Michael Evans is a 83 y.o. male who presents for Medicare Annual/Subsequent preventive examination.  Visit Complete: In person  Patient Medicare AWV questionnaire was completed by the patient on 03-30-2024; I have confirmed that all information answered by patient is correct and no changes since this date.  Cardiac Risk Factors include: advanced age (>51men, >54 women);diabetes mellitus;hypertension;male gender;sedentary lifestyle     Objective:    Today's Vitals   03/30/24 0956  BP: 135/75  Pulse: 68  Resp: 20  Temp: 98.6 F (37 C)  SpO2: 95%  Weight: 143 lb 9.6 oz (65.1 kg)  Height: 5' 6.5" (1.689 m)   Body mass index is 22.83 kg/m.     11/11/2023   11:04 AM 06/03/2023    9:22 AM 04/20/2023   10:15 AM 04/14/2023   10:07 AM 03/24/2023    9:34 AM 03/23/2023   12:25 PM 03/16/2023    8:56 AM  Advanced Directives  Does Patient Have a Medical Advance Directive? No No No No No No No  Does patient want to make changes to medical advance directive? No - Patient declined No - Patient declined No - Patient declined No - Patient declined No - Patient declined No - Patient declined No - Patient declined  Would patient like information on creating a medical advance directive?  No - Patient declined No - Patient declined No - Patient declined       Current Medications (verified) Outpatient Encounter Medications as of 03/30/2024  Medication Sig   albuterol  (VENTOLIN  HFA) 108 (90 Base) MCG/ACT inhaler Inhale 2 puffs into the lungs every 6 (six) hours as needed for wheezing or shortness of breath. And neb treatment at HS   Albuterol  Sulfate 2.5 MG/0.5ML NEBU Inhale 1 each into the lungs 3 (three) times daily.   ascorbic acid  (VITAMIN C ) 500 MG tablet Take 1,000 mg by mouth daily.   Budeson-Glycopyrrol-Formoterol  (BREZTRI  AEROSPHERE) 160-9-4.8 MCG/ACT AERO Inhale 2 puffs into the lungs in the morning and at bedtime.   cholecalciferol  (VITAMIN D ) 25 MCG (1000 UNIT) tablet Take  3,000 Units by mouth daily.   feeding supplement (ENSURE ENLIVE / ENSURE PLUS) LIQD Take 237 mLs by mouth 3 (three) times daily between meals.   ferrous sulfate 325 (65 FE) MG EC tablet Take 325 mg by mouth every Monday, Wednesday, and Friday.   finasteride  (PROSCAR ) 5 MG tablet Take 1 tablet (5 mg total) by mouth daily.   levothyroxine (SYNTHROID) 50 MCG tablet Take 50 mcg by mouth daily before breakfast.   losartan  (COZAAR ) 25 MG tablet Take 12.5 mg by mouth daily.   methocarbamol (ROBAXIN) 500 MG tablet Take 500 mg by mouth every 8 (eight) hours as needed for muscle spasms.   OXYGEN  Inhale 2 L into the lungs as directed. Every Shift; Day, Evening, Night   tamsulosin  (FLOMAX ) 0.4 MG CAPS capsule Take 2 capsules (0.8 mg total) by mouth daily.   UNABLE TO FIND Diet: Dysphagia 2/Thin Liquids, Consistent carbohydrate   No facility-administered encounter medications on file as of 03/30/2024.    Allergies (verified) Benzyl alcohol, Amlodipine , Aspirin , Other, Penicillins, Sulfa antibiotics, and Tylenol  [acetaminophen ]   History: Past Medical History:  Diagnosis Date   Arthritis    Chronic rhinitis    Complication of anesthesia    patient usually hs to be intubated with anesthesia   COPD (chronic obstructive pulmonary disease) (HCC)    PFT 06-26-09 FEV1  1.75( 71%), FVC 3.65( 101%), FEV1% 48, TLC 5.53(104%), DLCO 55%,  no BD   Dyspnea    Hyperlipidemia    Hypertension    Nasal septal deviation    Nocturia    On home O2    2L N/C   OSA (obstructive sleep apnea) 04/21/2011   Partial small bowel obstruction (HCC) 10/18/2021   Skin cancer    Past Surgical History:  Procedure Laterality Date   APPENDECTOMY     BACK SURGERY     CATARACT EXTRACTION W/PHACO  01/06/2013   Procedure: CATARACT EXTRACTION PHACO AND INTRAOCULAR LENS PLACEMENT (IOC);  Surgeon: Anner Kill, MD;  Location: [facility];  Service: Ophthalmology;  Laterality: Right;  CDE: 22.17   CATARACT EXTRACTION W/PHACO Left 06/20/2022    Procedure: CATARACT EXTRACTION PHACO AND INTRAOCULAR LENS PLACEMENT (IOC);  Surgeon: Tarri Farm, MD;  Location: [facility];  Service: Ophthalmology;  Laterality: Left;  CDE 20.85   FEMORAL HERNIA REPAIR Right 11/19/2022   Procedure: HERNIA REPAIR FEMORAL WITH MESH;  Surgeon: Awilda Bogus, MD;  Location: [facility];  Service: General;  Laterality: Right;   INGUINAL HERNIA REPAIR Left 12/09/2021   Procedure: HERNIA REPAIR INGUINAL ADULT WITH MESH;  Surgeon: Awilda Bogus, MD;  Location: [facility];  Service: General;  Laterality: Left;   INGUINAL HERNIA REPAIR Right 03/19/2022   Procedure: HERNIA REPAIR INGUINAL ADULT WITH MESH;  Surgeon: Awilda Bogus, MD;  Location: [facility];  Service: General;  Laterality: Right;   ORIF PATELLA Left 01/22/2023   Procedure: OPEN REDUCTION INTERNAL FIXATION (ORIF) PATELLA;  Surgeon: Tonita Frater, MD;  Location: [facility];  Service: Orthopedics;  Laterality: Left;   Family History  Problem Relation Age of Onset   Lung cancer Mother    Social History   Socioeconomic History   Marital status: Divorced    Spouse name: Not on file   Number of children: 3   Years of education: Not on file   Highest education level: Not on file  Occupational History   Occupation: Armed forces technical officer: RETIRED  Tobacco Use   Smoking status: Former    Current packs/day: 0.00    Average packs/day: 1.5 packs/day for 54.0 years (81.0 ttl pk-yrs)    Types: Cigarettes    Start date: 12/02/1947    Quit date: 12/01/2001    Years since quitting: 22.3   Smokeless tobacco: Never  Vaping Use   Vaping status: Never Used  Substance and Sexual Activity   Alcohol use: No   Drug use: No   Sexual activity: Not on file  Other Topics Concern   Not on file  Social History Narrative   Lives with sister.    Social Drivers of Corporate investment banker Strain: Low Risk  (02/07/2022)   Overall Financial Resource Strain (CARDIA)    Difficulty of Paying Living Expenses: Not hard at all   Food Insecurity: Low Risk  (08/06/2023)   Received from [facility]   Hunger Vital Sign    Worried About Running Out of Food in the Last Year: Never true    Ran Out of Food in the Last Year: Never true  Transportation Needs: No Transportation Needs (08/06/2023)   Received from [facility]    In the past 12 months, has lack of reliable transportation kept you from medical appointments, meetings, work or from getting things needed for daily living? : No  Physical Activity: Insufficiently Active (02/07/2022)   Exercise Vital Sign    Days of Exercise per Week: 3 days  Minutes of Exercise per Session: 20 min  Stress: No Stress Concern Present (02/07/2022)   [employer] of Occupational Health - Occupational Stress Questionnaire    Feeling of Stress : Not at all  Social Connections: Socially Isolated (02/07/2022)   Social Connection and Isolation Panel [NHANES]    Frequency of Communication with Friends and Family: More than three times a week    Frequency of Social Gatherings with Friends and Family: More than three times a week    Attends Religious Services: Never    Database administrator or Organizations: No    Attends Engineer, structural: Never    Marital Status: Divorced    Tobacco Counseling Counseling given: Not Answered   Clinical Intake:  Pre-visit preparation completed: Yes  Pain : No/denies pain     BMI - recorded: 22.83 Nutritional Status: BMI of 19-24  Normal Nutritional Risks: Unintentional weight loss, Failure to thrive Diabetes: Yes CBG done?: Yes CBG resulted in Enter/ Edit results?: Yes Did pt. bring in CBG monitor from home?: No  How often do you need to have someone help you when you read instructions, pamphlets, or other written materials from your doctor or pharmacy?: 5 - Always  Interpreter Needed?: No  Comments: long term resident of SNF   Activities of Daily Living    03/30/2024   10:00 AM  In your present  state of health, do you have any difficulty performing the following activities:  Hearing? 0  Vision? 0  Difficulty concentrating or making decisions? 0  Walking or climbing stairs? 1  Dressing or bathing? 0  Doing errands, shopping? 1  Preparing Food and eating ? Y  Using the Toilet? Y  In the past six months, have you accidently leaked urine? Y  Do you have problems with loss of bowel control? Y  Managing your Medications? Y  Managing your Finances? Y  Housekeeping or managing your Housekeeping? Y    Patient Care Team: Marilyne Shu, NP as PCP - [facility]  Indicate any recent Medical Services you may have received from other than [facility] providers in the past year (date may be approximate).     Assessment:   This is a routine wellness examination for [place].  Hearing/Vision screen No results found.   Goals Addressed             This Visit's Progress    Absence of Fall and Fall-Related Injury   On track    Evidence-based guidance:  Assess fall risk using a validated tool when available. Consider balance and gait impairment, muscle weakness, diminished vision or hearing, environmental hazards, presence of urinary or bowel urgency and/or incontinence.  Communicate fall injury risk to interprofessional healthcare team.  Develop a fall prevention plan with the patient and family.  Promote use of personal vision and auditory aids.  Promote reorientation, appropriate sensory stimulation, and routines to decrease risk of fall when changes in mental status are present.  Assess assistance level required for safe and effective self-care; consider referral for home care.  Encourage physical activity, such as performance of self-care at highest level of ability, strength and balance exercise program, and provision of appropriate assistive devices; refer to rehabilitation therapy.  Refer to community-based fall  prevention program where available.  If fall occurs, determine the cause and revise fall injury prevention plan.  Regularly review medication contribution to fall risk; consider risk related to polypharmacy and age.  Refer to  pharmacist for consultation when concerns about medications are revealed.  Balance adequate pain management with potential for oversedation.  Provide guidance related to environmental modifications.  Consider supplementation with Vitamin D .   Notes:      DIET - INCREASE WATER  INTAKE   On track    General - Client will not be readmitted within 30 days ([facility])   On track      Depression Screen    03/30/2024   10:02 AM 12/22/2023   11:10 AM 12/22/2023   11:09 AM 09/24/2023   10:56 AM 03/24/2023   10:43 AM 10/16/2022   11:32 AM 07/28/2022   12:32 PM  PHQ 2/9 Scores  PHQ - 2 Score 0 0 0 0 0 0 0  PHQ- 9 Score  1  0       Fall Risk    03/30/2024   10:01 AM 12/22/2023   11:09 AM 09/24/2023   10:56 AM 03/24/2023   10:43 AM 10/16/2022   11:32 AM  Fall Risk   Falls in the past year? 0 0 0 0 0  Number falls in past yr: 0 0 0 0 0  Injury with Fall? 0 0 0 0 0  Risk for fall due to : Impaired balance/gait;Impaired mobility Impaired balance/gait;Impaired mobility Impaired balance/gait;Impaired mobility Impaired mobility;Impaired balance/gait No Fall Risks  Follow up   Falls evaluation completed Falls evaluation completed Falls prevention discussed    MEDICARE RISK AT HOME: Medicare Risk at Home Any stairs in or around the home?: Yes If so, are there any without handrails?: No Home free of loose throw rugs in walkways, pet beds, electrical cords, etc?: Yes Adequate lighting in your home to reduce risk of falls?: Yes Life alert?: No Use of a cane, walker or w/c?: Yes Grab bars in the bathroom?: Yes Shower chair or bench in shower?: Yes Elevated toilet seat or a handicapped toilet?: Yes  TIMED UP AND GO:  Was the test performed?  Yes  Length of time to ambulate  10 feet: 10 sec Gait slow and steady with assistive device    Cognitive Function:    03/30/2024   10:02 AM 03/24/2023   10:44 AM  MMSE - Mini Mental State Exam  Not completed:  Unable to complete  Orientation to time 4   Orientation to Place 4   Registration 3   Attention/ Calculation 4   Recall 3   Language- name 2 objects 2   Language- repeat 1   Language- follow 3 step command 2   Language- read & follow direction 1   Write a sentence 0   Copy design 0   Total score 24         03/30/2024   10:03 AM 02/07/2022    9:03 AM  6CIT Screen  What Year? 0 points 0 points  What month? 0 points 0 points  What time? 0 points 0 points  Count back from 20 4 points 0 points  Months in reverse 4 points 4 points  Repeat phrase 6 points 10 points  Total Score 14 points 14 points    Immunizations Immunization History  Administered Date(s) Administered   Fluad Quad(high Dose 65+) 08/23/2019, 12/07/2020, 11/29/2021, 09/21/2023   Influenza Whole 09/18/2009, 11/27/2010   Influenza, High Dose Seasonal PF 09/21/2017, 11/04/2018   Influenza,inj,Quad PF,6+ Mos 09/13/2013, 01/23/2016, 08/15/2016   Moderna Sars-Covid-2 Vaccination 02/06/2020, 03/07/2020, 02/02/2023, 09/08/2023, 03/01/2024   Pneumococcal Conjugate (Pcv15) 02/04/2023   Pneumococcal Polysaccharide-23 09/18/2009, 01/23/2016    TDAP status:  Up to date  Flu Vaccine status: Up to date  Pneumococcal vaccine status: Up to date  Covid-19 vaccine status: Completed vaccines  Qualifies for Shingles Vaccine? No   Zostavax completed     Screening Tests Health Maintenance  Topic Date Due   Zoster Vaccines- Shingrix (1 of 2) Never done   INFLUENZA VACCINE  07/01/2024   HEMOGLOBIN A1C  09/13/2024   Diabetic kidney evaluation - Urine ACR  09/27/2024   FOOT EXAM  10/08/2024   OPHTHALMOLOGY EXAM  11/15/2024   Diabetic kidney evaluation - eGFR measurement  03/14/2025   Medicare Annual Wellness (AWV)  03/30/2025   HPV VACCINES   Aged Out   Meningococcal B Vaccine  Aged Out   DTaP/Tdap/Td  Discontinued   Pneumonia Vaccine 41+ Years old  Discontinued   COVID-19 Vaccine  Discontinued    Health Maintenance  Health Maintenance Due  Topic Date Due   Zoster Vaccines- Shingrix (1 of 2) Never done    Colorectal cancer screening: No longer required.   Lung Cancer Screening: (Low Dose CT Chest recommended if Age 108-80 years, 20 pack-year currently smoking OR have quit w/in 15years.) does not qualify.   Lung Cancer Screening Referral:   Additional Screening:  Hepatitis C Screening: does not qualify; Completed   Vision Screening: Recommended annual ophthalmology exams for early detection of glaucoma and other disorders of the eye. Is the patient up to date with their annual eye exam?  No  Who is the provider or what is the name of the office in which the patient attends annual eye exams?  If pt is not established with a provider, would they like to be referred to a provider to establish care? No .   Dental Screening: Recommended annual dental exams for proper oral hygiene  Diabetic Foot Exam: Diabetic Foot Exam: Completed 10-08-2024  Community Resource Referral / Chronic Care Management: CRR required this visit?  No   CCM required this visit?  No     Plan:     I have personally reviewed and noted the following in the patient's chart:   Medical and social history Use of alcohol, tobacco or illicit drugs  Current medications and supplements including opioid prescriptions. Patient is not currently taking opioid prescriptions. Functional ability and status Nutritional status Physical activity Advanced directives List of other physicians Hospitalizations, surgeries, and ER visits in previous 12 months Vitals Screenings to include cognitive, depression, and falls Referrals and appointments  In addition, I have reviewed and discussed with patient certain preventive protocols, quality metrics, and best  practice recommendations. A written personalized care plan for preventive services as well as general preventive health recommendations were provided to patient.     Marilyne Shu, NP   03/30/2024   After Visit Summary: (In Person-Declined) Patient declined AVS at this time.  Nurse Notes: this exam was performed by myself at this facility

## 2024-04-06 ENCOUNTER — Encounter: Payer: Self-pay | Admitting: Internal Medicine

## 2024-04-06 ENCOUNTER — Non-Acute Institutional Stay (SKILLED_NURSING_FACILITY): Payer: Self-pay | Admitting: Internal Medicine

## 2024-04-06 DIAGNOSIS — R1031 Right lower quadrant pain: Secondary | ICD-10-CM | POA: Diagnosis not present

## 2024-04-06 DIAGNOSIS — E1165 Type 2 diabetes mellitus with hyperglycemia: Secondary | ICD-10-CM | POA: Diagnosis not present

## 2024-04-06 DIAGNOSIS — I10 Essential (primary) hypertension: Secondary | ICD-10-CM | POA: Diagnosis not present

## 2024-04-06 DIAGNOSIS — D509 Iron deficiency anemia, unspecified: Secondary | ICD-10-CM

## 2024-04-06 NOTE — Assessment & Plan Note (Addendum)
 Anemia has resolved.  He is on iron 3 times weekly.  No bleeding dyscrasias reported by staff.  Consider discontinuing the oral iron & continue to monitor.

## 2024-04-06 NOTE — Assessment & Plan Note (Addendum)
 A1c has risen slightly from a value of 6.9% up to 7.2%.  Goal would be less than 8 with no evidence of hypoglycemia because of his advanced age and comorbidities.  He is on no diabetic medicines.  Continue to monitor A1c every 4-6 months. If A1c rises at recheck; consider adding metformin as his GFR is greater than 60. Lipids could be rechecked as well; LDL have improved from 174 down to 126.  I would be reluctant to start him on statin at 83.

## 2024-04-06 NOTE — Patient Instructions (Signed)
 See assessment and plan under each diagnosis in the problem list and acutely for this visit:  Essential hypertension Systolic blood pressure is minimally elevated today on low-dose ARB.  If there is consistent systolic hypertension; the losartan  will be increased to 25 mg daily.  Controlled type 2 diabetes mellitus with hyperglycemia (HCC) A1c has risen slightly from a value of 6.9% up to 7.2%.  Goal would be less than 8 with no evidence of hypoglycemia because of his advanced age and comorbidities.  He is on no diabetic medicines.  Continue to monitor A1c every 4-6 months. If A1c rises at recheck; consider adding metformin as his GFR is greater than 60. Lipids could be rechecked as well; LDL have improved from 174 down to 126.  I would be reluctant to start him on statin at 83.  Iron deficiency anemia Anemia has resolved.  He is on iron 3 times weekly.  No bleeding dyscrasias reported by staff.  Consider discontinuing the oral iron & continue to monitor.

## 2024-04-06 NOTE — Progress Notes (Unsigned)
 NURSING HOME LOCATION:  [facility] ROOM NUMBER:  [id number]  CODE STATUS:  Full Code  PCP: Marilyne Shu, NP   This is a nursing facility follow up visit of chronic medical diagnoses to document compliance with Regulation 483.30 (c) in The Long Term Care Survey Manual Phase 2 which mandates caregiver visit ( visits can alternate among physician, PA or NP as per statutes) within 10 days of 30 days / 60 days/ 90 days post admission to SNF date  .  Interim medical record and care since last SNF visit was updated with review of diagnostic studies and change in clinical status since last visit were documented.  HPI: He is a permanent resident of this facility with medical diagnoses of obstructive sleep apnea, history of small bowel obstruction, history of skin cancer, essential hypertension, dyslipidemia, oxygen  dependent COPD, and degenerative joint disease.  Labs are current as of 03/14/2024.  He has exhibited mild hyperglycemia serially with glucoses ranging from the low 131 up to high of 148.  A1c is 7.2%; serially this has been essentially stable and is at goal of less than 8%.  CBC is normal.  In October 2024 TSH was above goal at 6.055; on the current supplementation TSH is therapeutic at 3.912 is on 1012/08/2023.  His LDL on 01/01/2024 was 126; this represents significant improvement from 09/28/2023 when it was 174.  He is not on a statin; but he was under the impression he is.  Review of systems: He states that his shortness of breath is essentially the same.  He describes the production of slightly yellow sputum up to half a cup a day.  He has intermittent right lower quadrant sharp abdominal pain which last 3-4 seconds.  This is not initiated by cough; but he states this began after his last hernia surgery.  He denies any active GI symptoms.   Constitutional: No fever, significant weight change, fatigue  Eyes: No redness, discharge, pain, vision change ENT/mouth: No nasal  congestion,  purulent discharge, earache, change in hearing, sore throat  Cardiovascular: No chest pain, palpitations, paroxysmal nocturnal dyspnea, claudication, edema  Respiratory: No cough, sputum production, hemoptysis, DOE, significant snoring, apnea   Gastrointestinal: No heartburn, dysphagia, abdominal pain, nausea /vomiting, rectal bleeding, melena, change in bowels Genitourinary: No dysuria, hematuria, pyuria, incontinence, nocturia Musculoskeletal: No joint stiffness, joint swelling, weakness, pain Dermatologic: No rash, pruritus, change in appearance of skin Neurologic: No dizziness, headache, syncope, seizures, numbness, tingling Psychiatric: No significant anxiety, depression, insomnia, anorexia Endocrine: No change in hair/skin/nails, excessive thirst, excessive hunger, excessive urination  Hematologic/lymphatic: No significant bruising, lymphadenopathy, abnormal bleeding Allergy/immunology: No itchy/watery eyes, significant sneezing, urticaria, angioedema  Physical exam:  Pertinent or positive findings: He appears his age and chronically ill.  He is wearing nasal oxygen .  Responses are markedly slow.  Character of voice is slightly gravelly.  There is slight exotropia of the right eye.  He is completely edentulous.  Lungs are silent.  The cardiac rhythm is irregular.  Abdomen is protuberant.  There is no tenderness to palpation.  He pedal pulses are decreased, especially the posterior tibial pulses.  He has trace edema at the sock line. General appearance: Adequately nourished; no acute distress, increased work of breathing is present.   Lymphatic: No lymphadenopathy about the head, neck, axilla. Eyes: No conjunctival inflammation or lid edema is present. There is no scleral icterus. Ears:  External ear exam shows no significant lesions or deformities.   Nose:  External  nasal examination shows no deformity or inflammation. Nasal mucosa are pink and moist without lesions,  exudates Oral exam:  Lips and gums are healthy appearing. There is no oropharyngeal erythema or exudate. Neck:  No thyromegaly, masses, tenderness noted.    Heart:  Normal rate and regular rhythm. S1 and S2 normal without gallop, murmur, click, rub .  Lungs: Chest clear to auscultation without wheezes, rhonchi, rales, rubs. Abdomen: Bowel sounds are normal. Abdomen is soft and nontender with no organomegaly, hernias, masses. GU: Deferred  Extremities:  No cyanosis, clubbing, edema  Neurologic exam : Cn 2-7 intact Strength equal  in upper & lower extremities Balance, Rhomberg, finger to nose testing could not be completed due to clinical state Deep tendon reflexes are equal Skin: Warm & dry w/o tenting. No significant lesions or rash.  See summary under each active problem in the Problem List with associated updated therapeutic plan

## 2024-04-06 NOTE — Assessment & Plan Note (Addendum)
 Systolic blood pressure is minimally elevated today on low-dose ARB. Serially this is an outlier. If there is consistent systolic hypertension; the losartan  will be increased to 25 mg daily.

## 2024-04-27 DIAGNOSIS — B353 Tinea pedis: Secondary | ICD-10-CM | POA: Diagnosis not present

## 2024-04-27 DIAGNOSIS — L299 Pruritus, unspecified: Secondary | ICD-10-CM | POA: Diagnosis not present

## 2024-04-27 DIAGNOSIS — L603 Nail dystrophy: Secondary | ICD-10-CM | POA: Diagnosis not present

## 2024-05-04 ENCOUNTER — Non-Acute Institutional Stay (SKILLED_NURSING_FACILITY): Payer: Self-pay | Admitting: Adult Health

## 2024-05-04 DIAGNOSIS — I152 Hypertension secondary to endocrine disorders: Secondary | ICD-10-CM

## 2024-05-04 DIAGNOSIS — I700 Atherosclerosis of aorta: Secondary | ICD-10-CM

## 2024-05-04 DIAGNOSIS — E1159 Type 2 diabetes mellitus with other circulatory complications: Secondary | ICD-10-CM

## 2024-05-04 DIAGNOSIS — J9611 Chronic respiratory failure with hypoxia: Secondary | ICD-10-CM

## 2024-05-04 DIAGNOSIS — J439 Emphysema, unspecified: Secondary | ICD-10-CM

## 2024-05-05 ENCOUNTER — Encounter: Payer: Self-pay | Admitting: Adult Health

## 2024-05-05 NOTE — Progress Notes (Unsigned)
 Location:  [facility] Room Number: 107 Place of Service:  SNF (31)   CODE STATUS: full   Allergies  Allergen Reactions  . Benzyl Alcohol Itching  . Amlodipine  Swelling    Swelling in feet  . Aspirin  Other (See Comments)    Upset stomach.  . Other Hypertension    Hazelnuts  . Penicillins Hives    Has patient had a PCN reaction causing immediate rash, facial/tongue/throat swelling, SOB or lightheadedness with hypotension: Yes Has patient had a PCN reaction causing severe rash involving mucus membranes or skin necrosis: No Has patient had a PCN reaction that required hospitalization No Has patient had a PCN reaction occurring within the last 10 years: No If all of the above answers are "NO", then may proceed with Cephalosporin use.   . Sulfa Antibiotics Hives  . Tylenol  [Acetaminophen ] Other (See Comments)    Pt reports he has had liver issues and was told not to take tylenol      Chief Complaint  Patient presents with  . Medical Management of Chronic Issues       Aortic atherosclerosis Hypertension associated with type 2 diabetes mellitus:  Pulmonary emphysema unspecified emphysema/chronic respiratory failure with hypoxia    HPI:  He is a 83 y.o. long term resident of this facility being seen for the management of his chronic illnesses:   Past Medical History:  Diagnosis Date  . Arthritis   . Chronic rhinitis   . Complication of anesthesia    patient usually hs to be intubated with anesthesia  . COPD (chronic obstructive pulmonary disease) (HCC)    PFT 06-26-09 FEV1  1.75( 71%), FVC 3.65( 101%), FEV1% 48, TLC 5.53(104%), DLCO 55%, no BD  . Dyspnea   . Hyperlipidemia   . Hypertension   . Nasal septal deviation   . Nocturia   . On home O2    2L N/C  . OSA (obstructive sleep apnea) 04/21/2011  . Partial small bowel obstruction (HCC) 10/18/2021  . Skin cancer     Past Surgical History:  Procedure Laterality Date  . APPENDECTOMY    . BACK  SURGERY    . CATARACT EXTRACTION W/PHACO  01/06/2013   Procedure: CATARACT EXTRACTION PHACO AND INTRAOCULAR LENS PLACEMENT (IOC);  Surgeon: Anner Kill, MD;  Location: [facility];  Service: Ophthalmology;  Laterality: Right;  CDE: 22.17  . CATARACT EXTRACTION W/PHACO Left 06/20/2022   Procedure: CATARACT EXTRACTION PHACO AND INTRAOCULAR LENS PLACEMENT (IOC);  Surgeon: Tarri Farm, MD;  Location: [facility];  Service: Ophthalmology;  Laterality: Left;  CDE 20.85  . FEMORAL HERNIA REPAIR Right 11/19/2022   Procedure: HERNIA REPAIR FEMORAL WITH MESH;  Surgeon: Awilda Bogus, MD;  Location: [facility];  Service: General;  Laterality: Right;  . INGUINAL HERNIA REPAIR Left 12/09/2021   Procedure: HERNIA REPAIR INGUINAL ADULT WITH MESH;  Surgeon: Awilda Bogus, MD;  Location: [facility];  Service: General;  Laterality: Left;  . INGUINAL HERNIA REPAIR Right 03/19/2022   Procedure: HERNIA REPAIR INGUINAL ADULT WITH MESH;  Surgeon: Awilda Bogus, MD;  Location: [facility];  Service: General;  Laterality: Right;  . ORIF PATELLA Left 01/22/2023   Procedure: OPEN REDUCTION INTERNAL FIXATION (ORIF) PATELLA;  Surgeon: Tonita Frater, MD;  Location: [facility];  Service: Orthopedics;  Laterality: Left;    Social History   Socioeconomic History  . Marital status: Divorced    Spouse name: Not on file  . Number of children: 3  . Years of education:  Not on file  . Highest education level: Not on file  Occupational History  . Occupation: Armed forces technical officer: RETIRED  Tobacco Use  . Smoking status: Former    Current packs/day: 0.00    Average packs/day: 1.5 packs/day for 54.0 years (81.0 ttl pk-yrs)    Types: Cigarettes    Start date: 12/02/1947    Quit date: 12/01/2001    Years since quitting: 22.4  . Smokeless tobacco: Never  Vaping Use  . Vaping status: Never Used  Substance and Sexual Activity  . Alcohol use: No  . Drug use: No  . Sexual activity: Not on file  Other Topics Concern  . Not on file  Social  History Narrative   Lives with sister.    Social Drivers of Corporate investment banker Strain: Low Risk  (02/07/2022)   Overall Financial Resource Strain (CARDIA)   . Difficulty of Paying Living Expenses: Not hard at all  Food Insecurity: Low Risk  (08/06/2023)   Received from [facility]   Hunger Vital Sign   . Worried About Programme researcher, broadcasting/film/video in the Last Year: Never true   . Ran Out of Food in the Last Year: Never true  Transportation Needs: No Transportation Needs (08/06/2023)   Received from [facility]   . In the past 12 months, has lack of reliable transportation kept you from medical appointments, meetings, work or from getting things needed for daily living? : No  Physical Activity: Insufficiently Active (02/07/2022)   Exercise Vital Sign   . Days of Exercise per Week: 3 days   . Minutes of Exercise per Session: 20 min  Stress: No Stress Concern Present (02/07/2022)   [employer] of Occupational Health - Occupational Stress Questionnaire   . Feeling of Stress : Not at all  Social Connections: Socially Isolated (02/07/2022)   Social Connection and Isolation Panel [NHANES]   . Frequency of Communication with Friends and Family: More than three times a week   . Frequency of Social Gatherings with Friends and Family: More than three times a week   . Attends Religious Services: Never   . Active Member of Clubs or Organizations: No   . Attends Banker Meetings: Never   . Marital Status: Divorced  Catering manager Violence: Not At Risk (01/20/2023)   Humiliation, Afraid, Rape, and Kick questionnaire   . Fear of Current or Ex-Partner: No   . Emotionally Abused: No   . Physically Abused: No   . Sexually Abused: No   Family History  Problem Relation Age of Onset  . Lung cancer Mother       VITAL SIGNS BP 138/74   Pulse 62   Temp 98.2 F (36.8 C)   Resp 18   Ht 5' 6.5" (1.689 m)   Wt 144 lb 9.6 oz (65.6 kg)   SpO2 94%   BMI 22.99  kg/m   Outpatient Encounter Medications as of 05/04/2024  Medication Sig  . albuterol  (VENTOLIN  HFA) 108 (90 Base) MCG/ACT inhaler Inhale 2 puffs into the lungs every 6 (six) hours as needed for wheezing or shortness of breath. And neb treatment at HS  . Albuterol  Sulfate 2.5 MG/0.5ML NEBU Inhale 1 each into the lungs 3 (three) times daily.  . ascorbic acid  (VITAMIN C ) 500 MG tablet Take 1,000 mg by mouth daily.  . Budeson-Glycopyrrol-Formoterol  (BREZTRI  AEROSPHERE) 160-9-4.8 MCG/ACT AERO Inhale 2 puffs into the lungs in the morning and at bedtime.  Aaron Aas  cholecalciferol  (VITAMIN D ) 25 MCG (1000 UNIT) tablet Take 3,000 Units by mouth daily.  . feeding supplement (ENSURE ENLIVE / ENSURE PLUS) LIQD Take 237 mLs by mouth 3 (three) times daily between meals.  . ferrous sulfate 325 (65 FE) MG EC tablet Take 325 mg by mouth every Monday, Wednesday, and Friday.  . finasteride  (PROSCAR ) 5 MG tablet Take 1 tablet (5 mg total) by mouth daily.  Aaron Aas levothyroxine (SYNTHROID) 50 MCG tablet Take 50 mcg by mouth daily before breakfast.  . losartan  (COZAAR ) 25 MG tablet Take 12.5 mg by mouth daily.  . methocarbamol (ROBAXIN) 500 MG tablet Take 500 mg by mouth every 8 (eight) hours as needed for muscle spasms.  . OXYGEN  Inhale 2 L into the lungs as directed. Every Shift; Day, Evening, Night  . tamsulosin  (FLOMAX ) 0.4 MG CAPS capsule Take 2 capsules (0.8 mg total) by mouth daily.  Aaron Aas UNABLE TO FIND Diet: Dysphagia 2/Thin Liquids, Consistent carbohydrate   No facility-administered encounter medications on file as of 05/04/2024.     SIGNIFICANT DIAGNOSTIC EXAMS  09-28-23: wbc 9.6; hgb 16.0; hgb 50.4 mcv 95.1 plt 251 glucose 141; bun 28; creat 0.83; k+ 3.9; na++ 137; ca 9.6 gfr >60; protein 7.1; albumin 3.7; hgb A1c 6.9 iron 93 tibc 436; chol 258; ldl 174; trig 163; hdl 51 psa 0.6 tsh 6.055; vitamin D  62.23; ACR 10.9 11-09-23: tsh 3.912 free t4: 1.17 11-27-23: glucose 131; bun 23; creat 0.70; k+ 3.8; na++ 134; ca 9.0 gfr  >60  12-31-22: hgb A1c 7.1; chol 203; ldl 126; trig 187; hdl 40  TODAY  03-14-24: wbc 8.0; hgb 15.1; hct 46.2; mcv 88.8 plt 227; glucose 148; bun 22; creat 0.79;k + 4.0; na++ 139; ca 9.7; gfr >60; protein 6.8 albumin 3.3 hgb A1c 7.2      Review of Systems  Constitutional:  Negative for malaise/fatigue.  Respiratory:  Negative for cough and shortness of breath.   Cardiovascular:  Negative for chest pain, palpitations and leg swelling.  Gastrointestinal:  Negative for abdominal pain, constipation and heartburn.  Musculoskeletal:  Negative for back pain, joint pain and myalgias.  Skin: Negative.   Neurological:  Negative for dizziness.  Psychiatric/Behavioral:  The patient is not nervous/anxious.    Physical Exam Constitutional:      General: He is not in acute distress.    Appearance: He is well-developed. He is not diaphoretic.  Neck:     Thyroid : No thyromegaly.  Cardiovascular:     Rate and Rhythm: Normal rate and regular rhythm.     Pulses: Normal pulses.     Heart sounds: Normal heart sounds.  Pulmonary:     Effort: Pulmonary effort is normal. No respiratory distress.     Breath sounds: Normal breath sounds.  Abdominal:     General: Bowel sounds are normal. There is no distension.     Palpations: Abdomen is soft.     Tenderness: There is no abdominal tenderness.  Musculoskeletal:        General: Normal range of motion.     Cervical back: Neck supple.     Right lower leg: No edema.     Left lower leg: No edema.  Lymphadenopathy:     Cervical: No cervical adenopathy.  Skin:    General: Skin is warm and dry.  Neurological:     Mental Status: He is alert. Mental status is at baseline.     Comments: SLUMS: 15/30   Psychiatric:        Mood  and Affect: Mood normal.     ASSESSMENT/ PLAN:  TODAY   Aortic atherosclerosis (ct 05-23-22) not on statin due to advanced age  84. Hypertension associated with type 2 diabetes mellitus: b/p 138/74: is on asa 81 mg daily   3.  Pulmonary emphysema unspecified emphysema/chronic respiratory failure with hypoxia: has 81 pack year history: will continue breztri  160-9-4.8 mcg 2 puffs twice daily   PREVIOUS   4. Gastroesophageal reflux disease: is off PPI    5. BPH with obstruction urinary symptoms: will continue proscar  5 mg daily and flomax  0.8 mg daily   6. Protein calorie malnutrition, severe: protein 5.6 albumin 2.5 will continue supplements as directed  7. Chronic constipation: is presently not on medications.   8. Iron deficiency anemia hgb 12.3; iron 19; will continue iron three times weekly;   9.Controlled type 2 diabetes mellitus with hyperglycemia (HCC) steroid induced; hgb A1c 6.9 will monitor  10. Vascular dementia without behavioral disturbance (HCC) weight is 144 pounds without significant change; will continue to monitor   Britt Candle NP [facility] Adult [facility]  call [phone]

## 2024-05-18 DIAGNOSIS — H35362 Drusen (degenerative) of macula, left eye: Secondary | ICD-10-CM | POA: Diagnosis not present

## 2024-05-18 DIAGNOSIS — Z961 Presence of intraocular lens: Secondary | ICD-10-CM | POA: Diagnosis not present

## 2024-05-23 ENCOUNTER — Encounter: Payer: Self-pay | Admitting: Family Medicine

## 2024-05-23 ENCOUNTER — Non-Acute Institutional Stay (SKILLED_NURSING_FACILITY): Admitting: Family Medicine

## 2024-05-23 DIAGNOSIS — E039 Hypothyroidism, unspecified: Secondary | ICD-10-CM | POA: Diagnosis not present

## 2024-05-23 DIAGNOSIS — I152 Hypertension secondary to endocrine disorders: Secondary | ICD-10-CM

## 2024-05-23 DIAGNOSIS — F0150 Vascular dementia without behavioral disturbance: Secondary | ICD-10-CM

## 2024-05-23 DIAGNOSIS — E1165 Type 2 diabetes mellitus with hyperglycemia: Secondary | ICD-10-CM

## 2024-05-23 DIAGNOSIS — N138 Other obstructive and reflux uropathy: Secondary | ICD-10-CM

## 2024-05-23 DIAGNOSIS — E1159 Type 2 diabetes mellitus with other circulatory complications: Secondary | ICD-10-CM | POA: Diagnosis not present

## 2024-05-23 DIAGNOSIS — E43 Unspecified severe protein-calorie malnutrition: Secondary | ICD-10-CM

## 2024-05-23 DIAGNOSIS — N401 Enlarged prostate with lower urinary tract symptoms: Secondary | ICD-10-CM

## 2024-05-23 DIAGNOSIS — J439 Emphysema, unspecified: Secondary | ICD-10-CM

## 2024-05-23 DIAGNOSIS — J9611 Chronic respiratory failure with hypoxia: Secondary | ICD-10-CM | POA: Diagnosis not present

## 2024-05-23 DIAGNOSIS — D509 Iron deficiency anemia, unspecified: Secondary | ICD-10-CM | POA: Diagnosis not present

## 2024-05-23 NOTE — Progress Notes (Signed)
 Location:  [facility]  Nursing on room #107 Place of Service:   SNF 31   CODE STATUS: Full  Allergies  Allergen Reactions   Benzyl Alcohol Itching   Amlodipine  Swelling    Swelling in feet   Aspirin  Other (See Comments)    Upset stomach.   Other Hypertension    Hazelnuts   Penicillins Hives    Has patient had a PCN reaction causing immediate rash, facial/tongue/throat swelling, SOB or lightheadedness with hypotension: Yes Has patient had a PCN reaction causing severe rash involving mucus membranes or skin necrosis: No Has patient had a PCN reaction that required hospitalization No Has patient had a PCN reaction occurring within the last 10 years: No If all of the above answers are NO, then may proceed with Cephalosporin use.    Sulfa Antibiotics Hives   Tylenol  [Acetaminophen ] Other (See Comments)    Pt reports he has had liver issues and was told not to take tylenol      No chief complaint on file.   HPI:  Patient is 83 y.o. being seen in nursing home today for management of chronic illness.  Reports that his breathing is doing well on 2 L.  He has no worsening concerns.  He denies any pain at this time.  Inhalers help with his breathing.  Past Medical History:  Diagnosis Date   Arthritis    Chronic rhinitis    Complication of anesthesia    patient usually hs to be intubated with anesthesia   COPD (chronic obstructive pulmonary disease) (HCC)    PFT 06-26-09 FEV1  1.75( 71%), FVC 3.65( 101%), FEV1% 48, TLC 5.53(104%), DLCO 55%, no BD   Dyspnea    Hyperlipidemia    Hypertension    Nasal septal deviation    Nocturia    On home O2    2L N/C   OSA (obstructive sleep apnea) 04/21/2011   Partial small bowel obstruction (HCC) 10/18/2021   Skin cancer     Past Surgical History:  Procedure Laterality Date   APPENDECTOMY     BACK SURGERY     CATARACT EXTRACTION W/PHACO  01/06/2013   Procedure: CATARACT EXTRACTION PHACO AND INTRAOCULAR LENS PLACEMENT (IOC);   Surgeon: Cherene Mania, MD;  Location: [facility];  Service: Ophthalmology;  Laterality: Right;  CDE: 22.17   CATARACT EXTRACTION W/PHACO Left 06/20/2022   Procedure: CATARACT EXTRACTION PHACO AND INTRAOCULAR LENS PLACEMENT (IOC);  Surgeon: Harrie Agent, MD;  Location: [facility];  Service: Ophthalmology;  Laterality: Left;  CDE 20.85   FEMORAL HERNIA REPAIR Right 11/19/2022   Procedure: HERNIA REPAIR FEMORAL WITH MESH;  Surgeon: Kallie Manuelita BROCKS, MD;  Location: [facility];  Service: General;  Laterality: Right;   INGUINAL HERNIA REPAIR Left 12/09/2021   Procedure: HERNIA REPAIR INGUINAL ADULT WITH MESH;  Surgeon: Kallie Manuelita BROCKS, MD;  Location: [facility];  Service: General;  Laterality: Left;   INGUINAL HERNIA REPAIR Right 03/19/2022   Procedure: HERNIA REPAIR INGUINAL ADULT WITH MESH;  Surgeon: Kallie Manuelita BROCKS, MD;  Location: [facility];  Service: General;  Laterality: Right;   ORIF PATELLA Left 01/22/2023   Procedure: OPEN REDUCTION INTERNAL FIXATION (ORIF) PATELLA;  Surgeon: Onesimo Oneil LABOR, MD;  Location: [facility];  Service: Orthopedics;  Laterality: Left;    Social History   Socioeconomic History   Marital status: Divorced    Spouse name: Not on file   Number of children: 3   Years of education: Not on file   Highest education level:  Not on file  Occupational History   Occupation: Armed forces technical officer: RETIRED  Tobacco Use   Smoking status: Former    Current packs/day: 0.00    Average packs/day: 1.5 packs/day for 54.0 years (81.0 ttl pk-yrs)    Types: Cigarettes    Start date: 12/02/1947    Quit date: 12/01/2001    Years since quitting: 22.4   Smokeless tobacco: Never  Vaping Use   Vaping status: Never Used  Substance and Sexual Activity   Alcohol use: No   Drug use: No   Sexual activity: Not on file  Other Topics Concern   Not on file  Social History Narrative   Lives with sister.    Social Drivers of Corporate investment banker Strain: Low Risk  (02/07/2022)   Overall Financial  Resource Strain (CARDIA)    Difficulty of Paying Living Expenses: Not hard at all  Food Insecurity: Low Risk  (08/06/2023)   Received from [facility]   Hunger Vital Sign    Worried About Running Out of Food in the Last Year: Never true    Ran Out of Food in the Last Year: Never true  Transportation Needs: No Transportation Needs (08/06/2023)   Received from [facility]    In the past 12 months, has lack of reliable transportation kept you from medical appointments, meetings, work or from getting things needed for daily living? : No  Physical Activity: Insufficiently Active (02/07/2022)   Exercise Vital Sign    Days of Exercise per Week: 3 days    Minutes of Exercise per Session: 20 min  Stress: No Stress Concern Present (02/07/2022)   [employer] of Occupational Health - Occupational Stress Questionnaire    Feeling of Stress : Not at all  Social Connections: Socially Isolated (02/07/2022)   Social Connection and Isolation Panel    Frequency of Communication with Friends and Family: More than three times a week    Frequency of Social Gatherings with Friends and Family: More than three times a week    Attends Religious Services: Never    Database administrator or Organizations: No    Attends Banker Meetings: Never    Marital Status: Divorced  Catering manager Violence: Not At Risk (01/20/2023)   Humiliation, Afraid, Rape, and Kick questionnaire    Fear of Current or Ex-Partner: No    Emotionally Abused: No    Physically Abused: No    Sexually Abused: No   Family History  Problem Relation Age of Onset   Lung cancer Mother       VITAL SIGNS There were no vitals taken for this visit.  Outpatient Encounter Medications as of 05/23/2024  Medication Sig   albuterol  (VENTOLIN  HFA) 108 (90 Base) MCG/ACT inhaler Inhale 2 puffs into the lungs every 6 (six) hours as needed for wheezing or shortness of breath. And neb treatment at HS   Albuterol  Sulfate  2.5 MG/0.5ML NEBU Inhale 1 each into the lungs 3 (three) times daily.   ascorbic acid  (VITAMIN C ) 500 MG tablet Take 1,000 mg by mouth daily.   Budeson-Glycopyrrol-Formoterol  (BREZTRI  AEROSPHERE) 160-9-4.8 MCG/ACT AERO Inhale 2 puffs into the lungs in the morning and at bedtime.   cholecalciferol  (VITAMIN D ) 25 MCG (1000 UNIT) tablet Take 3,000 Units by mouth daily.   feeding supplement (ENSURE ENLIVE / ENSURE PLUS) LIQD Take 237 mLs by mouth 3 (three) times daily between meals.   ferrous sulfate 325 (65 FE)  MG EC tablet Take 325 mg by mouth every Monday, Wednesday, and Friday.   finasteride  (PROSCAR ) 5 MG tablet Take 1 tablet (5 mg total) by mouth daily.   levothyroxine (SYNTHROID) 50 MCG tablet Take 50 mcg by mouth daily before breakfast.   losartan  (COZAAR ) 25 MG tablet Take 12.5 mg by mouth daily.   methocarbamol (ROBAXIN) 500 MG tablet Take 500 mg by mouth every 8 (eight) hours as needed for muscle spasms.   OXYGEN  Inhale 2 L into the lungs as directed. Every Shift; Day, Evening, Night   tamsulosin  (FLOMAX ) 0.4 MG CAPS capsule Take 2 capsules (0.8 mg total) by mouth daily.   UNABLE TO FIND Diet: Dysphagia 2/Thin Liquids, Consistent carbohydrate   No facility-administered encounter medications on file as of 05/23/2024.     SIGNIFICANT DIAGNOSTIC EXAMS  03-14-24: wbc 8.0; hgb 15.1; hct 46.2; mcv 88.8 plt 227; glucose 148; bun 22; creat 0.79;k + 4.0; na++ 139; ca 9.7; gfr >60; protein 6.8 albumin 3.3 hgb A1c 7.2    General: A&O, NAD, sitting comfortably in wheelchair HEENT: No sign of trauma, EOM grossly intact, moist mucous membranes Cardiac: RRR, no m/r/g Respiratory: CTAB, normal WOB on 2 L oxygen , no w/c/r GI: Soft, NTTP, non-distended, no rebound or guarding Extremities: NTTP, no peripheral edema. Neuro: Moves all four extremities appropriately. Psych: Appropriate mood and affect    ASSESSMENT/ PLAN:  Hypothryoidism - On Synthroid 50mcg daily, repeat TSH ordered HTN - Recent  Bps stable, controlled, continue losartan  25mg  daily. COPD - Stable on 2L [place], continue home oxygen  and breztri . T2DM - A1c 7.2, repeat again 1 month. Diet controlled.  Previous  5. GERD - Off of PPI 6. BPH - continue Flomax  0.8mg  7. IDA - Hemoglobin stable 15.1 2 months ago 8. Vascular dementia - No behavioral issues 9. Protein calorie malnutrition - continue Ensure.   Michael Provencal, MD, PGY-2 [facility] Family Medicine 9:50 AM 05/23/2024

## 2024-05-26 ENCOUNTER — Other Ambulatory Visit (HOSPITAL_COMMUNITY)
Admission: RE | Admit: 2024-05-26 | Discharge: 2024-05-26 | Disposition: A | Discharge status: Home / Self Care | Source: Skilled Nursing Facility | Attending: Adult Health | Admitting: Adult Health

## 2024-05-26 DIAGNOSIS — R627 Adult failure to thrive: Secondary | ICD-10-CM | POA: Diagnosis not present

## 2024-05-26 DIAGNOSIS — E039 Hypothyroidism, unspecified: Secondary | ICD-10-CM | POA: Insufficient documentation

## 2024-05-26 LAB — TSH: TSH: 3.32 u[IU]/mL (ref 0.350–4.500)

## 2024-06-08 NOTE — Addendum Note (Signed)
 Addended by: LANDY BARNIE RAMAN on: 06/08/2024 08:28 AM   Modules accepted: Level of Service

## 2024-06-10 ENCOUNTER — Non-Acute Institutional Stay (SKILLED_NURSING_FACILITY): Payer: Self-pay | Admitting: Adult Health

## 2024-06-10 ENCOUNTER — Encounter: Payer: Self-pay | Admitting: Adult Health

## 2024-06-10 DIAGNOSIS — N138 Other obstructive and reflux uropathy: Secondary | ICD-10-CM | POA: Diagnosis not present

## 2024-06-10 DIAGNOSIS — K219 Gastro-esophageal reflux disease without esophagitis: Secondary | ICD-10-CM | POA: Diagnosis not present

## 2024-06-10 DIAGNOSIS — N401 Enlarged prostate with lower urinary tract symptoms: Secondary | ICD-10-CM

## 2024-06-10 DIAGNOSIS — E43 Unspecified severe protein-calorie malnutrition: Secondary | ICD-10-CM

## 2024-06-10 NOTE — Progress Notes (Unsigned)
 Location:  [facility] Room Number: 61 D Place of Service:  SNF (31)   CODE STATUS: Full Code   Allergies  Allergen Reactions   Benzyl Alcohol Itching   Amlodipine  Swelling    Swelling in feet   Aspirin  Other (See Comments)    Upset stomach.   Other Hypertension    Hazelnuts   Penicillins Hives    Has patient had a PCN reaction causing immediate rash, facial/tongue/throat swelling, SOB or lightheadedness with hypotension: Yes Has patient had a PCN reaction causing severe rash involving mucus membranes or skin necrosis: No Has patient had a PCN reaction that required hospitalization No Has patient had a PCN reaction occurring within the last 10 years: No If all of the above answers are NO, then may proceed with Cephalosporin use.    Sulfa Antibiotics Hives   Tylenol  [Acetaminophen ] Other (See Comments)    Pt reports he has had liver issues and was told not to take tylenol      Chief Complaint  Patient presents with   Medical Management of Chronic Issues    Routine visit. Discuss need for shingrix or post pone if patient refuses     HPI:    Past Medical History:  Diagnosis Date   Arthritis    Chronic rhinitis    Complication of anesthesia    patient usually hs to be intubated with anesthesia   COPD (chronic obstructive pulmonary disease) (HCC)    PFT 06-26-09 FEV1  1.75( 71%), FVC 3.65( 101%), FEV1% 48, TLC 5.53(104%), DLCO 55%, no BD   Dyspnea    Hyperlipidemia    Hypertension    Nasal septal deviation    Nocturia    On home O2    2L N/C   OSA (obstructive sleep apnea) 04/21/2011   Partial small bowel obstruction (HCC) 10/18/2021   Skin cancer     Past Surgical History:  Procedure Laterality Date   APPENDECTOMY     BACK SURGERY     CATARACT EXTRACTION W/PHACO  01/06/2013   Procedure: CATARACT EXTRACTION PHACO AND INTRAOCULAR LENS PLACEMENT (IOC);  Surgeon: Cherene Mania, MD;  Location: [facility];  Service: Ophthalmology;  Laterality:  Right;  CDE: 22.17   CATARACT EXTRACTION W/PHACO Left 06/20/2022   Procedure: CATARACT EXTRACTION PHACO AND INTRAOCULAR LENS PLACEMENT (IOC);  Surgeon: Harrie Agent, MD;  Location: [facility];  Service: Ophthalmology;  Laterality: Left;  CDE 20.85   FEMORAL HERNIA REPAIR Right 11/19/2022   Procedure: HERNIA REPAIR FEMORAL WITH MESH;  Surgeon: Kallie Manuelita BROCKS, MD;  Location: [facility];  Service: General;  Laterality: Right;   INGUINAL HERNIA REPAIR Left 12/09/2021   Procedure: HERNIA REPAIR INGUINAL ADULT WITH MESH;  Surgeon: Kallie Manuelita BROCKS, MD;  Location: [facility];  Service: General;  Laterality: Left;   INGUINAL HERNIA REPAIR Right 03/19/2022   Procedure: HERNIA REPAIR INGUINAL ADULT WITH MESH;  Surgeon: Kallie Manuelita BROCKS, MD;  Location: [facility];  Service: General;  Laterality: Right;   ORIF PATELLA Left 01/22/2023   Procedure: OPEN REDUCTION INTERNAL FIXATION (ORIF) PATELLA;  Surgeon: Onesimo Oneil LABOR, MD;  Location: [facility];  Service: Orthopedics;  Laterality: Left;    Social History   Socioeconomic History   Marital status: Divorced    Spouse name: Not on file   Number of children: 3   Years of education: Not on file   Highest education level: Not on file  Occupational History   Occupation: Armed forces technical officer: RETIRED  Tobacco  Use   Smoking status: Former    Current packs/day: 0.00    Average packs/day: 1.5 packs/day for 54.0 years (81.0 ttl pk-yrs)    Types: Cigarettes    Start date: 12/02/1947    Quit date: 12/01/2001    Years since quitting: 22.5   Smokeless tobacco: Never  Vaping Use   Vaping status: Never Used  Substance and Sexual Activity   Alcohol use: No   Drug use: No   Sexual activity: Not on file  Other Topics Concern   Not on file  Social History Narrative   Lives with sister.    Social Drivers of Corporate investment banker Strain: Low Risk  (02/07/2022)   Overall Financial Resource Strain (CARDIA)    Difficulty of Paying Living Expenses: Not hard at all   Food Insecurity: Low Risk  (08/06/2023)   Received from [facility]   Hunger Vital Sign    Within the past 12 months, you worried that your food would run out before you got money to buy more: Never true    Within the past 12 months, the food you bought just didn't last and you didn't have money to get more. : Never true  Transportation Needs: No Transportation Needs (08/06/2023)   Received from [facility]    In the past 12 months, has lack of reliable transportation kept you from medical appointments, meetings, work or from getting things needed for daily living? : No  Physical Activity: Insufficiently Active (02/07/2022)   Exercise Vital Sign    Days of Exercise per Week: 3 days    Minutes of Exercise per Session: 20 min  Stress: No Stress Concern Present (02/07/2022)   [employer] of Occupational Health - Occupational Stress Questionnaire    Feeling of Stress : Not at all  Social Connections: Socially Isolated (02/07/2022)   Social Connection and Isolation Panel    Frequency of Communication with Friends and Family: More than three times a week    Frequency of Social Gatherings with Friends and Family: More than three times a week    Attends Religious Services: Never    Database administrator or Organizations: No    Attends Banker Meetings: Never    Marital Status: Divorced  Catering manager Violence: Not At Risk (01/20/2023)   Humiliation, Afraid, Rape, and Kick questionnaire    Fear of Current or Ex-Partner: No    Emotionally Abused: No    Physically Abused: No    Sexually Abused: No   Family History  Problem Relation Age of Onset   Lung cancer Mother       VITAL SIGNS BP (!) 143/64   Pulse 72   Ht 5' 6.5 (1.689 m)   Wt 141 lb (64 kg)   SpO2 95%   BMI 22.42 kg/m   Outpatient Encounter Medications as of 06/10/2024  Medication Sig   Albuterol  Sulfate 2.5 MG/0.5ML NEBU Inhale 1 each into the lungs every 6 (six) hours.    Budeson-Glycopyrrol-Formoterol  (BREZTRI  AEROSPHERE) 160-9-4.8 MCG/ACT AERO Inhale 2 puffs into the lungs in the morning and at bedtime.   cholecalciferol  (VITAMIN D ) 25 MCG (1000 UNIT) tablet Take 1,000 Units by mouth daily.   feeding supplement (ENSURE ENLIVE / ENSURE PLUS) LIQD Take 237 mLs by mouth 3 (three) times daily between meals.   ferrous sulfate 325 (65 FE) MG EC tablet Take 325 mg by mouth every Monday, Wednesday, and Friday.   finasteride  (PROSCAR ) 5 MG tablet  Take 1 tablet (5 mg total) by mouth daily.   fluticasone (FLONASE) 50 MCG/ACT nasal spray Place 2 sprays into both nostrils at bedtime.   levothyroxine (SYNTHROID) 50 MCG tablet Take 50 mcg by mouth daily before breakfast.   losartan  (COZAAR ) 25 MG tablet Take 12.5 mg by mouth daily.   Multiple Vitamins-Minerals (PRESERVISION AREDS 2) CAPS Take 1 capsule by mouth 2 (two) times daily.   OXYGEN  Inhale 2 L into the lungs as directed. Every Shift; Day, Evening, Night   tamsulosin  (FLOMAX ) 0.4 MG CAPS capsule Take 2 capsules (0.8 mg total) by mouth daily.   UNABLE TO FIND DIET: DYS 2 MEATS / DYS 3 SIDES / Thin liquids GLUTEN FREE as of 06/10/23 07/01/23 Ground meats with soft vegetables as requested by resident. Does NOT want gravy.  No cranberry juice. Special Instructions: Only wants [place] sausage or bacon for breakfast - no pork   albuterol  (VENTOLIN  HFA) 108 (90 Base) MCG/ACT inhaler Inhale 2 puffs into the lungs every 6 (six) hours as needed for wheezing or shortness of breath. And neb treatment at HS (Patient not taking: Reported on 06/10/2024)   ascorbic acid  (VITAMIN C ) 500 MG tablet Take 1,000 mg by mouth daily. (Patient not taking: Reported on 06/10/2024)   methocarbamol (ROBAXIN) 500 MG tablet Take 500 mg by mouth every 8 (eight) hours as needed for muscle spasms. (Patient not taking: Reported on 06/10/2024)   No facility-administered encounter medications on file as of 06/10/2024.     SIGNIFICANT DIAGNOSTIC  EXAMS       ASSESSMENT/ PLAN:     Barnie Seip NP [facility]  Contact [phone] Monday through Friday 8am- 5pm  After hours call [phone]

## 2024-06-20 DIAGNOSIS — J849 Interstitial pulmonary disease, unspecified: Secondary | ICD-10-CM | POA: Diagnosis not present

## 2024-07-06 ENCOUNTER — Non-Acute Institutional Stay (SKILLED_NURSING_FACILITY): Admitting: Internal Medicine

## 2024-07-06 ENCOUNTER — Encounter: Payer: Self-pay | Admitting: Internal Medicine

## 2024-07-06 DIAGNOSIS — E43 Unspecified severe protein-calorie malnutrition: Secondary | ICD-10-CM | POA: Diagnosis not present

## 2024-07-06 DIAGNOSIS — E1165 Type 2 diabetes mellitus with hyperglycemia: Secondary | ICD-10-CM

## 2024-07-06 DIAGNOSIS — I10 Essential (primary) hypertension: Secondary | ICD-10-CM

## 2024-07-06 DIAGNOSIS — E039 Hypothyroidism, unspecified: Secondary | ICD-10-CM

## 2024-07-06 DIAGNOSIS — L308 Other specified dermatitis: Secondary | ICD-10-CM

## 2024-07-06 DIAGNOSIS — J9611 Chronic respiratory failure with hypoxia: Secondary | ICD-10-CM | POA: Diagnosis not present

## 2024-07-06 NOTE — Progress Notes (Signed)
 NURSING HOME LOCATION:  [facility] ROOM NUMBER:  [id number] D  CODE STATUS: Full Code   PCP: Landy Barnie RAMAN, NP   This is a nursing facility follow up visit of chronic medical diagnoses to document compliance with Regulation 483.30 (c) in The Long Term Care Survey Manual Phase 2 which mandates caregiver visit ( visits can alternate among physician, PA or NP as per statutes) within 10 days of 30 days / 60 days/ 90 days post admission to SNF date  .  Interim medical record and care since last SNF visit was updated with review of diagnostic studies and change in clinical status since last visit were documented.  HPI: He is permanent resident of this facility with medical diagnoses of history of OSA; past medical history of partial small bowel obstruction; oxygen  dependent COPD; dyslipidemia; and essential hypertension. Labs were updated 4/14 and 05/26/2024.A1c indicated adequate control with a value of 7.2%.  TSH was therapeutic at 3.32 .  CBC was normal; chemistries revealed CKD stage II.  Mild progression of protein/caloric malnutrition was suggested with total protein dropping from 7.1 to 6.8 and albumin dropping from 3.7 down to 3.3.  Review of systems: His major complaint is localized area of pruritus and rash over the right flank posteriorly.  He has been using A&D ointment without benefit.  He states this has been present chronically, ever since his last hospitalization.  He also complains of a bump on his jaw.  He states his breathing is  poor.  He has intermittent nonproductive cough.  He does admit to some difficulty urinating but has no other GU symptoms.   Constitutional: No fever, significant weight change  Eyes: No redness, discharge, pain, vision change ENT/mouth: No nasal congestion,  purulent discharge, earache, change in hearing, sore throat  Cardiovascular: No chest pain, palpitations, paroxysmal nocturnal dyspnea, claudication, edema  Respiratory: No sputum  production, hemoptysis, significant snoring, apnea   Gastrointestinal: No heartburn, dysphagia, abdominal pain, nausea /vomiting, rectal bleeding, melena, change in bowels Genitourinary: No dysuria, hematuria, pyuria, incontinence, nocturia Musculoskeletal: No joint stiffness, joint swelling, weakness, pain Neurologic: No dizziness, headache, syncope, seizures, numbness, tingling Psychiatric: No significant anxiety, depression, insomnia, anorexia Endocrine: No change in hair/skin/nails, excessive thirst, excessive hunger, excessive urination  Hematologic/lymphatic: No significant bruising, lymphadenopathy, abnormal bleeding Allergy/immunology: No itchy/watery eyes, significant sneezing, urticaria, angioedema  Physical exam:  Pertinent or positive findings: He appears his age.  He is hard of hearing.  He is Transport planner.  He is completely edentulous.  He tends to keep his mouth agape.  He does exhibit low-grade expiratory rhonchi over the upper airway.  Lungs are silent.  Heart sounds are distant with the rhythm is slightly irregular. Pedal pulses are nonpalpable.  Limb atrophy of the lower extremities is present.  He has slight clubbing of the nailbeds.  There is irregular contour to the left knee which he states is related to a screw from a remote orthopedic procedure.  There is no associated effusion or erythema.  He has a very tiny exostosis along the inferior edge of the right mandible.  There is a quarter sized area of minimal erythema and irregular keratosis or eschar over the right flank.  General appearance: no acute distress, increased work of breathing is present.   Lymphatic: No lymphadenopathy about the head, neck, axilla. Eyes: No conjunctival inflammation or lid edema is present. There is no scleral icterus. Ears:  External ear exam shows no significant lesions or deformities.   Nose:  External nasal examination shows no deformity or inflammation. Nasal mucosa are pink and moist without  lesions, exudates Neck:  No thyromegaly, masses, tenderness noted.    Heart:  No gallop, murmur, click, rub .  Lungs: without wheezes, rhonchi, rales, rubs. Abdomen: Bowel sounds are normal. Abdomen is soft and nontender with no organomegaly, hernias, masses. GU: Deferred  Extremities:  No cyanosis, edema  Neurologic exam :Balance, Rhomberg, finger to nose testing could not be completed due to clinical state Deep tendon reflexes are equal Skin: Warm & dry w/o tenting.  See summary under each active problem in the Problem List with associated updated therapeutic plan

## 2024-07-06 NOTE — Patient Instructions (Signed)
 See assessment and plan under each diagnosis in the problem list and acutely for this visit

## 2024-07-06 NOTE — Assessment & Plan Note (Signed)
 Current TSH is therapeutic at 3.32; no change indicated.

## 2024-07-06 NOTE — Assessment & Plan Note (Signed)
 On current pulmonary toilet regimen and supplemental O2; O2 sats ranged from a low of 94 up to a high of 97 percent.  No change indicated.

## 2024-07-06 NOTE — Assessment & Plan Note (Signed)
 BP controlled; no change in antihypertensive medications

## 2024-07-06 NOTE — Assessment & Plan Note (Addendum)
 Current A1c indicates adequate control with a value of 7.2%.  This can be updated in the next 30 days. If DM control inadequate, Metformin could be initiated as GFR > 60.

## 2024-07-06 NOTE — Assessment & Plan Note (Addendum)
 There is slight progression in the protein/caloric malnutrition with total protein dropping from 7.1 down to 6.8 albumin decreasing from 3.7-3.3.  Nutritionist continues to monitor.

## 2024-07-14 ENCOUNTER — Encounter: Payer: Self-pay | Admitting: Adult Health

## 2024-07-14 NOTE — Progress Notes (Signed)
 Location:  [facility] Room Number: [id number] Place of Service:  SNF (31)   CODE STATUS: dnr   Allergies  Allergen Reactions   Benzyl Alcohol Itching   Amlodipine Swelling    Swelling in feet   Aspirin Other (See Comments)    Upset stomach.   Other Hypertension    Hazelnuts   Penicillins Hives    Has patient had a PCN reaction causing immediate rash, facial/tongue/throat swelling, SOB or lightheadedness with hypotension: Yes Has patient had a PCN reaction causing severe rash involving mucus membranes or skin necrosis: No Has patient had a PCN reaction that required hospitalization No Has patient had a PCN reaction occurring within the last 10 years: No If all of the above answers are NO, then may proceed with Cephalosporin use.    Sulfa Antibiotics Hives   Tylenol [Acetaminophen] Other (See Comments)    Pt reports he has had liver issues and was told not to take tylenol     Chief Complaint  Patient presents with   Acute Visit    Care plan meeting     HPI:  We have come together for his care plan meeting.  BIMS 10/15 mood 6/30: not sleeping well; decreased energy. Uses wheelchair without falls. He is independent with his adl care. He is continent of bladder and bowel. Dietary: feeds self; appetite 76-100%; D2 meats; D3 sides weight is 143.2 pounds. Therapy: none at this time. Activities does participate. We will continue to be followed for her chronic illnesses including: Hypertension associated with type 2 diabetes mellitus   Aortic atherosclerosis   Pulmonary emphysema unspecified emphysema type  Past Medical History:  Diagnosis Date   Arthritis    Chronic rhinitis    Complication of anesthesia    patient usually hs to be intubated with anesthesia   COPD (chronic obstructive pulmonary disease) (HCC)    PFT 06-26-09 FEV1  1.75( 71%), FVC 3.65( 101%), FEV1% 48, TLC 5.53(104%), DLCO 55%, no BD   Dyspnea    Hyperlipidemia    Hypertension    Nasal  septal deviation    Nocturia    On home O2    2L N/C   OSA (obstructive sleep apnea) 04/21/2011   Partial small bowel obstruction (HCC) 10/18/2021   Skin cancer     Past Surgical History:  Procedure Laterality Date   APPENDECTOMY     BACK SURGERY     CATARACT EXTRACTION W/PHACO  01/06/2013   Procedure: CATARACT EXTRACTION PHACO AND INTRAOCULAR LENS PLACEMENT (IOC);  Surgeon: Cherene Mania, MD;  Location: [facility];  Service: Ophthalmology;  Laterality: Right;  CDE: 22.17   CATARACT EXTRACTION W/PHACO Left 06/20/2022   Procedure: CATARACT EXTRACTION PHACO AND INTRAOCULAR LENS PLACEMENT (IOC);  Surgeon: Harrie Agent, MD;  Location: [facility];  Service: Ophthalmology;  Laterality: Left;  CDE 20.85   FEMORAL HERNIA REPAIR Right 11/19/2022   Procedure: HERNIA REPAIR FEMORAL WITH MESH;  Surgeon: Kallie Manuelita BROCKS, MD;  Location: [facility];  Service: General;  Laterality: Right;   INGUINAL HERNIA REPAIR Left 12/09/2021   Procedure: HERNIA REPAIR INGUINAL ADULT WITH MESH;  Surgeon: Kallie Manuelita BROCKS, MD;  Location: [facility];  Service: General;  Laterality: Left;   INGUINAL HERNIA REPAIR Right 03/19/2022   Procedure: HERNIA REPAIR INGUINAL ADULT WITH MESH;  Surgeon: Kallie Manuelita BROCKS, MD;  Location: [facility];  Service: General;  Laterality: Right;   ORIF PATELLA Left 01/22/2023   Procedure: OPEN REDUCTION INTERNAL FIXATION (ORIF) PATELLA;  Surgeon:  Onesimo Oneil LABOR, MD;  Location: [facility];  Service: Orthopedics;  Laterality: Left;    Social History   Socioeconomic History   Marital status: Divorced    Spouse name: Not on file   Number of children: 3   Years of education: Not on file   Highest education level: Not on file  Occupational History   Occupation: Armed forces technical officer: RETIRED  Tobacco Use   Smoking status: Former    Current packs/day: 0.00    Average packs/day: 1.5 packs/day for 54.0 years (81.0 ttl pk-yrs)    Types: Cigarettes    Start date: 12/02/1947    Quit date: 12/01/2001    Years since  quitting: 22.6   Smokeless tobacco: Never  Vaping Use   Vaping status: Never Used  Substance and Sexual Activity   Alcohol use: No   Drug use: No   Sexual activity: Not on file  Other Topics Concern   Not on file  Social History Narrative   Lives with sister.    Social Drivers of Corporate investment banker Strain: Low Risk  (02/07/2022)   Overall Financial Resource Strain (CARDIA)    Difficulty of Paying Living Expenses: Not hard at all  Food Insecurity: Low Risk  (08/06/2023)   Received from [facility]   Hunger Vital Sign    Within the past 12 months, you worried that your food would run out before you got money to buy more: Never true    Within the past 12 months, the food you bought just didn't last and you didn't have money to get more. : Never true  Transportation Needs: No Transportation Needs (08/06/2023)   Received from [employer]    In the past 12 months, has lack of reliable transportation kept you from medical appointments, meetings, work or from getting things needed for daily living? : No  Physical Activity: Insufficiently Active (02/07/2022)   Exercise Vital Sign    Days of Exercise per Week: 3 days    Minutes of Exercise per Session: 20 min  Stress: No Stress Concern Present (02/07/2022)   [employer] of Occupational Health - Occupational Stress Questionnaire    Feeling of Stress : Not at all  Social Connections: Socially Isolated (02/07/2022)   Social Connection and Isolation Panel    Frequency of Communication with Friends and Family: More than three times a week    Frequency of Social Gatherings with Friends and Family: More than three times a week    Attends Religious Services: Never    Database administrator or Organizations: No    Attends Banker Meetings: Never    Marital Status: Divorced  Catering manager Violence: Not At Risk (01/20/2023)   Humiliation, Afraid, Rape, and Kick questionnaire    Fear of Current or  Ex-Partner: No    Emotionally Abused: No    Physically Abused: No    Sexually Abused: No   Family History  Problem Relation Age of Onset   Lung cancer Mother       VITAL SIGNS BP 130/80   Pulse 73   Temp 98.8 F (37.1 C)   Resp 20   Ht 5' 6.5 (1.689 m)   Wt 143 lb 3.2 oz (65 kg)   SpO2 98%   BMI 22.77 kg/m   Outpatient Encounter Medications as of 07/15/2024  Medication Sig   Albuterol  Sulfate 2.5 MG/0.5ML NEBU Inhale 1 each into the lungs every 6 (six)  hours as needed.   ascorbic acid (VITAMIN C) 500 MG tablet Take 1,000 mg by mouth daily. (Patient not taking: Reported on 06/10/2024)   Budeson-Glycopyrrol-Formoterol (BREZTRI AEROSPHERE) 160-9-4.8 MCG/ACT AERO Inhale 2 puffs into the lungs in the morning and at bedtime.   cholecalciferol (VITAMIN D) 25 MCG (1000 UNIT) tablet Take 1,000 Units by mouth daily.   feeding supplement (ENSURE ENLIVE / ENSURE PLUS) LIQD Take 237 mLs by mouth 3 (three) times daily between meals.   ferrous sulfate 325 (65 FE) MG EC tablet Take 325 mg by mouth every Monday, Wednesday, and Friday.   finasteride (PROSCAR) 5 MG tablet Take 1 tablet (5 mg total) by mouth daily.   fluticasone (FLONASE) 50 MCG/ACT nasal spray Place 2 sprays into both nostrils at bedtime.   levothyroxine (SYNTHROID) 50 MCG tablet Take 50 mcg by mouth daily before breakfast.   losartan (COZAAR) 25 MG tablet Take 12.5 mg by mouth daily.   methocarbamol (ROBAXIN) 500 MG tablet Take 500 mg by mouth every 8 (eight) hours as needed for muscle spasms. (Patient not taking: Reported on 06/10/2024)   Multiple Vitamins-Minerals (PRESERVISION AREDS 2) CAPS Take 1 capsule by mouth 2 (two) times daily.   OXYGEN Inhale 2 L into the lungs as directed. Every Shift; Day, Evening, Night   tamsulosin (FLOMAX) 0.4 MG CAPS capsule Take 2 capsules (0.8 mg total) by mouth daily.   UNABLE TO FIND DIET: DYS 2 MEATS / DYS 3 SIDES / Thin liquids GLUTEN FREE as of 06/10/23 07/01/23 Ground meats with soft  vegetables as requested by resident. Does NOT want gravy.  No cranberry juice. Special Instructions: Only wants [place] sausage or bacon for breakfast - no pork   [DISCONTINUED] albuterol (VENTOLIN HFA) 108 (90 Base) MCG/ACT inhaler Inhale 2 puffs into the lungs every 6 (six) hours as needed for wheezing or shortness of breath. And neb treatment at HS (Patient not taking: Reported on 06/10/2024)   No facility-administered encounter medications on file as of 07/15/2024.     SIGNIFICANT DIAGNOSTIC EXAMS  09-28-23: wbc 9.6; hgb 16.0; hgb 50.4 mcv 95.1 plt 251 glucose 141; bun 28; creat 0.83; k+ 3.9; na++ 137; ca 9.6 gfr >60; protein 7.1; albumin 3.7; hgb A1c 6.9 iron 93 tibc 436; chol 258; ldl 174; trig 163; hdl 51 psa 0.6 tsh 6.055; vitamin D 62.23; ACR 10.9 11-09-23: tsh 3.912 free t4: 1.17 11-27-23: glucose 131; bun 23; creat 0.70; k+ 3.8; na++ 134; ca 9.0 gfr >60  12-31-22: hgb A1c 7.1; chol 203; ldl 126; trig 187; hdl 40 5-85-74: wbc 8.0; hgb 15.1; hct 46.2; mcv 88.8 plt 227; glucose 148; bun 22; creat 0.79;k + 4.0; na++ 139; ca 9.7; gfr >60; protein 6.8 albumin 3.3 hgb A1c 7.2   NO NEW LABS.      Review of Systems  Constitutional:  Negative for malaise/fatigue.  Respiratory:  Negative for cough and shortness of breath.   Cardiovascular:  Negative for chest pain, palpitations and leg swelling.  Gastrointestinal:  Negative for abdominal pain, constipation and heartburn.  Musculoskeletal:  Negative for back pain, joint pain and myalgias.  Skin: Negative.   Neurological:  Negative for dizziness.  Psychiatric/Behavioral:  The patient is not nervous/anxious.     Physical Exam Constitutional:      General: He is not in acute distress.    Appearance: He is well-developed. He is not diaphoretic.  Neck:     Thyroid: No thyromegaly.  Cardiovascular:     Rate and Rhythm: Normal rate and  regular rhythm.     Heart sounds: Normal heart sounds.  Pulmonary:     Effort: Pulmonary effort is normal.  No respiratory distress.     Breath sounds: Normal breath sounds.  Abdominal:     General: Bowel sounds are normal. There is no distension.     Palpations: Abdomen is soft.     Tenderness: There is no abdominal tenderness.  Musculoskeletal:        General: Normal range of motion.     Cervical back: Neck supple.     Right lower leg: No edema.     Left lower leg: No edema.  Lymphadenopathy:     Cervical: No cervical adenopathy.  Skin:    General: Skin is warm and dry.  Neurological:     Mental Status: He is alert. Mental status is at baseline.     Comments: SLUMS: 15/30   Psychiatric:        Mood and Affect: Mood normal.      ASSESSMENT/ PLAN:  TODAY  Hypertension associated with type 2 diabetes mellitus Aortic atherosclerosis Pulmonary emphysema unspecified emphysema type  Will changes albuterol neb to every 6 hours as needed  Will continue current plan of care Will continue to monitor his status.   Time spent with patient: 40 minutes: medications; dietary; plan of care.    Barnie Seip NP [facility]   call [phone]

## 2024-07-15 ENCOUNTER — Non-Acute Institutional Stay (SKILLED_NURSING_FACILITY): Admitting: Adult Health

## 2024-07-15 DIAGNOSIS — J439 Emphysema, unspecified: Secondary | ICD-10-CM | POA: Diagnosis not present

## 2024-07-15 DIAGNOSIS — I152 Hypertension secondary to endocrine disorders: Secondary | ICD-10-CM | POA: Diagnosis not present

## 2024-07-15 DIAGNOSIS — I700 Atherosclerosis of aorta: Secondary | ICD-10-CM

## 2024-07-15 DIAGNOSIS — E1159 Type 2 diabetes mellitus with other circulatory complications: Secondary | ICD-10-CM | POA: Diagnosis not present

## 2024-08-09 ENCOUNTER — Non-Acute Institutional Stay (SKILLED_NURSING_FACILITY): Payer: Self-pay | Admitting: Adult Health

## 2024-08-09 ENCOUNTER — Encounter: Payer: Self-pay | Admitting: Adult Health

## 2024-08-09 DIAGNOSIS — K5909 Other constipation: Secondary | ICD-10-CM | POA: Diagnosis not present

## 2024-08-09 DIAGNOSIS — D509 Iron deficiency anemia, unspecified: Secondary | ICD-10-CM

## 2024-08-09 DIAGNOSIS — E1165 Type 2 diabetes mellitus with hyperglycemia: Secondary | ICD-10-CM

## 2024-08-09 NOTE — Progress Notes (Signed)
 Location:  [facility] Room Number: 64 [id number] Place of Service:  SNF (31)   CODE STATUS: Full Code   Allergies  Allergen Reactions   Benzyl Alcohol Itching   Amlodipine  Swelling    Swelling in feet   Aspirin  Other (See Comments)    Upset stomach.   Other Hypertension    Hazelnuts   Penicillins Hives    Has patient had a PCN reaction causing immediate rash, facial/tongue/throat swelling, SOB or lightheadedness with hypotension: Yes Has patient had a PCN reaction causing severe rash involving mucus membranes or skin necrosis: No Has patient had a PCN reaction that required hospitalization No Has patient had a PCN reaction occurring within the last 10 years: No If all of the above answers are NO, then may proceed with Cephalosporin use.    Sulfa Antibiotics Hives   Tylenol  [Acetaminophen ] Other (See Comments)    Pt reports he has had liver issues and was told not to take tylenol      Chief Complaint  Patient presents with   Medical Management of Chronic Issues             Chronic constipation: Iron deficiency anemia: Chronic type 2 diabetes mellitus with hyperglycemia steroid induced    HPI:  He is a 83 y.o. long term resident of this facility being seen for the management of his chronic illnesses:  Chronic constipation: Iron deficiency anemia: Chronic type 2 diabetes mellitus with hyperglycemia steroid induced. There are no reports of shortness of breath or coughing. No reports of uncontrolled pain.    Past Medical History:  Diagnosis Date   Arthritis    Chronic rhinitis    Complication of anesthesia    patient usually hs to be intubated with anesthesia   COPD (chronic obstructive pulmonary disease) (HCC)    PFT 06-26-09 FEV1  1.75( 71%), FVC 3.65( 101%), FEV1% 48, TLC 5.53(104%), DLCO 55%, no BD   Dyspnea    Hyperlipidemia    Hypertension    Nasal septal deviation    Nocturia    On home O2    2L N/C   OSA (obstructive sleep apnea) 04/21/2011    Partial small bowel obstruction (HCC) 10/18/2021   Skin cancer     Past Surgical History:  Procedure Laterality Date   APPENDECTOMY     BACK SURGERY     CATARACT EXTRACTION W/PHACO  01/06/2013   Procedure: CATARACT EXTRACTION PHACO AND INTRAOCULAR LENS PLACEMENT (IOC);  Surgeon: Cherene Mania, MD;  Location: [facility];  Service: Ophthalmology;  Laterality: Right;  CDE: 22.17   CATARACT EXTRACTION W/PHACO Left 06/20/2022   Procedure: CATARACT EXTRACTION PHACO AND INTRAOCULAR LENS PLACEMENT (IOC);  Surgeon: Harrie Agent, MD;  Location: [facility];  Service: Ophthalmology;  Laterality: Left;  CDE 20.85   FEMORAL HERNIA REPAIR Right 11/19/2022   Procedure: HERNIA REPAIR FEMORAL WITH MESH;  Surgeon: Kallie Manuelita BROCKS, MD;  Location: [facility];  Service: General;  Laterality: Right;   INGUINAL HERNIA REPAIR Left 12/09/2021   Procedure: HERNIA REPAIR INGUINAL ADULT WITH MESH;  Surgeon: Kallie Manuelita BROCKS, MD;  Location: [facility];  Service: General;  Laterality: Left;   INGUINAL HERNIA REPAIR Right 03/19/2022   Procedure: HERNIA REPAIR INGUINAL ADULT WITH MESH;  Surgeon: Kallie Manuelita BROCKS, MD;  Location: [facility];  Service: General;  Laterality: Right;   ORIF PATELLA Left 01/22/2023   Procedure: OPEN REDUCTION INTERNAL FIXATION (ORIF) PATELLA;  Surgeon: Onesimo Oneil LABOR, MD;  Location: [facility];  Service: Orthopedics;  Laterality: Left;    Social History   Socioeconomic History   Marital status: Divorced    Spouse name: Not on file   Number of children: 3   Years of education: Not on file   Highest education level: Not on file  Occupational History   Occupation: Armed forces technical officer: RETIRED  Tobacco Use   Smoking status: Former    Current packs/day: 0.00    Average packs/day: 1.5 packs/day for 54.0 years (81.0 ttl pk-yrs)    Types: Cigarettes    Start date: 12/02/1947    Quit date: 12/01/2001    Years since quitting: 22.7   Smokeless tobacco: Never  Vaping Use   Vaping status: Never Used  Substance and  Sexual Activity   Alcohol use: No   Drug use: No   Sexual activity: Not on file  Other Topics Concern   Not on file  Social History Narrative   Lives with sister.    Social Drivers of Corporate investment banker Strain: Low Risk  (02/07/2022)   Overall Financial Resource Strain (CARDIA)    Difficulty of Paying Living Expenses: Not hard at all  Food Insecurity: Low Risk  (08/06/2023)   Received from [facility]   Hunger Vital Sign    Within the past 12 months, you worried that your food would run out before you got money to buy more: Never true    Within the past 12 months, the food you bought just didn't last and you didn't have money to get more. : Never true  Transportation Needs: No Transportation Needs (08/06/2023)   Received from [employer]    In the past 12 months, has lack of reliable transportation kept you from medical appointments, meetings, work or from getting things needed for daily living? : No  Physical Activity: Insufficiently Active (02/07/2022)   Exercise Vital Sign    Days of Exercise per Week: 3 days    Minutes of Exercise per Session: 20 min  Stress: No Stress Concern Present (02/07/2022)   [employer] of Occupational Health - Occupational Stress Questionnaire    Feeling of Stress : Not at all  Social Connections: Socially Isolated (02/07/2022)   Social Connection and Isolation Panel    Frequency of Communication with Friends and Family: More than three times a week    Frequency of Social Gatherings with Friends and Family: More than three times a week    Attends Religious Services: Never    Database administrator or Organizations: No    Attends Banker Meetings: Never    Marital Status: Divorced  Catering manager Violence: Not At Risk (01/20/2023)   Humiliation, Afraid, Rape, and Kick questionnaire    Fear of Current or Ex-Partner: No    Emotionally Abused: No    Physically Abused: No    Sexually Abused: No   Family  History  Problem Relation Age of Onset   Lung cancer Mother       VITAL SIGNS BP 132/73   Pulse 84   Ht 5' 6.5 (1.689 m)   Wt 141 lb (64 kg)   BMI 22.42 kg/m   Outpatient Encounter Medications as of 08/09/2024  Medication Sig   Albuterol  Sulfate 2.5 MG/0.5ML NEBU Inhale 1 each into the lungs every 6 (six) hours as needed.   Budeson-Glycopyrrol-Formoterol  (BREZTRI  AEROSPHERE) 160-9-4.8 MCG/ACT AERO Inhale 2 puffs into the lungs in the morning and at bedtime.   cholecalciferol  (VITAMIN D ) 25  MCG (1000 UNIT) tablet Take 1,000 Units by mouth daily.   feeding supplement (ENSURE ENLIVE / ENSURE PLUS) LIQD Take 237 mLs by mouth 3 (three) times daily between meals.   ferrous sulfate 325 (65 FE) MG EC tablet Take 325 mg by mouth every Monday, Wednesday, and Friday.   finasteride  (PROSCAR ) 5 MG tablet Take 1 tablet (5 mg total) by mouth daily.   fluticasone (FLONASE) 50 MCG/ACT nasal spray Place 2 sprays into both nostrils at bedtime.   levothyroxine (SYNTHROID) 50 MCG tablet Take 50 mcg by mouth daily before breakfast.   losartan  (COZAAR ) 25 MG tablet Take 12.5 mg by mouth daily.   Multiple Vitamins-Minerals (PRESERVISION AREDS 2) CAPS Take 1 capsule by mouth 2 (two) times daily.   OXYGEN  Inhale 2 L into the lungs as directed. Every Shift; Day, Evening, Night   tamsulosin  (FLOMAX ) 0.4 MG CAPS capsule Take 2 capsules (0.8 mg total) by mouth daily.   UNABLE TO FIND DIET: DYS 2 MEATS / DYS 3 SIDES / Thin liquids GLUTEN FREE as of 06/10/23 07/01/23 Ground meats with soft vegetables as requested by resident. Does NOT want gravy.  No cranberry juice. Special Instructions: Only wants [place] sausage or bacon for breakfast - no pork   No facility-administered encounter medications on file as of 08/09/2024.     SIGNIFICANT DIAGNOSTIC EXAMS  09-28-23: wbc 9.6; hgb 16.0; hgb 50.4 mcv 95.1 plt 251 glucose 141; bun 28; creat 0.83; k+ 3.9; na++ 137; ca 9.6 gfr >60; protein 7.1; albumin 3.7; hgb A1c 6.9 iron  93 tibc 436; chol 258; ldl 174; trig 163; hdl 51 psa 0.6 tsh 6.055; vitamin D  62.23; ACR 10.9 11-09-23: tsh 3.912 free t4: 1.17 11-27-23: glucose 131; bun 23; creat 0.70; k+ 3.8; na++ 134; ca 9.0 gfr >60  12-31-22: hgb A1c 7.1; chol 203; ldl 126; trig 187; hdl 40 5-85-74: wbc 8.0; hgb 15.1; hct 46.2; mcv 88.8 plt 227; glucose 148; bun 22; creat 0.79;k + 4.0; na++ 139; ca 9.7; gfr >60; protein 6.8 albumin 3.3 hgb A1c 7.2   NO NEW LABS.       Review of Systems  Constitutional:  Negative for malaise/fatigue.  Respiratory:  Negative for cough and shortness of breath.   Cardiovascular:  Negative for chest pain, palpitations and leg swelling.  Gastrointestinal:  Negative for abdominal pain, constipation and heartburn.  Musculoskeletal:  Negative for back pain, joint pain and myalgias.  Skin: Negative.   Neurological:  Negative for dizziness.  Psychiatric/Behavioral:  The patient is not nervous/anxious.     Physical Exam Constitutional:      General: He is not in acute distress.    Appearance: He is well-developed. He is not diaphoretic.  Neck:     Thyroid : No thyromegaly.  Cardiovascular:     Rate and Rhythm: Normal rate and regular rhythm.     Heart sounds: Normal heart sounds.  Pulmonary:     Effort: Pulmonary effort is normal. No respiratory distress.     Breath sounds: Normal breath sounds.  Abdominal:     General: Bowel sounds are normal. There is no distension.     Palpations: Abdomen is soft.     Tenderness: There is no abdominal tenderness.  Musculoskeletal:        General: Normal range of motion.     Cervical back: Neck supple.     Right lower leg: No edema.     Left lower leg: No edema.  Lymphadenopathy:     Cervical: No cervical  adenopathy.  Skin:    General: Skin is warm and dry.  Neurological:     Mental Status: He is alert. Mental status is at baseline.     Comments: SLUMS: 15/30  Psychiatric:        Mood and Affect: Mood normal.        ASSESSMENT/  PLAN:  TODAY  Chronic constipation: is presently not on medications  2. Iron deficiency anemia: hgb 12.3iron 19 will continue iron three times weekly   3. Chronic type 2 diabetes mellitus with hyperglycemia steroid induced: hgb A1c 6.9; will monitor    PREVIOUS   4. Vascular dementia without behavioral disturbance (HCC) weight is 141 pounds without significant change; will continue to monitor   5. Aortic atherosclerosis (ct 05-23-22) not on statin due to advanced age  20. Hypertension associated with type 2 diabetes mellitus: b/p 132/73: is on asa 81 mg daily   7. Pulmonary emphysema unspecified emphysema/chronic respiratory failure with hypoxia: has 81 pack year history: will continue breztri  160-9-4.8 mcg 2 puffs twice daily   8. Gastroesophageal reflux disease: is off PPI  9. BPH with obstruction urinary symptoms: will continue proscar  5 mg daily and flomax  0.8 mg daily   10. Protein calorie malnutrition, severe: protein 5.6 albumin 2.5 will continue supplements as directed.      Barnie Seip NP [facility]   call [phone]

## 2024-08-24 ENCOUNTER — Encounter: Payer: Self-pay | Admitting: Adult Health

## 2024-08-24 ENCOUNTER — Non-Acute Institutional Stay (SKILLED_NURSING_FACILITY): Payer: Self-pay | Admitting: Adult Health

## 2024-08-24 DIAGNOSIS — N401 Enlarged prostate with lower urinary tract symptoms: Secondary | ICD-10-CM

## 2024-08-24 DIAGNOSIS — N138 Other obstructive and reflux uropathy: Secondary | ICD-10-CM

## 2024-08-24 NOTE — Progress Notes (Unsigned)
 Location:  [facility] Room Number: [id number] Place of Service:  SNF (31)   CODE STATUS: full code   Allergies  Allergen Reactions   Benzyl Alcohol Itching   Amlodipine  Swelling    Swelling in feet   Aspirin  Other (See Comments)    Upset stomach.   Other Hypertension    Hazelnuts   Penicillins Hives    Has patient had a PCN reaction causing immediate rash, facial/tongue/throat swelling, SOB or lightheadedness with hypotension: Yes Has patient had a PCN reaction causing severe rash involving mucus membranes or skin necrosis: No Has patient had a PCN reaction that required hospitalization No Has patient had a PCN reaction occurring within the last 10 years: No If all of the above answers are NO, then may proceed with Cephalosporin use.    Sulfa Antibiotics Hives   Tylenol  [Acetaminophen ] Other (See Comments)    Pt reports he has had liver issues and was told not to take tylenol      Chief Complaint  Patient presents with   Acute Visit    Urinary urgency     HPI:  He is complaining of urinary urgency stating that he needs to void every 10 minutes is worse at night. He has a weak stream and at times has difficulty with voiding. He denies any dysuria; no pain; no worsening incontinence. He is presently taking proscar  and flomax  daily.   Past Medical History:  Diagnosis Date   Arthritis    Chronic rhinitis    Complication of anesthesia    patient usually hs to be intubated with anesthesia   COPD (chronic obstructive pulmonary disease) (HCC)    PFT 06-26-09 FEV1  1.75( 71%), FVC 3.65( 101%), FEV1% 48, TLC 5.53(104%), DLCO 55%, no BD   Dyspnea    Hyperlipidemia    Hypertension    Nasal septal deviation    Nocturia    On home O2    2L N/C   OSA (obstructive sleep apnea) 04/21/2011   Partial small bowel obstruction (HCC) 10/18/2021   Skin cancer     Past Surgical History:  Procedure Laterality Date   APPENDECTOMY     BACK SURGERY     CATARACT  EXTRACTION W/PHACO  01/06/2013   Procedure: CATARACT EXTRACTION PHACO AND INTRAOCULAR LENS PLACEMENT (IOC);  Surgeon: Cherene Mania, MD;  Location: [facility];  Service: Ophthalmology;  Laterality: Right;  CDE: 22.17   CATARACT EXTRACTION W/PHACO Left 06/20/2022   Procedure: CATARACT EXTRACTION PHACO AND INTRAOCULAR LENS PLACEMENT (IOC);  Surgeon: Harrie Agent, MD;  Location: [facility];  Service: Ophthalmology;  Laterality: Left;  CDE 20.85   FEMORAL HERNIA REPAIR Right 11/19/2022   Procedure: HERNIA REPAIR FEMORAL WITH MESH;  Surgeon: Kallie Manuelita BROCKS, MD;  Location: [facility];  Service: General;  Laterality: Right;   INGUINAL HERNIA REPAIR Left 12/09/2021   Procedure: HERNIA REPAIR INGUINAL ADULT WITH MESH;  Surgeon: Kallie Manuelita BROCKS, MD;  Location: [facility];  Service: General;  Laterality: Left;   INGUINAL HERNIA REPAIR Right 03/19/2022   Procedure: HERNIA REPAIR INGUINAL ADULT WITH MESH;  Surgeon: Kallie Manuelita BROCKS, MD;  Location: [facility];  Service: General;  Laterality: Right;   ORIF PATELLA Left 01/22/2023   Procedure: OPEN REDUCTION INTERNAL FIXATION (ORIF) PATELLA;  Surgeon: Onesimo Oneil LABOR, MD;  Location: [facility];  Service: Orthopedics;  Laterality: Left;    Social History   Socioeconomic History   Marital status: Divorced    Spouse name: Not on file  Number of children: 3   Years of education: Not on file   Highest education level: Not on file  Occupational History   Occupation: Armed forces technical officer: RETIRED  Tobacco Use   Smoking status: Former    Current packs/day: 0.00    Average packs/day: 1.5 packs/day for 54.0 years (81.0 ttl pk-yrs)    Types: Cigarettes    Start date: 12/02/1947    Quit date: 12/01/2001    Years since quitting: 22.7   Smokeless tobacco: Never  Vaping Use   Vaping status: Never Used  Substance and Sexual Activity   Alcohol use: No   Drug use: No   Sexual activity: Not on file  Other Topics Concern   Not on file  Social History Narrative   Lives with sister.     Social Drivers of Corporate investment banker Strain: Low Risk  (02/07/2022)   Overall Financial Resource Strain (CARDIA)    Difficulty of Paying Living Expenses: Not hard at all  Food Insecurity: Low Risk  (08/06/2023)   Received from [facility]   Hunger Vital Sign    Within the past 12 months, you worried that your food would run out before you got money to buy more: Never true    Within the past 12 months, the food you bought just didn't last and you didn't have money to get more. : Never true  Transportation Needs: No Transportation Needs (08/06/2023)   Received from [employer]    In the past 12 months, has lack of reliable transportation kept you from medical appointments, meetings, work or from getting things needed for daily living? : No  Physical Activity: Insufficiently Active (02/07/2022)   Exercise Vital Sign    Days of Exercise per Week: 3 days    Minutes of Exercise per Session: 20 min  Stress: No Stress Concern Present (02/07/2022)   [employer] of Occupational Health - Occupational Stress Questionnaire    Feeling of Stress : Not at all  Social Connections: Socially Isolated (02/07/2022)   Social Connection and Isolation Panel    Frequency of Communication with Friends and Family: More than three times a week    Frequency of Social Gatherings with Friends and Family: More than three times a week    Attends Religious Services: Never    Database administrator or Organizations: No    Attends Banker Meetings: Never    Marital Status: Divorced  Catering manager Violence: Not At Risk (01/20/2023)   Humiliation, Afraid, Rape, and Kick questionnaire    Fear of Current or Ex-Partner: No    Emotionally Abused: No    Physically Abused: No    Sexually Abused: No   Family History  Problem Relation Age of Onset   Lung cancer Mother       VITAL SIGNS BP 136/70   Pulse 74   Temp 98.1 F (36.7 C)   Resp 20   Ht 5' 6.5 (1.689 m)    Wt 141 lb (64 kg)   SpO2 96%   BMI 22.42 kg/m   Outpatient Encounter Medications as of 08/24/2024  Medication Sig   Albuterol  Sulfate 2.5 MG/0.5ML NEBU Inhale 1 each into the lungs every 6 (six) hours as needed.   Budeson-Glycopyrrol-Formoterol  (BREZTRI  AEROSPHERE) 160-9-4.8 MCG/ACT AERO Inhale 2 puffs into the lungs in the morning and at bedtime.   cholecalciferol  (VITAMIN D ) 25 MCG (1000 UNIT) tablet Take 1,000 Units by mouth daily.  feeding supplement (ENSURE ENLIVE / ENSURE PLUS) LIQD Take 237 mLs by mouth 3 (three) times daily between meals.   ferrous sulfate 325 (65 FE) MG EC tablet Take 325 mg by mouth every Monday, Wednesday, and Friday.   finasteride  (PROSCAR ) 5 MG tablet Take 1 tablet (5 mg total) by mouth daily.   fluticasone (FLONASE) 50 MCG/ACT nasal spray Place 2 sprays into both nostrils at bedtime.   levothyroxine (SYNTHROID) 50 MCG tablet Take 50 mcg by mouth daily before breakfast.   losartan  (COZAAR ) 25 MG tablet Take 12.5 mg by mouth daily.   Multiple Vitamins-Minerals (PRESERVISION AREDS 2) CAPS Take 1 capsule by mouth 2 (two) times daily.   OXYGEN  Inhale 2 L into the lungs as directed. Every Shift; Day, Evening, Night   tamsulosin  (FLOMAX ) 0.4 MG CAPS capsule Take 2 capsules (0.8 mg total) by mouth daily.   UNABLE TO FIND DIET: DYS 2 MEATS / DYS 3 SIDES / Thin liquids GLUTEN FREE as of 06/10/23 07/01/23 Ground meats with soft vegetables as requested by resident. Does NOT want gravy.  No cranberry juice. Special Instructions: Only wants [place] sausage or bacon for breakfast - no pork   No facility-administered encounter medications on file as of 08/24/2024.     SIGNIFICANT DIAGNOSTIC EXAMS  09-28-23: wbc 9.6; hgb 16.0; hgb 50.4 mcv 95.1 plt 251 glucose 141; bun 28; creat 0.83; k+ 3.9; na++ 137; ca 9.6 gfr >60; protein 7.1; albumin 3.7; hgb A1c 6.9 iron 93 tibc 436; chol 258; ldl 174; trig 163; hdl 51 psa 0.6 tsh 6.055; vitamin D  62.23; ACR 10.9 11-09-23: tsh 3.912 free  t4: 1.17 11-27-23: glucose 131; bun 23; creat 0.70; k+ 3.8; na++ 134; ca 9.0 gfr >60  12-31-22: hgb A1c 7.1; chol 203; ldl 126; trig 187; hdl 40 5-85-74: wbc 8.0; hgb 15.1; hct 46.2; mcv 88.8 plt 227; glucose 148; bun 22; creat 0.79;k + 4.0; na++ 139; ca 9.7; gfr >60; protein 6.8 albumin 3.3 hgb A1c 7.2   NO NEW LABS.   Review of Systems  Constitutional:  Negative for malaise/fatigue.  Respiratory:  Negative for cough and shortness of breath.   Cardiovascular:  Negative for chest pain, palpitations and leg swelling.  Gastrointestinal:  Negative for abdominal pain, constipation and heartburn.  Genitourinary:  Positive for frequency and urgency. Negative for dysuria, flank pain and hematuria.  Musculoskeletal:  Negative for back pain, joint pain and myalgias.  Skin: Negative.   Neurological:  Negative for dizziness.  Psychiatric/Behavioral:  The patient is not nervous/anxious.     Physical Exam Constitutional:      General: He is not in acute distress.    Appearance: He is well-developed. He is not diaphoretic.  Neck:     Thyroid : No thyromegaly.  Cardiovascular:     Rate and Rhythm: Normal rate and regular rhythm.     Heart sounds: Normal heart sounds.  Pulmonary:     Effort: Pulmonary effort is normal. No respiratory distress.     Breath sounds: Normal breath sounds.  Abdominal:     General: Bowel sounds are normal. There is no distension.     Palpations: Abdomen is soft.     Tenderness: There is no abdominal tenderness.  Musculoskeletal:        General: Normal range of motion.     Cervical back: Neck supple.     Right lower leg: No edema.     Left lower leg: No edema.  Lymphadenopathy:     Cervical: No cervical adenopathy.  Skin:    General: Skin is warm and dry.  Neurological:     Mental Status: He is alert. Mental status is at baseline.     Comments:  SLUMS: 15/30  Psychiatric:        Mood and Affect: Mood normal.       ASSESSMENT/ PLAN:  TODAY  BPH with  obstruction/urinary symptoms: is on flomax  and proscar ; will being vesicare 5 mg daily to help with the frequent urination.    Barnie Seip NP [facility]   call [phone]

## 2024-09-05 DIAGNOSIS — L602 Onychogryphosis: Secondary | ICD-10-CM | POA: Diagnosis not present

## 2024-09-05 DIAGNOSIS — L603 Nail dystrophy: Secondary | ICD-10-CM | POA: Diagnosis not present

## 2024-09-05 DIAGNOSIS — I739 Peripheral vascular disease, unspecified: Secondary | ICD-10-CM | POA: Diagnosis not present

## 2024-09-06 ENCOUNTER — Encounter: Payer: Self-pay | Admitting: Adult Health

## 2024-09-06 ENCOUNTER — Non-Acute Institutional Stay (SKILLED_NURSING_FACILITY): Payer: Self-pay | Admitting: Adult Health

## 2024-09-06 DIAGNOSIS — N401 Enlarged prostate with lower urinary tract symptoms: Secondary | ICD-10-CM

## 2024-09-06 DIAGNOSIS — N138 Other obstructive and reflux uropathy: Secondary | ICD-10-CM | POA: Diagnosis not present

## 2024-09-06 DIAGNOSIS — F0150 Vascular dementia without behavioral disturbance: Secondary | ICD-10-CM

## 2024-09-06 DIAGNOSIS — I700 Atherosclerosis of aorta: Secondary | ICD-10-CM

## 2024-09-06 NOTE — Progress Notes (Signed)
 Location:  [facility] Room Number: 109 Place of Service:  SNF (31)   CODE STATUS: full   Allergies  Allergen Reactions   Benzyl Alcohol Itching   Amlodipine  Swelling    Swelling in feet   Aspirin  Other (See Comments)    Upset stomach.   Other Hypertension    Hazelnuts   Penicillins Hives    Has patient had a PCN reaction causing immediate rash, facial/tongue/throat swelling, SOB or lightheadedness with hypotension: Yes Has patient had a PCN reaction causing severe rash involving mucus membranes or skin necrosis: No Has patient had a PCN reaction that required hospitalization No Has patient had a PCN reaction occurring within the last 10 years: No If all of the above answers are NO, then may proceed with Cephalosporin use.    Sulfa Antibiotics Hives   Tylenol  [Acetaminophen ] Other (See Comments)    Pt reports he has had liver issues and was told not to take tylenol      Chief Complaint  Patient presents with   Medical Management of Chronic Issues            Vascular dementia without behavioral disturbance:     BPH with obstruction urinary symptoms:    Aortic atherosclerosis    HPI:   He is a 83 y.o. long term resident of this facility being seen for the management of his chronic illnesses: Vascular dementia without behavioral disturbance:     BPH with obstruction urinary symptoms:    Aortic atherosclerosis. There are no reports of uncontrolled pain. He continues with some urinary frequency states that it is not getting worse. He denies coughing.    Past Medical History:  Diagnosis Date   Arthritis    Chronic rhinitis    Complication of anesthesia    patient usually hs to be intubated with anesthesia   COPD (chronic obstructive pulmonary disease) (HCC)    PFT 06-26-09 FEV1  1.75( 71%), FVC 3.65( 101%), FEV1% 48, TLC 5.53(104%), DLCO 55%, no BD   Dyspnea    Hyperlipidemia    Hypertension    Nasal septal deviation    Nocturia    On home O2     2L N/C   OSA (obstructive sleep apnea) 04/21/2011   Partial small bowel obstruction (HCC) 10/18/2021   Skin cancer     Past Surgical History:  Procedure Laterality Date   APPENDECTOMY     BACK SURGERY     CATARACT EXTRACTION W/PHACO  01/06/2013   Procedure: CATARACT EXTRACTION PHACO AND INTRAOCULAR LENS PLACEMENT (IOC);  Surgeon: Cherene Mania, MD;  Location: [facility];  Service: Ophthalmology;  Laterality: Right;  CDE: 22.17   CATARACT EXTRACTION W/PHACO Left 06/20/2022   Procedure: CATARACT EXTRACTION PHACO AND INTRAOCULAR LENS PLACEMENT (IOC);  Surgeon: Harrie Agent, MD;  Location: [facility];  Service: Ophthalmology;  Laterality: Left;  CDE 20.85   FEMORAL HERNIA REPAIR Right 11/19/2022   Procedure: HERNIA REPAIR FEMORAL WITH MESH;  Surgeon: Kallie Manuelita BROCKS, MD;  Location: [facility];  Service: General;  Laterality: Right;   INGUINAL HERNIA REPAIR Left 12/09/2021   Procedure: HERNIA REPAIR INGUINAL ADULT WITH MESH;  Surgeon: Kallie Manuelita BROCKS, MD;  Location: [facility];  Service: General;  Laterality: Left;   INGUINAL HERNIA REPAIR Right 03/19/2022   Procedure: HERNIA REPAIR INGUINAL ADULT WITH MESH;  Surgeon: Kallie Manuelita BROCKS, MD;  Location: [facility];  Service: General;  Laterality: Right;   ORIF PATELLA Left 01/22/2023   Procedure: OPEN REDUCTION INTERNAL FIXATION (ORIF)  PATELLA;  Surgeon: Onesimo Oneil LABOR, MD;  Location: [facility];  Service: Orthopedics;  Laterality: Left;    Social History   Socioeconomic History   Marital status: Divorced    Spouse name: Not on file   Number of children: 3   Years of education: Not on file   Highest education level: Not on file  Occupational History   Occupation: Armed forces technical officer: RETIRED  Tobacco Use   Smoking status: Former    Current packs/day: 0.00    Average packs/day: 1.5 packs/day for 54.0 years (81.0 ttl pk-yrs)    Types: Cigarettes    Start date: 12/02/1947    Quit date: 12/01/2001    Years since quitting: 22.7   Smokeless tobacco: Never   Vaping Use   Vaping status: Never Used  Substance and Sexual Activity   Alcohol use: No   Drug use: No   Sexual activity: Not on file  Other Topics Concern   Not on file  Social History Narrative   Lives with sister.    Social Drivers of Corporate investment banker Strain: Low Risk  (02/07/2022)   Overall Financial Resource Strain (CARDIA)    Difficulty of Paying Living Expenses: Not hard at all  Food Insecurity: Low Risk  (08/06/2023)   Received from [facility]   Hunger Vital Sign    Within the past 12 months, you worried that your food would run out before you got money to buy more: Never true    Within the past 12 months, the food you bought just didn't last and you didn't have money to get more. : Never true  Transportation Needs: No Transportation Needs (08/06/2023)   Received from [employer]    In the past 12 months, has lack of reliable transportation kept you from medical appointments, meetings, work or from getting things needed for daily living? : No  Physical Activity: Insufficiently Active (02/07/2022)   Exercise Vital Sign    Days of Exercise per Week: 3 days    Minutes of Exercise per Session: 20 min  Stress: No Stress Concern Present (02/07/2022)   [employer] of Occupational Health - Occupational Stress Questionnaire    Feeling of Stress : Not at all  Social Connections: Socially Isolated (02/07/2022)   Social Connection and Isolation Panel    Frequency of Communication with Friends and Family: More than three times a week    Frequency of Social Gatherings with Friends and Family: More than three times a week    Attends Religious Services: Never    Database administrator or Organizations: No    Attends Banker Meetings: Never    Marital Status: Divorced  Catering manager Violence: Not At Risk (01/20/2023)   Humiliation, Afraid, Rape, and Kick questionnaire    Fear of Current or Ex-Partner: No    Emotionally Abused: No     Physically Abused: No    Sexually Abused: No   Family History  Problem Relation Age of Onset   Lung cancer Mother       VITAL SIGNS BP (!) 126/44   Pulse 61   Temp 97.8 F (36.6 C)   Resp 20   Ht 5' 6.5 (1.689 m)   Wt 140 lb 12.8 oz (63.9 kg)   SpO2 96%   BMI 22.39 kg/m   Outpatient Encounter Medications as of 09/06/2024  Medication Sig   Albuterol  Sulfate 2.5 MG/0.5ML NEBU Inhale 1 each into the  lungs every 6 (six) hours as needed.   Budeson-Glycopyrrol-Formoterol  (BREZTRI  AEROSPHERE) 160-9-4.8 MCG/ACT AERO Inhale 2 puffs into the lungs in the morning and at bedtime.   cholecalciferol  (VITAMIN D ) 25 MCG (1000 UNIT) tablet Take 1,000 Units by mouth daily.   feeding supplement (ENSURE ENLIVE / ENSURE PLUS) LIQD Take 237 mLs by mouth 3 (three) times daily between meals.   ferrous sulfate 325 (65 FE) MG EC tablet Take 325 mg by mouth every Monday, Wednesday, and Friday.   finasteride  (PROSCAR ) 5 MG tablet Take 1 tablet (5 mg total) by mouth daily.   fluticasone (FLONASE) 50 MCG/ACT nasal spray Place 2 sprays into both nostrils at bedtime.   levothyroxine (SYNTHROID) 50 MCG tablet Take 50 mcg by mouth daily before breakfast.   losartan  (COZAAR ) 25 MG tablet Take 12.5 mg by mouth daily.   Multiple Vitamins-Minerals (PRESERVISION AREDS 2) CAPS Take 1 capsule by mouth 2 (two) times daily.   OXYGEN  Inhale 2 L into the lungs as directed. Every Shift; Day, Evening, Night   solifenacin (VESICARE) 5 MG tablet Take 5 mg by mouth daily.   tamsulosin  (FLOMAX ) 0.4 MG CAPS capsule Take 2 capsules (0.8 mg total) by mouth daily.   UNABLE TO FIND DIET: DYS 2 MEATS / DYS 3 SIDES / Thin liquids GLUTEN FREE as of 06/10/23 07/01/23 Ground meats with soft vegetables as requested by resident. Does NOT want gravy.  No cranberry juice. Special Instructions: Only wants [place] sausage or bacon for breakfast - no pork   No facility-administered encounter medications on file as of 09/06/2024.     SIGNIFICANT  DIAGNOSTIC EXAMS  09-28-23: wbc 9.6; hgb 16.0; hgb 50.4 mcv 95.1 plt 251 glucose 141; bun 28; creat 0.83; k+ 3.9; na++ 137; ca 9.6 gfr >60; protein 7.1; albumin 3.7; hgb A1c 6.9 iron 93 tibc 436; chol 258; ldl 174; trig 163; hdl 51 psa 0.6 tsh 6.055; vitamin D  62.23; ACR 10.9 11-09-23: tsh 3.912 free t4: 1.17 11-27-23: glucose 131; bun 23; creat 0.70; k+ 3.8; na++ 134; ca 9.0 gfr >60  12-31-22: hgb A1c 7.1; chol 203; ldl 126; trig 187; hdl 40 5-85-74: wbc 8.0; hgb 15.1; hct 46.2; mcv 88.8 plt 227; glucose 148; bun 22; creat 0.79;k + 4.0; na++ 139; ca 9.7; gfr >60; protein 6.8 albumin 3.3 hgb A1c 7.2   NO NEW LABS.       Review of Systems  Constitutional:  Negative for malaise/fatigue.  Respiratory:  Negative for cough and shortness of breath.   Cardiovascular:  Negative for chest pain, palpitations and leg swelling.  Gastrointestinal:  Negative for abdominal pain, constipation and heartburn.  Genitourinary:  Positive for urgency.  Musculoskeletal:  Negative for back pain, joint pain and myalgias.  Skin: Negative.   Neurological:  Negative for dizziness.  Psychiatric/Behavioral:  The patient is not nervous/anxious.     Physical Exam Constitutional:      General: He is not in acute distress.    Appearance: He is well-developed. He is not diaphoretic.  Neck:     Thyroid : No thyromegaly.  Cardiovascular:     Rate and Rhythm: Normal rate and regular rhythm.     Heart sounds: Normal heart sounds.  Pulmonary:     Effort: Pulmonary effort is normal. No respiratory distress.     Breath sounds: Normal breath sounds.  Abdominal:     General: Bowel sounds are normal. There is no distension.     Palpations: Abdomen is soft.     Tenderness: There is  no abdominal tenderness.  Musculoskeletal:        General: Normal range of motion.     Cervical back: Neck supple.     Right lower leg: No edema.     Left lower leg: No edema.  Lymphadenopathy:     Cervical: No cervical adenopathy.  Skin:     General: Skin is warm and dry.  Neurological:     Mental Status: He is alert. Mental status is at baseline.     Comments: SLUMS: 15/30  Psychiatric:        Mood and Affect: Mood normal.     ASSESSMENT/ PLAN:  TODAY  Vascular dementia without behavioral disturbance: weight is 141 pounds; without significant change will monitor  2. BPH with obstruction urinary symptoms: will continue proscar  5 mg daily and flomax  0.8 mg daily; will continue vesicare 5 mg daily for urgency   3. Aortic atherosclerosis (ct 05-23-22) is not on statin due to advanced age.   PREVIOUS   4. Hypertension associated with type 2 diabetes mellitus: b/p 126/44: is on asa 81 mg daily   5. Pulmonary emphysema unspecified emphysema/chronic respiratory failure with hypoxia: has 81 pack year history: will continue breztri  160-9-4.8 mcg 2 puffs twice daily   6. Gastroesophageal reflux disease: is off PPI  7. BPH with obstruction urinary symptoms: will continue proscar  5 mg daily and flomax  0.8 mg daily   8. Protein calorie malnutrition, severe: protein 5.6 albumin 2.5 will continue supplements as directed.   9. Chronic constipation: is presently not on medications  10. Iron deficiency anemia: hgb 12.3iron 19 will continue iron three times weekly   11. Chronic type 2 diabetes mellitus with hyperglycemia steroid induced: hgb A1c 6.9; will monitor     Barnie Seip NP [facility]   call [phone]

## 2024-09-08 ENCOUNTER — Non-Acute Institutional Stay (SKILLED_NURSING_FACILITY): Payer: Self-pay | Admitting: Adult Health

## 2024-09-08 ENCOUNTER — Encounter: Payer: Self-pay | Admitting: Adult Health

## 2024-09-08 DIAGNOSIS — J439 Emphysema, unspecified: Secondary | ICD-10-CM | POA: Diagnosis not present

## 2024-09-08 NOTE — Progress Notes (Signed)
 Location:  [facility] Room Number: 6 D Place of Service:  SNF (31)   CODE STATUS: Full Code   Allergies  Allergen Reactions   Benzyl Alcohol Itching   Amlodipine  Swelling    Swelling in feet   Aspirin  Other (See Comments)    Upset stomach.   Other Hypertension    Hazelnuts   Penicillins Hives    Has patient had a PCN reaction causing immediate rash, facial/tongue/throat swelling, SOB or lightheadedness with hypotension: Yes Has patient had a PCN reaction causing severe rash involving mucus membranes or skin necrosis: No Has patient had a PCN reaction that required hospitalization No Has patient had a PCN reaction occurring within the last 10 years: No If all of the above answers are NO, then may proceed with Cephalosporin use.    Sulfa Antibiotics Hives   Tylenol  [Acetaminophen ] Other (See Comments)    Pt reports he has had liver issues and was told not to take tylenol      Chief Complaint  Patient presents with   Cough    HPI:  He is complaining of worsening shortness of breath with cough and tan sputum. There are no reports of fevers. He does have wheezing present.   Past Medical History:  Diagnosis Date   Arthritis    Chronic rhinitis    Complication of anesthesia    patient usually hs to be intubated with anesthesia   COPD (chronic obstructive pulmonary disease) (HCC)    PFT 06-26-09 FEV1  1.75( 71%), FVC 3.65( 101%), FEV1% 48, TLC 5.53(104%), DLCO 55%, no BD   Dyspnea    Hyperlipidemia    Hypertension    Nasal septal deviation    Nocturia    On home O2    2L N/C   OSA (obstructive sleep apnea) 04/21/2011   Partial small bowel obstruction (HCC) 10/18/2021   Skin cancer     Past Surgical History:  Procedure Laterality Date   APPENDECTOMY     BACK SURGERY     CATARACT EXTRACTION W/PHACO  01/06/2013   Procedure: CATARACT EXTRACTION PHACO AND INTRAOCULAR LENS PLACEMENT (IOC);  Surgeon: Cherene Mania, MD;  Location: [facility];  Service:  Ophthalmology;  Laterality: Right;  CDE: 22.17   CATARACT EXTRACTION W/PHACO Left 06/20/2022   Procedure: CATARACT EXTRACTION PHACO AND INTRAOCULAR LENS PLACEMENT (IOC);  Surgeon: Harrie Agent, MD;  Location: [facility];  Service: Ophthalmology;  Laterality: Left;  CDE 20.85   FEMORAL HERNIA REPAIR Right 11/19/2022   Procedure: HERNIA REPAIR FEMORAL WITH MESH;  Surgeon: Kallie Manuelita BROCKS, MD;  Location: [facility];  Service: General;  Laterality: Right;   INGUINAL HERNIA REPAIR Left 12/09/2021   Procedure: HERNIA REPAIR INGUINAL ADULT WITH MESH;  Surgeon: Kallie Manuelita BROCKS, MD;  Location: [facility];  Service: General;  Laterality: Left;   INGUINAL HERNIA REPAIR Right 03/19/2022   Procedure: HERNIA REPAIR INGUINAL ADULT WITH MESH;  Surgeon: Kallie Manuelita BROCKS, MD;  Location: [facility];  Service: General;  Laterality: Right;   ORIF PATELLA Left 01/22/2023   Procedure: OPEN REDUCTION INTERNAL FIXATION (ORIF) PATELLA;  Surgeon: Onesimo Oneil LABOR, MD;  Location: [facility];  Service: Orthopedics;  Laterality: Left;    Social History   Socioeconomic History   Marital status: Divorced    Spouse name: Not on file   Number of children: 3   Years of education: Not on file   Highest education level: Not on file  Occupational History   Occupation: Medical laboratory scientific officer  Employer: RETIRED  Tobacco Use   Smoking status: Former    Current packs/day: 0.00    Average packs/day: 1.5 packs/day for 54.0 years (81.0 ttl pk-yrs)    Types: Cigarettes    Start date: 12/02/1947    Quit date: 12/01/2001    Years since quitting: 22.8   Smokeless tobacco: Never  Vaping Use   Vaping status: Never Used  Substance and Sexual Activity   Alcohol use: No   Drug use: No   Sexual activity: Not on file  Other Topics Concern   Not on file  Social History Narrative   Lives with sister.    Social Drivers of Corporate investment banker Strain: Low Risk  (02/07/2022)   Overall Financial Resource Strain (CARDIA)    Difficulty of Paying Living  Expenses: Not hard at all  Food Insecurity: Low Risk  (08/06/2023)   Received from [facility]   Hunger Vital Sign    Within the past 12 months, you worried that your food would run out before you got money to buy more: Never true    Within the past 12 months, the food you bought just didn't last and you didn't have money to get more. : Never true  Transportation Needs: No Transportation Needs (08/06/2023)   Received from [facility]    In the past 12 months, has lack of reliable transportation kept you from medical appointments, meetings, work or from getting things needed for daily living? : No  Physical Activity: Insufficiently Active (02/07/2022)   Exercise Vital Sign    Days of Exercise per Week: 3 days    Minutes of Exercise per Session: 20 min  Stress: No Stress Concern Present (02/07/2022)   [employer] of Occupational Health - Occupational Stress Questionnaire    Feeling of Stress : Not at all  Social Connections: Socially Isolated (02/07/2022)   Social Connection and Isolation Panel    Frequency of Communication with Friends and Family: More than three times a week    Frequency of Social Gatherings with Friends and Family: More than three times a week    Attends Religious Services: Never    Database administrator or Organizations: No    Attends Banker Meetings: Never    Marital Status: Divorced  Catering manager Violence: Not At Risk (01/20/2023)   Humiliation, Afraid, Rape, and Kick questionnaire    Fear of Current or Ex-Partner: No    Emotionally Abused: No    Physically Abused: No    Sexually Abused: No   Family History  Problem Relation Age of Onset   Lung cancer Mother       VITAL SIGNS BP (!) 126/44   Pulse 61   Temp 97.8 F (36.6 C)   Resp 20   Ht 5' 6.5 (1.689 m)   Wt 140 lb 12.8 oz (63.9 kg)   SpO2 95%   BMI 22.39 kg/m   Outpatient Encounter Medications as of 09/08/2024  Medication Sig   Albuterol  Sulfate 2.5  MG/0.5ML NEBU Inhale 1 each into the lungs every 6 (six) hours as needed.   azithromycin  (ZITHROMAX ) 250 MG tablet Take by mouth daily. tablet; 250 mg; amt: 250mg ; oral Special Instructions: Administer 250mg  daily x 4 days (500mg  given day 1 09/08/24) Once A Day 09:00 AM   azithromycin  (ZITHROMAX ) 250 MG tablet Take 500 mg by mouth once. tablet; 250 mg; amt: 500mg ; oral Once - One Time 07:15 AM - 03:15 PM  Budeson-Glycopyrrol-Formoterol  (BREZTRI  AEROSPHERE) 160-9-4.8 MCG/ACT AERO Inhale 2 puffs into the lungs in the morning and at bedtime.   cholecalciferol  (VITAMIN D ) 25 MCG (1000 UNIT) tablet Take 1,000 Units by mouth daily.   ciclopirox (LOPROX) 0.77 % cream Apply topically. Special Instructions: APPLY CREAM TO TOENAILS DAILY FOR SIX MONTHS. REMOVE WITH ALCOHOL WIPES ONCE WEEKLY. PER PODIATRY Once A Day 07:15 AM - 03:15 PM   Continuous Glucose Sensor (FREESTYLE LIBRE 2 SENSOR) MISC by Does not apply route. Once A Day on Tue Every 2 Weeks 03:15 PM - 11:15 PM   feeding supplement (ENSURE ENLIVE / ENSURE PLUS) LIQD Take 237 mLs by mouth 3 (three) times daily between meals.   ferrous sulfate 325 (65 FE) MG EC tablet Take 325 mg by mouth every Monday, Wednesday, and Friday.   finasteride  (PROSCAR ) 5 MG tablet Take 1 tablet (5 mg total) by mouth daily.   fluticasone (FLONASE) 50 MCG/ACT nasal spray Place 2 sprays into both nostrils at bedtime.   levothyroxine (SYNTHROID) 50 MCG tablet Take 50 mcg by mouth daily before breakfast.   losartan  (COZAAR ) 25 MG tablet Take 12.5 mg by mouth daily.   Multiple Vitamins-Minerals (PRESERVISION AREDS 2) CAPS Take 1 capsule by mouth 2 (two) times daily.   OXYGEN  Inhale 2 L into the lungs as directed. Every Shift; Day, Evening, Night   predniSONE  (DELTASONE ) 20 MG tablet Take 40 mg by mouth daily.   solifenacin (VESICARE) 5 MG tablet Take 5 mg by mouth daily.   tamsulosin  (FLOMAX ) 0.4 MG CAPS capsule Take 2 capsules (0.8 mg total) by mouth daily.   UNABLE TO  FIND DIET: DYS 2 MEATS / DYS 3 SIDES / Thin liquids GLUTEN FREE as of 06/10/23 07/01/23 Ground meats with soft vegetables as requested by resident. Does NOT want gravy.  No cranberry juice. Special Instructions: Only wants [place] sausage or bacon for breakfast - no pork   No facility-administered encounter medications on file as of 09/08/2024.     SIGNIFICANT DIAGNOSTIC EXAMS  09-28-23: wbc 9.6; hgb 16.0; hgb 50.4 mcv 95.1 plt 251 glucose 141; bun 28; creat 0.83; k+ 3.9; na++ 137; ca 9.6 gfr >60; protein 7.1; albumin 3.7; hgb A1c 6.9 iron 93 tibc 436; chol 258; ldl 174; trig 163; hdl 51 psa 0.6 tsh 6.055; vitamin D  62.23; ACR 10.9 11-09-23: tsh 3.912 free t4: 1.17 11-27-23: glucose 131; bun 23; creat 0.70; k+ 3.8; na++ 134; ca 9.0 gfr >60  12-31-22: hgb A1c 7.1; chol 203; ldl 126; trig 187; hdl 40 5-85-74: wbc 8.0; hgb 15.1; hct 46.2; mcv 88.8 plt 227; glucose 148; bun 22; creat 0.79;k + 4.0; na++ 139; ca 9.7; gfr >60; protein 6.8 albumin 3.3 hgb A1c 7.2   NO NEW LABS  Review of Systems  Constitutional:  Negative for malaise/fatigue.  Respiratory:  Positive for cough, sputum production, shortness of breath and wheezing.   Cardiovascular:  Negative for chest pain, palpitations and leg swelling.  Gastrointestinal:  Negative for abdominal pain, constipation and heartburn.  Musculoskeletal:  Negative for back pain, joint pain and myalgias.  Skin: Negative.   Neurological:  Negative for dizziness.  Psychiatric/Behavioral:  The patient is not nervous/anxious.     Physical Exam Constitutional:      General: He is not in acute distress.    Appearance: He is well-developed. He is not diaphoretic.  Neck:     Thyroid : No thyromegaly.  Cardiovascular:     Rate and Rhythm: Normal rate and regular rhythm.  Heart sounds: Normal heart sounds.  Pulmonary:     Effort: No respiratory distress.     Breath sounds: Wheezing and rhonchi present.  Abdominal:     General: Bowel sounds are normal. There  is no distension.     Palpations: Abdomen is soft.     Tenderness: There is no abdominal tenderness.  Musculoskeletal:        General: Normal range of motion.     Cervical back: Neck supple.  Lymphadenopathy:     Cervical: No cervical adenopathy.  Skin:    General: Skin is warm and dry.  Neurological:     Mental Status: He is alert. Mental status is at baseline.     Comments: SLUMS: 15/30  Psychiatric:        Mood and Affect: Mood normal.       ASSESSMENT/ PLAN:  TODAY  Chronic obstructive pulmonary disease with emphysema unspecified emphysema type:  is worse; will begin a z-pack and prednisone  40 mg daily through 09-11-24    Barnie Seip NP [facility]   call [phone]

## 2024-09-15 ENCOUNTER — Other Ambulatory Visit (HOSPITAL_COMMUNITY)
Admission: RE | Admit: 2024-09-15 | Discharge: 2024-09-15 | Disposition: A | Discharge status: Home / Self Care | Source: Skilled Nursing Facility | Attending: Adult Health | Admitting: Adult Health

## 2024-09-15 DIAGNOSIS — J439 Emphysema, unspecified: Secondary | ICD-10-CM | POA: Diagnosis present

## 2024-09-15 DIAGNOSIS — E119 Type 2 diabetes mellitus without complications: Secondary | ICD-10-CM | POA: Diagnosis not present

## 2024-09-15 LAB — COMPREHENSIVE METABOLIC PANEL WITH GFR
ALT: 17 U/L (ref 0–44)
AST: 16 U/L (ref 15–41)
Albumin: 3.6 g/dL (ref 3.5–5.0)
Alkaline Phosphatase: 70 U/L (ref 38–126)
Anion gap: 10 (ref 5–15)
BUN: 20 mg/dL (ref 8–23)
CO2: 27 mmol/L (ref 22–32)
Calcium: 9.4 mg/dL (ref 8.9–10.3)
Chloride: 99 mmol/L (ref 98–111)
Creatinine, Ser: 0.91 mg/dL (ref 0.61–1.24)
GFR, Estimated: >60 mL/min (ref >60)
Glucose, Bld: 130 mg/dL — ABNORMAL HIGH (ref 70–99)
Potassium: 4.4 mmol/L (ref 3.5–5.1)
Sodium: 136 mmol/L (ref 135–145)
Total Bilirubin: 0.7 mg/dL (ref 0.0–1.2)
Total Protein: 6.4 g/dL — ABNORMAL LOW (ref 6.5–8.1)

## 2024-09-15 LAB — CBC
HCT: 44.9 % (ref 39.0–52.0)
Hemoglobin: 14.7 g/dL (ref 13.0–17.0)
MCH: 29.1 pg (ref 26.0–34.0)
MCHC: 32.7 g/dL (ref 30.0–36.0)
MCV: 88.9 fL (ref 80.0–100.0)
Platelets: 271 K/uL (ref 150–400)
RBC: 5.05 MIL/uL (ref 4.22–5.81)
RDW: 13.5 % (ref 11.5–15.5)
WBC: 10.5 K/uL (ref 4.0–10.5)
nRBC: 0 % (ref 0.0–0.2)

## 2024-09-15 LAB — HEMOGLOBIN A1C
Hgb A1c MFr Bld: 6.8 % — ABNORMAL HIGH (ref 4.8–5.6)
Mean Plasma Glucose: 148.46 mg/dL

## 2024-10-05 ENCOUNTER — Non-Acute Institutional Stay: Payer: Self-pay | Admitting: Internal Medicine

## 2024-10-05 ENCOUNTER — Encounter: Payer: Self-pay | Admitting: Internal Medicine

## 2024-10-05 DIAGNOSIS — E43 Unspecified severe protein-calorie malnutrition: Secondary | ICD-10-CM

## 2024-10-05 DIAGNOSIS — I152 Hypertension secondary to endocrine disorders: Secondary | ICD-10-CM

## 2024-10-05 DIAGNOSIS — L299 Pruritus, unspecified: Secondary | ICD-10-CM | POA: Insufficient documentation

## 2024-10-05 DIAGNOSIS — J9611 Chronic respiratory failure with hypoxia: Secondary | ICD-10-CM | POA: Diagnosis not present

## 2024-10-05 DIAGNOSIS — E1159 Type 2 diabetes mellitus with other circulatory complications: Secondary | ICD-10-CM

## 2024-10-05 NOTE — Assessment & Plan Note (Signed)
 O2 sats are excellent on current oxygen  settings and current pulmonary toilet regimen.  No change indicated, continue to monitor

## 2024-10-05 NOTE — Progress Notes (Unsigned)
 NURSING HOME LOCATION:  [facility] ROOM NUMBER:  [id number]  CODE STATUS:  Full Code  PCP: Landy Barnie RAMAN, NP   This is a nursing facility follow up visit of chronic medical diagnoses to document compliance with Regulation 483.30 (c) in The Long Term Care Survey Manual Phase 2 which mandates caregiver visit ( visits can alternate among physician, PA or NP as per statutes) within 10 days of 30 days / 60 days/ 90 days post admission to SNF date  .  Interim medical record and care since last SNF visit was updated with review of diagnostic studies and change in clinical status since last visit were documented.  HPI: He is a permanent resident of this facility with oxygen  dependent chronic respiratory insufficiency; hypothyroidism; BPH; essential hypertension; dyslipidemia; OSA; history of partial small bowel obstruction; and dyslipidemia. Significant is his past medical history of 81 pack years of smoking.  Labs are current as of 09/15/2024.  He has exhibited mild hyperglycemia with peak glucoses of 148.  A1c is 6.8%, down from a prior value of 7.2.  Albumin was low normal at 3.6 and total protein 6.4.  Review of systems: His main concern is itching of the trunk especially over the right flank area.  When asked for the duration he stated a long time.  When I pressed him on a specific time.  He stated for at least a year.  He states his breathing is terrible and cough is productive of 2-3 spoonfuls of opaque sputum daily.  Constitutional: No fever, significant weight change, fatigue  Eyes: No redness, discharge, pain, vision change ENT/mouth: No nasal congestion,  purulent discharge, earache, change in hearing, sore throat  Cardiovascular: No chest pain, palpitations, paroxysmal nocturnal dyspnea, claudication, edema  Respiratory: No cough, sputum production, hemoptysis, DOE, significant snoring, apnea   Gastrointestinal: No heartburn, dysphagia, abdominal pain, nausea  /vomiting, rectal bleeding, melena, change in bowels Genitourinary: No dysuria, hematuria, pyuria, incontinence, nocturia Musculoskeletal: No joint stiffness, joint swelling, weakness, pain Dermatologic: No rash, pruritus, change in appearance of skin Neurologic: No dizziness, headache, syncope, seizures, numbness, tingling Psychiatric: No significant anxiety, depression, insomnia, anorexia Endocrine: No change in hair/skin/nails, excessive thirst, excessive hunger, excessive urination  Hematologic/lymphatic: No significant bruising, lymphadenopathy, abnormal bleeding Allergy/immunology: No itchy/watery eyes, significant sneezing, urticaria, angioedema  Physical exam:  Pertinent or positive findings: He appears suboptimally nourished. He is profoundly hard of hearing. Nasal O2 in place.He is edentulous.He exhibits an intermittent randomly, nonproductive cough.  Lungs are silent & heart sounds distant in context of advanced emphysema.Pedal pulses are not palpable. Limb atrophy present.  There is no rash visible over the right side or flank areas.  General appearance: no acute distress, increased work of breathing is present.   Lymphatic: No lymphadenopathy about the head, neck, axilla. Eyes: No conjunctival inflammation or lid edema is present. There is no scleral icterus. Ears:  External ear exam shows no significant lesions or deformities.   Nose:  External nasal examination shows no deformity or inflammation. Nasal mucosa are pink and moist without lesions, exudates Oral exam:  Lips and gums are healthy appearing. There is no oropharyngeal erythema or exudate. Neck:  No thyromegaly, masses, tenderness noted.    Heart:  Normal rate and regular rhythm. S1 and S2 normal without gallop, murmur, click, rub .  Lungs: Chest clear to auscultation without wheezes, rhonchi, rales, rubs. Abdomen: Bowel sounds are normal. Abdomen is soft and nontender with no organomegaly, hernias, masses. GU: Deferred  Extremities:  No cyanosis, clubbing, edema  Neurologic exam :Strength equal  in upper & lower extremities Balance, Rhomberg, finger to nose testing could not be completed due to clinical state Deep tendon reflexes are equal Skin: Warm & dry w/o tenting. No significant lesions or rash.  See summary under each active problem in the Problem List with associated updated therapeutic plan

## 2024-10-05 NOTE — Assessment & Plan Note (Addendum)
 Skin is clear without evidence of any dermatitis.  Consider trial of montelukast if symptoms do not resolve off low-dose ARB.

## 2024-10-05 NOTE — Assessment & Plan Note (Addendum)
 Blood pressure is actually soft on low dose ARB.  Although he does not exhibit angioedema; he describes severe pruritus.  Rather than starting a drug such as montelukast; will see if symptoms resolve off the low-dose ARB. Glucose control is excellent as documented by current A1c of 6.8%, down from prior value of 7.2.

## 2024-10-05 NOTE — Assessment & Plan Note (Signed)
 Albumin is low normal at 3.6 and total protein 6.4.

## 2024-10-05 NOTE — Patient Instructions (Signed)
 See assessment and plan under each diagnosis in the problem list and acutely for this visit

## 2024-10-07 ENCOUNTER — Encounter: Payer: Self-pay | Admitting: Adult Health

## 2024-10-07 ENCOUNTER — Non-Acute Institutional Stay (SKILLED_NURSING_FACILITY): Payer: Self-pay | Admitting: Adult Health

## 2024-10-07 DIAGNOSIS — E1159 Type 2 diabetes mellitus with other circulatory complications: Secondary | ICD-10-CM

## 2024-10-07 DIAGNOSIS — I700 Atherosclerosis of aorta: Secondary | ICD-10-CM | POA: Diagnosis not present

## 2024-10-07 DIAGNOSIS — I152 Hypertension secondary to endocrine disorders: Secondary | ICD-10-CM | POA: Diagnosis not present

## 2024-10-07 DIAGNOSIS — J439 Emphysema, unspecified: Secondary | ICD-10-CM | POA: Diagnosis not present

## 2024-10-07 NOTE — Progress Notes (Signed)
 Location:  [facility] Room Number: 42 D Place of Service:  SNF (31)   CODE STATUS: Full Code   Allergies  Allergen Reactions   Benzyl Alcohol Itching   Amlodipine  Swelling    Swelling in feet   Aspirin  Other (See Comments)    Upset stomach.   Other Hypertension    Hazelnuts   Penicillins Hives    Has patient had a PCN reaction causing immediate rash, facial/tongue/throat swelling, SOB or lightheadedness with hypotension: Yes Has patient had a PCN reaction causing severe rash involving mucus membranes or skin necrosis: No Has patient had a PCN reaction that required hospitalization No Has patient had a PCN reaction occurring within the last 10 years: No If all of the above answers are NO, then may proceed with Cephalosporin use.    Sulfa Antibiotics Hives   Tylenol  [Acetaminophen ] Other (See Comments)    Pt reports he has had liver issues and was told not to take tylenol      Chief Complaint  Patient presents with   Care Plan Meeting    HPI:  We have come together for his care plan meeting.  BIMS 10/15 mood 6/30 little energy, poor appetite. He is ambulatory without falls. He is independent with his adl care. He is continent of bladder and bowel. Dietary: feeds self D2 meats D3 vegetables gluten free, weight 139 pounds supplements three times daily appetite 1-100%. Therapy: none at this time. Activities: bingo and outdoors. He will continue to be followed for his chronic illnesses including: Hypertension associated with type 2 diabetes mellitus   Aortic atherosclerosis   Chronic obstructive pulmonary disease with emphysema unspecified emphysema type   Past Medical History:  Diagnosis Date   Arthritis    Chronic rhinitis    Complication of anesthesia    patient usually hs to be intubated with anesthesia   COPD (chronic obstructive pulmonary disease) (HCC)    PFT 06-26-09 FEV1  1.75( 71%), FVC 3.65( 101%), FEV1% 48, TLC 5.53(104%), DLCO 55%, no BD    Dyspnea    Hyperlipidemia    Hypertension    Nasal septal deviation    Nocturia    On home O2    2L N/C   OSA (obstructive sleep apnea) 04/21/2011   Partial small bowel obstruction (HCC) 10/18/2021   Skin cancer     Past Surgical History:  Procedure Laterality Date   APPENDECTOMY     BACK SURGERY     CATARACT EXTRACTION W/PHACO  01/06/2013   Procedure: CATARACT EXTRACTION PHACO AND INTRAOCULAR LENS PLACEMENT (IOC);  Surgeon: Cherene Mania, MD;  Location: [facility];  Service: Ophthalmology;  Laterality: Right;  CDE: 22.17   CATARACT EXTRACTION W/PHACO Left 06/20/2022   Procedure: CATARACT EXTRACTION PHACO AND INTRAOCULAR LENS PLACEMENT (IOC);  Surgeon: Harrie Agent, MD;  Location: [facility];  Service: Ophthalmology;  Laterality: Left;  CDE 20.85   FEMORAL HERNIA REPAIR Right 11/19/2022   Procedure: HERNIA REPAIR FEMORAL WITH MESH;  Surgeon: Kallie Manuelita BROCKS, MD;  Location: [facility];  Service: General;  Laterality: Right;   INGUINAL HERNIA REPAIR Left 12/09/2021   Procedure: HERNIA REPAIR INGUINAL ADULT WITH MESH;  Surgeon: Kallie Manuelita BROCKS, MD;  Location: [facility];  Service: General;  Laterality: Left;   INGUINAL HERNIA REPAIR Right 03/19/2022   Procedure: HERNIA REPAIR INGUINAL ADULT WITH MESH;  Surgeon: Kallie Manuelita BROCKS, MD;  Location: [facility];  Service: General;  Laterality: Right;   ORIF PATELLA Left 01/22/2023   Procedure: OPEN REDUCTION  INTERNAL FIXATION (ORIF) PATELLA;  Surgeon: Onesimo Oneil LABOR, MD;  Location: [facility];  Service: Orthopedics;  Laterality: Left;    Social History   Socioeconomic History   Marital status: Divorced    Spouse name: Not on file   Number of children: 3   Years of education: Not on file   Highest education level: Not on file  Occupational History   Occupation: Armed Forces Technical Officer: RETIRED  Tobacco Use   Smoking status: Former    Current packs/day: 0.00    Average packs/day: 1.5 packs/day for 54.0 years (81.0 ttl pk-yrs)    Types: Cigarettes    Start  date: 12/02/1947    Quit date: 12/01/2001    Years since quitting: 22.8   Smokeless tobacco: Never  Vaping Use   Vaping status: Never Used  Substance and Sexual Activity   Alcohol use: No   Drug use: No   Sexual activity: Not on file  Other Topics Concern   Not on file  Social History Narrative   Lives with sister.    Social Drivers of Corporate Investment Banker Strain: Low Risk  (02/07/2022)   Overall Financial Resource Strain (CARDIA)    Difficulty of Paying Living Expenses: Not hard at all  Food Insecurity: Low Risk  (08/06/2023)   Received from [facility]   Hunger Vital Sign    Within the past 12 months, you worried that your food would run out before you got money to buy more: Never true    Within the past 12 months, the food you bought just didn't last and you didn't have money to get more. : Never true  Transportation Needs: No Transportation Needs (08/06/2023)   Received from [employer]    In the past 12 months, has lack of reliable transportation kept you from medical appointments, meetings, work or from getting things needed for daily living? : No  Physical Activity: Insufficiently Active (02/07/2022)   Exercise Vital Sign    Days of Exercise per Week: 3 days    Minutes of Exercise per Session: 20 min  Stress: No Stress Concern Present (02/07/2022)   [employer] of Occupational Health - Occupational Stress Questionnaire    Feeling of Stress : Not at all  Social Connections: Socially Isolated (02/07/2022)   Social Connection and Isolation Panel    Frequency of Communication with Friends and Family: More than three times a week    Frequency of Social Gatherings with Friends and Family: More than three times a week    Attends Religious Services: Never    Database Administrator or Organizations: No    Attends Banker Meetings: Never    Marital Status: Divorced  Catering Manager Violence: Not At Risk (01/20/2023)   Humiliation, Afraid,  Rape, and Kick questionnaire    Fear of Current or Ex-Partner: No    Emotionally Abused: No    Physically Abused: No    Sexually Abused: No   Family History  Problem Relation Age of Onset   Lung cancer Mother       VITAL SIGNS BP (!) 95/41   Pulse 80   Temp (!) 97.4 F (36.3 C)   Resp 20   Ht 5' 6.5 (1.689 m)   Wt 139 lb 12.8 oz (63.4 kg)   SpO2 94%   BMI 22.23 kg/m   Outpatient Encounter Medications as of 10/07/2024  Medication Sig   Albuterol  Sulfate 2.5 MG/0.5ML NEBU Inhale  1 each into the lungs every 6 (six) hours as needed.   Budeson-Glycopyrrol-Formoterol  (BREZTRI  AEROSPHERE) 160-9-4.8 MCG/ACT AERO Inhale 2 puffs into the lungs in the morning and at bedtime.   cholecalciferol  (VITAMIN D ) 25 MCG (1000 UNIT) tablet Take 1,000 Units by mouth daily.   ciclopirox (LOPROX) 0.77 % cream Apply topically. Special Instructions: APPLY CREAM TO TOENAILS DAILY FOR SIX MONTHS. REMOVE WITH ALCOHOL WIPES ONCE WEEKLY. PER PODIATRY Once A Day 07:15 AM - 03:15 PM   Continuous Glucose Sensor (FREESTYLE LIBRE 2 SENSOR) MISC by Does not apply route. Once A Day on Tue Every 2 Weeks 03:15 PM - 11:15 PM   feeding supplement (ENSURE ENLIVE / ENSURE PLUS) LIQD Take 237 mLs by mouth 3 (three) times daily between meals.   ferrous sulfate 325 (65 FE) MG EC tablet Take 325 mg by mouth every Monday, Wednesday, and Friday.   finasteride  (PROSCAR ) 5 MG tablet Take 1 tablet (5 mg total) by mouth daily.   fluticasone (FLONASE) 50 MCG/ACT nasal spray Place 2 sprays into both nostrils at bedtime.   levothyroxine (SYNTHROID) 50 MCG tablet Take 50 mcg by mouth daily before breakfast.   Multiple Vitamins-Minerals (PRESERVISION AREDS 2) CAPS Take 1 capsule by mouth 2 (two) times daily.   OXYGEN  Inhale 2 L into the lungs as directed. Every Shift; Day, Evening, Night   solifenacin (VESICARE) 5 MG tablet Take 5 mg by mouth daily.   tamsulosin  (FLOMAX ) 0.4 MG CAPS capsule Take 2 capsules (0.8 mg total) by mouth  daily.   UNABLE TO FIND DIET: DYS 2 MEATS / DYS 3 SIDES / Thin liquids GLUTEN FREE as of 06/10/23 07/01/23 Ground meats with soft vegetables as requested by resident. Does NOT want gravy.  No cranberry juice. Special Instructions: Only wants Turkey sausage or bacon for breakfast - no pork   azithromycin  (ZITHROMAX ) 250 MG tablet Take by mouth daily. tablet; 250 mg; amt: 250mg ; oral Special Instructions: Administer 250mg  daily x 4 days (500mg  given day 1 09/08/24) Once A Day 09:00 AM (Patient not taking: Reported on 10/07/2024)   azithromycin  (ZITHROMAX ) 250 MG tablet Take 500 mg by mouth once. tablet; 250 mg; amt: 500mg ; oral Once - One Time 07:15 AM - 03:15 PM (Patient not taking: Reported on 10/07/2024)   losartan  (COZAAR ) 25 MG tablet Take 12.5 mg by mouth daily. (Patient not taking: Reported on 10/07/2024)   predniSONE  (DELTASONE ) 20 MG tablet Take 40 mg by mouth daily. (Patient not taking: Reported on 10/07/2024)   No facility-administered encounter medications on file as of 10/07/2024.     SIGNIFICANT DIAGNOSTIC EXAMS  11-09-23: tsh 3.912 free t4: 1.17 11-27-23: glucose 131; bun 23; creat 0.70; k+ 3.8; na++ 134; ca 9.0 gfr >60  12-31-22: hgb A1c 7.1; chol 203; ldl 126; trig 187; hdl 40 5-85-74: wbc 8.0; hgb 15.1; hct 46.2; mcv 88.8 plt 227; glucose 148; bun 22; creat 0.79;k + 4.0; na++ 139; ca 9.7; gfr >60; protein 6.8 albumin 3.3 hgb A1c 7.2   NO NEW LABS  Review of Systems  Constitutional:  Negative for malaise/fatigue.  Respiratory:  Negative for cough and shortness of breath.   Cardiovascular:  Negative for chest pain, palpitations and leg swelling.  Gastrointestinal:  Negative for abdominal pain, constipation and heartburn.  Musculoskeletal:  Negative for back pain, joint pain and myalgias.  Skin: Negative.   Neurological:  Negative for dizziness.  Psychiatric/Behavioral:  The patient is not nervous/anxious.     Physical Exam Constitutional:  General: He is not in acute  distress.    Appearance: He is well-developed. He is not diaphoretic.  Neck:     Thyroid : No thyromegaly.  Cardiovascular:     Rate and Rhythm: Normal rate and regular rhythm.     Heart sounds: Normal heart sounds.  Pulmonary:     Effort: Pulmonary effort is normal. No respiratory distress.     Breath sounds: Normal breath sounds.  Abdominal:     General: Bowel sounds are normal. There is no distension.     Palpations: Abdomen is soft.     Tenderness: There is no abdominal tenderness.  Musculoskeletal:        General: Normal range of motion.     Cervical back: Neck supple.     Right lower leg: No edema.     Left lower leg: No edema.  Lymphadenopathy:     Cervical: No cervical adenopathy.  Skin:    General: Skin is warm and dry.  Neurological:     Mental Status: He is alert. Mental status is at baseline.     Comments: SLUMS: 15/30  Psychiatric:        Mood and Affect: Mood normal.       ASSESSMENT/ PLAN:  TODAY  Hypertension associated with type 2 diabetes mellitus Aortic atherosclerosis Chronic obstructive pulmonary disease with emphysema unspecified emphysema type  Will continue current medications Will continue current plan of care Will continue to monitor his status.   Time spent with patient: 40 minutes: medications; dietary; plan of care    Barnie Seip NP [facility]   call [phone]

## 2024-11-08 ENCOUNTER — Non-Acute Institutional Stay (SKILLED_NURSING_FACILITY): Payer: Self-pay | Admitting: Adult Health

## 2024-11-08 ENCOUNTER — Encounter: Payer: Self-pay | Admitting: Adult Health

## 2024-11-08 NOTE — Progress Notes (Unsigned)
 Location:  [facility] Room Number: 110 Place of Service:  SNF (31)   CODE STATUS: ***  Allergies  Allergen Reactions   Benzyl Alcohol Itching   Amlodipine  Swelling    Swelling in feet   Aspirin  Other (See Comments)    Upset stomach.   Other Hypertension    Hazelnuts   Penicillins Hives    Has patient had a PCN reaction causing immediate rash, facial/tongue/throat swelling, SOB or lightheadedness with hypotension: Yes Has patient had a PCN reaction causing severe rash involving mucus membranes or skin necrosis: No Has patient had a PCN reaction that required hospitalization No Has patient had a PCN reaction occurring within the last 10 years: No If all of the above answers are NO, then may proceed with Cephalosporin use.    Sulfa Antibiotics Hives   Tylenol  [Acetaminophen ] Other (See Comments)    Pt reports he has had liver issues and was told not to take tylenol      Chief Complaint  Patient presents with   Medical Management of Chronic Issues         ***    HPI:    Past Medical History:  Diagnosis Date   Arthritis    Chronic rhinitis    Complication of anesthesia    patient usually hs to be intubated with anesthesia   COPD (chronic obstructive pulmonary disease) (HCC)    PFT 06-26-09 FEV1  1.75( 71%), FVC 3.65( 101%), FEV1% 48, TLC 5.53(104%), DLCO 55%, no BD   Dyspnea    Hyperlipidemia    Hypertension    Nasal septal deviation    Nocturia    On home O2    2L N/C   OSA (obstructive sleep apnea) 04/21/2011   Partial small bowel obstruction (HCC) 10/18/2021   Skin cancer     Past Surgical History:  Procedure Laterality Date   APPENDECTOMY     BACK SURGERY     CATARACT EXTRACTION W/PHACO  01/06/2013   Procedure: CATARACT EXTRACTION PHACO AND INTRAOCULAR LENS PLACEMENT (IOC);  Surgeon: Cherene Mania, MD;  Location: [facility];  Service: Ophthalmology;  Laterality: Right;  CDE: 22.17   CATARACT EXTRACTION W/PHACO Left 06/20/2022   Procedure:  CATARACT EXTRACTION PHACO AND INTRAOCULAR LENS PLACEMENT (IOC);  Surgeon: Harrie Agent, MD;  Location: [facility];  Service: Ophthalmology;  Laterality: Left;  CDE 20.85   FEMORAL HERNIA REPAIR Right 11/19/2022   Procedure: HERNIA REPAIR FEMORAL WITH MESH;  Surgeon: Kallie Manuelita BROCKS, MD;  Location: [facility];  Service: General;  Laterality: Right;   INGUINAL HERNIA REPAIR Left 12/09/2021   Procedure: HERNIA REPAIR INGUINAL ADULT WITH MESH;  Surgeon: Kallie Manuelita BROCKS, MD;  Location: [facility];  Service: General;  Laterality: Left;   INGUINAL HERNIA REPAIR Right 03/19/2022   Procedure: HERNIA REPAIR INGUINAL ADULT WITH MESH;  Surgeon: Kallie Manuelita BROCKS, MD;  Location: [facility];  Service: General;  Laterality: Right;   ORIF PATELLA Left 01/22/2023   Procedure: OPEN REDUCTION INTERNAL FIXATION (ORIF) PATELLA;  Surgeon: Onesimo Oneil LABOR, MD;  Location: [facility];  Service: Orthopedics;  Laterality: Left;    Social History   Socioeconomic History   Marital status: Divorced    Spouse name: Not on file   Number of children: 3   Years of education: Not on file   Highest education level: Not on file  Occupational History   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    Social Drivers of Corporate Investment Banker Strain: Low Risk  (02/07/2022)   Overall Financial Resource Strain (CARDIA)    Difficulty of Paying Living Expenses: Not hard at all  Food Insecurity: Low Risk (08/06/2023)   Received from [facility]   Hunger  Vital Sign    Within the past 12 months, you worried that your food would run out before you got money to buy more: Never true    Within the past 12 months, the food you bought just didn't last and you didn't have money to get more. : Never true  Transportation Needs: No Transportation Needs (08/06/2023)   Received from [employer]    In the past 12 months, has lack of reliable transportation kept you from medical appointments, meetings, work or from getting things needed for daily living? : No  Physical Activity: Insufficiently Active (02/07/2022)   Exercise Vital Sign    Days of Exercise per Week: 3 days    Minutes of Exercise per Session: 20 min  Stress: No Stress Concern Present (02/07/2022)   [employer] of Occupational Health - Occupational Stress Questionnaire    Feeling of Stress : Not at all  Social Connections: Socially Isolated (02/07/2022)   Social Connection and Isolation Panel    Frequency of Communication with Friends and Family: More than three times a week    Frequency of Social Gatherings with Friends and Family: More than three times a week    Attends Religious Services: Never    Database Administrator or Organizations: No    Attends Banker Meetings: Never    Marital Status: Divorced  Catering Manager Violence: Not At Risk (01/20/2023)   Humiliation, Afraid, Rape, and Kick questionnaire    Fear of Current or Ex-Partner: No    Emotionally Abused: No    Physically Abused: No    Sexually Abused: No   Family History  Problem Relation Age of Onset   Lung cancer Mother       VITAL SIGNS BP 112/61   Pulse 60   Temp (!) 97.5 F (36.4 C)   Resp 18   Ht 5' 6.5 (1.689 m)   Wt 140 lb 12.8 oz (63.9 kg)   SpO2 95%   BMI 22.39 kg/m   Outpatient Encounter Medications as of 11/08/2024  Medication Sig   Albuterol  Sulfate 2.5 MG/0.5ML NEBU Inhale 1 each into the lungs every 6 (six) hours as needed.   Budeson-Glycopyrrol-Formoterol   (BREZTRI  AEROSPHERE) 160-9-4.8 MCG/ACT AERO Inhale 2 puffs into the lungs in the morning and at bedtime.   cholecalciferol  (VITAMIN D ) 25 MCG (1000 UNIT) tablet Take 1,000 Units by mouth daily.   ciclopirox (LOPROX) 0.77 % cream Apply topically. Special Instructions: APPLY CREAM TO TOENAILS DAILY FOR SIX MONTHS. REMOVE WITH ALCOHOL WIPES ONCE WEEKLY. PER PODIATRY Once A Day 07:15 AM - 03:15 PM   Continuous Glucose Sensor (FREESTYLE LIBRE 2 SENSOR)  MISC by Does not apply route. Once A Day on Tue Every 2 Weeks 03:15 PM - 11:15 PM   feeding supplement (ENSURE ENLIVE / ENSURE PLUS) LIQD Take 237 mLs by mouth 3 (three) times daily between meals.   ferrous sulfate 325 (65 FE) MG EC tablet Take 325 mg by mouth every Monday, Wednesday, and Friday.   finasteride  (PROSCAR ) 5 MG tablet Take 1 tablet (5 mg total) by mouth daily.   fluticasone (FLONASE) 50 MCG/ACT nasal spray Place 2 sprays into both nostrils at bedtime.   levothyroxine (SYNTHROID) 50 MCG tablet Take 50 mcg by mouth daily before breakfast.   Multiple Vitamins-Minerals (PRESERVISION AREDS 2) CAPS Take 1 capsule by mouth 2 (two) times daily.   OXYGEN  Inhale 2 L into the lungs as directed. Every Shift; Day, Evening, Night   solifenacin (VESICARE) 5 MG tablet Take 5 mg by mouth daily.   tamsulosin  (FLOMAX ) 0.4 MG CAPS capsule Take 2 capsules (0.8 mg total) by mouth daily.   UNABLE TO FIND DIET: DYS 2 MEATS / DYS 3 SIDES / Thin liquids GLUTEN FREE as of 06/10/23 07/01/23 Ground meats with soft vegetables as requested by resident. Does NOT want gravy.  No cranberry juice. Special Instructions: Only wants Turkey sausage or bacon for breakfast - no pork   No facility-administered encounter medications on file as of 11/08/2024.     SIGNIFICANT DIAGNOSTIC EXAMS       ASSESSMENT/ PLAN:     Barnie Seip NP [facility]  call [phone]

## 2024-11-25 ENCOUNTER — Other Ambulatory Visit (HOSPITAL_COMMUNITY)
Admission: RE | Admit: 2024-11-25 | Discharge: 2024-11-25 | Disposition: A | Discharge status: Home / Self Care | Source: Skilled Nursing Facility | Attending: Internal Medicine | Admitting: Internal Medicine

## 2024-11-25 DIAGNOSIS — J1000 Influenza due to other identified influenza virus with unspecified type of pneumonia: Secondary | ICD-10-CM | POA: Insufficient documentation

## 2024-11-25 LAB — RESP PANEL BY RT-PCR (RSV, FLU A&B, COVID)  RVPGX2
Influenza A by PCR: NEGATIVE
Influenza B by PCR: NEGATIVE
Resp Syncytial Virus by PCR: NEGATIVE
SARS Coronavirus 2 by RT PCR: NEGATIVE

## 2024-11-26 ENCOUNTER — Other Ambulatory Visit (HOSPITAL_COMMUNITY)
Admission: RE | Admit: 2024-11-26 | Discharge: 2024-11-26 | Disposition: A | Source: Skilled Nursing Facility | Attending: Adult Health | Admitting: Adult Health

## 2024-11-26 DIAGNOSIS — I10 Essential (primary) hypertension: Secondary | ICD-10-CM | POA: Insufficient documentation

## 2024-11-26 LAB — BASIC METABOLIC PANEL WITH GFR
Anion gap: 7 (ref 5–15)
BUN: 25 mg/dL — ABNORMAL HIGH (ref 8–23)
CO2: 29 mmol/L (ref 22–32)
Calcium: 9.3 mg/dL (ref 8.9–10.3)
Chloride: 104 mmol/L (ref 98–111)
Creatinine, Ser: 0.8 mg/dL (ref 0.61–1.24)
GFR, Estimated: >60 mL/min
Glucose, Bld: 75 mg/dL (ref 70–99)
Potassium: 3.9 mmol/L (ref 3.5–5.1)
Sodium: 139 mmol/L (ref 135–145)

## 2024-12-08 ENCOUNTER — Non-Acute Institutional Stay (SKILLED_NURSING_FACILITY): Admitting: Adult Health

## 2024-12-08 ENCOUNTER — Encounter: Payer: Self-pay | Admitting: Adult Health

## 2024-12-08 DIAGNOSIS — E43 Unspecified severe protein-calorie malnutrition: Secondary | ICD-10-CM

## 2024-12-08 DIAGNOSIS — D509 Iron deficiency anemia, unspecified: Secondary | ICD-10-CM | POA: Diagnosis not present

## 2024-12-08 DIAGNOSIS — K5909 Other constipation: Secondary | ICD-10-CM | POA: Diagnosis not present

## 2024-12-08 NOTE — Progress Notes (Unsigned)
 " Location:  [facility] Room Number: 109 Place of Service:  SNF (31)   CODE STATUS: full  Allergies[1]  Chief Complaint  Patient presents with   Medical Management of Chronic Issues           Protein calorie malnutrition severe: Chronic constipation: Iron deficiency anemia    HPI:  He is a 84 y.o. long term resident of this facility being seen for the management of his chronic illnesses:Protein calorie malnutrition severe: Chronic constipation: Iron deficiency anemia. There are no reports of uncontrolled pain. His diabetes is well managed with hgb A1c of 6.8.  he has lost a few pounds to 137. There are no reports of shortness of breath or coughing.    Past Medical History:  Diagnosis Date   Arthritis    Chronic rhinitis    Complication of anesthesia    patient usually hs to be intubated with anesthesia   COPD (chronic obstructive pulmonary disease) (HCC)    PFT 06-26-09 FEV1  1.75( 71%), FVC 3.65( 101%), FEV1% 48, TLC 5.53(104%), DLCO 55%, no BD   Dyspnea    Hyperlipidemia    Hypertension    Nasal septal deviation    Nocturia    On home O2    2L N/C   OSA (obstructive sleep apnea) 04/21/2011   Partial small bowel obstruction (HCC) 10/18/2021   Skin cancer     Past Surgical History:  Procedure Laterality Date   APPENDECTOMY     BACK SURGERY     CATARACT EXTRACTION W/PHACO  01/06/2013   Procedure: CATARACT EXTRACTION PHACO AND INTRAOCULAR LENS PLACEMENT (IOC);  Surgeon: Cherene Mania, MD;  Location: [facility];  Service: Ophthalmology;  Laterality: Right;  CDE: 22.17   CATARACT EXTRACTION W/PHACO Left 06/20/2022   Procedure: CATARACT EXTRACTION PHACO AND INTRAOCULAR LENS PLACEMENT (IOC);  Surgeon: Harrie Agent, MD;  Location: [facility];  Service: Ophthalmology;  Laterality: Left;  CDE 20.85   FEMORAL HERNIA REPAIR Right 11/19/2022   Procedure: HERNIA REPAIR FEMORAL WITH MESH;  Surgeon: Kallie Manuelita BROCKS, MD;  Location: [facility];  Service: General;  Laterality:  Right;   INGUINAL HERNIA REPAIR Left 12/09/2021   Procedure: HERNIA REPAIR INGUINAL ADULT WITH MESH;  Surgeon: Kallie Manuelita BROCKS, MD;  Location: [facility];  Service: General;  Laterality: Left;   INGUINAL HERNIA REPAIR Right 03/19/2022   Procedure: HERNIA REPAIR INGUINAL ADULT WITH MESH;  Surgeon: Kallie Manuelita BROCKS, MD;  Location: [facility];  Service: General;  Laterality: Right;   ORIF PATELLA Left 01/22/2023   Procedure: OPEN REDUCTION INTERNAL FIXATION (ORIF) PATELLA;  Surgeon: Onesimo Oneil LABOR, MD;  Location: [facility];  Service: Orthopedics;  Laterality: Left;    Social History   Socioeconomic History   Marital status: Divorced    Spouse name: Not on file   Number of children: 3   Years of education: Not on file   Highest education level: Not on file  Occupational History   Occupation: Armed Forces Technical Officer: RETIRED  Tobacco Use   Smoking status: Former    Current packs/day: 0.00    Average packs/day: 1.5 packs/day for 54.0 years (81.0 ttl pk-yrs)    Types: Cigarettes    Start date: 12/02/1947    Quit date: 12/01/2001    Years since quitting: 23.0   Smokeless tobacco: Never  Vaping Use   Vaping status: Never Used  Substance and Sexual Activity   Alcohol use: No   Drug use: No   Sexual  activity: Not on file  Other Topics Concern   Not on file  Social History Narrative   Lives with sister.    Social Drivers of Health   Tobacco Use: Medium Risk (12/08/2024)   Patient History    Smoking Tobacco Use: Former    Smokeless Tobacco Use: Never    Passive Exposure: Not on file  Financial Resource Strain: Low Risk (02/07/2022)   Overall Financial Resource Strain (CARDIA)    Difficulty of Paying Living Expenses: Not hard at all  Food Insecurity: Low Risk (08/06/2023)   Received from [facility]   Epic    Within the past 12 months, you worried that your food would run out before you got money to buy more: Never true    Within the past 12 months, the food you bought just didn't last and you  didn't have money to get more. : Never true  Transportation Needs: No Transportation Needs (08/06/2023)   Received from [employer]    In the past 12 months, has lack of reliable transportation kept you from medical appointments, meetings, work or from getting things needed for daily living? : No  Physical Activity: Insufficiently Active (02/07/2022)   Exercise Vital Sign    Days of Exercise per Week: 3 days    Minutes of Exercise per Session: 20 min  Stress: No Stress Concern Present (02/07/2022)   [employer] of Occupational Health - Occupational Stress Questionnaire    Feeling of Stress : Not at all  Social Connections: Socially Isolated (02/07/2022)   Social Connection and Isolation Panel    Frequency of Communication with Friends and Family: More than three times a week    Frequency of Social Gatherings with Friends and Family: More than three times a week    Attends Religious Services: Never    Database Administrator or Organizations: No    Attends Banker Meetings: Never    Marital Status: Divorced  Catering Manager Violence: Not At Risk (01/20/2023)   Humiliation, Afraid, Rape, and Kick questionnaire    Fear of Current or Ex-Partner: No    Emotionally Abused: No    Physically Abused: No    Sexually Abused: No  Depression (PHQ2-9): Low Risk (03/30/2024)   Depression (PHQ2-9)    PHQ-2 Score: 0  Alcohol Screen: Low Risk (02/07/2022)   Alcohol Screen    Last Alcohol Screening Score (AUDIT): 0  Housing: Low Risk (08/06/2023)   Received from [facility]   Epic    What is your living situation today?: I have a steady place to live    Think about the place you live. Do you have problems with any of the following? Choose all that apply:: None/None on this list  Utilities: Low Risk (08/06/2023)   Received from [facility]   Utilities    In the past 12 months has the electric, gas, oil, or water  company threatened to shut off services in your home? :  No  Health Literacy: Not on file   Family History  Problem Relation Age of Onset   Lung cancer Mother       VITAL SIGNS BP (!) 120/59   Pulse 68   Temp (!) 97.3 F (36.3 C)   Resp 19   Ht 5' 6.5 (1.689 m)   Wt 137 lb 9.6 oz (62.4 kg)   SpO2 95%   BMI 21.88 kg/m   Outpatient Encounter Medications as of 12/08/2024  Medication Sig   Albuterol   Sulfate 2.5 MG/0.5ML NEBU Inhale 1 each into the lungs every 6 (six) hours as needed.   Budeson-Glycopyrrol-Formoterol  (BREZTRI  AEROSPHERE) 160-9-4.8 MCG/ACT AERO Inhale 2 puffs into the lungs in the morning and at bedtime.   cholecalciferol  (VITAMIN D ) 25 MCG (1000 UNIT) tablet Take 1,000 Units by mouth daily.   ciclopirox (LOPROX) 0.77 % cream Apply topically. Special Instructions: APPLY CREAM TO TOENAILS DAILY FOR SIX MONTHS. REMOVE WITH ALCOHOL WIPES ONCE WEEKLY. PER PODIATRY Once A Day 07:15 AM - 03:15 PM   Continuous Glucose Sensor (FREESTYLE LIBRE 2 SENSOR) MISC by Does not apply route. Once A Day on Tue Every 2 Weeks 03:15 PM - 11:15 PM   feeding supplement (ENSURE ENLIVE / ENSURE PLUS) LIQD Take 237 mLs by mouth 3 (three) times daily between meals.   ferrous sulfate 325 (65 FE) MG EC tablet Take 325 mg by mouth every Monday, Wednesday, and Friday.   finasteride  (PROSCAR ) 5 MG tablet Take 1 tablet (5 mg total) by mouth daily.   fluticasone (FLONASE) 50 MCG/ACT nasal spray Place 2 sprays into both nostrils at bedtime.   levothyroxine (SYNTHROID) 50 MCG tablet Take 50 mcg by mouth daily before breakfast.   Multiple Vitamins-Minerals (PRESERVISION AREDS 2) CAPS Take 1 capsule by mouth 2 (two) times daily.   OXYGEN  Inhale 2 L into the lungs as directed. Every Shift; Day, Evening, Night   solifenacin (VESICARE) 5 MG tablet Take 5 mg by mouth daily.   tamsulosin  (FLOMAX ) 0.4 MG CAPS capsule Take 2 capsules (0.8 mg total) by mouth daily.   UNABLE TO FIND DIET: DYS 2 MEATS / DYS 3 SIDES / Thin liquids GLUTEN FREE as of 06/10/23 07/01/23 Ground  meats with soft vegetables as requested by resident. Does NOT want gravy.  No cranberry juice. Special Instructions: Only wants Turkey sausage or bacon for breakfast - no pork   No facility-administered encounter medications on file as of 12/08/2024.     SIGNIFICANT DIAGNOSTIC EXAMS  12-31-22: hgb A1c 7.1; chol 203; ldl 126; trig 187; hdl 40 5-85-74: wbc 8.0; hgb 15.1; hct 46.2; mcv 88.8 plt 227; glucose 148; bun 22; creat 0.79;k + 4.0; na++ 139; ca 9.7; gfr >60; protein 6.8 albumin 3.3 hgb A1c 7.2   TODAY  09-15-24: wbc 10.5; hgb 14.7; hct 44.9; mcv 88.9 plt 271; glucose 130; bun 20; creat 0.91; k+ 4.4; na++ 136; ca 9.4 gfr >60; hgb A1c 6.8 11-26-24: glucose 75; bun 25; creat 0.80; k+ 3.9; na++ 139; ca 9.3 gfr >60      Review of Systems  Constitutional:  Negative for malaise/fatigue.  Respiratory:  Negative for cough and shortness of breath.   Cardiovascular:  Negative for chest pain, palpitations and leg swelling.  Gastrointestinal:  Negative for abdominal pain, constipation and heartburn.  Musculoskeletal:  Negative for back pain, joint pain and myalgias.  Skin: Negative.   Neurological:  Negative for dizziness.  Psychiatric/Behavioral:  The patient is not nervous/anxious.      Physical Exam Constitutional:      General: He is not in acute distress.    Appearance: He is well-developed. He is not diaphoretic.  Neck:     Thyroid : No thyromegaly.  Cardiovascular:     Rate and Rhythm: Normal rate and regular rhythm.     Heart sounds: Normal heart sounds.  Pulmonary:     Effort: Pulmonary effort is normal. No respiratory distress.     Breath sounds: Normal breath sounds.  Abdominal:     General: Bowel sounds are  normal. There is no distension.     Palpations: Abdomen is soft.     Tenderness: There is no abdominal tenderness.  Musculoskeletal:        General: Normal range of motion.     Cervical back: Neck supple.     Right lower leg: No edema.     Left lower leg: No edema.   Lymphadenopathy:     Cervical: No cervical adenopathy.  Skin:    General: Skin is warm and dry.  Neurological:     Mental Status: He is alert. Mental status is at baseline.     Comments: SLUMS: 15/30   Psychiatric:        Mood and Affect: Mood normal.      ASSESSMENT/ PLAN:  TODAY  Protein calorie malnutrition severe: protein 6.4 albumin 3.6 will continue supplements as directed  2. Chronic constipation: is presently not on medications  3. Iron deficiency anemia: hgb 14.7; iron 19; will continue iron three times weekly    PREVIOUS   4. Chronic type 2 diabetes mellitus with hyperglycemia steroid induced: hgb A1c 6.8; will monitor   5. Vascular dementia without behavioral disturbance: weight is 137 pounds; without significant change will monitor  6. BPH with obstruction urinary symptoms: will continue proscar  5 mg daily and flomax  0.8 mg daily; will continue vesicare 5 mg daily for urgency   7. Aortic atherosclerosis (ct 05-23-22) is not on statin due to advanced age.   8. Hypertension associated with type 2 diabetes mellitus b/p: 120/59 is on asa 81 mg daily  9. Pulmonary emphysema unspecified emphysema/chronic respiratory failure with hypoxia: has 81 year pack year history; will continue breztri  160-9-4.8 mcg 2 puffs twice daily   10. Gastroesophageal reflux disease without esophagitis: is off PPI     Barnie Seip NP [facility]   call [phone]      [1]  Allergies Allergen Reactions   Benzyl Alcohol Itching   Amlodipine  Swelling    Swelling in feet   Aspirin  Other (See Comments)    Upset stomach.   Other Hypertension    Hazelnuts   Penicillins Hives    Has patient had a PCN reaction causing immediate rash, facial/tongue/throat swelling, SOB or lightheadedness with hypotension: Yes Has patient had a PCN reaction causing severe rash involving mucus membranes or skin necrosis: No Has patient had a PCN reaction that required hospitalization  No Has patient had a PCN reaction occurring within the last 10 years: No If all of the above answers are NO, then may proceed with Cephalosporin use.    Sulfa Antibiotics Hives   Tylenol  [Acetaminophen ] Other (See Comments)    Pt reports he has had liver issues and was told not to take tylenol     "

## 2024-12-30 ENCOUNTER — Non-Acute Institutional Stay (SKILLED_NURSING_FACILITY): Admitting: Adult Health

## 2024-12-30 DIAGNOSIS — J439 Emphysema, unspecified: Secondary | ICD-10-CM

## 2024-12-30 DIAGNOSIS — I700 Atherosclerosis of aorta: Secondary | ICD-10-CM | POA: Diagnosis not present

## 2024-12-30 DIAGNOSIS — E1165 Type 2 diabetes mellitus with hyperglycemia: Secondary | ICD-10-CM

## 2024-12-30 NOTE — Progress Notes (Unsigned)
 "  Location:  [facility] Room Number: 114 Place of Service:  SNF (31)   CODE STATUS: dnr   Allergies[1]  Chief Complaint  Patient presents with   Acute Visit    Care plan meeting     HPI:  We have come together for his care plan meeting. BIMS 13/15 mood 3/30: is nervous. He uses wheelchair without falls. He requires supervision for his adl care. He is continent of bladder and bowel. Dietary: D2 meats D3 sides; gluten free. Feeds self. Appetite 76-100%; weight is 138 pounds. Therapy: none at this time. Activities: bingo. He will continue to be followed for his chronic illnesses including: Aortic atherosclerosis  Chronic obstructive pulmonary disease with emphysema unspecified emphysema type Controlled type 2 diabetes mellitus with hyperglycemia.    Past Medical History:  Diagnosis Date   Arthritis    Chronic rhinitis    Complication of anesthesia    patient usually hs to be intubated with anesthesia   COPD (chronic obstructive pulmonary disease) (HCC)    PFT 06-26-09 FEV1  1.75( 71%), FVC 3.65( 101%), FEV1% 48, TLC 5.53(104%), DLCO 55%, no BD   Dyspnea    Hyperlipidemia    Hypertension    Nasal septal deviation    Nocturia    On home O2    2L N/C   OSA (obstructive sleep apnea) 04/21/2011   Partial small bowel obstruction (HCC) 10/18/2021   Skin cancer     Past Surgical History:  Procedure Laterality Date   APPENDECTOMY     BACK SURGERY     CATARACT EXTRACTION W/PHACO  01/06/2013   Procedure: CATARACT EXTRACTION PHACO AND INTRAOCULAR LENS PLACEMENT (IOC);  Surgeon: Cherene Mania, MD;  Location: [facility];  Service: Ophthalmology;  Laterality: Right;  CDE: 22.17   CATARACT EXTRACTION W/PHACO Left 06/20/2022   Procedure: CATARACT EXTRACTION PHACO AND INTRAOCULAR LENS PLACEMENT (IOC);  Surgeon: Harrie Agent, MD;  Location: [facility];  Service: Ophthalmology;  Laterality: Left;  CDE 20.85   FEMORAL HERNIA REPAIR Right 11/19/2022   Procedure: HERNIA REPAIR  FEMORAL WITH MESH;  Surgeon: Kallie Manuelita BROCKS, MD;  Location: [facility];  Service: General;  Laterality: Right;   INGUINAL HERNIA REPAIR Left 12/09/2021   Procedure: HERNIA REPAIR INGUINAL ADULT WITH MESH;  Surgeon: Kallie Manuelita BROCKS, MD;  Location: [facility];  Service: General;  Laterality: Left;   INGUINAL HERNIA REPAIR Right 03/19/2022   Procedure: HERNIA REPAIR INGUINAL ADULT WITH MESH;  Surgeon: Kallie Manuelita BROCKS, MD;  Location: [facility];  Service: General;  Laterality: Right;   ORIF PATELLA Left 01/22/2023   Procedure: OPEN REDUCTION INTERNAL FIXATION (ORIF) PATELLA;  Surgeon: Onesimo Oneil LABOR, MD;  Location: [facility];  Service: Orthopedics;  Laterality: Left;    Social History   Socioeconomic History   Marital status: Divorced    Spouse name: Not on file   Number of children: 3   Years of education: Not on file   Highest education level: Not on file  Occupational History   Occupation: Armed Forces Technical Officer: RETIRED  Tobacco Use   Smoking status: Former    Current packs/day: 0.00    Average packs/day: 1.5 packs/day for 54.0 years (81.0 ttl pk-yrs)    Types: Cigarettes    Start date: 12/02/1947    Quit date: 12/01/2001    Years since quitting: 23.0   Smokeless tobacco: Never  Vaping Use   Vaping status: Never Used  Substance and Sexual Activity   Alcohol use:  No   Drug use: No   Sexual activity: Not on file  Other Topics Concern   Not on file  Social History Narrative   Lives with sister.    Social Drivers of Health   Tobacco Use: Medium Risk (12/08/2024)   Patient History    Smoking Tobacco Use: Former    Smokeless Tobacco Use: Never    Passive Exposure: Not on file  Financial Resource Strain: Low Risk (02/07/2022)   Overall Financial Resource Strain (CARDIA)    Difficulty of Paying Living Expenses: Not hard at all  Food Insecurity: Low Risk (08/06/2023)   Received from [facility]   Epic    Within the past 12 months, you worried that your food would run out before you got  money to buy more: Never true    Within the past 12 months, the food you bought just didn't last and you didn't have money to get more. : Never true  Transportation Needs: No Transportation Needs (08/06/2023)   Received from [employer]    In the past 12 months, has lack of reliable transportation kept you from medical appointments, meetings, work or from getting things needed for daily living? : No  Physical Activity: Insufficiently Active (02/07/2022)   Exercise Vital Sign    Days of Exercise per Week: 3 days    Minutes of Exercise per Session: 20 min  Stress: No Stress Concern Present (02/07/2022)   [employer] of Occupational Health - Occupational Stress Questionnaire    Feeling of Stress : Not at all  Social Connections: Socially Isolated (02/07/2022)   Social Connection and Isolation Panel    Frequency of Communication with Friends and Family: More than three times a week    Frequency of Social Gatherings with Friends and Family: More than three times a week    Attends Religious Services: Never    Database Administrator or Organizations: No    Attends Banker Meetings: Never    Marital Status: Divorced  Catering Manager Violence: Not At Risk (01/20/2023)   Humiliation, Afraid, Rape, and Kick questionnaire    Fear of Current or Ex-Partner: No    Emotionally Abused: No    Physically Abused: No    Sexually Abused: No  Depression (PHQ2-9): Low Risk (03/30/2024)   Depression (PHQ2-9)    PHQ-2 Score: 0  Alcohol Screen: Low Risk (02/07/2022)   Alcohol Screen    Last Alcohol Screening Score (AUDIT): 0  Housing: Low Risk (08/06/2023)   Received from [facility]   Epic    What is your living situation today?: I have a steady place to live    Think about the place you live. Do you have problems with any of the following? Choose all that apply:: None/None on this list  Utilities: Low Risk (08/06/2023)   Received from [facility]   Utilities    In the  past 12 months has the electric, gas, oil, or water  company threatened to shut off services in your home? : No  Health Literacy: Not on file   Family History  Problem Relation Age of Onset   Lung cancer Mother       VITAL SIGNS BP 108/66   Pulse 73   Temp (!) 97.2 F (36.2 C)   Resp 20   Ht 5' 5.5 (1.664 m)   Wt 137 lb 9.6 oz (62.4 kg)   SpO2 97%   BMI 22.55 kg/m   Outpatient Encounter Medications as  of 12/30/2024  Medication Sig   Albuterol  Sulfate 2.5 MG/0.5ML NEBU Inhale 1 each into the lungs every 6 (six) hours as needed.   Budeson-Glycopyrrol-Formoterol  (BREZTRI  AEROSPHERE) 160-9-4.8 MCG/ACT AERO Inhale 2 puffs into the lungs in the morning and at bedtime.   cholecalciferol  (VITAMIN D ) 25 MCG (1000 UNIT) tablet Take 1,000 Units by mouth daily.   ciclopirox (LOPROX) 0.77 % cream Apply topically. Special Instructions: APPLY CREAM TO TOENAILS DAILY FOR SIX MONTHS. REMOVE WITH ALCOHOL WIPES ONCE WEEKLY. PER PODIATRY Once A Day 07:15 AM - 03:15 PM   Continuous Glucose Sensor (FREESTYLE LIBRE 2 SENSOR) MISC by Does not apply route. Once A Day on Tue Every 2 Weeks 03:15 PM - 11:15 PM   feeding supplement (ENSURE ENLIVE / ENSURE PLUS) LIQD Take 237 mLs by mouth 3 (three) times daily between meals.   ferrous sulfate 325 (65 FE) MG EC tablet Take 325 mg by mouth every Monday, Wednesday, and Friday.   finasteride  (PROSCAR ) 5 MG tablet Take 1 tablet (5 mg total) by mouth daily.   fluticasone (FLONASE) 50 MCG/ACT nasal spray Place 2 sprays into both nostrils at bedtime.   levothyroxine (SYNTHROID) 50 MCG tablet Take 50 mcg by mouth daily before breakfast.   Multiple Vitamins-Minerals (PRESERVISION AREDS 2) CAPS Take 1 capsule by mouth 2 (two) times daily.   OXYGEN  Inhale 2 L into the lungs as directed. Every Shift; Day, Evening, Night   solifenacin (VESICARE) 5 MG tablet Take 5 mg by mouth daily.   tamsulosin  (FLOMAX ) 0.4 MG CAPS capsule Take 2 capsules (0.8 mg total) by mouth daily.    UNABLE TO FIND DIET: DYS 2 MEATS / DYS 3 SIDES / Thin liquids GLUTEN FREE as of 06/10/23 07/01/23 Ground meats with soft vegetables as requested by resident. Does NOT want gravy.  No cranberry juice. Special Instructions: Only wants Turkey sausage or bacon for breakfast - no pork   No facility-administered encounter medications on file as of 12/30/2024.     SIGNIFICANT DIAGNOSTIC EXAMS  03-14-24: wbc 8.0; hgb 15.1; hct 46.2; mcv 88.8 plt 227; glucose 148; bun 22; creat 0.79;k + 4.0; na++ 139; ca 9.7; gfr >60; protein 6.8 albumin 3.3 hgb A1c 7.2   TODAY  09-15-24: wbc 10.5; hgb 14.7; hct 44.9; mcv 88.9 plt 271; glucose 130; bun 20; creat 0.91; k+ 4.4; na++ 136; ca 9.4 gfr >60; hgb A1c 6.8 11-26-24: glucose 75; bun 25; creat 0.80; k+ 3.9; na++ 139; ca 9.3 gfr >60       Review of Systems  Constitutional:  Negative for malaise/fatigue.  Respiratory:  Negative for cough and shortness of breath.   Cardiovascular:  Negative for chest pain, palpitations and leg swelling.  Gastrointestinal:  Negative for abdominal pain, constipation and heartburn.  Musculoskeletal:  Negative for back pain, joint pain and myalgias.  Skin: Negative.   Neurological:  Negative for dizziness.  Psychiatric/Behavioral:  The patient is not nervous/anxious.     Physical Exam Constitutional:      General: He is not in acute distress.    Appearance: He is well-developed. He is not diaphoretic.  Neck:     Thyroid : No thyromegaly.  Cardiovascular:     Rate and Rhythm: Normal rate and regular rhythm.     Heart sounds: Normal heart sounds.  Pulmonary:     Effort: Pulmonary effort is normal. No respiratory distress.     Breath sounds: Normal breath sounds.  Abdominal:     General: Bowel sounds are normal. There is no  distension.     Palpations: Abdomen is soft.     Tenderness: There is no abdominal tenderness.  Musculoskeletal:        General: Normal range of motion.     Cervical back: Neck supple.     Right  lower leg: No edema.     Left lower leg: No edema.  Lymphadenopathy:     Cervical: No cervical adenopathy.  Skin:    General: Skin is warm and dry.  Neurological:     Mental Status: He is alert. Mental status is at baseline.     Comments: SLUMS: 15/30  Psychiatric:        Mood and Affect: Mood normal.       ASSESSMENT/ PLAN:  TODAY  Aortic atherosclerosis Chronic obstructive pulmonary disease with emphysema unspecified emphysema type Controlled type 2 diabetes mellitus with hyperglycemia.   Will continue current medications Will continue current plan of care Will continue to monitor his status.   Time spent with patient: 35 minutes: medications; plan of care; activities.    Barnie Seip NP [facility]   call [phone]      [1]  Allergies Allergen Reactions   Benzyl Alcohol Itching   Amlodipine  Swelling    Swelling in feet   Aspirin  Other (See Comments)    Upset stomach.   Other Hypertension    Hazelnuts   Penicillins Hives    Has patient had a PCN reaction causing immediate rash, facial/tongue/throat swelling, SOB or lightheadedness with hypotension: Yes Has patient had a PCN reaction causing severe rash involving mucus membranes or skin necrosis: No Has patient had a PCN reaction that required hospitalization No Has patient had a PCN reaction occurring within the last 10 years: No If all of the above answers are NO, then may proceed with Cephalosporin use.    Sulfa Antibiotics Hives   Tylenol  [Acetaminophen ] Other (See Comments)    Pt reports he has had liver issues and was told not to take tylenol     "
